# Patient Record
Sex: Female | Born: 1945 | Race: Black or African American | Hispanic: No | Marital: Single | Smoking: Former smoker
Health system: Southern US, Community
[De-identification: ages and names within clinical notes are randomized; demographics above are authoritative.]

## PROBLEM LIST (undated history)

## (undated) DIAGNOSIS — N189 Chronic kidney disease, unspecified: Secondary | ICD-10-CM

## (undated) DIAGNOSIS — E785 Hyperlipidemia, unspecified: Secondary | ICD-10-CM

## (undated) DIAGNOSIS — IMO0001 Reserved for inherently not codable concepts without codable children: Secondary | ICD-10-CM

## (undated) DIAGNOSIS — Z9889 Other specified postprocedural states: Secondary | ICD-10-CM

## (undated) DIAGNOSIS — E0590 Thyrotoxicosis, unspecified without thyrotoxic crisis or storm: Secondary | ICD-10-CM

## (undated) DIAGNOSIS — R251 Tremor, unspecified: Secondary | ICD-10-CM

## (undated) DIAGNOSIS — Z8042 Family history of malignant neoplasm of prostate: Secondary | ICD-10-CM

## (undated) DIAGNOSIS — Z8041 Family history of malignant neoplasm of ovary: Secondary | ICD-10-CM

## (undated) DIAGNOSIS — J449 Chronic obstructive pulmonary disease, unspecified: Secondary | ICD-10-CM

## (undated) DIAGNOSIS — R112 Nausea with vomiting, unspecified: Secondary | ICD-10-CM

## (undated) DIAGNOSIS — M1990 Unspecified osteoarthritis, unspecified site: Secondary | ICD-10-CM

## (undated) DIAGNOSIS — C801 Malignant (primary) neoplasm, unspecified: Secondary | ICD-10-CM

## (undated) DIAGNOSIS — Z803 Family history of malignant neoplasm of breast: Secondary | ICD-10-CM

## (undated) DIAGNOSIS — I10 Essential (primary) hypertension: Secondary | ICD-10-CM

## (undated) DIAGNOSIS — J189 Pneumonia, unspecified organism: Secondary | ICD-10-CM

## (undated) HISTORY — PX: ROTATOR CUFF REPAIR: SHX139

## (undated) HISTORY — DX: Family history of malignant neoplasm of ovary: Z80.41

## (undated) HISTORY — PX: JOINT REPLACEMENT: SHX530

## (undated) HISTORY — PX: COLONOSCOPY: SHX174

## (undated) HISTORY — DX: Family history of malignant neoplasm of breast: Z80.3

## (undated) HISTORY — PX: KNEE SURGERY: SHX244

## (undated) HISTORY — DX: Chronic obstructive pulmonary disease, unspecified: J44.9

## (undated) HISTORY — DX: Thyrotoxicosis, unspecified without thyrotoxic crisis or storm: E05.90

## (undated) HISTORY — DX: Family history of malignant neoplasm of prostate: Z80.42

## (undated) HISTORY — PX: TOTAL HIP ARTHROPLASTY: SHX124

## (undated) HISTORY — PX: OTHER SURGICAL HISTORY: SHX169

---

## 2000-08-05 ENCOUNTER — Emergency Department (HOSPITAL_COMMUNITY): Admission: EM | Admit: 2000-08-05 | Discharge: 2000-08-05 | Payer: Self-pay | Admitting: *Deleted

## 2000-08-05 ENCOUNTER — Encounter: Payer: Self-pay | Admitting: Emergency Medicine

## 2000-08-23 ENCOUNTER — Ambulatory Visit (HOSPITAL_BASED_OUTPATIENT_CLINIC_OR_DEPARTMENT_OTHER): Admission: RE | Admit: 2000-08-23 | Discharge: 2000-08-23 | Payer: Self-pay | Admitting: Orthopedic Surgery

## 2000-10-25 ENCOUNTER — Ambulatory Visit (HOSPITAL_BASED_OUTPATIENT_CLINIC_OR_DEPARTMENT_OTHER): Admission: RE | Admit: 2000-10-25 | Discharge: 2000-10-25 | Payer: Self-pay | Admitting: Orthopedic Surgery

## 2000-12-18 ENCOUNTER — Other Ambulatory Visit: Admission: RE | Admit: 2000-12-18 | Discharge: 2000-12-18 | Payer: Self-pay | Admitting: Family Medicine

## 2000-12-24 ENCOUNTER — Encounter: Admission: RE | Admit: 2000-12-24 | Discharge: 2001-03-24 | Payer: Self-pay | Admitting: Orthopedic Surgery

## 2001-03-19 ENCOUNTER — Encounter: Payer: Self-pay | Admitting: Orthopedic Surgery

## 2001-03-21 ENCOUNTER — Inpatient Hospital Stay (HOSPITAL_COMMUNITY): Admission: RE | Admit: 2001-03-21 | Discharge: 2001-03-26 | Payer: Self-pay | Admitting: Orthopedic Surgery

## 2001-03-21 ENCOUNTER — Encounter: Payer: Self-pay | Admitting: Orthopedic Surgery

## 2001-05-03 ENCOUNTER — Encounter: Admission: RE | Admit: 2001-05-03 | Discharge: 2001-08-01 | Payer: Self-pay | Admitting: Orthopedic Surgery

## 2001-12-29 ENCOUNTER — Emergency Department (HOSPITAL_COMMUNITY): Admission: EM | Admit: 2001-12-29 | Discharge: 2001-12-29 | Payer: Self-pay | Admitting: Emergency Medicine

## 2002-05-29 ENCOUNTER — Ambulatory Visit (HOSPITAL_BASED_OUTPATIENT_CLINIC_OR_DEPARTMENT_OTHER): Admission: RE | Admit: 2002-05-29 | Discharge: 2002-05-30 | Payer: Self-pay | Admitting: Orthopedic Surgery

## 2002-07-18 ENCOUNTER — Encounter: Admission: RE | Admit: 2002-07-18 | Discharge: 2002-09-19 | Payer: Self-pay | Admitting: Orthopedic Surgery

## 2003-05-14 ENCOUNTER — Ambulatory Visit (HOSPITAL_BASED_OUTPATIENT_CLINIC_OR_DEPARTMENT_OTHER): Admission: RE | Admit: 2003-05-14 | Discharge: 2003-05-14 | Payer: Self-pay | Admitting: Orthopedic Surgery

## 2003-05-14 ENCOUNTER — Ambulatory Visit (HOSPITAL_COMMUNITY): Admission: RE | Admit: 2003-05-14 | Discharge: 2003-05-14 | Payer: Self-pay | Admitting: Orthopedic Surgery

## 2004-12-26 ENCOUNTER — Ambulatory Visit: Payer: Self-pay | Admitting: Gastroenterology

## 2005-01-06 ENCOUNTER — Ambulatory Visit: Payer: Self-pay | Admitting: Gastroenterology

## 2005-06-14 ENCOUNTER — Inpatient Hospital Stay (HOSPITAL_COMMUNITY): Admission: RE | Admit: 2005-06-14 | Discharge: 2005-06-19 | Payer: Self-pay | Admitting: Orthopedic Surgery

## 2005-07-18 ENCOUNTER — Encounter: Admission: RE | Admit: 2005-07-18 | Discharge: 2005-09-29 | Payer: Self-pay | Admitting: Orthopedic Surgery

## 2005-08-22 ENCOUNTER — Emergency Department (HOSPITAL_COMMUNITY): Admission: EM | Admit: 2005-08-22 | Discharge: 2005-08-23 | Payer: Self-pay | Admitting: Emergency Medicine

## 2007-08-12 ENCOUNTER — Emergency Department (HOSPITAL_COMMUNITY): Admission: EM | Admit: 2007-08-12 | Discharge: 2007-08-13 | Payer: Self-pay | Admitting: Emergency Medicine

## 2009-12-21 ENCOUNTER — Emergency Department (HOSPITAL_COMMUNITY): Admission: EM | Admit: 2009-12-21 | Discharge: 2009-12-21 | Payer: Self-pay | Admitting: Emergency Medicine

## 2010-05-22 ENCOUNTER — Encounter: Payer: Self-pay | Admitting: Family Medicine

## 2010-09-16 NOTE — Op Note (Signed)
NAME:  Catherine Lowery, REHFELDT NO.:  [id number]   MEDICAL RECORD NO.:  [id number]                   PATIENT TYPE:  AMB   LOCATION:  DSC                                  FACILITY:  [facility]   PHYSICIAN:  Loreta Ave, M.D.              DATE OF BIRTH:  [birth year]   DATE OF PROCEDURE:  05/29/2002  DATE OF DISCHARGE:                                 OPERATIVE REPORT   PREOPERATIVE DIAGNOSES:  Chronic impingement, degenerative joint disease  acromioclavicular joint and complete rotator cuff tear, right shoulder.   POSTOPERATIVE DIAGNOSES:  Chronic impingement, degenerative joint disease  acromioclavicular joint and complete rotator cuff tear, right shoulder.   OPERATION PERFORMED:  Right shoulder examination under anesthesia,  arthroscopy.  Acromioplasty with coracoacromial ligament release.  Excision  of distal clavicle.  Mini open repair rotator cuff tear with FibreWire  suturing and Concept repair system.   SURGEON:  Loreta Ave, M.D.   ASSISTANT:  Arlys John D. Petrarca, P.A.-C.   ANESTHESIA:  General.   ESTIMATED BLOOD LOSS:  Minimal.   SPECIMENS:  None.   CULTURES:  None.   COMPLICATIONS:  None.   DRESSING:  Soft compressive with shoulder immobilizer.   DESCRIPTION OF PROCEDURE:  The patient was brought to the operating room and  placed on the operating table in supine position.  After adequate anesthesia  had been obtained, the right shoulder examined.  Full motion, good  stability.  Placed in beach chair position on the shoulder positioner.  Prepped and draped in the usual sterile fashion.  Three standard  arthroscopic portals, anterior, posterior, lateral.  Shoulder entered with  blunt obturator, distended and inspected.  Some grade 2 changes on the  glenoid, none extreme.  The remaining articular cartilage, labrum, capsular  ligamentous structures intact.  Biceps tendon, biceps anchor intact.  Complete full thickness chronic tear,  supraspinatus, front to back.  About 1  cm retraction but very mobile and reparable.  Cannula redirected  subacromially.  Type 3 acromion.  Confirmation full thickness reparable  tear.  Acromioplasty to a type 1 acromion with shaver and a high speed bur  releasing CA ligament.  Distal clavicle marked spurring and grade 4 changes.  Lateral 1 cm resected.  Adequacy of decompression and clavicle incision  confirmed viewing from all portals.  Instruments and fluid removed.  Deltoid  splitting incision through the lateral portal exposing subacromial space.  Adequacy of decompression confirmed digitally.  Cuff was mobilized and well  captured with #2 FibreWire sutures times two.  Bony troughs created in the  humerus at the attachment site.  Sutures weaved through drill holes with the  Concept repair system and then firmly over a bony bridge.  A nice firm  watertight closure without undue tension even through full motion.  Wound  irrigated.  Deltoid closed with Vicryl.  Skin and subcutaneous tissue with  Vicryl.  Portals closed with  nylon.  Margins of the wound injected with  Marcaine as was the shoulder and subacromial space.  Sterile compressive  dressing with shoulder immobilizer applied.  Anesthesia reversed.  Brought  to recovery room.  Tolerated surgery well without complication.                                                Loreta Ave, M.D.    DFM/MEDQ  D:  05/29/2002  T:  05/29/2002  Job:  [id number]

## 2010-09-16 NOTE — Discharge Summary (Signed)
[place]. [facility]  Patient:    Catherine Lowery, HUEBSCH Visit Number: [id number] MRN: [id number]          Service Type: SUR Location: [id number] Attending Physician:  Colbert Ewing Dictated by:   Oris Drone Petrarca, P.A.-C. 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  DISCHARGE DIAGNOSES: 1. 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 2. Hypokalemia. 3. Postoperative hemorrhagic anemia. 4. Cigarette smoker.  PROCEDURE:  Left total knee replacement.  HISTORY OF PRESENT ILLNESS:  A 65 year old female with persistent pain in her left knee.  She has been unable to work and perform activities of daily living secondary to her pain.  She now has pain with every step and nighttime pain. She has failed conservative treatment.  She is now indicated for a total knee replacement.  HOSPITAL COURSE:  A 65 year old female admitted March 21, 2001, and after appropriate laboratory studies were obtained as well as 1 g Ancef IV on call to the operating room, was taken to the operating room where she underwent a left total knee replacement.  She tolerated the procedure well.  She was continued with Ancef 1 g IV q.8h. x 3 doses.  Heparin 5000 units subcu q.12h. was begun until her Coumadin became therapeutic.  Consultations with PT and OT were obtained.  Total knee replacement protocol procedures were instituted. She was allowed to ambulate weightbearing as tolerated on the left. Apparently she had difficulty with the epidural and had pump tubing connection had disconnected.  This was then discontinued.  She was placed on Dilaudid PCA pump.  She was allowed out of bed to a chair the following day.  Hemovacs were pulled postop day #2.  She had hypokalemia which was treated wit h K-Dur 20 mEq p.o. b.i.d.  She became ambulatory,  weightbearing as tolerated as per physical therapy.  Once she was ambulatory and stable, she was discharged on March 26, 2001, to return back to the office in seven to 10 days for follow-up.  LABORATORY STUDIES:  March 19, 2001, hemoglobin 13.1, hematocrit 40.1%, white count 5300, platelets 253,000.  Labs of March 26, 2001, hemoglobin 8.4, hematocrit 25.6%, white count 6800, platelets 264,000.  Chemistries of March 19, 2001, revealed sodium 137, potassium 4.1, chloride 104, CO2 28, glucose 89, BUN 8, creatinine 0.7, calcium 9.2, total protein 7.3, albumin 4.1, AST 19, ALT 14, ALP 88, total bilirubin 0.8.  Chemistries of March 26, 2001, revealed sodium 137, potassium 4.3, chloride 103, CO2 27, glucose 105, BUN 7, creatinine 0.7, and calcium 8.5.  Urinalysis was benign for voided urine. She is blood type O positive, antibody screen negative.  DISCHARGE INSTRUCTIONS:  She was discharged in good condition.  She was given a prescription for Coumadin 5 mg as directed by pharmacy at [place].  Percocet one to two q.4h. p.r.n. pain.  Iron sulfate 325 mg one p.o. b.i.d. with meals. Activities as tolerated, ambulating weightbearing as tolerated.  No restrictions on diet.  Keep wound clean and dry.  Call if she has any problems with signs of infection, temperature, or any other difficulties.  She will otherwise follow-up in the office in seven to 10 days for follow-up.  She is discharged in improved condition. Dictated by:   Oris Drone Petrarca,  P.A.-C. Attending Physician:  Colbert Ewing DD:  04/04/01 TD:  04/04/01 Job: [id number]

## 2010-09-16 NOTE — Op Note (Signed)
[name], Lowery NO.:  [id number]   MEDICAL RECORD NO.:  [id number]          PATIENT TYPE:  INP   LOCATION:  2550                         FACILITY:  [facility]   PHYSICIAN:  Loreta Ave, M.D. DATE OF BIRTH:  Sep 23, 1945   DATE OF PROCEDURE:  06/14/2005  DATE OF DISCHARGE:                                 OPERATIVE REPORT   PREOPERATIVE DIAGNOSIS:  End stage degenerative arthritis, right knee with  valgus alignment.   POSTOPERATIVE DIAGNOSIS:  End stage degenerative arthritis, right knee with  valgus alignment.   OPERATION PERFORMED:  Right total knee replacement, [employer] prosthesis.  Minimally invasive system.  Cemented pegged #5  posterior stabilized femoral component.  Cemented #5 tibial component with  11 mm posterior stabilized polyethylene insert.  Cemented resurfacing medial  offset 35 mm x 10 mm patellar component.   SURGEON:  Loreta Ave, M.D.   ASSISTANT:  Genene Churn. Denton Meek.   ANESTHESIA:  General.   ESTIMATED BLOOD LOSS:  Minimal.   TOURNIQUET TIME:  One hour and 40 minutes.   SPECIMENS:  None.   CULTURES:  None.   COMPLICATIONS:  None.   DRESSING:  Soft compressive with knee immobilizer.   DRAINS:  Hemovac x 1.   DESCRIPTION OF PROCEDURE:  The patient was brought to the operating room and  after adequate anesthesia had been obtained, the left knee examined.  Minimal flexion contracture.  Increased valgus correctable to reasonable  mechanical axis.  Stable ligaments.  Flexion to greater than 100 degrees.  Tourniquet applied.  Leg prepped and draped in the usual sterile fashion.  Exsanguinated with elevation and Esmarch.  Tourniquet inflated to 300 mmHg.  Straight incision above the patella down to the tibial tubercle.  Medial  arthrotomy to the top of the patella and then a vastus splitting approach  for the MIS instruments.  Knee exposed.  Marked grade 4 changes throughout.  Periarticular spurs, loose  bodies, remnants of menisci, cruciate ligaments  excised.  Distal femur exposed.  Intramedullary guide placed.  Distal cut 10  mm.  Set at 5 degrees of valgus.  Obviously more resection  medially as she  is deficient laterally.  Sized for #5 component.  Jigs put in place,  definitive cuts made.  Trial put in place, found to fit well with a nice  contour anteriorly and good coverage medial to lateral.  Attention turned to  tibia.  Extramedullary guide.  Proximal cut 3 degree posterior slope cut,  removing about 2 mm off the deficient lateral side.  Sized for #5 component.  Trials put in place,  #5 on the tibia, #5 on the femur, both fit well.  Trials removed.  In the posterolateral aspect of the knee there was a large  __________  with extensive spurring creating posterior impingement.  There  was carefully shelled out and excised.  All posterior spurs removed.  Patella was then sized for a 35 mm resurfacing component.  The posterior 10  mm resected.  Drill holes made for the definitive component.  Trials put in  place throughout.  #  5 on the femur, #5 on the tibia and a 35 mm on the  patella.  With the 11 mm insert after soft tissue balancing, I had a nicely  balanced knee with full extension, full flexion, no lift off in flexion and  good stability throughout.  Tibia was marked for appropriate rotation and  then hand reamed.  All instruments removed.  Copious irrigation with the  pulse irrigating device.  Cement placed on all components which were then  all firmly seated.  Polyethylene was attached to the tibia.  Knee reduced.  Once the cement hardened, the knee was re-examined.  Full extension, full  flexion, good patellofemoral tracking, good stability.  Wound irrigated.  Hemovac placed, brought out through separate stab  wound.  Arthrotomy closed  with #1 Vicryl.  Skin and subcutaneous tissue with Vicryl and staples.  Margins of the wound and knee injected with Marcaine.  Hemovac which  had  been placed was clamped.  Sterile compressive dressing applied.  Tourniquet  deflated and removed.  Knee immobilizer applied.  Anesthesia reversed.  Brought to recovery room.  Tolerated surgery well.  No complications.      Loreta Ave, M.D.  Electronically Signed     DFM/MEDQ  D:  06/14/2005  T:  06/14/2005  Job:  [id number]

## 2010-09-16 NOTE — Op Note (Signed)
[place]. [facility]  Patient:    Catherine Lowery, Catherine Lowery                       MRN: [id number] Proc. Date: 10/25/00 Adm. Date:  [id number] Attending:  Colbert Ewing                           Operative Report  PREOPERATIVE DIAGNOSIS:  Painful posterior superior os calcis spurs left heel with pre-Achilles bursitis and attachment spurs, Achilles tendon.  POSTOPERATIVE DIAGNOSIS:  Painful posterior superior os calcis spurs left heel with pre-Achilles bursitis and attachment spurs, Achilles tendon.  OPERATION PERFORMED:  Excision posterior superior os calcis spurs and pre-Achilles bursa.  Debridement of attachment spurs to Achilles tendon.  SURGEON:  Loreta Ave, M.D.  ASSISTANT:  Arlys John D. Petrarca, P.A.-C.  ANESTHESIA:  General.  ESTIMATED BLOOD LOSS:  Minimal.  TOURNIQUET TIME:  45 minutes.  SPECIMENS:  None.  CULTURES:  None.  COMPLICATIONS:  None.  DRESSING:  Soft compressive with bulky dressing and short leg splint.  DESCRIPTION OF PROCEDURE:  The patient was brought to the operating room and after adequate anesthesia had been obtained, turned to a prone position with a calf tourniquet applied.  Prepped and draped in the usual sterile fashion. Longitudinal incision lateral aspect Achilles tendon down to the lateral border of the os calcis.  Skin and subcutaneous tissues divided.  Pre-Achilles region accessed with an obvious swollen bursa with bursal fluid.  Bursa resected.  Prominent posterior superior os calcis spurs all debrided off and tapered down to a nice even margin confirmed fluoroscopically.  I then went into the subcutaneous plane to the back of the tendon which was opened longitudinally in the middle third.  The spurring within the attachment of the tendon was then excised.  Able to maintain more than two thirds of the tendon attachment and this was closed longitudinally over the site of spur excision with Vicryl.  Wound  irrigated.  Deep tissue closed with Vicryl, skin with nylon.  Margins of the wound injected with Marcaine.  Sterile compressive dressing and short leg splint applied.  Anesthesia reversed.  Brought to recovery room.  Tolerated surgery well.  No complications. DD:  10/25/00 TD:  10/25/00 Job: [id number]

## 2010-09-16 NOTE — Op Note (Signed)
NAMESUPRIYA, BEASTON NO.:  [id number]   MEDICAL RECORD NO.:  [id number]                   PATIENT TYPE:  AMB   LOCATION:  DSC                                  FACILITY:  [facility]   PHYSICIAN:  Loreta Ave, M.D.              DATE OF BIRTH:  02-12-1946   DATE OF PROCEDURE:  05/14/2003  DATE OF DISCHARGE:                                 OPERATIVE REPORT   PREOPERATIVE DIAGNOSIS:  Right knee diffuse tricompartmental degenerative  arthritis with degenerative medial and lateral meniscal tears, loose bodies,  and synovitis.   POSTOPERATIVE DIAGNOSIS:  Right knee diffuse tricompartmental degenerative  arthritis with degenerative medial and lateral meniscal tears, loose bodies,  and synovitis, with diffuse grade 3 chondromalacia all compartments and some  grade 4 changes lateral trochlea.  Two osteochondral loose bodies and  numerous chondral loose bodies.   OPERATIVE PROCEDURE:  Right knee exam under anesthesia, arthroscopy,  chondroplasty all three compartments including patellofemoral joint, removal  loose bodies, extensive partial medial and lateral meniscectomy.   SURGEON:  Loreta Ave, M.D.   ASSISTANT:  Arlys John D. Petrarca, P.A.-C.   ANESTHESIA:  General.   ESTIMATED BLOOD LOSS:  Minimal.   TOURNIQUET TIME:  Not employed.   SPECIMENS:  None.   CULTURES:  None.   COMPLICATIONS:  None.   DRESSINGS:  Soft compressive.   PROCEDURE:  The patient was brought to the operating room and placed on the  operating table in supine position.  After adequate anesthesia had been  obtained, the right knee was examined.  There was some patellofemoral  crepitus but reasonable motion, just about full extension and flexion and  stable ligaments.  Tourniquet and leg holder applied.  The leg was prepped  and draped.  Three portals were created, one superolateral and one medial  and lateral parapatellar.  Inflow catheter introduced, arthroscope  introduced, knee inspected.  Reactive synovitis throughout debrided.  The  patellofemoral joint had reasonable tracking.  Grade 3 changes throughout  treated with chondroplasty.  Some grade 4 on the trochlea.  Lateral release  not indicated.  One 1 cm osteochondral loose body found on the notch  removed.  Another slightly smaller one found in the lateral compartment  removed.  Cruciate ligament intact.  Some attenuation of the ACL, but still  functional.  PCL intact.  Medial compartment with diffuse grade 3 changes  treated with chondroplasty.  Marked complex tearing of the medial meniscus  over the posterior half taken down to a stable rim salvaging some.  The  lateral meniscus has circumferential complex displaced tears that were  saucerized out leaving a little bit of the rim of the meniscus all the way  around at the completion.  At completion, all areas examined, all loose  bodies and all uneven surfaces debrided.  The instruments and fluid were  removed.  The portals of the knee were injected with  Marcaine.  The portals  were closed with 4-0 nylon.  Sterile compressive dressing applied.  Anesthesia reversed.  Brought to the recovery room.  Tolerated the surgery  well without complications.                                               Loreta Ave, M.D.    DFM/MEDQ  D:  05/14/2003  T:  05/14/2003  Job:  [id number]

## 2010-09-16 NOTE — Op Note (Signed)
[place]. [facility]  Patient:    Catherine Lowery, Catherine Lowery                       MRN: [id number] Proc. Date: 08/23/00 Adm. Date:  [id number] Attending:  Colbert Ewing                           Operative Report  PREOPERATIVE DIAGNOSES:  Progressive degenerative arthritis and degenerative meniscal tears left knee.  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  Extensive degenerative changes medial and lateral meniscus with reactive synovitis.  PROCEDURE:  Left knee examination under anesthesia arthroscopy, with partial medial and lateral meniscectomies.  Generalized chondroplasty, partial synovectomy.  SURGEON:  Loreta Ave, M.D.  ANESTHESIA:  General.  ESTIMATED BLOOD LOSS:  Minimal.  SPECIMENS:  None.  CULTURES:  None.  COMPLICATIONS:  None.  DRESSINGS:  Sterile compressive.  PROCEDURE:  The patient brought to the operating room and placed on the operating room table in supine position.  After adequate anesthesia had been obtained the left knee examined.  Good motion, good stability, good patellofemoral tracking after lateral release.  Tourniquet and leg holder applied.  Leg prepped and draped in usual sterile fashion.  Three portals created, one superolateral, one each medial and lateral parapatellar.  Inflow catheter introduced, knee distended, arthroscope introduced, knee inspected.  Extensive grade 3 and 4 chondral changes all 3 compartments.  Treated with chondroplasty throughout smoothing off and evening off on even articular cartilage surfaces.  A third of the patella grade 4.  A good third of the medial and lateral compartments also down to grade 4 with extensive grade 3 changes.  Extensive degenerative tearing posterior half medial meniscus which was saucerized out to a stable rim, taped to remaining  meniscus obviously in the anterior half.  Lateral meniscus had already had the posterior half removed and the anterior half had extensive degenerative tearing and this was removed.  Hypertrophic synovitis throughout the knee, all debrided.  Chondral lose bodies removed.  Cruciate ligaments had some attrition but were still intact.  Entire knee examined, no other significant findings appreciated. Patellofemoral tracking felt to be acceptable after previous release. Instruments and fluid removed.  Portals of the knee injected with Marcaine. Portals closed with 4-0 nylon.  Sterile compressive dressing applied. Anesthesia reversed and brought to recovery room.  Tolerated surgery well, no complications. DD:  08/23/00 TD:  08/24/00 Job: [id number]

## 2010-09-16 NOTE — Op Note (Signed)
[place]. [facility]  Patient:    Catherine Lowery Visit Number: [id number] MRN: [id number]          Service Type: SUR Location: 5000 [id number] Attending Physician:  Catherine Lowery Dictated by:   Catherine Lowery, M.D. Proc. Date: 03/21/01 Admit Date:  03/21/2001                             Operative Report  PREOPERATIVE DIAGNOSIS:  End stage degenerative arthritis, left knee.  POSTOPERATIVE DIAGNOSIS:  End stage degenerative arthritis, left knee.  OPERATION PERFORMED:  Left total knee replacement, [employer] prosthesis. Press-fit #9 cruciate retaining femoral component.  Cemented #9 tibial component with 10 mm polyethylene insert.  Cemented recessed nonmetalbacked 28 mm patellar component.  Appropriate soft tissue balancing with lateral retinacular release.  SURGEON:  Catherine Lowery, M.D.  ASSISTANT:  Catherine Lowery, P.A.-C.  ANESTHESIA:  General.  ESTIMATED BLOOD LOSS:  Minimal.  TOURNIQUET TIME:  One hour 10 minutes.  SPECIMENS:  Excised bone and soft tissue.  CULTURES:  None.  COMPLICATIONS:  None.  DRESSING:  Soft compressive with knee immobilizer.  DESCRIPTION OF PROCEDURE:  The patient was brought to the operating room and placed on the operating table in supine position.  After adequate anesthesia had been obtained, tourniquet applied to upper aspect of the left leg. prepped and draped in the usual sterile fashion.  Exsanguinated with elevation and Esmarch.  Tourniquet inflated to 350 mmHg.  Alignment in slight increased valgus.  Straight ligaments.  Full extension about 95 degrees of flexion. Straight incision above the patella down to the tibial tubercle.  The skin and subcutaneous tissues were divided.  Medial parapatellar arthrotomy with hemostasis obtained with cautery.  Knee exposed, patella everted.  Remnants of menisci excised.  Grade 4 changes throughout, especially laterally.  PCL retained as it was not  contracted.  ACL resected.  Articular spurs removed. Intramedullary guide distal femur.  Distal cuts set at 5 degrees of valgus resecting 12 mm.  Sized to #9 component.  Jigs put in place.  Definitive cuts made.  Trial put in place and found to fit well.  Trial removed.  Tibia exposed.  Tibial spines removed with a saw protecting PCL.  Intramedullary guide placed.  Proximal cut removing 6 mm with a posterior slope cut. Periarticular spurs and loose bodies removed.  Trials put in place with the #9 component above and below.  With a 10 mm insert, full extension, full flexion, nicely balanced knee and no lift-off with flexion.  Marked for rotation and then the tibia hand reamed.  Patella sized, reamed and drilled for a 28 mm component.  All trials put in place.  Good motion and stability.  Lateral release necessary to balance patellofemoral joint.  Performed with cautery from inside out.  All trials removed.  Copious irrigation with the pulse irrigating device.  Cement prepared and placed on tibial and patellar components which were both well-seated with excessive cement removed. Polyethylene attached to the tibia.  Femoral component seated.  Knee reduced. After the cement had hardened, the knee was re-examined.  Good motion, good stability, full extension, full flexion set at 5 degrees of valgus and good patellofemoral tracking.  Hemovacs placed and brought out through separate stab wounds.  Arthrotomy closed with #1 Vicryl.  Skin and subcutaneous tissues with Vicryl and staples.  Margins of the wound and knee injected with Marcaine and Hemovacs clamped.  Sterile compressive dressing applied.  Tourniquet inflated and removed.  Anesthesia reversed.  Brought to recovery room. Tolerated surgery well.  No complications. Skin and subcutaneous tissues with Vicryl.  Portals closed with nylon. Margins of wound injected Marcaine.  Sterile compressive dressing and shoulder immobilizer applied.   Anesthesia reversed.  Brought to recovery room. Tolerated surgery well.  No complications. Dictated by:   Catherine Lowery, M.D. Attending Physician:  Catherine Lowery DD:  03/21/01 TD:  03/22/01 Job: [id number]

## 2011-03-24 ENCOUNTER — Encounter: Payer: Self-pay | Admitting: Emergency Medicine

## 2011-03-24 ENCOUNTER — Emergency Department (HOSPITAL_COMMUNITY): Payer: Medicare Other

## 2011-03-24 ENCOUNTER — Emergency Department (HOSPITAL_COMMUNITY)
Admission: EM | Admit: 2011-03-24 | Discharge: 2011-03-24 | Disposition: A | Discharge status: Home / Self Care | Payer: Medicare Other | Attending: Emergency Medicine | Admitting: Emergency Medicine

## 2011-03-24 DIAGNOSIS — R0602 Shortness of breath: Secondary | ICD-10-CM | POA: Insufficient documentation

## 2011-03-24 DIAGNOSIS — R6883 Chills (without fever): Secondary | ICD-10-CM | POA: Insufficient documentation

## 2011-03-24 DIAGNOSIS — N390 Urinary tract infection, site not specified: Secondary | ICD-10-CM | POA: Insufficient documentation

## 2011-03-24 DIAGNOSIS — Z79899 Other long term (current) drug therapy: Secondary | ICD-10-CM | POA: Insufficient documentation

## 2011-03-24 DIAGNOSIS — J40 Bronchitis, not specified as acute or chronic: Secondary | ICD-10-CM | POA: Insufficient documentation

## 2011-03-24 DIAGNOSIS — R51 Headache: Secondary | ICD-10-CM | POA: Insufficient documentation

## 2011-03-24 DIAGNOSIS — R05 Cough: Secondary | ICD-10-CM | POA: Insufficient documentation

## 2011-03-24 DIAGNOSIS — I10 Essential (primary) hypertension: Secondary | ICD-10-CM | POA: Insufficient documentation

## 2011-03-24 DIAGNOSIS — R059 Cough, unspecified: Secondary | ICD-10-CM | POA: Insufficient documentation

## 2011-03-24 DIAGNOSIS — R070 Pain in throat: Secondary | ICD-10-CM | POA: Insufficient documentation

## 2011-03-24 DIAGNOSIS — J3489 Other specified disorders of nose and nasal sinuses: Secondary | ICD-10-CM | POA: Insufficient documentation

## 2011-03-24 DIAGNOSIS — IMO0001 Reserved for inherently not codable concepts without codable children: Secondary | ICD-10-CM | POA: Insufficient documentation

## 2011-03-24 DIAGNOSIS — M2550 Pain in unspecified joint: Secondary | ICD-10-CM | POA: Insufficient documentation

## 2011-03-24 DIAGNOSIS — R079 Chest pain, unspecified: Secondary | ICD-10-CM | POA: Insufficient documentation

## 2011-03-24 DIAGNOSIS — Z7982 Long term (current) use of aspirin: Secondary | ICD-10-CM | POA: Insufficient documentation

## 2011-03-24 HISTORY — DX: Essential (primary) hypertension: I10

## 2011-03-24 HISTORY — DX: Tremor, unspecified: R25.1

## 2011-03-24 LAB — URINALYSIS, ROUTINE W REFLEX MICROSCOPIC
Ketones, ur: NEGATIVE mg/dL
Protein, ur: NEGATIVE mg/dL
Specific Gravity, Urine: 1.019 (ref 1.005–1.030)
Urobilinogen, UA: 0.2 mg/dL (ref 0.0–1.0)

## 2011-03-24 LAB — URINE MICROSCOPIC-ADD ON

## 2011-03-24 MED ORDER — ALBUTEROL SULFATE HFA 108 (90 BASE) MCG/ACT IN AERS
2.0000 | INHALATION_SPRAY | Freq: Once | RESPIRATORY_TRACT | Status: AC
  Administered 2011-03-24: 2 via RESPIRATORY_TRACT
  Filled 2011-03-24: qty 6.7

## 2011-03-24 MED ORDER — CIPROFLOXACIN HCL 500 MG PO TABS: 500.0000 mg | ORAL_TABLET | Freq: Two times a day (BID) | ORAL | Status: AC

## 2011-03-24 MED ORDER — HYDROCOD POLST-CHLORPHEN POLST 10-8 MG/5ML PO LQCR: 5.0000 mL | Freq: Two times a day (BID) | ORAL | Status: DC | PRN

## 2011-03-24 NOTE — ED Provider Notes (Signed)
Medical screening examination/treatment/procedure(s) were conducted as a shared visit with non-physician practitioner(s) and myself.  I personally evaluated the patient during the encounter   Patient seen and examined. Patient to be treated for her urinary tract infection and followup with her Dr. as needed  Catherine Baker, MD 03/24/11 1140

## 2011-03-24 NOTE — ED Notes (Signed)
Pt here c/o generalized body aches, HA, hip pain, sinus congestion, and SOB

## 2011-03-24 NOTE — ED Provider Notes (Signed)
History     CSN: [id number] Arrival date & time: 03/24/2011  8:14 AM   First MD Initiated Contact with Patient 03/24/11 [phone]      Chief Complaint  Patient presents with  . Generalized Body Aches  . Headache    (Consider location/radiation/quality/duration/timing/severity/associated sxs/prior treatment) Patient is a 65 y.o. female presenting with URI. The history is provided by the patient.  URI The primary symptoms include headaches, sore throat, cough, myalgias and arthralgias. Primary symptoms do not include fever, ear pain, abdominal pain, nausea, vomiting or rash. The current episode started 3 to 5 days ago. This is a new problem. The problem has not changed since onset. Symptoms associated with the illness include chills, plugged ear sensation, sinus pressure, congestion and rhinorrhea. The illness is not associated with facial pain.  Pt states she has had nasal congestion, sore throat, cough for the last 4 days. States also having headaches. States tried over the counter cold and flu medications with no relief. Denies fever, chest pain. States now feeling short of breath. States is having generalized body aches, especially in her hips. No other complaints.   Past Medical History  Diagnosis Date  . Hypertension   . Tremors of nervous system     Past Surgical History  Procedure Date  . Joint replacement     No family history on file.  History  Substance Use Topics  . Smoking status: Not on file  . Smokeless tobacco: Not on file  . Alcohol Use:     OB History    Grav Para Term Preterm Abortions TAB SAB Ect Mult Living                  Review of Systems  Constitutional: Positive for chills. Negative for fever.  HENT: Positive for congestion, sore throat, rhinorrhea and sinus pressure. Negative for ear pain.   Eyes: Negative.   Respiratory: Positive for cough and shortness of breath. Negative for chest tightness.   Cardiovascular: Negative for chest pain,  palpitations and leg swelling.  Gastrointestinal: Negative for nausea, vomiting and abdominal pain.  Genitourinary: Negative.   Musculoskeletal: Positive for myalgias and arthralgias.  Skin: Negative for rash.  Neurological: Positive for headaches.  Psychiatric/Behavioral: Negative.     Allergies  Morphine and related  Home Medications   Current Outpatient Rx  Name Route Sig Dispense Refill  . ASPIRIN 81 MG PO TABS Oral Take 81 mg by mouth daily.      Marland Kitchen PROPRANOLOL HCL 80 MG PO TABS Oral Take 80 mg by mouth 2 (two) times daily.        There were no vitals taken for this visit.  Physical Exam  Constitutional: She is oriented to person, place, and time. She appears well-developed and well-nourished. No distress.  HENT:  Head: Normocephalic and atraumatic.  Right Ear: Tympanic membrane, external ear and ear canal normal.  Left Ear: Tympanic membrane, external ear and ear canal normal.  Nose: Rhinorrhea present. No mucosal edema.  Mouth/Throat: Uvula is midline, oropharynx is clear and moist and mucous membranes are normal. No oropharyngeal exudate, posterior oropharyngeal edema or posterior oropharyngeal erythema.  Eyes: Pupils are equal, round, and reactive to light.  Neck: Neck supple.  Cardiovascular: Normal rate, regular rhythm and normal heart sounds.   Pulmonary/Chest: Effort normal and breath sounds normal. No respiratory distress. She has no wheezes.  Abdominal: Soft. Bowel sounds are normal.  Musculoskeletal: She exhibits no edema.  Neurological: She is alert and oriented to person,  place, and time.  Skin: Skin is warm and dry. No erythema.  Psychiatric: She has a normal mood and affect.    ED Course  Procedures (including critical care time)  Results for orders placed during the hospital encounter of 03/24/11  URINE CULTURE      Component Value Range   Specimen Description URINE, CLEAN CATCH     Special Requests NONE     Setup Time [id number]     Colony  Count 2,000 COLONIES/ML     Culture INSIGNIFICANT GROWTH     Report Status 03/25/2011 FINAL    URINALYSIS, ROUTINE W REFLEX MICROSCOPIC      Component Value Range   Color, Urine YELLOW  YELLOW    Appearance HAZY (*) CLEAR    Specific Gravity, Urine 1.019  1.005 - 1.030    pH 5.5  5.0 - 8.0    Glucose, UA NEGATIVE  NEGATIVE (mg/dL)   Hgb urine dipstick MODERATE (*) NEGATIVE    Bilirubin Urine SMALL (*) NEGATIVE    Ketones, ur NEGATIVE  NEGATIVE (mg/dL)   Protein, ur NEGATIVE  NEGATIVE (mg/dL)   Urobilinogen, UA 0.2  0.0 - 1.0 (mg/dL)   Nitrite NEGATIVE  NEGATIVE    Leukocytes, UA SMALL (*) NEGATIVE   URINE MICROSCOPIC-ADD ON      Component Value Range   Squamous Epithelial / LPF FEW (*) RARE    WBC, UA 3-6  <3 (WBC/hpf)   RBC / HPF 7-10  <3 (RBC/hpf)   Bacteria, UA FEW (*) RARE    Dg Chest 2 View  03/24/2011  *RADIOLOGY REPORT*  Clinical Data: Cough.  Chest pain.  CHEST - 2 VIEW  Comparison: PA and lateral chest 08/22/2005.  Findings: The lungs are clear.  There is no pneumothorax or pleural effusion.  Heart size is normal.  No focal bony abnormality with spondylosis noted.  IMPRESSION: No acute disease.  Original Report Authenticated By: Bernadene Bell. Maricela Curet, M.D.    Pt in NAD. CXR negative. URI symptoms. VS stable. UA possibly infected. Will treat. Pt wanting to go home. Will d/c home with close follow up     MDM          Lottie Mussel, PA 03/25/11 1623

## 2011-03-25 LAB — URINE CULTURE: Colony Count: 2000

## 2011-03-28 NOTE — ED Provider Notes (Signed)
Medical screening examination/treatment/procedure(s) were performed by non-physician practitioner and as supervising physician I was immediately available for consultation/collaboration.  Toy Baker, MD 03/28/11 2138

## 2012-01-24 ENCOUNTER — Encounter (HOSPITAL_COMMUNITY): Payer: Self-pay | Admitting: Emergency Medicine

## 2012-01-24 ENCOUNTER — Emergency Department (HOSPITAL_COMMUNITY)
Admission: EM | Admit: 2012-01-24 | Discharge: 2012-01-24 | Disposition: A | Discharge status: Home / Self Care | Payer: Medicare Other | Attending: Emergency Medicine | Admitting: Emergency Medicine

## 2012-01-24 DIAGNOSIS — L0291 Cutaneous abscess, unspecified: Secondary | ICD-10-CM

## 2012-01-24 DIAGNOSIS — IMO0002 Reserved for concepts with insufficient information to code with codable children: Secondary | ICD-10-CM | POA: Insufficient documentation

## 2012-01-24 DIAGNOSIS — Z7982 Long term (current) use of aspirin: Secondary | ICD-10-CM | POA: Insufficient documentation

## 2012-01-24 DIAGNOSIS — F17200 Nicotine dependence, unspecified, uncomplicated: Secondary | ICD-10-CM | POA: Insufficient documentation

## 2012-01-24 DIAGNOSIS — I10 Essential (primary) hypertension: Secondary | ICD-10-CM | POA: Insufficient documentation

## 2012-01-24 DIAGNOSIS — Z885 Allergy status to narcotic agent status: Secondary | ICD-10-CM | POA: Insufficient documentation

## 2012-01-24 MED ORDER — SULFAMETHOXAZOLE-TRIMETHOPRIM 800-160 MG PO TABS: 1.0000 | ORAL_TABLET | Freq: Two times a day (BID) | ORAL | Status: DC

## 2012-01-24 NOTE — ED Provider Notes (Signed)
History     CSN: [id number]  Arrival date & time 01/24/12  1736   First MD Initiated Contact with Patient 01/24/12 1842      Chief Complaint  Patient presents with  . Abscess    (Consider location/radiation/quality/duration/timing/severity/associated sxs/prior treatment) HPI Comments: Patient with history of recurrent abscesses presents with complaint of multiple abscesses located in her left axilla and shoulder. They first appeared on Sunday (3 days ago) and have been increasing in size since then. The abscess on her left shoulder has been irritated by her bra strap prompting her to put neosporin and a band-aid on it. The neosporin has not seemed to help.The abscess has been draining. No drainage of the abscesses in axilla. Patient denies fever, nausea, vomiting, diarrhea. Onset gradual. Course constant. Nothing makes symptoms better.   Patient is a 66 y.o. female presenting with abscess. The history is provided by the patient.  Abscess  Pertinent negatives include no fever, no diarrhea and no vomiting.    Past Medical History  Diagnosis Date  . Hypertension   . Tremors of nervous system     Past Surgical History  Procedure Date  . Joint replacement     History reviewed. No pertinent family history.  History  Substance Use Topics  . Smoking status: Current Every Day Smoker -- 0.5 packs/day  . Smokeless tobacco: Not on file  . Alcohol Use: No    OB History    Grav Para Term Preterm Abortions TAB SAB Ect Mult Living                  Review of Systems  Constitutional: Negative for fever.  Gastrointestinal: Negative for nausea, vomiting and diarrhea.  Skin: Positive for color change. Negative for wound.       Positive for abscess.  Hematological: Negative for adenopathy.    Allergies  Morphine and related  Home Medications   Current Outpatient Rx  Name Route Sig Dispense Refill  . ASPIRIN 325 MG PO TABS Oral Take 325 mg by mouth daily.    .  BUDESONIDE-FORMOTEROL FUMARATE 160-4.5 MCG/ACT IN AERO Inhalation Inhale 2 puffs into the lungs at bedtime.    Marland Kitchen PROPRANOLOL HCL 80 MG PO TABS Oral Take 80 mg by mouth 2 (two) times daily.      Marland Kitchen VITAMIN D (ERGOCALCIFEROL) 50000 UNITS PO CAPS Oral Take 50,000 Units by mouth every 7 (seven) days. sundays      BP 153/65  Pulse 60  Temp 98.2 F (36.8 C)  SpO2 97%  LMP 01/24/2000  Physical Exam  Nursing note and vitals reviewed. Constitutional: She appears well-developed and well-nourished.  HENT:  Head: Normocephalic and atraumatic.  Eyes: Conjunctivae normal are normal.  Neck: Normal range of motion. Neck supple.  Pulmonary/Chest: No respiratory distress.  Neurological: She is alert.  Skin: Skin is warm and dry.       2 cm diameter abscess of the left anterior shoulder, gaping, small amount of discharge noted. Mild tenderness. There is redness in the area where her bra strap is overlying. Multiple, small, less than 1 cm, nonerythematous boils in left axilla. None are draining. Mild tenderness.  Psychiatric: She has a normal mood and affect.    ED Course  Procedures (including critical care time)  Labs Reviewed - No data to display No results found.   1. Abscess     7:29 PM Patient seen and examined. Will treat areas with antibiotics. Patient counseled on use of warm compresses. Urged primary  care physician followup or return if no improvement in 2-3 days. Patient verbalizes understanding and agrees with plan.    Vital signs reviewed and are as follows: Filed Vitals:   01/24/12 1828  BP: 153/65  Pulse: 60  Temp: 98.2 F (36.8 C)      MDM  Patient with multiple abscesses. No systemic symptoms of illness. Abscess on anterior shoulder is open and draining. Do not think that further incision and drainage would improve healing in this area. Areas in the left axilla are too small to be drained. Will give course of Bactrim and patient to continue conservative  management.       Renne Crigler, [place] 01/24/12 1930

## 2012-01-24 NOTE — ED Provider Notes (Signed)
Medical screening examination/treatment/procedure(s) were performed by non-physician practitioner and as supervising physician I was immediately available for consultation/collaboration.  Donnetta Hutching, MD 01/24/12 2234

## 2012-01-24 NOTE — ED Notes (Signed)
Pt presents w/ boils to shoulder and under the arm.

## 2013-01-23 ENCOUNTER — Encounter: Payer: Self-pay | Admitting: Physician Assistant

## 2013-09-08 ENCOUNTER — Encounter (HOSPITAL_COMMUNITY): Payer: Self-pay | Admitting: Emergency Medicine

## 2013-09-08 ENCOUNTER — Emergency Department (HOSPITAL_COMMUNITY)
Admission: EM | Admit: 2013-09-08 | Discharge: 2013-09-08 | Disposition: A | Discharge status: Home / Self Care | Payer: Medicare FFS | Attending: Emergency Medicine | Admitting: Emergency Medicine

## 2013-09-08 ENCOUNTER — Emergency Department (HOSPITAL_COMMUNITY)
Admission: EM | Admit: 2013-09-08 | Discharge: 2013-09-08 | Disposition: A | Discharge status: Nursing Home (Includes ICF) | Payer: Medicare FFS | Source: Home / Self Care | Attending: Family Medicine | Admitting: Family Medicine

## 2013-09-08 ENCOUNTER — Emergency Department (HOSPITAL_COMMUNITY): Payer: Medicare FFS

## 2013-09-08 DIAGNOSIS — Z792 Long term (current) use of antibiotics: Secondary | ICD-10-CM | POA: Insufficient documentation

## 2013-09-08 DIAGNOSIS — Z7982 Long term (current) use of aspirin: Secondary | ICD-10-CM | POA: Insufficient documentation

## 2013-09-08 DIAGNOSIS — R079 Chest pain, unspecified: Secondary | ICD-10-CM

## 2013-09-08 DIAGNOSIS — I10 Essential (primary) hypertension: Secondary | ICD-10-CM | POA: Insufficient documentation

## 2013-09-08 DIAGNOSIS — Z79899 Other long term (current) drug therapy: Secondary | ICD-10-CM | POA: Insufficient documentation

## 2013-09-08 DIAGNOSIS — E785 Hyperlipidemia, unspecified: Secondary | ICD-10-CM | POA: Insufficient documentation

## 2013-09-08 DIAGNOSIS — F17200 Nicotine dependence, unspecified, uncomplicated: Secondary | ICD-10-CM | POA: Insufficient documentation

## 2013-09-08 HISTORY — DX: Hyperlipidemia, unspecified: E78.5

## 2013-09-08 LAB — CBC
HEMATOCRIT: 34.1 % — AB (ref 36.0–46.0)
Hemoglobin: 11.3 g/dL — ABNORMAL LOW (ref 12.0–15.0)
MCH: 22.2 pg — ABNORMAL LOW (ref 26.0–34.0)
MCHC: 33.1 g/dL (ref 30.0–36.0)
MCV: 66.9 fL — ABNORMAL LOW (ref 78.0–100.0)
Platelets: 206 10*3/uL (ref 150–400)
RBC: 5.1 MIL/uL (ref 3.87–5.11)
RDW: 14.2 % (ref 11.5–15.5)
WBC: 7.1 10*3/uL (ref 4.0–10.5)

## 2013-09-08 LAB — BASIC METABOLIC PANEL
BUN: 23 mg/dL (ref 6–23)
CALCIUM: 9.2 mg/dL (ref 8.4–10.5)
CHLORIDE: 99 meq/L (ref 96–112)
CO2: 24 mEq/L (ref 19–32)
Creatinine, Ser: 1.22 mg/dL — ABNORMAL HIGH (ref 0.50–1.10)
GFR calc Af Amer: 51 mL/min — ABNORMAL LOW (ref >90)
GFR, EST NON AFRICAN AMERICAN: 44 mL/min — AB (ref >90)
GLUCOSE: 85 mg/dL (ref 70–99)
POTASSIUM: 4.5 meq/L (ref 3.7–5.3)
SODIUM: 137 meq/L (ref 137–147)

## 2013-09-08 LAB — I-STAT TROPONIN, ED: TROPONIN I, POC: 0 ng/mL (ref 0.00–0.08)

## 2013-09-08 LAB — TROPONIN I: Troponin I: <0.3 ng/mL (ref <0.30)

## 2013-09-08 MED ORDER — SUCRALFATE 1 G PO TABS: 1.0000 g | ORAL_TABLET | Freq: Three times a day (TID) | ORAL | Status: DC

## 2013-09-08 MED ORDER — OMEPRAZOLE 20 MG PO CPDR: 20.0000 mg | DELAYED_RELEASE_CAPSULE | Freq: Every day | ORAL | Status: DC

## 2013-09-08 MED ORDER — SODIUM CHLORIDE 0.9 % IV SOLN
Freq: Once | INTRAVENOUS | Status: AC
  Administered 2013-09-08: 15:00:00 via INTRAVENOUS

## 2013-09-08 MED ORDER — KETOROLAC TROMETHAMINE 30 MG/ML IJ SOLN
30.0000 mg | Freq: Once | INTRAMUSCULAR | Status: AC
  Administered 2013-09-08: 30 mg via INTRAVENOUS
  Filled 2013-09-08: qty 1

## 2013-09-08 NOTE — ED Notes (Signed)
Patient states headache throbbing 3/10 after nitro administration. Alert answering and following commands appropriate.

## 2013-09-08 NOTE — ED Notes (Signed)
Pt states she is pain free 

## 2013-09-08 NOTE — Discharge Instructions (Signed)
As discussed, your evaluation today has been largely reassuring.  But, it is important that you monitor your condition carefully, and do not hesitate to return to the ED if you develop new, or concerning changes in your condition. ° °Otherwise, please follow-up with your physician for appropriate ongoing care. ° ° °Chest Pain (Nonspecific) °It is often hard to give a specific diagnosis for the cause of chest pain. There is always a chance that your pain could be related to something serious, such as a heart attack or a blood clot in the lungs. You need to follow up with your caregiver for further evaluation. °CAUSES  °· Heartburn. °· Pneumonia or bronchitis. °· Anxiety or stress. °· Inflammation around your heart (pericarditis) or lung (pleuritis or pleurisy). °· A blood clot in the lung. °· A collapsed lung (pneumothorax). It can develop suddenly on its own (spontaneous pneumothorax) or from injury (trauma) to the chest. °· Shingles infection (herpes zoster virus). °The chest wall is composed of bones, muscles, and cartilage. Any of these can be the source of the pain. °· The bones can be bruised by injury. °· The muscles or cartilage can be strained by coughing or overwork. °· The cartilage can be affected by inflammation and become sore (costochondritis). °DIAGNOSIS  °Lab tests or other studies, such as X-rays, electrocardiography, stress testing, or cardiac imaging, may be needed to find the cause of your pain.  °TREATMENT  °· Treatment depends on what may be causing your chest pain. Treatment may include: °· Acid blockers for heartburn. °· Anti-inflammatory medicine. °· Pain medicine for inflammatory conditions. °· Antibiotics if an infection is present. °· You may be advised to change lifestyle habits. This includes stopping smoking and avoiding alcohol, caffeine, and chocolate. °· You may be advised to keep your head raised (elevated) when sleeping. This reduces the chance of acid going backward from your  stomach into your esophagus. °· Most of the time, nonspecific chest pain will improve within 2 to 3 days with rest and mild pain medicine. °HOME CARE INSTRUCTIONS  °· If antibiotics were prescribed, take your antibiotics as directed. Finish them even if you start to feel better. °· For the next few days, avoid physical activities that bring on chest pain. Continue physical activities as directed. °· Do not smoke. °· Avoid drinking alcohol. °· Only take over-the-counter or prescription medicine for pain, discomfort, or fever as directed by your caregiver. °· Follow your caregiver's suggestions for further testing if your chest pain does not go away. °· Keep any follow-up appointments you made. If you do not go to an appointment, you could develop lasting (chronic) problems with pain. If there is any problem keeping an appointment, you must call to reschedule. °SEEK MEDICAL CARE IF:  °· You think you are having problems from the medicine you are taking. Read your medicine instructions carefully. °· Your chest pain does not go away, even after treatment. °· You develop a rash with blisters on your chest. °SEEK IMMEDIATE MEDICAL CARE IF:  °· You have increased chest pain or pain that spreads to your arm, neck, jaw, back, or abdomen. °· You develop shortness of breath, an increasing cough, or you are coughing up blood. °· You have severe back or abdominal pain, feel nauseous, or vomit. °· You develop severe weakness, fainting, or chills. °· You have a fever. °THIS IS AN EMERGENCY. Do not wait to see if the pain will go away. Get medical help at once. Call your local emergency services (  911 in [place] Do not drive yourself to the hospital. °MAKE SURE YOU:  °· Understand these instructions. °· Will watch your condition. °· Will get help right away if you are not doing well or get worse. °Document Released: 01/25/2005 Document Revised: 07/10/2011 Document Reviewed: 11/21/2007 °Exit[employer]® Patient Information ©2014 [employer]. ° ° °

## 2013-09-08 NOTE — ED Provider Notes (Signed)
CSN: [id number]     Arrival date & time 09/08/13  1545 History   First MD Initiated Contact with Patient 09/08/13 1546     Chief Complaint  Patient presents with  . Chest Pain      HPI  Patient presents with chest pain.  Pain began approximately 6 hours prior to my evaluation.  Since onset the pain is gone from the right mid chest to the right upper chest, with some radiation towards the right back. There is no associated dyspnea, cough, fever, chills. Pain radiates to the right arm.  There is no associated nausea, vomiting, lightheadedness, syncope. Patient had some change with nitroglycerin, aspirin. Patient smokes, is otherwise well, does not drink, has no job, travel, diet activity. Patient was seen at urgent care, sent here for further evaluation.  Past Medical History  Diagnosis Date  . Hypertension   . Tremors of nervous system   . Hyperlipidemia    Past Surgical History  Procedure Laterality Date  . Joint replacement    . Knee surgery    . Bone spur    . Rotator cuff repair      Bilateral   History reviewed. No pertinent family history. History  Substance Use Topics  . Smoking status: Current Every Day Smoker -- 0.50 packs/day  . Smokeless tobacco: Not on file  . Alcohol Use: No   OB History   Grav Para Term Preterm Abortions TAB SAB Ect Mult Living                 Review of Systems  Constitutional:       Per HPI, otherwise negative  HENT:       Per HPI, otherwise negative  Respiratory:       Per HPI, otherwise negative  Cardiovascular:       Per HPI, otherwise negative  Gastrointestinal: Negative for vomiting.  Endocrine:       Negative aside from HPI  Genitourinary:       Neg aside from HPI   Musculoskeletal:       Per HPI, otherwise negative  Skin: Negative.   Neurological: Negative for syncope.      Allergies  Morphine and related  Home Medications   Prior to Admission medications   Medication Sig Start Date End Date Taking?  Authorizing Provider  aspirin 325 MG tablet Take 325 mg by mouth daily.    Historical Provider, MD  budesonide-formoterol (SYMBICORT) 160-4.5 MCG/ACT inhaler Inhale 2 puffs into the lungs at bedtime.    Historical Provider, MD  propranolol (INDERAL) 80 MG tablet Take 80 mg by mouth 2 (two) times daily.      Historical Provider, MD  sulfamethoxazole-trimethoprim (BACTRIM DS,SEPTRA DS) 800-160 MG per tablet Take 1 tablet by mouth 2 (two) times daily. 01/24/12   Carlisle Cater, PA-C  Vitamin D, Ergocalciferol, (DRISDOL) 50000 UNITS CAPS Take 50,000 Units by mouth every 7 (seven) days. sundays    Historical Provider, MD   BP 108/53  Pulse 60  Resp 16  Ht 5\' 9"  (1.753 m)  Wt 182 lb (82.555 kg)  BMI 26.86 kg/m2  SpO2 97% Physical Exam  Nursing note and vitals reviewed. Constitutional: She is oriented to person, place, and time. She appears well-developed and well-nourished. No distress.  HENT:  Head: Normocephalic and atraumatic.  Eyes: Conjunctivae and EOM are normal.  Cardiovascular: Normal rate and regular rhythm.   Pulmonary/Chest: Effort normal and breath sounds normal. No stridor. No respiratory distress.  Abdominal: She exhibits  no distension.  Musculoskeletal: She exhibits no edema.  Neurological: She is alert and oriented to person, place, and time. No cranial nerve deficit.  Skin: Skin is warm and dry.  Psychiatric: She has a normal mood and affect.    ED Course  Procedures (including critical care time) Labs Review Labs Reviewed  [place], ED    Imaging Review No results found.   EKG Interpretation   Date/Time:  Monday Sep 08 2013 15:48:31 EDT Ventricular Rate:  62 PR Interval:  142 QRS Duration: 72 QT Interval:  427 QTC Calculation: 434 R Axis:   50 Text Interpretation:  Sinus rhythm Abnormal R-wave progression, early  transition Sinus rhythm Artifact T wave abnormality Abnormal ekg Confirmed  by Carmin Muskrat  MD ([id number])  on 09/08/2013 4:45:07 PM       O2- 99%ra, nml  9:38 PM On exam the patient is in no distress.  All results discussed with her and her sister. With 2 negative troponins, no ongoing pain, and minimal risk profile, she was discharged in stable condition to follow up with primary care.  MDM  Patient presents after an episode right-sided chest pain.  Patient is not hypoxic, tachypneic, tachycardic, has minimal risk profile for other ACS or thromboembolic processes. Patient's evaluation here is reassuring, she was discharged in stable condition to follow up with primary care.    Carmin Muskrat, MD 09/08/13 2139

## 2013-09-08 NOTE — ED Notes (Signed)
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 tx by EMS

## 2013-09-08 NOTE — ED Notes (Signed)
Dr.Lockwood at bedside  

## 2013-09-08 NOTE — ED Provider Notes (Signed)
CSN: [id number]     Arrival date & time 09/08/13  1340 History   First MD Initiated Contact with Patient 09/08/13 1402     Chief Complaint  Patient presents with  . Chest Pain   (Consider location/radiation/quality/duration/timing/severity/associated sxs/prior Treatment) Patient is a 68 y.o. female presenting with chest pain. The history is provided by the patient.  Chest Pain Pain location:  R chest Pain quality: sharp   Pain radiates to:  L shoulder Pain radiates to the back: no   Pain severity:  Moderate Onset quality:  Sudden Duration:  45 minutes (per episode) Chronicity:  New Context comment:  Works 3rd shift and began upon returning home from  work this morning Ineffective treatments:  Aspirin Associated symptoms: numbness   Associated symptoms: no abdominal pain, no back pain, no cough, no diaphoresis, no dizziness, no fatigue, no fever, no heartburn, no nausea, no near-syncope, no palpitations, no shortness of breath, no syncope, not vomiting and no weakness   Associated symptoms comment:  Stated pain is creating pins and needles sensation at right forearm and hand Risk factors: high cholesterol, hypertension and smoking     Past Medical History  Diagnosis Date  . Hypertension   . Tremors of nervous system    Past Surgical History  Procedure Laterality Date  . Joint replacement     No family history on file. History  Substance Use Topics  . Smoking status: Current Every Day Smoker -- 0.50 packs/day  . Smokeless tobacco: Not on file  . Alcohol Use: No   OB History   Grav Para Term Preterm Abortions TAB SAB Ect Mult Living                 Review of Systems  Constitutional: Negative for fever, diaphoresis and fatigue.  Respiratory: Negative for cough and shortness of breath.   Cardiovascular: Positive for chest pain. Negative for palpitations, syncope and near-syncope.  Gastrointestinal: Negative for heartburn, nausea, vomiting and abdominal pain.   Musculoskeletal: Negative for back pain.  Neurological: Positive for numbness. Negative for dizziness and weakness.  All other systems reviewed and are negative.   Allergies  Morphine and related  Home Medications   Prior to Admission medications   Medication Sig Start Date End Date Taking? Authorizing Provider  propranolol (INDERAL) 80 MG tablet Take 80 mg by mouth 2 (two) times daily.     Yes Historical Provider, MD  aspirin 325 MG tablet Take 325 mg by mouth daily.    Historical Provider, MD  budesonide-formoterol (SYMBICORT) 160-4.5 MCG/ACT inhaler Inhale 2 puffs into the lungs at bedtime.    Historical Provider, MD  sulfamethoxazole-trimethoprim (BACTRIM DS,SEPTRA DS) 800-160 MG per tablet Take 1 tablet by mouth 2 (two) times daily. 01/24/12   Carlisle Cater, PA-C  Vitamin D, Ergocalciferol, (DRISDOL) 50000 UNITS CAPS Take 50,000 Units by mouth every 7 (seven) days. sundays    Historical Provider, MD   BP 169/88  Pulse 70  Temp(Src) 98.3 F (36.8 C) (Oral)  Resp 18  SpO2 96% Physical Exam  Nursing note and vitals reviewed. Constitutional: She is oriented to person, place, and time. She appears well-developed and well-nourished.  HENT:  Head: Normocephalic and atraumatic.  Eyes: Conjunctivae are normal. No scleral icterus.  Neck: Normal range of motion. Neck supple. No JVD present.  Cardiovascular: Normal rate, regular rhythm and normal heart sounds.   Pulmonary/Chest: Effort normal and breath sounds normal. No respiratory distress. She has no wheezes. She has no rales. She exhibits no  tenderness.  Abdominal: Soft. Bowel sounds are normal. She exhibits no distension. There is no tenderness.  Musculoskeletal: Normal range of motion. She exhibits no edema and no tenderness.  Neurological: She is alert and oriented to person, place, and time.  Skin: Skin is warm and dry. No rash noted. No erythema.  Psychiatric: She has a normal mood and affect. Her behavior is normal.    ED  Course  Procedures (including critical care time) Labs Review Labs Reviewed - No data to display  Imaging Review No results found.   MDM   1. Chest pain   ECG: NSR at 67 bpm without acute ST/T wave changes or ectopy. Location of chest pain a bit atypical as it begins in right lateral chest. However, patient with cardiac risk factors. No pleuritic chest pain. No known CAD or previous cardiology evaluation. Will transfer to [facility] for troponin and additional risk stratification.     [place], [place] 09/08/13 1537

## 2013-09-08 NOTE — ED Notes (Signed)
Per EMS, Patient was at [facility] with complaints of new onset of 5/10 Sharp right-sided chest pain radiating to the right arm. Denies any injury or activity with the right arm. Patient denies any N/V/D, SOB, or weakness with the chest pain. EKG was unremarkable. Vitals per EMS: 109/52, 62 HR, 97%, 20 RR. Patient took 650 mg of Aspirin prior to coming in. EMS gave one Nitro with no relief.

## 2013-09-08 NOTE — ED Notes (Signed)
Pt c/o mild chest pain w/intermittent sharp pains on right side Denies SOB, diaphoresis, weakness, HA, n/v Smokes 0.5 PPD Alert and talking in complete sentences w/no signs of acute distress.

## 2013-09-11 NOTE — ED Provider Notes (Signed)
Medical screening examination/treatment/procedure(s) were performed by a resident physician or non-physician practitioner and as the supervising physician I was immediately available for consultation/collaboration.  Lynne Leader, MD    Gregor Hams, MD 09/11/13 [phone]

## 2014-11-26 ENCOUNTER — Encounter: Payer: Self-pay | Admitting: Gastroenterology

## 2014-12-31 ENCOUNTER — Other Ambulatory Visit: Payer: Self-pay | Admitting: Family Medicine

## 2014-12-31 DIAGNOSIS — N183 Chronic kidney disease, stage 3 unspecified: Secondary | ICD-10-CM

## 2015-01-07 ENCOUNTER — Ambulatory Visit
Admission: RE | Admit: 2015-01-07 | Discharge: 2015-01-07 | Disposition: A | Discharge status: Home / Self Care | Payer: Medicare HMO | Source: Ambulatory Visit | Attending: Family Medicine | Admitting: Family Medicine

## 2015-01-07 DIAGNOSIS — N183 Chronic kidney disease, stage 3 unspecified: Secondary | ICD-10-CM

## 2015-01-11 ENCOUNTER — Encounter (HOSPITAL_COMMUNITY): Payer: Self-pay | Admitting: *Deleted

## 2015-01-11 ENCOUNTER — Observation Stay (HOSPITAL_COMMUNITY)
Admission: EM | Admit: 2015-01-11 | Discharge: 2015-01-12 | Disposition: A | Discharge status: Home / Self Care | Payer: Medicare HMO | Attending: Family Medicine | Admitting: Family Medicine

## 2015-01-11 ENCOUNTER — Emergency Department (HOSPITAL_COMMUNITY): Payer: Medicare HMO

## 2015-01-11 DIAGNOSIS — R079 Chest pain, unspecified: Secondary | ICD-10-CM | POA: Diagnosis present

## 2015-01-11 DIAGNOSIS — Z720 Tobacco use: Secondary | ICD-10-CM | POA: Diagnosis not present

## 2015-01-11 DIAGNOSIS — I10 Essential (primary) hypertension: Secondary | ICD-10-CM | POA: Diagnosis present

## 2015-01-11 DIAGNOSIS — Z7982 Long term (current) use of aspirin: Secondary | ICD-10-CM | POA: Insufficient documentation

## 2015-01-11 DIAGNOSIS — E785 Hyperlipidemia, unspecified: Secondary | ICD-10-CM | POA: Diagnosis not present

## 2015-01-11 DIAGNOSIS — R0789 Other chest pain: Secondary | ICD-10-CM | POA: Diagnosis present

## 2015-01-11 DIAGNOSIS — Z79899 Other long term (current) drug therapy: Secondary | ICD-10-CM | POA: Insufficient documentation

## 2015-01-11 LAB — BASIC METABOLIC PANEL
Anion gap: 10 (ref 5–15)
BUN: 21 mg/dL — ABNORMAL HIGH (ref 6–20)
CO2: 22 mmol/L (ref 22–32)
CREATININE: 1.09 mg/dL — AB (ref 0.44–1.00)
Calcium: 9.5 mg/dL (ref 8.9–10.3)
Chloride: 109 mmol/L (ref 101–111)
GFR calc Af Amer: 58 mL/min — ABNORMAL LOW (ref >60)
GFR calc non Af Amer: 50 mL/min — ABNORMAL LOW (ref >60)
Glucose, Bld: 95 mg/dL (ref 65–99)
Potassium: 4.2 mmol/L (ref 3.5–5.1)
SODIUM: 141 mmol/L (ref 135–145)

## 2015-01-11 LAB — CBC
HCT: 31.9 % — ABNORMAL LOW (ref 36.0–46.0)
HCT: 32.5 % — ABNORMAL LOW (ref 36.0–46.0)
Hemoglobin: 10.3 g/dL — ABNORMAL LOW (ref 12.0–15.0)
Hemoglobin: 10.7 g/dL — ABNORMAL LOW (ref 12.0–15.0)
MCH: 22.4 pg — ABNORMAL LOW (ref 26.0–34.0)
MCH: 22.9 pg — AB (ref 26.0–34.0)
MCHC: 32.3 g/dL (ref 30.0–36.0)
MCHC: 32.9 g/dL (ref 30.0–36.0)
MCV: 69.3 fL — ABNORMAL LOW (ref 78.0–100.0)
MCV: 69.4 fL — ABNORMAL LOW (ref 78.0–100.0)
PLATELETS: 191 10*3/uL (ref 150–400)
PLATELETS: 183 10*3/uL (ref 150–400)
RBC: 4.6 MIL/uL (ref 3.87–5.11)
RBC: 4.68 MIL/uL (ref 3.87–5.11)
RDW: 14.8 % (ref 11.5–15.5)
RDW: 14.8 % (ref 11.5–15.5)
WBC: 5.2 10*3/uL (ref 4.0–10.5)
WBC: 6 10*3/uL (ref 4.0–10.5)

## 2015-01-11 LAB — CREATININE, SERUM
CREATININE: 1.19 mg/dL — AB (ref 0.44–1.00)
GFR calc Af Amer: 52 mL/min — ABNORMAL LOW (ref >60)
GFR, EST NON AFRICAN AMERICAN: 45 mL/min — AB (ref >60)

## 2015-01-11 LAB — I-STAT TROPONIN, ED: Troponin i, poc: 0 ng/mL (ref 0.00–0.08)

## 2015-01-11 LAB — TROPONIN I

## 2015-01-11 MED ORDER — ACETAMINOPHEN 325 MG PO TABS: 650.0000 mg | ORAL_TABLET | ORAL | Status: DC | PRN

## 2015-01-11 MED ORDER — SIMVASTATIN 10 MG PO TABS
10.0000 mg | ORAL_TABLET | Freq: Every morning | ORAL | Status: DC
  Administered 2015-01-12: 10 mg via ORAL
  Filled 2015-01-11: qty 1

## 2015-01-11 MED ORDER — NITROGLYCERIN 0.4 MG SL SUBL: 0.4000 mg | SUBLINGUAL_TABLET | SUBLINGUAL | Status: DC | PRN

## 2015-01-11 MED ORDER — ASPIRIN 325 MG PO TABS
325.0000 mg | ORAL_TABLET | Freq: Every day | ORAL | Status: DC
  Administered 2015-01-12: 325 mg via ORAL
  Filled 2015-01-11: qty 1

## 2015-01-11 MED ORDER — PROPRANOLOL HCL 80 MG PO TABS
80.0000 mg | ORAL_TABLET | Freq: Two times a day (BID) | ORAL | Status: DC
  Administered 2015-01-11 – 2015-01-12 (×2): 80 mg via ORAL
  Filled 2015-01-11 (×3): qty 1

## 2015-01-11 MED ORDER — GI COCKTAIL ~~LOC~~: 30.0000 mL | Freq: Four times a day (QID) | ORAL | Status: DC | PRN

## 2015-01-11 MED ORDER — HYDROMORPHONE HCL 1 MG/ML IJ SOLN: 0.5000 mg | INTRAMUSCULAR | Status: DC | PRN

## 2015-01-11 MED ORDER — HEPARIN SODIUM (PORCINE) 5000 UNIT/ML IJ SOLN
5000.0000 [IU] | Freq: Three times a day (TID) | INTRAMUSCULAR | Status: DC
  Administered 2015-01-11 – 2015-01-12 (×3): 5000 [IU] via SUBCUTANEOUS
  Filled 2015-01-11 (×3): qty 1

## 2015-01-11 MED ORDER — ONDANSETRON HCL 4 MG/2ML IJ SOLN: 4.0000 mg | Freq: Four times a day (QID) | INTRAMUSCULAR | Status: DC | PRN

## 2015-01-11 NOTE — ED Provider Notes (Signed)
CSN: [id number]     Arrival date & time 01/11/15  1443 History   First MD Initiated Contact with Patient 01/11/15 1447     Chief Complaint  Patient presents with  . Chest Pain     (Consider location/radiation/quality/duration/timing/severity/associated sxs/prior Treatment) HPI Comments: Pt is a 69 yo female with history of HTN and smoker who presents to the ED via EMS with complaint of CP, onset last night. Pt reports she was sitting at work when she started to have "indigestion" at 3am (works night shift). She describes that pain as a constant pressure to her central chest that radiates to her neck. She reports once she got home from work she took a 325mg  ASA and then fell asleep. She notes when she woke up she still had chest pressure with numbness noted to her left arm. Denies fever, headache, sore throat, SOB, dyspnea, abdominal pain, N/V/D, weakness. EMS administered 3 SL NTG PTA, pt reports improvement of pain after 1st nitro, then pain returned and pain improved again after nitro x2 was given. Denies history of similar episodes of CP/indigestion.   Patient is a 69 y.o. female presenting with chest pain.  Chest Pain Associated symptoms: numbness     Past Medical History  Diagnosis Date  . Hypertension   . Tremors of nervous system   . Hyperlipidemia    Past Surgical History  Procedure Laterality Date  . Joint replacement    . Knee surgery    . Bone spur    . Rotator cuff repair      Bilateral   No family history on file. Social History  Substance Use Topics  . Smoking status: Current Every Day Smoker -- 0.50 packs/day  . Smokeless tobacco: None  . Alcohol Use: No   OB History    No data available     Review of Systems  Cardiovascular: Positive for chest pain.  Neurological: Positive for numbness.  All other systems reviewed and are negative.     Allergies  Morphine and related  Home Medications   Prior to Admission medications   Medication Sig Start Date  End Date Taking? Authorizing Provider  acetaminophen (TYLENOL) 650 MG CR tablet Take 1,300 mg by mouth every 8 (eight) hours as needed for pain.    Historical Provider, MD  aspirin 325 MG tablet Take 325 mg by mouth daily.    Historical Provider, MD  hydrochlorothiazide (MICROZIDE) 12.5 MG capsule Take 12.5 mg by mouth every morning. 08/27/13   Historical Provider, MD  omeprazole (PRILOSEC) 20 MG capsule Take 1 capsule (20 mg total) by mouth daily. 09/08/13   Carmin Muskrat, MD  primidone (MYSOLINE) 50 MG tablet Take 50 mg by mouth every morning. 08/27/13   Historical Provider, MD  propranolol (INDERAL) 80 MG tablet Take 80 mg by mouth 2 (two) times daily.      Historical Provider, MD  simvastatin (ZOCOR) 10 MG tablet Take 10 mg by mouth every morning. 08/27/13   Historical Provider, MD  sucralfate (CARAFATE) 1 G tablet Take 1 tablet (1 g total) by mouth 4 (four) times daily -  with meals and at bedtime. 09/08/13   Carmin Muskrat, MD  Vitamin D, Ergocalciferol, (DRISDOL) 50000 UNITS CAPS Take 50,000 Units by mouth every 7 (seven) days. sundays    Historical Provider, MD   BP 109/53 mmHg  Pulse 64  Temp(Src) 98.2 F (36.8 C) (Oral)  Resp 18  Ht 5\' 9"  (1.753 m)  Wt 185 lb (83.915 kg)  BMI  27.31 kg/m2  SpO2 99% Physical Exam  Constitutional: She is oriented to person, place, and time. She appears well-developed and well-nourished. No distress.  HENT:  Head: Normocephalic and atraumatic.  Mouth/Throat: Oropharynx is clear and moist. No oropharyngeal exudate.  Eyes: Conjunctivae and EOM are normal. Pupils are equal, round, and reactive to light. Right eye exhibits no discharge. Left eye exhibits no discharge. No scleral icterus.  Neck: Normal range of motion. Neck supple.  Cardiovascular: Normal rate, regular rhythm, normal heart sounds and intact distal pulses.   Pulmonary/Chest: Effort normal and breath sounds normal. She has no wheezes. She has no rales. She exhibits tenderness (mild  tenderness at midsternal chest wall).  Abdominal: Soft. Bowel sounds are normal. She exhibits no mass. There is no tenderness. There is no rebound and no guarding.  Musculoskeletal: Normal range of motion. She exhibits no edema or tenderness.  Lymphadenopathy:    She has no cervical adenopathy.  Neurological: She is alert and oriented to person, place, and time. She has normal strength. No sensory deficit.  Skin: Skin is warm and dry. She is not diaphoretic.  Nursing note and vitals reviewed.   ED Course  Procedures (including critical care time) Labs Review Labs Reviewed  BASIC METABOLIC PANEL  CBC    Imaging Review No results found. I have personally reviewed and evaluated these images and lab results as part of my medical decision-making.   EKG Interpretation   Date/Time:  Monday January 11 2015 14:51:24 EDT Ventricular Rate:  66 PR Interval:  164 QRS Duration: 76 QT Interval:  400 QTC Calculation: 419 R Axis:   39 Text Interpretation:  Normal sinus rhythm Nonspecific T wave abnormality  Abnormal ECG Confirmed by POLLINA  MD, CHRISTOPHER ([id number]) on 01/11/2015  3:01:55 PM     Filed Vitals:   01/11/15 1645  BP: 127/62  Pulse: 60  Temp:   Resp: 21    MDM   Final diagnoses:  Chest pain, unspecified chest pain type    Pt presents with CP that improved with nitro. No prior history of similar CP or relief of CP with nitro. History of tobacco use, HTN and HLD. Positive family history (mother and younger sister with CAD). VSS. Midsternal chest wall mildly TTP on exam. \  EKG unchanged. Troponin negative. BMP, CBC unremarkable. Pt has a HEART score of 4 (moderate risk). Will plan to admit pt for further evaluation. Consulted hospitalist, agree with plan for admission. Discussed plan for admission with pt, pt agrees.    Chesley Noon [place], [place] 01/11/15 1720  Orpah Greek, MD 01/12/15 [phone]

## 2015-01-11 NOTE — ED Provider Notes (Signed)
Patient presented to the ER with chest pain. Patient had central chest heaviness and pressure had onset while sitting at rest overnight. She was at work when the pain began. She describes it as feeling like indigestion, but states that she never gets indigestion. Patient went home and continued to have symptoms. EMS was called and she had a single sublingual nitroglycerin which relieved her discomfort. The pain came back and she was given 2 additional sublingual nitroglycerin's with improvement, but still has 1 out of 10 heaviness. No shortness of breath, nausea, diaphoresis.  Face to face Exam: HEENT - PERRLA Lungs - CTAB Heart - RRR, no M/R/G Abd - S/NT/ND Neuro - alert, oriented x3  Plan: With central chest discomfort and heaviness that has been mostly relieved with nitroglycerin. She does not have any known coronary artery disease, but is a smoker and has history of hypertension and high cholesterol. Her mother had heart disease and she has a younger sibling that has coronary artery disease. Will require hospitalization for further evaluation.  Orpah Greek, MD 01/11/15 1555

## 2015-01-11 NOTE — H&P (Signed)
[facility] Admission History and Physical Service Pager: [phone]  Patient name: Catherine Lowery Medical record number: [id number] Date of birth: 18-Dec-1945 Age: 69 y.o. Gender: female  Primary Care Provider: Lorenza Evangelist, Osborne Oman at [facility]; Dr. Amalia Hailey Neurology in [place] for ET Consultants: Cardiology Code Status: FULL  Chief Complaint: Chest Discomfort  Assessment and Plan: Catherine Lowery is a 69 y.o. female presenting with <24 hours of new-onset chest discomfort. PMH is significant for HTN, HLD, essential tremor, and sickle cell trait.  Atypical Angina: Patient without history of cardiac disease; however, ACS must be ruled out considering presentation of substernal chest discomfort that is relieved with nitroglycerin in this patient with significant risk factors including current tobacco use, HTN, and HLD. Differential diagnosis also includes GERD, though patient without history of GERD and pain did not arise post-prandially. Costochondritis is another possibility considering tenderness to palpation over the sternum and right clavicle. - Telemetry - Trending troponin - Aspirin 325 mg PO daily - Tylenol 650 mg PO q4h PRN for mild pain - Hydomorphone 0.5 mg IV q2h PRN for chest pain - Nitroglycerin 0.4 mg sublingual PRN for chest pain - Zofran 4 mg q6h PRN for nausea - Repeat EKG 9/13 AM - O2 by [place] PRN for O2 sats <92%  Hypertension: Normotensive on admission. Managed with hydrochlorothiazide; also on propanolol for essential tremor. - Continue home hydrochlorothiazide 12.5 mg PO qAM - Continue home propanolol 80 mg PO BID as below  Essential Tremor: Managed with propanolol and primidone. - Continue home propanolol 80 mg PO BID - Continue home primidone 50 mg PO qAM  Hyperlipidemia: Most recent lipid panel (08/11/14) notable for triglycerides 176, HDL 31, LDL 92. Managed on simvastatin. - Continue home simvastatin 10 mg PO qAM  Baseline  Diaphoresis, Heat Avoidance: Patient without previous evaluation of thyroid. - TSH  FEN/GI: NPO Prophylaxis: SQH  Disposition: Admit to Inpatient Family Medicine Teaching Service for further evaluation and management.  History of Present Illness:  Catherine Lowery is a 69 y.o. female presenting with new-onset chest discomfort. 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 She attempted walking and drinking water without relief. Pain was constant (5/10) for several hours. At the end of her shift at 08:00, took two 325 mg ASA which relieved the pain. Slept until 13:00, when she woke with a sensation that someone was sitting on her chest. Also noted L arm numbness that lasted roughly 10 minutes. Took another 325 mg ASA at 13:15 and then called EMS, who advised that she take four additional 325 mg ASA. She denies associated nausea, vomiting, SOB, and diaphoresis. Also denies associated anxiety.  Upon arrival by EMs, initial EKG was without change; however, she was transported to the ED for further management. Received 3 nitroglycerin tablets en route, which significantly improved her chest pain (8/10>1/10).  She does report one episode of similar chest pain in the past, and reports that she was seen in the clinic at that time. She was told that this was not ACS; however, she is unable to recall the final diagnosis.  Review Of Systems: Per HPI with the following additions: Endorses baseline sweating and heat avoidance. Denies palpitations. Some leg swelling yesterday, improved with raising legs.  Denies PND, orthopnea. Sleeps on three pillows due to neck pain. Denies abdominal pain, nausea, vomiting, diarrhea, and constipation. Denies urinary frequency, dysuria,  hematuria. Otherwise, 12 point review of systems was performed and was unremarkable.  Patient Active Problem List   Diagnosis Date Noted  . Atypical chest pain 01/11/2015   Past Medical History: Past Medical History  Diagnosis Date  . Hypertension   . Tremors of nervous system   . Hyperlipidemia    Past Surgical History: Past Surgical History  Procedure Laterality Date  . Joint replacement    . Knee surgery    . Bone spur    . Rotator cuff repair      Bilateral   Social History: Social History  Substance Use Topics  . Smoking status: Current Every Day Smoker -- 0.50 packs/day  . Smokeless tobacco: None  . Alcohol Use: No   Additional social history: Starting smoking tobacco at age 48 as a relaxant when her "nerves were bad." Denies recreational drug use. Please also refer to relevant sections of EMR.  Family History: CHF: Mother, advanced age at diagnosis Stroke: Mother, during hip replacement in her 70s Multiple Myeloma: Father CAD: Sister (younger) and Mother  Allergies and Medications: Allergies  Allergen Reactions  . Morphine And Related Itching   No current facility-administered medications on file prior to encounter.   Current Outpatient Prescriptions on File Prior to Encounter  Medication Sig Dispense Refill  . aspirin 325 MG tablet Take 325 mg by mouth daily.    . hydrochlorothiazide (MICROZIDE) 12.5 MG capsule Take 12.5 mg by mouth every morning.    . primidone (MYSOLINE) 50 MG tablet Take 50 mg by mouth every morning.    . propranolol (INDERAL) 80 MG tablet Take 80 mg by mouth 2 (two) times daily.      . simvastatin (ZOCOR) 10 MG tablet Take 10 mg by mouth every morning.    . Vitamin D, Ergocalciferol, (DRISDOL) 50000 UNITS CAPS Take 50,000 Units by mouth every 7 (seven) days. sundays    . omeprazole (PRILOSEC) 20 MG capsule Take 1 capsule (20 mg total) by mouth daily. (Patient not taking: Reported on 01/11/2015) 20 capsule 0  . sucralfate (CARAFATE) 1 G  tablet Take 1 tablet (1 g total) by mouth 4 (four) times daily -  with meals and at bedtime. (Patient not taking: Reported on 01/11/2015) 28 tablet 0    Objective: BP 127/62 mmHg  Pulse 60  Temp(Src) 98.2 F (36.8 C) (Oral)  Resp 21  Ht 5' 9" (1.753 m)  Wt 83.915 kg (185 lb)  BMI 27.31 kg/m2  SpO2 100%  Exam: General: Well-nourished woman resting comfortably in bed, in no acute distress. HEENT: PERRLA. Sclera anicteric and conjunctiva clear. Oropharynx clear without lesions. Moist mucous membranes. Neck with full range of motion. Cardiovascular: RRR without murmur/rub/gallop. DP and radial pulses 2+ bilaterally.  Respiratory: Normal work of breathing on room air. Bibasilar crackles appreciated, otherwise LCTAB. Abdomen: Soft, non-tender and non-distended. No palpable abdominal masses or hepatosplenomegaly appreciated. MSK: Tenderness to palpation over the sternum and R chest wall about the clavicle. No obvious skeletal deformities. Extremities: Warm and well-perfused without edema or erythema. Skin: No rashes or lesions appreciated. Neuro: Alert and oriented x3. No focal neurological deficits appreciated. Psych: Pleasant and appropriate.  Labs and Imaging: CBC BMET   Recent Labs Lab 01/11/15 1530  WBC 5.2  HGB 10.3*  HCT 31.9*  PLT 191    Recent Labs Lab 01/11/15 1530  NA 141    K 4.2  CL 109  CO2 22  BUN 21*  CREATININE 1.09*  GLUCOSE 95  CALCIUM 9.5     Troponin: <0.03  EKG: Normal EKG, normal sinus rhythm. Nonspecific T wave abnormality.  Dg Chest 2 View  01/11/2015   CLINICAL DATA:  Chest pain  EXAM: CHEST  2 VIEW  COMPARISON:  09/08/2013  FINDINGS: Cardiomediastinal silhouette is stable. No acute infiltrate or pleural effusion. No pulmonary edema. Mild degenerative changes thoracic spine.  IMPRESSION: No active cardiopulmonary disease.   Electronically Signed   By: Liviu  Pop M.D.   On: 01/11/2015 16:08    Danielle Martin, Med Student 01/11/2015, 5:23  PM MS4, [employer] FPTS Intern pager: [phone], text pages welcome  RESIDENT ADDENDUM  I have separately seen and examined the patient. I have discussed the findings and exam with the medical student, helped develop the management plan that is described in the student's note, and I agree with the content.  Additionally I have outlined my exam and assessment/plan below:   S:  68 y.o. female with a history of HTN, HLD, ~10 pack-years tobacco use, essential tremor, vitamin D deficiency, and CKD presenting with chest pain. 2 episodes: the first was at work last night occurring spontaneously at rest described as "burning" radiating into neck not changed by rest/exertion, but seemed to eventually go away with ASA taken hours later. Second was on afternoon of admission with central chest heaviness/pressure as she awoke from sleep associated with difficulty breathing causing her to take more ASA and call EMS. She notes no change with rest/exertion, but NTG given by EMS helped significantly. +CAD in multiple relatives.  S/p bilateral TKAs and rotator cuff repairs.   O:  BP 131/57 mmHg  Pulse 74  Temp(Src) 98.1 F (36.7 C) (Oral)  Resp 18  Ht 5' 9.5" (1.765 m)  Wt 182 lb 1.6 oz (82.6 kg)  BMI 26.51 kg/m2  SpO2 96% Gen: Pleasant female appearing younger than stated age CV: RRR, no murmur, rub, gallop, distal pulses 2+. No JVD or LE edema Pulm: Nonlabored, saturating high 90%'s on room air, mild bibasilar crackles  A/P:  Atypical angina: Substernal, relieved by NTG. HEART score: 5. Wells score for PE: 0. Euvolemic. Costochondritis possible given reproducibility and relief with ASA. dyspepsia/GERD also possible.  - Admit to FMTS, Dr. McIntyre attending, on telemetry - Repeat ECG in AM (diffuse T wave flattening in ED, unchanged from prior) - Cycle troponins (1st is neg) - Cardiology consult, likely will need outpatient stress - Lipid panel, Hb A1c, TSH - Morphine 2 mg IV q2h prn  chest pain  - Nitroglycerin 0.4mg SL q5min prn chest pain  - O2 by [place] prn hypoxemia - Continue ASA, propranolol, simvastatin  CKD: Creatinine at approximate baseline, 1.09 on admission. Recent renal U/S with evidence of medical renal disease (diffusely increased echogenicity, atrophic cortex) and bilateral simle renal cysts.  - Monitor and continue with ongoing outpatient management.   Ryan B. Grunz, MD, PGY-3 01/12/2015 12:38 AM  

## 2015-01-11 NOTE — ED Notes (Signed)
Pt presents via [facility] for c/o centralized chest pain.  Pt works night shift and was at work when she begin having chest pain what she thought was heartburn, went home and went to bed and woke up with the same sensation radiating into her jaw and left arm.  Pt took 4x 324 ASA at home, EMS administered 3 SL NTG, after one the pain subsided, pain came back and then 2 x were administered relieveing the pain again.  EKG unremarkable , Hx: Htn, HLD, CHF.  Pt a x 4, NAD, CP free on arrival.  121/76 HR-70 R-18 98% RA.

## 2015-01-12 ENCOUNTER — Encounter (HOSPITAL_COMMUNITY): Payer: Medicare HMO

## 2015-01-12 ENCOUNTER — Other Ambulatory Visit (HOSPITAL_COMMUNITY): Payer: Medicare HMO

## 2015-01-12 DIAGNOSIS — I10 Essential (primary) hypertension: Secondary | ICD-10-CM | POA: Diagnosis present

## 2015-01-12 DIAGNOSIS — Z720 Tobacco use: Secondary | ICD-10-CM | POA: Diagnosis not present

## 2015-01-12 DIAGNOSIS — R0789 Other chest pain: Secondary | ICD-10-CM

## 2015-01-12 DIAGNOSIS — R079 Chest pain, unspecified: Secondary | ICD-10-CM | POA: Diagnosis not present

## 2015-01-12 DIAGNOSIS — E785 Hyperlipidemia, unspecified: Secondary | ICD-10-CM | POA: Diagnosis present

## 2015-01-12 LAB — NM MYOCAR MULTI W/SPECT W/WALL MOTION / EF
LV sys vol: 33 mL
LVDIAVOL: 79 mL
NUC STRESS TID: 1.21
RATE: 0
SDS: 0
SRS: 7
SSS: 5

## 2015-01-12 LAB — TROPONIN I

## 2015-01-12 LAB — TSH: TSH: 0.045 u[IU]/mL — ABNORMAL LOW (ref 0.350–4.500)

## 2015-01-12 MED ORDER — TECHNETIUM TC 99M SESTAMIBI GENERIC - CARDIOLITE
30.0000 | Freq: Once | INTRAVENOUS | Status: AC | PRN
  Administered 2015-01-12: 30 via INTRAVENOUS

## 2015-01-12 MED ORDER — TECHNETIUM TC 99M SESTAMIBI GENERIC - CARDIOLITE
10.0000 | Freq: Once | INTRAVENOUS | Status: AC | PRN
  Administered 2015-01-12: 10 via INTRAVENOUS

## 2015-01-12 MED ORDER — REGADENOSON 0.4 MG/5ML IV SOLN
INTRAVENOUS | Status: AC
Start: 2015-01-12 — End: 2015-01-12
  Administered 2015-01-12: 0.4 mg via INTRAVENOUS
  Filled 2015-01-12: qty 5

## 2015-01-12 MED ORDER — REGADENOSON 0.4 MG/5ML IV SOLN
0.4000 mg | Freq: Once | INTRAVENOUS | Status: AC
  Administered 2015-01-12: 0.4 mg via INTRAVENOUS
  Filled 2015-01-12: qty 5

## 2015-01-12 NOTE — Discharge Instructions (Signed)
You were admitted for chest pain. Your work up in the hospital was normal. Please follow up with cardiology and your primary care provider  Follow-up Information    Schedule an appointment as soon as possible for a visit with Pixie Casino, MD.   Specialty:  Cardiology   Contact information:   [place] [address] [phone]       Schedule an appointment as soon as possible for a visit with LONG,ASHLEY B, PA-C.   Specialty:  Physician Assistant   Contact information:   [address] [place] [address] [phone]       Chest Pain (Nonspecific) It is often hard to give a specific diagnosis for the cause of chest pain. There is always a chance that your pain could be related to something serious, such as a heart attack or a blood clot in the lungs. You need to follow up with your health care provider for further evaluation. CAUSES   Heartburn.  Pneumonia or bronchitis.  Anxiety or stress.  Inflammation around your heart (pericarditis) or lung (pleuritis or pleurisy).  A blood clot in the lung.  A collapsed lung (pneumothorax). It can develop suddenly on its own (spontaneous pneumothorax) or from trauma to the chest.  Shingles infection (herpes zoster virus). The chest wall is composed of bones, muscles, and cartilage. Any of these can be the source of the pain.  The bones can be bruised by injury.  The muscles or cartilage can be strained by coughing or overwork.  The cartilage can be affected by inflammation and become sore (costochondritis). DIAGNOSIS  Lab tests or other studies may be needed to find the cause of your pain. Your health care provider may have you take a test called an ambulatory electrocardiogram (ECG). An ECG records your heartbeat patterns over a 24-hour period. You may also have other tests, such as:  Transthoracic echocardiogram (TTE). During echocardiography, sound waves are used to evaluate how blood  flows through your heart.  Transesophageal echocardiogram (TEE).  Cardiac monitoring. This allows your health care provider to monitor your heart rate and rhythm in real time.  Holter monitor. This is a portable device that records your heartbeat and can help diagnose heart arrhythmias. It allows your health care provider to track your heart activity for several days, if needed.  Stress tests by exercise or by giving medicine that makes the heart beat faster. TREATMENT   Treatment depends on what may be causing your chest pain. Treatment may include:  Acid blockers for heartburn.  Anti-inflammatory medicine.  Pain medicine for inflammatory conditions.  Antibiotics if an infection is present.  You may be advised to change lifestyle habits. This includes stopping smoking and avoiding alcohol, caffeine, and chocolate.  You may be advised to keep your head raised (elevated) when sleeping. This reduces the chance of acid going backward from your stomach into your esophagus. Most of the time, nonspecific chest pain will improve within 2-3 days with rest and mild pain medicine.  HOME CARE INSTRUCTIONS   If antibiotics were prescribed, take them as directed. Finish them even if you start to feel better.  For the next few days, avoid physical activities that bring on chest pain. Continue physical activities as directed.  Do not use any tobacco products, including cigarettes, chewing tobacco, or electronic cigarettes.  Avoid drinking alcohol.  Only take medicine as directed by your health care provider.  Follow your health care provider's suggestions for further testing  if your chest pain does not go away.  Keep any follow-up appointments you made. If you do not go to an appointment, you could develop lasting (chronic) problems with pain. If there is any problem keeping an appointment, call to reschedule. SEEK MEDICAL CARE IF:   Your chest pain does not go away, even after  treatment.  You have a rash with blisters on your chest.  You have a fever. SEEK IMMEDIATE MEDICAL CARE IF:   You have increased chest pain or pain that spreads to your arm, neck, jaw, back, or abdomen.  You have shortness of breath.  You have an increasing cough, or you cough up blood.  You have severe back or abdominal pain.  You feel nauseous or vomit.  You have severe weakness.  You faint.  You have chills. This is an emergency. Do not wait to see if the pain will go away. Get medical help at once. Call your local emergency services (911 in [place] Do not drive yourself to the hospital. MAKE SURE YOU:   Understand these instructions.  Will watch your condition.  Will get help right away if you are not doing well or get worse. Document Released: 01/25/2005 Document Revised: 04/22/2013 Document Reviewed: 11/21/2007 [facility] Patient Information 2015 [place], [place]. This information is not intended to replace advice given to you by your health care provider. Make sure you discuss any questions you have with your health care provider.

## 2015-01-12 NOTE — Discharge Summary (Signed)
[facility] Discharge Summary  Patient name: Catherine Lowery Medical record number: [id number] Date of birth: 03-May-1945 Age: 69 y.o. Gender: female Date of Admission: 01/11/2015  Date of Discharge: 01/12/2015  Admitting Physician: Leeanne Rio, MD  Primary Care Provider: No primary care provider on file. Consultants: Cardiology  Indication for Hospitalization: Chest Pain  Discharge Diagnoses/Problem List:  Patient Active Problem List   Diagnosis Date Noted  . Hyperlipidemia 01/12/2015  . Benign essential HTN 01/12/2015  . Tobacco abuse 01/12/2015  . Atypical chest pain 01/11/2015     Disposition: Home  Discharge Condition: Stable  Discharge Exam:   Objective: Temp: [98.1 F (36.7 C)-98.3 F (36.8 C)] 98.3 F (36.8 C) (09/13 0530) Pulse Rate: [55-74] 62 (09/13 0530) Resp: [13-21] 18 (09/13 0530) BP: (109-144)/(50-83) 109/59 mmHg (09/13 0530) SpO2: [96 %-100 %] 96 % (09/13 0530) Weight: [182 lb (82.555 kg)-185 lb (83.915 kg)] 182 lb (82.555 kg) (09/13 0530) Physical Exam: Lowery: NAD Cardiovascular: RRR  Respiratory: CTAB Abdomen: soft, non tender + BS Extremities: no LE edema, non tender  Brief Hospital Course:  Catherine Lowery is a 69 y.o. female presenting with <24 hours of new-onset chest discomfort. PMH is significant for HTN, HLD, essential tremor, and sickle cell trait.  By admission her chest pain had resolved after being given Nitroglycerin. Initial EKG and were negative but given her age and risk factors she was observed overnight for ACS rule out. Her troponins remained negative, but EKG in the AM was significant for t wave flattening. Cardiology was following and recommended stress testing. She had a low risk stress test and was discharged home to follow with cardiology and her PCP Significantly her TSH was elevated to .045.     Issues for Follow Up:  1. Resolution of chest pain 2. Consider checking lipid  panel as an oupatient 3.         Consider rechecking TSH and further work up of hyperthyroidism  Significant Procedures:  Pharmacologic Stress test  Significant Labs and Imaging:   Recent Labs Lab 01/11/15 1530 01/11/15 2250  WBC 5.2 6.0  HGB 10.3* 10.7*  HCT 31.9* 32.5*  PLT 191 183    Recent Labs Lab 01/11/15 1530 01/11/15 2250  NA 141  --   K 4.2  --   CL 109  --   CO2 22  --   GLUCOSE 95  --   BUN 21*  --   CREATININE 1.09* 1.19*  CALCIUM 9.5  --       Results/Tests Pending at Time of Discharge: None  Discharge Medications:    Medication List    TAKE these medications        aspirin 325 MG tablet  Take 325 mg by mouth daily.     hydrochlorothiazide 12.5 MG capsule  Commonly known as:  MICROZIDE  Take 12.5 mg by mouth every morning.     omeprazole 20 MG capsule  Commonly known as:  PRILOSEC  Take 1 capsule (20 mg total) by mouth daily.     primidone 50 MG tablet  Commonly known as:  MYSOLINE  Take 50 mg by mouth every morning.     propranolol 80 MG tablet  Commonly known as:  INDERAL  Take 80 mg by mouth 2 (two) times daily.     simvastatin 10 MG tablet  Commonly known as:  ZOCOR  Take 10 mg by mouth every morning.     sucralfate 1 G tablet  Commonly known  as:  CARAFATE  Take 1 tablet (1 g total) by mouth 4 (four) times daily -  with meals and at bedtime.     Vitamin D (Ergocalciferol) 50000 UNITS Caps capsule  Commonly known as:  DRISDOL  Take 50,000 Units by mouth every 7 (seven) days. sundays  Notes to Patient:  Take this medication on next scheduled day.        Discharge Instructions: Please refer to Patient Instructions section of EMR for full details.  Patient was counseled important signs and symptoms that should prompt return to medical care, changes in medications, dietary instructions, activity restrictions, and follow up appointments.   Follow-Up Appointments:     Follow-up Information    Schedule an appointment as soon  as possible for a visit with Pixie Casino, MD.   Specialty:  Cardiology   Contact information:   [place] [address] [phone]       Schedule an appointment as soon as possible for a visit with LONG,ASHLEY B, PA-C.   Specialty:  Physician Assistant   Contact information:   [address] [place] [address] [phone]       Veatrice Bourbon, MD 01/12/2015, 6:16 PM PGY-2, [place]

## 2015-01-12 NOTE — Progress Notes (Signed)
Family Medicine Teaching Service Daily Progress Note Intern Pager: [phone]  Patient name: Catherine Lowery Medical record number: [id number] Date of birth: [birth year] Age: 69 y.o. Gender: female  Primary Care Provider: No primary care provider on file. Consultants: Cardiology Code Status: Full  Pt Overview and Major Events to Date:  9/12: Admitted for ACS rule out  Assessment and Plan:  Atypical Angina: Chest pain improved this AM with negative troponins but lateral T wave flattening on EKG - Cardiology following and rec stress test today, will follow - Telemetry - Trending troponin - Aspirin 325 mg PO daily - Tylenol 650 mg PO q4h PRN for mild pain - Hydomorphone 0.5 mg IV q2h PRN for chest pain - Nitroglycerin 0.4 mg sublingual PRN for chest pain - Zofran 4 mg q6h PRN for nausea - O2 by [place] PRN for O2 sats <92%  Hypertension: Stable - Continue home hydrochlorothiazide 12.5 mg PO qAM - Continue home propanolol 80 mg PO BID as below  Essential Tremor: Managed with propanolol and primidone. - Continue home propanolol 80 mg PO BID - Continue home primidone 50 mg PO qAM  Hyperlipidemia: Most recent lipid panel (08/11/14) notable for triglycerides 176, HDL 31, LDL 92. Managed on simvastatin. - Continue home simvastatin 10 mg PO qAM  Baseline Diaphoresis, Heat Avoidance: Patient without previous evaluation of thyroid. - TSH  FEN/GI: NPO Prophylaxis: SQH   Disposition: Pending clinical improvement  Subjective:  Denies chest pain, SOB  Objective: Temp:  [98.1 F (36.7 C)-98.3 F (36.8 C)] 98.3 F (36.8 C) (09/13 0530) Pulse Rate:  [55-74] 62 (09/13 0530) Resp:  [13-21] 18 (09/13 0530) BP: (109-144)/(50-83) 109/59 mmHg (09/13 0530) SpO2:  [96 %-100 %] 96 % (09/13 0530) Weight:  [182 lb (82.555 kg)-185 lb (83.915 kg)] 182 lb (82.555 kg) (09/13 0530) Physical Exam: General: NAD Cardiovascular: RRR  Respiratory: CTAB Abdomen: soft, non tender + BS Extremities: no  LE edema, non tender  Laboratory:  Recent Labs Lab 01/11/15 1530 01/11/15 2250  WBC 5.2 6.0  HGB 10.3* 10.7*  HCT 31.9* 32.5*  PLT 191 183    Recent Labs Lab 01/11/15 1530 01/11/15 2250  NA 141  --   K 4.2  --   CL 109  --   CO2 22  --   BUN 21*  --   CREATININE 1.09* 1.19*  CALCIUM 9.5  --   GLUCOSE 95  --       Imaging/Diagnostic Tests: IMPRESSION: No active cardiopulmonary disease.  Veatrice Bourbon, MD 01/12/2015, 9:52 AM PGY-2, [place] Intern pager: [phone], text pages welcome

## 2015-01-12 NOTE — Progress Notes (Signed)
Lexiscan MV performed. 1 day study. [place] to read.  Rosaria Ferries, Hershal Coria 01/12/2015 11:38 AM [place] [phone]

## 2015-01-12 NOTE — Consult Note (Addendum)
CONSULTATION NOTE  Reason for Consult: Chest pain  Requesting Physician: Dr. Pollie Meyer  Cardiologist: None (new)  HPI: This is a 69 y.o. female with a past medical history significant for hypertension, dyslipidemia and ongoing tobacco abuse (she started smoking in her 64s when she was in the [employer]). She presents with less than 24 hours of substernal chest pain that started at rest yesterday while at work. He was initially midepigastric and substernal and radiated upper chest to both sides of her neck. It did not cause a squeezing or pressure sensation in her neck or aching in her jaw. She also reported some radiation to her left arm after she woke up from a nap and she had taken 3 full dose aspirin. She continued to have persistent discomfort in the chest and called EMS who advised her to take additional aspirin. In route to the emergency department she was given nitroglycerin which took her pain from a 5/10 to a 2/10, and took an additional nitroglycerin which brought it down to a 1/10. This morning she is chest pain-free. Troponin is negative 3. EKG is mildly abnormal with sinus rhythm and nonspecific T wave changes anterolaterally which are flattened - those EKG changes persist today with PACs that were noted. There is no significant family history of coronary disease. She's never had reflux symptoms but she felt like this is what people typically described as reflux. She does have screening colonoscopy coming up at the beginning of next month with Dr. Christella Hartigan.  PMHx:  Past Medical History  Diagnosis Date  . Hypertension   . Tremors of nervous system   . Hyperlipidemia    Past Surgical History  Procedure Laterality Date  . Joint replacement    . Knee surgery    . Bone spur    . Rotator cuff repair      Bilateral    FAMHx: History reviewed. No pertinent family history. no family history of CAD.  SOCHx:  reports that she has been smoking.  She does not have any smokeless  tobacco history on file. She reports that she does not drink alcohol or use illicit drugs.  ALLERGIES: Allergies  Allergen Reactions  . Morphine And Related Itching    ROS: A comprehensive review of systems was negative except for: Cardiovascular: positive for chest pain Gastrointestinal: positive for reflux symptoms  HOME MEDICATIONS: Prescriptions prior to admission  Medication Sig Dispense Refill Last Dose  . aspirin 325 MG tablet Take 325 mg by mouth daily.   01/11/2015 at Unknown time  . hydrochlorothiazide (MICROZIDE) 12.5 MG capsule Take 12.5 mg by mouth every morning.   01/11/2015 at Unknown time  . primidone (MYSOLINE) 50 MG tablet Take 50 mg by mouth every morning.   Past Week at Unknown time  . propranolol (INDERAL) 80 MG tablet Take 80 mg by mouth 2 (two) times daily.     Past Week at 0800  . simvastatin (ZOCOR) 10 MG tablet Take 10 mg by mouth every morning.   01/11/2015 at Unknown time  . Vitamin D, Ergocalciferol, (DRISDOL) 50000 UNITS CAPS Take 50,000 Units by mouth every 7 (seven) days. sundays   01/10/2015 at Unknown time  . omeprazole (PRILOSEC) 20 MG capsule Take 1 capsule (20 mg total) by mouth daily. (Patient not taking: Reported on 01/11/2015) 20 capsule 0 Not Taking at Unknown time  . sucralfate (CARAFATE) 1 G tablet Take 1 tablet (1 g total) by mouth 4 (four) times daily -  with meals and at  bedtime. (Patient not taking: Reported on 01/11/2015) 28 tablet 0 Not Taking at Unknown time    HOSPITAL MEDICATIONS: I have reviewed the patient's current medications.  VITALS: Blood pressure 109/59, pulse 62, temperature 98.3 F (36.8 C), temperature source Oral, resp. rate 18, height 5' 9.5" (1.765 m), weight 182 lb (82.555 kg), SpO2 96 %.  PHYSICAL EXAM: General appearance: alert and no distress Lungs: clear to auscultation bilaterally Heart: regular rate and rhythm, S1, S2 normal, no murmur, click, rub or gallop Abdomen: soft, non-tender; bowel sounds normal; no masses,   no organomegaly Extremities: extremities normal, atraumatic, no cyanosis or edema Neurologic: Grossly normal  LABS: Results for orders placed or performed during the hospital encounter of 01/11/15 (from the past 48 hour(s))  Basic metabolic panel     Status: Abnormal   Collection Time: 01/11/15  3:30 PM  Result Value Ref Range   Sodium 141 135 - 145 mmol/L   Potassium 4.2 3.5 - 5.1 mmol/L   Chloride 109 101 - 111 mmol/L   CO2 22 22 - 32 mmol/L   Glucose, Bld 95 65 - 99 mg/dL   BUN 21 (H) 6 - 20 mg/dL   Creatinine, Ser 1.09 (H) 0.44 - 1.00 mg/dL   Calcium 9.5 8.9 - 10.3 mg/dL   GFR calc non Af Amer 50 (L) >60 mL/min   GFR calc Af Amer 58 (L) >60 mL/min    Comment: (NOTE) The eGFR has been calculated using the CKD EPI equation. This calculation has not been validated in all clinical situations. eGFR's persistently <60 mL/min signify possible Chronic Kidney Disease.    Anion gap 10 5 - 15  CBC     Status: Abnormal   Collection Time: 01/11/15  3:30 PM  Result Value Ref Range   WBC 5.2 4.0 - 10.5 K/uL   RBC 4.60 3.87 - 5.11 MIL/uL   Hemoglobin 10.3 (L) 12.0 - 15.0 g/dL   HCT 31.9 (L) 36.0 - 46.0 %   MCV 69.3 (L) 78.0 - 100.0 fL   MCH 22.4 (L) 26.0 - 34.0 pg   MCHC 32.3 30.0 - 36.0 g/dL   RDW 14.8 11.5 - 15.5 %   Platelets 191 150 - 400 K/uL  Troponin I     Status: None   Collection Time: 01/11/15  3:33 PM  Result Value Ref Range   Troponin I <0.03 <0.031 ng/mL    Comment:        NO INDICATION OF MYOCARDIAL INJURY.   I-stat troponin, ED     Status: None   Collection Time: 01/11/15  3:36 PM  Result Value Ref Range   Troponin i, poc 0.00 0.00 - 0.08 ng/mL   Comment 3            Comment: Due to the release kinetics of cTnI, a negative result within the first hours of the onset of symptoms does not rule out myocardial infarction with certainty. If myocardial infarction is still suspected, repeat the test at appropriate intervals.   Troponin I-serum (0, 3, 6 hours)      Status: None   Collection Time: 01/11/15 10:50 PM  Result Value Ref Range   Troponin I <0.03 <0.031 ng/mL    Comment:        NO INDICATION OF MYOCARDIAL INJURY.   CBC     Status: Abnormal   Collection Time: 01/11/15 10:50 PM  Result Value Ref Range   WBC 6.0 4.0 - 10.5 K/uL   RBC 4.68 3.87 - 5.11  MIL/uL   Hemoglobin 10.7 (L) 12.0 - 15.0 g/dL   HCT 32.5 (L) 36.0 - 46.0 %   MCV 69.4 (L) 78.0 - 100.0 fL   MCH 22.9 (L) 26.0 - 34.0 pg   MCHC 32.9 30.0 - 36.0 g/dL   RDW 14.8 11.5 - 15.5 %   Platelets 183 150 - 400 K/uL  Creatinine, serum     Status: Abnormal   Collection Time: 01/11/15 10:50 PM  Result Value Ref Range   Creatinine, Ser 1.19 (H) 0.44 - 1.00 mg/dL   GFR calc non Af Amer 45 (L) >60 mL/min   GFR calc Af Amer 52 (L) >60 mL/min    Comment: (NOTE) The eGFR has been calculated using the CKD EPI equation. This calculation has not been validated in all clinical situations. eGFR's persistently <60 mL/min signify possible Chronic Kidney Disease.   Troponin I-serum (0, 3, 6 hours)     Status: None   Collection Time: 01/12/15  3:59 AM  Result Value Ref Range   Troponin I <0.03 <0.031 ng/mL    Comment:        NO INDICATION OF MYOCARDIAL INJURY.     IMAGING: Dg Chest 2 View  01/11/2015   CLINICAL DATA:  Chest pain  EXAM: CHEST  2 VIEW  COMPARISON:  09/08/2013  FINDINGS: Cardiomediastinal silhouette is stable. No acute infiltrate or pleural effusion. No pulmonary edema. Mild degenerative changes thoracic spine.  IMPRESSION: No active cardiopulmonary disease.   Electronically Signed   By: Lahoma Crocker M.D.   On: 01/11/2015 16:08    HOSPITAL DIAGNOSES: Principal Problem:   Atypical chest pain Active Problems:   Hyperlipidemia   Benign essential HTN   Tobacco abuse   IMPRESSION: 1. Chest pain, with typical and atypical features, possibly GERD or esophageal spasm versus stable angina and/or coronary spasm  RECOMMENDATION: 1. Mrs. Catherine Lowery has a number of cardiac risk  factors and is a smoker in her 44s. She does have mild EKG changes including T-wave flattening laterally. Troponins have been negative. She had a positive response to nitroglycerin. I'm recommending a nuclear stress test today. If this is low risk she could be safely discharged and I'm happy to see her back in the office in follow-up. It would be worthwhile then further evaluating these symptoms with a gastroenterologist at her upcoming visit.  Thanks for the consultation.  Time Spent Directly with Patient: 30 minutes  Pixie Casino, MD, [facility] Attending Cardiologist [place] 01/12/2015, 8:23 AM

## 2015-01-28 ENCOUNTER — Telehealth: Payer: Self-pay | Admitting: *Deleted

## 2015-01-28 NOTE — Telephone Encounter (Signed)
Yes, should have GI office apt.  Prefer cardiology appt is first

## 2015-01-28 NOTE — Telephone Encounter (Signed)
Pt is scheduled for pre-visit on 02/01/15 for a recall colonoscopy on 02/15/15, pt was seen in ED for chest pain on 9/12 that was relieved with nitro and was observed over night, ekg showed mild changes but tropin levels were negative, stress test was performed and was negative EF was 47%,  pt is to follow-up with cardiologist and possibly GI, do you want to see pt in office prior to colonoscopy? Please advise  Thanks-adm

## 2015-01-28 NOTE — Telephone Encounter (Signed)
Called pt and advised Dr Ardis Hughs would like for her to be seen in the office prior to colonoscopy because of recent chest pain, she is to follow-up with cardiology on 02/04/15, we cancelled pre-visit and colonoscopy at this time but scheduled an OV with Jessica on 02/18/15 which will be after cardiologist follow-up.pt was understanding of plan and agreed -adm

## 2015-02-04 ENCOUNTER — Encounter: Payer: Self-pay | Admitting: Internal Medicine

## 2015-02-04 ENCOUNTER — Telehealth: Payer: Self-pay | Admitting: Internal Medicine

## 2015-02-04 ENCOUNTER — Ambulatory Visit (INDEPENDENT_AMBULATORY_CARE_PROVIDER_SITE_OTHER): Payer: Medicare HMO | Admitting: Internal Medicine

## 2015-02-04 VITALS — BP 110/82 | HR 72 | Ht 69.5 in | Wt 183.8 lb

## 2015-02-04 DIAGNOSIS — E0520 Thyrotoxicosis with toxic multinodular goiter without thyrotoxic crisis or storm: Secondary | ICD-10-CM | POA: Insufficient documentation

## 2015-02-04 DIAGNOSIS — R0789 Other chest pain: Secondary | ICD-10-CM

## 2015-02-04 DIAGNOSIS — E0590 Thyrotoxicosis, unspecified without thyrotoxic crisis or storm: Secondary | ICD-10-CM | POA: Diagnosis not present

## 2015-02-04 DIAGNOSIS — Z720 Tobacco use: Secondary | ICD-10-CM

## 2015-02-04 DIAGNOSIS — I519 Heart disease, unspecified: Secondary | ICD-10-CM

## 2015-02-04 DIAGNOSIS — E785 Hyperlipidemia, unspecified: Secondary | ICD-10-CM

## 2015-02-04 DIAGNOSIS — I10 Essential (primary) hypertension: Secondary | ICD-10-CM

## 2015-02-04 LAB — T4, FREE: FREE T4: 0.91 ng/dL (ref 0.80–1.80)

## 2015-02-04 LAB — LIPID PANEL
CHOL/HDL RATIO: 5.8 ratio — AB (ref <=5.0)
Cholesterol: 185 mg/dL (ref 125–200)
HDL: 32 mg/dL — ABNORMAL LOW (ref >=46)
LDL CALC: 121 mg/dL (ref <130)
Triglycerides: 161 mg/dL — ABNORMAL HIGH (ref <150)
VLDL: 32 mg/dL — AB (ref <30)

## 2015-02-04 LAB — TSH: TSH: 0.198 u[IU]/mL — ABNORMAL LOW (ref 0.350–4.500)

## 2015-02-04 LAB — T3: T3, Total: 146.5 ng/dL (ref 80.0–204.0)

## 2015-02-04 NOTE — Telephone Encounter (Signed)
Labs in MD basket for review

## 2015-02-04 NOTE — Progress Notes (Signed)
OFFICE NOTE  Chief Complaint:  Hospital follow-up  Primary Care Physician: Catherine Coop, PA-C  HPI:  Catherine Lowery returns today for hospital follow-up. She presented with atypical chest pain symptoms. She ruled-out for ACS and underwent a myoview, which was negative for ischemia, however, the EF was reduced to 47%. It should also be noted, that she was found to have a suppressed TSH at 0.045.  She has had no treatment for this. She reports no further chest pain today. She denies worsening shortness of breath. She has had worsening tremor. She gets hot flashes a lot and has been sweaty.   PMHx:  Past Medical History  Diagnosis Date  . Hypertension   . Tremors of nervous system   . Hyperlipidemia     Past Surgical History  Procedure Laterality Date  . Joint replacement    . Knee surgery    . Bone spur    . Rotator cuff repair      Bilateral    FAMHx:  No family history on file.  SOCHx:   reports that she has been smoking.  She does not have any smokeless tobacco history on file. She reports that she does not drink alcohol or use illicit drugs.  ALLERGIES:  Allergies  Allergen Reactions  . Morphine And Related Itching  . Other Nausea And Vomiting    Pain medications    ROS: A comprehensive review of systems was negative except for: Constitutional: positive for fatigue, sweats and hot flashes  HOME MEDS: Current Outpatient Prescriptions  Medication Sig Dispense Refill  . aspirin 325 MG tablet Take 325 mg by mouth daily.    . hydrochlorothiazide (MICROZIDE) 12.5 MG capsule Take 12.5 mg by mouth every morning.    . primidone (MYSOLINE) 50 MG tablet Take 50 mg by mouth every morning.    . propranolol (INDERAL) 80 MG tablet Take 80 mg by mouth 2 (two) times daily.      . simvastatin (ZOCOR) 10 MG tablet Take 10 mg by mouth every morning.    . sucralfate (CARAFATE) 1 G tablet Take 1 tablet (1 g total) by mouth 4 (four) times daily -  with meals and at bedtime.  (Patient not taking: Reported on 01/11/2015) 28 tablet 0  . Vitamin D, Ergocalciferol, (DRISDOL) 50000 UNITS CAPS Take 50,000 Units by mouth every 7 (seven) days. sundays     No current facility-administered medications for this visit.    LABS/IMAGING: Results for orders placed or performed in visit on 02/04/15 (from the past 48 hour(s))  TSH     Status: Abnormal   Collection Time: 02/04/15  9:55 AM  Result Value Ref Range   TSH 0.198 (L) 0.350 - 4.500 uIU/mL  T3     Status: None   Collection Time: 02/04/15  9:55 AM  Result Value Ref Range   T3, Total 146.5 80.0 - 204.0 ng/dL  T4, free     Status: None   Collection Time: 02/04/15  9:55 AM  Result Value Ref Range   Free T4 0.91 0.80 - 1.80 ng/dL  Lipid panel     Status: Abnormal   Collection Time: 02/04/15  9:55 AM  Result Value Ref Range   Cholesterol 185 125 - 200 mg/dL   Triglycerides 161 (H) <150 mg/dL   HDL 32 (L) >=46 mg/dL   Total CHOL/HDL Ratio 5.8 (H) <=5.0 Ratio   VLDL 32 (H) <30 mg/dL   LDL Cholesterol 121 <130 mg/dL    Comment:  Total Cholesterol/HDL Ratio:CHD Risk                        Coronary Heart Disease Risk Table                                        Men       Women          1/2 Average Risk              3.4        3.3              Average Risk              5.0        4.4           2X Average Risk              9.6        7.1           3X Average Risk             23.4       11.0 Use the calculated Patient Ratio above and the CHD Risk table  to determine the patient's CHD Risk.    No results found.  WEIGHTS: Wt Readings from Last 3 Encounters:  02/04/15 183 lb 12.8 oz (83.371 kg)  01/12/15 182 lb (82.555 kg)  09/08/13 182 lb (82.555 kg)    VITALS: BP 110/82 mmHg  Pulse 72  Ht 5' 9.5" (1.765 m)  Wt 183 lb 12.8 oz (83.371 kg)  BMI 26.76 kg/m2  EXAM: General appearance: alert and no distress Neck: no carotid bruit, no JVD and thyroid: enlarged Lungs: clear to auscultation bilaterally Heart:  regular rate and rhythm, S1, S2 normal, no murmur, click, rub or gallop Abdomen: soft, non-tender; bowel sounds normal; no masses,  no organomegaly Extremities: extremities normal, atraumatic, no cyanosis or edema Pulses: 2+ and symmetric Skin: Skin color, texture, turgor normal. No rashes or lesions Neurologic: Mental status: Alert, oriented, thought content appropriate, fine motor tremor is noted Psych: Pleasant  EKG: deferred  ASSESSMENT: 1. Atypical chest pain - negative myoview for ischemia 2. Reduced EF to 47% 3. Probable hyperthyroidism  PLAN: 1.   Catherine Lowery has a mildly suppressed LVEF of 47% on her myoview - it was negative for ischemia. I would like to check and echo to see if this is actually cardiomyopathy or a gating artifact. If her EF is reduced, it may be related to hyperthyroidism. I will plan to obtain a T3 and free T4.  Based on these results, we may elect to start her on tapazole and refer her to an endocrinologist. She is on aspirin, propranolol, HCTZ and simvastatin already.  Plan to see her back to discuss the results of the echo and bloodwork in a few weeks.  Pixie Casino, MD, [facility] Attending Cardiologist [place] [facility] [name] 02/04/2015, 6:16 PM

## 2015-02-04 NOTE — Telephone Encounter (Signed)
Thanks - Dr H 

## 2015-02-04 NOTE — Telephone Encounter (Signed)
Ebony Hail is faxing over stat labs for this patient. [place] fax number provided.

## 2015-02-04 NOTE — Patient Instructions (Signed)
Your physician has requested that you have an echocardiogram @ [address]. Echocardiography is a painless test that uses sound waves to create images of your heart. It provides your doctor with information about the size and shape of your heart and how well your heart's chambers and valves are working. This procedure takes approximately one hour. There are no restrictions for this procedure.  Your physician recommends that you return for lab work FASTING  Your physician recommends that you schedule a follow-up appointment in: [place] with Dr. Debara Pickett

## 2015-02-05 ENCOUNTER — Telehealth: Payer: Self-pay | Admitting: *Deleted

## 2015-02-05 DIAGNOSIS — E785 Hyperlipidemia, unspecified: Secondary | ICD-10-CM

## 2015-02-05 DIAGNOSIS — E0590 Thyrotoxicosis, unspecified without thyrotoxic crisis or storm: Secondary | ICD-10-CM

## 2015-02-05 MED ORDER — SIMVASTATIN 20 MG PO TABS: 20.0000 mg | ORAL_TABLET | Freq: Every morning | ORAL | Status: DC

## 2015-02-05 NOTE — Telephone Encounter (Signed)
-----   Message from Pixie Casino, MD sent at 02/04/2015  6:26 PM EDT ----- TSH is suppressed, but improved. Free T4 and T3 are normal. Cholesterol could be better. Please refer her to Dr. Pecola Lawless with endocrinology. Increase simvastatin to 20 mg daily.  Dr. Lemmie Evens

## 2015-02-05 NOTE — Telephone Encounter (Signed)
Spoke with pt, aware of labs. Patient voiced understanding of medication change. Referral placed.

## 2015-02-12 ENCOUNTER — Encounter: Payer: Self-pay | Admitting: Gastroenterology

## 2015-02-15 ENCOUNTER — Encounter: Payer: Medicare FFS | Admitting: Gastroenterology

## 2015-02-15 ENCOUNTER — Other Ambulatory Visit: Payer: Self-pay

## 2015-02-15 ENCOUNTER — Ambulatory Visit (HOSPITAL_COMMUNITY): Payer: Medicare HMO | Attending: Cardiovascular Disease

## 2015-02-15 DIAGNOSIS — I517 Cardiomegaly: Secondary | ICD-10-CM | POA: Diagnosis not present

## 2015-02-15 DIAGNOSIS — E785 Hyperlipidemia, unspecified: Secondary | ICD-10-CM | POA: Insufficient documentation

## 2015-02-15 DIAGNOSIS — F17200 Nicotine dependence, unspecified, uncomplicated: Secondary | ICD-10-CM | POA: Insufficient documentation

## 2015-02-15 DIAGNOSIS — I10 Essential (primary) hypertension: Secondary | ICD-10-CM | POA: Diagnosis not present

## 2015-02-15 DIAGNOSIS — I519 Heart disease, unspecified: Secondary | ICD-10-CM | POA: Diagnosis not present

## 2015-02-18 ENCOUNTER — Ambulatory Visit (INDEPENDENT_AMBULATORY_CARE_PROVIDER_SITE_OTHER): Payer: Medicare HMO | Admitting: Gastroenterology

## 2015-02-18 ENCOUNTER — Encounter: Payer: Self-pay | Admitting: Gastroenterology

## 2015-02-18 VITALS — BP 140/72 | Wt 182.6 lb

## 2015-02-18 DIAGNOSIS — Z1211 Encounter for screening for malignant neoplasm of colon: Secondary | ICD-10-CM | POA: Diagnosis not present

## 2015-02-18 MED ORDER — NA SULFATE-K SULFATE-MG SULF 17.5-3.13-1.6 GM/177ML PO SOLN: 1.0000 | Freq: Once | ORAL | Status: DC

## 2015-02-18 NOTE — Progress Notes (Signed)
02/18/2015 Catherine Lowery [id number] 1945-10-29   HISTORY OF PRESENT ILLNESS:  This is a 69 year old female who was scheduled for a screening colonoscopy with Dr. Ardis Hughs.  Her last colonoscopy was in 12/2004 at which time she was only found to have internal and external hemorrhoids.  She had been seen in the ED for chest pain on 9/12 at which time EKG showed mild changes but troponin levels were negative.  She followed up with cardiology and they think that her thyroid issues may have contributed to her chest pain, etc.  She is going to see an endocrinologist in a couple of weeks.  She also had an ECHO that showed EF of 55-60% and no other significant abnormalities that I can tell.  She denies any further chest pain.  Denies any GI complaints including dark or bloody stools.  Past Medical History  Diagnosis Date  . Hypertension   . Tremors of nervous system   . Hyperlipidemia   . Hyperthyroidism    Past Surgical History  Procedure Laterality Date  . Joint replacement    . Knee surgery    . Bone spur    . Rotator cuff repair Bilateral     reports that she has been smoking.  She does not have any smokeless tobacco history on file. She reports that she does not drink alcohol or use illicit drugs. family history is not on file. Allergies  Allergen Reactions  . Morphine And Related Itching  . Other Nausea And Vomiting    Pain medications      Outpatient Encounter Prescriptions as of 02/18/2015  Medication Sig  . aspirin 325 MG tablet Take 325 mg by mouth daily.  . hydrochlorothiazide (MICROZIDE) 12.5 MG capsule Take 12.5 mg by mouth every morning.  . primidone (MYSOLINE) 50 MG tablet Take 50 mg by mouth every morning.  . propranolol (INDERAL) 80 MG tablet Take 80 mg by mouth 2 (two) times daily.    . simvastatin (ZOCOR) 20 MG tablet Take 1 tablet (20 mg total) by mouth every morning.  . Vitamin D, Ergocalciferol, (DRISDOL) 50000 UNITS CAPS Take 50,000 Units by mouth every 7  (seven) days. sundays  . [DISCONTINUED] sucralfate (CARAFATE) 1 G tablet Take 1 tablet (1 g total) by mouth 4 (four) times daily -  with meals and at bedtime. (Patient not taking: Reported on 01/11/2015)   No facility-administered encounter medications on file as of 02/18/2015.     REVIEW OF SYSTEMS  : All other systems reviewed and negative except where noted in the History of Present Illness.   PHYSICAL EXAM: BP 140/72 mmHg  Wt 182 lb 9.6 oz (82.827 kg) General: Well developed black female in no acute distress Head: Normocephalic and atraumatic Eyes:  Sclerae anicteric, conjunctiva pink. Ears: Normal auditory acuity Lungs: Clear throughout to auscultation Heart: Regular rate and rhythm Abdomen: Soft, non-distended.  Normal bowel sounds.  Non-tender. Rectal:  Will be done at the time of colonoscopy. Musculoskeletal: Symmetrical with no gross deformities  Skin: No lesions on visible extremities Extremities: No edema  Neurological: Alert oriented x 4, grossly non-focal Psychological:  Alert and cooperative. Normal mood and affect  ASSESSMENT AND PLAN: -Screening colonoscopy:  Last was 10 years ago.  ECHO looks good and no major cardiac issues per cardiology.  No further chest pain.  Will schedule with Dr. Ardis Hughs.  The risks, benefits, and alternatives to colonoscopy were discussed with the patient and she consents to proceed.   CC:  No ref. provider found

## 2015-02-18 NOTE — Patient Instructions (Signed)

## 2015-02-18 NOTE — Progress Notes (Signed)
i agree with the above note, plan 

## 2015-02-26 ENCOUNTER — Encounter: Payer: Self-pay | Admitting: Gastroenterology

## 2015-02-26 ENCOUNTER — Ambulatory Visit (AMBULATORY_SURGERY_CENTER): Payer: Medicare HMO | Admitting: Gastroenterology

## 2015-02-26 VITALS — BP 113/69 | HR 58 | Temp 96.6°F | Resp 23 | Ht 69.5 in | Wt 182.0 lb

## 2015-02-26 DIAGNOSIS — Z1211 Encounter for screening for malignant neoplasm of colon: Secondary | ICD-10-CM

## 2015-02-26 MED ORDER — SODIUM CHLORIDE 0.9 % IV SOLN: 500.0000 mL | INTRAVENOUS | Status: DC

## 2015-02-26 NOTE — Op Note (Signed)
[facility]  [address] [place], [id number]   COLONOSCOPY PROCEDURE REPORT  PATIENT: Catherine, Lowery  MR#: [id number] BIRTHDATE: [birth year] , 34  yrs. old GENDER: female ENDOSCOPIST: Milus Banister, MD PROCEDURE DATE:  02/26/2015 PROCEDURE:   Colonoscopy, screening First Screening Colonoscopy - Avg.  risk and is 50 yrs.  old or older - No.  Prior Negative Screening - Now for repeat screening. 10 or more years since last screening  History of Adenoma - Now for follow-up colonoscopy & has been > or = to 3 yrs.  N/A  Recommend repeat exam, <10 yrs? No ASA CLASS:   Class II INDICATIONS:Screening for colonic neoplasia and Colorectal Neoplasm Risk Assessment for this procedure is average risk. MEDICATIONS: Monitored anesthesia care and Propofol 160 mg IV  DESCRIPTION OF PROCEDURE:   After the risks benefits and alternatives of the procedure were thoroughly explained, informed consent was obtained.  The digital rectal exam revealed no abnormalities of the rectum.   The LB [id number]  endoscope was introduced through the anus and advanced to the cecum, which was identified by both the appendix and ileocecal valve. No adverse events experienced.   The quality of the prep was excellent.  The instrument was then slowly withdrawn as the colon was fully examined. Estimated blood loss is zero unless otherwise noted in this procedure report.   COLON FINDINGS: A normal appearing cecum, ileocecal valve, and appendiceal orifice were identified.  The ascending, transverse, descending, sigmoid colon, and rectum appeared unremarkable. Retroflexed views revealed no abnormalities. The time to cecum = 4.4 Withdrawal time = 8.6   The scope was withdrawn and the procedure completed. COMPLICATIONS: There were no immediate complications.  ENDOSCOPIC IMPRESSION: Normal colonoscopy  RECOMMENDATIONS: You should continue to follow colorectal cancer screening guidelines for  "routine risk" patients with a repeat colonoscopy in 10 years.   eSigned:  Milus Banister, MD 02/26/2015 10:17 AM

## 2015-02-26 NOTE — Progress Notes (Signed)
Report to PACU, RN, vss, BBS= Clear.  

## 2015-02-26 NOTE — Patient Instructions (Addendum)
One of your biggest health concerns is your smoking.  This increases your risk for most cancers and serious cardiovascular diseases such as strokes, heart attacks.  You should try your best to stop.  If you need assistance, please contact your PCP or Smoking Cessation Class at [facility] [phone]) or [place] (1-800-QUIT-NOW).  Discharge instructions given. Normal exam. Resume previous medications. YOU HAD AN ENDOSCOPIC PROCEDURE TODAY AT [facility]:   Refer to the procedure report that was given to you for any specific questions about what was found during the examination.  If the procedure report does not answer your questions, please call your gastroenterologist to clarify.  If you requested that your care partner not be given the details of your procedure findings, then the procedure report has been included in a sealed envelope for you to review at your convenience later.  YOU SHOULD EXPECT: Some feelings of bloating in the abdomen. Passage of more gas than usual.  Walking can help get rid of the air that was put into your GI tract during the procedure and reduce the bloating. If you had a lower endoscopy (such as a colonoscopy or flexible sigmoidoscopy) you may notice spotting of blood in your stool or on the toilet paper. If you underwent a bowel prep for your procedure, you may not have a normal bowel movement for a few days.  Please Note:  You might notice some irritation and congestion in your nose or some drainage.  This is from the oxygen used during your procedure.  There is no need for concern and it should clear up in a day or so.  SYMPTOMS TO REPORT IMMEDIATELY:   Following lower endoscopy (colonoscopy or flexible sigmoidoscopy):  Excessive amounts of blood in the stool  Significant tenderness or worsening of abdominal pains  Swelling of the abdomen that is new, acute  Fever of 100F or higher   For urgent or emergent issues, a  gastroenterologist can be reached at any hour by calling [phone].   DIET: Your first meal following the procedure should be a small meal and then it is ok to progress to your normal diet. Heavy or fried foods are harder to digest and may make you feel nauseous or bloated.  Likewise, meals heavy in dairy and vegetables can increase bloating.  Drink plenty of fluids but you should avoid alcoholic beverages for 24 hours.  ACTIVITY:  You should plan to take it easy for the rest of today and you should NOT DRIVE or use heavy machinery until tomorrow (because of the sedation medicines used during the test).    FOLLOW UP: Our staff will call the number listed on your records the next business day following your procedure to check on you and address any questions or concerns that you may have regarding the information given to you following your procedure. If we do not reach you, we will leave a message.  However, if you are feeling well and you are not experiencing any problems, there is no need to return our call.  We will assume that you have returned to your regular daily activities without incident.  If any biopsies were taken you will be contacted by phone or by letter within the next 1-3 weeks.  Please call us at [phone] if you have not heard about the biopsies in 3 weeks.    SIGNATURES/CONFIDENTIALITY: You and/or your care partner have signed paperwork which will be entered into your electronic medical record.  These signatures attest to the fact that that the information above on your After Visit Summary has been reviewed and is understood.  Full responsibility of the confidentiality of this discharge information lies with you and/or your care-partner.

## 2015-03-01 ENCOUNTER — Telehealth: Payer: Self-pay | Admitting: *Deleted

## 2015-03-01 NOTE — Telephone Encounter (Signed)
  Follow up Call-  Call back number 02/26/2015  Post procedure Call Back phone  # [phone]  Permission to leave phone message Yes     Patient questions:  Do you have a fever, pain , or abdominal swelling? No. Pain Score  0 *  Have you tolerated food without any problems? Yes.    Have you been able to return to your normal activities? Yes.    Do you have any questions about your discharge instructions: Diet   No. Medications  No. Follow up visit  No.  Do you have questions or concerns about your Care? No.  Actions: * If pain score is 4 or above: No action needed, pain <4.

## 2015-03-02 ENCOUNTER — Encounter: Payer: Self-pay | Admitting: Internal Medicine

## 2015-03-02 ENCOUNTER — Ambulatory Visit (INDEPENDENT_AMBULATORY_CARE_PROVIDER_SITE_OTHER): Payer: Medicare HMO | Admitting: Internal Medicine

## 2015-03-02 VITALS — BP 110/64 | HR 65 | Temp 98.1°F | Resp 12 | Ht 69.0 in | Wt 182.4 lb

## 2015-03-02 DIAGNOSIS — E0590 Thyrotoxicosis, unspecified without thyrotoxic crisis or storm: Secondary | ICD-10-CM | POA: Diagnosis not present

## 2015-03-02 LAB — T3, FREE: T3 FREE: 4 pg/mL (ref 2.3–4.2)

## 2015-03-02 LAB — TSH: TSH: 0.2 u[IU]/mL — AB (ref 0.35–4.50)

## 2015-03-02 LAB — T4, FREE: FREE T4: 0.78 ng/dL (ref 0.60–1.60)

## 2015-03-02 NOTE — Patient Instructions (Signed)
Please stop at the lab.  Please return in 4 months.

## 2015-03-02 NOTE — Progress Notes (Signed)
Patient ID: Catherine Lowery, female   DOB: Mar 10, 1946, 69 y.o.   MRN: [id number]   HPI  Catherine Lowery is a 69 y.o.-year-old female, referred by her cardiologist, Dr. 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  Pt was found to have a low TSH at the time of a recent hospitalization for CP. She r/o for an AMI. She had a follow up visit with Dr Debara Pickett >> a TSH was repeated >> better, but still low.  I reviewed pt's thyroid tests (TSH 12/2014 was obtained inhouse): Lab Results  Component Value Date   TSH 0.198* 02/04/2015   TSH 0.045* 01/12/2015   FREET4 0.91 02/04/2015    Pt denies feeling nodules in neck, hoarseness, dysphagia/odynophagia, SOB with lying down; she c/o: - no fatigue - + hot flushes - since menopause  - + tremors (increased) - father and uncles have it also; sees neurology (Dr Amalia Hailey [facility]) - no anxiety - no palpitations - no hyperdefecation - no weight loss - no hair loss  Pt does not have a FH of thyroid ds other the thyroid cancer In sister. No h/o radiation tx to head or neck.  No seaweed or kelp, no recent contrast studies. No steroid use. No herbal supplements. No Biotin use.  ROS: Constitutional: + see HPI Eyes: no blurry vision, no xerophthalmia ENT: no sore throat, no nodules palpated in throat, no dysphagia/odynophagia, no hoarseness Cardiovascular: no CP/SOB/palpitations/leg swelling Respiratory: no cough/SOB Gastrointestinal: no N/V/D/C Musculoskeletal: no muscle/joint aches Skin: no rashes Neurological: no tremors/numbness/tingling/dizziness Psychiatric: no depression/anxiety  Past Medical History  Diagnosis Date  . Hypertension   . Tremors of nervous system   . Hyperlipidemia   . Hyperthyroidism    Past Surgical History  Procedure Laterality Date  . Joint replacement    . Knee surgery    . Bone spur    . Rotator cuff repair Bilateral    Social History   Social History  . Marital Status: Married    Spouse Name:  N/A  . Number of Children: 1   Occupational History   retired   Social History Main Topics  . Smoking status: Current Every Day Smoker -- 0.50 packs/day for 20 years  . Smokeless tobacco: Not on file  . Alcohol Use: No  . Drug Use: No   Current Outpatient Prescriptions on File Prior to Visit  Medication Sig Dispense Refill  . aspirin 325 MG tablet Take 325 mg by mouth daily.    . hydrochlorothiazide (MICROZIDE) 12.5 MG capsule Take 12.5 mg by mouth every morning.    . primidone (MYSOLINE) 50 MG tablet Take 50 mg by mouth every morning.    . propranolol (INDERAL) 80 MG tablet Take 80 mg by mouth 2 (two) times daily.      . simvastatin (ZOCOR) 20 MG tablet Take 1 tablet (20 mg total) by mouth every morning. 30 tablet 6  . Vitamin D, Ergocalciferol, (DRISDOL) 50000 UNITS CAPS Take 50,000 Units by mouth every 7 (seven) days. sundays     No current facility-administered medications on file prior to visit.   Allergies  Allergen Reactions  . Morphine And Related Itching  . Other Nausea And Vomiting    Pain medications   Family History  Problem Relation Age of Onset  . Hypertension Mother   . Heart disease Mother   . Cancer Father   + see HPI  PE: BP 110/64 mmHg  Pulse 65  Temp(Src) 98.1 F (36.7 C) (Oral)  Resp 12  Ht 5\' 9"  (1.753 m)  Wt 182 lb 6.4 oz (82.736 kg)  BMI 26.92 kg/m2  SpO2 99% Wt Readings from Last 3 Encounters:  03/02/15 182 lb 6.4 oz (82.736 kg)  02/26/15 182 lb (82.555 kg)  02/18/15 182 lb 9.6 oz (82.827 kg)   Constitutional: overweight, in NAD Eyes: PERRLA, EOMI, no exophthalmos, no lid lag, no stare ENT: moist mucous membranes, + slight symmetric thyromegaly, no thyroid bruits, no cervical lymphadenopathy Cardiovascular: RRR, No MRG Respiratory: CTA B Gastrointestinal: abdomen soft, NT, ND, BS+ Musculoskeletal: no deformities, strength intact in all 4 Skin: moist, warm, no rashes Neurological: ++ tremor with outstretched hands, DTR normal in all  4  ASSESSMENT: 1. Thyrotoxicosis  PLAN:  1. Patient with a recently found low TSH, without thyrotoxic sxs: no weight loss, heat intolerance (has long standing hot flushes), hyperdefecation, palpitations, anxiety.  - she does not appear to have exogenous causes for the low TSH.  - We discussed that possible causes of thyrotoxicosis are:  Graves ds   Thyroiditis toxic multinodular goiter/ toxic adenoma (I cannot feel nodules at palpation of her thyroid). - I suggested that we check the TSH, fT3 and fT4 and also add thyroid stimulating antibodies to screen for Graves' disease.  - If the tests remain abnormal, we may need an uptake and scan to differentiate between the 3 above possible etiologies  - we discussed about possible modalities of treatment for the above conditions, to include methimazole use, radioactive iodine ablation or (last resort) surgery. - we might need to do thyroid ultrasound depending on the results of the uptake and scan (if a cold nodule is present) - she is on a beta blocker - per cardiology  - I advised her to join my chart to communicate easier - refuses - RTC in 4 months, but likely sooner for repeat labs  Office Visit on 03/02/2015  Component Date Value Ref Range Status  . Free T4 03/02/2015 0.78  0.60 - 1.60 ng/dL Final  . T3, Free 03/02/2015 4.0  2.3 - 4.2 pg/mL Final  . TSH 03/02/2015 0.20* 0.35 - 4.50 uIU/mL Final  TSI's still pending.  TSH is still minimally low. Free T4 and free T3 normal. I would suggest to repeat the thyroid tests in a month and check a thyroid uptake and scan at that time, if not improving.

## 2015-03-05 LAB — THYROID STIMULATING IMMUNOGLOBULIN: TSI: 51 %{baseline} (ref <140)

## 2015-03-15 ENCOUNTER — Encounter: Payer: Self-pay | Admitting: Internal Medicine

## 2015-03-15 ENCOUNTER — Ambulatory Visit (INDEPENDENT_AMBULATORY_CARE_PROVIDER_SITE_OTHER): Payer: Medicare HMO | Admitting: Internal Medicine

## 2015-03-15 VITALS — BP 112/72 | HR 72 | Ht 69.5 in | Wt 181.6 lb

## 2015-03-15 DIAGNOSIS — I10 Essential (primary) hypertension: Secondary | ICD-10-CM | POA: Diagnosis not present

## 2015-03-15 DIAGNOSIS — R0789 Other chest pain: Secondary | ICD-10-CM

## 2015-03-15 DIAGNOSIS — E785 Hyperlipidemia, unspecified: Secondary | ICD-10-CM | POA: Diagnosis not present

## 2015-03-15 DIAGNOSIS — E0580 Other thyrotoxicosis without thyrotoxic crisis or storm: Secondary | ICD-10-CM | POA: Diagnosis not present

## 2015-03-15 NOTE — Progress Notes (Signed)
OFFICE NOTE  Chief Complaint:  No complaints  Primary Care Physician: Tereasa Coop, PA-C  HPI:  Catherine Lowery returns today for hospital follow-up. She presented with atypical chest pain symptoms. She ruled-out for ACS and underwent a myoview, which was negative for ischemia, however, the EF was reduced to 47%. It should also be noted, that she was found to have a suppressed TSH at 0.045.  She has had no treatment for this. She reports no further chest pain today. She denies worsening shortness of breath. She has had worsening tremor. She gets hot flashes a lot and has been sweaty.   Catherine Lowery returns today for follow-up. Overall she feels well. She denies any significant palpitations on propranolol. She continues to take aspirin 325 mg daily. Blood pressure is well-controlled. She is seeing Dr. Cruzita Lederer for workup of a suppressed TSH however T3 and T4 have been fairly normal. Laboratory work for graves disease and other possible etiologies are still in the works. She's not currently on any medication.  PMHx:  Past Medical History  Diagnosis Date  . Hypertension   . Tremors of nervous system   . Hyperlipidemia   . Hyperthyroidism     Past Surgical History  Procedure Laterality Date  . Joint replacement    . Knee surgery    . Bone spur    . Rotator cuff repair Bilateral     FAMHx:  Family History  Problem Relation Age of Onset  . Hypertension Mother   . Heart disease Mother   . Cancer Father     SOCHx:   reports that she has been smoking.  She does not have any smokeless tobacco history on file. She reports that she does not drink alcohol or use illicit drugs.  ALLERGIES:  Allergies  Allergen Reactions  . Morphine And Related Itching  . Other Nausea And Vomiting    Pain medications    ROS: A comprehensive review of systems was negative except for: Constitutional: positive for fatigue, sweats and hot flashes  HOME MEDS: Current Outpatient Prescriptions    Medication Sig Dispense Refill  . aspirin 325 MG tablet Take 325 mg by mouth daily.    . hydrochlorothiazide (MICROZIDE) 12.5 MG capsule Take 12.5 mg by mouth every morning.    . primidone (MYSOLINE) 50 MG tablet Take 50 mg by mouth every morning.    . propranolol (INDERAL) 80 MG tablet Take 80 mg by mouth 2 (two) times daily.      . simvastatin (ZOCOR) 20 MG tablet Take 1 tablet (20 mg total) by mouth every morning. 30 tablet 6  . Vitamin D, Ergocalciferol, (DRISDOL) 50000 UNITS CAPS Take 50,000 Units by mouth every 7 (seven) days. sundays     No current facility-administered medications for this visit.    LABS/IMAGING: No results found for this or any previous visit (from the past 48 hour(s)). No results found.  WEIGHTS: Wt Readings from Last 3 Encounters:  03/15/15 181 lb 9 oz (82.356 kg)  03/02/15 182 lb 6.4 oz (82.736 kg)  02/26/15 182 lb (82.555 kg)    VITALS: BP 112/72 mmHg  Pulse 72  Ht 5' 9.5" (1.765 m)  Wt 181 lb 9 oz (82.356 kg)  BMI 26.44 kg/m2  EXAM: General appearance: alert and no distress Neck: no carotid bruit, no JVD and thyroid: enlarged Lungs: clear to auscultation bilaterally Heart: regular rate and rhythm, S1, S2 normal, no murmur, click, rub or gallop Abdomen: soft, non-tender; bowel sounds normal; no  masses,  no organomegaly Extremities: extremities normal, atraumatic, no cyanosis or edema Pulses: 2+ and symmetric Skin: Skin color, texture, turgor normal. No rashes or lesions Neurologic: Mental status: Alert, oriented, thought content appropriate, fine motor tremor is noted Psych: Pleasant  EKG: deferred  ASSESSMENT: 1. Atypical chest pain - negative myoview for ischemia 2. Normal LV function by echo, however EF 47% by Myoview (suspect gating abnormality) 3. Subclinical hyperthyroidism  PLAN: 1.   Catherine Lowery had a negative Myoview for ischemia and atypical chest pain symptoms which have resolved. Her echo shows normal systolic function  and mild diastolic dysfunction. I suspect there is a gating abnormality on her nuclear stress test. She does remain on aspirin, HCTZ, simvastatin and propranolol. This is good medical therapy if she were to have some coronary disease. I'm very reassured by those findings a do not feel any further workup is necessary. I would like to see her back in one year for follow-up and if she remains stable we may not need to do any further workup.  Pixie Casino, MD, [facility] Attending Cardiologist [place] C [name] 03/15/2015, 1:35 PM

## 2015-03-15 NOTE — Patient Instructions (Signed)
Your physician wants you to follow-up in: 1 year with Dr. [name]. You will receive a reminder letter in the mail two months in advance. If you don't receive a letter, please call our office to schedule the follow-up appointment.  

## 2015-06-30 ENCOUNTER — Ambulatory Visit (INDEPENDENT_AMBULATORY_CARE_PROVIDER_SITE_OTHER): Payer: Medicare HMO | Admitting: Internal Medicine

## 2015-06-30 ENCOUNTER — Encounter: Payer: Self-pay | Admitting: Internal Medicine

## 2015-06-30 VITALS — BP 112/64 | HR 65 | Temp 98.4°F | Resp 12 | Wt 180.0 lb

## 2015-06-30 DIAGNOSIS — E0590 Thyrotoxicosis, unspecified without thyrotoxic crisis or storm: Secondary | ICD-10-CM | POA: Diagnosis not present

## 2015-06-30 LAB — T4, FREE: Free T4: 0.85 ng/dL (ref 0.60–1.60)

## 2015-06-30 LAB — T3, FREE: T3, Free: 3.3 pg/mL (ref 2.3–4.2)

## 2015-06-30 LAB — TSH: TSH: 0.33 u[IU]/mL — AB (ref 0.35–4.50)

## 2015-06-30 NOTE — Progress Notes (Signed)
Patient ID: Catherine Lowery, female   DOB: [birth year], 70 y.o.   MRN: [id number]   HPI  Catherine Lowery is a 70 y.o.-year-old female, initially referred by her cardiologist, Dr. 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 Last visit 4 mo ago.  Reviewed hx: Pt was found to have a low TSH at the time of a recent hospitalization for CP in 12/2014. She r/o for an AMI. She had a follow up visit with Dr Debara Pickett >> a TSH was repeated >> better, but still low.  I reviewed pt's latest thyroid tests - from last visit:  Office Visit on 03/02/2015  Component Date Value Ref Range Status  . Free T4 03/02/2015 0.78  0.60 - 1.60 ng/dL Final  . T3, Free 03/02/2015 4.0  2.3 - 4.2 pg/mL Final  . TSH 03/02/2015 0.20* 0.35 - 4.50 uIU/mL Final  . TSI 03/02/2015 51  <140 % baseline Final   TSH was minimally low. Free T4 and free T3 were normal and TSI Ab's were not high. I suggested to repeat the thyroid tests in a month and check a thyroid uptake and scan at that time, if not improving. She did not come back for the labs...   Reviewed all TFTs available in Epic: Lab Results  Component Value Date   TSH 0.20* 03/02/2015   TSH 0.198* 02/04/2015   TSH 0.045* 01/12/2015   FREET4 0.78 03/02/2015   FREET4 0.91 02/04/2015    Pt denies feeling nodules in neck, hoarseness, dysphagia/odynophagia, SOB with lying down; she c/o: - no fatigue - + hot flushes - since menopause, but now worse - + tremors  - father and uncles have it also; sees neurology (Dr Am[facility]) - no anxiety - no palpitations - no hyperdefecation - no weight loss - no hair loss  Pt does not have a FH of thyroid ds other the thyroid cancer In sister. No h/o radiation tx to head or neck.  No seaweed or kelp, no recent contrast studies. No steroid use. No herbal supplements. No Biotin use.  ROS: Constitutional: + see HPI Eyes: no blurry vision, no xerophthalmia ENT: no sore throat, no nodules palpated in throat, no  dysphagia/odynophagia, no hoarseness Cardiovascular: no CP/SOB/palpitations/leg swelling Respiratory: no cough/SOB Gastrointestinal: no N/V/D/C Musculoskeletal: no muscle/joint aches Skin: no rashes Neurological: no tremors/numbness/tingling/dizziness  I reviewed pt's medications, allergies, PMH, social hx, family hx, and changes were documented in the history of present illness. Otherwise, unchanged from my initial visit note.  Past Medical History  Diagnosis Date  . Hypertension   . Tremors of nervous system   . Hyperlipidemia   . Hyperthyroidism    Past Surgical History  Procedure Laterality Date  . Joint replacement    . Knee surgery    . Bone spur    . Rotator cuff repair Bilateral    Social History   Social History  . Marital Status: Married    Spouse Name: N/A  . Number of Children: 1   Occupational History   retired   Social History Main Topics  . Smoking status: Current Every Day Smoker -- 0.50 packs/day for 20 years  . Smokeless tobacco: Not on file  . Alcohol Use: No  . Drug Use: No   Current Outpatient Prescriptions on File Prior to Visit  Medication Sig Dispense Refill  . aspirin 325 MG tablet Take 325 mg by mouth daily.    . hydrochlorothiazide (MICROZIDE) 12.5 MG capsule Take 12.5 mg by mouth every morning.    Marland Kitchen  primidone (MYSOLINE) 50 MG tablet Take 50 mg by mouth every morning.    . propranolol (INDERAL) 80 MG tablet Take 80 mg by mouth 2 (two) times daily.      . simvastatin (ZOCOR) 20 MG tablet Take 1 tablet (20 mg total) by mouth every morning. 30 tablet 6  . Vitamin D, Ergocalciferol, (DRISDOL) 50000 UNITS CAPS Take 50,000 Units by mouth every 7 (seven) days. sundays     No current facility-administered medications on file prior to visit.   Allergies  Allergen Reactions  . Morphine And Related Itching  . Other Nausea And Vomiting    Pain medications   Family History  Problem Relation Age of Onset  . Hypertension Mother   . Heart disease  Mother   . Cancer Father   + see HPI  PE: BP 112/64 mmHg  Pulse 65  Temp(Src) 98.4 F (36.9 C) (Oral)  Resp 12  Wt 180 lb (81.647 kg)  SpO2 96% Body mass index is 26.21 kg/(m^2). Wt Readings from Last 3 Encounters:  06/30/15 180 lb (81.647 kg)  03/15/15 181 lb 9 oz (82.356 kg)  03/02/15 182 lb 6.4 oz (82.736 kg)   Constitutional: normal weight, in NAD Eyes: PERRLA, EOMI, no exophthalmos, no lid lag, no stare ENT: moist mucous membranes, + slight symmetric thyromegaly, no thyroid bruits, no cervical lymphadenopathy Cardiovascular: RRR, No MRG Respiratory: CTA B Gastrointestinal: abdomen soft, NT, ND, BS+ Musculoskeletal: no deformities, strength intact in all 4 Skin: moist, warm, no rashes Neurological: + tremor with outstretched hands, DTR normal in all 4  ASSESSMENT: 1. Subclinical Thyrotoxicosis   2. Goiter  PLAN:  1. Patient with a slightly low TSH, with normal TSIs and free T3/free T4, without thyrotoxic sxs: no weight loss, heat intolerance (has long standing hot flushes), hyperdefecation, palpitations, anxiety.  At last visit, since the TSH was slightly lower than the lower limit of normal, she was asymptomatic and her free hormones were normal, we decided to just repeat the thyroid tests in a month  And intervene at that time, if still abnormal. She did not return to have these labs done. Will check them today. - she does not appear to have exogenous causes for the low TSH.  - We again discussed that possible causes of thyrotoxicosis are:  Graves ds   Thyroiditis toxic multinodular goiter/ toxic adenoma  - If the tests remain abnormal, we may need an uptake and scan to differentiate between the 3 above possible etiologies  - she is on a beta blocker - per cardiology  - I advised her to join my chart to communicate easier - refuses - RTC in 4 months, but likely sooner for repeat labs  2. Goiter - may need a thyroid U/S if the results are normal  Component      Latest Ref Rng 06/30/2015  TSH     0.35 - 4.50 uIU/mL 0.33 (L)  T4,Free(Direct)     0.60 - 1.60 ng/dL 0.85  Triiodothyronine,Free,Serum     2.3 - 4.2 pg/mL 3.3    TFTs much improved, with a TSH only slightly lower than the lower limit of normal and normal free T4 and free T3. I will recheck them at next visit in 4 months and will decide about the thyroid ultrasound at that time.

## 2015-06-30 NOTE — Patient Instructions (Signed)
Please stop at the lab.  Please come back for a follow-up appointment in 4 months.   

## 2015-10-13 ENCOUNTER — Other Ambulatory Visit: Payer: Self-pay | Admitting: Internal Medicine

## 2015-10-30 ENCOUNTER — Encounter (HOSPITAL_COMMUNITY): Payer: Self-pay | Admitting: *Deleted

## 2015-10-30 ENCOUNTER — Emergency Department (HOSPITAL_COMMUNITY): Payer: Medicare HMO

## 2015-10-30 ENCOUNTER — Emergency Department (HOSPITAL_COMMUNITY)
Admission: EM | Admit: 2015-10-30 | Discharge: 2015-10-30 | Disposition: A | Discharge status: Home / Self Care | Payer: Medicare HMO | Attending: Emergency Medicine | Admitting: Emergency Medicine

## 2015-10-30 DIAGNOSIS — N281 Cyst of kidney, acquired: Secondary | ICD-10-CM | POA: Diagnosis not present

## 2015-10-30 DIAGNOSIS — J189 Pneumonia, unspecified organism: Secondary | ICD-10-CM

## 2015-10-30 DIAGNOSIS — R109 Unspecified abdominal pain: Secondary | ICD-10-CM | POA: Diagnosis present

## 2015-10-30 DIAGNOSIS — R0781 Pleurodynia: Secondary | ICD-10-CM | POA: Insufficient documentation

## 2015-10-30 DIAGNOSIS — Z7982 Long term (current) use of aspirin: Secondary | ICD-10-CM | POA: Insufficient documentation

## 2015-10-30 DIAGNOSIS — F17200 Nicotine dependence, unspecified, uncomplicated: Secondary | ICD-10-CM | POA: Diagnosis not present

## 2015-10-30 DIAGNOSIS — K7689 Other specified diseases of liver: Secondary | ICD-10-CM | POA: Diagnosis not present

## 2015-10-30 DIAGNOSIS — I10 Essential (primary) hypertension: Secondary | ICD-10-CM | POA: Diagnosis not present

## 2015-10-30 HISTORY — DX: Pneumonia, unspecified organism: J18.9

## 2015-10-30 LAB — CBC WITH DIFFERENTIAL/PLATELET
BASOS ABS: 0 10*3/uL (ref 0.0–0.1)
BASOS PCT: 0 %
EOS ABS: 0.1 10*3/uL (ref 0.0–0.7)
Eosinophils Relative: 2 %
HEMATOCRIT: 36.4 % (ref 36.0–46.0)
HEMOGLOBIN: 12 g/dL (ref 12.0–15.0)
LYMPHS PCT: 19 %
Lymphs Abs: 1.3 10*3/uL (ref 0.7–4.0)
MCH: 22.9 pg — ABNORMAL LOW (ref 26.0–34.0)
MCHC: 33 g/dL (ref 30.0–36.0)
MCV: 69.3 fL — ABNORMAL LOW (ref 78.0–100.0)
MONOS PCT: 9 %
Monocytes Absolute: 0.6 10*3/uL (ref 0.1–1.0)
NEUTROS ABS: 4.9 10*3/uL (ref 1.7–7.7)
NEUTROS PCT: 70 %
Platelets: 136 10*3/uL — ABNORMAL LOW (ref 150–400)
RBC: 5.25 MIL/uL — ABNORMAL HIGH (ref 3.87–5.11)
RDW: 14.4 % (ref 11.5–15.5)
WBC: 6.9 10*3/uL (ref 4.0–10.5)

## 2015-10-30 LAB — BASIC METABOLIC PANEL
ANION GAP: 8 (ref 5–15)
BUN: 19 mg/dL (ref 6–20)
CHLORIDE: 105 mmol/L (ref 101–111)
CO2: 25 mmol/L (ref 22–32)
CREATININE: 1.17 mg/dL — AB (ref 0.44–1.00)
Calcium: 9.4 mg/dL (ref 8.9–10.3)
GFR calc non Af Amer: 46 mL/min — ABNORMAL LOW (ref >60)
GFR, EST AFRICAN AMERICAN: 53 mL/min — AB (ref >60)
Glucose, Bld: 95 mg/dL (ref 65–99)
POTASSIUM: 3.8 mmol/L (ref 3.5–5.1)
SODIUM: 138 mmol/L (ref 135–145)

## 2015-10-30 LAB — I-STAT TROPONIN, ED: TROPONIN I, POC: 0 ng/mL (ref 0.00–0.08)

## 2015-10-30 MED ORDER — CEPHALEXIN 250 MG PO CAPS
250.0000 mg | ORAL_CAPSULE | Freq: Once | ORAL | Status: AC
  Administered 2015-10-30: 250 mg via ORAL
  Filled 2015-10-30: qty 1

## 2015-10-30 MED ORDER — CEPHALEXIN 500 MG PO CAPS: 500.0000 mg | ORAL_CAPSULE | Freq: Four times a day (QID) | ORAL | Status: DC

## 2015-10-30 MED ORDER — AZITHROMYCIN 250 MG PO TABS: 250.0000 mg | ORAL_TABLET | Freq: Every day | ORAL | Status: DC

## 2015-10-30 MED ORDER — ONDANSETRON HCL 4 MG/2ML IJ SOLN
4.0000 mg | Freq: Once | INTRAMUSCULAR | Status: AC
  Administered 2015-10-30: 4 mg via INTRAVENOUS
  Filled 2015-10-30: qty 2

## 2015-10-30 MED ORDER — HYDROCODONE-HOMATROPINE 5-1.5 MG/5ML PO SYRP: 5.0000 mL | ORAL_SOLUTION | Freq: Four times a day (QID) | ORAL | Status: DC | PRN

## 2015-10-30 MED ORDER — HYDROMORPHONE HCL 1 MG/ML IJ SOLN
1.0000 mg | Freq: Once | INTRAMUSCULAR | Status: AC
  Administered 2015-10-30: 1 mg via INTRAVENOUS
  Filled 2015-10-30: qty 1

## 2015-10-30 MED ORDER — AZITHROMYCIN 250 MG PO TABS
500.0000 mg | ORAL_TABLET | Freq: Once | ORAL | Status: AC
  Administered 2015-10-30: 500 mg via ORAL
  Filled 2015-10-30: qty 2

## 2015-10-30 NOTE — ED Notes (Signed)
The pt is c/o lt lateral rib pain  Since yesterday the pain is mid way of her rib cage.  She has some sob with the pain she has a cough  Non-productive.  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

## 2015-10-30 NOTE — ED Notes (Signed)
Patient transported to CT 

## 2015-10-30 NOTE — ED Provider Notes (Signed)
CSN: [id number]     Arrival date & time 10/30/15  1648 History   First MD Initiated Contact with Patient 10/30/15 1752     Chief Complaint  Patient presents with  . lt rib pain      (Consider location/radiation/quality/duration/timing/severity/associated sxs/prior Treatment) HPI Comments: Patient presents to the emergency department with chief complaint of left flank pain. He states that she had sudden onset severe pain earlier this morning. She states that it has been constant. She reports associated dysuria, but denies any hematuria. Additionally, she states she has had some associated shortness of breath, but things may be secondary to pain. She reports having a nonproductive cough. She has not taken anything for her symptoms. There are no modifying factors.  The history is provided by the patient. No language interpreter was used.    Past Medical History  Diagnosis Date  . Hypertension   . Tremors of nervous system   . Hyperlipidemia   . Hyperthyroidism    Past Surgical History  Procedure Laterality Date  . Joint replacement    . Knee surgery    . Bone spur    . Rotator cuff repair Bilateral    Family History  Problem Relation Age of Onset  . Hypertension Mother   . Heart disease Mother   . Cancer Father    Social History  Substance Use Topics  . Smoking status: Current Every Day Smoker -- 0.50 packs/day for 20 years  . Smokeless tobacco: None  . Alcohol Use: No   OB History    No data available     Review of Systems  Constitutional: Negative for fever and chills.  Respiratory: Positive for shortness of breath.   Cardiovascular: Negative for chest pain.  Gastrointestinal: Negative for nausea, vomiting, diarrhea and constipation.  Genitourinary: Positive for dysuria and flank pain.  All other systems reviewed and are negative.     Allergies  Morphine and related and Other  Home Medications   Prior to Admission medications   Medication Sig Start Date End  Date Taking? Authorizing Provider  aspirin 325 MG tablet Take 325 mg by mouth daily.    Historical Provider, MD  hydrochlorothiazide (MICROZIDE) 12.5 MG capsule Take 12.5 mg by mouth every morning. 08/27/13   Historical Provider, MD  primidone (MYSOLINE) 50 MG tablet Take 100 mg by mouth every morning.  08/27/13   Historical Provider, MD  propranolol (INDERAL) 80 MG tablet Take 80 mg by mouth 2 (two) times daily.      Historical Provider, MD  simvastatin (ZOCOR) 20 MG tablet TAKE 1 TABLET EVERY DAY IN THE MORNING 10/13/15   Pixie Casino, MD  Vitamin D, Ergocalciferol, (DRISDOL) 50000 UNITS CAPS Take 50,000 Units by mouth every 7 (seven) days. sundays    Historical Provider, MD   BP 157/77 mmHg  Pulse 90  Temp(Src) 97.9 F (36.6 C) (Oral)  Resp 26  SpO2 98% Physical Exam  Constitutional: She is oriented to person, place, and time. She appears well-developed and well-nourished.  HENT:  Head: Normocephalic and atraumatic.  Eyes: Conjunctivae and EOM are normal. Pupils are equal, round, and reactive to light.  Neck: Normal range of motion. Neck supple.  Cardiovascular: Normal rate and regular rhythm.  Exam reveals no gallop and no friction rub.   No murmur heard. Pulmonary/Chest: Effort normal and breath sounds normal. No respiratory distress. She has no wheezes. She has no rales. She exhibits no tenderness.  Abdominal: Soft. Bowel sounds are normal. She exhibits no  distension and no mass. There is no tenderness. There is no rebound and no guarding.  Left sided CVA tenderness No focal abdominal tenderness, no RLQ tenderness or pain at McBurney's point, no RUQ tenderness or Murphy's sign, no left-sided abdominal tenderness, no fluid wave, or signs of peritonitis   Musculoskeletal: Normal range of motion. She exhibits no edema or tenderness.  Neurological: She is alert and oriented to person, place, and time.  Skin: Skin is warm and dry.  Psychiatric: She has a normal mood and affect. Her  behavior is normal. Judgment and thought content normal.  Nursing note and vitals reviewed.   ED Course  Procedures (including critical care time) Labs Review Labs Reviewed  CBC WITH DIFFERENTIAL/PLATELET - Abnormal; Notable for the following:    RBC 5.25 (*)    MCV 69.3 (*)    MCH 22.9 (*)    Platelets 136 (*)    All other components within normal limits  BASIC METABOLIC PANEL - Abnormal; Notable for the following:    Creatinine, Ser 1.17 (*)    GFR calc non Af Amer 46 (*)    GFR calc Af Amer 53 (*)    All other components within normal limits  URINALYSIS, ROUTINE W REFLEX MICROSCOPIC (NOT AT [facility])  I-STAT TROPOININ, ED    Imaging Review Dg Ribs Unilateral W/chest Left  10/30/2015  CLINICAL DATA:  Anterior rib pain with shortness of breath. EXAM: LEFT RIBS AND CHEST - 3+ VIEW COMPARISON:  01/11/2015 FINDINGS: Frontal chest shows right base chronic atelectasis or scarring, as before. No evidence for focal airspace consolidation, pulmonary edema, or pleural effusion. Oblique views of the left ribs were obtained. Radio-opaque marker has been placed on the skin at the site of patient concern. No underlying acute displaced left-sided rib fracture is evident. IMPRESSION: No evidence for left-sided rib fracture. Electronically Signed   By: Misty Stanley M.D.   On: 10/30/2015 17:57   Ct Renal Stone Study  10/30/2015  CLINICAL DATA:  Left flank pain and nausea. EXAM: CT ABDOMEN AND PELVIS WITHOUT CONTRAST TECHNIQUE: Multidetector CT imaging of the abdomen and pelvis was performed following the standard protocol without IV contrast. COMPARISON:  None. FINDINGS: Lower chest: Lung bases demonstrate patchy opacification which may be due to atelectasis versus pneumonia. Hepatobiliary: 1 cm hypodensity over the right lobe of the liver likely a cyst or hemangioma. Gallbladder and biliary tree are within normal. Pancreas: No mass or inflammatory process identified on this un-enhanced exam. Spleen: Within  normal limits in size. Adrenals/Urinary Tract: Adrenal glands are within normal. Kidneys are normal in size without hydronephrosis or nephrolithiasis. 1.1 cm exophytic hyperdensity over the superior pole of the right kidney compatible with a hemorrhagic cysts. 2.8 cm cyst over the upper pole left kidney with a few smaller left renal cysts. Ureters are within normal. Bladder is unremarkable. Stomach/Bowel: No evidence of obstruction, inflammatory process, or abnormal fluid collections. Appendix is normal. Mild diverticulosis of the colon. Minimal diverticulosis of the terminal ileum. Vascular/Lymphatic: No pathologically enlarged lymph nodes. No evidence of abdominal aortic aneurysm. Minimal calcified plaque over the abdominal aorta. Reproductive: No mass or other significant abnormality. Other: No free fluid, focal inflammatory change or free peritoneal air. Musculoskeletal:  Degenerative changes of the spine and hips. IMPRESSION: No acute findings in the abdomen/pelvis. Patchy opacification over the lung bases which may be due to atelectasis or infection. Bilateral renal cysts with 1.1 cm hemorrhagic cyst over the upper pole right kidney. Diverticulosis of the colon and mild  diverticulosis of the terminal ileum. 1 cm right liver hypodensity likely a cyst or hemangioma. Aortic atherosclerosis. Electronically Signed   By: Marin Olp M.D.   On: 10/30/2015 19:56   I have personally reviewed and evaluated these images and lab results as part of my medical decision-making.   EKG Interpretation   Date/Time:  Saturday October 30 2015 17:01:25 EDT Ventricular Rate:  88 PR Interval:  144 QRS Duration: 66 QT Interval:  342 QTC Calculation: 413 R Axis:   53 Text Interpretation:  Normal sinus rhythm Nonspecific T wave abnormality  Abnormal ECG Confirmed by HAVILAND MD, JULIE ([id number]) on 10/30/2015 6:54:21  PM      MDM   Final diagnoses:  Rib pain on left side  CAP (community acquired pneumonia)  Renal cyst   Liver cyst    Patient with sudden onset left flank pain. Has had some associated shortness of breath, but denies chest pain. Plain films of the ribs and chest were ordered in triage, and are negative. Am more concerned for kidney stone. Will check CT renal stone study. We'll treat pain. However, given that the patient has sats shortness of breath, will check troponin and EKG.  No acute ischemic changes on EKG.  Troponin is negative.  CT shows possible infection in lung bases.  Given cough and rib pain without other source, will treat for CAP.  Patient seen by and discussed with Dr. Gilford Raid, who agrees with the plan.  DC to home.    Montine Circle, PA-C 10/30/15 2024  Isla Pence, MD 10/30/15 [phone]

## 2015-10-30 NOTE — Discharge Instructions (Signed)

## 2015-10-31 ENCOUNTER — Encounter (HOSPITAL_BASED_OUTPATIENT_CLINIC_OR_DEPARTMENT_OTHER): Payer: Self-pay

## 2015-11-01 ENCOUNTER — Ambulatory Visit: Payer: Medicare HMO | Admitting: Internal Medicine

## 2015-11-15 ENCOUNTER — Encounter: Payer: Self-pay | Admitting: Internal Medicine

## 2015-11-15 ENCOUNTER — Other Ambulatory Visit: Payer: Self-pay | Admitting: Internal Medicine

## 2015-11-15 ENCOUNTER — Ambulatory Visit (INDEPENDENT_AMBULATORY_CARE_PROVIDER_SITE_OTHER): Payer: Medicare HMO | Admitting: Internal Medicine

## 2015-11-15 VITALS — BP 110/60 | HR 67 | Wt 171.0 lb

## 2015-11-15 DIAGNOSIS — E0590 Thyrotoxicosis, unspecified without thyrotoxic crisis or storm: Secondary | ICD-10-CM | POA: Diagnosis not present

## 2015-11-15 DIAGNOSIS — E0520 Thyrotoxicosis with toxic multinodular goiter without thyrotoxic crisis or storm: Secondary | ICD-10-CM

## 2015-11-15 LAB — T3, FREE: T3, Free: 3.2 pg/mL (ref 2.3–4.2)

## 2015-11-15 LAB — T4, FREE: Free T4: 0.93 ng/dL (ref 0.60–1.60)

## 2015-11-15 LAB — TSH: TSH: 0.04 u[IU]/mL — ABNORMAL LOW (ref 0.35–4.50)

## 2015-11-15 NOTE — Patient Instructions (Signed)
Please stop at the lab.  Please come back for a follow-up appointment in 6 months.  

## 2015-11-15 NOTE — Progress Notes (Addendum)
Patient ID: Catherine Lowery, female   DOB: [birth year], 70 y.o.   MRN: [id number]   HPI  Catherine Lowery is a 70 y.o.-year-old female, initially referred by her cardiologist, Dr. 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 Last visit 4 mo ago.  She had CAP at the beginning of the mo. She lost 9 lbs since last visit as she did not have an appetite on the ABx. She is now off ABx.Marland Kitchen  She will have hip surgery 12/29/2015.  Reviewed hx: Pt was found to have a low TSH at the time of a recent hospitalization for CP in 12/2014. She r/o for an AMI. She had a follow up visit with Dr Debara Pickett >> a TSH was repeated >> better, but still low.  TSH was minimally low and improving. Free T4 and free T3 were normal and TSI Ab's were not high.    Reviewed all TFTs available in Epic: Lab Results  Component Value Date   TSH 0.33* 06/30/2015   TSH 0.20* 03/02/2015   TSH 0.198* 02/04/2015   TSH 0.045* 01/12/2015   FREET4 0.85 06/30/2015   FREET4 0.78 03/02/2015   FREET4 0.91 02/04/2015    Graves Ab's were not high: Component     Latest Ref Rng 03/02/2015  TSI     <140 % baseline 51   Pt denies feeling nodules in neck, hoarseness, dysphagia/odynophagia, SOB with lying down; she c/o: - no fatigue - + weight loss >> 9 lbs! With her PNA - resolved hot flushes - since menopause - + tremors  - father and uncles have it also; sees neurology (Dr Amalia Hailey [facility]) - no anxiety - no palpitations - no hyperdefecation - no weight loss - no hair loss  Pt has FH of thyroid cancer In sister. No h/o radiation tx to head or neck.  No seaweed or kelp, no recent contrast studies. No steroid use. No herbal supplements. No Biotin use.  ROS: Constitutional: + see HPI Eyes: no blurry vision, no xerophthalmia ENT: no sore throat, no nodules palpated in throat, no dysphagia/odynophagia, no hoarseness Cardiovascular: no CP/palpitations/leg swelling Respiratory: no cough/+ improving  SOB Gastrointestinal: no N/V/D/C Musculoskeletal: no muscle/+ joint aches Skin: no rashes Neurological: no tremors/numbness/tingling/dizziness  I reviewed pt's medications, allergies, PMH, social hx, family hx, and changes were documented in the history of present illness. Otherwise, unchanged from my initial visit note.  Past Medical History  Diagnosis Date  . Hypertension   . Tremors of nervous system   . Hyperlipidemia   . Hyperthyroidism    Past Surgical History  Procedure Laterality Date  . Joint replacement    . Knee surgery    . Bone spur    . Rotator cuff repair Bilateral    Social History   Social History  . Marital Status: Married    Spouse Name: N/A  . Number of Children: 1   Occupational History   retired   Social History Main Topics  . Smoking status: Current Every Day Smoker -- 0.50 packs/day for 20 years  . Smokeless tobacco: Not on file  . Alcohol Use: No  . Drug Use: No   Current Outpatient Prescriptions on File Prior to Visit  Medication Sig Dispense Refill  . aspirin 325 MG tablet Take 325 mg by mouth daily.    . cephALEXin (KEFLEX) 500 MG capsule Take 1 capsule (500 mg total) by mouth 4 (four) times daily. 28 capsule 0  . diclofenac (VOLTAREN) 75 MG EC tablet Take 75 mg  by mouth 2 (two) times daily. Reported on 10/30/2015  2  . hydrochlorothiazide (MICROZIDE) 12.5 MG capsule Take 12.5 mg by mouth every morning.    . primidone (MYSOLINE) 50 MG tablet Take 100 mg by mouth every morning.     . propranolol (INDERAL) 80 MG tablet Take 80 mg by mouth 2 (two) times daily.      . simvastatin (ZOCOR) 20 MG tablet TAKE 1 TABLET EVERY DAY IN THE MORNING 30 tablet 6  . Vitamin D, Ergocalciferol, (DRISDOL) 50000 UNITS CAPS Take 50,000 Units by mouth every 7 (seven) days. sundays    . azithromycin (ZITHROMAX) 250 MG tablet Take 1 tablet (250 mg total) by mouth daily. Take first 2 tablets together, then 1 every day until finished. (Patient not taking: Reported on  11/15/2015) 4 tablet 0  . HYDROcodone-homatropine (HYCODAN) 5-1.5 MG/5ML syrup Take 5 mLs by mouth every 6 (six) hours as needed for cough. (Patient not taking: Reported on 11/15/2015) 120 mL 0   No current facility-administered medications on file prior to visit.   Allergies  Allergen Reactions  . Morphine And Related Itching  . Other Itching and Nausea And Vomiting    Pain medications  . Percocet [Oxycodone-Acetaminophen] Nausea And Vomiting   Family History  Problem Relation Age of Onset  . Hypertension Mother   . Heart disease Mother   . Cancer Father   + see HPI  PE: BP 110/60 mmHg  Pulse 67  Wt 171 lb (77.565 kg)  SpO2 99% Body mass index is 24.9 kg/(m^2). Wt Readings from Last 3 Encounters:  11/15/15 171 lb (77.565 kg)  06/30/15 180 lb (81.647 kg)  03/15/15 181 lb 9 oz (82.356 kg)   Constitutional: normal weight, in NAD Eyes: PERRLA, EOMI, no exophthalmos, no lid lag, no stare ENT: moist mucous membranes, + slight symmetric thyromegaly, no cervical lymphadenopathy Cardiovascular: RRR, No MRG Respiratory: CTA B Gastrointestinal: abdomen soft, NT, ND, BS+ Musculoskeletal: no deformities, strength intact in all 4 Skin: moist, warm, no rashes Neurological: + tremor with outstretched hands, DTR normal in all 4  ASSESSMENT: 1. Subclinical Thyrotoxicosis   2. Goiter  PLAN:  1. Patient with a slightly low TSH, with normal TSIs and free T3/free T4, with improving TSH at last visit. She had hot flushes, now resolved. She had 9 lb wt loss since last visit, but this was during her PNA episode. No hyperdefecation, palpitations, anxiety.   - she does not appear to have exogenous causes for the low TSH.  - We again discussed that possible causes of thyrotoxicosis are:  Graves ds   Thyroiditis toxic multinodular goiter/ toxic adenoma  - If the tests remain abnormal, we may need an uptake and scan to differentiate between the 3 above possible etiologies  - she is on a beta  blocker - per cardiology  - I advised her to join my chart to communicate easier - refuses - RTC in 4 months, but likely sooner for repeat labs  2. Goiter - may need a thyroid U/S after the results normalize  Office Visit on 11/15/2015  Component Date Value Ref Range Status  . T3, Free 11/15/2015 3.2  2.3 - 4.2 pg/mL Final  . Free T4 11/15/2015 0.93  0.60 - 1.60 ng/dL Final  . TSH 11/15/2015 0.04* 0.35 - 4.50 uIU/mL Final   TSH is lower >> I suggested a thyroid uptake and scan >> patient has this scheduled on July 31 and August 1.  CLINICAL DATA:  Thyrotoxicosis without thyroid  storm.  EXAM: THYROID SCAN AND UPTAKE - 24 HOURS  TECHNIQUE: Following the per oral administration of I-131 sodium iodide, the patient returned at 24 hours and uptake measurements were acquired with the uptake probe centered on the neck. Thyroid imaging was performed following the intravenous administration of the Tc-85m Pertechnetate.  RADIOPHARMACEUTICALS:  10.0 MicroCuries I-131 sodium iodide orally and 9.7 mCi Technetium-23m pertechnetate IV  COMPARISON:  None  FINDINGS: The 24 hour radioactive iodine uptake is equal to 21.7%.  Heterogeneous tracer activity is identified within both lobes of the thyroid gland. Relative areas of decreased uptake localizing to the upper pole of the left lobe, mid right lobe and upper pole of right lobe is noted.  IMPRESSION: 1. 24 hour radioactive iodine uptake is within normal limits at 21.7%. 2. Multi nodular thyroid gland.   Electronically Signed   By: Kerby Moors M.D.    On: 11/30/2015 17:02      Will go ahead and obtain a thyroid U/S to investigate the cold nodules. Will likely need RAI tx afterwards.  Addendum: US Soft Tissue Head/Neck   Details   Reading Physician Reading Date Result Priority  Marybelle Killings, MD 12/09/2015   Narrative    CLINICAL DATA: Family history of thyroid cancer. Thyrotoxicosis without storm. Heterogeneous  uptake throughout both lobes noted by nuclear medicine thyroid imaging.  EXAM: THYROID ULTRASOUND  TECHNIQUE: Ultrasound examination of the thyroid gland and adjacent soft tissues was performed.  COMPARISON: None.  FINDINGS: Right thyroid lobe  Measurements: 5.9 x 2.0 x 2.4 cm. Multiple nodules are scattered throughout the right lobe. 1.3 x 0.7 x 0.9 cm mid lobe solid nodule. 1.7 x 1.3 x 0.8 cm upper pole solid nodule (isoechoic). 1.9 x 0.9 x 1.2 cm lower pole nodule (spongiform appearance). Other scattered smaller nodules are noted.  Left thyroid lobe  Measurements: 5.6 x 1.5 x 2.0 cm. Multiple solid nodules are scattered throughout the left lobe. 1.0 x 1.1 x 1.4 cm mid lobe nodule. 2.1 x 0.9 x 1.2 cm lower pole nodule (hyperechoic). 1.4 x 1.0 x 1.1 cm upper pole nodule (isoechoic). Other smaller nodules are noted.  Isthmus  Thickness: 0.4 cm. Small subcentimeter nodules are present.  Lymphadenopathy  None visualized.  IMPRESSION: Multiple bilateral nodules are seen throughout the thyroid gland. The largest on left measures 2.1 cm and the largest on the right measures 1.9 cm. Findings meet consensus criteria for biopsy. Ultrasound-guided fine needle aspiration should be considered, as per the consensus statement: Management of Thyroid Nodules Detected at [place]: Society of Radiologists in [place]. Radiology 2005; [id number].   Electronically Signed By: Marybelle Killings M.D. On: 12/09/2015 16:31      Please note that my comments appear in italics above.  Multiple thyroid nodules; I highlighted the nodules that appeared cold on the thyroid scan. The dominant nodules appeared as overactive on the scan. These are smaller and could be inflammatory. I would suggest to proceed with the radioactive iodine treatment for now. We may need another ultrasound in one year after she becomes euthyroid.  I would like to see the patient or at least  check labs approximately 1 month after her RAI treatment.

## 2015-11-16 ENCOUNTER — Telehealth: Payer: Self-pay

## 2015-11-16 NOTE — Telephone Encounter (Signed)
Called and notified patient of lab results and future plans. Patient stated she had an appointment for the scan and uptake. July 31st, and August 1st both at 1:00 pm. Patient had no other questions or concerns.

## 2015-11-29 ENCOUNTER — Encounter (HOSPITAL_COMMUNITY)
Admission: RE | Admit: 2015-11-29 | Discharge: 2015-11-29 | Disposition: A | Discharge status: Home / Self Care | Payer: Medicare HMO | Source: Ambulatory Visit | Attending: Internal Medicine | Admitting: Internal Medicine

## 2015-11-29 DIAGNOSIS — E0590 Thyrotoxicosis, unspecified without thyrotoxic crisis or storm: Secondary | ICD-10-CM | POA: Insufficient documentation

## 2015-11-30 ENCOUNTER — Encounter (HOSPITAL_COMMUNITY)
Admission: RE | Admit: 2015-11-30 | Discharge: 2015-11-30 | Disposition: A | Discharge status: Home / Self Care | Payer: Medicare HMO | Source: Ambulatory Visit | Attending: Internal Medicine | Admitting: Internal Medicine

## 2015-11-30 DIAGNOSIS — E0590 Thyrotoxicosis, unspecified without thyrotoxic crisis or storm: Secondary | ICD-10-CM | POA: Diagnosis present

## 2015-11-30 MED ORDER — SODIUM PERTECHNETATE TC 99M INJECTION
9.7000 | Freq: Once | INTRAVENOUS | Status: AC | PRN
  Administered 2015-11-30: 9.7 via INTRAVENOUS

## 2015-11-30 NOTE — Addendum Note (Signed)
Addended by: [name] on: 11/30/2015 05:58 PM   Modules accepted: Orders

## 2015-12-01 ENCOUNTER — Telehealth: Payer: Self-pay

## 2015-12-01 NOTE — Telephone Encounter (Signed)
Called and spoke with patient about uptake results, and advised on ultrasound that was ordered, advised her they would call to make appointment.

## 2015-12-09 ENCOUNTER — Ambulatory Visit
Admission: RE | Admit: 2015-12-09 | Discharge: 2015-12-09 | Disposition: A | Discharge status: Home / Self Care | Payer: Medicare HMO | Source: Ambulatory Visit | Attending: Internal Medicine | Admitting: Internal Medicine

## 2015-12-10 ENCOUNTER — Other Ambulatory Visit: Payer: Self-pay | Admitting: Physician Assistant

## 2015-12-10 NOTE — H&P (Signed)
TOTAL HIP ADMISSION H&P  Patient is admitted for left total hip arthroplasty.  Subjective:  Chief Complaint: left hip pain  HPI: Catherine Lowery, 70 y.o. female, has a history of pain and functional disability in the left hip(s) due to arthritis and patient has failed non-surgical conservative treatments for greater than 12 weeks to include NSAID's and/or analgesics, corticosteriod injections and activity modification.  Onset of symptoms was abrupt starting 1 years ago with rapidlly worsening course since that time.The patient noted no past surgery on the left hip(s).  Patient currently rates pain in the left hip at 1 out of 10 with activity. Patient has night pain, worsening of pain with activity and weight bearing, trendelenberg gait, pain that interfers with activities of daily living and pain with passive range of motion. Patient has evidence of subchondral sclerosis and joint space narrowing by imaging studies. This condition presents safety issues increasing the risk of falls. This patient has had .  There is no current active infection.  Patient Active Problem List   Diagnosis Date Noted  . Encounter for screening colonoscopy 02/18/2015  . 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 Hyperlipidemia 01/12/2015  . Benign essential HTN 01/12/2015  . Tobacco abuse 01/12/2015  . Atypical chest pain 01/11/2015   Past Medical History:  Diagnosis Date  . Hyperlipidemia   . Hypertension   . Hyperthyroidism   . Tremors of nervous system     Past Surgical History:  Procedure Laterality Date  . BOne Spur    . JOINT REPLACEMENT    . KNEE SURGERY    . ROTATOR CUFF REPAIR Bilateral      (Not in a hospital admission) Allergies  Allergen Reactions  . Morphine And Related Itching  . Other Itching and Nausea And Vomiting    Pain medications  . Percocet [Oxycodone-Acetaminophen] Nausea And Vomiting    Social History  Substance Use Topics  . Smoking status: Current Every Day Smoker   Packs/day: 0.50    Years: 20.00  . Smokeless tobacco: Not on file  . Alcohol use No    Family History  Problem Relation Age of Onset  . Hypertension Mother   . Heart disease Mother   . Cancer Father      Review of Systems  Constitutional: Negative.   HENT: Negative.   Eyes: Negative.   Respiratory: Negative.   Cardiovascular: Negative.   Gastrointestinal: Negative.   Genitourinary: Negative.   Musculoskeletal: Positive for back pain and joint pain.  Skin: Negative.   Neurological: Positive for tremors.  Endo/Heme/Allergies: Negative.   Psychiatric/Behavioral: Negative.     Objective:  Physical Exam  Constitutional: She is oriented to person, place, and time. She appears well-developed and well-nourished.  HENT:  Head: Normocephalic and atraumatic.  Eyes: EOM are normal. Pupils are equal, round, and reactive to light.  Neck: Normal range of motion. Neck supple.  Cardiovascular: Normal rate and regular rhythm.   Respiratory: Effort normal and breath sounds normal.  GI: Soft. Bowel sounds are normal.  Musculoskeletal:  Positive log roll left greater than right but fairly good motion.  There is a little bit of pain with straight leg raise both sides. There is soreness in her lower back but there is not a predominant radiculopathy from what I am seeing.   Neurological: She is alert and oriented to person, place, and time.  Skin: Skin is warm and dry.  Psychiatric: She has a normal mood and affect. Her behavior is normal. Judgment and thought content normal.  Vital signs in last 24 hours: @VSRANGES @  Labs:   Estimated body mass index is 24.89 kg/m as calculated from the following:   Height as of 03/15/15: 5' 9.5" (1.765 m).   Weight as of 11/15/15: 77.6 kg (171 lb).   Imaging Review Plain radiographs demonstrate severe degenerative joint disease of the left hip(s). The bone quality appears to be fair for age and reported activity level.  Assessment/Plan:  End  stage arthritis, left hip(s)  The patient history, physical examination, clinical judgement of the provider and imaging studies are consistent with end stage degenerative joint disease of the left hip(s) and total hip arthroplasty is deemed medically necessary. The treatment options including medical management, injection therapy, arthroscopy and arthroplasty were discussed at length. The risks and benefits of total hip arthroplasty were presented and reviewed. The risks due to aseptic loosening, infection, stiffness, dislocation/subluxation,  thromboembolic complications and other imponderables were discussed.  The patient acknowledged the explanation, agreed to proceed with the plan and consent was signed. Patient is being admitted for inpatient treatment for surgery, pain control, PT, OT, prophylactic antibiotics, VTE prophylaxis, progressive ambulation and ADL's and discharge planning.The patient is planning to be discharged to skilled nursing facility

## 2015-12-10 NOTE — Addendum Note (Signed)
Addended by: [name] on: 12/10/2015 02:48 PM   Modules accepted: Orders

## 2015-12-14 NOTE — Addendum Note (Signed)
Addended by: [name] on: 12/14/2015 06:24 PM   Modules accepted: Orders

## 2015-12-15 ENCOUNTER — Telehealth: Payer: Self-pay

## 2015-12-15 NOTE — Telephone Encounter (Signed)
OK, noted

## 2015-12-15 NOTE — Telephone Encounter (Signed)
Called and spoke with patient about about ultrasound results, advised patient to give [place] a call back once they get that treatment scheduled in 5 weeks, patient notified me she is having hip replacement surgery on the 12/29/15, and wanted to notify you.

## 2015-12-16 NOTE — Pre-Procedure Instructions (Signed)
    Catherine Lowery  12/16/2015     Your procedure is scheduled on : Wednesday December 29, 2015 at 8:30 AM.  Report to [facility] Admitting at 6:30 AM.  Call this number if you have problems the morning of surgery: [phone]    Remember:  Do not eat food or drink liquids after midnight.  Take these medicines the morning of surgery with A SIP OF WATER : Propranolol (Inderal), Primidone (Mysoline)   Stop taking any vitamins, herbal medications/supplements, NSAIDs, Ibuprofen, Advil, Motrin, Aleve, Dicofenac/Voltaren, etc on Wednesday August 23rd   Do not wear jewelry, make-up or nail polish.  Do not wear lotions, powders, or perfumes.    Do not shave 48 hours prior to surgery.   Do not bring valuables to the hospital.  [facility] is not responsible for any belongings or valuables.  Contacts, dentures or bridgework may not be worn into surgery.  Leave your suitcase in the car.  After surgery it may be brought to your room.  For patients admitted to the hospital, discharge time will be determined by your treatment team.  Patients discharged the day of surgery will not be allowed to drive home.   Name and phone number of your driver:    Special instructions:  Shower using CHG soap the night before and the morning of your surgery  Please read over the following fact sheets that you were given. Pain Booklet, MRSA Information and Surgical Site Infection Prevention

## 2015-12-17 ENCOUNTER — Encounter (HOSPITAL_COMMUNITY)
Admission: RE | Admit: 2015-12-17 | Discharge: 2015-12-17 | Disposition: A | Discharge status: Home / Self Care | Payer: Medicare HMO | Source: Ambulatory Visit | Attending: Orthopedic Surgery | Admitting: Orthopedic Surgery

## 2015-12-17 ENCOUNTER — Encounter (HOSPITAL_COMMUNITY): Payer: Self-pay

## 2015-12-17 DIAGNOSIS — Z96653 Presence of artificial knee joint, bilateral: Secondary | ICD-10-CM | POA: Insufficient documentation

## 2015-12-17 DIAGNOSIS — I129 Hypertensive chronic kidney disease with stage 1 through stage 4 chronic kidney disease, or unspecified chronic kidney disease: Secondary | ICD-10-CM | POA: Diagnosis not present

## 2015-12-17 DIAGNOSIS — E0590 Thyrotoxicosis, unspecified without thyrotoxic crisis or storm: Secondary | ICD-10-CM | POA: Insufficient documentation

## 2015-12-17 DIAGNOSIS — N183 Chronic kidney disease, stage 3 (moderate): Secondary | ICD-10-CM | POA: Diagnosis not present

## 2015-12-17 DIAGNOSIS — Z7982 Long term (current) use of aspirin: Secondary | ICD-10-CM | POA: Insufficient documentation

## 2015-12-17 DIAGNOSIS — Z0183 Encounter for blood typing: Secondary | ICD-10-CM | POA: Diagnosis not present

## 2015-12-17 DIAGNOSIS — G250 Essential tremor: Secondary | ICD-10-CM | POA: Diagnosis not present

## 2015-12-17 DIAGNOSIS — Z01818 Encounter for other preprocedural examination: Secondary | ICD-10-CM | POA: Diagnosis not present

## 2015-12-17 DIAGNOSIS — M1612 Unilateral primary osteoarthritis, left hip: Secondary | ICD-10-CM | POA: Diagnosis not present

## 2015-12-17 DIAGNOSIS — E785 Hyperlipidemia, unspecified: Secondary | ICD-10-CM | POA: Insufficient documentation

## 2015-12-17 DIAGNOSIS — Z01812 Encounter for preprocedural laboratory examination: Secondary | ICD-10-CM | POA: Diagnosis not present

## 2015-12-17 DIAGNOSIS — Z79899 Other long term (current) drug therapy: Secondary | ICD-10-CM | POA: Insufficient documentation

## 2015-12-17 HISTORY — DX: Other specified postprocedural states: Z98.890

## 2015-12-17 HISTORY — DX: Pneumonia, unspecified organism: J18.9

## 2015-12-17 HISTORY — DX: Nausea with vomiting, unspecified: R11.2

## 2015-12-17 HISTORY — DX: Reserved for inherently not codable concepts without codable children: IMO0001

## 2015-12-17 LAB — BASIC METABOLIC PANEL
ANION GAP: 7 (ref 5–15)
BUN: 30 mg/dL — AB (ref 6–20)
CHLORIDE: 107 mmol/L (ref 101–111)
CO2: 25 mmol/L (ref 22–32)
Calcium: 9.5 mg/dL (ref 8.9–10.3)
Creatinine, Ser: 1.5 mg/dL — ABNORMAL HIGH (ref 0.44–1.00)
GFR calc non Af Amer: 34 mL/min — ABNORMAL LOW (ref >60)
GFR, EST AFRICAN AMERICAN: 39 mL/min — AB (ref >60)
Glucose, Bld: 89 mg/dL (ref 65–99)
POTASSIUM: 4.2 mmol/L (ref 3.5–5.1)
SODIUM: 139 mmol/L (ref 135–145)

## 2015-12-17 LAB — SURGICAL PCR SCREEN
MRSA, PCR: NEGATIVE
STAPHYLOCOCCUS AUREUS: NEGATIVE

## 2015-12-17 LAB — CBC
HCT: 34.6 % — ABNORMAL LOW (ref 36.0–46.0)
HEMOGLOBIN: 11.3 g/dL — AB (ref 12.0–15.0)
MCH: 22.7 pg — AB (ref 26.0–34.0)
MCHC: 32.7 g/dL (ref 30.0–36.0)
MCV: 69.6 fL — ABNORMAL LOW (ref 78.0–100.0)
Platelets: 220 10*3/uL (ref 150–400)
RBC: 4.97 MIL/uL (ref 3.87–5.11)
RDW: 15.8 % — ABNORMAL HIGH (ref 11.5–15.5)
WBC: 5.5 10*3/uL (ref 4.0–10.5)

## 2015-12-17 NOTE — Pre-Procedure Instructions (Signed)
BETTA MCNIECE  12/17/2015      [id number] [address], [place] [phone] [place] [address] Phone: [phone] Fax: [phone]    Your procedure is scheduled on Mon, Aug 28 @ 12:30 PM  Report to [facility] Admitting at 10:30 AM  Call this number if you have problems the morning of surgery:  [phone]   Remember:  Do not eat food or drink liquids after midnight.  Take these medicines the morning of surgery with A SIP OF WATER Propranolol(Inderal)              Stop taking your Aspirin along with any Vitamins or Herbal Medications a week prior to surgery. No Goody's,BC's,Aleve,Advil,Motrin,Ibuprofen,or Fish Oil.    Do not wear jewelry, make-up or nail polish.  Do not wear lotions, powders, or perfumes.    Do not shave 48 hours prior to surgery.    Do not bring valuables to the hospital.  [facility] is not responsible for any belongings or valuables.  Contacts, dentures or bridgework may not be worn into surgery.  Leave your suitcase in the car.  After surgery it may be brought to your room.  For patients admitted to the hospital, discharge time will be determined by your treatment team.  Patients discharged the day of surgery will not be allowed to drive home.    [facility]  Before surgery, you can play an important role.  Because skin is not sterile, your skin needs to be as free of germs as possible.  You can reduce the number of germs on you skin by washing with CHG (chlorahexidine gluconate) soap before surgery.  CHG is an antiseptic cleaner which kills germs and bonds with the skin to continue killing germs even after washing.  Please DO NOT use if you have an allergy to CHG or antibacterial soaps.  If your skin becomes reddened/irritated stop using the CHG and inform your nurse when you arrive at Short Stay.  Do not shave (including legs and  underarms) for at least 48 hours prior to the first CHG shower.  You may shave your face.  Please follow these instructions carefully:   1.  Shower with CHG Soap the night before surgery and the                                morning of Surgery.  2.  If you choose to wash your hair, wash your hair first as usual with your       normal shampoo.  3.  After you shampoo, rinse your hair and body thoroughly to remove the                      Shampoo.  4.  Use CHG as you would any other liquid soap.  You can apply chg directly       to the skin and wash gently with scrungie or a clean washcloth.  5.  Apply the CHG Soap to your body ONLY FROM THE NECK DOWN.        Do not use on open wounds or open sores.  Avoid contact with your eyes,       ears, mouth and genitals (private parts).  Wash genitals (private parts)       with your normal  soap.  6.  Wash thoroughly, paying special attention to the area where your surgery        will be performed.  7.  Thoroughly rinse your body with warm water from the neck down.  8.  DO NOT shower/wash with your normal soap after using and rinsing off       the CHG Soap.  9.  Pat yourself dry with a clean towel.            10.  Wear clean pajamas.            11.  Place clean sheets on your bed the night of your first shower and do not        sleep with pets.  Day of Surgery  Do not apply any lotions/deoderants the morning of surgery.  Please wear clean clothes to the hospital/surgery center.    Please read over the following fact sheets that you were given. Pain Booklet, Coughing and Deep Breathing and MRSA Information

## 2015-12-17 NOTE — Progress Notes (Signed)
PCP is Blair Heys PA-C  Cardiologist is Dr. Debara Pickett  Neurologist is Dr. Amalia Hailey in [facility]  Patient sees Dr. Benjiman Core for hyperthyroidism  Patient is scheduled to see Dr. Florene Glen on 01/26/16 because of creatinine level per patients PCP.  Patient denied having any acute cardiac or pulmonary issues, but informed Nurse that she had pneumonia in July 2017.  A-11 sent to [employer] requesting some small long knee high TED hose.

## 2015-12-21 NOTE — Progress Notes (Addendum)
Anesthesia Chart Review: Patient is a 70 year old female scheduled for left direct anterior THR on 12/29/15 by Dr. Kathryne Lowery.  History includes smoking, essential tremor,HLD, HTN, exertional dyspnea, hyperthyroidism (subclinical thyrotoxicosis), bilateral TKA. She was treated with Zpack and Keflex for PNA in the ED based on renal CTA results 10/30/15  - PCP is Catherine Evangelist, PA-C with [facility] who saw patient for pre-operative evaluation on 11/23/15 (see Care Everywhere). For her plan she wrote: 1. Preoperative workup as follows chest x-ray, ECG, hemoglobin, hematocrit, electrolytes, creatinine, glucose, liver function studies. 2. Change in medication regimen before surgery: none, continue medication regimen including morning of surgery, with sip of water. 3. Given clearance for surgical procedure.  4. Consulted nephrology for any recommendations prior to surgery.   - Endocrinologist is Dr. Cruzita Lowery, last visit 11/15/15. First diagnosed with hyperthyroidism in 12/2014. She had 9 lb weight loss 10/2015 in the setting of PNA. Hot flushes had resolved. No hyperdefecation, palpitations, anxiety. TSH was a little lower, so thyroid uptake and scan ordered. Multi nodular thyroid gland noted. Thyroid U/S ordered to investigate cold nodules. She recommended radioactive iodine treatment with follow-up labs approximately one month after RAI treatment. According to 12/15/15 telephone encounter by Catherine Beaver, LPN patient to call once treatments scheduled in 5 weeks. At that time, patient did notify Dr. Cruzita Lowery of plans for [place] on 12/29/15.   - Nephrologist is Dr. Florene Lowery, last visit 03/24/15 for follow-up CKD stage III. Next visit cheduled for 01/26/16. [facility] faxed Dr. Florene Lowery a copy of her 09/30/15 lab results.   - Cardiologist is Dr. Debara Lowery, last visit 03/15/15. She was first seen during 12/2014 admission for chest pain with negative Myoview but reduced EF of 47%. EF was normal by echo. She was also  diagnosed with hyperthyroidism at that time. She was referred to endocrinology. No further cardiac work-up recommended at that time.  - Neurologist is Dr. Ernest Lowery with [facility].   Meds include ASA 325mg , HCTZ, primidone, propranolol ER, Zocor.  BP (!) 141/62   Pulse 61   Temp 36.7 C   Resp 20   Ht 5\' 9"  (1.753 m)   Wt 176 lb 1.6 oz (79.9 kg)   LMP 01/24/2000   SpO2 100%   BMI 26.01 kg/m   10/30/15 EKG: NSR, non-specific T wave abnormality. 11/23/15 Results Narrative EKG from [facility] showed "sinus bradycardia."  02/15/15 Echo: Study Conclusions - Left ventricle: The cavity size was normal. There was mild concentric hypertrophy. Systolic function was normal. The estimated ejection fraction was in the range of 55% to 60%. Wall motion was normal; there were no regional wall motion abnormalities. Doppler parameters are consistent with abnormal left ventricular relaxation (grade 1 diastolic dysfunction). - Left atrium: The atrium was mildly dilated. - Right ventricle: The cavity size was normal. Wall thickness was normal. Systolic function was normal. - Tricuspid valve: There was no regurgitation. - Pulmonic valve: Transvalvular velocity was within the normal range. There was no evidence for stenosis. - Inferior vena cava: The vessel was normal in size. The respirophasic diameter changes were in the normal range (>= 50%), consistent with normal central venous pressure.  01/12/15 Nuclear stress test: IMPRESSION: 1. No reversible ischemia or infarction. 2. Normal left ventricular wall motion. 3. Left ventricular ejection fraction 47% 4. Low-risk stress test findings*.  12/09/15 Thyroid U/S: IMPRESSION: Multiple bilateral nodules are seen throughout the thyroid gland. The largest on left measures 2.1 cm and the largest on the right measures  1.9 cm. Findings meet consensus criteria for biopsy. Ultrasound-guided fine needle aspiration should be considered, as per the consensus  statement: Management of Thyroid Nodules Detected at [place]: Society of Radiologists in [place]. Radiology 2005; [id number].  01/11/15 CXR: FINDINGS: Cardiomediastinal silhouette is stable. No acute infiltrate or pleural effusion. No pulmonary edema. Mild degenerative changes thoracic spine. IMPRESSION: No active cardiopulmonary disease.  10/30/15 CT Renal Stone Study: IMPRESSION: - No acute findings in the abdomen/pelvis. - Patchy opacification over the lung bases which may be due to atelectasis or infection. - Bilateral renal cysts with 1.1 cm hemorrhagic cyst over the upper pole right kidney. - Diverticulosis of the colon and mild diverticulosis of the terminal ileum. - 1 cm right liver hypodensity likely a cyst or hemangioma. - Aortic atherosclerosis.  Preoperative labs noted. H/H 11.3/34.6. BUN 30, Cr 1.50 (up from BUN/Cr 19/1.17 on 10/30/15, 33/1.46 on 09/30/15 in [place] and 21/1.09 01/11/15), Currently last TSH 0.04 (0.35-4.50) with normal free T4 0.93 and free triiodothyronine of 3.2.   Patient with medical clearance. She is being actively followed by nephrology. Patient will undergo radioactive iodine treatments in the future, but currently subclinical hyperthyroidism. Her endocrinologist is also aware of surgery plans. Discussed above and labs with anesthesiologist Dr. Marcie Lowery. If no acute changes or concerning hyperthyroid symptoms then it is anticipated that she can proceed as planned. Surgeon can follow renal function post-operatively and consult nephrology if felt indicated--otherwise she has scheduled follow-up next month.    [name] Short Stay Center/Anesthesiology Phone [phone] 12/21/2015 4:25 PM

## 2015-12-28 MED ORDER — TRANEXAMIC ACID 1000 MG/10ML IV SOLN
1000.0000 mg | INTRAVENOUS | Status: AC
  Administered 2015-12-29: 1000 mg via INTRAVENOUS
  Filled 2015-12-28: qty 10

## 2015-12-29 ENCOUNTER — Inpatient Hospital Stay (HOSPITAL_COMMUNITY): Payer: Medicare HMO | Admitting: Vascular Surgery

## 2015-12-29 ENCOUNTER — Inpatient Hospital Stay (HOSPITAL_COMMUNITY)
Admission: RE | Admit: 2015-12-29 | Discharge: 2015-12-31 | DRG: 470 | Disposition: A | Discharge status: Skilled Nursing Facility | Payer: Medicare HMO | Source: Ambulatory Visit | Attending: Orthopedic Surgery | Admitting: Orthopedic Surgery

## 2015-12-29 ENCOUNTER — Inpatient Hospital Stay (HOSPITAL_COMMUNITY): Payer: Medicare HMO

## 2015-12-29 ENCOUNTER — Inpatient Hospital Stay (HOSPITAL_COMMUNITY): Payer: Medicare HMO | Admitting: Anesthesiology

## 2015-12-29 ENCOUNTER — Encounter (HOSPITAL_COMMUNITY): Payer: Self-pay | Admitting: Anesthesiology

## 2015-12-29 ENCOUNTER — Encounter (HOSPITAL_COMMUNITY)
Admission: RE | Disposition: A | Discharge status: Skilled Nursing Facility | Payer: Self-pay | Source: Ambulatory Visit | Attending: Orthopedic Surgery

## 2015-12-29 DIAGNOSIS — Z419 Encounter for procedure for purposes other than remedying health state, unspecified: Secondary | ICD-10-CM

## 2015-12-29 DIAGNOSIS — Z885 Allergy status to narcotic agent status: Secondary | ICD-10-CM | POA: Diagnosis not present

## 2015-12-29 DIAGNOSIS — M1612 Unilateral primary osteoarthritis, left hip: Secondary | ICD-10-CM | POA: Diagnosis present

## 2015-12-29 DIAGNOSIS — R251 Tremor, unspecified: Secondary | ICD-10-CM | POA: Diagnosis present

## 2015-12-29 DIAGNOSIS — D62 Acute posthemorrhagic anemia: Secondary | ICD-10-CM | POA: Diagnosis not present

## 2015-12-29 DIAGNOSIS — E785 Hyperlipidemia, unspecified: Secondary | ICD-10-CM | POA: Diagnosis present

## 2015-12-29 DIAGNOSIS — E0520 Thyrotoxicosis with toxic multinodular goiter without thyrotoxic crisis or storm: Secondary | ICD-10-CM | POA: Diagnosis present

## 2015-12-29 DIAGNOSIS — F17210 Nicotine dependence, cigarettes, uncomplicated: Secondary | ICD-10-CM | POA: Diagnosis present

## 2015-12-29 DIAGNOSIS — I10 Essential (primary) hypertension: Secondary | ICD-10-CM | POA: Diagnosis present

## 2015-12-29 HISTORY — DX: Unspecified osteoarthritis, unspecified site: M19.90

## 2015-12-29 HISTORY — PX: TOTAL HIP ARTHROPLASTY: SHX124

## 2015-12-29 SURGERY — ARTHROPLASTY, HIP, TOTAL, ANTERIOR APPROACH
Anesthesia: General | Laterality: Left

## 2015-12-29 MED ORDER — ASPIRIN EC 325 MG PO TBEC
325.0000 mg | DELAYED_RELEASE_TABLET | Freq: Every day | ORAL | Status: DC
  Administered 2015-12-30 – 2015-12-31 (×2): 325 mg via ORAL
  Filled 2015-12-29 (×2): qty 1

## 2015-12-29 MED ORDER — LACTATED RINGERS IV SOLN: INTRAVENOUS | Status: DC

## 2015-12-29 MED ORDER — PROMETHAZINE HCL 25 MG/ML IJ SOLN: 6.2500 mg | INTRAMUSCULAR | Status: DC | PRN

## 2015-12-29 MED ORDER — ROCURONIUM BROMIDE 100 MG/10ML IV SOLN
INTRAVENOUS | Status: DC | PRN
  Administered 2015-12-29: 10 mg via INTRAVENOUS
  Administered 2015-12-29: 50 mg via INTRAVENOUS
  Administered 2015-12-29: 10 mg via INTRAVENOUS

## 2015-12-29 MED ORDER — CEFAZOLIN SODIUM-DEXTROSE 2-4 GM/100ML-% IV SOLN
2.0000 g | INTRAVENOUS | Status: AC
  Administered 2015-12-29: 2 g via INTRAVENOUS
  Filled 2015-12-29: qty 100

## 2015-12-29 MED ORDER — ZOLPIDEM TARTRATE 5 MG PO TABS: 5.0000 mg | ORAL_TABLET | Freq: Every evening | ORAL | Status: DC | PRN

## 2015-12-29 MED ORDER — LIDOCAINE HCL (CARDIAC) 20 MG/ML IV SOLN
INTRAVENOUS | Status: DC | PRN
  Administered 2015-12-29: 80 mg via INTRAVENOUS

## 2015-12-29 MED ORDER — DOCUSATE SODIUM 100 MG PO CAPS
100.0000 mg | ORAL_CAPSULE | Freq: Two times a day (BID) | ORAL | Status: DC
  Administered 2015-12-29 – 2015-12-31 (×5): 100 mg via ORAL
  Filled 2015-12-29 (×5): qty 1

## 2015-12-29 MED ORDER — ASPIRIN EC 325 MG PO TBEC: 325.0000 mg | DELAYED_RELEASE_TABLET | Freq: Every day | ORAL | 0 refills | Status: DC

## 2015-12-29 MED ORDER — HYDROMORPHONE HCL 1 MG/ML IJ SOLN: 0.5000 mg | INTRAMUSCULAR | Status: DC | PRN

## 2015-12-29 MED ORDER — HYDROMORPHONE HCL 1 MG/ML IJ SOLN
INTRAMUSCULAR | Status: AC
  Filled 2015-12-29: qty 1

## 2015-12-29 MED ORDER — BUPIVACAINE LIPOSOME 1.3 % IJ SUSP
20.0000 mL | INTRAMUSCULAR | Status: DC
  Filled 2015-12-29: qty 20

## 2015-12-29 MED ORDER — CELECOXIB 200 MG PO CAPS
200.0000 mg | ORAL_CAPSULE | Freq: Two times a day (BID) | ORAL | Status: DC
  Administered 2015-12-29 – 2015-12-31 (×5): 200 mg via ORAL
  Filled 2015-12-29 (×5): qty 1

## 2015-12-29 MED ORDER — BUPIVACAINE HCL (PF) 0.5 % IJ SOLN
INTRAMUSCULAR | Status: DC | PRN
  Administered 2015-12-29: 10 mL

## 2015-12-29 MED ORDER — BUPIVACAINE LIPOSOME 1.3 % IJ SUSP
INTRAMUSCULAR | Status: DC | PRN
  Administered 2015-12-29: 60 mL

## 2015-12-29 MED ORDER — FENTANYL CITRATE (PF) 100 MCG/2ML IJ SOLN
INTRAMUSCULAR | Status: DC | PRN
  Administered 2015-12-29 (×4): 50 ug via INTRAVENOUS

## 2015-12-29 MED ORDER — CHLORHEXIDINE GLUCONATE 4 % EX LIQD: 60.0000 mL | Freq: Once | CUTANEOUS | Status: DC

## 2015-12-29 MED ORDER — ONDANSETRON HCL 4 MG PO TABS: 4.0000 mg | ORAL_TABLET | Freq: Three times a day (TID) | ORAL | 0 refills | Status: DC | PRN

## 2015-12-29 MED ORDER — FENTANYL CITRATE (PF) 100 MCG/2ML IJ SOLN
INTRAMUSCULAR | Status: AC
  Filled 2015-12-29: qty 2

## 2015-12-29 MED ORDER — ACETAMINOPHEN 650 MG RE SUPP: 650.0000 mg | Freq: Four times a day (QID) | RECTAL | Status: DC | PRN

## 2015-12-29 MED ORDER — 0.9 % SODIUM CHLORIDE (POUR BTL) OPTIME
TOPICAL | Status: DC | PRN
  Administered 2015-12-29: 1000 mL

## 2015-12-29 MED ORDER — OXYCODONE-ACETAMINOPHEN 5-325 MG PO TABS: 1.0000 | ORAL_TABLET | ORAL | 0 refills | Status: DC | PRN

## 2015-12-29 MED ORDER — MIDAZOLAM HCL 5 MG/5ML IJ SOLN
INTRAMUSCULAR | Status: DC | PRN
  Administered 2015-12-29: 2 mg via INTRAVENOUS

## 2015-12-29 MED ORDER — SUGAMMADEX SODIUM 200 MG/2ML IV SOLN
INTRAVENOUS | Status: DC | PRN
  Administered 2015-12-29: 160 mg via INTRAVENOUS

## 2015-12-29 MED ORDER — PHENOL 1.4 % MT LIQD: 1.0000 | OROMUCOSAL | Status: DC | PRN

## 2015-12-29 MED ORDER — HYDROMORPHONE HCL 1 MG/ML IJ SOLN
0.2500 mg | INTRAMUSCULAR | Status: DC | PRN
  Administered 2015-12-29 (×3): 0.5 mg via INTRAVENOUS

## 2015-12-29 MED ORDER — HYDROMORPHONE HCL 1 MG/ML IJ SOLN
INTRAMUSCULAR | Status: AC
  Administered 2015-12-29: 0.5 mg via INTRAVENOUS
  Filled 2015-12-29: qty 1

## 2015-12-29 MED ORDER — POTASSIUM CHLORIDE IN NACL 20-0.9 MEQ/L-% IV SOLN
INTRAVENOUS | Status: DC
  Administered 2015-12-29: 16:00:00 via INTRAVENOUS
  Filled 2015-12-29: qty 1000

## 2015-12-29 MED ORDER — PROPOFOL 10 MG/ML IV BOLUS
INTRAVENOUS | Status: AC
  Filled 2015-12-29: qty 40

## 2015-12-29 MED ORDER — METOCLOPRAMIDE HCL 5 MG PO TABS: 5.0000 mg | ORAL_TABLET | Freq: Three times a day (TID) | ORAL | Status: DC | PRN

## 2015-12-29 MED ORDER — MAGNESIUM CITRATE PO SOLN: 1.0000 | Freq: Once | ORAL | Status: DC | PRN

## 2015-12-29 MED ORDER — ONDANSETRON HCL 4 MG/2ML IJ SOLN: 4.0000 mg | Freq: Four times a day (QID) | INTRAMUSCULAR | Status: DC | PRN

## 2015-12-29 MED ORDER — ONDANSETRON HCL 4 MG PO TABS: 4.0000 mg | ORAL_TABLET | Freq: Four times a day (QID) | ORAL | Status: DC | PRN

## 2015-12-29 MED ORDER — BISACODYL 5 MG PO TBEC: 5.0000 mg | DELAYED_RELEASE_TABLET | Freq: Every day | ORAL | Status: DC | PRN

## 2015-12-29 MED ORDER — SENNOSIDES-DOCUSATE SODIUM 8.6-50 MG PO TABS: 1.0000 | ORAL_TABLET | Freq: Every evening | ORAL | Status: DC | PRN

## 2015-12-29 MED ORDER — METOCLOPRAMIDE HCL 5 MG/ML IJ SOLN
5.0000 mg | Freq: Three times a day (TID) | INTRAMUSCULAR | Status: DC | PRN
Start: 2015-12-29 — End: 2015-12-31

## 2015-12-29 MED ORDER — DIPHENHYDRAMINE HCL 12.5 MG/5ML PO ELIX: 12.5000 mg | ORAL_SOLUTION | ORAL | Status: DC | PRN

## 2015-12-29 MED ORDER — HYDROCHLOROTHIAZIDE 12.5 MG PO CAPS
12.5000 mg | ORAL_CAPSULE | Freq: Every morning | ORAL | Status: DC
  Administered 2015-12-30: 12.5 mg via ORAL
  Filled 2015-12-29 (×2): qty 1

## 2015-12-29 MED ORDER — MIDAZOLAM HCL 2 MG/2ML IJ SOLN
INTRAMUSCULAR | Status: AC
  Filled 2015-12-29: qty 2

## 2015-12-29 MED ORDER — PROPOFOL 10 MG/ML IV BOLUS
INTRAVENOUS | Status: DC | PRN
  Administered 2015-12-29: 100 mg via INTRAVENOUS
  Administered 2015-12-29 (×2): 20 mg via INTRAVENOUS
  Administered 2015-12-29: 40 mg via INTRAVENOUS

## 2015-12-29 MED ORDER — ALUM & MAG HYDROXIDE-SIMETH 200-200-20 MG/5ML PO SUSP: 30.0000 mL | ORAL | Status: DC | PRN

## 2015-12-29 MED ORDER — ONDANSETRON HCL 4 MG/2ML IJ SOLN
INTRAMUSCULAR | Status: DC | PRN
  Administered 2015-12-29: 4 mg via INTRAVENOUS

## 2015-12-29 MED ORDER — SIMVASTATIN 20 MG PO TABS
20.0000 mg | ORAL_TABLET | Freq: Every day | ORAL | Status: DC
  Administered 2015-12-30: 20 mg via ORAL
  Filled 2015-12-29 (×2): qty 1

## 2015-12-29 MED ORDER — OXYCODONE HCL 5 MG PO TABS
5.0000 mg | ORAL_TABLET | ORAL | Status: DC | PRN
  Administered 2015-12-29 – 2015-12-31 (×9): 10 mg via ORAL
  Administered 2015-12-31: 5 mg via ORAL
  Filled 2015-12-29 (×9): qty 2

## 2015-12-29 MED ORDER — LACTATED RINGERS IV SOLN
INTRAVENOUS | Status: DC
  Administered 2015-12-29 (×3): via INTRAVENOUS

## 2015-12-29 MED ORDER — PRIMIDONE 50 MG PO TABS
100.0000 mg | ORAL_TABLET | Freq: Every morning | ORAL | Status: DC
  Administered 2015-12-30 – 2015-12-31 (×2): 100 mg via ORAL
  Filled 2015-12-29 (×2): qty 2

## 2015-12-29 MED ORDER — DEXAMETHASONE SODIUM PHOSPHATE 10 MG/ML IJ SOLN
10.0000 mg | Freq: Once | INTRAMUSCULAR | Status: AC
  Administered 2015-12-30: 10 mg via INTRAVENOUS
  Filled 2015-12-29: qty 1

## 2015-12-29 MED ORDER — ACETAMINOPHEN 325 MG PO TABS: 650.0000 mg | ORAL_TABLET | Freq: Four times a day (QID) | ORAL | Status: DC | PRN

## 2015-12-29 MED ORDER — CEFAZOLIN SODIUM-DEXTROSE 2-4 GM/100ML-% IV SOLN
2.0000 g | Freq: Four times a day (QID) | INTRAVENOUS | Status: AC
  Administered 2015-12-29 (×2): 2 g via INTRAVENOUS
  Filled 2015-12-29 (×2): qty 100

## 2015-12-29 MED ORDER — BUPIVACAINE HCL (PF) 0.5 % IJ SOLN
INTRAMUSCULAR | Status: AC
  Filled 2015-12-29: qty 30

## 2015-12-29 MED ORDER — PHENYLEPHRINE HCL 10 MG/ML IJ SOLN
INTRAMUSCULAR | Status: DC | PRN
  Administered 2015-12-29: 80 ug via INTRAVENOUS

## 2015-12-29 MED ORDER — PROPRANOLOL HCL ER 80 MG PO CP24
80.0000 mg | ORAL_CAPSULE | Freq: Every day | ORAL | Status: DC
  Administered 2015-12-30: 80 mg via ORAL
  Filled 2015-12-29 (×2): qty 1

## 2015-12-29 MED ORDER — DIAZEPAM 2 MG PO TABS
2.0000 mg | ORAL_TABLET | Freq: Three times a day (TID) | ORAL | Status: DC | PRN
  Administered 2015-12-30: 2 mg via ORAL
  Filled 2015-12-29: qty 1

## 2015-12-29 MED ORDER — MENTHOL 3 MG MT LOZG
1.0000 | LOZENGE | OROMUCOSAL | Status: DC | PRN
Start: 2015-12-29 — End: 2015-12-31

## 2015-12-29 MED ORDER — OXYCODONE HCL 5 MG PO TABS
ORAL_TABLET | ORAL | Status: AC
  Administered 2015-12-29: 10 mg via ORAL
  Filled 2015-12-29: qty 2

## 2015-12-29 SURGICAL SUPPLY — 54 items
BLADE SAW SGTL 18X1.27X75 (BLADE) ×2 IMPLANT
BLADE SAW SGTL 18X1.27X75MM (BLADE) ×1
BLADE SURG ROTATE 9660 (MISCELLANEOUS) IMPLANT
CAPT HIP TOTAL 2 ×3 IMPLANT
CELLS DAT CNTRL 66122 CELL SVR (MISCELLANEOUS) ×1 IMPLANT
COVER SURGICAL LIGHT HANDLE (MISCELLANEOUS) ×3 IMPLANT
DRAPE C-ARM 42X72 X-RAY (DRAPES) ×3 IMPLANT
DRAPE IMP U-DRAPE 54X76 (DRAPES) ×9 IMPLANT
DRAPE INCISE IOBAN 66X45 STRL (DRAPES) ×3 IMPLANT
DRAPE ORTHO SPLIT 77X108 STRL (DRAPES) ×4
DRAPE PROXIMA HALF (DRAPES) ×3 IMPLANT
DRAPE STERI IOBAN 125X83 (DRAPES) ×3 IMPLANT
DRAPE SURG 17X23 STRL (DRAPES) ×3 IMPLANT
DRAPE SURG ORHT 6 SPLT 77X108 (DRAPES) ×2 IMPLANT
DRAPE U-SHAPE 47X51 STRL (DRAPES) ×3 IMPLANT
DRSG AQUACEL AG ADV 3.5X10 (GAUZE/BANDAGES/DRESSINGS) ×3 IMPLANT
DURAPREP 26ML APPLICATOR (WOUND CARE) ×3 IMPLANT
ELECT BLADE 4.0 EZ CLEAN MEGAD (MISCELLANEOUS)
ELECT CAUTERY BLADE 6.4 (BLADE) ×3 IMPLANT
ELECT REM PT RETURN 9FT ADLT (ELECTROSURGICAL) ×3
ELECTRODE BLDE 4.0 EZ CLN MEGD (MISCELLANEOUS) IMPLANT
ELECTRODE REM PT RTRN 9FT ADLT (ELECTROSURGICAL) ×1 IMPLANT
FACESHIELD WRAPAROUND (MASK) ×6 IMPLANT
GLOVE BIOGEL PI IND STRL 7.0 (GLOVE) IMPLANT
GLOVE BIOGEL PI INDICATOR 7.0 (GLOVE)
GLOVE ECLIPSE 7.0 STRL STRAW (GLOVE) IMPLANT
GLOVE ORTHO TXT STRL SZ7.5 (GLOVE) ×6 IMPLANT
GOWN STRL REUS W/ TWL LRG LVL3 (GOWN DISPOSABLE) ×3 IMPLANT
GOWN STRL REUS W/ TWL XL LVL3 (GOWN DISPOSABLE) ×1 IMPLANT
GOWN STRL REUS W/TWL LRG LVL3 (GOWN DISPOSABLE) ×6
GOWN STRL REUS W/TWL XL LVL3 (GOWN DISPOSABLE) ×2
KIT BASIN OR (CUSTOM PROCEDURE TRAY) ×3 IMPLANT
KIT ROOM TURNOVER OR (KITS) ×3 IMPLANT
MANIFOLD NEPTUNE II (INSTRUMENTS) ×3 IMPLANT
NDL SAFETY ECLIPSE 18X1.5 (NEEDLE) ×1 IMPLANT
NEEDLE HYPO 18GX1.5 SHARP (NEEDLE) ×2
NS IRRIG 1000ML POUR BTL (IV SOLUTION) ×3 IMPLANT
PACK TOTAL JOINT (CUSTOM PROCEDURE TRAY) ×3 IMPLANT
PAD ARMBOARD 7.5X6 YLW CONV (MISCELLANEOUS) ×6 IMPLANT
RTRCTR WOUND ALEXIS 18CM MED (MISCELLANEOUS) ×3
SUCTION FRAZIER HANDLE 10FR (MISCELLANEOUS) ×2
SUCTION TUBE FRAZIER 10FR DISP (MISCELLANEOUS) ×1 IMPLANT
SUT MNCRL AB 4-0 PS2 18 (SUTURE) ×3 IMPLANT
SUT VIC AB 0 CT1 27 (SUTURE) ×2
SUT VIC AB 0 CT1 27XBRD ANBCTR (SUTURE) ×1 IMPLANT
SUT VIC AB 1 CT1 27 (SUTURE)
SUT VIC AB 1 CT1 27XBRD ANBCTR (SUTURE) IMPLANT
SUT VIC AB 2-0 CT1 27 (SUTURE) ×4
SUT VIC AB 2-0 CT1 TAPERPNT 27 (SUTURE) ×2 IMPLANT
SYR 50ML LL SCALE MARK (SYRINGE) ×3 IMPLANT
TOWEL OR 17X24 6PK STRL BLUE (TOWEL DISPOSABLE) ×3 IMPLANT
TOWEL OR 17X26 10 PK STRL BLUE (TOWEL DISPOSABLE) ×3 IMPLANT
TRAY FOLEY CATH 14FR (SET/KITS/TRAYS/PACK) IMPLANT
WATER STERILE IRR 1000ML POUR (IV SOLUTION) ×3 IMPLANT

## 2015-12-29 NOTE — Anesthesia Procedure Notes (Signed)
Procedure Name: Intubation Date/Time: 12/29/2015 8:45 AM Performed by: Scheryl Darter Pre-anesthesia Checklist: Patient identified, Emergency Drugs available, Suction available and Patient being monitored Patient Re-evaluated:Patient Re-evaluated prior to inductionOxygen Delivery Method: Circle System Utilized Preoxygenation: Pre-oxygenation with 100% oxygen Intubation Type: IV induction Ventilation: Mask ventilation without difficulty Laryngoscope Size: Miller and 2 Grade View: Grade I Tube type: Oral Tube size: 7.5 mm Number of attempts: 1 Airway Equipment and Method: Stylet Placement Confirmation: ETT inserted through vocal cords under direct vision,  positive ETCO2 and breath sounds checked- equal and bilateral Secured at: 23 cm Tube secured with: Tape Dental Injury: Teeth and Oropharynx as per pre-operative assessment

## 2015-12-29 NOTE — Interval H&P Note (Signed)
History and Physical Interval Note:  12/29/2015 8:28 AM  Catherine Lowery  has presented today for surgery, with the diagnosis of djd left hip  The various methods of treatment have been discussed with the patient and family. After consideration of risks, benefits and other options for treatment, the patient has consented to  Procedure(s): TOTAL HIP ARTHROPLASTY ANTERIOR APPROACH (Left) as a surgical intervention .  The patient's history has been reviewed, patient examined, no change in status, stable for surgery.  I have reviewed the patient's chart and labs.  Questions were answered to the patient's satisfaction.     Catherine Lowery

## 2015-12-29 NOTE — Discharge Instructions (Signed)
INSTRUCTIONS AFTER JOINT REPLACEMENT   o Remove items at home which could result in a fall. This includes throw rugs or furniture in walking pathways o ICE to the affected joint every three hours while awake for 30 minutes at a time, for at least the first 3-5 days, and then as needed for pain and swelling.  Continue to use ice for pain and swelling. You may notice swelling that will progress down to the foot and ankle.  This is normal after surgery.  Elevate your leg when you are not up walking on it.   o Continue to use the breathing machine you got in the hospital (incentive spirometer) which will help keep your temperature down.  It is common for your temperature to cycle up and down following surgery, especially at night when you are not up moving around and exerting yourself.  The breathing machine keeps your lungs expanded and your temperature down.   DIET:  As you were doing prior to hospitalization, we recommend a well-balanced diet.  DRESSING / WOUND CARE / SHOWERING  Keep the surgical dressing until follow up.  The dressing is water proof, so you can shower without any extra covering.  IF THE DRESSING FALLS OFF or the wound gets wet inside, change the dressing with sterile gauze.  Please use good hand washing techniques before changing the dressing.  Do not use any lotions or creams on the incision until instructed by your surgeon.    ACTIVITY  o Increase activity slowly as tolerated, but follow the weight bearing instructions below.   o No driving for 6 weeks or until further direction given by your physician.  You cannot drive while taking narcotics.  o No lifting or carrying greater than 10 lbs. until further directed by your surgeon. o Avoid periods of inactivity such as sitting longer than an hour when not asleep. This helps prevent blood clots.  o You may return to work once you are authorized by your doctor.     WEIGHT BEARING   Weight bearing as tolerated with assist  device (walker, cane, etc) as directed, use it as long as suggested by your surgeon or therapist, typically at least 4-6 weeks.   CONSTIPATION  Constipation is defined medically as fewer than three stools per week and severe constipation as less than one stool per week.  Even if you have a regular bowel pattern at home, your normal regimen is likely to be disrupted due to multiple reasons following surgery.  Combination of anesthesia, postoperative narcotics, change in appetite and fluid intake all can affect your bowels.   YOU MUST use at least one of the following options; they are listed in order of increasing strength to get the job done.  They are all available over the counter, and you may need to use some, POSSIBLY even all of these options:    Drink plenty of fluids (prune juice may be helpful) and high fiber foods Colace 100 mg by mouth twice a day  Senokot for constipation as directed and as needed Dulcolax (bisacodyl), take with full glass of water  Miralax (polyethylene glycol) once or twice a day as needed.  If you have tried all these things and are unable to have a bowel movement in the first 3-4 days after surgery call either your surgeon or your primary doctor.    If you experience loose stools or diarrhea, hold the medications until you stool forms back up.  If your symptoms do not get   better within 1 week or if they get worse, check with your doctor.  If you experience "the worst abdominal pain ever" or develop nausea or vomiting, please contact the office immediately for further recommendations for treatment.   ITCHING:  If you experience itching with your medications, try taking only a single pain pill, or even half a pain pill at a time.  You can also use Benadryl over the counter for itching or also to help with sleep.   TED HOSE STOCKINGS:  Use stockings on both legs until for at least 2 weeks or as directed by physician office. They may be removed at night for  sleeping.  MEDICATIONS:  See your medication summary on the "After Visit Summary" that nursing will review with you.  You may have some home medications which will be placed on hold until you complete the course of blood thinner medication.  It is important for you to complete the blood thinner medication as prescribed.  PRECAUTIONS:  If you experience chest pain or shortness of breath - call 911 immediately for transfer to the hospital emergency department.   If you develop a fever greater that 101 F, purulent drainage from wound, increased redness or drainage from wound, foul odor from the wound/dressing, or calf pain - CONTACT YOUR SURGEON.                                                   FOLLOW-UP APPOINTMENTS:  If you do not already have a post-op appointment, please call the office for an appointment to be seen by your surgeon.  Guidelines for how soon to be seen are listed in your "After Visit Summary", but are typically between 1-4 weeks after surgery.  OTHER INSTRUCTIONS:   Knee Replacement:  Do not place pillow under knee, focus on keeping the knee straight while resting. CPM instructions: 0-90 degrees, 2 hours in the morning, 2 hours in the afternoon, and 2 hours in the evening. Place foam block, curve side up under heel at all times except when in CPM or when walking.  DO NOT modify, tear, cut, or change the foam block in any way.  MAKE SURE YOU:  . Understand these instructions.  . Get help right away if you are not doing well or get worse.    Thank you for letting us be a part of your medical care team.  It is a privilege we respect greatly.  We hope these instructions will help you stay on track for a fast and full recovery!    

## 2015-12-29 NOTE — Transfer of Care (Signed)
Immediate Anesthesia Transfer of Care Note  Patient: Catherine Lowery  Procedure(s) Performed: Procedure(s): TOTAL HIP ARTHROPLASTY ANTERIOR APPROACH (Left)  Patient Location: PACU  Anesthesia Type:General  Level of Consciousness: awake, alert , oriented and sedated  Airway & Oxygen Therapy: Patient Spontanous Breathing and Patient connected to nasal cannula oxygen  Post-op Assessment: Report given to RN, Post -op Vital signs reviewed and stable and Patient moving all extremities  Post vital signs: Reviewed and stable  Last Vitals:  Vitals:   12/29/15 0706 12/29/15 1123  BP: (!) 129/51 120/63  Pulse: (!) 59 66  Resp: 20 15  Temp: 36.8 C 36.5 C    Last Pain:  Vitals:   12/29/15 1123  TempSrc:   PainSc: Asleep      Patients Stated Pain Goal: 4 ([id number])  Complications: No apparent anesthesia complications

## 2015-12-29 NOTE — Discharge Summary (Addendum)
Patient ID: [name] MRN: [id number] DOB/AGE: Jan 19, 1946 70 y.o.  Admit date: 12/29/2015 Discharge date: 12/31/2015  Admission Diagnoses:  Active Problems:   Primary localized osteoarthritis of left hip   Discharge Diagnoses:  Same  Past Medical History:  Diagnosis Date  . DJD (degenerative joint disease)    LEFT HIP  . Hyperlipidemia    takes Simvasatin daily  . Hypertension    takes Propranolol and HCTZ daioly  . Hyperthyroidism   . Pneumonia 10/2015  . PONV (postoperative nausea and vomiting)   . Shortness of breath dyspnea    with ambulation  . Tremors of nervous system    takes Primidone daily    Surgeries: Procedure(s): TOTAL HIP ARTHROPLASTY ANTERIOR APPROACH on 12/29/2015   Consultants:   Discharged Condition: Improved  Hospital Course: Catherine Lowery is an 70 y.o. female who was admitted 12/29/2015 for operative treatment of primary localized osteoarthritis left hip. Patient has severe unremitting pain that affects sleep, daily activities, and work/hobbies. After pre-op clearance the patient was taken to the operating room on 12/29/2015 and underwent  Procedure(s): TOTAL HIP ARTHROPLASTY ANTERIOR APPROACH.  Patient with a pre-op Hb of 11.3 developed ABLA on pod#1 with a Hb of 8.4.  She is currently stable but we will continue to follow.  Patient was given perioperative antibiotics:  Anti-infectives    Start     Dose/Rate Route Frequency Ordered Stop   12/29/15 1500  ceFAZolin (ANCEF) IVPB 2g/100 mL premix     2 g 200 mL/hr over 30 Minutes Intravenous Every 6 hours 12/29/15 1133 12/29/15 2202   12/29/15 0554  ceFAZolin (ANCEF) IVPB 2g/100 mL premix     2 g 200 mL/hr over 30 Minutes Intravenous On call to O.R. 12/29/15 [id number] 12/29/15 [id number]       Patient was given sequential compression devices, early ambulation, and chemoprophylaxis to prevent DVT.  Patient benefited maximally from hospital stay and there were no complications.    Recent vital signs:   Patient Vitals for the past 24 hrs:  BP Temp Temp src Pulse Resp SpO2  12/31/15 0541 (!) 101/52 98.6 F (37 C) Oral 68 16 97 %  12/30/15 2017 (!) 107/52 98.4 F (36.9 C) Oral 72 16 98 %  12/30/15 1313 (!) 122/55 98.1 F (36.7 C) Oral 68 18 99 %     Recent laboratory studies:   Recent Labs  12/30/15 0312 12/31/15 0426  WBC 6.7 8.8  HGB 8.4* 7.3*  HCT 26.3* 22.9*  PLT 166 140*  NA 140 139  K 4.7 4.0  CL 109 105  CO2 25 26  BUN 21* 20  CREATININE 1.15* 1.16*  GLUCOSE 110* 110*  CALCIUM 8.4* 8.7*     Discharge Medications:     Medication List    TAKE these medications   aspirin 325 MG tablet Take 325 mg by mouth daily. What changed:  Another medication with the same name was added. Make sure you understand how and when to take each.   aspirin EC 325 MG tablet Take 1 tablet (325 mg total) by mouth daily. 1 tab a day for the next 30 days to prevent blood clots What changed:  You were already taking a medication with the same name, and this prescription was added. Make sure you understand how and when to take each.   hydrochlorothiazide 12.5 MG capsule Commonly known as:  MICROZIDE Take 12.5 mg by mouth every morning.   ondansetron 4 MG tablet Commonly known as:  ZOFRAN  Take 1 tablet (4 mg total) by mouth every 8 (eight) hours as needed for nausea or vomiting.   oxyCODONE-acetaminophen 5-325 MG tablet Commonly known as:  PERCOCET Take 1-2 tablets by mouth every 4 (four) hours as needed for severe pain.   primidone 50 MG tablet Commonly known as:  MYSOLINE Take 100 mg by mouth every morning.   propranolol ER 80 MG 24 hr capsule Commonly known as:  INDERAL LA Take 80 mg by mouth daily.   simvastatin 20 MG tablet Commonly known as:  ZOCOR TAKE 1 TABLET EVERY DAY IN THE MORNING What changed:  See the new instructions.   Vitamin D (Ergocalciferol) 50000 units Caps capsule Commonly known as:  DRISDOL Take 50,000 Units by mouth every 7 (seven) days.  sundays       Diagnostic Studies: US Soft Tissue Head/neck  Result Date: 12/09/2015 CLINICAL DATA:  Family history of thyroid cancer. Thyrotoxicosis without storm. Heterogeneous uptake throughout both lobes noted by nuclear medicine thyroid imaging. EXAM: THYROID ULTRASOUND TECHNIQUE: Ultrasound examination of the thyroid gland and adjacent soft tissues was performed. COMPARISON:  None. FINDINGS: Right thyroid lobe Measurements: 5.9 x 2.0 x 2.4 cm. Multiple nodules are scattered throughout the right lobe. 1.3 x 0.7 x 0.9 cm mid lobe solid nodule. 1.7 x 1.3 x 0.8 cm upper pole solid nodule. 1.9 x 0.9 x 1.2 cm lower pole nodule. Other scattered smaller nodules are noted. Left thyroid lobe Measurements: 5.6 x 1.5 x 2.0 cm. Multiple solid nodules are scattered throughout the left lobe. 1.0 x 1.1 x 1.4 cm mid lobe nodule. 2.1 x 0.9 x 1.2 cm lower pole nodule. 1.4 x 1.0 x 1.1 cm upper pole nodule. Other smaller nodules are noted. Isthmus Thickness: 0.4 cm.  Small subcentimeter nodules are present. Lymphadenopathy None visualized. IMPRESSION: Multiple bilateral nodules are seen throughout the thyroid gland. The largest on left measures 2.1 cm and the largest on the right measures 1.9 cm. Findings meet consensus criteria for biopsy. Ultrasound-guided fine needle aspiration should be considered, as per the consensus statement: Management of Thyroid Nodules Detected at [place]: Society of Radiologists in [place]. Radiology 2005; [id number]. Electronically Signed   By: Marybelle Killings M.D.   On: 12/09/2015 16:31   Dg C-arm 1-60 Min  Result Date: 12/29/2015 CLINICAL DATA:  Status post total hip replaced EXAM: DG C-ARM 61-120 MIN; OPERATIVE LEFT HIP WITH PELVIS COMPARISON:  None. FLUOROSCOPY TIME:  0 minutes 26 seconds; 2 acquired image FINDINGS: Frontal view obtained. There is a total hip prosthesis on the left with prosthetic components appearing well-seated on single view. No acute  fracture or dislocation. IMPRESSION: Prosthetic components appear well seated on single view. No acute fracture or dislocation evident. Electronically Signed   By: Lowella Grip III M.D.   On: 12/29/2015 10:58   Dg Hip Operative Unilat W Or W/o Pelvis Left  Result Date: 12/29/2015 CLINICAL DATA:  Status post total hip replaced EXAM: DG C-ARM 61-120 MIN; OPERATIVE LEFT HIP WITH PELVIS COMPARISON:  None. FLUOROSCOPY TIME:  0 minutes 26 seconds; 2 acquired image FINDINGS: Frontal view obtained. There is a total hip prosthesis on the left with prosthetic components appearing well-seated on single view. No acute fracture or dislocation. IMPRESSION: Prosthetic components appear well seated on single view. No acute fracture or dislocation evident. Electronically Signed   By: Lowella Grip III M.D.   On: 12/29/2015 10:58    Disposition: 01-Home or Self Care    Follow-up Information  Ninetta Lights, MD. Schedule an appointment as soon as possible for a visit in 2 week(s).   Specialty:  Orthopedic Surgery Contact information: [address] [place] [address] [phone]            Signed: Fannie Knee 12/31/2015, 8:43 AM

## 2015-12-29 NOTE — H&P (View-Only) (Signed)
TOTAL HIP ADMISSION H&P  Patient is admitted for left total hip arthroplasty.  Subjective:  Chief Complaint: left hip pain  HPI: Catherine Lowery, 70 y.o. female, has a history of pain and functional disability in the left hip(s) due to arthritis and patient has failed non-surgical conservative treatments for greater than 12 weeks to include NSAID's and/or analgesics, corticosteriod injections and activity modification.  Onset of symptoms was abrupt starting 1 years ago with rapidlly worsening course since that time.The patient noted no past surgery on the left hip(s).  Patient currently rates pain in the left hip at 1 out of 10 with activity. Patient has night pain, worsening of pain with activity and weight bearing, trendelenberg gait, pain that interfers with activities of daily living and pain with passive range of motion. Patient has evidence of subchondral sclerosis and joint space narrowing by imaging studies. This condition presents safety issues increasing the risk of falls. This patient has had .  There is no current active infection.  Patient Active Problem List   Diagnosis Date Noted  . Encounter for screening colonoscopy 02/18/2015  . 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 Hyperlipidemia 01/12/2015  . Benign essential HTN 01/12/2015  . Tobacco abuse 01/12/2015  . Atypical chest pain 01/11/2015   Past Medical History:  Diagnosis Date  . Hyperlipidemia   . Hypertension   . Hyperthyroidism   . Tremors of nervous system     Past Surgical History:  Procedure Laterality Date  . BOne Spur    . JOINT REPLACEMENT    . KNEE SURGERY    . ROTATOR CUFF REPAIR Bilateral      (Not in a hospital admission) Allergies  Allergen Reactions  . Morphine And Related Itching  . Other Itching and Nausea And Vomiting    Pain medications  . Percocet [Oxycodone-Acetaminophen] Nausea And Vomiting    Social History  Substance Use Topics  . Smoking status: Current Every Day Smoker   Packs/day: 0.50    Years: 20.00  . Smokeless tobacco: Not on file  . Alcohol use No    Family History  Problem Relation Age of Onset  . Hypertension Mother   . Heart disease Mother   . Cancer Father      Review of Systems  Constitutional: Negative.   HENT: Negative.   Eyes: Negative.   Respiratory: Negative.   Cardiovascular: Negative.   Gastrointestinal: Negative.   Genitourinary: Negative.   Musculoskeletal: Positive for back pain and joint pain.  Skin: Negative.   Neurological: Positive for tremors.  Endo/Heme/Allergies: Negative.   Psychiatric/Behavioral: Negative.     Objective:  Physical Exam  Constitutional: She is oriented to person, place, and time. She appears well-developed and well-nourished.  HENT:  Head: Normocephalic and atraumatic.  Eyes: EOM are normal. Pupils are equal, round, and reactive to light.  Neck: Normal range of motion. Neck supple.  Cardiovascular: Normal rate and regular rhythm.   Respiratory: Effort normal and breath sounds normal.  GI: Soft. Bowel sounds are normal.  Musculoskeletal:  Positive log roll left greater than right but fairly good motion.  There is a little bit of pain with straight leg raise both sides. There is soreness in her lower back but there is not a predominant radiculopathy from what I am seeing.   Neurological: She is alert and oriented to person, place, and time.  Skin: Skin is warm and dry.  Psychiatric: She has a normal mood and affect. Her behavior is normal. Judgment and thought content normal.  Vital signs in last 24 hours: @VSRANGES @  Labs:   Estimated body mass index is 24.89 kg/m as calculated from the following:   Height as of 03/15/15: 5' 9.5" (1.765 m).   Weight as of 11/15/15: 77.6 kg (171 lb).   Imaging Review Plain radiographs demonstrate severe degenerative joint disease of the left hip(s). The bone quality appears to be fair for age and reported activity level.  Assessment/Plan:  End  stage arthritis, left hip(s)  The patient history, physical examination, clinical judgement of the provider and imaging studies are consistent with end stage degenerative joint disease of the left hip(s) and total hip arthroplasty is deemed medically necessary. The treatment options including medical management, injection therapy, arthroscopy and arthroplasty were discussed at length. The risks and benefits of total hip arthroplasty were presented and reviewed. The risks due to aseptic loosening, infection, stiffness, dislocation/subluxation,  thromboembolic complications and other imponderables were discussed.  The patient acknowledged the explanation, agreed to proceed with the plan and consent was signed. Patient is being admitted for inpatient treatment for surgery, pain control, PT, OT, prophylactic antibiotics, VTE prophylaxis, progressive ambulation and ADL's and discharge planning.The patient is planning to be discharged to skilled nursing facility

## 2015-12-29 NOTE — Evaluation (Signed)
Physical Therapy Evaluation Patient Details Name: Catherine Lowery MRN: [id number] DOB: 07/27/1945 Today's Date: 12/29/2015   History of Present Illness  Patient is a 70 y/o female admitted s/p L anterior THA.  Clinical Impression  Patient presents with decreased independence with mobility due to deficits listed in PT problem list.   She will benefit from skilled PT in the acute setting to allow return home following SNF rehab stay.     Follow Up Recommendations SNF    Equipment Recommendations  Rolling walker with 5" wheels (has walker at home, reports not hers)    Recommendations for Other Services       Precautions / Restrictions Precautions Precautions: Fall      Mobility  Bed Mobility Overal bed mobility: Needs Assistance Bed Mobility: Supine to Sit;Sit to Supine     Supine to sit: Max assist Sit to supine: Mod assist   General bed mobility comments: assist to scoot, lift trunk and get L LE off bed, assist for leg into bed to supine with cues  Transfers Overall transfer level: Needs assistance Equipment used: Rolling walker (2 wheeled) Transfers: Sit to/from [facility] Sit to Stand: Max assist Stand pivot transfers: Mod assist       General transfer comment: up from EOB with RW and lifting help; assist to sit with cues and lowering help, stand pivot with RW with assist for balance and cues  Ambulation/Gait                Stairs            Wheelchair Mobility    Modified Rankin (Stroke Patients Only)       Balance Overall balance assessment: Needs assistance   Sitting balance-Leahy Scale: Fair     Standing balance support: Bilateral upper extremity supported Standing balance-Leahy Scale: Poor Standing balance comment: UE support for balance                             Pertinent Vitals/Pain Pain Assessment: Faces Faces Pain Scale: Hurts whole lot Pain Location: L hip with movement Pain Descriptors /  Indicators: Discomfort;Sore;Operative site guarding Pain Intervention(s): Monitored during session;Limited activity within patient's tolerance;Repositioned    Home Living Family/patient expects to be discharged to:: Skilled nursing facility ([facility]) Living Arrangements: Children               Additional Comments: lives with son who is disabled in one level home with 4 steps or ramp to enter    Prior Function Level of Independence: Independent               Hand Dominance        Extremity/Trunk Assessment               Lower Extremity Assessment: LLE deficits/detail   LLE Deficits / Details: limited hip flexion with pain, strength at least 3/5 knee extension      Communication   Communication: No difficulties  Cognition Arousal/Alertness: Lethargic Behavior During Therapy: WFL for tasks assessed/performed Overall Cognitive Status: Within Functional Limits for tasks assessed                      General Comments      Exercises        Assessment/Plan    PT Assessment Patient needs continued PT services  PT Diagnosis Difficulty walking;Acute pain   PT Problem List Decreased activity tolerance;Decreased strength;Decreased balance;Decreased  range of motion;Decreased mobility;Pain;Decreased safety awareness  PT Treatment Interventions DME instruction;Gait training;Functional mobility training;Balance training;Therapeutic activities;Therapeutic exercise;Patient/family education   PT Goals (Current goals can be found in the Care Plan section) Acute Rehab PT Goals Patient Stated Goal: To go the rehab PT Goal Formulation: With patient/family Time For Goal Achievement: 01/05/16 Potential to Achieve Goals: Good    Frequency 7X/week   Barriers to discharge        Co-evaluation               End of Session   Activity Tolerance:  (nausea) Patient left: in bed;with call bell/phone within reach           Time: 1510-1528 PT  Time Calculation (min) (ACUTE ONLY): 18 min   Charges:   PT Evaluation $PT Eval Moderate Complexity: 1 Procedure     PT G CodesReginia Naas 01/18/16, 3:39 PM  Magda Kiel, [place] 01/18/2016

## 2015-12-29 NOTE — Anesthesia Postprocedure Evaluation (Signed)
Anesthesia Post Note  Patient: Catherine Lowery  Procedure(s) Performed: Procedure(s) (LRB): TOTAL HIP ARTHROPLASTY ANTERIOR APPROACH (Left)  Patient location during evaluation: PACU Anesthesia Type: General Level of consciousness: awake and alert Pain management: pain level controlled Vital Signs Assessment: post-procedure vital signs reviewed and stable Respiratory status: spontaneous breathing, nonlabored ventilation, respiratory function stable and patient connected to nasal cannula oxygen Cardiovascular status: blood pressure returned to baseline and stable Postop Assessment: no signs of nausea or vomiting Anesthetic complications: no    Last Vitals:  Vitals:   12/29/15 1123 12/29/15 1145  BP: 120/63 96/75  Pulse: 66 (!) 56  Resp: 15 15  Temp: 36.5 C     Last Pain:  Vitals:   12/29/15 1123  TempSrc:   PainSc: Asleep                 Zenaida Deed

## 2015-12-29 NOTE — Anesthesia Preprocedure Evaluation (Signed)
Anesthesia Evaluation  Patient identified by MRN, date of birth, ID band Patient awake    Reviewed: Allergy & Precautions, H&P , NPO status , Patient's Chart, lab work & pertinent test results  History of Anesthesia Complications (+) PONV and history of anesthetic complications  Airway Mallampati: II  TM Distance: >3 FB Neck ROM: full    Dental no notable dental hx.    Pulmonary Current Smoker,    Pulmonary exam normal breath sounds clear to auscultation       Cardiovascular hypertension, Normal cardiovascular exam Rhythm:regular Rate:Normal     Neuro/Psych negative neurological ROS     GI/Hepatic negative GI ROS, Neg liver ROS,   Endo/Other  Hyperthyroidism   Renal/GU negative Renal ROS     Musculoskeletal   Abdominal   Peds  Hematology negative hematology ROS (+)   Anesthesia Other Findings   Reproductive/Obstetrics negative OB ROS                             Anesthesia Physical Anesthesia Plan  ASA: II  Anesthesia Plan: General   Post-op Pain Management:    Induction: Intravenous  Airway Management Planned: Oral ETT  Additional Equipment:   Intra-op Plan:   Post-operative Plan: Extubation in OR  Informed Consent: I have reviewed the patients History and Physical, chart, labs and discussed the procedure including the risks, benefits and alternatives for the proposed anesthesia with the patient or authorized representative who has indicated his/her understanding and acceptance.   Dental Advisory Given  Plan Discussed with: Anesthesiologist, CRNA and Surgeon  Anesthesia Plan Comments:         Anesthesia Quick Evaluation

## 2015-12-30 ENCOUNTER — Encounter (HOSPITAL_COMMUNITY): Payer: Self-pay | Admitting: Orthopedic Surgery

## 2015-12-30 LAB — CBC
HCT: 26.3 % — ABNORMAL LOW (ref 36.0–46.0)
HEMOGLOBIN: 8.4 g/dL — AB (ref 12.0–15.0)
MCH: 22.3 pg — ABNORMAL LOW (ref 26.0–34.0)
MCHC: 31.9 g/dL (ref 30.0–36.0)
MCV: 69.9 fL — ABNORMAL LOW (ref 78.0–100.0)
Platelets: 166 10*3/uL (ref 150–400)
RBC: 3.76 MIL/uL — AB (ref 3.87–5.11)
RDW: 16.1 % — ABNORMAL HIGH (ref 11.5–15.5)
WBC: 6.7 10*3/uL (ref 4.0–10.5)

## 2015-12-30 LAB — BASIC METABOLIC PANEL
ANION GAP: 6 (ref 5–15)
BUN: 21 mg/dL — ABNORMAL HIGH (ref 6–20)
CHLORIDE: 109 mmol/L (ref 101–111)
CO2: 25 mmol/L (ref 22–32)
Calcium: 8.4 mg/dL — ABNORMAL LOW (ref 8.9–10.3)
Creatinine, Ser: 1.15 mg/dL — ABNORMAL HIGH (ref 0.44–1.00)
GFR calc non Af Amer: 47 mL/min — ABNORMAL LOW (ref >60)
GFR, EST AFRICAN AMERICAN: 54 mL/min — AB (ref >60)
Glucose, Bld: 110 mg/dL — ABNORMAL HIGH (ref 65–99)
POTASSIUM: 4.7 mmol/L (ref 3.5–5.1)
SODIUM: 140 mmol/L (ref 135–145)

## 2015-12-30 NOTE — Progress Notes (Addendum)
Rept received from [facility]. Pt lying right side with family member at bedside. Pt repts pain is "tolerable". No visible s/sx distress and no c/o such. Will continue to monitor.

## 2015-12-30 NOTE — Clinical Social Work Note (Signed)
Clinical Social Work Assessment  Patient Details  Name: Catherine Lowery MRN: [id number] Date of Birth: 11/22/1945  Date of referral:  12/30/15               Reason for consult:  Facility Placement                Permission sought to share information with:   (Facilities) Permission granted to share information::   Psychologist, occupational)  Name::        Agency::     Relationship::     Contact Information:     Housing/Transportation Living arrangements for the past 2 months:  Single Family Home (Patient states that she lives home with her handicap son.) Source of Information:  Patient Patient Interpreter Needed:  None Criminal Activity/Legal Involvement Pertinent to Current Situation/Hospitalization:  No - Comment as needed Significant Relationships:  Other(Comment) (Sister/Louise [place] [phone]) Lives with:   (Handicap son, Per pt.) Do you feel safe going back to the place where you live?   (Patient is intetested in SNF.) Need for family participation in patient care:  Yes (Comment) (Patient states her sister is her primary support and [facility] POA.)  Care giving concerns:  Patient states that prior to her surgery she was completing ALDs. However, she states that she now needs assistance.   Social Worker assessment / plan:  SW met with pt at bedside. Patient states that she lives in [place] with her handicap son. Patient states that she would be agreeable to be referred to facilities. SW will refer pt to facilities.   Employment status:  Retired Forensic scientist:   [employer].) PT Recommendations:  [place] / Referral to community resources:   (Facililties...)  Patient/Family's Response to care:  Patient is appropriate.  Patient/Family's Understanding of and Emotional Response to Diagnosis, Current Treatment, and Prognosis:  Patient expressed no concerns and has no questions for SW.  Emotional Assessment Appearance:  Appears  stated age Attitude/Demeanor/Rapport:   (Appropriate.) Affect (typically observed):    Orientation:  Oriented to Self, Oriented to Place, Oriented to  Time, Oriented to Situation Alcohol / Substance use:  Not Applicable Psych involvement (Current and /or in the community):  No (Comment)  Discharge Needs  Concerns to be addressed:  No discharge needs identified Readmission within the last 30 days:  No Current discharge risk:  None Barriers to Discharge:  No Barriers Identified   Bernita Buffy 12/30/2015, 12:14 PM

## 2015-12-30 NOTE — Evaluation (Signed)
Occupational Therapy Evaluation Patient Details Name: Catherine Lowery MRN: [id number] DOB: [birth year] Today's Date: 12/30/2015    History of Present Illness Patient is a 70 y/o female admitted s/p L anterior THA.   Clinical Impression   Pt with decline in function and safety with ADLs and ADL mobility with decreased balance and acitivity tolerance. Pt would benefit from acute OT services to address impairments to increase level of function and safety    Follow Up Recommendations  SNF;Supervision/Assistance - 24 hour    Equipment Recommendations  Other (comment) (TBD at next venue of care)    Recommendations for Other Services       Precautions / Restrictions Precautions Precautions: Fall Restrictions Weight Bearing Restrictions: Yes      Mobility Bed Mobility               General bed mobility comments: pt sitting in recliner on arrival.    Transfers Overall transfer level: Needs assistance Equipment used: Rolling walker (2 wheeled) Transfers: Sit to/from Stand Sit to Stand: Min assist         General transfer comment: Cues for correct hand placement     Balance Overall balance assessment: Needs assistance Sitting-balance support: No upper extremity supported;Feet supported Sitting balance-Leahy Scale: Good     Standing balance support: During functional activity Standing balance-Leahy Scale: Fair                              ADL Overall ADL's : Needs assistance/impaired     Grooming: Wash/dry hands;Wash/dry face;Standing;Min guard;Cueing for safety   Upper Body Bathing: Supervision/ safety;Set up;Sitting   Lower Body Bathing: Moderate assistance   Upper Body Dressing : Supervision/safety;Set up;Sitting   Lower Body Dressing: Maximal assistance   Toilet Transfer: Minimal assistance;RW;Ambulation;Comfort height toilet;Grab bars;Cueing for safety   Toileting- Clothing Manipulation and Hygiene: Minimal assistance;Sit to/from  stand   Tub/ Shower Transfer: 3 in 1;Grab bars;Rolling walker;Ambulation;Cueing for safety;Minimal assistance   Functional mobility during ADLs: Minimal assistance;Cueing for safety;Rolling walker General ADL Comments: discussed possible DME and A/E needs after SNF d/c     Vision  no change from baseline              Pertinent Vitals/Pain Pain Assessment: 0-10 Pain Score: 4  Pain Location: L LE Pain Descriptors / Indicators: Sore Pain Intervention(s): Monitored during session;Premedicated before session;Repositioned;Ice applied     Hand Dominance Right   Extremity/Trunk Assessment Upper Extremity Assessment Upper Extremity Assessment: Generalized weakness   Lower Extremity Assessment Lower Extremity Assessment: Defer to PT evaluation       Communication Communication Communication: No difficulties   Cognition Arousal/Alertness: Awake/alert Behavior During Therapy: WFL for tasks assessed/performed Overall Cognitive Status: Within Functional Limits for tasks assessed                     General Comments   pt pleasant and cooperative                 Home Living Family/patient expects to be discharged to:: Assisted living Living Arrangements: Children                               Additional Comments: lives with son who is disabled in one level home with 4 steps or ramp to enter      Prior Functioning/Environment Level of Independence: Independent  OT Diagnosis: Generalized weakness;Acute pain   OT Problem List: Decreased activity tolerance;Impaired balance (sitting and/or standing);Decreased knowledge of use of DME or AE;Pain   OT Treatment/Interventions: Self-care/ADL training;DME and/or AE instruction;Therapeutic activities;Patient/family education    OT Goals(Current goals can be found in the care plan section) Acute Rehab OT Goals Patient Stated Goal: To go the rehab OT Goal Formulation: With patient Time For  Goal Achievement: 01/06/16 Potential to Achieve Goals: Good ADL Goals Pt Will Perform Grooming: with supervision;with set-up;standing Pt Will Perform Upper Body Bathing: with set-up;sitting Pt Will Perform Lower Body Bathing: with min assist;sitting/lateral leans;sit to/from stand Pt Will Perform Upper Body Dressing: with set-up;sitting Pt Will Transfer to Toilet: with min guard assist;with supervision;ambulating;grab bars (3 in 2 over toilet) Pt Will Perform Toileting - Clothing Manipulation and hygiene: with min guard assist;sit to/from stand  OT Frequency: Min 2X/week   Barriers to D/C: Decreased caregiver support                        End of Session Equipment Utilized During Treatment: Gait belt;Other (comment) (3 in 1)  Activity Tolerance: Patient tolerated treatment well Patient left: in chair;with call bell/phone within reach   Time: [id number] OT Time Calculation (min): 25 min Charges:  OT General Charges $OT Visit: 1 Procedure OT Treatments $Therapeutic Activity: 8-22 mins G-Codes:    Britt Bottom 12/30/2015, 11:36 AM

## 2015-12-30 NOTE — NC FL2 (Signed)
[place] MEDICAID FL2 LEVEL OF CARE SCREENING TOOL     IDENTIFICATION  Patient Name: Catherine Lowery Birthdate: [birth year] Sex: female Admission Date (Current Location): 12/29/2015  [facility] and [place] Number:  Herbalist and Address:  [facility] [place], [place] [address] [place], [place] [address]      Provider Number: [id number]  Attending Physician Name and Address:  Ninetta Lights, MD  Relative Name and Phone Number:       Current Level of Care: Hospital Recommended Level of Care: [place] Prior Approval Number:    Date Approved/Denied:   PASRR Number:  ([id number])  Discharge Plan: SNF    Current Diagnoses: Patient Active Problem List   Diagnosis Date Noted  . Primary localized osteoarthritis of left hip 12/29/2015  . Encounter for screening colonoscopy 02/18/2015  . 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 Hyperlipidemia 01/12/2015  . Benign essential HTN 01/12/2015  . Tobacco abuse 01/12/2015  . Atypical chest pain 01/11/2015    Orientation RESPIRATION BLADDER Height & Weight     Self, Time, Situation, Place    Continent Weight: 176 lb (79.8 kg) Height:  5\' 9"  (175.3 cm)  BEHAVIORAL SYMPTOMS/MOOD NEUROLOGICAL BOWEL NUTRITION STATUS      Continent    AMBULATORY STATUS COMMUNICATION OF NEEDS Skin   Supervision Verbally Normal                       Personal Care Assistance Level of Assistance  Bathing Bathing Assistance: Limited assistance         Functional Limitations Info             SPECIAL CARE FACTORS FREQUENCY                       Contractures      Additional Factors Info                  Current Medications (12/30/2015):  This is the current hospital active medication list Current Facility-Administered Medications  Medication Dose Route Frequency Provider Last Rate Last Dose  . 0.9 % NaCl with KCl 20 mEq/ L  infusion   Intravenous Continuous Aundra Dubin, PA-C  100 mL/hr at 12/29/15 [id number]    . acetaminophen (TYLENOL) tablet 650 mg  650 mg Oral Q6H PRN Aundra Dubin, PA-C       Or  . acetaminophen (TYLENOL) suppository 650 mg  650 mg Rectal Q6H PRN Aundra Dubin, PA-C      . alum & mag hydroxide-simeth (MAALOX/MYLANTA) 200-200-20 MG/5ML suspension 30 mL  30 mL Oral Q4H PRN Aundra Dubin, PA-C      . aspirin EC tablet 325 mg  325 mg Oral Q breakfast Aundra Dubin, PA-C   325 mg at 12/30/15 0743  . bisacodyl (DULCOLAX) EC tablet 5 mg  5 mg Oral Daily PRN Aundra Dubin, PA-C      . celecoxib (CELEBREX) capsule 200 mg  200 mg Oral Q12H Aundra Dubin, PA-C   200 mg at 12/30/15 0945  . diazepam (VALIUM) tablet 2 mg  2 mg Oral Q8H PRN Aundra Dubin, PA-C   2 mg at 12/30/15 [id number]  . diphenhydrAMINE (BENADRYL) 12.5 MG/5ML elixir 12.5-25 mg  12.5-25 mg Oral Q4H PRN Aundra Dubin, PA-C      . docusate sodium (COLACE) capsule 100 mg  100 mg Oral BID Aundra Dubin, PA-C   100  mg at 12/30/15 0942  . hydrochlorothiazide (MICROZIDE) capsule 12.5 mg  12.5 mg Oral q morning - 10a Aundra Dubin, PA-C   12.5 mg at 12/30/15 [id number]  . HYDROmorphone (DILAUDID) injection 0.5-1 mg  0.5-1 mg Intravenous Q2H PRN Aundra Dubin, PA-C      . magnesium citrate solution 1 Bottle  1 Bottle Oral Once PRN Aundra Dubin, PA-C      . menthol-cetylpyridinium (CEPACOL) lozenge 3 mg  1 lozenge Oral PRN Aundra Dubin, PA-C       Or  . phenol (CHLORASEPTIC) mouth spray 1 spray  1 spray Mouth/Throat PRN Aundra Dubin, PA-C      . metoCLOPramide (REGLAN) tablet 5-10 mg  5-10 mg Oral Q8H PRN Aundra Dubin, PA-C       Or  . metoCLOPramide (REGLAN) injection 5-10 mg  5-10 mg Intravenous Q8H PRN Aundra Dubin, PA-C      . ondansetron [facility]) tablet 4 mg  4 mg Oral Q6H PRN Aundra Dubin, PA-C       Or  . ondansetron [facility]) injection 4 mg  4 mg Intravenous Q6H PRN Aundra Dubin, PA-C      . oxyCODONE (Oxy IR/ROXICODONE) immediate release tablet 5-10 mg  5-10 mg Oral  Q3H PRN Aundra Dubin, PA-C   10 mg at 12/30/15 0943  . primidone (MYSOLINE) tablet 100 mg  100 mg Oral q morning - 10a Aundra Dubin, PA-C   100 mg at 12/30/15 [id number]  . propranolol ER (INDERAL LA) 24 hr capsule 80 mg  80 mg Oral Daily Aundra Dubin, PA-C   80 mg at 12/30/15 0943  . senna-docusate (Senokot-S) tablet 1 tablet  1 tablet Oral QHS PRN Aundra Dubin, PA-C      . simvastatin (ZOCOR) tablet 20 mg  20 mg Oral q1800 Aundra Dubin, PA-C      . zolpidem (AMBIEN) tablet 5 mg  5 mg Oral QHS PRN Aundra Dubin, PA-C         Discharge Medications: Please see discharge summary for a list of discharge medications.  Relevant Imaging Results:  Relevant Lab Results:   Additional Information  (ss: [id number])  Venetia Maxon, Lynnda Child

## 2015-12-30 NOTE — Progress Notes (Signed)
Subjective: 1 Day Post-Op Procedure(s) (LRB): TOTAL HIP ARTHROPLASTY ANTERIOR APPROACH (Left) Patient reports pain as mild.  Main complaint is itching from pre-op prep.  Otherwise, no nausea/vomiting, lightheadedness/dizziness, chest pain/sob.  Objective: Vital signs in last 24 hours: Temp:  [97.6 F (36.4 C)-98.6 F (37 C)] 98.3 F (36.8 C) (08/31 0436) Pulse Rate:  [47-68] 64 (08/31 0436) Resp:  [10-19] 17 (08/31 0436) BP: (86-145)/(46-77) 96/46 (08/31 0436) SpO2:  [98 %-100 %] 98 % (08/31 0436)  Intake/Output from previous day: 08/30 0701 - 08/31 0700 In: 2796.7 [I.V.:2796.7] Out: 400 [Blood:400] Intake/Output this shift: No intake/output data recorded.   Recent Labs  12/30/15 0312  HGB 8.4*    Recent Labs  12/30/15 0312  WBC 6.7  RBC 3.76*  HCT 26.3*  PLT 166    Recent Labs  12/30/15 0312  NA 140  K 4.7  CL 109  CO2 25  BUN 21*  CREATININE 1.15*  GLUCOSE 110*  CALCIUM 8.4*   No results for input(s): LABPT, INR in the last 72 hours.  Neurologically intact Neurovascular intact Sensation intact distally Intact pulses distally Dorsiflexion/Plantar flexion intact Incision: dressing C/D/I No cellulitis present Compartment soft  Assessment/Plan: 1 Day Post-Op Procedure(s) (LRB): TOTAL HIP ARTHROPLASTY ANTERIOR APPROACH (Left) Advance diet Up with therapy Discharge to SNF once approved by insurance WBAT LLE- anterior hip precautions ABLA-mild and stable Ok for patient to shower as bandage is waterproof   Fannie Knee 12/30/2015, 10:50 AM

## 2015-12-30 NOTE — Progress Notes (Signed)
Physical Therapy Treatment Patient Details Name: MARSHEILA TEALE MRN: [id number] DOB: [birth year] Today's Date: 12/30/2015    History of Present Illness Patient is a 70 y/o female admitted s/p L anterior THA.    PT Comments    Pt performed increased mobility progressing to gait training.  Pt is guarded due to pain but showing good progress.  Pt performed supine exercises.    Follow Up Recommendations  SNF     Equipment Recommendations       Recommendations for Other Services       Precautions / Restrictions Precautions Precautions: Fall Restrictions Weight Bearing Restrictions: Yes (LLE WBAT)    Mobility  Bed Mobility               General bed mobility comments: pt sitting on BSC on arrival.    Transfers Overall transfer level: Needs assistance Equipment used: Rolling walker (2 wheeled) Transfers: Sit to/from Stand Sit to Stand: Min assist         General transfer comment: Cues for hand placement and forward advancement of LLE.  Pt performed x2 trials.    Ambulation/Gait Ambulation/Gait assistance: Min guard Ambulation Distance (Feet): 46 Feet Assistive device: Rolling walker (2 wheeled) Gait Pattern/deviations: Step-to pattern;Trunk flexed;Antalgic;Decreased stride length   Gait velocity interpretation: Below normal speed for age/gender General Gait Details: Cues for forwaRD gaze and gait symmetry.  pt flexed in posture and required adjustment of RW.     Stairs            Wheelchair Mobility    Modified Rankin (Stroke Patients Only)       Balance Overall balance assessment: Needs assistance   Sitting balance-Leahy Scale: Good       Standing balance-Leahy Scale: Fair                      Cognition Arousal/Alertness: Lethargic Behavior During Therapy: WFL for tasks assessed/performed Overall Cognitive Status: Within Functional Limits for tasks assessed                      Exercises Total Joint Exercises Ankle  Circles/Pumps: AROM;Both;10 reps;Supine Quad Sets: AROM;Left;10 reps;Supine Short Arc Quad: AROM;Left;10 reps;Supine Heel Slides: AAROM;Left;10 reps;Supine Hip ABduction/ADduction: AAROM;Left;10 reps;Supine    General Comments        Pertinent Vitals/Pain Pain Assessment: 0-10 Pain Score: 6  Pain Location: L hip with movement.   Pain Descriptors / Indicators: Discomfort;Sore Pain Intervention(s): Monitored during session;Repositioned;Ice applied;Premedicated before session    Home Living                      Prior Function            PT Goals (current goals can now be found in the care plan section) Acute Rehab PT Goals Patient Stated Goal: To go the rehab Potential to Achieve Goals: Good Progress towards PT goals: Progressing toward goals    Frequency  7X/week    PT Plan Current plan remains appropriate    Co-evaluation             End of Session Equipment Utilized During Treatment: Gait belt Activity Tolerance: Patient tolerated treatment well Patient left: in chair;with call bell/phone within reach     Time: 0955-1010 PT Time Calculation (min) (ACUTE ONLY): 15 min  Charges:  $Gait Training: 8-22 mins                    G Codes:  Isiaha Greenup Eli Hose 12/30/2015, 10:17 AM  Governor Rooks, PTA pager [phone]

## 2015-12-30 NOTE — Progress Notes (Signed)
Physical Therapy Treatment Patient Details Name: Catherine Lowery MRN: [id number] DOB: 1946-02-13 Today's Date: 12/30/2015    History of Present Illness Patient is a 69 y/o female admitted s/p L anterior THA.    PT Comments    Pt progressed gait pattern during second session (improved distance and progressed to step through sequencing).  Pt in less pain and performed therapeutic exercise to continue to strengthen muscles involved.  Pt should d/c to [facility] today and would continue to benefit from short term stay to improve strength and functional mobility before returning home.    Follow Up Recommendations  SNF     Equipment Recommendations       Recommendations for Other Services       Precautions / Restrictions Precautions Precautions: Fall Restrictions Weight Bearing Restrictions: Yes Other Position/Activity Restrictions: L WBAT    Mobility  Bed Mobility Overal bed mobility: Needs Assistance Bed Mobility: Supine to Sit;Sit to Supine     Supine to sit: Supervision Sit to supine: Min assist   General bed mobility comments: Pt required assist to lift LLE into bed against gravity due to pain.    Transfers Overall transfer level: Needs assistance Equipment used: Rolling walker (2 wheeled) Transfers: Sit to/from Stand Sit to Stand: Min guard         General transfer comment: Cues for hand placement to and from seated surface.  Cues for forward advancement of LLE.  Pt stood from bed and commode.    Ambulation/Gait Ambulation/Gait assistance: Min guard Ambulation Distance (Feet): 120 Feet Assistive device: Rolling walker (2 wheeled) Gait Pattern/deviations: Step-through pattern;Trunk flexed;Antalgic;Decreased stride length   Gait velocity interpretation: Below normal speed for age/gender General Gait Details: Pt required cues for progression of step to to step through sequencing to normalize gait pattern.  Pt required cues for scpaular retraction and increasing B  stride length.     Stairs            Wheelchair Mobility    Modified Rankin (Stroke Patients Only)       Balance Overall balance assessment: Needs assistance Sitting-balance support: No upper extremity supported;Feet supported Sitting balance-Leahy Scale: Good     Standing balance support: During functional activity Standing balance-Leahy Scale: Fair                      Cognition Arousal/Alertness: Awake/alert Behavior During Therapy: WFL for tasks assessed/performed Overall Cognitive Status: Within Functional Limits for tasks assessed                      Exercises Total Joint Exercises Ankle Circles/Pumps: AROM;Both;10 reps;Supine Quad Sets: AROM;Left;10 reps;Supine Short Arc Quad: AROM;Left;10 reps;Supine Heel Slides: AAROM;Left;10 reps;Supine Hip ABduction/ADduction: AAROM;Left;10 reps;Supine    General Comments        Pertinent Vitals/Pain Pain Assessment: 0-10 Pain Score: 4  Pain Location: LLE Pain Descriptors / Indicators: Sore Pain Intervention(s): Monitored during session;Repositioned    Home Living Family/patient expects to be discharged to:: Assisted living Living Arrangements: Children             Additional Comments: lives with son who is disabled in one level home with 4 steps or ramp to enter    Prior Function Level of Independence: Independent          PT Goals (current goals can now be found in the care plan section) Acute Rehab PT Goals Patient Stated Goal: To go the rehab Potential to Achieve Goals: Good Progress towards PT  goals: Progressing toward goals    Frequency  7X/week    PT Plan Current plan remains appropriate    Co-evaluation             End of Session Equipment Utilized During Treatment: Gait belt Activity Tolerance: Patient tolerated treatment well Patient left: in bed;with call bell/phone within reach     Time: 1358-1415 PT Time Calculation (min) (ACUTE ONLY): 17  min  Charges:  $Gait Training: 8-22 mins                    G Codes:      Catherine Lowery 01/28/16, 2:21 PM  Catherine Lowery, PTA pager [phone]

## 2015-12-30 NOTE — Op Note (Signed)
NAMEMICHELEE, RUFFING NO.:  [id number]  MEDICAL RECORD NO.:  [id number]  LOCATION:  5N14C                        FACILITY:  [facility]  PHYSICIAN:  Ninetta Lights, M.D. DATE OF BIRTH:  [birth year]  DATE OF PROCEDURE:  12/29/2015 DATE OF DISCHARGE:                              OPERATIVE REPORT   PREOPERATIVE DIAGNOSIS:  Left hip end-stage arthritis, primary generalized.  POSTOPERATIVE DIAGNOSIS:  Left hip end-stage arthritis, primary generalized.  PROCEDURE:  Direct anterior left total hip replacement utilizing Stryker prosthesis.  A press-fit 52 mm acetabular component screw fixation x2. A 36 mm internal diameter.  Press-fit #2 Accolade stem with a -5 x 36 Biolox head.  SURGEON-Zalman Hull:  Ninetta Lights, M.D.  ASSISTANT:  Elmyra Ricks, PA, present throughout the entire case and necessary for timely completion of procedure.  ANESTHESIA:  General.  BLOOD LOSS:  300 mL.  BLOOD GIVEN:  None.  SPECIMENS:  None.  CULTURES:  None.  COMPLICATIONS:  None.  DRESSINGS:  Soft compressive.  DESCRIPTION OF PROCEDURE:  The patient was brought to the operating room and after adequate anesthesia had been obtained, placed on the Hana bed, prepped and draped in usual sterile fashion.  Fluoroscopic guidance utilized throughout.  Direct anterior approach over the tensor.  Skin and subcutaneous tissue divided.  Hemostasis with cautery.  Tensor elevated.  Deep fascia incised.  Anterior joint capsule exposed and excised.  Retractors put in place.  Femoral neck cut 1 fingerbreadth above the lesser trochanter.  A napkin ring and then the head was cut and both removed.  Acetabulum exposed.  Marked spurring around the acetabulum removed.  Brought up to good bleeding bone.  Fitted with a 52 mm cup after appropriate reaming.  Confirming good position fluoroscopically.  Hammered in place, fixed with 2 screws through the cup, a 36 mm internal diameter liner.  Traction on  the femur.  I then prepared the femur and fitted with a #2 stem.  After appropriate trials, #2 stem with a 36- 5 Biolox head.  This gave great stability, equal leg lengths.  Wound irrigated and injected with Exparel.  Fascia closed with Vicryl. Subcutaneous and subcuticular closure.  Margins were injected with Marcaine.  Sterile compressive dressing applied.  Returned to hospital bed.  Anesthesia reversed.  Brought to the recovery room.  Tolerated the surgery well.  No complications.     Ninetta Lights, M.D.     DFM/MEDQ  D:  12/29/2015  T:  12/30/2015  Job:  [id number]

## 2015-12-31 LAB — BASIC METABOLIC PANEL
ANION GAP: 8 (ref 5–15)
BUN: 20 mg/dL (ref 6–20)
CO2: 26 mmol/L (ref 22–32)
Calcium: 8.7 mg/dL — ABNORMAL LOW (ref 8.9–10.3)
Chloride: 105 mmol/L (ref 101–111)
Creatinine, Ser: 1.16 mg/dL — ABNORMAL HIGH (ref 0.44–1.00)
GFR calc Af Amer: 54 mL/min — ABNORMAL LOW (ref >60)
GFR, EST NON AFRICAN AMERICAN: 46 mL/min — AB (ref >60)
GLUCOSE: 110 mg/dL — AB (ref 65–99)
POTASSIUM: 4 mmol/L (ref 3.5–5.1)
Sodium: 139 mmol/L (ref 135–145)

## 2015-12-31 LAB — CBC
HEMATOCRIT: 22.9 % — AB (ref 36.0–46.0)
HEMATOCRIT: 31 % — AB (ref 36.0–46.0)
HEMOGLOBIN: 7.3 g/dL — AB (ref 12.0–15.0)
HEMOGLOBIN: 10 g/dL — AB (ref 12.0–15.0)
MCH: 22.1 pg — ABNORMAL LOW (ref 26.0–34.0)
MCH: 23.9 pg — ABNORMAL LOW (ref 26.0–34.0)
MCHC: 31.9 g/dL (ref 30.0–36.0)
MCHC: 32.3 g/dL (ref 30.0–36.0)
MCV: 69.2 fL — AB (ref 78.0–100.0)
MCV: 74 fL — AB (ref 78.0–100.0)
PLATELETS: 140 10*3/uL — AB (ref 150–400)
Platelets: 140 10*3/uL — ABNORMAL LOW (ref 150–400)
RBC: 3.31 MIL/uL — AB (ref 3.87–5.11)
RBC: 4.19 MIL/uL (ref 3.87–5.11)
RDW: 15.9 % — ABNORMAL HIGH (ref 11.5–15.5)
RDW: 17.8 % — AB (ref 11.5–15.5)
WBC: 8.8 10*3/uL (ref 4.0–10.5)
WBC: 9.1 10*3/uL (ref 4.0–10.5)

## 2015-12-31 LAB — PREPARE RBC (CROSSMATCH)

## 2015-12-31 MED ORDER — FUROSEMIDE 10 MG/ML IJ SOLN
20.0000 mg | Freq: Once | INTRAMUSCULAR | Status: AC
  Administered 2015-12-31: 20 mg via INTRAVENOUS
  Filled 2015-12-31: qty 2

## 2015-12-31 MED ORDER — SODIUM CHLORIDE 0.9 % IV SOLN
Freq: Once | INTRAVENOUS | Status: AC
  Administered 2015-12-31: 10:00:00 via INTRAVENOUS

## 2015-12-31 NOTE — Progress Notes (Signed)
Spoke with Lanny Hurst and Mendel Ryder regarding Ms [facility] Hemoglobin of 10 and both stated ok for discharge.  [place] report given to ola and [facility] called for pick-up, IV d/c

## 2015-12-31 NOTE — Progress Notes (Signed)
Physical Therapy Treatment Patient Details Name: Catherine Lowery MRN: [id number] DOB: 1945-12-18 Today's Date: 12/31/2015    History of Present Illness Patient is a 70 y/o female admitted s/p L anterior THA.    PT Comments    Pt performed increased gait and remains to require cues for safety.  Pt continues to benefit from skilled rehab to improve strength, balance and functional mobility before returning home.    Follow Up Recommendations  SNF     Equipment Recommendations  Rolling walker with 5" wheels (TBD at next venue.  )    Recommendations for Other Services       Precautions / Restrictions Precautions Precautions: Fall Restrictions Weight Bearing Restrictions: Yes Other Position/Activity Restrictions: L WBAT    Mobility  Bed Mobility               General bed mobility comments: Pt sitting up in recliner on arrival.    Transfers Overall transfer level: Needs assistance Equipment used: Rolling walker (2 wheeled) Transfers: Sit to/from Stand Sit to Stand: Min guard Stand pivot transfers: Min guard       General transfer comment: Cues for safety with lines and leads and hand/foot placement.  Ambulation/Gait Ambulation/Gait assistance: Min guard Ambulation Distance (Feet): 180 Feet Assistive device: Rolling walker (2 wheeled) Gait Pattern/deviations: Step-through pattern;Trunk flexed;Decreased stride length;Step-to pattern   Gait velocity interpretation: Below normal speed for age/gender General Gait Details: Pt requires constant cueing to maintain step through sequencing and improve gait pattern and gait symmetry.  Pt requires cues for safety to keep hands on RW.  Cues for upper trunk control and scapular retraction.     Stairs            Wheelchair Mobility    Modified Rankin (Stroke Patients Only)       Balance     Sitting balance-Leahy Scale: Good       Standing balance-Leahy Scale: Fair                      Cognition  Arousal/Alertness: Awake/alert Behavior During Therapy: WFL for tasks assessed/performed Overall Cognitive Status: Within Functional Limits for tasks assessed                      Exercises Total Joint Exercises Ankle Circles/Pumps: AROM;Both;10 reps;Supine Quad Sets: AROM;Left;10 reps;Supine Short Arc Quad: AROM;Left;10 reps;Supine Heel Slides: AAROM;Left;10 reps;Supine Hip ABduction/ADduction: AAROM;Left;10 reps;Supine Long Arc Quad: AROM;Seated;10 reps;Left    General Comments        Pertinent Vitals/Pain Pain Assessment: 0-10 Pain Score: 4  Pain Location: LLE Pain Intervention(s): Monitored during session    Home Living                      Prior Function            PT Goals (current goals can now be found in the care plan section) Acute Rehab PT Goals Patient Stated Goal: To go the rehab Potential to Achieve Goals: Good Progress towards PT goals: Progressing toward goals    Frequency  7X/week    PT Plan Current plan remains appropriate    Co-evaluation             End of Session Equipment Utilized During Treatment: Gait belt Activity Tolerance: Patient tolerated treatment well Patient left: in chair;with call bell/phone within reach;with family/visitor present     Time: [id number] PT Time Calculation (min) (ACUTE ONLY): 18 min  Charges:  $  Gait Training: 8-22 mins                    G Codes:      Cristela Blue 01/17/2016, 1:01 PM  Governor Rooks, PTA pager [phone]

## 2015-12-31 NOTE — Clinical Social Work Placement (Signed)
   CLINICAL SOCIAL WORK PLACEMENT  NOTE  Date:  12/31/2015  Patient Details  Name: CARMELINA VITERI MRN: [id number] Date of Birth: Jul 23, 1945  Clinical Social Work is seeking post-discharge placement for this patient at the [place] level of care (*CSW will initial, date and re-position this form in  chart as items are completed):  Yes   Patient/family provided with [place] Work Department's list of facilities offering this level of care within the geographic area requested by the patient (or if unable, by the patient's family).  Yes   Patient/family informed of their freedom to choose among providers that offer the needed level of care, that participate in Medicare, Medicaid or managed care program needed by the patient, have an available bed and are willing to accept the patient.  Yes   Patient/family informed of [facility]'s ownership interest in [facility] and [facility], as well as of the fact that they are under no obligation to receive care at these facilities.  PASRR submitted to EDS on       PASRR number received on       Existing PASRR number confirmed on       FL2 transmitted to all facilities in geographic area requested by pt/family on       FL2 transmitted to all facilities within larger geographic area on       Patient informed that his/her managed care company has contracts with or will negotiate with certain facilities, including the following:        Yes   Patient/family informed of bed offers received.  Patient chooses bed at  [facility])     Physician recommends and patient chooses bed at      Patient to be transferred to   on 12/31/15.  Patient to be transferred to facility by  [facility])     Patient family notified on 12/31/15 of transfer.  Name of family member notified:        PHYSICIAN       Additional Comment:    _______________________________________________ Tilda Burrow R 12/31/2015,  12:04 PM

## 2015-12-31 NOTE — Care Management Important Message (Signed)
Important Message  Patient Details  Name: Catherine Lowery MRN: [id number] Date of Birth: 1946/03/05   Medicare Important Message Given:  Yes    Orbie Pyo 12/31/2015, 10:43 AM

## 2015-12-31 NOTE — Progress Notes (Signed)
Discharge instructions given to EMS. All questions answered. Vital signs stable. Family at bedside at time of discharge.

## 2015-12-31 NOTE — Progress Notes (Signed)
Subjective: 2 Days Post-Op Procedure(s) (LRB): TOTAL HIP ARTHROPLASTY ANTERIOR APPROACH (Left) Patient reports pain as mild.  No nausea/vomiting, lightheadedness/dizziness, chest pain/sob.    Objective: Vital signs in last 24 hours: Temp:  [98.1 F (36.7 C)-98.6 F (37 C)] 98.6 F (37 C) (09/01 0541) Pulse Rate:  [68-72] 68 (09/01 0541) Resp:  [16-18] 16 (09/01 0541) BP: (101-122)/(52-55) 101/52 (09/01 0541) SpO2:  [97 %-99 %] 97 % (09/01 0541)  Intake/Output from previous day: 08/31 0701 - 09/01 0700 In: 470 [P.O.:470] Out: -  Intake/Output this shift: No intake/output data recorded.   Recent Labs  12/30/15 0312 12/31/15 0426  HGB 8.4* 7.3*    Recent Labs  12/30/15 0312 12/31/15 0426  WBC 6.7 8.8  RBC 3.76* 3.31*  HCT 26.3* 22.9*  PLT 166 140*    Recent Labs  12/30/15 0312 12/31/15 0426  NA 140 139  K 4.7 4.0  CL 109 105  CO2 25 26  BUN 21* 20  CREATININE 1.15* 1.16*  GLUCOSE 110* 110*  CALCIUM 8.4* 8.7*   No results for input(s): LABPT, INR in the last 72 hours.  Neurologically intact Neurovascular intact Sensation intact distally Intact pulses distally Dorsiflexion/Plantar flexion intact Incision: dressing C/D/I No cellulitis present Compartment soft  Assessment/Plan: 2 Days Post-Op Procedure(s) (LRB): TOTAL HIP ARTHROPLASTY ANTERIOR APPROACH (Left) Advance diet Up with therapy Discharge to SNF once approved by insurance, but must get blood transfusion first WBAT LLE-anterior hip precautions ABLA-stable but will transfuse with 2units prbc today  Fannie Knee 12/31/2015, 8:39 AM

## 2015-12-31 NOTE — Progress Notes (Signed)
1st unit of packed cells currently infusing. No untoward reaction noted. Endorsed to [employer] for follow up

## 2015-12-31 NOTE — Progress Notes (Signed)
Patient for blood transfusion. Consent signed by the patient. Education on possible transfusion reaction/s explained to the patient. She verbalized understanding.

## 2015-12-31 NOTE — Progress Notes (Signed)
SW has sent the requested information to pt's insurance in order to obtain an authorization number. SW is awaiting a call back with number. Patient was offered a bed at [facility]. Patient has accepted the offer. SW made facility aware the pt has accepted the offer.  Tilda Burrow, MSW [phone]

## 2016-01-01 LAB — TYPE AND SCREEN
ABO/RH(D): O POS
ANTIBODY SCREEN: NEGATIVE
UNIT DIVISION: 0
Unit division: 0

## 2016-01-04 ENCOUNTER — Non-Acute Institutional Stay (SKILLED_NURSING_FACILITY): Payer: Medicare HMO | Admitting: Internal Medicine

## 2016-01-04 ENCOUNTER — Encounter: Payer: Self-pay | Admitting: Internal Medicine

## 2016-01-04 DIAGNOSIS — G250 Essential tremor: Secondary | ICD-10-CM | POA: Diagnosis not present

## 2016-01-04 DIAGNOSIS — I10 Essential (primary) hypertension: Secondary | ICD-10-CM

## 2016-01-04 DIAGNOSIS — D62 Acute posthemorrhagic anemia: Secondary | ICD-10-CM

## 2016-01-04 DIAGNOSIS — M1612 Unilateral primary osteoarthritis, left hip: Secondary | ICD-10-CM

## 2016-01-04 DIAGNOSIS — R2681 Unsteadiness on feet: Secondary | ICD-10-CM

## 2016-01-04 DIAGNOSIS — D696 Thrombocytopenia, unspecified: Secondary | ICD-10-CM

## 2016-01-04 NOTE — Progress Notes (Signed)
LOCATION: [facility]  PCP: Tereasa Coop, PA-C   Code Status: DNR  Goals of care: Advanced Directive information Advanced Directives 01/04/2016  Does patient have an advance directive? Yes  Type of Advance Directive Out of facility DNR (pink MOST or yellow form)  Does patient want to make changes to advanced directive? No - Patient declined  Copy of advanced directive(s) in chart? Yes  Would patient like information on creating an advanced directive? -       Extended Emergency Contact Information Primary Emergency Contact: Milton,Louise Address: [id number]          [place], [place] [address] [place] Phone: [phone] Mobile Phone: [phone] Relation: Sister   Allergies  Allergen Reactions  . Morphine And Related Itching  . Other Itching and Nausea And Vomiting    "PAIN MEDICINES"  . Percocet [Oxycodone-Acetaminophen] Nausea And Vomiting    Chief Complaint  Patient presents with  . New Admit To SNF    New Admission     HPI:  Patient is a 70 y.o. female seen today for short term rehabilitation post hospital admission from 12/29/15-12/31/15 with left hip OA. She underwent left total hip arthroplasty. She is seen in her room today. Her pain medication has been helping her.   Review of Systems:  Constitutional: Negative for fever, chills, diaphoresis. Energy level is slowly coming back. HENT: Negative for headache, congestion, nasal discharge Eyes: Negative for blurred vision, double vision and discharge.  Respiratory: Negative for cough, shortness of breath and wheezing.   Cardiovascular: Negative for chest pain, palpitations, leg swelling.  Gastrointestinal: Negative for heartburn, nausea, vomiting, abdominal pain,. Last bowel movement was yesterday. Genitourinary: Negative for dysuria and flank pain.  Musculoskeletal: Negative for back pain, fall in the facility.  Skin: Negative for itching, rash.  Neurological: Negative for  dizziness. Psychiatric/Behavioral: Negative for depression   Past Medical History:  Diagnosis Date  . DJD (degenerative joint disease)    LEFT HIP  . Hyperlipidemia    takes Simvasatin daily  . Hypertension    takes Propranolol and HCTZ daioly  . Hyperthyroidism   . Pneumonia 10/2015  . PONV (postoperative nausea and vomiting)   . Shortness of breath dyspnea    with ambulation  . Tremors of nervous system    takes Primidone daily   Past Surgical History:  Procedure Laterality Date  . BOne Spur    . COLONOSCOPY    . JOINT REPLACEMENT    . KNEE SURGERY    . ROTATOR CUFF REPAIR Bilateral   . 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 TOTAL HIP ARTHROPLASTY Left 12/29/2015   Procedure: TOTAL HIP ARTHROPLASTY ANTERIOR APPROACH;  Surgeon: Ninetta Lights, MD;  Location: [facility];  Service: Orthopedics;  Laterality: Left;   Social History:   reports that she has been smoking.  She has a 10.00 pack-year smoking history. She has never used smokeless tobacco. She reports that she does not drink alcohol or use drugs.  Family History  Problem Relation Age of Onset  . Hypertension Mother   . Heart disease Mother   . Cancer Father     Medications:   Medication List       Accurate as of 01/04/16 12:50 PM. Always use your most recent med list.          aspirin 325 MG tablet Take 325 mg by mouth daily. Stop date 01/30/16   hydrochlorothiazide 12.5 MG capsule Commonly known as:  MICROZIDE Take  12.5 mg by mouth every morning.   ondansetron 4 MG tablet Commonly known as:  ZOFRAN Take 1 tablet (4 mg total) by mouth every 8 (eight) hours as needed for nausea or vomiting.   oxyCODONE-acetaminophen 5-325 MG tablet Commonly known as:  PERCOCET Take 1-2 tablets by mouth every 4 (four) hours as needed for severe pain.   primidone 50 MG tablet Commonly known as:  MYSOLINE Take 100 mg by mouth every morning.   propranolol ER 80 MG 24 hr capsule Commonly known as:  INDERAL LA Take  80 mg by mouth daily.   simvastatin 20 MG tablet Commonly known as:  ZOCOR TAKE 1 TABLET EVERY DAY IN THE MORNING   Vitamin D (Ergocalciferol) 50000 units Caps capsule Commonly known as:  DRISDOL Take 50,000 Units by mouth every 7 (seven) days. sundays       Immunizations: Immunization History  Administered Date(s) Administered  . PPD Test 12/31/2015     Physical Exam:  Vitals:   01/04/16 1234  BP: 110/70  Pulse: 68  Resp: 20  Temp: 98.5 F (36.9 C)  TempSrc: Oral  Weight: 176 lb (79.8 kg)  Height: 5\' 9"  (1.753 m)   Body mass index is 25.99 kg/m.  General- elderly female, well built, in no acute distress Head- normocephalic, atraumatic Nose- no maxillary or frontal sinus tenderness, no nasal discharge Throat- moist mucus membrane Eyes- PERRLA, EOMI, no pallor, no icterus, no discharge, normal conjunctiva, normal sclera Neck- no cervical lymphadenopathy Cardiovascular- normal s1,s2, no murmur, trace left leg edema Respiratory- bilateral clear to auscultation, no wheeze, no rhonchi, no crackles, no use of accessory muscles Abdomen- bowel sounds present, soft, non tender Musculoskeletal- able to move all 4 extremities, limited left hip ROM, arthritis changes to her fingers Neurological-  alert and oriented to person, place and time, essential tremors present Skin- warm and dry, left hip surgical incision with dressing in place, skin tear on sternal wall area without signs of infection, no drainage noted Psychiatry- normal mood and affect    Labs reviewed: Basic Metabolic Panel:  Recent Labs  12/17/15 1318 12/30/15 0312 12/31/15 0426  NA 139 140 139  K 4.2 4.7 4.0  CL 107 109 105  CO2 25 25 26   GLUCOSE 89 110* 110*  BUN 30* 21* 20  CREATININE 1.50* 1.15* 1.16*  CALCIUM 9.5 8.4* 8.7*   Liver Function Tests: No results for input(s): AST, ALT, ALKPHOS, BILITOT, PROT, ALBUMIN in the last 8760 hours. No results for input(s): LIPASE, AMYLASE in the last  8760 hours. No results for input(s): AMMONIA in the last 8760 hours. CBC:  Recent Labs  10/30/15 1818  12/30/15 0312 12/31/15 0426 12/31/15 1638  WBC 6.9  < > 6.7 8.8 9.1  NEUTROABS 4.9  --   --   --   --   HGB 12.0  < > 8.4* 7.3* 10.0*  HCT 36.4  < > 26.3* 22.9* 31.0*  MCV 69.3*  < > 69.9* 69.2* 74.0*  PLT 136*  < > 166 140* 140*  < > = values in this interval not displayed. Cardiac Enzymes:  Recent Labs  01/11/15 2250 01/12/15 0359 01/12/15 1420  TROPONINI <0.03 <0.03 <0.03   BNP: Invalid input(s): POCBNP CBG: No results for input(s): GLUCAP in the last 8760 hours.  Radiological Exams: US Soft Tissue Head/neck  Result Date: 12/09/2015 CLINICAL DATA:  Family history of thyroid cancer. Thyrotoxicosis without storm. Heterogeneous uptake throughout both lobes noted by nuclear medicine thyroid imaging. EXAM: THYROID ULTRASOUND  TECHNIQUE: Ultrasound examination of the thyroid gland and adjacent soft tissues was performed. COMPARISON:  None. FINDINGS: Right thyroid lobe Measurements: 5.9 x 2.0 x 2.4 cm. Multiple nodules are scattered throughout the right lobe. 1.3 x 0.7 x 0.9 cm mid lobe solid nodule. 1.7 x 1.3 x 0.8 cm upper pole solid nodule. 1.9 x 0.9 x 1.2 cm lower pole nodule. Other scattered smaller nodules are noted. Left thyroid lobe Measurements: 5.6 x 1.5 x 2.0 cm. Multiple solid nodules are scattered throughout the left lobe. 1.0 x 1.1 x 1.4 cm mid lobe nodule. 2.1 x 0.9 x 1.2 cm lower pole nodule. 1.4 x 1.0 x 1.1 cm upper pole nodule. Other smaller nodules are noted. Isthmus Thickness: 0.4 cm.  Small subcentimeter nodules are present. Lymphadenopathy None visualized. IMPRESSION: Multiple bilateral nodules are seen throughout the thyroid gland. The largest on left measures 2.1 cm and the largest on the right measures 1.9 cm. Findings meet consensus criteria for biopsy. Ultrasound-guided fine needle aspiration should be considered, as per the consensus statement: Management of  Thyroid Nodules Detected at [place]: Society of Radiologists in [place]. Radiology 2005; [id number]. Electronically Signed   By: Marybelle Killings M.D.   On: 12/09/2015 16:31   Dg C-arm 1-60 Min  Result Date: 12/29/2015 CLINICAL DATA:  Status post total hip replaced EXAM: DG C-ARM 61-120 MIN; OPERATIVE LEFT HIP WITH PELVIS COMPARISON:  None. FLUOROSCOPY TIME:  0 minutes 26 seconds; 2 acquired image FINDINGS: Frontal view obtained. There is a total hip prosthesis on the left with prosthetic components appearing well-seated on single view. No acute fracture or dislocation. IMPRESSION: Prosthetic components appear well seated on single view. No acute fracture or dislocation evident. Electronically Signed   By: Lowella Grip III M.D.   On: 12/29/2015 10:58   Dg Hip Operative Unilat W Or W/o Pelvis Left  Result Date: 12/29/2015 CLINICAL DATA:  Status post total hip replaced EXAM: DG C-ARM 61-120 MIN; OPERATIVE LEFT HIP WITH PELVIS COMPARISON:  None. FLUOROSCOPY TIME:  0 minutes 26 seconds; 2 acquired image FINDINGS: Frontal view obtained. There is a total hip prosthesis on the left with prosthetic components appearing well-seated on single view. No acute fracture or dislocation. IMPRESSION: Prosthetic components appear well seated on single view. No acute fracture or dislocation evident. Electronically Signed   By: Lowella Grip III M.D.   On: 12/29/2015 10:58    Assessment/Plan  Unsteady gait Will have patient work with PT/OT as tolerated to regain strength and restore function.  Fall precautions are in place.  Left hip OA S/p total left hip arthroplasty. Has orthopedic follow up. Will have her work with physical therapy and occupational therapy team to help with gait training and muscle strengthening exercises.fall precautions. Skin care. Encourage to be out of bed. Continue percocet 5-325 mg 1-2 tab q4h prn pain. Continue aspirin 325 mg daily for dvt prophylaxis. Add  ted hose to help with leg edema  Blood loss anemia Post op, monitor cbc  Thrombocytopenia Denies bleed, monitor platelet count  Essential tremors Continue propranolol and mysoline for now and monitor  HTN Check bp, continue hctz 12.5 mg daily, check bmp    Goals of care: short term rehabilitation   Labs/tests ordered: cbc, cmp 01/06/16  Family/ staff Communication: reviewed care plan with patient and nursing supervisor    Blanchie Serve, MD Internal Medicine [facility] [place], [place] [address] Cell Phone (Monday-Friday 8 am - 5 pm):  [phone] On Call: [phone] and follow prompts after 5 pm and on weekends Office Phone: [phone] Office Fax: [phone]

## 2016-01-06 ENCOUNTER — Other Ambulatory Visit: Payer: Self-pay

## 2016-01-06 MED ORDER — OXYCODONE-ACETAMINOPHEN 5-325 MG PO TABS: 1.0000 | ORAL_TABLET | ORAL | 0 refills | Status: DC | PRN

## 2016-01-06 NOTE — Telephone Encounter (Signed)
Rx faxed to [facility] @ [phone], phone number [phone]  

## 2016-01-13 ENCOUNTER — Non-Acute Institutional Stay (SKILLED_NURSING_FACILITY): Payer: Medicare HMO | Admitting: Family

## 2016-01-13 ENCOUNTER — Encounter: Payer: Self-pay | Admitting: Family

## 2016-01-13 DIAGNOSIS — I10 Essential (primary) hypertension: Secondary | ICD-10-CM | POA: Diagnosis not present

## 2016-01-13 DIAGNOSIS — E782 Mixed hyperlipidemia: Secondary | ICD-10-CM

## 2016-01-13 DIAGNOSIS — R2681 Unsteadiness on feet: Secondary | ICD-10-CM | POA: Diagnosis not present

## 2016-01-13 DIAGNOSIS — E559 Vitamin D deficiency, unspecified: Secondary | ICD-10-CM | POA: Diagnosis not present

## 2016-01-13 DIAGNOSIS — R251 Tremor, unspecified: Secondary | ICD-10-CM | POA: Diagnosis not present

## 2016-01-13 NOTE — Progress Notes (Signed)
Patient ID: [facility], female   DOB: 1946-04-07, 70 y.o.   MRN: [id number]  Location:   [facility] Room Number: 907 P Place of Service:  SNF (31)  Provider: Sandrea Hughs, FNP-C   PCP: [facility] Patient Care Team: Blair Heys, PA-C as PCP - General (Physician Assistant)  Extended Emergency Contact Information Primary Emergency Contact: Milton,Louise Address: [id number]          [place], [place] [address] [place] Phone: [phone] Mobile Phone: [phone] Relation: Sister  Code Status: DNR  Goals of care:  Advanced Directive information Advanced Directives 01/13/2016  Does patient have an advance directive? Yes  Type of Advance Directive Out of facility DNR (pink MOST or yellow form)  Does patient want to make changes to advanced directive? No - Patient declined  Copy of advanced directive(s) in chart? Yes  Would patient like information on creating an advanced directive? -     Allergies  Allergen Reactions  . Morphine And Related Itching  . Other Itching and Nausea And Vomiting    "PAIN MEDICINES"  . Percocet [Oxycodone-Acetaminophen] Nausea And Vomiting    Chief Complaint  Patient presents with  . Discharge Note    HPI:  70 y.o. female seen at [facility] for discharge home. She was here for short term rehabilitation post hospital admission from 12/29/15-12/31/15 with left hip OA. She underwent left total hip arthroplasty. She has a medical history of HTN, hyperlipidemia, Hyperthyroidism, DJD, Tremors among other conditions. She is seen in her room today. She states left hip pain under control with current pain regimen.Her stay here in rehab was unremarkable. She has worked well with PT/OT now stable for discharge home.She will be discharged home with Home health PT  to continue with ROM, Exercise, Gait stability and muscle strengthening. She does not require any DME. Home  health services will be arranged by facility social worker prior to discharge.She will be discharged with her medication from the facility.Prescription medication will be written x 1 month then patient to follow up with PCP in 1-2 weeks. Facility staff report no new concerns.      Past Medical History:  Diagnosis Date  . DJD (degenerative joint disease)    LEFT HIP  . Hyperlipidemia    takes Simvasatin daily  . Hypertension    takes Propranolol and HCTZ daioly  . Hyperthyroidism   . Pneumonia 10/2015  . PONV (postoperative nausea and vomiting)   . Shortness of breath dyspnea    with ambulation  . Tremors of nervous system    takes Primidone daily    Past Surgical History:  Procedure Laterality Date  . BOne Spur    . COLONOSCOPY    . JOINT REPLACEMENT    . KNEE SURGERY    . ROTATOR CUFF REPAIR Bilateral   . 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 TOTAL HIP ARTHROPLASTY Left 12/29/2015   Procedure: TOTAL HIP ARTHROPLASTY ANTERIOR APPROACH;  Surgeon: Ninetta Lights, MD;  Location: [facility];  Service: Orthopedics;  Laterality: Left;      reports that she has been smoking.  She has a 10.00 pack-year smoking history. She has never used smokeless tobacco. She reports that she does not drink alcohol or use drugs. Social History   Social History  . Marital status: Married    Spouse name: N/A  . Number of children: N/A  . Years of education: N/A  Occupational History  . Not on file.   Social History Main Topics  . Smoking status: Current Every Day Smoker    Packs/day: 0.50    Years: 20.00  . Smokeless tobacco: Never Used     Comment: Pt stated "I'm trying to quit"  . Alcohol use No  . Drug use: No  . Sexual activity: Not on file   Other Topics Concern  . Not on file   Social History Narrative  . No narrative on file    Allergies  Allergen Reactions  . Morphine And Related Itching  . Other Itching and Nausea And Vomiting    "PAIN MEDICINES"  . Percocet  [Oxycodone-Acetaminophen] Nausea And Vomiting    Pertinent  Health Maintenance Due  Topic Date Due  . MAMMOGRAM  03/12/1994  . DEXA SCAN  03/12/2009  . PNA vac Low Risk Adult (1 of 2 - PCV13) 03/12/2009  . INFLUENZA VACCINE  11/30/2015  . COLONOSCOPY  02/25/2025    Medications:   Medication List       Accurate as of 01/13/16 10:46 AM. Always use your most recent med list.          aspirin 325 MG tablet Take 325 mg by mouth daily. Stop date 01/30/16   hydrochlorothiazide 12.5 MG capsule Commonly known as:  MICROZIDE Take 12.5 mg by mouth every morning.   ondansetron 4 MG tablet Commonly known as:  ZOFRAN Take 1 tablet (4 mg total) by mouth every 8 (eight) hours as needed for nausea or vomiting.   oxyCODONE-acetaminophen 5-325 MG tablet Commonly known as:  PERCOCET Take 1 tablet by mouth every 4 (four) hours as needed for moderate pain. DO NOT EXCEED 4GM OF APAP IN 24 HOURS FROM ALL SOURCES   primidone 50 MG tablet Commonly known as:  MYSOLINE Take 100 mg by mouth every morning.   propranolol ER 80 MG 24 hr capsule Commonly known as:  INDERAL LA Take 80 mg by mouth daily.   simvastatin 20 MG tablet Commonly known as:  ZOCOR TAKE 1 TABLET EVERY DAY IN THE MORNING   Vitamin D (Ergocalciferol) 50000 units Caps capsule Commonly known as:  DRISDOL Take 50,000 Units by mouth every 7 (seven) days. sundays       Review of Systems  Constitutional: Negative for activity change, appetite change, chills, fatigue and fever.  HENT: Negative for congestion, rhinorrhea, sinus pain, sinus pressure, sneezing and sore throat.   Eyes: Negative.   Respiratory: Negative for cough, chest tightness, shortness of breath and wheezing.   Cardiovascular: Negative for chest pain, palpitations and leg swelling.  Gastrointestinal: Negative for abdominal distention, abdominal pain, constipation, diarrhea, nausea and vomiting.  Endocrine: Negative for cold intolerance, heat intolerance,  polydipsia, polyphagia and polyuria.  Genitourinary: Negative for dysuria, flank pain, frequency and urgency.  Musculoskeletal: Positive for gait problem.       Left hip limited ROM due to pain   Skin: Negative for color change, pallor and rash.  Neurological: Positive for tremors. Negative for dizziness, seizures, syncope, light-headedness and headaches.  Hematological: Does not bruise/bleed easily.  Psychiatric/Behavioral: Negative for agitation, confusion, hallucinations and sleep disturbance. The patient is not nervous/anxious.     Vitals:   01/13/16 1044  BP: (!) 142/62  Pulse: 73  Resp: 19  Temp: 98.1 F (36.7 C)  TempSrc: Oral  SpO2: 99%  Weight: 179 lb 6.4 oz (81.4 kg)   Body mass index is 26.49 kg/m. Physical Exam  Constitutional: She is oriented to person,  place, and time. She appears well-developed and well-nourished. No distress.  HENT:  Head: Normocephalic.  Mouth/Throat: Oropharynx is clear and moist. No oropharyngeal exudate.  Eyes: Conjunctivae and EOM are normal. Right eye exhibits no discharge. Left eye exhibits no discharge. No scleral icterus.  Neck: Normal range of motion. No JVD present. No thyromegaly present.  Cardiovascular: Normal rate, regular rhythm, normal heart sounds and intact distal pulses.  Exam reveals no gallop and no friction rub.   No murmur heard. Pulmonary/Chest: Effort normal and breath sounds normal. No respiratory distress. She has no wheezes. She has no rales.  Abdominal: Soft. Bowel sounds are normal. She exhibits no distension. There is no tenderness. There is no rebound and no guarding.  Musculoskeletal: She exhibits no edema, tenderness or deformity.  Left hip ROM limited due to pain.   Lymphadenopathy:    She has no cervical adenopathy.  Neurological: She is oriented to person, place, and time.  Skin: Skin is warm and dry. No rash noted. No erythema. No pallor.  Left hip incision dry, clean and intact surrounding skin tissue  without any signs or symptoms of infections.   Psychiatric: She has a normal mood and affect.    Labs reviewed: Basic Metabolic Panel:  Recent Labs  12/17/15 1318 12/30/15 0312 12/31/15 0426  NA 139 140 139  K 4.2 4.7 4.0  CL 107 109 105  CO2 25 25 26   GLUCOSE 89 110* 110*  BUN 30* 21* 20  CREATININE 1.50* 1.15* 1.16*  CALCIUM 9.5 8.4* 8.7*   CBC:  Recent Labs  10/30/15 1818  12/30/15 0312 12/31/15 0426 12/31/15 1638  WBC 6.9  < > 6.7 8.8 9.1  NEUTROABS 4.9  --   --   --   --   HGB 12.0  < > 8.4* 7.3* 10.0*  HCT 36.4  < > 26.3* 22.9* 31.0*  MCV 69.3*  < > 69.9* 69.2* 74.0*  PLT 136*  < > 166 140* 140*  < > = values in this interval not displayed.  Assessment/Plan:   HTN B/p stable. Continue on HCTZ and inderal. BMP in 1-2 weeks with PCP  Hyperlipidemia Continue on Lipitor. Monitor lipid panel.   Tremors  Continue on on Primidone 100 mg Tablet daily.   Vit D Continue on Vit D supplements. Monitor vit D level.   Unsteady Gait   Has worked well with PT/OT now stable for discharge home. Will discharged home with Home health PT  to continue with ROM, Exercise, Gait stability and muscle strengthening. She does not require any DME.  Patient is being discharged with the following home health services:    -PT  to continue with ROM, Exercise, Gait stability and muscle strengthening.  Patient is being discharged with the following durable medical equipment:   - She does not require any DME.  Patient has been advised to f/u with their PCP in 1-2 weeks to bring them up to date on their rehab stay.  Social services at facility was responsible for arranging this appointment.  Pt was provided with a 30 day supply of prescriptions for medications and refills must be obtained from their PCP.  For controlled substances, a more limited supply may be provided adequate until PCP appointment only.  Future labs/tests needed:  CBC, BMP in 1-2 weeks with PCP

## 2016-02-24 ENCOUNTER — Telehealth: Payer: Self-pay

## 2016-02-24 ENCOUNTER — Ambulatory Visit (HOSPITAL_COMMUNITY)
Admission: RE | Admit: 2016-02-24 | Discharge: 2016-02-24 | Disposition: A | Discharge status: Home / Self Care | Payer: Medicare HMO | Source: Ambulatory Visit | Attending: Internal Medicine | Admitting: Internal Medicine

## 2016-02-24 DIAGNOSIS — E0520 Thyrotoxicosis with toxic multinodular goiter without thyrotoxic crisis or storm: Secondary | ICD-10-CM | POA: Diagnosis present

## 2016-02-24 MED ORDER — SODIUM IODIDE I 131 CAPSULE
29.4000 | Freq: Once | INTRAVENOUS | Status: AC | PRN
  Administered 2016-02-24: 29.4 via ORAL

## 2016-02-24 NOTE — Telephone Encounter (Signed)
Called and left message advising patient to return phone call to make lab appointment in one month. Gave call back number.

## 2016-03-01 DIAGNOSIS — E559 Vitamin D deficiency, unspecified: Secondary | ICD-10-CM | POA: Insufficient documentation

## 2016-03-01 DIAGNOSIS — G250 Essential tremor: Secondary | ICD-10-CM | POA: Insufficient documentation

## 2016-03-06 ENCOUNTER — Telehealth: Payer: Self-pay

## 2016-03-06 NOTE — Telephone Encounter (Signed)
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

## 2016-03-29 ENCOUNTER — Ambulatory Visit (INDEPENDENT_AMBULATORY_CARE_PROVIDER_SITE_OTHER): Payer: Medicare HMO | Admitting: Internal Medicine

## 2016-03-29 ENCOUNTER — Encounter: Payer: Self-pay | Admitting: Internal Medicine

## 2016-03-29 VITALS — BP 126/64 | HR 53 | Ht 68.5 in | Wt 179.2 lb

## 2016-03-29 DIAGNOSIS — E0580 Other thyrotoxicosis without thyrotoxic crisis or storm: Secondary | ICD-10-CM

## 2016-03-29 DIAGNOSIS — I10 Essential (primary) hypertension: Secondary | ICD-10-CM

## 2016-03-29 DIAGNOSIS — E785 Hyperlipidemia, unspecified: Secondary | ICD-10-CM | POA: Diagnosis not present

## 2016-03-29 MED ORDER — ATORVASTATIN CALCIUM 40 MG PO TABS: 40.0000 mg | ORAL_TABLET | Freq: Every day | ORAL | 3 refills | Status: DC

## 2016-03-29 NOTE — Patient Instructions (Addendum)
Medication Instructions:  STOP Simvastatin START Lipitor 40mg  Take 1 tablet daily at bedtime  Labwork: Your physician recommends that you return for lab work in: [place] 3 MONTHS   Testing/Procedures: NONE   Follow-Up: Your physician wants you to follow-up in: [place]. You will receive a reminder letter in the mail two months in advance. If you don't receive a letter, please call our office to schedule the follow-up appointment.  Any Other Special Instructions Will Be Listed Below (If Applicable).     If you need a refill on your cardiac medications before your next appointment, please call your pharmacy.

## 2016-03-30 NOTE — Progress Notes (Signed)
OFFICE NOTE  Chief Complaint:  No complaints  Primary Care Physician: Tereasa Coop, PA-C  HPI:  Catherine Lowery returns today for hospital follow-up. She presented with atypical chest pain symptoms. She ruled-out for ACS and underwent a myoview, which was negative for ischemia, however, the EF was reduced to 47%. It should also be noted, that she was found to have a suppressed TSH at 0.045.  She has had no treatment for this. She reports no further chest pain today. She denies worsening shortness of breath. She has had worsening tremor. She gets hot flashes a lot and has been sweaty.   Catherine Lowery returns today for follow-up. Overall she feels well. She denies any significant palpitations on propranolol. She continues to take aspirin 325 mg daily. Blood pressure is well-controlled. She is seeing Dr. Cruzita Lowery for workup of a suppressed TSH however T3 and T4 have been fairly normal. Laboratory work for graves disease and other possible etiologies are still in the works. She's not currently on any medication.  03/29/2016  Catherine Lowery was seen today in follow-up. Overall she feels well. She recently saw her primary care provider who noted her total cholesterol was elevated 209 with LDL of 125. EKG shows sinus bradycardia with nonspecific T-wave changes. Blood pressure is at goal today. She has follow-up with her endocrinologist in December for further monitoring of her hyperthyroidism.  PMHx:  Past Medical History:  Diagnosis Date  . DJD (degenerative joint disease)    LEFT HIP  . Hyperlipidemia    takes Simvasatin daily  . Hypertension    takes Propranolol and HCTZ daioly  . Hyperthyroidism   . Pneumonia 10/2015  . PONV (postoperative nausea and vomiting)   . Shortness of breath dyspnea    with ambulation  . Tremors of nervous system    takes Primidone daily    Past Surgical History:  Procedure Laterality Date  . BOne Spur    . COLONOSCOPY    . JOINT REPLACEMENT    .  KNEE SURGERY    . ROTATOR CUFF REPAIR Bilateral   . 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 TOTAL HIP ARTHROPLASTY Left 12/29/2015   Procedure: TOTAL HIP ARTHROPLASTY ANTERIOR APPROACH;  Surgeon: Ninetta Lights, MD;  Location: [facility];  Service: Orthopedics;  Laterality: Left;    FAMHx:  Family History  Problem Relation Age of Onset  . Hypertension Mother   . Heart disease Mother   . Cancer Father     SOCHx:   reports that she has been smoking.  She has a 10.00 pack-year smoking history. She has never used smokeless tobacco. She reports that she does not drink alcohol or use drugs.  ALLERGIES:  Allergies  Allergen Reactions  . Morphine And Related Itching  . Other Itching and Nausea And Vomiting    "PAIN MEDICINES"  . Percocet [Oxycodone-Acetaminophen] Nausea And Vomiting    ROS: Pertinent items noted in HPI and remainder of comprehensive ROS otherwise negative.  HOME MEDS: Current Outpatient Prescriptions  Medication Sig Dispense Refill  . aspirin 325 MG tablet Take 325 mg by mouth daily. Stop date 01/30/16    . hydrochlorothiazide (MICROZIDE) 12.5 MG capsule Take 12.5 mg by mouth every morning.    . primidone (MYSOLINE) 50 MG tablet Take 100 mg by mouth every morning.     . propranolol ER (INDERAL LA) 80 MG 24 hr capsule Take 80 mg by mouth daily.  1  . Vitamin D, Ergocalciferol, (DRISDOL) 50000 UNITS CAPS  Take 50,000 Units by mouth every 7 (seven) days. sundays    . atorvastatin (LIPITOR) 40 MG tablet Take 1 tablet (40 mg total) by mouth daily. 90 tablet 3   No current facility-administered medications for this visit.     LABS/IMAGING: No results found for this or any previous visit (from the past 48 hour(s)). No results found.  WEIGHTS: Wt Readings from Last 3 Encounters:  03/29/16 179 lb 3.2 oz (81.3 kg)  01/13/16 179 lb 6.4 oz (81.4 kg)  01/04/16 176 lb (79.8 kg)    VITALS: BP 126/64   Pulse (!) 53   Ht 5' 8.5" (1.74 m)   Wt 179 lb 3.2 oz (81.3 kg)    LMP 01/24/2000   BMI 26.85 kg/m   EXAM: General appearance: alert and no distress Neck: no carotid bruit, no JVD and thyroid: enlarged Lungs: clear to auscultation bilaterally Heart: regular rate and rhythm, S1, S2 normal, no murmur, click, rub or gallop Abdomen: soft, non-tender; bowel sounds normal; no masses,  no organomegaly Extremities: extremities normal, atraumatic, no cyanosis or edema Pulses: 2+ and symmetric Skin: Skin color, texture, turgor normal. No rashes or lesions Neurologic: Mental status: Alert, oriented, thought content appropriate, fine motor tremor is noted Psych: Pleasant  EKG: Sinus bradycardia 53, nonspecific T wave changes  ASSESSMENT: 1. Atypical chest pain - negative myoview for ischemia 2. Normal LV function by echo, however EF 47% by Myoview (suspect gating abnormality) 3. Subclinical hyperthyroidism - recent radioactive iodine ablation  PLAN: 1.   Catherine Lowery has done well and remains astigmatic. Blood pressure is well-controlled. She has no further palpitations on propranolol. She is on aspirin and low-dose statin. Recent cholesterol shows LDL still elevated at 125. She needs better cholesterol control. Unfortunately she cannot go on higher doses of simvastatin. I would recommend switching that over to Lipitor 40 mg daily. I will plan to recheck cholesterol in 3 months and otherwise see her back in one year.  Pixie Casino, MD, [facility] Attending Cardiologist [place] 03/30/2016, 5:59 PM

## 2016-04-01 ENCOUNTER — Encounter (HOSPITAL_COMMUNITY): Payer: Self-pay | Admitting: *Deleted

## 2016-04-01 ENCOUNTER — Emergency Department (HOSPITAL_COMMUNITY)
Admission: EM | Admit: 2016-04-01 | Discharge: 2016-04-02 | Disposition: A | Discharge status: Home / Self Care | Payer: Medicare HMO | Attending: Emergency Medicine | Admitting: Emergency Medicine

## 2016-04-01 DIAGNOSIS — K029 Dental caries, unspecified: Secondary | ICD-10-CM | POA: Insufficient documentation

## 2016-04-01 DIAGNOSIS — I10 Essential (primary) hypertension: Secondary | ICD-10-CM | POA: Diagnosis not present

## 2016-04-01 DIAGNOSIS — F17200 Nicotine dependence, unspecified, uncomplicated: Secondary | ICD-10-CM | POA: Diagnosis not present

## 2016-04-01 DIAGNOSIS — K0889 Other specified disorders of teeth and supporting structures: Secondary | ICD-10-CM | POA: Diagnosis present

## 2016-04-01 DIAGNOSIS — Z96642 Presence of left artificial hip joint: Secondary | ICD-10-CM | POA: Diagnosis not present

## 2016-04-01 DIAGNOSIS — Z7982 Long term (current) use of aspirin: Secondary | ICD-10-CM | POA: Insufficient documentation

## 2016-04-01 MED ORDER — PENICILLIN V POTASSIUM 500 MG PO TABS: 500.0000 mg | ORAL_TABLET | Freq: Three times a day (TID) | ORAL | 0 refills | Status: DC

## 2016-04-01 MED ORDER — PENICILLIN V POTASSIUM 250 MG PO TABS
500.0000 mg | ORAL_TABLET | Freq: Once | ORAL | Status: AC
Start: 2016-04-01 — End: 2016-04-01
  Administered 2016-04-01: 500 mg via ORAL
  Filled 2016-04-01: qty 2

## 2016-04-01 MED ORDER — LIDOCAINE-EPINEPHRINE (PF) 2 %-1:200000 IJ SOLN
INTRAMUSCULAR | Status: AC
  Administered 2016-04-01: 20 mL
  Filled 2016-04-01: qty 20

## 2016-04-01 NOTE — Discharge Instructions (Signed)
Take Tylenol as directed, and the antibiotic as prescribed. Call Dr. Theodosia Blender office in 2 days to schedule the next available appointment. Tell office staff that you were seen in the emergency department tonight.

## 2016-04-01 NOTE — ED Triage Notes (Signed)
Patient presents with c/o swelling to the back left top area in the back of her mouth.  Stated she just noticed it this morning

## 2016-04-01 NOTE — ED Notes (Signed)
Pt with swollen area to her left upper gum. Pt able to talk in complete sentences. No airway obstruction noticed.

## 2016-04-01 NOTE — ED Notes (Addendum)
Verbal order for Lidocaine with Epinephrine given to this RN by Dr. Winfred Leeds

## 2016-04-01 NOTE — ED Provider Notes (Signed)
[facility] DEPT Provider Note   CSN: [id number] Arrival date & time: 04/01/16  2155     History   Chief Complaint Chief Complaint  Patient presents with  . Dental Pain    HPI Catherine Lowery is a 70 y.o. female.  HPI complains of left upper dental pain at teeth numbers 13 ,14,15 onset 3 days ago noticed swelling on the lingual side of the adjacent gingiva of those teeth 3 days ago. Pain is worse with chewing. No fever no trismus no vomiting. No other associated symptoms. Treated herself with ibuprofen with partial relief.  Past Medical History:  Diagnosis Date  . DJD (degenerative joint disease)    LEFT HIP  . Hyperlipidemia    takes Simvasatin daily  . Hypertension    takes Propranolol and HCTZ daioly  . Hyperthyroidism   . Pneumonia 10/2015  . PONV (postoperative nausea and vomiting)   . Shortness of breath dyspnea    with ambulation  . Tremors of nervous system    takes Primidone daily    Patient Active Problem List   Diagnosis Date Noted  . Primary localized osteoarthritis of left hip 12/29/2015  . Encounter for screening colonoscopy 02/18/2015  . Other thyrotoxicosis without thyrotoxic crisis or storm 02/04/2015  . Hyperlipidemia 01/12/2015  . Benign essential HTN 01/12/2015  . Tobacco abuse 01/12/2015  . Atypical chest pain 01/11/2015    Past Surgical History:  Procedure Laterality Date  . BOne Spur    . COLONOSCOPY    . JOINT REPLACEMENT    . KNEE SURGERY    . ROTATOR CUFF REPAIR Bilateral   . 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 TOTAL HIP ARTHROPLASTY Left 12/29/2015   Procedure: TOTAL HIP ARTHROPLASTY ANTERIOR APPROACH;  Surgeon: Ninetta Lights, MD;  Location: [facility];  Service: Orthopedics;  Laterality: Left;    OB History    No data available       Home Medications    Prior to Admission medications   Medication Sig Start Date End Date Taking? Authorizing Provider  aspirin 325 MG tablet Take 325 mg by mouth daily. Stop date  01/30/16    Historical Provider, MD  atorvastatin (LIPITOR) 40 MG tablet Take 1 tablet (40 mg total) by mouth daily. 03/29/16 06/27/16  Pixie Casino, MD  hydrochlorothiazide (MICROZIDE) 12.5 MG capsule Take 12.5 mg by mouth every morning. 08/27/13   Historical Provider, MD  primidone (MYSOLINE) 50 MG tablet Take 100 mg by mouth every morning.  08/27/13   Historical Provider, MD  propranolol ER (INDERAL LA) 80 MG 24 hr capsule Take 80 mg by mouth daily. 11/03/15   Historical Provider, MD  Vitamin D, Ergocalciferol, (DRISDOL) 50000 UNITS CAPS Take 50,000 Units by mouth every 7 (seven) days. sundays    Historical Provider, MD    Family History Family History  Problem Relation Age of Onset  . Hypertension Mother   . Heart disease Mother   . Cancer Father     Social History Social History  Substance Use Topics  . Smoking status: Current Every Day Smoker    Packs/day: 0.50    Years: 20.00  . Smokeless tobacco: Never Used     Comment: Pt stated "I'm trying to quit"  . Alcohol use No     Allergies   Morphine and related; Other; and Percocet [oxycodone-acetaminophen]   Review of Systems Review of Systems  Constitutional: Negative.   HENT: Positive for dental problem. Negative for drooling.   Gastrointestinal: Negative.  Physical Exam Updated Vital Signs BP 131/59 (BP Location: Right Arm)   Pulse 68   Temp 98.1 F (36.7 C) (Oral)   Resp 18   LMP 01/24/2000   SpO2 96%   Physical Exam  Constitutional: She appears well-developed and well-nourished. No distress.  HENT:  Head: Normocephalic and atraumatic.  Nose: Nose normal.  Mouth/Throat: Oropharynx is clear and moist.  Badly decayed teeth #13, 14 and 15. 3 teeth are tender and slightly loose There is a swollen area of the gingiva lingual surface immediately adjacent to those 3 teeth. No trismus. No facial swelling. No tenderness of submandibular area  Eyes: EOM are normal.  Neck: Neck supple.  Cardiovascular: Normal  rate.   Pulmonary/Chest: Effort normal.  Abdominal: She exhibits no distension.  Musculoskeletal: She exhibits no edema or deformity.  Lymphadenopathy:    She has no cervical adenopathy.  Neurological: No cranial nerve deficit.  Skin: Skin is dry.  Psychiatric: She has a normal mood and affect.     ED Treatments / Results  Labs (all labs ordered are listed, but only abnormal results are displayed) Labs Reviewed - No data to display  EKG  EKG Interpretation None       Radiology No results found.  Procedures Procedures (including critical care time)  Medications Ordered in ED Medications  lidocaine-EPINEPHrine (XYLOCAINE W/EPI) 2 %-1:200000 (PF) injection (20 mLs  Given by Other 04/01/16 2222)     Initial Impression / Assessment and Plan / ED Course  I have reviewed the triage vital signs and the nursing notes.  Pertinent labs & imaging results that were available during my care of the patient were reviewed by me and considered in my medical decision making (see chart for details).  Clinical Course     Procedure timeout performed. I attempted aspiration of the gingiva adjacent to teeth #13, 14 and 15 after area numbed locally with 2% lidocaine with epinephrine. I then attempted aspiration with syringe with an 18-gauge needle with no return of pus. Plan prescription Pen-Vee K, Tylenol for pain. Referral Dr. Radford Pax for dental follow-up Final Clinical Impressions(s) / ED Diagnoses  Diagnosis multiple dental caries Final diagnoses:  None    New Prescriptions New Prescriptions   No medications on file     Orlie Dakin, MD 04/01/16 2309

## 2016-04-05 ENCOUNTER — Ambulatory Visit (INDEPENDENT_AMBULATORY_CARE_PROVIDER_SITE_OTHER): Payer: Medicare HMO | Admitting: Internal Medicine

## 2016-04-05 VITALS — BP 132/68 | HR 72 | Wt 179.0 lb

## 2016-04-05 DIAGNOSIS — E0590 Thyrotoxicosis, unspecified without thyrotoxic crisis or storm: Secondary | ICD-10-CM

## 2016-04-05 DIAGNOSIS — E0520 Thyrotoxicosis with toxic multinodular goiter without thyrotoxic crisis or storm: Secondary | ICD-10-CM

## 2016-04-05 LAB — T3, FREE: T3, Free: 3.4 pg/mL (ref 2.3–4.2)

## 2016-04-05 LAB — TSH: TSH: 0.07 u[IU]/mL — ABNORMAL LOW (ref 0.35–4.50)

## 2016-04-05 LAB — T4, FREE: FREE T4: 0.93 ng/dL (ref 0.60–1.60)

## 2016-04-05 NOTE — Progress Notes (Signed)
Patient ID: Catherine Lowery, female   DOB: [birth year], 70 y.o.   MRN: [id number]   HPI  Catherine Lowery is a 70 y.o.-year-old female, initially referred by her cardiologist, Dr. Debara Pickett, returning for f/u for Toxic MNG, with subclinical thyrotoxicosis. Last visit 4.5 mo ago.  On [facility] now for an abscessed tooth.  Reviewed hx: Pt was found to have a low TSH at the time of a recent hospitalization for CP in 12/2014. She r/o for an AMI. She had a follow up visit with Dr Debara Pickett >> a TSH was repeated >> better, but still low.  TSH was minimally low and improving. Free T4 and free T3 were normal and TSI Ab's were not high.    Reviewed all TFTs available in Epic: Lab Results  Component Value Date   TSH 0.04 (L) 11/15/2015   TSH 0.33 (L) 06/30/2015   TSH 0.20 (L) 03/02/2015   TSH 0.198 (L) 02/04/2015   TSH 0.045 (L) 01/12/2015   FREET4 0.93 11/15/2015   FREET4 0.85 06/30/2015   FREET4 0.78 03/02/2015   FREET4 0.91 02/04/2015    Graves Ab's were not high: Component     Latest Ref Rng 03/02/2015  TSI     <140 % baseline 51   At last visit, we obtained a Thyroid Uptake and scan (11/30/2015): The 72 hour radioactive iodine uptake is equal to 21.7%.  Heterogeneous tracer activity is identified within both lobes of the thyroid gland. Relative areas of decreased uptake localizing to the upper pole of the left lobe, mid right lobe and upper pole of right lobe is noted.  IMPRESSION: 1. 24 hour radioactive iodine uptake is within normal limits at 21.7%. 2. Multi nodular thyroid gland.      We next obtained a thyroid U/S (12/09/2015) to investigate the upper "cold" thyroid nodules - however, these were small, while the dominant nodules appeared "hot" >> we did not have to biopsy them: Right thyroid lobe: 5.9 x 2.0 x 2.4 cm. Multiple nodules are scattered throughout the right lobe.  1.3 x 0.7 x 0.9 cm mid lobe solid nodule. 1.7 x 1.3 x 0.8 cm upper pole solid nodule.  1.9 x 0.9 x 1.2 cm  lower pole nodule.  Other scattered smaller nodules are noted. Left thyroid lobe: 5.6 x 1.5 x 2.0 cm. Multiple solid nodules are scattered throughout the left lobe.  1.0 x 1.1 x 1.4 cm mid lobe nodule.  2.1 x 0.9 x 1.2 cm lower pole nodule.  1.4 x 1.0 x 1.1 cm upper pole nodule.  Other smaller nodules are noted. Isthmus Thickness: 0.4 cm.  Small subcentimeter nodules are present. Lymphadenopathy: None visualized.  Pt had RAI tx (02/24/2016): 29.4 mCi  Pt denies feeling nodules in neck, hoarseness, dysphagia/odynophagia, SOB with lying down; she mentions: - no fatigue - no more weight loss  - resolved hot flushes - since menopause - + tremors  - father and uncles have it also; sees neurology (Dr Amalia Hailey [facility]) - no anxiety - no palpitations - no hyperdefecation - no weight loss - no hair loss  Pt has FH of thyroid cancer In sister. No h/o radiation tx to head or neck.  No seaweed or kelp, no recent contrast studies. No steroid use. No herbal supplements. No Biotin use.  She was switched to Lipitor.  ROS: Constitutional: + see HPI Eyes: no blurry vision, no xerophthalmia ENT: no sore throat, no nodules palpated in throat, no dysphagia/odynophagia, no hoarseness Cardiovascular: no CP/palpitations/leg swelling Respiratory:  no cough/SOB Gastrointestinal: no N/V/D/C Musculoskeletal: no muscle/+ joint aches Skin: no rashes Neurological: + tremors L>R/no numbness/tingling/dizziness  I reviewed pt's medications, allergies, PMH, social hx, family hx, and changes were documented in the history of present illness. Otherwise, unchanged from my initial visit note.  Past Medical History:  Diagnosis Date  . DJD (degenerative joint disease)    LEFT HIP  . Hyperlipidemia    takes Simvasatin daily  . Hypertension    takes Propranolol and HCTZ daioly  . Hyperthyroidism   . Pneumonia 10/2015  . PONV (postoperative nausea and vomiting)   . Shortness of breath dyspnea    with  ambulation  . Tremors of nervous system    takes Primidone daily   Past Surgical History:  Procedure Laterality Date  . BOne Spur    . COLONOSCOPY    . JOINT REPLACEMENT    . KNEE SURGERY    . ROTATOR CUFF REPAIR Bilateral   . 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 TOTAL HIP ARTHROPLASTY Left 12/29/2015   Procedure: TOTAL HIP ARTHROPLASTY ANTERIOR APPROACH;  Surgeon: Ninetta Lights, MD;  Location: [facility];  Service: Orthopedics;  Laterality: Left;   Social History   Social History  . Marital Status: Married    Spouse Name: N/A  . Number of Children: 1   Occupational History   retired   Social History Main Topics  . Smoking status: Current Every Day Smoker -- 0.50 packs/day for 20 years  . Smokeless tobacco: Not on file  . Alcohol Use: No  . Drug Use: No   Current Outpatient Prescriptions on File Prior to Visit  Medication Sig Dispense Refill  . aspirin 325 MG tablet Take 325 mg by mouth daily. Stop date 01/30/16    . atorvastatin (LIPITOR) 40 MG tablet Take 1 tablet (40 mg total) by mouth daily. 90 tablet 3  . hydrochlorothiazide (MICROZIDE) 12.5 MG capsule Take 12.5 mg by mouth every morning.    . penicillin v potassium (VEETID) 500 MG tablet Take 1 tablet (500 mg total) by mouth 3 (three) times daily. 20 tablet 0  . primidone (MYSOLINE) 50 MG tablet Take 100 mg by mouth every morning.     . propranolol ER (INDERAL LA) 80 MG 24 hr capsule Take 80 mg by mouth daily.  1  . Vitamin D, Ergocalciferol, (DRISDOL) 50000 UNITS CAPS Take 50,000 Units by mouth every 7 (seven) days. sundays     No current facility-administered medications on file prior to visit.    Allergies  Allergen Reactions  . Morphine And Related Itching  . Other Itching and Nausea And Vomiting    "PAIN MEDICINES"  . Percocet [Oxycodone-Acetaminophen] Nausea And Vomiting   Family History  Problem Relation Age of Onset  . Hypertension Mother   . Heart disease Mother   . Cancer Father   + see  HPI  PE: BP 132/68   Pulse 72   Wt 179 lb (81.2 kg)   LMP 01/24/2000   BMI 26.82 kg/m  Body mass index is 26.82 kg/m. Wt Readings from Last 3 Encounters:  04/05/16 179 lb (81.2 kg)  03/29/16 179 lb 3.2 oz (81.3 kg)  01/13/16 179 lb 6.4 oz (81.4 kg)   Constitutional: normal weight, in NAD Eyes: PERRLA, EOMI, no exophthalmos, no lid lag, no stare ENT: moist mucous membranes, + lumpy-bumpy thyroid, no cervical lymphadenopathy Cardiovascular: RRR, No MRG Respiratory: CTA B Gastrointestinal: abdomen soft, NT, ND, BS+ Musculoskeletal: no deformities, strength intact in all 4  Skin: moist, warm, no rashes Neurological: + tremor with outstretched hands L>R, DTR normal in all 4  ASSESSMENT: 1. Toxic MNG  PLAN:  1. 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 Tthe dominant nodules appeared overactive on the thyroid scan, so not concerning for ThyCA. The cold nodules were smaller and could have been inflammatory. We will likely need another ultrasound in one year after she becomes euthyroid. She agrees with this plan. - we will recheck free T3/free T4 and TSH. We discussed that she may become hypothyroid needing LT4. We discussed about the potential need to start LT4 and I advised her about how to take the med correctly in case we need to start. - she is on a beta blocker - per cardiology  - I advised her to join my chart to communicate easier - refused - RTC in 3 months, but likely sooner for repeat labs  Office Visit on 04/05/2016  Component Date Value Ref Range Status  . T3, Free 04/05/2016 3.4  2.3 - 4.2 pg/mL Final  . Free T4 04/05/2016 0.93  0.60 - 1.60 ng/dL Final   Comment: Specimens from patients who are undergoing biotin therapy and /or ingesting biotin supplements may contain high levels of biotin.  The higher biotin concentration in these specimens interferes with this Free T4 assay.  Specimens that contain high  levels  of biotin may cause false high results for this Free T4 assay.  Please interpret results in light of the total clinical presentation of the patient.    Marland Kitchen TSH 04/05/2016 0.07* 0.35 - 4.50 uIU/mL Final   Thyroid labs are slightly better. Will have her back in another 5 weeks for recheck, but no intervention is needed for now.  [name], MD PhD [facility] Endocrinology

## 2016-04-05 NOTE — Patient Instructions (Signed)
Please stop at the lab.  If we need to start Levothyroxine: Take the thyroid hormone every day, with water, at least 30 minutes before breakfast, separated by at least 4 hours from: - acid reflux medications - calcium - iron - multivitamins  Please come back for a follow-up appointment in 3 months.

## 2016-04-06 ENCOUNTER — Encounter: Payer: Self-pay | Admitting: Internal Medicine

## 2016-04-06 ENCOUNTER — Telehealth: Payer: Self-pay | Admitting: *Deleted

## 2016-04-06 DIAGNOSIS — E0520 Thyrotoxicosis with toxic multinodular goiter without thyrotoxic crisis or storm: Secondary | ICD-10-CM

## 2016-04-06 NOTE — Telephone Encounter (Signed)
Labs slightly elevated return in 5 weeks for re-check

## 2016-04-27 ENCOUNTER — Other Ambulatory Visit: Payer: Self-pay | Admitting: Internal Medicine

## 2016-04-27 MED ORDER — ATORVASTATIN CALCIUM 40 MG PO TABS: 40.0000 mg | ORAL_TABLET | Freq: Every day | ORAL | 3 refills | Status: DC

## 2016-04-27 NOTE — Telephone Encounter (Signed)
Rx(s) sent to pharmacy electronically.  

## 2016-05-13 ENCOUNTER — Encounter: Payer: Self-pay | Admitting: Internal Medicine

## 2016-05-16 ENCOUNTER — Encounter: Payer: Self-pay | Admitting: *Deleted

## 2016-05-16 ENCOUNTER — Telehealth: Payer: Self-pay | Admitting: Internal Medicine

## 2016-05-16 ENCOUNTER — Encounter: Payer: Self-pay | Admitting: Family Medicine

## 2016-05-16 ENCOUNTER — Ambulatory Visit (INDEPENDENT_AMBULATORY_CARE_PROVIDER_SITE_OTHER): Payer: Medicare HMO | Admitting: Family Medicine

## 2016-05-16 VITALS — BP 130/72 | HR 56 | Temp 98.4°F | Resp 14 | Ht 68.5 in | Wt 182.0 lb

## 2016-05-16 DIAGNOSIS — I10 Essential (primary) hypertension: Secondary | ICD-10-CM | POA: Diagnosis not present

## 2016-05-16 DIAGNOSIS — G250 Essential tremor: Secondary | ICD-10-CM | POA: Diagnosis not present

## 2016-05-16 DIAGNOSIS — E782 Mixed hyperlipidemia: Secondary | ICD-10-CM | POA: Diagnosis not present

## 2016-05-16 DIAGNOSIS — Z720 Tobacco use: Secondary | ICD-10-CM | POA: Diagnosis not present

## 2016-05-16 DIAGNOSIS — R7301 Impaired fasting glucose: Secondary | ICD-10-CM | POA: Insufficient documentation

## 2016-05-16 DIAGNOSIS — Z0000 Encounter for general adult medical examination without abnormal findings: Secondary | ICD-10-CM | POA: Diagnosis not present

## 2016-05-16 NOTE — Telephone Encounter (Signed)
New message      Pt just noticed that she was to stop taking her aspirin in October.  She want to know why she should stop it?  Please call

## 2016-05-16 NOTE — Assessment & Plan Note (Signed)
Counseled on cessation she is not ready to quit 

## 2016-05-16 NOTE — Progress Notes (Signed)
Subjective:   Patient presents for Medicare Annual/Subsequent preventive examination.   Pt here to establish care. Previous followed by [facility] medicine -followed by endocrinology for she is followed by endocrinology for multinodular goiter and subclinical thyrotoxicosis Dr. Renne Crigler She had 1 radiative tablet   - Followed by cardiology Dr. Debara Pickett hyperlipidemia and hypertension- she's currently on hydrochlorothiazide as well as Lipitor propanolol is being used for blood pressure and tremor as well. She had been hospitalized secondary to chest pain she had normal LV function by echo stress Myoview showed an EF of 47% thought to be negative for any ischemia and atypical chest pain. At her visit in November's when she was changed to Lipitor 40 mg once a day  -Essential tremor currently on primidone/propanolol, has family history - Dr. Amalia Hailey at [facility]   -History of degenerative joint disease, status post left hip replacement back in August 2017 she does have a little an unsteady gait. She did go to nursing home for rehabilitation directly afterwards in September.  No current pain medications needed. Orthopedics is Dr. 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 No Over the past two weeks, have you felt little interest or pleasure in doing things? No Have you lost interest or pleasure in daily life? No Do you often feel hopeless? No Do you cry easily over simple problems? No   Activities of Daily Living  In your present state of health, do you have any difficulty performing the following activities?:  Driving? No  Managing money? No  Feeding yourself? No  Getting from bed to chair? No  Climbing a flight of stairs? No  Preparing food and eating?: No  Bathing or showering? No  Getting dressed: No  Getting to the toilet? No  Using the toilet:No  Moving around from place to place: No  In the past year have you fallen or had a near fall?:No  Are you sexually active? No  Do you have more than one partner? No   Hearing Difficulties: No  Do you often ask people to speak up or repeat themselves? No  Do you experience ringing or noises in your ears? No Do you have difficulty understanding soft or whispered voices? No  Do you feel that you have a problem with memory? No Do you often misplace items? No  Do you feel safe at home? Yes  Cognitive Testing  Alert? Yes Normal Appearance?Yes  Oriented to person? Yes Place? Yes  Time? Yes  Recall of three objects? Yes  Can perform simple calculations? Yes  Displays appropriate judgment?Yes  Can read the correct time from a watch face?Yes   List the Names of Other Physician/Practitioners you currently use:   Per above  ROS: GEN- denies fatigue, fever, weight loss,weakness, recent illness HEENT- denies eye drainage, change in vision, nasal discharge, CVS- denies chest pain, palpitations RESP- denies SOB, cough, wheeze ABD- denies N/V, change in stools, abd pain GU- denies dysuria, hematuria, dribbling, incontinence MSK- denies joint pain, muscle aches, injury Neuro- denies headache, dizziness, syncope, seizure  activity   PHYSICAL: GEN- NAD, alert and oriented x3 HEENT- PERRL, EOMI, non injected sclera, pink conjunctiva, MMM, oropharynx clear Neck- Supple, + thryomegaly, no bruit CVS- RRR, no murmur RESP-CTAB ABD-NABS,soft,NT,ND Neuro-CNII-XII in tact, no deficits, tremor  EXT- No edema Pulses- Radial, DP- 2+   Screening Tests / Date Colonoscopy   UTD                   Zostavax - declines  Mammogram UTD  Influenza Vaccine - Due Oct  Tetanus/tdap-  Not able due to financial  Pneumonia-12/23 UTD    Assessment:    Annual wellness medicare exam   Plan:    During the course of the visit the patient was educated and counseled about appropriate screening and preventive services including:  Obtain imaging records  Shingles declines  Screen Neg for depression.  Obtain records from nephrology, renal function looks Okay for her age and her comorbidities  Will check A1c for the elevated fasting blood glucose Referral to new neurologist for her tremor that is closer to her home   Diet review for nutrition referral? Yes ____ Not Indicated __x__  Patient Instructions (the written plan) was given to the patient.  Medicare Attestation  I have personally reviewed:  The patient's medical and social history  Their use of alcohol, tobacco or illicit drugs  Their current medications and supplements  The patient's functional ability including ADLs,fall risks, home safety risks, cognitive, and hearing and visual impairment  Diet and physical activities  Evidence for depression or mood disorders  The patient's weight, height, BMI, and visual acuity have been recorded in the chart. I have made referrals, counseling, and provided education to the patient based on review of the above and I have provided the patient with a written personalized care plan for preventive services.

## 2016-05-16 NOTE — Patient Instructions (Signed)
We will call with lab results Referral to new neurologist  Release of records- [facility] for Bone Density/ Mammogram Release of records- Dr. Florene Glen - [name]  F/U 6 months

## 2016-05-16 NOTE — Telephone Encounter (Signed)
Left msg for patient to call. 

## 2016-05-16 NOTE — Assessment & Plan Note (Signed)
controlled 

## 2016-05-17 ENCOUNTER — Ambulatory Visit: Payer: Medicare HMO | Admitting: Internal Medicine

## 2016-05-17 LAB — HEMOGLOBIN A1C
Hgb A1c MFr Bld: 5.5 % (ref <5.7)
Mean Plasma Glucose: 111 mg/dL

## 2016-05-19 ENCOUNTER — Ambulatory Visit: Payer: Medicare HMO | Admitting: Internal Medicine

## 2016-05-24 NOTE — Telephone Encounter (Signed)
Msg #2 left for patient to call.

## 2016-05-25 ENCOUNTER — Encounter: Payer: Self-pay | Admitting: *Deleted

## 2016-06-06 ENCOUNTER — Ambulatory Visit (INDEPENDENT_AMBULATORY_CARE_PROVIDER_SITE_OTHER): Payer: Medicare HMO | Admitting: Diagnostic Neuroimaging

## 2016-06-06 ENCOUNTER — Encounter: Payer: Self-pay | Admitting: Diagnostic Neuroimaging

## 2016-06-06 VITALS — BP 101/59 | HR 54 | Ht 69.5 in | Wt 184.2 lb

## 2016-06-06 DIAGNOSIS — G250 Essential tremor: Secondary | ICD-10-CM | POA: Diagnosis not present

## 2016-06-06 MED ORDER — PRIMIDONE 50 MG PO TABS: 100.0000 mg | ORAL_TABLET | Freq: Every morning | ORAL | 4 refills | Status: DC

## 2016-06-06 MED ORDER — PROPRANOLOL HCL ER 80 MG PO CP24: 80.0000 mg | ORAL_CAPSULE | Freq: Every day | ORAL | 4 refills | Status: DC

## 2016-06-06 NOTE — Patient Instructions (Signed)
Thank you for coming to see [place] at [facility]. I hope we have been able to provide you high quality care today.  You may receive a patient satisfaction survey over the next few weeks. We would appreciate your feedback and comments so that we may continue to improve ourselves and the health of our patients.  - continue current primidone and propranolol.    ~~~~~~~~~~~~~~~~~~~~~~~~~~~~~~~~~~~~~~~~~~~~~~~~~~~~~~~~~~~~~~~~~  DR. Orlinda Slomski'S GUIDE TO HAPPY AND HEALTHY LIVING These are some of my general health and wellness recommendations. Some of them may apply to you better than others. Please use common sense as you try these suggestions and feel free to ask me any questions.   ACTIVITY/FITNESS Mental, social, emotional and physical stimulation are very important for brain and body health. Try learning a new activity (arts, music, language, sports, games).  Keep moving your body to the best of your abilities. You can do this at home, inside or outside, the park, community center, gym or anywhere you like. Consider a physical therapist or personal trainer to get started. Consider the app Sworkit. Fitness trackers such as smart-watches, smart-phones or Fitbits can help as well.   NUTRITION Eat more plants: colorful vegetables, nuts, seeds and berries.  Eat less sugar, salt, preservatives and processed foods.  Avoid toxins such as cigarettes and alcohol.  Drink water when you are thirsty. Warm water with a slice of lemon is an excellent morning drink to start the day.  Consider these websites for more information The Nutrition Source (https://www.henry-hernandez.biz/) Precision Nutrition (WindowBlog.ch)   RELAXATION Consider practicing mindfulness meditation or other relaxation techniques such as deep breathing, prayer, yoga, tai chi, massage. See website mindful.org or the apps Headspace or Calm to help get  started.   SLEEP Try to get at least 7-8+ hours sleep per day. Regular exercise and reduced caffeine will help you sleep better. Practice good sleep hygeine techniques. See website sleep.org for more information.   PLANNING Prepare estate planning, living will, healthcare POA documents. Sometimes this is best planned with the help of an attorney. Theconversationproject.org and agingwithdignity.org are excellent resources.

## 2016-06-06 NOTE — Progress Notes (Signed)
[facility]  PATIENT: Catherine Lowery DOB: 1945-11-30  REFERRING CLINICIAN: K [place] HISTORY FROM: patient  REASON FOR VISIT: new consult    HISTORICAL  CHIEF COMPLAINT:  Chief Complaint  Patient presents with  . Tremors    rm 7, New Pt, "tremors since being a child, runs on fahter's side of family, as I get older they get worse"    HISTORY OF PRESENT ILLNESS:   71 year old female here for evaluation of essential tremor.   Patient had onset of tremor at age 46 years old. She's had trouble throughout her life.   Age 43 results she was diagnosed with essential tremor.   At age 79 years old she started on propranolol.   At age 39 year old she was started on primidone.  Patient has multiple family members on her father's side who also have essential tremor. Left hand is more affected than right hand. Super degreewhen she is picking up objects or hold her arms out. Medications have helped her tremor. Patient was seeing another neurologist in [place] but now trying to establish with local neurologist in [place].   REVIEW OF SYSTEMS: Full 14 system review of systems performed and negative with exception of: Tremor.  ALLERGIES: Allergies  Allergen Reactions  . Morphine And Related Itching  . Other Itching and Nausea And Vomiting    "PAIN MEDICINES"  . Percocet [Oxycodone-Acetaminophen] Nausea And Vomiting    HOME MEDICATIONS: Outpatient Medications Prior to Visit  Medication Sig Dispense Refill  . aspirin 325 MG tablet Take 325 mg by mouth daily. Stop date 01/30/16    . atorvastatin (LIPITOR) 40 MG tablet Take 1 tablet (40 mg total) by mouth daily. 90 tablet 3  . hydrochlorothiazide (MICROZIDE) 12.5 MG capsule Take 12.5 mg by mouth every morning.    . primidone (MYSOLINE) 50 MG tablet Take 100 mg by mouth every morning.     . propranolol ER (INDERAL LA) 80 MG 24 hr capsule Take 80 mg by mouth daily.  1  . Vitamin D, Ergocalciferol, (DRISDOL)  50000 UNITS CAPS Take 50,000 Units by mouth every 7 (seven) days. sundays     No facility-administered medications prior to visit.     PAST MEDICAL HISTORY: Past Medical History:  Diagnosis Date  . DJD (degenerative joint disease)    LEFT HIP  . Hyperlipidemia    takes Simvasatin daily  . Hypertension    takes Propranolol and HCTZ daioly  . Hyperthyroidism   . Pneumonia 10/2015  . PONV (postoperative nausea and vomiting)   . Shortness of breath dyspnea    with ambulation  . Tremors of nervous system    takes Primidone daily    PAST SURGICAL HISTORY: Past Surgical History:  Procedure Laterality Date  . BOne Spur     left heel  . COLONOSCOPY    . JOINT REPLACEMENT Bilateral    knee  . KNEE SURGERY    . ROTATOR CUFF REPAIR Bilateral   . 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 TOTAL HIP ARTHROPLASTY Left 12/29/2015   Procedure: TOTAL HIP ARTHROPLASTY ANTERIOR APPROACH;  Surgeon: Ninetta Lights, MD;  Location: [facility];  Service: Orthopedics;  Laterality: Left;    FAMILY HISTORY: Family History  Problem Relation Age of Onset  . Hypertension Mother   . Heart disease Mother   . Stroke Mother   . Cancer Father     SOCIAL HISTORY:  Social History   Social History  . Marital status: Married    Spouse  name: N/A  . Number of children: 1  . Years of education: 1   Occupational History  .      retired   Social History Main Topics  . Smoking status: Current Every Day Smoker    Packs/day: 0.50    Years: 20.00  . Smokeless tobacco: Never Used     Comment: 06/06/16 Pt stated "I'm trying to quit"  . Alcohol use No  . Drug use: No  . Sexual activity: Not Currently   Other Topics Concern  . Not on file   Social History Narrative   Lives with handicapped son   Caffeine- coffee, 2 cups daily, 1-2 sodas daily     PHYSICAL EXAM  GENERAL EXAM/CONSTITUTIONAL: Vitals:  Vitals:   06/06/16 0903  BP: (!) 101/59  Pulse: (!) 54  Weight: 184 lb 3.2 oz (83.6 kg)    Height: 5' 9.5" (1.765 m)     Body mass index is 26.81 kg/m.  Visual Acuity Screening   Right eye Left eye Both eyes  Without correction: 20/70 20/200   With correction:     Comments: Did not have glasses with her    Patient is in no distress; well developed, nourished and groomed; neck is supple  CARDIOVASCULAR:  Examination of carotid arteries is normal; no carotid bruits  Regular rate and rhythm, no murmurs  Examination of peripheral vascular system by observation and palpation is normal  EYES:  Ophthalmoscopic exam of optic discs and posterior segments is normal; no papilledema or hemorrhages  MUSCULOSKELETAL:  Gait, strength, tone, movements noted in Neurologic exam below  NEUROLOGIC: MENTAL STATUS:  No flowsheet data found.  awake, alert, oriented to person, place and time  recent and remote memory intact  normal attention and concentration  language fluent, comprehension intact, naming intact,   fund of knowledge appropriate  CRANIAL NERVE:   2nd - no papilledema on fundoscopic exam  2nd, 3rd, 4th, 6th - pupils equal and reactive to light, visual fields full to confrontation, extraocular muscles intact, no nystagmus  5th - facial sensation symmetric  7th - facial strength symmetric  8th - hearing intact  9th - palate elevates symmetrically, uvula midline  11th - shoulder shrug symmetric  12th - tongue protrusion midline  MOTOR:   normal bulk and tone, full strength in the BUE, BLE  MILD POSTURAL TREMOR IN LUE > RUE  MILD HEAD TREMOR  SENSORY:   normal and symmetric to light touch, temperature, vibration  COORDINATION:   finger-nose-finger --> MODERATE INTENTION TREMOR (L > R)  fine finger movements and tapping normal  REFLEXES:   deep tendon reflexes --> BUE 2; KNEES TRACE; ANKLES TRACE  GAIT/STATION:   narrow based gait; able to walk tandem; romberg is negative     DIAGNOSTIC DATA (LABS, IMAGING, TESTING) - I  reviewed patient records, labs, notes, testing and imaging myself where available.  Lab Results  Component Value Date   WBC 9.1 12/31/2015   HGB 10.0 (L) 12/31/2015   HCT 31.0 (L) 12/31/2015   MCV 74.0 (L) 12/31/2015   PLT 140 (L) 12/31/2015      Component Value Date/Time   NA 139 12/31/2015 0426   K 4.0 12/31/2015 0426   CL 105 12/31/2015 0426   CO2 26 12/31/2015 0426   GLUCOSE 110 (H) 12/31/2015 0426   BUN 20 12/31/2015 0426   CREATININE 1.16 (H) 12/31/2015 0426   CALCIUM 8.7 (L) 12/31/2015 0426   GFRNONAA 46 (L) 12/31/2015 0426   GFRAA 54 (  L) 12/31/2015 0426   Lab Results  Component Value Date   CHOL 185 02/04/2015   HDL 32 (L) 02/04/2015   LDLCALC 121 02/04/2015   TRIG 161 (H) 02/04/2015   CHOLHDL 5.8 (H) 02/04/2015   Lab Results  Component Value Date   HGBA1C 5.5 05/16/2016   No results found for: [id number] Lab Results  Component Value Date   TSH 0.07 (L) 04/05/2016        ASSESSMENT AND PLAN  71 y.o. year old female here with essential tremor, well controlled on propranolol and primidone.   Dx:  1. Essential tremor      PLAN: - continue primidone 100mg  daily (sent to [facility])  - continue propranolol LA 80mg  daily (sent to [facility])  - follow up with endocrinology (Dr. Renne Crigler) re: thyroid goiter  - follow up once a year in neurology clinic (unless PCP able to refill primidone and propranolol then patient may follow up here as needed)  Meds ordered this encounter  Medications  . primidone (MYSOLINE) 50 MG tablet    Sig: Take 2 tablets (100 mg total) by mouth every morning.    Dispense:  180 tablet    Refill:  4  . propranolol ER (INDERAL LA) 80 MG 24 hr capsule    Sig: Take 1 capsule (80 mg total) by mouth daily.    Dispense:  90 capsule    Refill:  4   Return in about 1 year (around 06/06/2017).    Penni Bombard, MD [id number], [id number] Certified in Neurology, Neurophysiology and Neuroimaging  [facility] [address] [place], [place] [address] [phone]

## 2016-06-30 ENCOUNTER — Ambulatory Visit (INDEPENDENT_AMBULATORY_CARE_PROVIDER_SITE_OTHER): Payer: Medicare HMO | Admitting: Family Medicine

## 2016-06-30 ENCOUNTER — Encounter: Payer: Self-pay | Admitting: Family Medicine

## 2016-06-30 VITALS — BP 136/70 | HR 76 | Temp 98.9°F | Resp 20 | Ht 69.5 in | Wt 183.0 lb

## 2016-06-30 DIAGNOSIS — J111 Influenza due to unidentified influenza virus with other respiratory manifestations: Secondary | ICD-10-CM | POA: Diagnosis not present

## 2016-06-30 MED ORDER — OSELTAMIVIR PHOSPHATE 75 MG PO CAPS
75.0000 mg | ORAL_CAPSULE | Freq: Two times a day (BID) | ORAL | 0 refills | Status: DC
Start: 2016-06-30 — End: 2016-11-10

## 2016-06-30 NOTE — Patient Instructions (Signed)
Call if not improving  F/U as needed

## 2016-06-30 NOTE — Progress Notes (Signed)
   Subjective:    Patient ID: Catherine Lowery, female    DOB: [birth year], 71 y.o.   MRN: [id number]  Patient presents for Illness (x1 day- fever, HA, body aches, nasal drainage, productive cough with thick white colored mucus, nausea)  Yesterday  Had body aches, fever, chills, cough with congestion, nausea, no vomiting, no diarrhea . Took sudafed yesterday and salt water gargle  Took tylenol for fever this morning " feels like I been run over" No known sick contacts     Review Of Systems:  GEN- + fatigue,= fever, weight loss,weakness, recent illness HEENT- denies eye drainage, change in vision,+ nasal discharge, CVS- denies chest pain, palpitations RESP- denies SOB, +cough, wheeze ABD- +N/ denies V, change in stools, abd pain GU- denies dysuria, hematuria, dribbling, incontinence MSK- denies joint pain, muscle aches, injury Neuro- denies headache, dizziness, syncope, seizure activity       Objective:    BP 136/70   Pulse 76   Temp 98.9 F (37.2 C) (Oral)   Resp 20   Ht 5' 9.5" (1.765 m)   Wt 183 lb (83 kg)   LMP 01/24/2000   SpO2 97%   BMI 26.64 kg/m  GEN- NAD, alert and oriented x3,sickappearing  HEENT- PERRL, EOMI, non injected sclera, pink conjunctiva, MMM, oropharynx clear,TM clear, canals clear  Neck- Supple, no LAD  CVS- RRR, no murmur RESP-CTAB ABD-NABS,soft,NT,ND EXT- No edema Pulses- Radial  2+        Assessment & Plan:      Problem List Items Addressed This Visit    None    Visit Diagnoses    Influenza    -  Primary   Treated based on symptoms and age, tamiflu given, robitussin DM, push fluids    Relevant Medications   oseltamivir (TAMIFLU) 75 MG capsule      Note: This dictation was prepared with Dragon dictation along with smaller phrase technology. Any transcriptional errors that result from this process are unintentional.

## 2016-07-04 ENCOUNTER — Encounter: Payer: Self-pay | Admitting: Internal Medicine

## 2016-07-04 ENCOUNTER — Ambulatory Visit (INDEPENDENT_AMBULATORY_CARE_PROVIDER_SITE_OTHER): Payer: Medicare HMO | Admitting: Internal Medicine

## 2016-07-04 ENCOUNTER — Telehealth: Payer: Self-pay

## 2016-07-04 VITALS — BP 124/84 | HR 79 | Wt 178.0 lb

## 2016-07-04 DIAGNOSIS — E0520 Thyrotoxicosis with toxic multinodular goiter without thyrotoxic crisis or storm: Secondary | ICD-10-CM | POA: Diagnosis not present

## 2016-07-04 LAB — T4, FREE: FREE T4: 0.76 ng/dL (ref 0.60–1.60)

## 2016-07-04 LAB — T3, FREE: T3 FREE: 3 pg/mL (ref 2.3–4.2)

## 2016-07-04 LAB — TSH: TSH: 1.8 u[IU]/mL (ref 0.35–4.50)

## 2016-07-04 NOTE — Patient Instructions (Signed)
Please stop at the lab.  If we need to start levothyroxine, take the thyroid hormone every day, with water, at least 30 minutes before breakfast, separated by at least 4 hours from: - acid reflux medications - calcium - iron - multivitamins  Please come back for a follow-up appointment in 4 months.  

## 2016-07-04 NOTE — Telephone Encounter (Signed)
LVM, gave lab results. Gave call back number if any questions or concerns.  

## 2016-07-04 NOTE — Telephone Encounter (Signed)
-----   Message from [name], MD sent at 07/04/2016 12:35 PM EST ----- Almyra Free, can you please call pt: TFTs are normal ! >> will repeat them at next visit. No intervention needed for now.

## 2016-07-04 NOTE — Progress Notes (Signed)
Patient ID: Catherine Lowery, female   DOB: 1945-07-06, 71 y.o.   MRN: [id number]   HPI  Catherine Lowery is a 71 y.o.-year-old female, initially referred by her cardiologist, Dr. Debara Lowery, returning for f/u for Toxic MNG, with subclinical thyrotoxicosis. Last visit 3 mo ago.  She has a URI.  Reviewed hx: Pt was found to have a low TSH at the time of a recent hospitalization for CP in 12/2014. She r/o for an AMI. She had a follow up visit with Dr Catherine Lowery >> a TSH was repeated >> better, but still low.  TSH was minimally low and improving. Free T4 and free T3 were normal and TSI Ab's were not high.    Reviewed all TFTs available in Epic: Lab Results  Component Value Date   TSH 0.07 (L) 04/05/2016   TSH 0.04 (L) 11/15/2015   TSH 0.33 (L) 06/30/2015   TSH 0.20 (L) 03/02/2015   TSH 0.198 (L) 02/04/2015   TSH 0.045 (L) 01/12/2015   FREET4 0.93 04/05/2016   FREET4 0.93 11/15/2015   FREET4 0.85 06/30/2015   FREET4 0.78 03/02/2015   FREET4 0.91 02/04/2015    Graves Ab's were not high: Component     Latest Ref Rng 03/02/2015  TSI     <140 % baseline 51   At last visit, we obtained a Thyroid Uptake and scan (11/30/2015): The 24 hour radioactive iodine uptake is equal to 21.7%.  Heterogeneous tracer activity is identified within both lobes of the thyroid gland. Relative areas of decreased uptake localizing to the upper pole of the left lobe, mid right lobe and upper pole of right lobe is noted.  IMPRESSION: 1. 24 hour radioactive iodine uptake is within normal limits at 21.7%. 2. Multi nodular thyroid gland.      We next obtained a thyroid U/S (12/09/2015) to investigate the upper "cold" thyroid nodules - however, these were small, while the dominant nodules appeared "hot" >> we did not have to biopsy them: Right thyroid lobe: 5.9 x 2.0 x 2.4 cm. Multiple nodules are scattered throughout the right lobe.  1.3 x 0.7 x 0.9 cm mid lobe solid nodule. 1.7 x 1.3 x 0.8 cm upper pole  solid nodule.  1.9 x 0.9 x 1.2 cm lower pole nodule.  Other scattered smaller nodules are noted. Left thyroid lobe: 5.6 x 1.5 x 2.0 cm. Multiple solid nodules are scattered throughout the left lobe.  1.0 x 1.1 x 1.4 cm mid lobe nodule.  2.1 x 0.9 x 1.2 cm lower pole nodule.  1.4 x 1.0 x 1.1 cm upper pole nodule.  Other smaller nodules are noted. Isthmus Thickness: 0.4 cm.  Small subcentimeter nodules are present. Lymphadenopathy: None visualized.  Pt had RAI tx (02/24/2016): 29.4 mCi  Pt denies feeling nodules in neck, hoarseness, dysphagia/odynophagia, SOB with lying down; she mentions: - + tremors  - familial; sees neurology (Dr [facility]). She is on Propranolol + Primidone for this. - no fatigue (but 5 lbs lost per our scale) - no weight loss  - no hot flushes - no anxiety - no palpitations - no hyperdefecation - no weight loss - no hair loss  Pt has FH of thyroid cancer In sister. No h/o radiation tx to head or neck.  No seaweed or kelp, no recent contrast studies. No steroid use. No herbal supplements. No Biotin use.  ROS: Constitutional: + see HPI Eyes: no blurry vision, no xerophthalmia ENT: no sore throat, no nodules palpated in throat, no  dysphagia/odynophagia, no hoarseness Cardiovascular: no CP/palpitations/leg swelling Respiratory: no cough/SOB Gastrointestinal: no N/V/D/C Musculoskeletal: no muscle/joint aches Skin: no rashes Neurological: + tremors L>R/no numbness/tingling/dizziness  I reviewed pt's medications, allergies, PMH, social hx, family hx, and changes were documented in the history of present illness. Otherwise, unchanged from my initial visit note.  Past Medical History:  Diagnosis Date  . DJD (degenerative joint disease)    LEFT HIP  . Hyperlipidemia    takes Simvasatin daily  . Hypertension    takes Propranolol and HCTZ daioly  . Hyperthyroidism   . Pneumonia 10/2015  . PONV (postoperative nausea and vomiting)   . Shortness  of breath dyspnea    with ambulation  . Tremors of nervous system    takes Primidone daily   Past Surgical History:  Procedure Laterality Date  . BOne Spur     left heel  . COLONOSCOPY    . JOINT REPLACEMENT Bilateral    knee  . KNEE SURGERY    . ROTATOR CUFF REPAIR Bilateral   . 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 TOTAL HIP ARTHROPLASTY Left 12/29/2015   Procedure: TOTAL HIP ARTHROPLASTY ANTERIOR APPROACH;  Surgeon: Catherine Lights, MD;  Location: [place];  Service: Orthopedics;  Laterality: Left;   Social History   Social History  . Marital Status: Married    Spouse Name: N/A  . Number of Children: 1   Occupational History   retired   Social History Main Topics  . Smoking status: Current Every Day Smoker -- 0.50 packs/day for 20 years  . Smokeless tobacco: Not on file  . Alcohol Use: No  . Drug Use: No   Current Outpatient Prescriptions on File Prior to Visit  Medication Sig Dispense Refill  . aspirin 325 MG tablet Take 325 mg by mouth daily. Stop date 01/30/16    . atorvastatin (LIPITOR) 40 MG tablet Take 1 tablet (40 mg total) by mouth daily. 90 tablet 3  . hydrochlorothiazide (MICROZIDE) 12.5 MG capsule Take 12.5 mg by mouth every morning.    Catherine Lowery oseltamivir (TAMIFLU) 75 MG capsule Take 1 capsule (75 mg total) by mouth 2 (two) times daily. 10 capsule 0  . primidone (MYSOLINE) 50 MG tablet Take 2 tablets (100 mg total) by mouth every morning. 180 tablet 4  . propranolol ER (INDERAL LA) 80 MG 24 hr capsule Take 1 capsule (80 mg total) by mouth daily. 90 capsule 4  . Vitamin D, Ergocalciferol, (DRISDOL) 50000 UNITS CAPS Take 50,000 Units by mouth every 7 (seven) days. sundays     No current facility-administered medications on file prior to visit.    Allergies  Allergen Reactions  . Morphine And Related Itching  . Other Itching and Nausea And Vomiting    "PAIN MEDICINES"  . Percocet [Oxycodone-Acetaminophen] Nausea And Vomiting   Family History  Problem  Relation Age of Onset  . Hypertension Mother   . Heart disease Mother   . Stroke Mother   . Cancer Father   + see HPI  PE: BP 124/84 (BP Location: Left Arm, Patient Position: Sitting)   Pulse 79   Wt 178 lb (80.7 kg)   LMP 01/24/2000   SpO2 96%   BMI 25.91 kg/m  Body mass index is 25.91 kg/m. Wt Readings from Last 3 Encounters:  07/04/16 178 lb (80.7 kg)  06/30/16 183 lb (83 kg)  06/06/16 184 lb 3.2 oz (83.6 kg)   Constitutional: normal weight, in NAD Eyes: PERRLA, EOMI, no exophthalmos, no lid lag,  no stare ENT: moist mucous membranes, + lumpy-bumpy thyroid, no cervical lymphadenopathy Cardiovascular: RRR, No MRG Respiratory: CTA B Gastrointestinal: abdomen soft, NT, ND, BS+ Musculoskeletal: no deformities, strength intact in all 4 Skin: moist, warm, no rashes Neurological: + tremor with outstretched hands L>R, DTR normal in all 4  ASSESSMENT: 1. Toxic MNG  PLAN:  1. 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 She had a suppressed TSH and normal free hh after the RAI and I advised her to come back for labs in 1.5 mo but she did not come back >> will recheck them today - We discussed that she may become hypothyroid needing LT4. We discussed about the potential need to start LT4 and I again advised her about how to take the med correctly in case we need to start. - she is on a beta blocker for tremors - I advised her to join my chart to communicate easier - refused - RTC in 4 months, but likely sooner for repeat labs  Office Visit on 07/04/2016  Component Date Value Ref Range Status  . TSH 07/04/2016 1.80  0.35 - 4.50 uIU/mL Final  . Free T4 07/04/2016 0.76  0.60 - 1.60 ng/dL Final   Comment: Specimens from patients who are undergoing biotin therapy and /or ingesting biotin supplements may contain high levels of biotin.  The higher biotin concentration in these specimens interferes with this Free T4 assay.   Specimens that contain high levels  of biotin may cause false high results for this Free T4 assay.  Please interpret results in light of the total clinical presentation of the patient.    . T3, Free 07/04/2016 3.0  2.3 - 4.2 pg/mL Final   TFTs are normal >> will repeat them at next visit. No LT4 needed!  [name], MD PhD [facility] Endocrinology

## 2016-09-27 LAB — HM MAMMOGRAPHY

## 2016-09-29 ENCOUNTER — Encounter: Payer: Self-pay | Admitting: *Deleted

## 2016-10-15 ENCOUNTER — Other Ambulatory Visit: Payer: Self-pay | Admitting: Family Medicine

## 2016-11-10 ENCOUNTER — Ambulatory Visit (INDEPENDENT_AMBULATORY_CARE_PROVIDER_SITE_OTHER): Payer: Medicare HMO | Admitting: Internal Medicine

## 2016-11-10 ENCOUNTER — Telehealth: Payer: Self-pay

## 2016-11-10 VITALS — BP 120/72 | HR 72 | Wt 181.0 lb

## 2016-11-10 DIAGNOSIS — E0520 Thyrotoxicosis with toxic multinodular goiter without thyrotoxic crisis or storm: Secondary | ICD-10-CM | POA: Diagnosis not present

## 2016-11-10 LAB — T3, FREE: T3, Free: 3.4 pg/mL (ref 2.3–4.2)

## 2016-11-10 LAB — T4, FREE: FREE T4: 0.67 ng/dL (ref 0.60–1.60)

## 2016-11-10 LAB — TSH: TSH: 1.89 u[IU]/mL (ref 0.35–4.50)

## 2016-11-10 NOTE — Patient Instructions (Signed)
Please stop at the lab.  If we need to start levothyroxine, take the thyroid hormone every day, with water, at least 30 minutes before breakfast, separated by at least 4 hours from: - acid reflux medications - calcium - iron - multivitamins  Please come back for a follow-up appointment in 4 months.

## 2016-11-10 NOTE — Progress Notes (Signed)
Patient ID: Catherine Lowery, female   DOB: [birth year], 71 y.o.   MRN: [id number]   HPI  Catherine Lowery is a 71 y.o.-year-old female, initially referred by her cardiologist, Dr. Debara Pickett, returning for f/u for Toxic MNG, with subclinical thyrotoxicosis, now s/p RAI tx. Last visit 4 mo ago.  Reviewed hx: Pt was found to have a low TSH at the time of a recent hospitalization for CP in 12/2014. She r/o for an AMI. She had a follow up visit with Dr Debara Pickett >> a TSH was repeated >> better, but still low.  TSH was minimally low and improving. Free T4 and free T3 were normal and TSI Ab's were not high.    Reviewed TFTs: normalized after her RAI tx in 01/2016: Lab Results  Component Value Date   TSH 1.80 07/04/2016   TSH 0.07 (L) 04/05/2016   TSH 0.04 (L) 11/15/2015   TSH 0.33 (L) 06/30/2015   TSH 0.20 (L) 03/02/2015   TSH 0.198 (L) 02/04/2015   TSH 0.045 (L) 01/12/2015   FREET4 0.76 07/04/2016   FREET4 0.93 04/05/2016   FREET4 0.93 11/15/2015   FREET4 0.85 06/30/2015   FREET4 0.78 03/02/2015   FREET4 0.91 02/04/2015    Graves Ab's were not high: Lab Results  Component Value Date   TSI 51 03/02/2015   At last visit, we obtained a Thyroid Uptake and scan (11/30/2015): The 24 hour radioactive iodine uptake is equal to 21.7%.  Heterogeneous tracer activity is identified within both lobes of the thyroid gland. Relative areas of decreased uptake localizing to the upper pole of the left lobe, mid right lobe and upper pole of right lobe is noted.  IMPRESSION: 1. 24 hour radioactive iodine uptake is within normal limits at 21.7%. 2. Multi nodular thyroid gland.      We next obtained a thyroid U/S (12/09/2015) to investigate the upper "cold" thyroid nodules - however, these were small, while the dominant nodules appeared "hot" >> we did not have to biopsy them: Right thyroid lobe: 5.9 x 2.0 x 2.4 cm. Multiple nodules are scattered throughout the right lobe.  1.3 x 0.7 x 0.9 cm mid  lobe solid nodule. 1.7 x 1.3 x 0.8 cm upper pole solid nodule.  1.9 x 0.9 x 1.2 cm lower pole nodule.  Other scattered smaller nodules are noted. Left thyroid lobe: 5.6 x 1.5 x 2.0 cm. Multiple solid nodules are scattered throughout the left lobe.  1.0 x 1.1 x 1.4 cm mid lobe nodule.  2.1 x 0.9 x 1.2 cm lower pole nodule.  1.4 x 1.0 x 1.1 cm upper pole nodule.  Other smaller nodules are noted. Isthmus Thickness: 0.4 cm.  Small subcentimeter nodules are present. Lymphadenopathy: None visualized.  Pt had RAI tx (02/24/2016): 29.4 mCi  Pt mentions: - + tremors  - chronic,  familial; sees neurology (Dr Amalia Hailey [facility]). She is on Propranolol + Primidone for this.  - + fatigue -  She thinks this is from Lipitor, after changing from Zocor  She denies: - weight loss or gain - heat intolerance - tremors - palpitations - anxiety - hyperdefecation - hair loss  Pt has FH of thyroid cancer In sister. No FH of thyroid cancer. No h/o radiation tx to head or neck.  No seaweed or kelp. No recent contrast studies. No herbal supplements. No Biotin use. No recent steroids use.   ROS: Constitutional: no weight gain/no weight loss, no fatigue, no subjective hyperthermia, no subjective hypothermia Eyes: no blurry  vision, no xerophthalmia ENT: no sore throat, no nodules palpated in throat, no dysphagia, no odynophagia, no hoarseness Cardiovascular: no CP/no SOB/no palpitations/no leg swelling Respiratory: no cough/no SOB/no wheezing Gastrointestinal: no N/no V/no D/no C/no acid reflux Musculoskeletal: no muscle aches/no joint aches Skin: no rashes, no hair loss Neurological: + tremors/no numbness/no tingling/no dizziness  I reviewed pt's medications, allergies, PMH, social hx, family hx, and changes were documented in the history of present illness. Otherwise, unchanged from my initial visit note.   Past Medical History:  Diagnosis Date  . DJD (degenerative joint disease)    LEFT HIP    . Hyperlipidemia    takes Simvasatin daily  . Hypertension    takes Propranolol and HCTZ daioly  . Hyperthyroidism   . Pneumonia 10/2015  . PONV (postoperative nausea and vomiting)   . Shortness of breath dyspnea    with ambulation  . Tremors of nervous system    takes Primidone daily   Past Surgical History:  Procedure Laterality Date  . BOne Spur     left heel  . COLONOSCOPY    . JOINT REPLACEMENT Bilateral    knee  . KNEE SURGERY    . ROTATOR CUFF REPAIR Bilateral   . 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 TOTAL HIP ARTHROPLASTY Left 12/29/2015   Procedure: TOTAL HIP ARTHROPLASTY ANTERIOR APPROACH;  Surgeon: Ninetta Lights, MD;  Location: [place];  Service: Orthopedics;  Laterality: Left;   Social History   Social History  . Marital Status: Married    Spouse Name: N/A  . Number of Children: 1   Occupational History   retired   Social History Main Topics  . Smoking status: Current Every Day Smoker -- 0.50 packs/day for 20 years  . Smokeless tobacco: Not on file  . Alcohol Use: No  . Drug Use: No   Current Outpatient Prescriptions on File Prior to Visit  Medication Sig Dispense Refill  . aspirin 325 MG tablet Take 325 mg by mouth daily. Stop date 01/30/16    . hydrochlorothiazide (MICROZIDE) 12.5 MG capsule Take 12.5 mg by mouth every morning.    . primidone (MYSOLINE) 50 MG tablet Take 2 tablets (100 mg total) by mouth every morning. 180 tablet 4  . propranolol ER (INDERAL LA) 80 MG 24 hr capsule Take 1 capsule (80 mg total) by mouth daily. 90 capsule 4  . Vitamin D, Ergocalciferol, (DRISDOL) 50000 UNITS CAPS Take 50,000 Units by mouth every 7 (seven) days. sundays    . VOLTAREN 1 % GEL APPLY TOPICALLY 4 TIMES DAILY AS NEEDED 100 g 0  . atorvastatin (LIPITOR) 40 MG tablet Take 1 tablet (40 mg total) by mouth daily. 90 tablet 3   No current facility-administered medications on file prior to visit.    Allergies  Allergen Reactions  . Morphine And Related  Itching  . Other Itching and Nausea And Vomiting    "PAIN MEDICINES"  . Percocet [Oxycodone-Acetaminophen] Nausea And Vomiting   Family History  Problem Relation Age of Onset  . Hypertension Mother   . Heart disease Mother   . Stroke Mother   . Cancer Father   + see HPI  PE: BP 120/72 (BP Location: Left Arm, Patient Position: Sitting)   Pulse 72   Wt 181 lb (82.1 kg)   LMP 01/24/2000   SpO2 98%   BMI 26.35 kg/m  Body mass index is 26.35 kg/m. Wt Readings from Last 3 Encounters:  11/10/16 181 lb (82.1 kg)  07/04/16  178 lb (80.7 kg)  06/30/16 183 lb (83 kg)   Constitutional: slightly overweight, in NAD Eyes: PERRLA, EOMI, no exophthalmos ENT: moist mucous membranes, + lumpy-bumpy thyroid, no cervical lymphadenopathy Cardiovascular: RRR, No MRG Respiratory: CTA B Gastrointestinal: abdomen soft, NT, ND, BS+ Musculoskeletal: no deformities, strength intact in all 4 Skin: moist, warm, no rashes Neurological: + tremor with outstretched hands L>R, DTR normal in all 4  ASSESSMENT: 1. Toxic MNG  PLAN:  1. Patient with History of multiple thyroid nodules and subclinical thyrotoxicosis secondary to TMNG, now s/p RAI tx 01/2016, with subsequent euthyroidism. - She has no complaints at this visit and displays no hyperthyroid or hypothyroid signs - We discussed about the fact that she may become hypothyroid after her RAI treatment, needing levothyroxine. We again discussed about how to take this in case she needs to start: - in am - fasting - With water - at least 30 min from b'fast - Separated by at least 4 hours from Ca, Fe, MVI, PPIs - she is on a beta blocker for tremors; these are chronic - she refused joining my chart - We will check her TFTs today - I will see her back in 4 months.  Office Visit on 11/10/2016  Component Date Value Ref Range Status  . TSH 11/10/2016 1.89  0.35 - 4.50 uIU/mL Final  . Free T4 11/10/2016 0.67  0.60 - 1.60 ng/dL Final   Comment: Specimens  from patients who are undergoing biotin therapy and /or ingesting biotin supplements may contain high levels of biotin.  The higher biotin concentration in these specimens interferes with this Free T4 assay.  Specimens that contain high levels  of biotin may cause false high results for this Free T4 assay.  Please interpret results in light of the total clinical presentation of the patient.    . T3, Free 11/10/2016 3.4  2.3 - 4.2 pg/mL Final   Normal labs! >> no intervention needed.  [name], MD PhD [facility] Endocrinology

## 2016-11-10 NOTE — Telephone Encounter (Signed)
Called patient and gave lab results. Patient had no questions or concerns.  

## 2016-11-20 ENCOUNTER — Ambulatory Visit (INDEPENDENT_AMBULATORY_CARE_PROVIDER_SITE_OTHER): Payer: Medicare HMO | Admitting: Family Medicine

## 2016-11-20 ENCOUNTER — Encounter: Payer: Self-pay | Admitting: Family Medicine

## 2016-11-20 VITALS — BP 126/72 | HR 88 | Temp 98.1°F | Resp 14 | Ht 69.5 in | Wt 182.0 lb

## 2016-11-20 DIAGNOSIS — R252 Cramp and spasm: Secondary | ICD-10-CM

## 2016-11-20 DIAGNOSIS — I10 Essential (primary) hypertension: Secondary | ICD-10-CM | POA: Diagnosis not present

## 2016-11-20 DIAGNOSIS — W57XXXA Bitten or stung by nonvenomous insect and other nonvenomous arthropods, initial encounter: Secondary | ICD-10-CM | POA: Diagnosis not present

## 2016-11-20 DIAGNOSIS — E782 Mixed hyperlipidemia: Secondary | ICD-10-CM | POA: Diagnosis not present

## 2016-11-20 NOTE — Patient Instructions (Addendum)
Use antibiotic ointment  Conenzyme Q 10 take with lipitor for leg cramps We will call with lab results Blood pressure looks good F/U 6 months Physical

## 2016-11-20 NOTE — Assessment & Plan Note (Signed)
Control no change in medication.

## 2016-11-20 NOTE — Assessment & Plan Note (Signed)
Recheck lipids For the leg cramps or have her take coenzyme Q10 with the Lipitor and see if this improves her symptoms. She is also staying hydrated.

## 2016-11-20 NOTE — Progress Notes (Signed)
   Subjective:    Patient ID: Catherine Lowery, female    DOB: [birth year], 71 y.o.   MRN: [id number]  Patient presents for Leg Cramps (states that she is having more cramps to legs since starting Lipitor); Irritation (x1 month- redness and small bumps to back of L leg); and Tick Bite (states that she removed tick from R side)  Pt here for cramps    Tick bite- removed from right lower back last Friday, she scrtached it off, no fever , chills, no joint pain   Rash- dark spot on back of left leg noticed about month ago, no itching no pain,initially was red, then became darker but lightenting up, nitially though was some bruising  No change in size   Leg cramps for past month Or so. Her Lipitor dose was changed back in November she thinks it is related to this. She does use a teaspoon a mustard and this resolves her symptoms quickly. Cramps are worse at nighttime. Does not affect her daily activities       Review Of Systems:  GEN- denies fatigue, fever, weight loss,weakness, recent illness HEENT- denies eye drainage, change in vision, nasal discharge, CVS- denies chest pain, palpitations RESP- denies SOB, cough, wheeze ABD- denies N/V, change in stools, abd pain GU- denies dysuria, hematuria, dribbling, incontinence MSK- denies joint pain, muscle aches, injury Neuro- denies headache, dizziness, syncope, seizure activity       Objective:    BP 126/72   Pulse 88   Temp 98.1 F (36.7 C) (Oral)   Resp 14   Ht 5' 9.5" (1.765 m)   Wt 182 lb (82.6 kg)   LMP 01/24/2000   SpO2 96%   BMI 26.49 kg/m  GEN- NAD, alert and oriented x3 HEENT- PERRL, EOMI, non injected sclera, pink conjunctiva, MMM, oropharynx clear Neck- Supple, no thyromegaly CVS- RRR, no murmur RESP-CTAB Skin- Right lower back scab previous tick bite, no erythema, no rash, Left post lower leg, small hypigmented macule, with dry skin, NT, no erythema  EXT- Ankle edema bilat, non pitting  Pulses- Radial, DP-  2+        Assessment & Plan:      Problem List Items Addressed This Visit    Leg cramps   Relevant Orders   CBC with Differential/Platelet   Comprehensive metabolic panel   Hyperlipidemia - Primary    Recheck lipids For the leg cramps or have her take coenzyme Q10 with the Lipitor and see if this improves her symptoms. She is also staying hydrated.      Relevant Orders   CBC with Differential/Platelet   Comprehensive metabolic panel   Lipid panel   Benign essential HTN    Control no change in medication.       Other Visit Diagnoses    Tick bite, initial encounter       No sign of Lyme disease. Unclear the hyperpigmented macule without a rash was there before at this time not causing any symptoms. She can use topical antibioticTo the tick bite area.       Note: This dictation was prepared with Dragon dictation along with smaller phrase technology. Any transcriptional errors that result from this process are unintentional.

## 2016-11-21 LAB — CBC WITH DIFFERENTIAL/PLATELET
Basophils Absolute: 0 cells/uL (ref 0–200)
Basophils Relative: 0 %
Eosinophils Absolute: 329 cells/uL (ref 15–500)
Eosinophils Relative: 7 %
HCT: 35 % (ref 35.0–45.0)
Hemoglobin: 11.2 g/dL — ABNORMAL LOW (ref 12.0–15.0)
LYMPHS PCT: 41 %
Lymphs Abs: 1927 cells/uL (ref 850–3900)
MCH: 23.2 pg — ABNORMAL LOW (ref 27.0–33.0)
MCHC: 32 g/dL (ref 32.0–36.0)
MCV: 72.5 fL — ABNORMAL LOW (ref 80.0–100.0)
MONOS PCT: 10 %
MPV: 9.6 fL (ref 7.5–12.5)
Monocytes Absolute: 470 cells/uL (ref 200–950)
NEUTROS ABS: 1974 {cells}/uL (ref 1500–7800)
Neutrophils Relative %: 42 %
PLATELETS: 204 10*3/uL (ref 140–400)
RBC: 4.83 MIL/uL (ref 3.80–5.10)
RDW: 15.8 % — AB (ref 11.0–15.0)
WBC: 4.7 10*3/uL (ref 3.8–10.8)

## 2016-11-21 LAB — COMPREHENSIVE METABOLIC PANEL
ALK PHOS: 82 U/L (ref 33–130)
ALT: 17 U/L (ref 6–29)
AST: 19 U/L (ref 10–35)
Albumin: 4 g/dL (ref 3.6–5.1)
BILIRUBIN TOTAL: 0.4 mg/dL (ref 0.2–1.2)
BUN: 21 mg/dL (ref 7–25)
CO2: 25 mmol/L (ref 20–31)
CREATININE: 1.25 mg/dL — AB (ref 0.60–0.93)
Calcium: 8.9 mg/dL (ref 8.6–10.4)
Chloride: 108 mmol/L (ref 98–110)
GLUCOSE: 82 mg/dL (ref 70–99)
Potassium: 4.1 mmol/L (ref 3.5–5.3)
SODIUM: 139 mmol/L (ref 135–146)
Total Protein: 7 g/dL (ref 6.1–8.1)

## 2016-11-21 LAB — LIPID PANEL
CHOL/HDL RATIO: 4.9 ratio (ref <5.0)
Cholesterol: 148 mg/dL (ref <200)
HDL: 30 mg/dL — ABNORMAL LOW (ref >50)
LDL Cholesterol: 87 mg/dL (ref <100)
Triglycerides: 156 mg/dL — ABNORMAL HIGH (ref <150)
VLDL: 31 mg/dL — ABNORMAL HIGH (ref <30)

## 2016-11-23 ENCOUNTER — Encounter: Payer: Self-pay | Admitting: *Deleted

## 2016-11-27 ENCOUNTER — Encounter: Payer: Self-pay | Admitting: Internal Medicine

## 2017-02-20 ENCOUNTER — Ambulatory Visit: Payer: Medicare HMO

## 2017-03-08 DIAGNOSIS — E663 Overweight: Secondary | ICD-10-CM | POA: Diagnosis not present

## 2017-03-08 DIAGNOSIS — M158 Other polyosteoarthritis: Secondary | ICD-10-CM | POA: Diagnosis not present

## 2017-03-08 DIAGNOSIS — D649 Anemia, unspecified: Secondary | ICD-10-CM | POA: Diagnosis not present

## 2017-03-08 DIAGNOSIS — F17210 Nicotine dependence, cigarettes, uncomplicated: Secondary | ICD-10-CM | POA: Diagnosis not present

## 2017-03-08 DIAGNOSIS — I10 Essential (primary) hypertension: Secondary | ICD-10-CM | POA: Diagnosis not present

## 2017-03-08 DIAGNOSIS — G250 Essential tremor: Secondary | ICD-10-CM | POA: Diagnosis not present

## 2017-03-08 DIAGNOSIS — H269 Unspecified cataract: Secondary | ICD-10-CM | POA: Diagnosis not present

## 2017-03-08 DIAGNOSIS — J449 Chronic obstructive pulmonary disease, unspecified: Secondary | ICD-10-CM | POA: Diagnosis not present

## 2017-03-08 DIAGNOSIS — E785 Hyperlipidemia, unspecified: Secondary | ICD-10-CM | POA: Diagnosis not present

## 2017-03-13 ENCOUNTER — Ambulatory Visit (INDEPENDENT_AMBULATORY_CARE_PROVIDER_SITE_OTHER): Payer: Medicare HMO | Admitting: Internal Medicine

## 2017-03-13 VITALS — BP 118/60 | HR 74 | Wt 185.0 lb

## 2017-03-13 DIAGNOSIS — E0520 Thyrotoxicosis with toxic multinodular goiter without thyrotoxic crisis or storm: Secondary | ICD-10-CM

## 2017-03-13 DIAGNOSIS — Z23 Encounter for immunization: Secondary | ICD-10-CM

## 2017-03-13 LAB — T4, FREE: FREE T4: 0.78 ng/dL (ref 0.60–1.60)

## 2017-03-13 LAB — T3, FREE: T3, Free: 3.3 pg/mL (ref 2.3–4.2)

## 2017-03-13 LAB — TSH: TSH: 2.43 u[IU]/mL (ref 0.35–4.50)

## 2017-03-13 NOTE — Progress Notes (Signed)
Patient ID: Catherine Lowery, female   DOB: 02-20-1946, 71 y.o.   MRN: [id number]   HPI  Catherine Lowery is a 71 y.o.-year-old female, initially referred by her cardiologist, Dr. Debara Pickett, returning for f/u for Toxic MNG, with subclinical thyrotoxicosis, now s/p RAI tx. Last visit 4 months ago.  Reviewed and attended history: Pt was found to have a low TSH at the time of a recent hospitalization for CP in 12/2014. She r/o for an AMI. She had a follow up visit with Dr Debara Pickett >> a TSH was repeated >> better, but still low   Thyroid Uptake and scan (11/30/2015) report reviewed:  The 24 hour radioactive iodine uptake is equal to 21.7%.  Heterogeneous tracer activity is identified within both lobes of the thyroid gland. Relative areas of decreased uptake localizing to the upper pole of the left lobe, mid right lobe and upper pole of right lobe is noted.  IMPRESSION: 1. 24 hour radioactive iodine uptake is within normal limits at 21.7%. 2. Multi nodular thyroid gland.      We next obtained a thyroid U/S (12/09/2015) to investigate the upper "cold" thyroid nodules - however, these were small, while the dominant nodules appeared "hot" >> we did not have to biopsy them: Right thyroid lobe: 5.9 x 2.0 x 2.4 cm. Multiple nodules are scattered throughout the right lobe.  1.3 x 0.7 x 0.9 cm mid lobe solid nodule. 1.7 x 1.3 x 0.8 cm upper pole solid nodule.  1.9 x 0.9 x 1.2 cm lower pole nodule.  Other scattered smaller nodules are noted. Left thyroid lobe: 5.6 x 1.5 x 2.0 cm. Multiple solid nodules are scattered throughout the left lobe.  1.0 x 1.1 x 1.4 cm mid lobe nodule.  2.1 x 0.9 x 1.2 cm lower pole nodule.  1.4 x 1.0 x 1.1 cm upper pole nodule.  Other smaller nodules are noted. Isthmus Thickness: 0.4 cm.  Small subcentimeter nodules are present. Lymphadenopathy: None visualized.  Pt had RAI tx (02/24/2016): 29.4 mCi   Reviewed TFTs: Normalized after her RAI treatment in  01/2016: Lab Results  Component Value Date   TSH 1.89 11/10/2016   TSH 1.80 07/04/2016   TSH 0.07 (L) 04/05/2016   TSH 0.04 (L) 11/15/2015   TSH 0.33 (L) 06/30/2015   TSH 0.20 (L) 03/02/2015   TSH 0.198 (L) 02/04/2015   TSH 0.045 (L) 01/12/2015   FREET4 0.67 11/10/2016   FREET4 0.76 07/04/2016   FREET4 0.93 04/05/2016   FREET4 0.93 11/15/2015   FREET4 0.85 06/30/2015   FREET4 0.78 03/02/2015   FREET4 0.91 02/04/2015    Graves Ab's were not high: Lab Results  Component Value Date   TSI 51 03/02/2015   Pt mentions: - + 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  She sees neurology (Dr. Amalia Hailey in [place]).  She is on propranolol and primidone.  Pt denies: - feeling nodules in neck - hoarseness - dysphagia - choking - SOB with lying down  Pt has FH of thyroid cancer In sister. No FH of thyroid cancer. No h/o radiation tx to head or neck.  No seaweed or kelp. No recent contrast studies. No herbal supplements. No Biotin use. No recent steroids use.   ROS: Constitutional: no weight gain/no weight loss, no fatigue, no subjective hyperthermia, no subjective hypothermia Eyes: no blurry vision, no xerophthalmia ENT: no sore throat, + see HPI Cardiovascular: no CP/no SOB/no palpitations/+ leg swelling Respiratory: no cough/no SOB/no wheezing Gastrointestinal: no N/no V/no D/no C/no acid  reflux Musculoskeletal: no muscle aches/no joint aches Skin: no rashes, no hair loss Neurological: no tremors/no numbness/no tingling/no dizziness  I reviewed pt's medications, allergies, PMH, social hx, family hx, and changes were documented in the history of present illness. Otherwise, unchanged from my initial visit note.  Past Medical History:  Diagnosis Date  . DJD (degenerative joint disease)    LEFT HIP  . Hyperlipidemia    takes Simvasatin daily  . Hypertension    takes Propranolol and HCTZ daioly  . Hyperthyroidism   . Pneumonia 10/2015  . PONV (postoperative nausea and  vomiting)   . Shortness of breath dyspnea    with ambulation  . Tremors of nervous system    takes Primidone daily   Past Surgical History:  Procedure Laterality Date  . BOne Spur     left heel  . COLONOSCOPY    . JOINT REPLACEMENT Bilateral    knee  . KNEE SURGERY    . ROTATOR CUFF REPAIR Bilateral   . TOTAL HIP ARTHROPLASTY Left 12/29/2015   Social History   Social History  . Marital Status: Married    Spouse Name: N/A  . Number of Children: 1   Occupational History   retired   Social History Main Topics  . Smoking status: Current Every Day Smoker -- 0.50 packs/day for 20 years  . Smokeless tobacco: Not on file  . Alcohol Use: No  . Drug Use: No   Current Outpatient Medications on File Prior to Visit  Medication Sig Dispense Refill  . aspirin 325 MG tablet Take 325 mg by mouth daily. Stop date 01/30/16    . hydrochlorothiazide (MICROZIDE) 12.5 MG capsule Take 12.5 mg by mouth every morning.    . primidone (MYSOLINE) 50 MG tablet Take 2 tablets (100 mg total) by mouth every morning. 180 tablet 4  . propranolol ER (INDERAL LA) 80 MG 24 hr capsule Take 1 capsule (80 mg total) by mouth daily. 90 capsule 4  . Vitamin D, Ergocalciferol, (DRISDOL) 50000 UNITS CAPS Take 50,000 Units by mouth every 7 (seven) days. sundays    . VOLTAREN 1 % GEL APPLY TOPICALLY 4 TIMES DAILY AS NEEDED 100 g 0  . atorvastatin (LIPITOR) 40 MG tablet Take 1 tablet (40 mg total) by mouth daily. 90 tablet 3   No current facility-administered medications on file prior to visit.    Allergies  Allergen Reactions  . Morphine And Related Itching  . Other Itching and Nausea And Vomiting    "PAIN MEDICINES"  . Percocet [Oxycodone-Acetaminophen] Nausea And Vomiting   Family History  Problem Relation Age of Onset  . Hypertension Mother   . Heart disease Mother   . Stroke Mother   . Cancer Father   + see HPI  PE: BP 118/60 (BP Location: Left Arm, Patient Position: Sitting)   Pulse 74   Wt 185 lb  (83.9 kg)   LMP 01/24/2000   SpO2 95%   BMI 26.93 kg/m  Body mass index is 26.93 kg/m. Wt Readings from Last 3 Encounters:  03/13/17 185 lb (83.9 kg)  11/20/16 182 lb (82.6 kg)  11/10/16 181 lb (82.1 kg)   Constitutional: Slightly overweight, in NAD Eyes: PERRLA, EOMI, no exophthalmos ENT: moist mucous membranes, + lumpy-bumpy thyroid, no cervical lymphadenopathy Cardiovascular: RRR, No MRG Respiratory: CTA B Gastrointestinal: abdomen soft, NT, ND, BS+ Musculoskeletal: no deformities, strength intact in all 4 Skin: moist, warm, no rashes Neurological: + tremor with outstretched hands, DTR normal in all 4  ASSESSMENT: 1. Toxic MNG  PLAN:  1. 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 - She has no complaints at this visit and displays no hyper or hypothyroid signs or symptoms - we again discussed about the possibility of becoming hypothyroid after her RAI treatment, needing levothyroxine. - she is on a beta blocker for tremors; these are chronic - She refused to join my chart - We will check her TFTs today - I will see her back in 6 months, if normal >> will schduel next appt 1 year away, then see PCP annual - flu shot today  Office Visit on 03/13/2017  Component Date Value Ref Range Status  . TSH 03/13/2017 2.43  0.35 - 4.50 uIU/mL Final  . Free T4 03/13/2017 0.78  0.60 - 1.60 ng/dL Final   Comment: Specimens from patients who are undergoing biotin therapy and /or ingesting biotin supplements may contain high levels of biotin.  The higher biotin concentration in these specimens interferes with this Free T4 assay.  Specimens that contain high levels  of biotin may cause false high results for this Free T4 assay.  Please interpret results in light of the total clinical presentation of the patient.    . T3, Free 03/13/2017 3.3   2.3 - 4.2 pg/mL Final   TFTs still normal, no intervention needed. [name], MD PhD [facility]

## 2017-03-13 NOTE — Patient Instructions (Signed)
Please stop at the lab.  Please come back for a follow-up appointment in 6 months.  

## 2017-03-14 ENCOUNTER — Telehealth: Payer: Self-pay

## 2017-03-14 NOTE — Telephone Encounter (Signed)
-----   Message from [name], MD sent at 03/13/2017  2:53 PM EST ----- Catherine Lowery, can you please call pt: TFTs still normal, no intervention needed.

## 2017-03-14 NOTE — Telephone Encounter (Signed)
Called patient and gave lab results. Patient had no questions or concerns.  

## 2017-03-30 ENCOUNTER — Ambulatory Visit: Payer: Medicare HMO | Admitting: Internal Medicine

## 2017-03-30 ENCOUNTER — Encounter: Payer: Self-pay | Admitting: Internal Medicine

## 2017-03-30 ENCOUNTER — Encounter: Payer: Self-pay | Admitting: *Deleted

## 2017-03-30 VITALS — BP 124/76 | HR 64 | Ht 69.5 in | Wt 182.0 lb

## 2017-03-30 DIAGNOSIS — I10 Essential (primary) hypertension: Secondary | ICD-10-CM | POA: Diagnosis not present

## 2017-03-30 DIAGNOSIS — R5383 Other fatigue: Secondary | ICD-10-CM

## 2017-03-30 DIAGNOSIS — E785 Hyperlipidemia, unspecified: Secondary | ICD-10-CM | POA: Diagnosis not present

## 2017-03-30 MED ORDER — ATORVASTATIN CALCIUM 40 MG PO TABS: 40.0000 mg | ORAL_TABLET | Freq: Every day | ORAL | 3 refills | Status: DC

## 2017-03-30 NOTE — Patient Instructions (Signed)
Your physician wants you to follow-up in: 1 year with Dr. [name]. You will receive a reminder letter in the mail two months in advance. If you don't receive a letter, please call our office to schedule the follow-up appointment.  

## 2017-04-02 ENCOUNTER — Encounter: Payer: Self-pay | Admitting: Internal Medicine

## 2017-04-02 DIAGNOSIS — R5383 Other fatigue: Secondary | ICD-10-CM | POA: Insufficient documentation

## 2017-04-02 NOTE — Progress Notes (Signed)
OFFICE NOTE  Chief Complaint:  Routine follow-up, fatigue  Primary Care Physician: Alycia Rossetti, MD  HPI:  Catherine Lowery returns today for hospital follow-up. She presented with atypical chest pain symptoms. She ruled-out for ACS and underwent a myoview, which was negative for ischemia, however, the EF was reduced to 47%. It should also be noted, that she was found to have a suppressed TSH at 0.045.  She has had no treatment for this. She reports no further chest pain today. She denies worsening shortness of breath. She has had worsening tremor. She gets hot flashes a lot and has been sweaty.   Catherine Lowery returns today for follow-up. Overall she feels well. She denies any significant palpitations on propranolol. She continues to take aspirin 325 mg daily. Blood pressure is well-controlled. She is seeing Dr. Cruzita Lederer for workup of a suppressed TSH however T3 and T4 have been fairly normal. Laboratory work for graves disease and other possible etiologies are still in the works. She's not currently on any medication.  03/29/2016  Catherine Lowery was seen today in follow-up. Overall she feels well. She recently saw her primary care provider who noted her total cholesterol was elevated 209 with LDL of 125. EKG shows sinus bradycardia with nonspecific T-wave changes. Blood pressure is at goal today. She has follow-up with her endocrinologist in December for further monitoring of her hyperthyroidism.  03/30/2017  Catherine Lowery returns today for follow-up.  This is a routine annual visit.  She denies any chest pain or worsening shortness of breath.  Lab work in July 2018 showed total cholesterol 148, triglycerides 156, HDL 30 and LDL 87, an improvement from an LDL of 121 about 2 years ago.  She is on atorvastatin 40 mg daily.   PMHx:  Past Medical History:  Diagnosis Date  . DJD (degenerative joint disease)    LEFT HIP  . Hyperlipidemia    takes Simvasatin daily  . Hypertension    takes Propranolol and HCTZ daioly  . Hyperthyroidism   . Pneumonia 10/2015  . PONV (postoperative nausea and vomiting)   . Shortness of breath dyspnea    with ambulation  . Tremors of nervous system    takes Primidone daily    Past Surgical History:  Procedure Laterality Date  . BOne Spur     left heel  . COLONOSCOPY    . JOINT REPLACEMENT Bilateral    knee  . KNEE SURGERY    . ROTATOR CUFF REPAIR Bilateral   . 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 TOTAL HIP ARTHROPLASTY Left 12/29/2015   Procedure: TOTAL HIP ARTHROPLASTY ANTERIOR APPROACH;  Surgeon: Ninetta Lights, MD;  Location: [facility];  Service: Orthopedics;  Laterality: Left;    FAMHx:  Family History  Problem Relation Age of Onset  . Hypertension Mother   . Heart disease Mother   . Stroke Mother   . Cancer Father     SOCHx:   reports that she has been smoking.  She has a 10.00 pack-year smoking history. she has never used smokeless tobacco. She reports that she does not drink alcohol or use drugs.  ALLERGIES:  Allergies  Allergen Reactions  . Morphine And Related Itching  . Other Itching and Nausea And Vomiting    "PAIN MEDICINES"  . Percocet [Oxycodone-Acetaminophen] Nausea And Vomiting    ROS: Pertinent items noted in HPI and remainder of comprehensive ROS otherwise negative.  HOME MEDS: Current Outpatient Medications  Medication Sig Dispense Refill  .  aspirin 325 MG tablet Take 325 mg by mouth daily. Stop date 01/30/16    . hydrochlorothiazide (MICROZIDE) 12.5 MG capsule Take 12.5 mg by mouth every morning.    . primidone (MYSOLINE) 50 MG tablet Take 2 tablets (100 mg total) by mouth every morning. 180 tablet 4  . propranolol ER (INDERAL LA) 80 MG 24 hr capsule Take 1 capsule (80 mg total) by mouth daily. 90 capsule 4  . Vitamin D, Ergocalciferol, (DRISDOL) 50000 UNITS CAPS Take 50,000 Units by mouth every 7 (seven) days. sundays    . VOLTAREN 1 % GEL APPLY TOPICALLY 4 TIMES DAILY AS NEEDED 100 g 0    . atorvastatin (LIPITOR) 40 MG tablet Take 1 tablet (40 mg total) by mouth daily. 90 tablet 3   No current facility-administered medications for this visit.     LABS/IMAGING: No results found for this or any previous visit (from the past 48 hour(s)). No results found.  WEIGHTS: Wt Readings from Last 3 Encounters:  03/30/17 182 lb (82.6 kg)  03/13/17 185 lb (83.9 kg)  11/20/16 182 lb (82.6 kg)    VITALS: BP 124/76   Pulse 64   Ht 5' 9.5" (1.765 m)   Wt 182 lb (82.6 kg)   LMP 01/24/2000   BMI 26.49 kg/m   EXAM: General appearance: alert and no distress Neck: no carotid bruit, no JVD and thyroid: enlarged Lungs: clear to auscultation bilaterally Heart: regular rate and rhythm, S1, S2 normal, no murmur, click, rub or gallop Abdomen: soft, non-tender; bowel sounds normal; no masses,  no organomegaly Extremities: extremities normal, atraumatic, no cyanosis or edema Pulses: 2+ and symmetric Skin: Skin color, texture, turgor normal. No rashes or lesions Neurologic: Mental status: Alert, oriented, thought content appropriate, fine motor tremor is noted Psych: Pleasant  EKG: Normal sinus rhythm at 64, nonspecific T wave changes-personally reviewed  ASSESSMENT: 1. Atypical chest pain - negative myoview for ischemia 2. Normal LV function by echo, however EF 47% by Myoview (suspect gating abnormality) -12/2014, LVEF 55-60% by echo (01/2015) 3. Subclinical hyperthyroidism - recent radioactive iodine ablation, not on thyroid replacement 4. Dyslipidemia  PLAN: 1.   Catherine Lowery seems to be doing well denies any chest pain.  She had a negative Myoview in 2016 with normal LV function on echo.  She does have subclinical hyperthyroidism and had radioactive iodine ablation, but reports not being on thyroid replacement.  Recent TSH, free T3 and T4 were within normal limits.  This does not likely explain her fatigue, but it is more likely multifactorial.  No recommended changes on her  medications.  Follow-up with me annually or sooner as necessary.  Pixie Casino, MD, [facility], [place] Director of the Advanced Lipid Disorders &  Cardiovascular Risk Reduction Clinic Attending Cardiologist  Direct Dial: [phone]  Fax: [phone]  Website:  [email or web address] 04/02/2017, 2:17 PM

## 2017-05-18 ENCOUNTER — Ambulatory Visit: Payer: Medicare HMO | Admitting: Family Medicine

## 2017-05-22 ENCOUNTER — Encounter: Payer: Self-pay | Admitting: *Deleted

## 2017-05-22 ENCOUNTER — Encounter: Payer: Self-pay | Admitting: Family Medicine

## 2017-05-22 ENCOUNTER — Other Ambulatory Visit: Payer: Self-pay

## 2017-05-22 ENCOUNTER — Ambulatory Visit (INDEPENDENT_AMBULATORY_CARE_PROVIDER_SITE_OTHER): Payer: Medicare HMO | Admitting: Family Medicine

## 2017-05-22 VITALS — BP 122/62 | HR 90 | Temp 98.0°F | Resp 16 | Ht 69.0 in | Wt 182.0 lb

## 2017-05-22 DIAGNOSIS — Z1159 Encounter for screening for other viral diseases: Secondary | ICD-10-CM | POA: Diagnosis not present

## 2017-05-22 DIAGNOSIS — Z0000 Encounter for general adult medical examination without abnormal findings: Secondary | ICD-10-CM

## 2017-05-22 DIAGNOSIS — E782 Mixed hyperlipidemia: Secondary | ICD-10-CM

## 2017-05-22 DIAGNOSIS — Z66 Do not resuscitate: Secondary | ICD-10-CM

## 2017-05-22 DIAGNOSIS — E559 Vitamin D deficiency, unspecified: Secondary | ICD-10-CM | POA: Diagnosis not present

## 2017-05-22 DIAGNOSIS — I10 Essential (primary) hypertension: Secondary | ICD-10-CM | POA: Diagnosis not present

## 2017-05-22 DIAGNOSIS — G250 Essential tremor: Secondary | ICD-10-CM

## 2017-05-22 DIAGNOSIS — Z720 Tobacco use: Secondary | ICD-10-CM

## 2017-05-22 MED ORDER — VITAMIN D (ERGOCALCIFEROL) 1.25 MG (50000 UNIT) PO CAPS: 50000.0000 [IU] | ORAL_CAPSULE | ORAL | 0 refills | Status: DC

## 2017-05-22 MED ORDER — HYDROCHLOROTHIAZIDE 12.5 MG PO CAPS: 12.5000 mg | ORAL_CAPSULE | Freq: Every morning | ORAL | 2 refills | Status: DC

## 2017-05-22 NOTE — Patient Instructions (Addendum)
F/U 6 months  We will call with results  

## 2017-05-22 NOTE — Progress Notes (Signed)
Subjective:   Patient presents for Medicare Annual/Subsequent preventive examination.   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 No Over the past two weeks, have you felt little interest or pleasure in doing things? No Have you lost interest or pleasure in daily life? No Do you often feel hopeless? No Do you cry easily over simple problems? No   Activities of Daily Living  In your present state of health, do you have any difficulty performing the following activities?:  Driving? No  Managing money? No  Feeding yourself? No  Getting from bed to chair? No  Climbing a flight of stairs? No  Preparing food and eating?: No  Bathing or showering? No  Getting dressed: No  Getting to the toilet? No  Using the toilet:No  Moving around from place to place: No  In the past year have you fallen or had a near fall?:No  Are you sexually active? No  Do you have more than one partner? No   Hearing Difficulties: No  Do you often ask people to speak up or repeat themselves? No  Do you experience ringing or noises in your ears? No Do you have difficulty understanding soft or whispered voices? No  Do you feel that you have a problem with memory? No Do you often misplace items? No  Do you feel safe at home? Yes  Cognitive Testing  Alert? Yes Normal Appearance?Yes  Oriented to person? Yes Place? Yes  Time? Yes  Recall of three  objects? Yes  Can perform simple calculations? Yes  Displays appropriate judgment?Yes  Can read the correct time from a watch face?Yes   List the Names of Other Physician/Practitioners you currently use:  Cardiology, Endocrine, [facility]- [facility] for essentia tremor Nephrology- [name]- Dr. Florene Glen   Screening Tests / Date Colonoscopy       UTD 2015               Zostavax  Declines Mammogram  UTD  Influenza Vaccine UTD Tetanus/tdap Declines due to cost  Pneumonia- UTD  ROS: GEN- denies fatigue, fever, weight loss,weakness, recent illness HEENT- denies eye drainage, change in vision, nasal discharge, CVS- denies chest pain, palpitations RESP- denies SOB, cough, wheeze ABD- denies N/V, change in stools, abd pain GU- denies dysuria, hematuria, dribbling, incontinence MSK- denies joint pain, muscle aches, injury Neuro- denies headache, dizziness, syncope, seizure activity   PHYSICAL: GEN- NAD, alert and oriented x3 HEENT- PERRL, EOMI, non injected sclera, pink conjunctiva, MMM, oropharynx clear, TM clear bilat no effusion Neck- Supple, no thryomegaly CVS- RRR, no murmur RESP-CTAB ABD-NABS, Soft, NT, ND EXT- TRACE Ankle edema Pulses- Radial, DP- 2+     Assessment:    Annual wellness medicare exam   Plan:    During the course of the visit the patient was educated and counseled about appropriate screening and preventive services including:   TDAP/SHINGLES Unable to afford.  Screen Neg for depression.  Discussed tobacco cessation, smokes 1ppd- pt has patches at home she is willing to try   HTN- well controlled no changes to meds  OA- uses voltaren gel prn, stays active  Vitamin D def- dur for recheck, completed weekly vitamin D replacement, obtain Last Bone Density  She is DNR, given advanced directive to fill out as well, DNR is in record   Diet review for nutrition referral? Yes ____ Not Indicated __x__  Patient  Instructions (the written plan) was given to the patient.  Medicare Attestation  I have personally reviewed:  The patient's medical and social history  Their use of alcohol, tobacco or illicit drugs  Their current medications and supplements  The patient's functional ability including ADLs,fall risks, home safety risks, cognitive, and hearing and visual impairment  Diet and physical activities  Evidence for depression or mood disorders  The patient's weight, height, BMI, and visual acuity have been recorded in the chart. I have made referrals, counseling, and provided education to the patient based on review of the above and I have provided the patient with a written personalized care plan for preventive services.

## 2017-05-23 ENCOUNTER — Other Ambulatory Visit: Payer: Self-pay | Admitting: Family Medicine

## 2017-05-23 LAB — CBC WITH DIFFERENTIAL/PLATELET
BASOS ABS: 20 {cells}/uL (ref 0–200)
Basophils Relative: 0.4 %
EOS PCT: 4.3 %
Eosinophils Absolute: 219 cells/uL (ref 15–500)
HEMATOCRIT: 36.8 % (ref 35.0–45.0)
Hemoglobin: 11.7 g/dL (ref 11.7–15.5)
LYMPHS ABS: 1632 {cells}/uL (ref 850–3900)
MCH: 22.8 pg — AB (ref 27.0–33.0)
MCHC: 31.8 g/dL — AB (ref 32.0–36.0)
MCV: 71.7 fL — AB (ref 80.0–100.0)
MPV: 10.5 fL (ref 7.5–12.5)
Monocytes Relative: 8.5 %
NEUTROS PCT: 54.8 %
Neutro Abs: 2795 cells/uL (ref 1500–7800)
Platelets: 210 10*3/uL (ref 140–400)
RBC: 5.13 10*6/uL — ABNORMAL HIGH (ref 3.80–5.10)
RDW: 15.6 % — AB (ref 11.0–15.0)
Total Lymphocyte: 32 %
WBC: 5.1 10*3/uL (ref 3.8–10.8)
WBCMIX: 434 {cells}/uL (ref 200–950)

## 2017-05-23 LAB — COMPREHENSIVE METABOLIC PANEL
AG Ratio: 1.2 (calc) (ref 1.0–2.5)
ALKALINE PHOSPHATASE (APISO): 81 U/L (ref 33–130)
ALT: 22 U/L (ref 6–29)
AST: 21 U/L (ref 10–35)
Albumin: 4 g/dL (ref 3.6–5.1)
BILIRUBIN TOTAL: 0.4 mg/dL (ref 0.2–1.2)
BUN/Creatinine Ratio: 22 (calc) (ref 6–22)
BUN: 29 mg/dL — AB (ref 7–25)
CALCIUM: 9.5 mg/dL (ref 8.6–10.4)
CO2: 25 mmol/L (ref 20–32)
Chloride: 107 mmol/L (ref 98–110)
Creat: 1.32 mg/dL — ABNORMAL HIGH (ref 0.60–0.93)
Globulin: 3.3 g/dL (calc) (ref 1.9–3.7)
Glucose, Bld: 93 mg/dL (ref 65–99)
Potassium: 4.8 mmol/L (ref 3.5–5.3)
Sodium: 139 mmol/L (ref 135–146)
Total Protein: 7.3 g/dL (ref 6.1–8.1)

## 2017-05-23 LAB — LIPID PANEL
CHOLESTEROL: 135 mg/dL (ref <200)
HDL: 33 mg/dL — ABNORMAL LOW (ref >50)
LDL CHOLESTEROL (CALC): 76 mg/dL
Non-HDL Cholesterol (Calc): 102 mg/dL (calc) (ref <130)
TRIGLYCERIDES: 165 mg/dL — AB (ref <150)
Total CHOL/HDL Ratio: 4.1 (calc) (ref <5.0)

## 2017-05-23 LAB — HEPATITIS C ANTIBODY
HEP C AB: NONREACTIVE
SIGNAL TO CUT-OFF: 0.04 (ref <1.00)

## 2017-05-23 LAB — VITAMIN D 25 HYDROXY (VIT D DEFICIENCY, FRACTURES): VIT D 25 HYDROXY: 37 ng/mL (ref 30–100)

## 2017-05-24 ENCOUNTER — Other Ambulatory Visit: Payer: Self-pay | Admitting: *Deleted

## 2017-05-24 MED ORDER — VITAMIN D3 50 MCG (2000 UT) PO TABS: 1.0000 | ORAL_TABLET | Freq: Every day | ORAL | 11 refills | Status: DC

## 2017-05-24 MED ORDER — ATORVASTATIN CALCIUM 40 MG PO TABS: 40.0000 mg | ORAL_TABLET | Freq: Every day | ORAL | 3 refills | Status: DC

## 2017-05-24 MED ORDER — HYDROCHLOROTHIAZIDE 12.5 MG PO CAPS: 12.5000 mg | ORAL_CAPSULE | Freq: Every morning | ORAL | 2 refills | Status: DC

## 2017-05-24 MED ORDER — PRIMIDONE 50 MG PO TABS: 100.0000 mg | ORAL_TABLET | Freq: Every morning | ORAL | 4 refills | Status: DC

## 2017-06-06 ENCOUNTER — Ambulatory Visit: Payer: Medicare HMO | Admitting: Diagnostic Neuroimaging

## 2017-06-16 ENCOUNTER — Other Ambulatory Visit: Payer: Self-pay | Admitting: Diagnostic Neuroimaging

## 2017-06-19 ENCOUNTER — Telehealth: Payer: Self-pay | Admitting: Family Medicine

## 2017-06-19 NOTE — Telephone Encounter (Signed)
Patient has several questions regarding her medications would like a call back asap  [phone]

## 2017-06-20 NOTE — Telephone Encounter (Signed)
Call placed to patient to inquire.   Reports that she has prescription for [facility]atin at [facility], but she usually gets her routine medications from mail order. Inquired as to if they are the same. Call placed to [facility] and was advised prescription matches her current list. Patient made aware and states that she does not need them at this time.

## 2017-07-03 ENCOUNTER — Ambulatory Visit: Payer: Medicare HMO | Admitting: Diagnostic Neuroimaging

## 2017-07-03 ENCOUNTER — Encounter: Payer: Self-pay | Admitting: Diagnostic Neuroimaging

## 2017-07-03 VITALS — BP 122/66 | HR 56 | Ht 69.0 in | Wt 187.0 lb

## 2017-07-03 DIAGNOSIS — G250 Essential tremor: Secondary | ICD-10-CM | POA: Diagnosis not present

## 2017-07-03 NOTE — Progress Notes (Signed)
[facility]  PATIENT: Catherine Lowery DOB: 10/14/1945  REFERRING CLINICIAN: K Hosston HISTORY FROM: patient  REASON FOR VISIT: follow up   HISTORICAL  CHIEF COMPLAINT:  Chief Complaint  Patient presents with  . Follow-up  . Tremors    some days worse then others    HISTORY OF PRESENT ILLNESS:   UPDATE (06/06/17, VRP): Since last visit, doing well. Tolerating meds. No alleviating or aggravating factors. Tremor stable.   PRIOR HPI (06/06/16, VRP): 72 year old female here for evaluation of essential tremor.   Patient had onset of tremor at age 89 years old. She's had trouble throughout her life.   Age 40 results she was diagnosed with essential tremor.   At age 9 years old she started on propranolol.   At age 55 year old she was started on primidone.  Patient has multiple family members on her father's side who also have essential tremor. Left hand is more affected than right hand and when she is picking up objects or hold her arms out. Medications have helped her tremor. Patient was seeing another neurologist in [place] but now trying to establish with local neurologist in [place].   REVIEW OF SYSTEMS: Full 14 system review of systems performed and negative with exception of: tremor.  ALLERGIES: Allergies  Allergen Reactions  . Morphine And Related Itching  . Other Itching and Nausea And Vomiting    "PAIN MEDICINES"  . Percocet [Oxycodone-Acetaminophen] Nausea And Vomiting    HOME MEDICATIONS: Outpatient Medications Prior to Visit  Medication Sig Dispense Refill  . aspirin 325 MG tablet Take 325 mg by mouth daily. Stop date 01/30/16    . atorvastatin (LIPITOR) 40 MG tablet Take 1 tablet (40 mg total) by mouth daily. 90 tablet 3  . Cholecalciferol (VITAMIN D3) 2000 units TABS Take 1 tablet by mouth daily. 30 tablet 11  . hydrochlorothiazide (MICROZIDE) 12.5 MG capsule Take 1 capsule (12.5 mg total) by mouth every morning. 90 capsule 2  .  primidone (MYSOLINE) 50 MG tablet Take 2 tablets (100 mg total) by mouth every morning. 180 tablet 4  . propranolol ER (INDERAL LA) 80 MG 24 hr capsule TAKE 1 CAPSULE BY MOUTH EVERY DAY 90 capsule 3  . VOLTAREN 1 % GEL APPLY TOPICALLY 4 TIMES DAILY AS NEEDED 100 g 0   No facility-administered medications prior to visit.     PAST MEDICAL HISTORY: Past Medical History:  Diagnosis Date  . DJD (degenerative joint disease)    LEFT HIP  . Hyperlipidemia    takes Simvasatin daily  . Hypertension    takes Propranolol and HCTZ daioly  . Hyperthyroidism   . Pneumonia 10/2015  . PONV (postoperative nausea and vomiting)   . Shortness of breath dyspnea    with ambulation  . Tremors of nervous system    takes Primidone daily    PAST SURGICAL HISTORY: Past Surgical History:  Procedure Laterality Date  . BOne Spur     left heel  . COLONOSCOPY    . JOINT REPLACEMENT Bilateral    knee  . KNEE SURGERY    . ROTATOR CUFF REPAIR Bilateral   . 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 TOTAL HIP ARTHROPLASTY Left 12/29/2015   Procedure: TOTAL HIP ARTHROPLASTY ANTERIOR APPROACH;  Surgeon: Ninetta Lights, MD;  Location: [facility];  Service: Orthopedics;  Laterality: Left;    FAMILY HISTORY: Family History  Problem Relation Age of Onset  . Hypertension Mother   . Heart disease Mother   .  Stroke Mother   . Cancer Father     SOCIAL HISTORY:  Social History   Socioeconomic History  . Marital status: Married    Spouse name: Not on file  . Number of children: 1  . Years of education: 68  . Highest education level: Not on file  Social Needs  . Financial resource strain: Not on file  . Food insecurity - worry: Not on file  . Food insecurity - inability: Not on file  . Transportation needs - medical: Not on file  . Transportation needs - non-medical: Not on file  Occupational History    Comment: retired  Tobacco Use  . Smoking status: Current Every Day Smoker    Packs/day: 0.50     Years: 20.00    Pack years: 10.00  . Smokeless tobacco: Never Used  . Tobacco comment: 06/06/16 Pt stated "I'm trying to quit"  Substance and Sexual Activity  . Alcohol use: No    Alcohol/week: 0.0 oz  . Drug use: No  . Sexual activity: Not Currently  Other Topics Concern  . Not on file  Social History Narrative   Lives with handicapped son   Caffeine- coffee, 2 cups daily, 1-2 sodas daily     PHYSICAL EXAM  GENERAL EXAM/CONSTITUTIONAL: Vitals:  Vitals:   07/03/17 0837  BP: 122/66  Pulse: (!) 56  Weight: 187 lb (84.8 kg)  Height: 5\' 9"  (1.753 m)   Body mass index is 27.62 kg/m. No exam data present  Patient is in no distress; well developed, nourished and groomed; neck is supple  CARDIOVASCULAR:  Examination of carotid arteries is normal; no carotid bruits  Regular rate and rhythm, no murmurs  Examination of peripheral vascular system by observation and palpation is normal  EYES:  Ophthalmoscopic exam of optic discs and posterior segments is normal; no papilledema or hemorrhages  MUSCULOSKELETAL:  Gait, strength, tone, movements noted in Neurologic exam below  NEUROLOGIC: MENTAL STATUS:  No flowsheet data found.  awake, alert, oriented to person, place and time  recent and remote memory intact  normal attention and concentration  language fluent, comprehension intact, naming intact,   fund of knowledge appropriate  CRANIAL NERVE:   2nd - no papilledema on fundoscopic exam  2nd, 3rd, 4th, 6th - pupils equal and reactive to light, visual fields full to confrontation, extraocular muscles intact, no nystagmus  5th - facial sensation symmetric  7th - facial strength symmetric  8th - hearing intact  9th - palate elevates symmetrically, uvula midline  11th - shoulder shrug symmetric  12th - tongue protrusion midline  MOTOR:   normal bulk and tone, full strength in the BUE, BLE  MILD POSTURAL TREMOR IN LUE > RUE  MILD HEAD TREMOR  TREMOR  WITH HANDWRITING  SENSORY:   normal and symmetric to light touch, temperature, vibration  COORDINATION:   finger-nose-finger --> MODERATE INTENTION TREMOR (L > R)  fine finger movements and tapping normal  REFLEXES:   deep tendon reflexes --> BUE 2; KNEES TRACE; ANKLES TRACE  GAIT/STATION:   narrow based gait; able to walk tandem     DIAGNOSTIC DATA (LABS, IMAGING, TESTING) - I reviewed patient records, labs, notes, testing and imaging myself where available.  Lab Results  Component Value Date   WBC 5.1 05/22/2017   HGB 11.7 05/22/2017   HCT 36.8 05/22/2017   MCV 71.7 (L) 05/22/2017   PLT 210 05/22/2017      Component Value Date/Time   NA 139 05/22/2017 1034  K 4.8 05/22/2017 1034   CL 107 05/22/2017 1034   CO2 25 05/22/2017 1034   GLUCOSE 93 05/22/2017 1034   BUN 29 (H) 05/22/2017 1034   CREATININE 1.32 (H) 05/22/2017 1034   CALCIUM 9.5 05/22/2017 1034   PROT 7.3 05/22/2017 1034   ALBUMIN 4.0 11/20/2016 1607   AST 21 05/22/2017 1034   ALT 22 05/22/2017 1034   ALKPHOS 82 11/20/2016 1607   BILITOT 0.4 05/22/2017 1034   GFRNONAA 46 (L) 12/31/2015 0426   GFRAA 54 (L) 12/31/2015 0426   Lab Results  Component Value Date   CHOL 135 05/22/2017   HDL 33 (L) 05/22/2017   LDLCALC 87 11/20/2016   TRIG 165 (H) 05/22/2017   CHOLHDL 4.1 05/22/2017   Lab Results  Component Value Date   HGBA1C 5.5 05/16/2016   No results found for: VITAMINB12 Lab Results  Component Value Date   TSH 2.43 03/13/2017        ASSESSMENT AND PLAN  72 y.o. year old female here with essential tremor, well controlled on propranolol and primidone.   Dx:  1. Essential tremor      PLAN:  I spent 15 minutes of face to face time with patient. Greater than 50% of time was spent in counseling and coordination of care with patient. In summary we discussed:   - continue primidone 100mg  daily  - continue propranolol LA 80mg  daily  - follow up with endocrinology (Dr. Renne Crigler)  re: thyroid goiter  - may follow up with neurology as needed (will request PCP to refill primidone and propranolol)  Return if symptoms worsen or fail to improve, for return to PCP.    Penni Bombard, MD 8/0/0349, 1:79 AM Certified in Neurology, Neurophysiology and Neuroimaging  [facility] [address], [place], [place] [address] [phone]

## 2017-08-06 ENCOUNTER — Ambulatory Visit (INDEPENDENT_AMBULATORY_CARE_PROVIDER_SITE_OTHER): Payer: Medicare HMO | Admitting: Physician Assistant

## 2017-08-06 ENCOUNTER — Encounter: Payer: Self-pay | Admitting: Physician Assistant

## 2017-08-06 ENCOUNTER — Encounter: Payer: Self-pay | Admitting: Family Medicine

## 2017-08-06 VITALS — BP 118/78 | HR 86 | Temp 98.2°F | Resp 16 | Ht 69.5 in | Wt 179.4 lb

## 2017-08-06 DIAGNOSIS — B349 Viral infection, unspecified: Secondary | ICD-10-CM

## 2017-08-06 NOTE — Progress Notes (Signed)
Patient ID: Catherine Lowery MRN: [id number], DOB: 1946/01/06, 72 y.o. Date of Encounter: 08/06/2017, 4:27 PM    Chief Complaint:  Chief Complaint  Patient presents with  . patient was out of work and is requesting a note for work     HPI: 72 y.o. year old female presents with above.   She reports that symptoms started Wednesday, 07/31/16.  At that time she developed some chills, runny nose, body aches. Also was having some congestion. Also throat was scratchy.  Says that she was in bed Wednesday through Sunday.  Says that yesterday she continued to feel weak and a little bit lightheaded and continued with body aches.   Had used some Mucinex Sudafed and Vicks.  Congestion symptoms seem to be improving and even her body aches and weakness seems somewhat improved.  She has had no fever since the beginning of the illness.  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 She reports that she got sick and had to call out and was out of work Friday Saturday and Sunday nights.  Is not scheduled to return back to work until this upcoming Friday. Needs note to turn into work.       Home Meds:   Outpatient Medications Prior to Visit  Medication Sig Dispense Refill  . aspirin 325 MG tablet Take 325 mg by mouth daily. Stop date 01/30/16    . atorvastatin (LIPITOR) 40 MG tablet Take 1 tablet (40 mg total) by mouth daily. 90 tablet 3  . Cholecalciferol (VITAMIN D3) 2000 units TABS Take 1 tablet by mouth daily. 30 tablet 11  . hydrochlorothiazide (MICROZIDE) 12.5 MG capsule Take 1 capsule (12.5 mg total) by mouth every morning. 90 capsule 2  . primidone (MYSOLINE) 50 MG tablet Take 2 tablets (100 mg total) by mouth every morning. 180 tablet 4  . propranolol ER (INDERAL LA) 80 MG 24 hr capsule TAKE 1 CAPSULE BY MOUTH EVERY DAY 90 capsule 3  . VOLTAREN 1 % GEL APPLY TOPICALLY 4 TIMES DAILY AS NEEDED 100 g 0   No  facility-administered medications prior to visit.     Allergies:  Allergies  Allergen Reactions  . Morphine And Related Itching  . Other Itching and Nausea And Vomiting    "PAIN MEDICINES"  . Percocet [Oxycodone-Acetaminophen] Nausea And Vomiting      Review of Systems: See HPI for pertinent ROS. All other ROS negative.    Physical Exam: Blood pressure 118/78, pulse 86, temperature 98.2 F (36.8 C), temperature source Oral, resp. rate 16, height 5' 9.5" (1.765 m), weight 81.4 kg (179 lb 6.4 oz), last menstrual period 01/24/2000, SpO2 97 %., Body mass index is 26.11 kg/m. General:  WNWD AAF. Appears in no acute distress. HEENT: Normocephalic, atraumatic, eyes without discharge, sclera non-icteric, nares are without discharge. Bilateral auditory canals clear, TM's are without perforation, pearly grey and translucent with reflective cone of light bilaterally. Oral cavity moist, posterior pharynx without exudate, erythema, peritonsillar abscess.  Neck: Supple. No thyromegaly. No lymphadenopathy. Lungs: Clear bilaterally to auscultation without wheezes, rales, or rhonchi. Breathing is unlabored. Heart: Regular rhythm. No murmurs, rubs, or gallops. Msk:  Strength and tone normal for age. Extremities/Skin: Warm and dry.  Neuro: Alert and oriented X 3. Moves all extremities spontaneously. Gait is normal. CNII-XII grossly in tact. Psych:  Responds to questions appropriately with a normal affect.     ASSESSMENT AND PLAN:  72 y.o. year old female with  1. Viral illness Symptoms are improved and resolving.  Note given to cover her missing work with plans to return to work this upcoming Friday.  Follow-up if needed.   [address] [place], [place], [facility] 08/06/2017 4:27 PM

## 2017-09-10 ENCOUNTER — Encounter: Payer: Self-pay | Admitting: Internal Medicine

## 2017-09-10 ENCOUNTER — Ambulatory Visit: Payer: Medicare HMO | Admitting: Internal Medicine

## 2017-09-10 VITALS — BP 114/64 | HR 73 | Ht 69.5 in | Wt 180.2 lb

## 2017-09-10 DIAGNOSIS — E0520 Thyrotoxicosis with toxic multinodular goiter without thyrotoxic crisis or storm: Secondary | ICD-10-CM | POA: Diagnosis not present

## 2017-09-10 LAB — T4, FREE: Free T4: 0.76 ng/dL (ref 0.60–1.60)

## 2017-09-10 LAB — TSH: TSH: 2.27 u[IU]/mL (ref 0.35–4.50)

## 2017-09-10 LAB — T3, FREE: T3 FREE: 3.3 pg/mL (ref 2.3–4.2)

## 2017-09-10 NOTE — Progress Notes (Signed)
Patient ID: Catherine Lowery, female   DOB: 20-Oct-1945, 72 y.o.   MRN: [id number]   HPI  Catherine Lowery is a 72 y.o.-year-old female, initially referred by her cardiologist, Dr. Debara Pickett, returning for f/u for Toxic MNG, with subclinical thyrotoxicosis, now s/p RAI tx. Last visit 6 mo ago.  She had the flu in 07/2017.  She is still recovering from this.  Reviewed and addended hx: Pt was found to have a low TSH at the time of a recent hospitalization for CP in 12/2014. She r/o for an AMI. She had a follow up visit with Dr Debara Pickett >> a TSH was repeated >> better, but still low.   Thyroid Uptake and scan (11/30/2015) report reviewed:  The 24 hour radioactive iodine uptake is equal to 21.7%.  Heterogeneous tracer activity is identified within both lobes of the thyroid gland. Relative areas of decreased uptake localizing to the upper pole of the left lobe, mid right lobe and upper pole of right lobe is noted.  IMPRESSION: 1. 24 hour radioactive iodine uptake is within normal limits at 21.7%. 2. Multi nodular thyroid gland.      We next obtained a thyroid U/S (12/09/2015) to investigate the upper "cold" thyroid nodules - however, these were small, while the dominant nodules appeared "hot" >> we did not have to biopsy them: Right thyroid lobe: 5.9 x 2.0 x 2.4 cm. Multiple nodules are scattered throughout the right lobe.  1.3 x 0.7 x 0.9 cm mid lobe solid nodule. 1.7 x 1.3 x 0.8 cm upper pole solid nodule.  1.9 x 0.9 x 1.2 cm lower pole nodule.  Other scattered smaller nodules are noted. Left thyroid lobe: 5.6 x 1.5 x 2.0 cm. Multiple solid nodules are scattered throughout the left lobe.  1.0 x 1.1 x 1.4 cm mid lobe nodule.  2.1 x 0.9 x 1.2 cm lower pole nodule.  1.4 x 1.0 x 1.1 cm upper pole nodule.  Other smaller nodules are noted. Isthmus Thickness: 0.4 cm.  Small subcentimeter nodules are present. Lymphadenopathy: None visualized.  Pt had RAI tx (02/24/2016): 29.4 mCi    TFTs  normalized after her RAI tx,  without the need of levothyroxine.  Reviewed TFTs: Lab Results  Component Value Date   TSH 2.43 03/13/2017   TSH 1.89 11/10/2016   TSH 1.80 07/04/2016   TSH 0.07 (L) 04/05/2016   TSH 0.04 (L) 11/15/2015   TSH 0.33 (L) 06/30/2015   TSH 0.20 (L) 03/02/2015   TSH 0.198 (L) 02/04/2015   TSH 0.045 (L) 01/12/2015   FREET4 0.78 03/13/2017   FREET4 0.67 11/10/2016   FREET4 0.76 07/04/2016   FREET4 0.93 04/05/2016   FREET4 0.93 11/15/2015   FREET4 0.85 06/30/2015   FREET4 0.78 03/02/2015   FREET4 0.91 02/04/2015    Graves Ab's were not high: Lab Results  Component Value Date   TSI 51 03/02/2015   Pt mentions: - + tremors - chronic, familial.  She sees neurology (Dr. Amalia Hailey in [place]).  She is on propranolol and primidone.  Pt denies: - feeling nodules in neck - dysphagia - choking - SOB with lying down  She has: - hoarseness -from cigarettes.Trying to quit smoking: cut down from 1PPD to 1/2 PPD.    Pt has FH of thyroid cancer In sister. No FH of thyroid cancer. No h/o radiation tx to head or neck.  No seaweed or kelp. No recent contrast studies. No herbal supplements. No Biotin use. No recent steroids use.   ROS:  Constitutional: no weight gain/+ intentional 5 pound weight loss since last visit, no fatigue, no subjective hyperthermia, no subjective hypothermia Eyes: no blurry vision, no xerophthalmia ENT: no sore throat, + see HPI Cardiovascular: no CP/no SOB/no palpitations/+ leg swelling Respiratory: + cough (cigarettes)/no SOB/no wheezing Gastrointestinal: no N/no V/no D/no C/no acid reflux Musculoskeletal: no muscle aches/no joint aches Skin: no rashes, no hair loss Neurological: + tremors/no numbness/no tingling/no dizziness  I reviewed pt's medications, allergies, PMH, social hx, family hx, and changes were documented in the history of present illness. Otherwise, unchanged from my initial visit note.  Past Medical History:    Diagnosis Date  . DJD (degenerative joint disease)    LEFT HIP  . Hyperlipidemia    takes Simvasatin daily  . Hypertension    takes Propranolol and HCTZ daioly  . Hyperthyroidism   . Pneumonia 10/2015  . PONV (postoperative nausea and vomiting)   . Shortness of breath dyspnea    with ambulation  . Tremors of nervous system    takes Primidone daily   Past Surgical History:  Procedure Laterality Date  . BOne Spur     left heel  . COLONOSCOPY    . JOINT REPLACEMENT Bilateral    knee  . KNEE SURGERY    . ROTATOR CUFF REPAIR Bilateral   . 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 TOTAL HIP ARTHROPLASTY Left 12/29/2015   Procedure: TOTAL HIP ARTHROPLASTY ANTERIOR APPROACH;  Surgeon: Ninetta Lights, MD;  Location: [facility];  Service: Orthopedics;  Laterality: Left;   Social History   Social History  . Marital Status: Married    Spouse Name: N/A  . Number of Children: 1   Occupational History   retired   Social History Main Topics  . Smoking status: Current Every Day Smoker -- 0.50 packs/day for 20 years  . Smokeless tobacco: Not on file  . Alcohol Use: No  . Drug Use: No   Current Outpatient Medications on File Prior to Visit  Medication Sig Dispense Refill  . aspirin 325 MG tablet Take 325 mg by mouth daily. Stop date 01/30/16    . Cholecalciferol (VITAMIN D3) 2000 units TABS Take 1 tablet by mouth daily. 30 tablet 11  . hydrochlorothiazide (MICROZIDE) 12.5 MG capsule Take 1 capsule (12.5 mg total) by mouth every morning. 90 capsule 2  . primidone (MYSOLINE) 50 MG tablet Take 2 tablets (100 mg total) by mouth every morning. 180 tablet 4  . propranolol ER (INDERAL LA) 80 MG 24 hr capsule TAKE 1 CAPSULE BY MOUTH EVERY DAY 90 capsule 3  . VOLTAREN 1 % GEL APPLY TOPICALLY 4 TIMES DAILY AS NEEDED 100 g 0  . atorvastatin (LIPITOR) 40 MG tablet Take 1 tablet (40 mg total) by mouth daily. 90 tablet 3   No current facility-administered medications on file prior to visit.     Allergies  Allergen Reactions  . Morphine And Related Itching  . Other Itching and Nausea And Vomiting    "PAIN MEDICINES"  . Percocet [Oxycodone-Acetaminophen] Nausea And Vomiting   Family History  Problem Relation Age of Onset  . Hypertension Mother   . Heart disease Mother   . Stroke Mother   . Cancer Father   + see HPI  PE: BP 114/64   Pulse 73   Ht 5' 9.5" (1.765 m)   Wt 180 lb 3.2 oz (81.7 kg)   LMP 01/24/2000   SpO2 97%   BMI 26.23 kg/m  Body mass index is 26.23  kg/m. Wt Readings from Last 3 Encounters:  09/10/17 180 lb 3.2 oz (81.7 kg)  08/06/17 179 lb 6.4 oz (81.4 kg)  07/03/17 187 lb (84.8 kg)   Constitutional: overweight, in NAD Eyes: PERRLA, EOMI, no exophthalmos ENT: moist mucous membranes, + lumpy-bumpy thyroid, no cervical lymphadenopathy Cardiovascular: RRR, No MRG, + B LE edema  Respiratory: CTA B Gastrointestinal: abdomen soft, NT, ND, BS+ Musculoskeletal: no deformities, strength intact in all 4 Skin: moist, warm, no rashes Neurological: + tremor with outstretched hands, DTR not checked (B knee replacements)  ASSESSMENT: 1. Toxic MNG  PLAN:  1. 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  - She has no complaints at this visit other than tremors, which are chronic.  No palpitations, no unintentional weight loss (she had 5 pound intentional weight loss since last visit, mostly during the time when she had the flu), no heat intolerance, no fatigue, no hyper defecation.  We discussed that these are signs of hyper - We did again discuss about the possibility of becoming hypothyroid after her RAI treatment and needing levothyroxine - She continues on beta-blockers for tremors - She refused to join my chart - We will check TFTs today - If normal, I will see her back in a year - At next visit, if TFTs remain normal, she  may have this done by PCP at her annual physical exam  - time spent with the patient: 15 minutes, of which >50% was spent in obtaining information about her symptoms, reviewing her previous labs, evaluations, and treatments, counseling her about her condition (please see the discussed topics above), and developing a plan to further investigate it.  Office Visit on 09/10/2017  Component Date Value Ref Range Status  . TSH 09/10/2017 2.27  0.35 - 4.50 uIU/mL Final  . Free T4 09/10/2017 0.76  0.60 - 1.60 ng/dL Final   Comment: Specimens from patients who are undergoing biotin therapy and /or ingesting biotin supplements may contain high levels of biotin.  The higher biotin concentration in these specimens interferes with this Free T4 assay.  Specimens that contain high levels  of biotin may cause false high results for this Free T4 assay.  Please interpret results in light of the total clinical presentation of the patient.    . T3, Free 09/10/2017 3.3  2.3 - 4.2 pg/mL Final   TFTs are great.   [name], MD PhD [facility]

## 2017-09-10 NOTE — Patient Instructions (Signed)
Please stop at the lab.  Please come back for a follow-up appointment in 1 year.  

## 2017-09-11 DIAGNOSIS — R109 Unspecified abdominal pain: Secondary | ICD-10-CM | POA: Diagnosis not present

## 2017-09-12 ENCOUNTER — Other Ambulatory Visit: Payer: Self-pay

## 2017-09-12 ENCOUNTER — Emergency Department (HOSPITAL_COMMUNITY): Payer: Medicare HMO

## 2017-09-12 ENCOUNTER — Emergency Department (HOSPITAL_COMMUNITY)
Admission: EM | Admit: 2017-09-12 | Discharge: 2017-09-12 | Disposition: A | Discharge status: Home / Self Care | Payer: Medicare HMO | Attending: Emergency Medicine | Admitting: Emergency Medicine

## 2017-09-12 ENCOUNTER — Encounter (HOSPITAL_COMMUNITY): Payer: Self-pay | Admitting: Emergency Medicine

## 2017-09-12 DIAGNOSIS — J181 Lobar pneumonia, unspecified organism: Secondary | ICD-10-CM | POA: Insufficient documentation

## 2017-09-12 DIAGNOSIS — I10 Essential (primary) hypertension: Secondary | ICD-10-CM | POA: Insufficient documentation

## 2017-09-12 DIAGNOSIS — R0789 Other chest pain: Secondary | ICD-10-CM | POA: Diagnosis not present

## 2017-09-12 DIAGNOSIS — Z79899 Other long term (current) drug therapy: Secondary | ICD-10-CM | POA: Diagnosis not present

## 2017-09-12 DIAGNOSIS — Z96642 Presence of left artificial hip joint: Secondary | ICD-10-CM | POA: Insufficient documentation

## 2017-09-12 DIAGNOSIS — J189 Pneumonia, unspecified organism: Secondary | ICD-10-CM | POA: Diagnosis not present

## 2017-09-12 DIAGNOSIS — R05 Cough: Secondary | ICD-10-CM | POA: Diagnosis not present

## 2017-09-12 DIAGNOSIS — F17200 Nicotine dependence, unspecified, uncomplicated: Secondary | ICD-10-CM | POA: Insufficient documentation

## 2017-09-12 DIAGNOSIS — Z96653 Presence of artificial knee joint, bilateral: Secondary | ICD-10-CM | POA: Insufficient documentation

## 2017-09-12 DIAGNOSIS — Z7982 Long term (current) use of aspirin: Secondary | ICD-10-CM | POA: Insufficient documentation

## 2017-09-12 DIAGNOSIS — R1012 Left upper quadrant pain: Secondary | ICD-10-CM | POA: Diagnosis present

## 2017-09-12 LAB — COMPREHENSIVE METABOLIC PANEL
ALK PHOS: 86 U/L (ref 38–126)
ALT: 13 U/L — AB (ref 14–54)
AST: 16 U/L (ref 15–41)
Albumin: 3.6 g/dL (ref 3.5–5.0)
Anion gap: 9 (ref 5–15)
BUN: 17 mg/dL (ref 6–20)
CO2: 26 mmol/L (ref 22–32)
CREATININE: 1.29 mg/dL — AB (ref 0.44–1.00)
Calcium: 9.1 mg/dL (ref 8.9–10.3)
Chloride: 103 mmol/L (ref 101–111)
GFR calc non Af Amer: 40 mL/min — ABNORMAL LOW (ref >60)
GFR, EST AFRICAN AMERICAN: 46 mL/min — AB (ref >60)
Glucose, Bld: 112 mg/dL — ABNORMAL HIGH (ref 65–99)
Potassium: 3.9 mmol/L (ref 3.5–5.1)
SODIUM: 138 mmol/L (ref 135–145)
Total Bilirubin: 0.7 mg/dL (ref 0.3–1.2)
Total Protein: 7.2 g/dL (ref 6.5–8.1)

## 2017-09-12 LAB — CBC WITH DIFFERENTIAL/PLATELET
ABS IMMATURE GRANULOCYTES: 0 10*3/uL (ref 0.0–0.1)
BASOS ABS: 0 10*3/uL (ref 0.0–0.1)
Basophils Relative: 0 %
Eosinophils Absolute: 0.1 10*3/uL (ref 0.0–0.7)
Eosinophils Relative: 1 %
HCT: 36.1 % (ref 36.0–46.0)
HEMOGLOBIN: 11.5 g/dL — AB (ref 12.0–15.0)
Immature Granulocytes: 0 %
LYMPHS PCT: 19 %
Lymphs Abs: 1.7 10*3/uL (ref 0.7–4.0)
MCH: 22.5 pg — AB (ref 26.0–34.0)
MCHC: 31.9 g/dL (ref 30.0–36.0)
MCV: 70.8 fL — AB (ref 78.0–100.0)
MONO ABS: 1 10*3/uL (ref 0.1–1.0)
MONOS PCT: 11 %
NEUTROS ABS: 6 10*3/uL (ref 1.7–7.7)
Neutrophils Relative %: 69 %
Platelets: 192 10*3/uL (ref 150–400)
RBC: 5.1 MIL/uL (ref 3.87–5.11)
RDW: 14.6 % (ref 11.5–15.5)
WBC: 8.9 10*3/uL (ref 4.0–10.5)

## 2017-09-12 LAB — LIPASE, BLOOD: Lipase: 36 U/L (ref 11–51)

## 2017-09-12 LAB — I-STAT TROPONIN, ED: TROPONIN I, POC: 0 ng/mL (ref 0.00–0.08)

## 2017-09-12 MED ORDER — CEFTRIAXONE SODIUM 1 G IJ SOLR
1.0000 g | Freq: Once | INTRAMUSCULAR | Status: AC
  Administered 2017-09-12: 1 g via INTRAVENOUS
  Filled 2017-09-12: qty 10

## 2017-09-12 MED ORDER — AZITHROMYCIN 500 MG IV SOLR
500.0000 mg | Freq: Once | INTRAVENOUS | Status: AC
  Administered 2017-09-12: 500 mg via INTRAVENOUS
  Filled 2017-09-12: qty 500

## 2017-09-12 MED ORDER — ACETAMINOPHEN 500 MG PO TABS
1000.0000 mg | ORAL_TABLET | Freq: Once | ORAL | Status: AC
  Administered 2017-09-12: 1000 mg via ORAL
  Filled 2017-09-12: qty 2

## 2017-09-12 MED ORDER — LEVOFLOXACIN 750 MG PO TABS: 750.0000 mg | ORAL_TABLET | Freq: Every day | ORAL | 0 refills | Status: DC

## 2017-09-12 NOTE — ED Triage Notes (Signed)
Per EMS, pt coming from home with complaints of left sided abdominal pain. Pain started when she woke up this morning. Abdomen is tender to the touch. No distention or brushing noted. No back pain.

## 2017-09-12 NOTE — ED Provider Notes (Signed)
[facility] EMERGENCY DEPARTMENT Provider Note   CSN: [id number] Arrival date & time:        History   Chief Complaint Chief Complaint  Patient presents with  . Abdominal Pain    HPI Catherine Lowery is a 72 y.o. female.  Patient with past medical history remarkable for hypertension, hyperlipidemia, and hyperthyroidism presents to the emergency department with a chief complaint of left lower chest/rib pain.  She states the symptoms started this morning when she awoke.  She reports having had a cough for the past couple of days.  She denies any known fevers or chills, but is noted to have a temperature of 100 degrees in triage today.  She denies any nausea, vomiting, or diarrhea.  Denies any lower abdominal pain.  Denies any dysuria or hematuria.  She denies any other symptoms.  She has not taken anything for her symptoms.  The symptoms are worsened with palpation and with coughing.  The history is provided by the patient. No language interpreter was used.    Past Medical History:  Diagnosis Date  . DJD (degenerative joint disease)    LEFT HIP  . Hyperlipidemia    takes Simvasatin daily  . Hypertension    takes Propranolol and HCTZ daioly  . Hyperthyroidism   . Pneumonia 10/2015  . PONV (postoperative nausea and vomiting)   . Shortness of breath dyspnea    with ambulation  . Tremors of nervous system    takes Primidone daily    Patient Active Problem List   Diagnosis Date Noted  . DNR (do not resuscitate) 05/22/2017  . Other fatigue 04/02/2017  . Elevated fasting glucose 05/16/2016  . Benign familial tremor 05/16/2016  . Vitamin D deficiency 03/01/2016  . Essential tremor 03/01/2016  . Primary localized osteoarthritis of left hip 12/29/2015  . Encounter for screening colonoscopy 02/18/2015  . 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 Hyperlipidemia 01/12/2015  . Benign essential HTN 01/12/2015    . Tobacco abuse 01/12/2015  . Atypical chest pain 01/11/2015    Past Surgical History:  Procedure Laterality Date  . BOne Spur     left heel  . COLONOSCOPY    . JOINT REPLACEMENT Bilateral    knee  . KNEE SURGERY    . ROTATOR CUFF REPAIR Bilateral   . 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 TOTAL HIP ARTHROPLASTY Left 12/29/2015   Procedure: TOTAL HIP ARTHROPLASTY ANTERIOR APPROACH;  Surgeon: Ninetta Lights, MD;  Location: [facility];  Service: Orthopedics;  Laterality: Left;     OB History   None      Home Medications    Prior to Admission medications   Medication Sig Start Date End Date Taking? Authorizing Provider  aspirin 325 MG tablet Take 325 mg by mouth daily. Stop date 01/30/16    [provider]  atorvastatin (LIPITOR) 40 MG tablet Take 1 tablet (40 mg total) by mouth daily. 05/24/17 08/22/17  Alycia Rossetti, MD  Cholecalciferol (VITAMIN D3) 2000 units TABS Take 1 tablet by mouth daily. 05/24/17   [place], Modena Nunnery, MD  hydrochlorothiazide (MICROZIDE) 12.5 MG capsule Take 1 capsule (12.5 mg total) by mouth every morning. 05/24/17   Alycia Rossetti, MD  primidone (MYSOLINE) 50 MG tablet Take 2 tablets (100 mg total) by mouth every morning. 05/24/17   [place], Modena Nunnery, MD  propranolol ER (INDERAL LA) 80 MG 24 hr capsule TAKE 1 CAPSULE BY MOUTH EVERY  DAY 06/18/17   Penumalli, Earlean Polka, MD  VOLTAREN 1 % GEL APPLY TOPICALLY 4 TIMES DAILY AS NEEDED 10/16/16   Alycia Rossetti, MD    Family History Family History  Problem Relation Age of Onset  . Hypertension Mother   . Heart disease Mother   . Stroke Mother   . Cancer Father     Social History Social History   Tobacco Use  . Smoking status: Current Every Day Smoker    Packs/day: 0.50    Years: 20.00    Pack years: 10.00  . Smokeless tobacco: Never Used  . Tobacco comment: 06/06/16 Pt stated "I'm trying to quit"  Substance Use Topics  . Alcohol use: No    Alcohol/week: 0.0 oz  . Drug use: No      Allergies   Morphine and related; Other; and Percocet [oxycodone-acetaminophen]   Review of Systems Review of Systems  All other systems reviewed and are negative.    Physical Exam Updated Vital Signs Temp 100 F (37.8 C) (Oral)   Ht 5\' 9"  (1.753 m)   Wt 81.6 kg (180 lb)   LMP 01/24/2000   SpO2 97%   BMI 26.58 kg/m   Physical Exam  Constitutional: She is oriented to person, place, and time. She appears well-developed and well-nourished.  HENT:  Head: Normocephalic and atraumatic.  Eyes: Pupils are equal, round, and reactive to light. Conjunctivae and EOM are normal.  Neck: Normal range of motion. Neck supple.  Cardiovascular: Normal rate and regular rhythm. Exam reveals no gallop and no friction rub.  No murmur heard. Pulmonary/Chest: Effort normal. No respiratory distress. She has no wheezes. She has no rales. She exhibits no tenderness.  Crackles in left lower  Abdominal: Soft. Bowel sounds are normal. She exhibits no distension and no mass. There is no tenderness. There is no rebound and no guarding.  Some tenderness to palpation in the left upper quadrant and left flank  Musculoskeletal: Normal range of motion. She exhibits no edema or tenderness.  Neurological: She is alert and oriented to person, place, and time.  Skin: Skin is warm and dry.  Psychiatric: She has a normal mood and affect. Her behavior is normal. Judgment and thought content normal.  Nursing note and vitals reviewed.    ED Treatments / Results  Labs (all labs ordered are listed, but only abnormal results are displayed) Labs Reviewed  CBC WITH DIFFERENTIAL/PLATELET - Abnormal; Notable for the following components:      Result Value   Hemoglobin 11.5 (*)    MCV 70.8 (*)    MCH 22.5 (*)    All other components within normal limits  COMPREHENSIVE METABOLIC PANEL - Abnormal; Notable for the following components:   Glucose, Bld 112 (*)    Creatinine, Ser 1.29 (*)    ALT 13 (*)    GFR calc  non Af Amer 40 (*)    GFR calc Af Amer 46 (*)    All other components within normal limits  LIPASE, BLOOD  I-STAT TROPONIN, ED    EKG None  Radiology Dg Chest 2 View  Result Date: 09/12/2017 CLINICAL DATA:  Acute onset of cough and fever. Left lateral chest pain. EXAM: CHEST - 2 VIEW COMPARISON:  Chest radiograph performed 10/30/2015 FINDINGS: The lungs are well-aerated. Mild left basilar airspace opacity could reflect mild pneumonia. There is no evidence of pleural effusion or pneumothorax. The heart is normal in size; the mediastinal contour is within normal limits. No acute osseous abnormalities  are seen. IMPRESSION: Mild left basilar airspace opacity could reflect mild pneumonia. Electronically Signed   By: Garald Balding M.D.   On: 09/12/2017 02:10    Procedures Procedures (including critical care time)  Medications Ordered in ED Medications - No data to display   Initial Impression / Assessment and Plan / ED Course  I have reviewed the triage vital signs and the nursing notes.  Pertinent labs & imaging results that were available during my care of the patient were reviewed by me and considered in my medical decision making (see chart for details).    Patient with left-sided rib/flank pain that started this morning.  She has had a cough for the past 2 days.  She also is noted to have mildly elevated temperature of 100 degrees in triage.  I am concerned for pneumonia.  Will check chest x-ray.  If chest x-ray is negative, consider CT scan of abdomen.  Chest x-ray is consistent with opacity in the left lower lobe which could reflect mild pneumonia.  Will give loading dose of IV antibiotics in the ED and discharged with Levaquin.  Patient seen by discussed with Dr. Leonides Schanz, who agrees with plan.  Vitals:   09/12/17 0345 09/12/17 0351  BP: (!) 119/58 118/61  Pulse: 73 72  Resp: (!) 22 18  Temp:  98.8 F (37.1 C)  SpO2: 98% 98%     Final Clinical Impressions(s) / ED  Diagnoses   Final diagnoses:  Community acquired pneumonia of left lower lobe of lung [facility])    ED Discharge Orders    None       Montine Circle, PA-C 09/12/17 0501    Ward, Delice Bison, DO 09/12/17 0505

## 2017-09-13 ENCOUNTER — Encounter: Payer: Self-pay | Admitting: Family Medicine

## 2017-09-13 ENCOUNTER — Other Ambulatory Visit: Payer: Self-pay

## 2017-09-13 ENCOUNTER — Ambulatory Visit (INDEPENDENT_AMBULATORY_CARE_PROVIDER_SITE_OTHER): Payer: Medicare HMO | Admitting: Family Medicine

## 2017-09-13 VITALS — BP 122/68 | HR 78 | Temp 98.7°F | Resp 16 | Ht 69.5 in | Wt 173.0 lb

## 2017-09-13 DIAGNOSIS — R0781 Pleurodynia: Secondary | ICD-10-CM

## 2017-09-13 DIAGNOSIS — J181 Lobar pneumonia, unspecified organism: Secondary | ICD-10-CM | POA: Diagnosis not present

## 2017-09-13 DIAGNOSIS — J189 Pneumonia, unspecified organism: Secondary | ICD-10-CM

## 2017-09-13 MED ORDER — ALBUTEROL SULFATE HFA 108 (90 BASE) MCG/ACT IN AERS
2.0000 | INHALATION_SPRAY | Freq: Four times a day (QID) | RESPIRATORY_TRACT | 0 refills | Status: DC | PRN
Start: 2017-09-13 — End: 2017-11-05

## 2017-09-13 MED ORDER — TRAMADOL HCL 50 MG PO TABS: 50.0000 mg | ORAL_TABLET | Freq: Two times a day (BID) | ORAL | 0 refills | Status: DC | PRN

## 2017-09-13 MED ORDER — PREDNISONE 20 MG PO TABS: 20.0000 mg | ORAL_TABLET | Freq: Every day | ORAL | 0 refills | Status: DC

## 2017-09-13 MED ORDER — IPRATROPIUM-ALBUTEROL 0.5-2.5 (3) MG/3ML IN SOLN
3.0000 mL | Freq: Once | RESPIRATORY_TRACT | Status: AC
Start: 2017-09-13 — End: 2017-09-13
  Administered 2017-09-13: 3 mL via RESPIRATORY_TRACT

## 2017-09-13 MED ORDER — METHYLPREDNISOLONE ACETATE 40 MG/ML IJ SUSP
60.0000 mg | Freq: Once | INTRAMUSCULAR | Status: AC
  Administered 2017-09-13: 60 mg via INTRAMUSCULAR

## 2017-09-13 NOTE — Progress Notes (Signed)
Patient ID: Catherine Lowery, female    DOB: February 14, 1946, 72 y.o.   MRN: [id number]  PCP: Alycia Rossetti, MD  Chief Complaint  Patient presents with  . Rib Pain    was seen in ER and Dx PNA- given Levaquin- has pain in LLL     Subjective:   Catherine Lowery is a 72 y.o. female, presents to clinic with CC of left rib pain and CAP, dx in the ER last night.  She presents with severe left sided rib pain, 10/10, sharp, exacerbated with movement, palpation, deep breaths.  She is taking shallow breaths to help with the pain, no other alleviating or aggravating factors.  She was given levaquin in the ER yesterday and feels no better.  She denies SOB, cough, fatigue, fever, sweats.          Patient Active Problem List   Diagnosis Date Noted  . DNR (do not resuscitate) 05/22/2017  . Other fatigue 04/02/2017  . Elevated fasting glucose 05/16/2016  . Benign familial tremor 05/16/2016  . Vitamin D deficiency 03/01/2016  . Essential tremor 03/01/2016  . Primary localized osteoarthritis of left hip 12/29/2015  . Encounter for screening colonoscopy 02/18/2015  . 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 Hyperlipidemia 01/12/2015  . Benign essential HTN 01/12/2015  . Tobacco abuse 01/12/2015  . Atypical chest pain 01/11/2015     Prior to Admission medications   Medication Sig Start Date End Date Taking? Authorizing Provider  aspirin 325 MG tablet Take 325 mg by mouth daily. Stop date 01/30/16   Yes [provider]  Cholecalciferol (VITAMIN D3) 2000 units TABS Take 1 tablet by mouth daily. 05/24/17  Yes [place], Modena Nunnery, MD  hydrochlorothiazide (MICROZIDE) 12.5 MG capsule Take 1 capsule (12.5 mg total) by mouth every morning. 05/24/17  Yes [place], Modena Nunnery, MD  levofloxacin (LEVAQUIN) 750 MG tablet Take 1 tablet (750 mg total) by mouth daily. 09/12/17  Yes Montine Circle, PA-C  primidone (MYSOLINE) 50 MG tablet Take 2  tablets (100 mg total) by mouth every morning. 05/24/17  Yes [place], Modena Nunnery, MD  propranolol ER (INDERAL LA) 80 MG 24 hr capsule TAKE 1 CAPSULE BY MOUTH EVERY DAY 06/18/17  Yes Penumalli, Vikram R, MD  VOLTAREN 1 % GEL APPLY TOPICALLY 4 TIMES DAILY AS NEEDED 10/16/16  Yes [place], Modena Nunnery, MD  atorvastatin (LIPITOR) 40 MG tablet Take 1 tablet (40 mg total) by mouth daily. 05/24/17 08/22/17  Alycia Rossetti, MD     Allergies  Allergen Reactions  . Morphine And Related Itching  . Other Itching and Nausea And Vomiting    "PAIN MEDICINES"  . Percocet [Oxycodone-Acetaminophen] Nausea And Vomiting     Family History  Problem Relation Age of Onset  . Hypertension Mother   . Heart disease Mother   . Stroke Mother   . Cancer Father      Social History   Socioeconomic History  . Marital status: Married    Spouse name: Not on file  . Number of children: 1  . Years of education: 33  . Highest education level: Not on file  Occupational History    Comment: retired  Scientific laboratory technician  . Financial resource strain: Not on file  . Food insecurity:    Worry: Not on file    Inability: Not on file  . Transportation needs:    Medical: Not on file    Non-medical: Not on file  Tobacco Use  . Smoking status: Current Every Day Smoker    Packs/day: 0.50    Years: 20.00    Pack years: 10.00  . Smokeless tobacco: Never Used  . Tobacco comment: 06/06/16 Pt stated "I'm trying to quit"  Substance and Sexual Activity  . Alcohol use: No    Alcohol/week: 0.0 oz  . Drug use: No  . Sexual activity: Not Currently  Lifestyle  . Physical activity:    Days per week: Not on file    Minutes per session: Not on file  . Stress: Not on file  Relationships  . Social connections:    Talks on phone: Not on file    Gets together: Not on file    Attends religious service: Not on file    Active member of club or organization: Not on file    Attends meetings of clubs or organizations: Not on file     Relationship status: Not on file  . Intimate partner violence:    Fear of current or ex partner: Not on file    Emotionally abused: Not on file    Physically abused: Not on file    Forced sexual activity: Not on file  Other Topics Concern  . Not on file  Social History Narrative   Lives with handicapped son   Caffeine- coffee, 2 cups daily, 1-2 sodas daily     Review of Systems  All other systems reviewed and are negative. 10 Systems reviewed and are negative for acute change except as noted in the HPI.      Objective:    Vitals:   09/13/17 1103  BP: 122/68  Pulse: 78  Resp: 16  Temp: 98.7 F (37.1 C)  TempSrc: Oral  SpO2: 96%  Weight: 173 lb (78.5 kg)  Height: 5' 9.5" (1.765 m)      Physical Exam  Constitutional: She is oriented to person, place, and time. She appears well-developed and well-nourished.  Non-toxic appearance. No distress.  HENT:  Head: Normocephalic and atraumatic.  Right Ear: External ear normal.  Left Ear: External ear normal.  Nose: Nose normal.  Mouth/Throat: Uvula is midline, oropharynx is clear and moist and mucous membranes are normal.  Eyes: Pupils are equal, round, and reactive to light. Conjunctivae, EOM and lids are normal.  Neck: Normal range of motion and phonation normal. Neck supple. No tracheal deviation present.  Cardiovascular: Normal rate, regular rhythm, normal heart sounds and normal pulses. Exam reveals no gallop and no friction rub.  No murmur heard. Pulses:      Radial pulses are 2+ on the right side, and 2+ on the left side.       Posterior tibial pulses are 2+ on the right side, and 2+ on the left side.  Pulmonary/Chest: No stridor. No respiratory distress. She has no wheezes. She has no rhonchi. She has rales. She exhibits tenderness.  Splinted inspiratory effort, diminished BS b/l at the bases with rales to left lower lung field Left lower ribs ttp To distress, no retractions  Abdominal: Soft. Normal appearance and  bowel sounds are normal. She exhibits no distension. There is no tenderness. There is no rebound and no guarding.  Musculoskeletal: Normal range of motion. She exhibits no edema or deformity.  Lymphadenopathy:    She has no cervical adenopathy.  Neurological: She is alert and oriented to person, place, and time. She exhibits normal muscle tone. Gait normal.  Skin: Skin is warm, dry and intact. Capillary refill takes less than  2 seconds. No rash noted. She is not diaphoretic. No cyanosis. No pallor.  Psychiatric: She has a normal mood and affect. Her speech is normal and behavior is normal.  Nursing note and vitals reviewed.         Assessment & Plan:      ICD-10-CM   1. Pleuritic chest pain R07.81 ipratropium-albuterol (DUONEB) 0.5-2.5 (3) MG/3ML nebulizer solution 3 mL    methylPREDNISolone acetate (DEPO-MEDROL) injection 60 mg    traMADol (ULTRAM) 50 MG tablet    predniSONE (DELTASONE) 20 MG tablet  2. Community acquired pneumonia of left lower lobe of lung (HCC) J18.1 predniSONE (DELTASONE) 20 MG tablet    albuterol (PROVENTIL HFA;VENTOLIN HFA) 108 (90 Base) MCG/ACT inhaler    Patient is a 72 year old female was seen in the ER yesterday diagnosed with left lower lobe pneumonia and discharged with Levaquin.  She also received IV Rocephin and IV azithromycin while in the ER.  She denies any shortness of breath or cough her symptoms are inspiratory chest pain and rib pain on the left side.  Approximately 2 weeks ago she had a preceding URI and then pain followed.  She was given steroids and breathing treatment in clinic and her breath sounds improved significantly, she noted that her pain improved too, she was able to take a deeper breath improved aeration to the left lower lung fields.  Patient has incentive spirometer at home, she is encouraged to use every 1-2 hours, pain medicines to help limit pain and encourage deeper inspirations.  She will continue Levaquin, albuterol inhaler when  needed for shortness of breath or tightness.  Suspicious that she may have some pleurisy.  X-ray imaging from the ER and from a prior chest x-ray were reviewed, per my review:  X-ray from  09/11/17 has poor respiratory effort when compared to prior chest x-ray, costophrenic blunting, and left basilar opacity.  Radiology interpretation is significant for the left basilar opacity.    Patient is scheduled to follow-up with Dr. Buelah Manis in 1-2 weeks, this case was discussed with her.  Patient was advised to call if not improving, or having any worsening.     Delsa Grana, PA-C 09/13/17 11:20 AM

## 2017-09-14 ENCOUNTER — Encounter: Payer: Self-pay | Admitting: Family Medicine

## 2017-09-14 ENCOUNTER — Telehealth: Payer: Self-pay | Admitting: Family Medicine

## 2017-09-14 NOTE — Telephone Encounter (Signed)
Patient called in requesting a work note for work this weekend. Patient states that she works Friday, Saturday and Sunday night. May she have a work note. Please advise?

## 2017-09-14 NOTE — Telephone Encounter (Signed)
I'm find with that, can you help get her a note, thank you

## 2017-09-14 NOTE — Telephone Encounter (Signed)
Spoke with patient and informed her that we have a work note ready for her to pick up. Patient verbalized understanding.

## 2017-09-21 ENCOUNTER — Telehealth: Payer: Self-pay | Admitting: Family Medicine

## 2017-09-21 ENCOUNTER — Other Ambulatory Visit: Payer: Self-pay

## 2017-09-21 ENCOUNTER — Ambulatory Visit (INDEPENDENT_AMBULATORY_CARE_PROVIDER_SITE_OTHER): Payer: Medicare HMO | Admitting: Family Medicine

## 2017-09-21 VITALS — Temp 98.6°F | Resp 15 | Wt 171.2 lb

## 2017-09-21 DIAGNOSIS — R110 Nausea: Secondary | ICD-10-CM | POA: Diagnosis not present

## 2017-09-21 DIAGNOSIS — R0602 Shortness of breath: Secondary | ICD-10-CM | POA: Diagnosis not present

## 2017-09-21 DIAGNOSIS — R5383 Other fatigue: Secondary | ICD-10-CM | POA: Diagnosis not present

## 2017-09-21 LAB — URINALYSIS, ROUTINE W REFLEX MICROSCOPIC
Bilirubin Urine: NEGATIVE
Glucose, UA: NEGATIVE
Ketones, ur: NEGATIVE
NITRITE: NEGATIVE
Protein, ur: NEGATIVE
Specific Gravity, Urine: 1.015 (ref 1.001–1.03)
pH: 5.5 (ref 5.0–8.0)

## 2017-09-21 LAB — CBC WITH DIFFERENTIAL/PLATELET
BASOS PCT: 0.3 %
Basophils Absolute: 20 cells/uL (ref 0–200)
EOS ABS: 290 {cells}/uL (ref 15–500)
Eosinophils Relative: 4.4 %
HCT: 33.3 % — ABNORMAL LOW (ref 35.0–45.0)
HEMOGLOBIN: 10.9 g/dL — AB (ref 11.7–15.5)
Lymphs Abs: 2119 cells/uL (ref 850–3900)
MCH: 23 pg — AB (ref 27.0–33.0)
MCHC: 32.7 g/dL (ref 32.0–36.0)
MCV: 70.4 fL — ABNORMAL LOW (ref 80.0–100.0)
MPV: 9.7 fL (ref 7.5–12.5)
Monocytes Relative: 10.3 %
NEUTROS ABS: 3491 {cells}/uL (ref 1500–7800)
Neutrophils Relative %: 52.9 %
Platelets: 321 10*3/uL (ref 140–400)
RBC: 4.73 10*6/uL (ref 3.80–5.10)
RDW: 15.1 % — ABNORMAL HIGH (ref 11.0–15.0)
Total Lymphocyte: 32.1 %
WBC: 6.6 10*3/uL (ref 3.8–10.8)
WBCMIX: 680 {cells}/uL (ref 200–950)

## 2017-09-21 LAB — COMPLETE METABOLIC PANEL WITH GFR
AG Ratio: 1.2 (calc) (ref 1.0–2.5)
ALT: 36 U/L — AB (ref 6–29)
AST: 27 U/L (ref 10–35)
Albumin: 3.8 g/dL (ref 3.6–5.1)
Alkaline phosphatase (APISO): 86 U/L (ref 33–130)
BUN/Creatinine Ratio: 26 (calc) — ABNORMAL HIGH (ref 6–22)
BUN: 37 mg/dL — AB (ref 7–25)
CALCIUM: 9.4 mg/dL (ref 8.6–10.4)
CO2: 27 mmol/L (ref 20–32)
Chloride: 104 mmol/L (ref 98–110)
Creat: 1.4 mg/dL — ABNORMAL HIGH (ref 0.60–0.93)
GFR, EST AFRICAN AMERICAN: 43 mL/min/{1.73_m2} — AB (ref >=60)
GFR, EST NON AFRICAN AMERICAN: 37 mL/min/{1.73_m2} — AB (ref >=60)
GLUCOSE: 99 mg/dL (ref 65–99)
Globulin: 3.2 g/dL (calc) (ref 1.9–3.7)
Potassium: 4.5 mmol/L (ref 3.5–5.3)
Sodium: 138 mmol/L (ref 135–146)
TOTAL PROTEIN: 7 g/dL (ref 6.1–8.1)
Total Bilirubin: 0.3 mg/dL (ref 0.2–1.2)

## 2017-09-21 LAB — MICROSCOPIC MESSAGE

## 2017-09-21 LAB — BRAIN NATRIURETIC PEPTIDE: Brain Natriuretic Peptide: 21 pg/mL (ref <100)

## 2017-09-21 LAB — GLUCOSE 16585: GLUCOSE 2500: 109 mg/dL — AB (ref 65–99)

## 2017-09-21 MED ORDER — ONDANSETRON 4 MG PO TBDP: 4.0000 mg | ORAL_TABLET | Freq: Three times a day (TID) | ORAL | 0 refills | Status: DC | PRN

## 2017-09-21 MED ORDER — CEPHALEXIN 500 MG PO CAPS: 500.0000 mg | ORAL_CAPSULE | Freq: Three times a day (TID) | ORAL | 0 refills | Status: AC

## 2017-09-21 NOTE — Telephone Encounter (Signed)
Patient called and scheduled. Currently in office at this time

## 2017-09-21 NOTE — Progress Notes (Signed)
Patient in today with fatigue, with SOB on exertion, and lef pain. Onset 1 week ago. States also that she has had some nasal stuffiness,

## 2017-09-21 NOTE — Telephone Encounter (Signed)
Patient called in stating that she has finished prednisone that was prescribed to her on 09/13/17. She states that her cough has improved but she now has c/o fatigue, loss of appetite and sob on exertion. Please advise?

## 2017-09-21 NOTE — Telephone Encounter (Signed)
Pt needs to be worked in today or go to ER. Will likely need labs, EKG.  We will have to see if she is stable enough to get labs and do some meds over the weekend, or if she will need to go to the ER.

## 2017-09-21 NOTE — Progress Notes (Signed)
Patient ID: Catherine Lowery, female    DOB: [birth year], 72 y.o.   MRN: [id number]  PCP: Alycia Rossetti, MD  Chief Complaint  Patient presents with  . Shortness of Breath  . Fatigue    Subjective:   Catherine Lowery is a 72 y.o. female, presents to clinic with CC of feeling extremely fatigued, with exertional dyspnea, also has his appetite and nausea.  Symptoms have been gradually worsening over the past week.  She was seen last week for ER follow-up after being noticed with pneumonia.  At that time patient states she had no cough or shortness of breath, she only had rib pain and had not been taking deep breaths because of this.  He was treated with steroids, breathing treatment and Levaquin.  Her expiratory chest pain has improved and she denies any cough, chest pain, palpitations, lower extremity edema, orthopnea, PND.  She states she is just so exhausted that if she gets up to walk across her house or if she standing in the kitchen to cook that she has to sit down and rest.  She only feels short of breath with his work around her house.  But she states that she is also still doing third shift working all night, and taking care of her young grandchildren and she is managing to do that without any shortness of breath, fatigue, near syncope, chest pain.  Her stomach feels squeamish, she has decreased eating and drinking.  She denies any vomiting, diarrhea, melena, hematochezia.  Any back pain, OR urinary symptoms.  She has lost few pounds in the past week.    Patient Active Problem List   Diagnosis Date Noted  . DNR (do not resuscitate) 05/22/2017  . Other fatigue 04/02/2017  . Elevated fasting glucose 05/16/2016  . Benign familial tremor 05/16/2016  . Vitamin D deficiency 03/01/2016  . Essential tremor 03/01/2016  . Primary localized osteoarthritis of left hip 12/29/2015  . Encounter for screening colonoscopy 02/18/2015  . 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 Hyperlipidemia 01/12/2015  . Benign essential HTN 01/12/2015  . Tobacco abuse 01/12/2015  . Atypical chest pain 01/11/2015     Prior to Admission medications   Medication Sig Start Date End Date Taking? Authorizing Provider  albuterol (PROVENTIL HFA;VENTOLIN HFA) 108 (90 Base) MCG/ACT inhaler Inhale 2 puffs into the lungs every 6 (six) hours as needed for wheezing or shortness of breath. 09/13/17  Yes Delsa Grana, PA-C  aspirin 325 MG tablet Take 325 mg by mouth daily. Stop date 01/30/16   Yes [provider]  Cholecalciferol (VITAMIN D3) 2000 units TABS Take 1 tablet by mouth daily. 05/24/17  Yes Starr, Modena Nunnery, MD  hydrochlorothiazide (MICROZIDE) 12.5 MG capsule Take 1 capsule (12.5 mg total) by mouth every morning. 05/24/17  Yes [place], Modena Nunnery, MD  primidone (MYSOLINE) 50 MG tablet Take 2 tablets (100 mg total) by mouth every morning. 05/24/17  Yes Villanueva, Modena Nunnery, MD  propranolol ER (INDERAL LA) 80 MG 24 hr capsule TAKE 1 CAPSULE BY MOUTH EVERY DAY 06/18/17  Yes Penumalli, Earlean Polka, MD  traMADol (ULTRAM) 50 MG tablet Take 1 tablet (50 mg total) by mouth every 12 (twelve) hours as needed for severe pain. 09/13/17  Yes Delsa Grana, PA-C  atorvastatin (LIPITOR) 40 MG tablet Take 1 tablet (40 mg total) by mouth daily. 05/24/17 08/22/17  Alycia Rossetti, MD  VOLTAREN 1 % GEL APPLY TOPICALLY 4 TIMES DAILY  AS NEEDED Patient not taking: Reported on 09/21/2017 10/16/16   Alycia Rossetti, MD     Allergies  Allergen Reactions  . Morphine And Related Itching  . Other Itching and Nausea And Vomiting    "PAIN MEDICINES"  . Percocet [Oxycodone-Acetaminophen] Nausea And Vomiting     Family History  Problem Relation Age of Onset  . Hypertension Mother   . Heart disease Mother   . Stroke Mother   . Cancer Father      Social History   Socioeconomic History  . Marital status: Married    Spouse name: Not on file  . Number of children:  1  . Years of education: 34  . Highest education level: Not on file  Occupational History    Comment: retired  Scientific laboratory technician  . Financial resource strain: Not on file  . Food insecurity:    Worry: Not on file    Inability: Not on file  . Transportation needs:    Medical: Not on file    Non-medical: Not on file  Tobacco Use  . Smoking status: Current Every Day Smoker    Packs/day: 0.50    Years: 20.00    Pack years: 10.00  . Smokeless tobacco: Never Used  . Tobacco comment: 06/06/16 Pt stated "I'm trying to quit"  Substance and Sexual Activity  . Alcohol use: No    Alcohol/week: 0.0 oz  . Drug use: No  . Sexual activity: Not Currently  Lifestyle  . Physical activity:    Days per week: Not on file    Minutes per session: Not on file  . Stress: Not on file  Relationships  . Social connections:    Talks on phone: Not on file    Gets together: Not on file    Attends religious service: Not on file    Active member of club or organization: Not on file    Attends meetings of clubs or organizations: Not on file    Relationship status: Not on file  . Intimate partner violence:    Fear of current or ex partner: Not on file    Emotionally abused: Not on file    Physically abused: Not on file    Forced sexual activity: Not on file  Other Topics Concern  . Not on file  Social History Narrative   Lives with handicapped son   Caffeine- coffee, 2 cups daily, 1-2 sodas daily     Review of Systems  Constitutional: Positive for appetite change, fatigue and unexpected weight change. Negative for activity change, chills, diaphoresis and fever.  HENT: Negative.   Eyes: Negative.   Respiratory: Positive for shortness of breath. Negative for apnea, cough, choking, chest tightness, wheezing and stridor.   Cardiovascular: Negative.  Negative for chest pain, palpitations and leg swelling.  Gastrointestinal: Positive for nausea. Negative for abdominal distention, abdominal pain, anal  bleeding, constipation, diarrhea, rectal pain and vomiting.  Endocrine: Negative.   Genitourinary: Negative.   Musculoskeletal: Negative.   Skin: Negative.   Allergic/Immunologic: Negative.   Neurological: Negative.  Negative for dizziness, tremors, syncope, light-headedness, numbness and headaches.  Hematological: Negative.   Psychiatric/Behavioral: Negative.   All other systems reviewed and are negative.      Objective:    Vitals:   09/21/17 1415  BP: 120/64  Pulse: 63  Resp: 15  Temp: 98.6 F (37 C)  TempSrc: Oral  SpO2: 97%  Weight: 171 lb 4 oz (77.7 kg)      Physical Exam  Constitutional: She is oriented to person, place, and time. She appears well-developed and well-nourished.  Non-toxic appearance. She does not appear ill. No distress.  Alert elderly female, nontoxic appearing, does appear thinner than she did last week  HENT:  Head: Normocephalic and atraumatic.  Right Ear: External ear normal.  Left Ear: External ear normal.  Nose: Nose normal.  Mouth/Throat: Uvula is midline, oropharynx is clear and moist and mucous membranes are normal.  MM dry, tongue matted  Eyes: Pupils are equal, round, and reactive to light. Conjunctivae, EOM and lids are normal. Right eye exhibits no discharge. Left eye exhibits no discharge. No scleral icterus.  Neck: Normal range of motion and phonation normal. Neck supple. No tracheal deviation present.  Cardiovascular: Normal rate, regular rhythm, normal heart sounds, intact distal pulses and normal pulses. Exam reveals no gallop and no friction rub.  No murmur heard. Pulses:      Radial pulses are 2+ on the right side, and 2+ on the left side.       Posterior tibial pulses are 2+ on the right side, and 2+ on the left side.  Pulmonary/Chest: Effort normal. No stridor. No respiratory distress. She has no decreased breath sounds. She has no wheezes. She has no rhonchi. She has rales (fine crackels with deep inspiration) in the right  lower field and the left lower field. She exhibits no tenderness, no crepitus, no edema and no swelling.  Abdominal: Soft. Normal appearance and bowel sounds are normal. She exhibits no distension, no ascites and no mass. There is no tenderness. There is no rebound and no guarding.  No CVA tenderness  Musculoskeletal: Normal range of motion. She exhibits no edema or deformity.       Right lower leg: Normal. She exhibits no tenderness and no edema.       Left lower leg: Normal. She exhibits no tenderness and no edema.  Lymphadenopathy:    She has no cervical adenopathy.  Neurological: She is alert and oriented to person, place, and time. She exhibits normal muscle tone. Coordination and gait normal.  Skin: Skin is warm, dry and intact. Capillary refill takes less than 2 seconds. No rash noted. She is not diaphoretic. No cyanosis. No pallor. Nails show no clubbing.  Psychiatric: She has a normal mood and affect. Her speech is normal and behavior is normal. Her mood appears not anxious.  Nursing note and vitals reviewed.     EKG:   NSR, rate 64, normal axis, no ST elevation or depression, good R wave progression.      Assessment & Plan:      ICD-10-CM   1. SOB (shortness of breath) on exertion R06.02 EKG 12-Lead    COMPLETE METABOLIC PANEL WITH GFR    Brain natriuretic peptide    CBC with Differential  2. Other fatigue R53.83 EKG 12-Lead    Glucose, fingerstick (stat)    Urinalysis, Routine w reflex microscopic    COMPLETE METABOLIC PANEL WITH GFR    Brain natriuretic peptide    CBC with Differential    Urine Culture    CANCELED: COMPLETE METABOLIC PANEL WITH GFR    CANCELED: TSH    Pt appears mildly dehydrated / hypovolemic, 8 pound weight loss last week,  Patient vital signs are reassuring otherwise, no desaturation or increased work of breathing with ambulate around clinic multiple times.  EKG normal.  Orthostatics negative.   On exam she has decreased skin turgor and mildly dry  oral mucosa, some continued  bibasilar crackles, no abdominal tenderness on exam.  Everything is very reassuring that her fatigue is likely not respiratory or cardiac in nature.  Is been reassuring that she is been able to continue to go to work and she is also taking care of her young grandchildren throughout the day.  Seems that her exertional dyspnea is very occasional and perhaps is just some fatigue in between everything else that she is doing and likely secondary to some decreased appetite and secondary to some dehydration.    Etiology of her change in appetite is not clear.  I have encouraged her to push fluids.  We will do some basic lab work today kidney function, electrolytes, rule out anemia.  Recently had her TSH done with and it was within normal limits so will not repeat that today.  Get BNP, culture urine.  Urine dip had few bacteria, trace leukocytes and trace blood so will cover for possible UTI, and wait for culture.  Patient may need further evaluation and testing and is encouraged to follow-up with [place] if not improved in the next 1 to 2 weeks.  Delsa Grana, PA-C 09/21/17 2:54 PM

## 2017-09-23 LAB — URINE CULTURE
MICRO NUMBER:: 90633478
SPECIMEN QUALITY: ADEQUATE

## 2017-09-24 NOTE — Progress Notes (Signed)
Negative urine culture.  No UTI, can stop medicines.   The lab for heart failure was negative Your metabolic panel was concerning for some dehydration, and slightly worse kidney function than most recent labs.  Your sodium and potassium were normal. Continue to push your hydration with clear fluids but are not high in sugar.  Return to clinic for reevaluation she continued to have any decreased energy decreased appetite, weight loss or any vague abdominal complaints this will need to be rechecked and worked up more

## 2017-09-28 ENCOUNTER — Telehealth: Payer: Self-pay | Admitting: Family Medicine

## 2017-09-28 MED ORDER — FLUCONAZOLE 150 MG PO TABS: 150.0000 mg | ORAL_TABLET | Freq: Once | ORAL | 1 refills | Status: AC

## 2017-09-28 NOTE — Telephone Encounter (Signed)
Yes - sent to pharmacy on file

## 2017-09-28 NOTE — Telephone Encounter (Signed)
Spoke with patient and informed her per Hinda Glatter- we sent in some medications for her yeast infection. Patient verbalized understanding.

## 2017-09-28 NOTE — Telephone Encounter (Signed)
Patient has been taking antibiotics, now is having a yeast infection  Would like to know if she can get something called in for this  Patient saw leisa tapia [phone]

## 2017-10-03 ENCOUNTER — Ambulatory Visit (INDEPENDENT_AMBULATORY_CARE_PROVIDER_SITE_OTHER): Payer: Medicare HMO | Admitting: Family Medicine

## 2017-10-03 ENCOUNTER — Encounter: Payer: Self-pay | Admitting: Family Medicine

## 2017-10-03 ENCOUNTER — Other Ambulatory Visit: Payer: Self-pay

## 2017-10-03 VITALS — BP 128/64 | HR 64 | Temp 97.6°F | Resp 14 | Ht 69.5 in | Wt 170.0 lb

## 2017-10-03 DIAGNOSIS — R634 Abnormal weight loss: Secondary | ICD-10-CM | POA: Diagnosis not present

## 2017-10-03 DIAGNOSIS — J181 Lobar pneumonia, unspecified organism: Secondary | ICD-10-CM

## 2017-10-03 DIAGNOSIS — N289 Disorder of kidney and ureter, unspecified: Secondary | ICD-10-CM | POA: Diagnosis not present

## 2017-10-03 DIAGNOSIS — J189 Pneumonia, unspecified organism: Secondary | ICD-10-CM

## 2017-10-03 LAB — COMPREHENSIVE METABOLIC PANEL
AG RATIO: 1.4 (calc) (ref 1.0–2.5)
ALKALINE PHOSPHATASE (APISO): 86 U/L (ref 33–130)
ALT: 22 U/L (ref 6–29)
AST: 20 U/L (ref 10–35)
Albumin: 4.1 g/dL (ref 3.6–5.1)
BILIRUBIN TOTAL: 0.5 mg/dL (ref 0.2–1.2)
BUN/Creatinine Ratio: 27 (calc) — ABNORMAL HIGH (ref 6–22)
BUN: 31 mg/dL — ABNORMAL HIGH (ref 7–25)
CALCIUM: 9.3 mg/dL (ref 8.6–10.4)
CHLORIDE: 106 mmol/L (ref 98–110)
CO2: 25 mmol/L (ref 20–32)
Creat: 1.13 mg/dL — ABNORMAL HIGH (ref 0.60–0.93)
GLOBULIN: 3 g/dL (ref 1.9–3.7)
Glucose, Bld: 95 mg/dL (ref 65–99)
Potassium: 4.3 mmol/L (ref 3.5–5.3)
Sodium: 140 mmol/L (ref 135–146)
Total Protein: 7.1 g/dL (ref 6.1–8.1)

## 2017-10-03 LAB — EXTRA LAV TOP TUBE

## 2017-10-03 NOTE — Progress Notes (Signed)
   Subjective:    Patient ID: Catherine Lowery, female    DOB: June 11, 1945, 72 y.o.   MRN: [id number]  Patient presents for Follow-up (PNA)  Was seen in ER 5/15, was having pluertic chest pain with SOB. Xray was done showed infiltrate on left basilar region, which is where she had pain. Given Levaquin  She then came in the next day 5/16 to office given neb treatment as well prednisone, ultram for rib/pleurtic chest pain and and albuterol inhaler.  Returned on 5/24 still had SOB, EKG was done. Labs from that visit were fairly unremarkable besides mild renal insufficiency  Weight down 10lbs since beginning of May States Appetite has improved, yesterday had egg sandwhich and 2 cups for breakfast, around 7pm had 1 slice of pizza   Denies any current SOB, no chest pain. No change in bowels, no pain  States that she feels fine now.   Review Of Systems:  GEN- denies fatigue, fever, +weight loss,weakness, recent illness HEENT- denies eye drainage, change in vision, nasal discharge, CVS- denies chest pain, palpitations RESP- denies SOB, cough, wheeze ABD- denies N/V, change in stools, abd pain GU- denies dysuria, hematuria, dribbling, incontinence MSK- denies joint pain, muscle aches, injury Neuro- denies headache, dizziness, syncope, seizure activity       Objective:    BP 128/64   Pulse 64   Temp 97.6 F (36.4 C) (Oral)   Resp 14   Ht 5' 9.5" (1.765 m)   Wt 170 lb (77.1 kg)   LMP 01/24/2000   SpO2 96%   BMI 24.74 kg/m  GEN- NAD, alert and oriented x3 HEENT- PERRL, EOMI, non injected sclera, pink conjunctiva, MMM, oropharynx clear Neck- Supple, no thyromegaly, no LAD CVS- RRR, no murmur RESP-CTAB ABD-NABS,soft,NT,ND EXT- trace ankle edema bilat  Pulses- Radial 2+        Assessment & Plan:      Problem List Items Addressed This Visit    None    Visit Diagnoses    Community acquired pneumonia of left lower lobe of lung ([place])    -  Primary   Recheck CXR next week,  needs to ensure clearance and no other underlying pathology especially with teh weight loss. She did have thyroid checked recently by endocrine so weight loss not due to that   Add ensure and boost, discussed she is not eating enough calories Note she is happy with weight states she has brought new clothes Would beneift from at least 5lb weight gain Recheck weight in 4 weeks  She is also rescheduling her mammogram    Acute renal insufficiency       Relevant Orders   Comprehensive metabolic panel   Weight loss          Note: This dictation was prepared with Dragon dictation along with smaller phrase technology. Any transcriptional errors that result from this process are unintentional.

## 2017-10-03 NOTE — Patient Instructions (Signed)
Drink Boost or Ensure 1 a day  Increase your fluids Get the Chest xray - Get in next Monday or Tuesday next week -  [facility]  - [place] [facility] F/U 4 weeks

## 2017-10-09 ENCOUNTER — Ambulatory Visit
Admission: RE | Admit: 2017-10-09 | Discharge: 2017-10-09 | Disposition: A | Discharge status: Home / Self Care | Payer: Medicare HMO | Source: Ambulatory Visit | Attending: Family Medicine | Admitting: Family Medicine

## 2017-10-09 DIAGNOSIS — J189 Pneumonia, unspecified organism: Secondary | ICD-10-CM | POA: Diagnosis not present

## 2017-10-09 DIAGNOSIS — J181 Lobar pneumonia, unspecified organism: Principal | ICD-10-CM

## 2017-10-10 ENCOUNTER — Other Ambulatory Visit: Payer: Self-pay | Admitting: Diagnostic Neuroimaging

## 2017-10-30 ENCOUNTER — Other Ambulatory Visit: Payer: Self-pay | Admitting: Diagnostic Neuroimaging

## 2017-10-31 ENCOUNTER — Encounter: Payer: Self-pay | Admitting: Family Medicine

## 2017-10-31 ENCOUNTER — Other Ambulatory Visit: Payer: Self-pay

## 2017-10-31 ENCOUNTER — Ambulatory Visit (INDEPENDENT_AMBULATORY_CARE_PROVIDER_SITE_OTHER): Payer: Medicare HMO | Admitting: Family Medicine

## 2017-10-31 VITALS — BP 130/68 | HR 63 | Temp 98.4°F | Resp 12 | Ht 69.5 in | Wt 171.0 lb

## 2017-10-31 DIAGNOSIS — G250 Essential tremor: Secondary | ICD-10-CM | POA: Diagnosis not present

## 2017-10-31 DIAGNOSIS — I10 Essential (primary) hypertension: Secondary | ICD-10-CM | POA: Diagnosis not present

## 2017-10-31 DIAGNOSIS — E441 Mild protein-calorie malnutrition: Secondary | ICD-10-CM | POA: Diagnosis not present

## 2017-10-31 MED ORDER — HYDROCHLOROTHIAZIDE 12.5 MG PO CAPS: 12.5000 mg | ORAL_CAPSULE | Freq: Every morning | ORAL | 2 refills | Status: DC

## 2017-10-31 MED ORDER — MIRTAZAPINE 15 MG PO TABS: 7.5000 mg | ORAL_TABLET | Freq: Every day | ORAL | 2 refills | Status: DC

## 2017-10-31 MED ORDER — PRIMIDONE 50 MG PO TABS: 100.0000 mg | ORAL_TABLET | Freq: Every morning | ORAL | 4 refills | Status: DC

## 2017-10-31 NOTE — Patient Instructions (Addendum)
Cancel appt for 7/23 Take 1/2 tablet remeron for weight  Drink boost  F/U 2 months  [phone] Dr. Ardis Hughs Gastroenterologist

## 2017-10-31 NOTE — Assessment & Plan Note (Signed)
Refilled primadone

## 2017-10-31 NOTE — Progress Notes (Signed)
   Subjective:    Patient ID: Catherine Lowery, female    DOB: 19-Jun-1945, 72 y.o.   MRN: [id number]  Patient presents for Follow-up (is fasting)  Pt here for intermin f/u. Last visit had f/u for her PNA. Her weight was also down 10lbs since May. She had had decreased appetite during that time.Her repeat CXR was normal Advised Boost/Ensure for weight and appetite. Today, weight is up 1lb in 1 month.  States she feels good  24 hour recall - Dinner - meatball sandwhich and cherries , no lunch, no breakfast- 2 cups coffee She has not drinking boost ensure   Meds reviewed , Taking the all, needs refills   Had recent CMET, CBC, Thyroid function studies which were at baseline    Review Of Systems:  GEN- denies fatigue, fever, weight loss,weakness, recent illness HEENT- denies eye drainage, change in vision, nasal discharge, CVS- denies chest pain, palpitations RESP- denies SOB, cough, wheeze ABD- denies N/V, change in stools, abd pain GU- denies dysuria, hematuria, dribbling, incontinence MSK- denies joint pain, muscle aches, injury Neuro- denies headache, dizziness, syncope, seizure activity       Objective:    BP 130/68   Pulse 63   Temp 98.4 F (36.9 C) (Oral)   Resp 12   Ht 5' 9.5" (1.765 m)   Wt 171 lb (77.6 kg)   LMP 01/24/2000   SpO2 97%   BMI 24.89 kg/m  GEN- NAD, alert and oriented x3,well appearing  HEENT- PERRL, EOMI, non injected sclera, pink conjunctiva, MMM, oropharynx clear Neck- Supple, no thyromegaly CVS- RRR, no murmur RESP-CTAB ABD-NABS,soft,NT,ND EXT- No edema Pulses- Radial 2+        Assessment & Plan:      Problem List Items Addressed This Visit      Unprioritized   Benign essential HTN - Primary    Well controlled no changes       Relevant Medications   hydrochlorothiazide (MICROZIDE) 12.5 MG capsule   Benign familial tremor    Refilled primadone       Other Visit Diagnoses    Mild protein-calorie malnutrition ([place])       She  is not eating regulary, states doesnt feel like it, concerned with her age,not eating regulary , protein malnutrition, will add remeron 7.5mg  and see if this is her appetite.  We will have her follow-up in 2 months for recheck.  Also discussed with her trying the boost or Ensure especially in the mornings instead of just drinking coffee       Note: This dictation was prepared with Dragon dictation along with smaller phrase technology. Any transcriptional errors that result from this process are unintentional.

## 2017-10-31 NOTE — Assessment & Plan Note (Signed)
Well controlled no changes 

## 2017-11-05 ENCOUNTER — Other Ambulatory Visit: Payer: Self-pay

## 2017-11-05 DIAGNOSIS — J181 Lobar pneumonia, unspecified organism: Principal | ICD-10-CM

## 2017-11-05 DIAGNOSIS — N183 Chronic kidney disease, stage 3 (moderate): Secondary | ICD-10-CM | POA: Diagnosis not present

## 2017-11-05 DIAGNOSIS — I10 Essential (primary) hypertension: Secondary | ICD-10-CM | POA: Diagnosis not present

## 2017-11-05 DIAGNOSIS — Z720 Tobacco use: Secondary | ICD-10-CM | POA: Diagnosis not present

## 2017-11-05 DIAGNOSIS — J189 Pneumonia, unspecified organism: Secondary | ICD-10-CM

## 2017-11-05 MED ORDER — ALBUTEROL SULFATE HFA 108 (90 BASE) MCG/ACT IN AERS
2.0000 | INHALATION_SPRAY | Freq: Four times a day (QID) | RESPIRATORY_TRACT | 0 refills | Status: DC | PRN
Start: 2017-11-05 — End: 2018-05-23

## 2017-11-20 ENCOUNTER — Ambulatory Visit: Payer: Medicare HMO | Admitting: Family Medicine

## 2018-01-01 ENCOUNTER — Ambulatory Visit: Payer: Medicare HMO | Admitting: Family Medicine

## 2018-01-22 ENCOUNTER — Ambulatory Visit (INDEPENDENT_AMBULATORY_CARE_PROVIDER_SITE_OTHER): Payer: Medicare HMO | Admitting: Family Medicine

## 2018-01-22 ENCOUNTER — Encounter: Payer: Self-pay | Admitting: Family Medicine

## 2018-01-22 ENCOUNTER — Other Ambulatory Visit: Payer: Self-pay

## 2018-01-22 VITALS — BP 124/72 | HR 68 | Temp 98.1°F | Resp 14 | Ht 69.5 in | Wt 174.0 lb

## 2018-01-22 DIAGNOSIS — Z780 Asymptomatic menopausal state: Secondary | ICD-10-CM

## 2018-01-22 DIAGNOSIS — E782 Mixed hyperlipidemia: Secondary | ICD-10-CM | POA: Diagnosis not present

## 2018-01-22 DIAGNOSIS — M25551 Pain in right hip: Secondary | ICD-10-CM

## 2018-01-22 DIAGNOSIS — I10 Essential (primary) hypertension: Secondary | ICD-10-CM | POA: Diagnosis not present

## 2018-01-22 DIAGNOSIS — E441 Mild protein-calorie malnutrition: Secondary | ICD-10-CM

## 2018-01-22 DIAGNOSIS — M1611 Unilateral primary osteoarthritis, right hip: Secondary | ICD-10-CM

## 2018-01-22 DIAGNOSIS — Z9189 Other specified personal risk factors, not elsewhere classified: Secondary | ICD-10-CM | POA: Diagnosis not present

## 2018-01-22 DIAGNOSIS — Z23 Encounter for immunization: Secondary | ICD-10-CM

## 2018-01-22 DIAGNOSIS — R634 Abnormal weight loss: Secondary | ICD-10-CM

## 2018-01-22 MED ORDER — ATORVASTATIN CALCIUM 40 MG PO TABS: 40.0000 mg | ORAL_TABLET | Freq: Every day | ORAL | 3 refills | Status: DC

## 2018-01-22 MED ORDER — PROPRANOLOL HCL 40 MG PO TABS: 40.0000 mg | ORAL_TABLET | Freq: Two times a day (BID) | ORAL | 1 refills | Status: DC

## 2018-01-22 MED ORDER — HYDROCHLOROTHIAZIDE 12.5 MG PO CAPS: 12.5000 mg | ORAL_CAPSULE | Freq: Every morning | ORAL | 2 refills | Status: DC

## 2018-01-22 NOTE — Progress Notes (Signed)
Patient ID: Catherine Lowery, female    DOB: 1946-02-07, 72 y.o.   MRN: [id number]  PCP: Alycia Rossetti, MD  Chief Complaint  Patient presents with  . Follow-up    is fasting  . Referral    [facility]- R hip pain  . Medication Management    needs to change propanolol from capsule to tablet    Subjective:   Catherine Lowery is a 72 y.o. female, presents to clinic with CC of worsening right hip pain, HTN and routine labs and needs adjustment on meds due to high cost of propanolol capsule.  Last visit she was unintentionally loosing weight.  Her PCP advised boost and gave med to help increase appetite.  Pt states she is drinking boost and eating just fine, cannot take the appetite pill, did not like it.    Wt Readings from Last 5 Encounters:  01/22/18 174 lb (78.9 kg)  10/31/17 171 lb (77.6 kg)  10/03/17 170 lb (77.1 kg)  09/21/17 171 lb 4 oz (77.7 kg)  09/13/17 173 lb (78.5 kg)     Taking all meds except appetite pill, and no thyroid meds, managed with endocrine Eating well, baked chicken salads, and also doing boost Believes shes maintaining weight Lungs good, no SOB, feel completely improved since CAP 3 months ago, no wheeze, coughing, CP Out of propranolol - cost too high, wants BID tablets.  She will have mild palpitations w/o propanolol. For HTN she is also taking HCTZ 12.5 mg.  She denies any low or high BP episodes or sx that she knows of.     She would like to get flu shot done today and believes she has not had a bone scan in a long long time. She wants bone scan before she replacs all joints.   She also request referral to ortho, had previously seen Raliegh Ip for left hip arthritis and subsequent joint replacement and was told she would eventually need the right hip done.   It is gradually worsening with joint pain, constant, worse with movement and weightbearing and she would like to go back to eval/treatment.     Patient Active Problem List   Diagnosis Date Noted  . DNR (do not resuscitate) 05/22/2017  . Elevated fasting glucose 05/16/2016  . Benign familial tremor 05/16/2016  . Vitamin D deficiency 03/01/2016  . Primary localized osteoarthritis of left hip 12/29/2015  . Encounter for screening colonoscopy 02/18/2015  . 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 Hyperlipidemia 01/12/2015  . Benign essential HTN 01/12/2015  . Tobacco abuse 01/12/2015     Prior to Admission medications   Medication Sig Start Date End Date Taking? Authorizing Provider  albuterol (PROVENTIL HFA;VENTOLIN HFA) 108 (90 Base) MCG/ACT inhaler Inhale 2 puffs into the lungs every 6 (six) hours as needed for wheezing or shortness of breath. 11/05/17  Yes Delsa Grana, PA-C  aspirin 325 MG tablet Take 325 mg by mouth daily. Stop date 01/30/16   Yes [provider]  atorvastatin (LIPITOR) 40 MG tablet Take 1 tablet (40 mg total) by mouth daily. 05/24/17  Yes [place], Modena Nunnery, MD  Cholecalciferol (VITAMIN D3) 2000 units TABS Take 1 tablet by mouth daily. 05/24/17  Yes [place], Modena Nunnery, MD  hydrochlorothiazide (MICROZIDE) 12.5 MG capsule Take 1 capsule (12.5 mg total) by mouth every morning. 10/31/17  Yes [place], Modena Nunnery, MD  primidone (MYSOLINE) 50 MG tablet Take 2 tablets (100 mg total) by  mouth every morning. 10/31/17  Yes [place], Modena Nunnery, MD  propranolol ER (INDERAL LA) 80 MG 24 hr capsule TAKE 1 CAPSULE BY MOUTH EVERY DAY 06/18/17  Yes Penumalli, Vikram R, MD  VOLTAREN 1 % GEL APPLY TOPICALLY 4 TIMES DAILY AS NEEDED 10/16/16  Yes [place], Modena Nunnery, MD  mirtazapine (REMERON) 15 MG tablet Take 0.5 tablets (7.5 mg total) by mouth at bedtime. Patient not taking: Reported on 01/22/2018 10/31/17   Alycia Rossetti, MD     Allergies  Allergen Reactions  . Morphine And Related Itching  . Other Itching and Nausea And Vomiting    "PAIN MEDICINES"  . Percocet [Oxycodone-Acetaminophen] Nausea And Vomiting      Family History  Problem Relation Age of Onset  . Hypertension Mother   . Heart disease Mother   . Stroke Mother   . Cancer Father      Social History   Socioeconomic History  . Marital status: Married    Spouse name: Not on file  . Number of children: 1  . Years of education: 64  . Highest education level: Not on file  Occupational History    Comment: retired  Scientific laboratory technician  . Financial resource strain: Not on file  . Food insecurity:    Worry: Not on file    Inability: Not on file  . Transportation needs:    Medical: Not on file    Non-medical: Not on file  Tobacco Use  . Smoking status: Current Every Day Smoker    Packs/day: 0.50    Years: 20.00    Pack years: 10.00  . Smokeless tobacco: Never Used  . Tobacco comment: 06/06/16 Pt stated "I'm trying to quit"  Substance and Sexual Activity  . Alcohol use: No    Alcohol/week: 0.0 standard drinks  . Drug use: No  . Sexual activity: Not Currently  Lifestyle  . Physical activity:    Days per week: Not on file    Minutes per session: Not on file  . Stress: Not on file  Relationships  . Social connections:    Talks on phone: Not on file    Gets together: Not on file    Attends religious service: Not on file    Active member of club or organization: Not on file    Attends meetings of clubs or organizations: Not on file    Relationship status: Not on file  . Intimate partner violence:    Fear of current or ex partner: Not on file    Emotionally abused: Not on file    Physically abused: Not on file    Forced sexual activity: Not on file  Other Topics Concern  . Not on file  Social History Narrative   Lives with handicapped son   Caffeine- coffee, 2 cups daily, 1-2 sodas daily     Review of Systems  Constitutional: Negative.   HENT: Negative.   Eyes: Negative.   Respiratory: Negative.   Cardiovascular: Negative.   Gastrointestinal: Negative.   Endocrine: Negative.   Genitourinary: Negative.    Musculoskeletal: Negative.   Skin: Negative.   Allergic/Immunologic: Negative.   Neurological: Negative.   Hematological: Negative.   Psychiatric/Behavioral: Negative.   All other systems reviewed and are negative.      Objective:    Vitals:   01/22/18 0929  BP: 124/72  Pulse: 68  Resp: 14  Temp: 98.1 F (36.7 C)  TempSrc: Oral  SpO2: 98%  Weight: 174 lb (78.9 kg)  Height:  5' 9.5" (1.765 m)      Physical Exam  Constitutional: She is oriented to person, place, and time. She appears well-developed and well-nourished.  Non-toxic appearance. No distress.  HENT:  Head: Normocephalic and atraumatic.  Right Ear: External ear normal.  Left Ear: External ear normal.  Nose: Nose normal.  Mouth/Throat: Uvula is midline, oropharynx is clear and moist and mucous membranes are normal.  Eyes: Pupils are equal, round, and reactive to light. Conjunctivae, EOM and lids are normal.  Neck: Normal range of motion and phonation normal. Neck supple. No tracheal deviation present.  Cardiovascular: Normal rate, regular rhythm, normal heart sounds, intact distal pulses and normal pulses. Exam reveals no gallop and no friction rub.  No murmur heard. Pulses:      Radial pulses are 2+ on the right side, and 2+ on the left side.       Posterior tibial pulses are 2+ on the right side, and 2+ on the left side.  Pulmonary/Chest: Effort normal and breath sounds normal. No stridor. No respiratory distress. She has no wheezes. She has no rhonchi. She has no rales. She exhibits no tenderness.  Abdominal: Soft. Normal appearance and bowel sounds are normal. She exhibits no distension. There is no tenderness. There is no rebound and no guarding.  Musculoskeletal: Normal range of motion. She exhibits no edema or deformity.  Lymphadenopathy:    She has no cervical adenopathy.  Neurological: She is alert and oriented to person, place, and time. She exhibits normal muscle tone. Gait normal.  Mildly antalgic  gait  Skin: Skin is warm, dry and intact. Capillary refill takes less than 2 seconds. No rash noted. She is not diaphoretic. No pallor.  Psychiatric: She has a normal mood and affect. Her speech is normal and behavior is normal.  Nursing note and vitals reviewed.         Assessment & Plan:      ICD-10-CM   1. Benign essential HTN I10 CBC with Differential/Platelet    COMPLETE METABOLIC PANEL WITH GFR    Lipid panel   well controlled, con't meds, CMP  2. Postmenopausal estrogen deficiency Z78.0 DG Bone Density  3. At risk for loss of bone density Z91.89   4. Mixed hyperlipidemia E78.2 CBC with Differential/Platelet    COMPLETE METABOLIC PANEL WITH GFR    Lipid panel   tolerating statin, repeat FLP  5. Right hip pain M25.551 Ambulatory referral to Orthopedic Surgery  6. Osteoarthritis of right hip, unspecified osteoarthritis type M16.11 Ambulatory referral to Orthopedic Surgery  7. Need for immunization against influenza Z23 Flu Vaccine QUAD 36+ mos IM  8. Mild protein-calorie malnutrition (HCC) E44.1    continue boost, CMP, weight stable  9. Weight loss R63.4    stable, weight up 3 lbs, drinking boost        Delsa Grana, PA-C 01/22/18 10:02 AM

## 2018-01-23 ENCOUNTER — Other Ambulatory Visit: Payer: Medicare HMO

## 2018-01-23 DIAGNOSIS — E782 Mixed hyperlipidemia: Secondary | ICD-10-CM

## 2018-01-23 DIAGNOSIS — I10 Essential (primary) hypertension: Secondary | ICD-10-CM

## 2018-01-24 LAB — COMPLETE METABOLIC PANEL WITH GFR
AG Ratio: 1.3 (calc) (ref 1.0–2.5)
ALBUMIN MSPROF: 3.8 g/dL (ref 3.6–5.1)
ALT: 20 U/L (ref 6–29)
AST: 23 U/L (ref 10–35)
Alkaline phosphatase (APISO): 79 U/L (ref 33–130)
BILIRUBIN TOTAL: 0.3 mg/dL (ref 0.2–1.2)
BUN / CREAT RATIO: 22 (calc) (ref 6–22)
BUN: 25 mg/dL (ref 7–25)
CALCIUM: 9.5 mg/dL (ref 8.6–10.4)
CHLORIDE: 108 mmol/L (ref 98–110)
CO2: 25 mmol/L (ref 20–32)
Creat: 1.13 mg/dL — ABNORMAL HIGH (ref 0.60–0.93)
GFR, EST AFRICAN AMERICAN: 56 mL/min/{1.73_m2} — AB (ref >=60)
GFR, EST NON AFRICAN AMERICAN: 48 mL/min/{1.73_m2} — AB (ref >=60)
GLUCOSE: 87 mg/dL (ref 65–99)
Globulin: 3 g/dL (calc) (ref 1.9–3.7)
Potassium: 4.2 mmol/L (ref 3.5–5.3)
Sodium: 141 mmol/L (ref 135–146)
Total Protein: 6.8 g/dL (ref 6.1–8.1)

## 2018-01-24 LAB — CBC WITH DIFFERENTIAL/PLATELET
BASOS ABS: 29 {cells}/uL (ref 0–200)
Basophils Relative: 0.6 %
EOS ABS: 240 {cells}/uL (ref 15–500)
EOS PCT: 4.9 %
HEMATOCRIT: 35.4 % (ref 35.0–45.0)
HEMOGLOBIN: 11.2 g/dL — AB (ref 11.7–15.5)
LYMPHS ABS: 1735 {cells}/uL (ref 850–3900)
MCH: 23 pg — ABNORMAL LOW (ref 27.0–33.0)
MCHC: 31.6 g/dL — AB (ref 32.0–36.0)
MCV: 72.5 fL — ABNORMAL LOW (ref 80.0–100.0)
MPV: 10.4 fL (ref 7.5–12.5)
Monocytes Relative: 9.4 %
NEUTROS ABS: 2435 {cells}/uL (ref 1500–7800)
Neutrophils Relative %: 49.7 %
Platelets: 202 10*3/uL (ref 140–400)
RBC: 4.88 10*6/uL (ref 3.80–5.10)
RDW: 15.2 % — ABNORMAL HIGH (ref 11.0–15.0)
Total Lymphocyte: 35.4 %
WBC mixed population: 461 cells/uL (ref 200–950)
WBC: 4.9 10*3/uL (ref 3.8–10.8)

## 2018-01-24 LAB — LIPID PANEL
Cholesterol: 132 mg/dL (ref <200)
HDL: 32 mg/dL — AB (ref >50)
LDL Cholesterol (Calc): 78 mg/dL (calc)
Non-HDL Cholesterol (Calc): 100 mg/dL (calc) (ref <130)
TRIGLYCERIDES: 119 mg/dL (ref <150)
Total CHOL/HDL Ratio: 4.1 (calc) (ref <5.0)

## 2018-02-26 ENCOUNTER — Encounter: Payer: Self-pay | Admitting: Family Medicine

## 2018-02-26 LAB — HM DEXA SCAN

## 2018-03-07 ENCOUNTER — Encounter: Payer: Self-pay | Admitting: Family Medicine

## 2018-03-16 ENCOUNTER — Encounter: Payer: Self-pay | Admitting: *Deleted

## 2018-03-18 ENCOUNTER — Encounter: Payer: Self-pay | Admitting: Family Medicine

## 2018-03-18 ENCOUNTER — Other Ambulatory Visit: Payer: Self-pay | Admitting: Radiology

## 2018-03-18 ENCOUNTER — Encounter: Payer: Self-pay | Admitting: *Deleted

## 2018-03-25 ENCOUNTER — Ambulatory Visit: Payer: Self-pay | Admitting: Surgery

## 2018-03-25 DIAGNOSIS — C50911 Malignant neoplasm of unspecified site of right female breast: Secondary | ICD-10-CM

## 2018-03-25 NOTE — H&P (Signed)
Catherine Lowery Documented: 03/25/2018 2:13 PM Location: [facility] Patient #: [id number] DOB: 10-16-1945 Single / Language: Catherine Lowery / Race: Black or African American Female  History of Present Illness Catherine Moores A. Antonyo Hinderer MD; 03/25/2018 2:42 PM) Patient words: Patient sent at the request of Catherine Lowery for abnormal mammogram. The patient underwent screening mammography which showed a 2 cm area of architectural distortion right breast lower inner quadrant. Core biopsy showed invasive mammary carcinoma consistent with lobular carcinoma ER positive PR positive HER-2/neu negative. The patient denies any history of breast pain breast mass or nipple discharge bilaterally.  The patient is a 72 year old female.   Past Surgical History (Catherine Lowery, [place]; 03/25/2018 2:13 PM) Breast Biopsy Right. Hip Surgery Left. Knee Surgery Bilateral. Shoulder Surgery Left.  Diagnostic Studies History (Catherine Lowery, [place]; 03/25/2018 2:13 PM) Colonoscopy 5-10 years ago Mammogram within last year Pap Smear >5 years ago  Allergies (Catherine Lowery, [place]; 03/25/2018 2:14 PM) Morphine Sulfate *ANALGESICS - OPIOID* Itching. Allergies Reconciled  Medication History (Catherine Lowery, [place]; 03/25/2018 2:17 PM) Atorvastatin Calcium (40MG Tablet, Oral) Active. hydroCHLOROthiazide (12.5MG Tablet, Oral) Active. Propranolol HCl (40MG Tablet, Oral) Active. Albuterol (90MCG/ACT Aerosol Soln, Inhalation) Active. Mirtazapine (7.5MG Tablet, Oral) Active. Primidone (50MG Tablet, Oral) Active. Aspirin (325MG Tablet, Oral) Active. Medications Reconciled  Social History (Catherine Lowery, [place]; 03/25/2018 2:13 PM) Alcohol use Remotely quit alcohol use. Caffeine use Carbonated beverages, Coffee, Tea. No drug use Tobacco use Current every day smoker.  Family History (Catherine Lowery, [place]; 03/25/2018 2:13 PM) Arthritis Mother, Sister. Cancer Father. Cerebrovascular Accident  Mother. Heart Disease Mother. Heart disease in female family member before age 69 Hypertension Mother. Kidney Disease Mother. Thyroid problems Sister.  Pregnancy / Birth History (Catherine Lowery, [place]; 03/25/2018 2:13 PM) Age at menarche 5 years. Age of menopause >56 Gravida 1 Maternal age 25-20 Para 1 Regular periods  Other Problems (Catherine Lowery, [place]; 03/25/2018 2:13 PM) Breast Cancer High blood pressure Hypercholesterolemia Sickle cell disease     Review of Systems (Catherine Lowery; 03/25/2018 2:13 PM) General Not Present- Appetite Loss, Chills, Fatigue, Fever, Night Sweats, Weight Gain and Weight Loss. Skin Not Present- Change in Wart/Mole, Dryness, Hives, Jaundice, New Lesions, Non-Healing Wounds, Rash and Ulcer. HEENT Present- Wears glasses/contact lenses. Not Present- Earache, Hearing Loss, Hoarseness, Nose Bleed, Oral Ulcers, Ringing in the Ears, Seasonal Allergies, Sinus Pain, Sore Throat, Visual Disturbances and Yellow Eyes. Respiratory Not Present- Bloody sputum, Chronic Cough, Difficulty Breathing, Snoring and Wheezing. Breast Present- Breast Mass. Not Present- Breast Pain, Nipple Discharge and Skin Changes. Cardiovascular Present- Leg Cramps and Swelling of Extremities. Not Present- Chest Pain, Difficulty Breathing Lying Down, Palpitations, Rapid Heart Rate and Shortness of Breath. Gastrointestinal Not Present- Abdominal Pain, Bloating, Bloody Stool, Change in Bowel Habits, Chronic diarrhea, Constipation, Difficulty Swallowing, Excessive gas, Gets full quickly at meals, Hemorrhoids, Indigestion, Nausea, Rectal Pain and Vomiting. Female Genitourinary Not Present- Frequency, Nocturia, Painful Urination, Pelvic Pain and Urgency. Musculoskeletal Not Present- Back Pain, Joint Pain, Joint Stiffness, Muscle Pain, Muscle Weakness and Swelling of Extremities. Neurological Present- Tremor. Not Present- Decreased Memory, Fainting, Headaches, Numbness,  Seizures, Tingling, Trouble walking and Weakness. Psychiatric Not Present- Anxiety, Bipolar, Change in Sleep Pattern, Depression, Fearful and Frequent crying. Endocrine Not Present- Cold Intolerance, Excessive Hunger, Hair Changes, Heat Intolerance, Hot flashes and New Diabetes. Hematology Not Present- Blood Thinners, Easy Bruising, Excessive bleeding, Gland problems, HIV and Persistent Infections.  Vitals (Catherine Lowery; 03/25/2018 2:14 PM) 03/25/2018  2:13 PM Weight: 172.8 lb Height: 69.5in Body Surface Area: 1.95 m Body Mass Index: 25.15 kg/m  Temp.: 98.81F  Pulse: 66 (Regular)  BP: 128/82 (Sitting, Left Arm, Standard)      Physical Exam (Catherine Rog A. Alaster Asfaw MD; 03/25/2018 2:42 PM)  General Mental Status-Alert. General Appearance-Consistent with stated age. Hydration-Well hydrated. Voice-Normal.  Head and Neck Head-normocephalic, atraumatic with no lesions or palpable masses. Trachea-midline. Thyroid Gland Characteristics - normal size and consistency.  Chest and Lung Exam Chest and lung exam reveals -quiet, even and easy respiratory effort with no use of accessory muscles and on auscultation, normal breath sounds, no adventitious sounds and normal vocal resonance. Inspection Chest Wall - Normal. Back - normal.  Breast Breast - Left-Symmetric, Non Tender, No Biopsy scars, no Dimpling, No Inflammation, No Lumpectomy scars, No Mastectomy scars, No Peau d' Orange. Breast - Right-Symmetric, Non Tender, No Biopsy scars, no Dimpling, No Inflammation, No Lumpectomy scars, No Mastectomy scars, No Peau d' Orange. Breast Lump-No Palpable Breast Mass. Note: Right breast biopsy site noted  Neurologic Neurologic evaluation reveals -alert and oriented x 3 with no impairment of recent or remote memory. Mental Status-Normal.  Musculoskeletal Normal Exam - Left-Upper Extremity Strength Normal and Lower Extremity Strength Normal. Normal  Exam - Right-Upper Extremity Strength Normal and Lower Extremity Strength Normal.  Lymphatic Head & Neck  General Head & Neck Lymphatics: Bilateral - Description - Normal. Axillary  General Axillary Region: Bilateral - Description - Normal. Tenderness - Non Tender.    Assessment & Plan (Marthena Whitmyer A. Liel Rudden MD; 03/25/2018 2:43 PM)  BREAST CANCER, RIGHT (C50.911) Impression: Patient will need MRI.  The patient appears to be a good breast conserving candidate. We will plan on right breast lumpectomy with sentinel lymph node mapping. She will be referred to medical and radiation oncology for opinions. Risk of lumpectomy include bleeding, infection, seroma, more surgery, use of seed/wire, wound care, cosmetic deformity and the need for other treatments, death , blood clots, death. Pt agrees to proceed. Risk of sentinel lymph node mapping include bleeding, infection, lymphedema, shoulder pain. stiffness, dye allergy. cosmetic deformity , blood clots, death, need for more surgery. Pt agres to proceed.  Current Plans You are being scheduled for surgery- Our schedulers will call you.  You should hear from our office's scheduling department within 5 working days about the location, date, and time of surgery. We try to make accommodations for patient's preferences in scheduling surgery, but sometimes the OR schedule or the surgeon's schedule prevents [place] from making those accommodations.  If you have not heard from our office [phone] in 5 working days, call the office and ask for your surgeon's nurse.  If you have other questions about your diagnosis, plan, or surgery, call the office and ask for your surgeon's nurse.  Pt Education - [facility] Breast Cancer Information Given - [facility] "Breast Journey" Package We discussed the staging and pathophysiology of breast cancer. We discussed all of the different options for treatment for breast cancer including surgery, chemotherapy, radiation therapy,  Herceptin, and antiestrogen therapy. We discussed a sentinel lymph node biopsy as she does not appear to having lymph node involvement right now. We discussed the performance of that with injection of radioactive tracer and blue dye. We discussed that she would have an incision underneath her axillary hairline. We discussed that there is a bout a 10-20% chance of having a positive node with a sentinel lymph node biopsy and we will await the permanent pathology to make any other first  further decisions in terms of her treatment. One of these options might be to return to the operating room to perform an axillary lymph node dissection. We discussed about a 1-2% risk lifetime of chronic shoulder pain as well as lymphedema associated with a sentinel lymph node biopsy. We discussed the options for treatment of the breast cancer which included lumpectomy versus a mastectomy. We discussed the performance of the lumpectomy with a wire placement. We discussed a 10-20% chance of a positive margin requiring reexcision in the operating room. We also discussed that she may need radiation therapy or antiestrogen therapy or both if she undergoes lumpectomy. We discussed the mastectomy and the postoperative care for that as well. We discussed that there is no difference in her survival whether she undergoes lumpectomy with radiation therapy or antiestrogen therapy versus a mastectomy. There is a slight difference in the local recurrence rate being 3-5% with lumpectomy and about 1% with a mastectomy. We discussed the risks of operation including bleeding, infection, possible reoperation. She understands her further therapy will be based on what her stages at the time of her operation.  Pt Education - [facility]b breast cancer surgery: discussed with patient and provided information. Pt Education - [facility] Breast Biopsy HCI: discussed with patient and provided information. Pt Education - ABC (After Breast Cancer) Class Info: discussed  with patient and provided information.

## 2018-03-26 ENCOUNTER — Encounter: Payer: Self-pay | Admitting: Oncology

## 2018-03-26 ENCOUNTER — Telehealth: Payer: Self-pay | Admitting: Oncology

## 2018-03-26 ENCOUNTER — Other Ambulatory Visit: Payer: Self-pay | Admitting: Surgery

## 2018-03-26 DIAGNOSIS — C50911 Malignant neoplasm of unspecified site of right female breast: Secondary | ICD-10-CM

## 2018-03-26 NOTE — Telephone Encounter (Signed)
New referral received from Dr. Brantley Stage for invasive mammary carcinoma. Pt has been cld and scheduled to see Dr. Jana Hakim on 12/12 at 4pm. Letter mailed.

## 2018-03-31 DIAGNOSIS — C801 Malignant (primary) neoplasm, unspecified: Secondary | ICD-10-CM

## 2018-03-31 HISTORY — DX: Malignant (primary) neoplasm, unspecified: C80.1

## 2018-04-08 ENCOUNTER — Telehealth: Payer: Self-pay | Admitting: *Deleted

## 2018-04-08 NOTE — Telephone Encounter (Signed)
   [facility] Pre-operative Risk Assessment    Request for surgical clearance:  1. What type of surgery is being performed? Right total hip replacement   2. When is this surgery scheduled? TBD   3. What type of clearance is required (medical clearance vs. Pharmacy clearance to hold med vs. Both)? medical  4. Are there any medications that need to be held prior to surgery and how long?none   5. Practice name and name of physician performing surgery? [facility]-dr tim murphy   6. What is your office phone number [phone]    7.   What is your office fax number [phone]  8.   Anesthesia type (None, local, MAC, general) ? unknown   Catherine Lowery 04/08/2018, 5:23 PM  _________________________________________________________________   (provider comments below)

## 2018-04-09 NOTE — Progress Notes (Signed)
Location of Breast Cancer: RIGHT breast lower inner quadrant  Histology per Pathology Report: 03/18/18:  Diagnosis Breast, right, needle core biopsy, 5 o'clock, 5cmfn - INVASIVE MAMMARY CARCINOMA, GRADE 2-3.  Receptor Status: ER(95%), PR (95%), Her2-neu (negative), Ki-(5%)  Did patient present with symptoms (if so, please note symptoms) or was this found on screening mammography?: screening mammogram which showed a 2 cm area of architectural distortion right breast lower inner quadrant.  Past/Anticipated interventions by surgeon, if any: 03/18/18:  Core biopsy  05/07/18: RIGHT BREAST LUMPECTOMY WITH RADIOACTIVE SEED AND RIGHT SENTINEL LYMPH NODE MAPPING Cornett, Thomas, MD  Past/Anticipated interventions by medical oncology, if any: Chemotherapy Initial consult with Dr. Magrinat 04/11/18 at 1600  Lymphedema issues, if any:  Pt has not had surgery at this time.   Pain issues, if any:  Pt denies c/o pain.  SAFETY ISSUES:  Prior radiation? No  Pacemaker/ICD? No  Possible current pregnancy? No  Is the patient on methotrexate? No  Current Complaints / other details:  Pt presents today for initial consult with Dr. Kinard for Radiation Oncology. Pt is accompanied by sister.   BP (!) 131/59 (BP Location: Right Arm, Patient Position: Sitting)   Pulse (!) 50   Temp 98.3 F (36.8 C) (Oral)   Resp 20   Wt 173 lb 12.8 oz (78.8 kg)   LMP 01/24/2000   SpO2 100%   BMI 25.30 kg/m   Wt Readings from Last 3 Encounters:  04/11/18 173 lb 12.8 oz (78.8 kg)  01/22/18 174 lb (78.9 kg)  10/31/17 171 lb (77.6 kg)       Jill W Pfefferkorn, RN 04/11/2018,2:22 PM    

## 2018-04-10 ENCOUNTER — Ambulatory Visit
Admission: RE | Admit: 2018-04-10 | Discharge: 2018-04-10 | Disposition: A | Discharge status: Home / Self Care | Payer: Medicare HMO | Source: Ambulatory Visit | Attending: Surgery | Admitting: Surgery

## 2018-04-10 DIAGNOSIS — C50911 Malignant neoplasm of unspecified site of right female breast: Secondary | ICD-10-CM

## 2018-04-10 MED ORDER — GADOBUTROL 1 MMOL/ML IV SOLN
8.0000 mL | Freq: Once | INTRAVENOUS | Status: AC | PRN
  Administered 2018-04-10: 8 mL via INTRAVENOUS

## 2018-04-10 NOTE — Progress Notes (Signed)
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 She underwent unilateral right diagnostic mammography with tomography and right breast ultrasonography at [facility] on 03/07/2018 showing: Breast Density Category B. On mammography, there is a new 1.5 cm irregular architectural distortion with a spiculated margin in the right breast at 5 o'clock anterior depth. On ultrasound, there is a 2.7 cm irregular solid mass in the right breast at 5 o'clock middle depth. This irregular mass displays posterior acoustic shadowing. No significant abnormalities were seen sonographically in the right axilla.  Accordingly on 03/18/2018 she proceeded to biopsy of the right breast area in question. The pathology from this procedure showed ([id number]): Invasive mammary carcinoma e-cadherin negative, grade II-III. 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 Prognostic indicators significant for: estrogen receptor, 95% positive and progesterone receptor, 95% positive, both with strong staining intensity. Proliferation marker Ki67 at 5%. HER2 negative by immunohistochemistry (1+).  On 04/10/2018 the patient underwent bilateral breast MRI with and without contrast showing a 2.3 cm spiculated enhancing  mass associated with non-masslike enhancement, the total abnormal area measuring 4.6 cm.  There was no evidence of lymph node abnormality and the contralateral breast was benign appearing  The patient's subsequent history is as detailed below.   INTERVAL HISTORY: Catherine Lowery was evaluated in the breast cancer clinic on 04/11/2018 accompanied by her sister, Catherine Lowery.   Her case was also presented at the multidisciplinary breast cancer conference on 03/27/2018.  At that time a preliminary plan was proposed: Consider breast MRI for this lobular breast cancer, otherwise breast conserving surgery with sentinel lymph node sampling, Oncotype testing, adjuvant radiation, adjuvant antiestrogens, and genetics testing  Catherine Lowery's last bone density screening on 02/26/2018, showed a T-score of 1.4, which is considered normal.    REVIEW OF SYSTEMS: Catherine Lowery is doing well overall. There were no specific symptoms leading to the original mammogram, which was routinely scheduled. The patient denies unusual headaches, visual changes, nausea, vomiting, stiff neck, dizziness, or gait imbalance. There has been no cough, phlegm production, or pleurisy, no chest pain or pressure, and no change in bowel or bladder habits. The patient denies fever, rash, bleeding, unexplained fatigue or unexplained weight loss. A detailed review of systems was otherwise entirely negative.   PAST MEDICAL HISTORY: Past Medical History:  Diagnosis Date  . DJD (degenerative joint disease)    LEFT HIP  . Hyperlipidemia    takes Simvasatin daily  . Hypertension    takes Propranolol and HCTZ daioly  . Hyperthyroidism   . Pneumonia 10/2015  . PONV (postoperative nausea and vomiting)   . Shortness of breath dyspnea    with ambulation  . Tremors of nervous system    takes Primidone daily     PAST SURGICAL HISTORY: Past Surgical History:  Procedure Laterality Date  . BOne Spur     left heel  . COLONOSCOPY    .  JOINT REPLACEMENT Bilateral     knee  . KNEE SURGERY    . ROTATOR CUFF REPAIR Bilateral   . 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 TOTAL HIP ARTHROPLASTY Left 12/29/2015   Procedure: TOTAL HIP ARTHROPLASTY ANTERIOR APPROACH;  Surgeon: Ninetta Lights, MD;  Location: [facility];  Service: Orthopedics;  Laterality: Left;     FAMILY HISTORY Family History  Problem Relation Age of Onset  . Hypertension Mother   . Heart disease Mother   . Stroke Mother   . Cancer Father   . Cancer Paternal Aunt        unknown  . Cancer Paternal Uncle        prostate  . Cancer Paternal Aunt        ovarian   She notes that her father died from a multiple myeloma at age 32. Patients' mother died from stroke at age 33. The patient has 1 brother and 2 sisters. One paternal aunt had ovarian cancer.  One cousin had breast cancer, and her 46s. Another paternal aunt had cancer, type unknown to the patient, and a paternal uncle had prostate cancer.    GYNECOLOGIC HISTORY:  Menarche: 72 years old Age at first live birth: 72 years old GX P: 1 LMP: Patient's last menstrual period was 01/24/2000. 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 Before that, she has been employed at [employer], [employer], and as an elderly caregiver. 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 She is separated, and has not spoken to her husband, Catherine Lowery, in over 10 years. She lives with her only son, Catherine Lowery, who is disabled secondary to SCD and multiple surgeries. She has no pets. She attends [employer].    ADVANCED DIRECTIVES: Her sister, Catherine Lowery, is Alizeh's medical power of attorney. Catherine Lowery can be reached at [phone]. Catherine Lowery has stated that her husband, Catherine Lowery, is not to have access to her medical records or to make decisions on her behalf. ]   HEALTH MAINTENANCE: Social History    Tobacco Use  . Smoking status: Current Every Day Smoker    Packs/day: 0.50    Years: 20.00    Pack years: 10.00  . Smokeless tobacco: Never Used  . Tobacco comment: 06/06/16 Pt stated "I'm trying to quit"  Substance Use Topics  . Alcohol use: No    Alcohol/week: 0.0 standard drinks  . Drug use: No     Colonoscopy: 01/2015  PAP:   Bone density:  02/26/2018: T-score 1.4   Allergies  Allergen Reactions  . Morphine And Related Itching  . Other Itching and Nausea And Vomiting    "PAIN MEDICINES"  . Percocet [Oxycodone-Acetaminophen] Nausea And Vomiting    Current Outpatient Medications  Medication Sig Dispense Refill  . aspirin 325 MG tablet Take 325 mg by mouth daily. Stop date 01/30/16    . atorvastatin (LIPITOR) 40 MG tablet Take 1 tablet (40 mg total) by mouth daily. 90 tablet 3  . Cholecalciferol (VITAMIN D3) 2000 units TABS Take 1 tablet by mouth daily. 30 tablet 11  . hydrochlorothiazide (MICROZIDE) 12.5 MG capsule Take 1 capsule (12.5 mg total) by mouth every morning. 90 capsule 2  . primidone (MYSOLINE) 50 MG tablet Take 2 tablets (100 mg total) by mouth every morning. 180 tablet 4  . propranolol (INDERAL) 40 MG  tablet Take 1 tablet (40 mg total) by mouth 2 (two) times daily. 180 tablet 1  . VOLTAREN 1 % GEL APPLY TOPICALLY 4 TIMES DAILY AS NEEDED 100 g 0  . albuterol (PROVENTIL HFA;VENTOLIN HFA) 108 (90 Base) MCG/ACT inhaler Inhale 2 puffs into the lungs every 6 (six) hours as needed for wheezing or shortness of breath. (Patient not taking: Reported on 04/11/2018) 1 Inhaler 0   No current facility-administered medications for this visit.      OBJECTIVE: Middle-aged African-American woman in no acute distress  Vitals:   04/11/18 1541  BP: 137/68  Pulse: 62  Resp: 18  Temp: 98.2 F (36.8 C)  SpO2: 100%     Body mass index is 25.25 kg/m.   Wt Readings from Last 3 Encounters:  04/11/18 173 lb 8 oz (78.7 kg)  04/11/18 173 lb 12.8 oz (78.8 kg)  01/22/18 174 lb  (78.9 kg)      ECOG FS:0 - Asymptomatic  Ocular: Sclerae unicteric, pupils round and equal Ear-nose-throat: Oropharynx clear and moist Lymphatic: No cervical or supraclavicular adenopathy Lungs no rales or rhonchi Heart regular rate and rhythm Abd soft, nontender, positive bowel sounds MSK no focal spinal tenderness, no joint edema Neuro: non-focal, well-oriented, appropriate affect Breasts: There is a mass in the right breast palpable most easily just below and deep to the areolar area.  It is movable.  There is no overlying skin involvement.  The left breast is benign.  Both axillae are benign   LAB RESULTS:  CMP     Component Value Date/Time   NA 141 01/23/2018 0804   K 4.2 01/23/2018 0804   CL 108 01/23/2018 0804   CO2 25 01/23/2018 0804   GLUCOSE 87 01/23/2018 0804   BUN 25 01/23/2018 0804   CREATININE 1.13 (H) 01/23/2018 0804   CALCIUM 9.5 01/23/2018 0804   PROT 6.8 01/23/2018 0804   ALBUMIN 3.6 09/12/2017 0100   AST 23 01/23/2018 0804   ALT 20 01/23/2018 0804   ALKPHOS 86 09/12/2017 0100   BILITOT 0.3 01/23/2018 0804   GFRNONAA 48 (L) 01/23/2018 0804   GFRAA 56 (L) 01/23/2018 0804    No results found for: TOTALPROTELP, ALBUMINELP, A1GS, A2GS, BETS, BETA2SER, GAMS, MSPIKE, SPEI  No results found for: KPAFRELGTCHN, LAMBDASER, KAPLAMBRATIO  Lab Results  Component Value Date   WBC 4.9 01/23/2018   NEUTROABS 2,435 01/23/2018   HGB 11.2 (L) 01/23/2018   HCT 35.4 01/23/2018   MCV 72.5 (L) 01/23/2018   PLT 202 01/23/2018    '@LASTCHEMISTRY'$ @  No results found for: LABCA2  No components found for: [id number]  No results for input(s): INR in the last 168 hours.  No results found for: LABCA2  No results found for: OVF643  No results found for: PIR518  No results found for: ACZ660  No results found for: CA2729  No components found for: HGQUANT  No results found for: CEA1 / No results found for: CEA1   No results found for: AFPTUMOR  No results  found for: CHROMOGRNA  No results found for: PSA1  No visits with results within 3 Day(s) from this visit.  Latest known visit with results is:  Abstract on 03/16/2018  Component Date Value Ref Range Status  . HM Dexa Scan 02/26/2018 WNL   Final    (this displays the last labs from the last 3 days)  No results found for: TOTALPROTELP, ALBUMINELP, A1GS, A2GS, BETS, BETA2SER, GAMS, MSPIKE, SPEI (this displays SPEP labs)  No results found  for: KPAFRELGTCHN, LAMBDASER, KAPLAMBRATIO (kappa/lambda light chains)  No results found for: HGBA, HGBA2QUANT, HGBFQUANT, HGBSQUAN (Hemoglobinopathy evaluation)   No results found for: LDH  No results found for: IRON, TIBC, IRONPCTSAT (Iron and TIBC)  No results found for: FERRITIN  Urinalysis    Component Value Date/Time   COLORURINE YELLOW 09/21/2017 1533   APPEARANCEUR CLOUDY (A) 09/21/2017 1533   LABSPEC 1.015 09/21/2017 1533   PHURINE 5.5 09/21/2017 1533   GLUCOSEU NEGATIVE 09/21/2017 1533   HGBUR TRACE (A) 09/21/2017 1533   BILIRUBINUR SMALL (A) 03/24/2011 1054   KETONESUR NEGATIVE 09/21/2017 1533   PROTEINUR NEGATIVE 09/21/2017 1533   UROBILINOGEN 0.2 03/24/2011 1054   NITRITE NEGATIVE 09/21/2017 1533   LEUKOCYTESUR TRACE (A) 09/21/2017 1533     STUDIES:  Mr Breast Bilateral W Wo Contrast Inc Cad  Result Date: 04/10/2018 CLINICAL DATA:  72 year old female with biopsy proven right breast cancer in October 2019. LABS:  Creatinine was obtained on site at [facility] at [address] Results: Creatinine 1.1 mg/dL. EXAM: BILATERAL BREAST MRI WITH AND WITHOUT CONTRAST TECHNIQUE: Multiplanar, multisequence MR images of both breasts were obtained prior to and following the intravenous administration of 8 ml of Gadavist Three-dimensional MR images were rendered by post-processing of the original MR data on an independent workstation. The three-dimensional MR images were interpreted, and findings are reported in the  following complete MRI report for this study. Three dimensional images were evaluated at the independent DynaCad workstation COMPARISON:  Previous exam(s). FINDINGS: Breast composition: b. Scattered fibroglandular tissue. Background parenchymal enhancement: Moderate. Right breast: There is a 2.3 x 1.9 x 1.1 cm enhancing mass with associated signal void from post biopsy clip in the central inferior right breast. This is consistent with the patient's biopsy-proven malignancy. Confluent non mass enhancement and scattered enhancing foci is seen surrounding the mass, spanning a total of 4.6 x 3.5 x 2.2 cm (AP by transverse by craniocaudal dimensions). No additional suspicious enhancing masses are identified within the right breast. Left breast: No mass or abnormal enhancement. Lymph nodes: No abnormal appearing lymph nodes. Ancillary findings:  None. IMPRESSION: 1. Spiculated, enhancing central right breast mass consistent with the patient's biopsy-proven malignancy. There is associated confluent non-mass enhancement and scattered enhancing foci surrounding the mass. This spans a total area 4.6 x 3.5 x 2.2 cm (AP by transverse by craniocaudal dimensions). 2. No MRI evidence of malignancy on the left. RECOMMENDATION: Continue treatment plan. BI-RADS CATEGORY  6: Known biopsy-proven malignancy. Electronically Signed   By: Kristopher Oppenheim M.D.   On: 04/10/2018 13:15    ELIGIBLE FOR AVAILABLE RESEARCH PROTOCOL: no   ASSESSMENT: 72 y.o. [employer], [place] woman status post right breast lower inner quadrant biopsy 03/18/2018 for a clinical T2N0, [name], grade 2 or 3, estrogen and progesterone receptor positive, [name], with an MIB-1 of 5%  (1) genetics testing pending: no need to delay surgery until genetics results available  (2) definitive surgery pending  (3) Oncotype DX or MammaPrint to be obtained from the definitive surgical sample  (4) adjuvant radiation as  appropriate  (5) antiestrogens to start at the completion of local treatment  PLAN: I spent approximately 60 minutes face to face with Catherine Lowery with more than 50% of that time spent in counseling and coordination of care. Specifically we reviewed the biology of the patient's diagnosis and the specifics of her situation. We first reviewed the fact that cancer is not one disease but more than 100  different diseases and that it is important to keep them separate-- otherwise when friends and relatives discuss their own cancer experiences with Yasmyn confusion can result. Similarly we explained that if breast cancer spreads to the bone or liver, the patient would not have bone cancer or liver cancer, but breast cancer in the bone and breast cancer in the liver: one cancer in three places-- not 3 different cancers which otherwise would have to be treated in 3 different ways.  We noted that lobular breast cancers lack E-cadherin and therefore tend to be more "scattered" and harder to detect by mammography.  That is why it appeared in the MRI so much larger than in the mammogram.  It also means that there may be occult node involvement despite the negative appearance of the lymph nodes and that surgical margins may be positive despite optimal initial surgery  We discussed the difference between local and systemic therapy. In terms of loco-regional treatment, lumpectomy plus radiation is equivalent to mastectomy as far as survival is concerned. For this reason, and because the cosmetic results are generally superior, we recommend breast conserving surgery. We also noted that in terms of sequencing of treatments, whether systemic therapy or surgery is done first does not affect the ultimate outcome.  We then discussed the rationale for systemic therapy. There is some risk that this cancer may have already spread to other parts of her body. Patients frequently ask at this point about bone scans, CAT scans and PET scans  to find out if they have occult breast cancer somewhere else. The problem is that in early stage disease we are much more likely to find false positives then true cancers and this would expose the patient to unnecessary procedures as well as unnecessary radiation. Scans cannot answer the question the patient really would like to know, which is whether she has microscopic disease elsewhere in her body. For those reasons we do not recommend them.  Of course we would proceed to aggressive evaluation of any symptoms that might suggest metastatic disease, but that is not the case here.  Next we went over the options for systemic therapy which are anti-estrogens, anti-HER-2 immunotherapy, and chemotherapy. Noni does not meet criteria for anti-HER-2 immunotherapy. She is a good candidate for anti-estrogens.  The question of chemotherapy is more complicated. Chemotherapy is most effective in rapidly growing, aggressive tumors. It is much less effective in low-grade, slow growing cancers, like Amandy 's. For that reason we are going to request an Oncotype from the definitive surgical sample, as suggested by NCCN guidelines.  If there is nodal positivity we will request a MammaPrint.  That will help [place] make a definitive decision regarding chemotherapy in this case.  Riely also qualifies for genetics testing. In patients who carry a deleterious mutation [for example in a  BRCA gene], the risk of a new breast cancer developing in the future may be sufficiently great that the patient may choose bilateral mastectomies. However if she wishes to keep her breasts in that situation it is safe to do so. That would require intensified screening, which generally means not only yearly mammography but a yearly breast MRI as well.  Veleka is very clear that if she does carry a mutation she would not want bilateral mastectomies but would opt for intensified screening  Overall then the plan is to start with surgery, follow-up with  chemotherapy if indicated, then radiation, then antiestrogens.  Mikenzie has a good understanding of the overall plan. She agrees  with it. She knows the goal of treatment in her case is cure. She will call with any problems that may develop before her next visit here.   Magrinat, Virgie Dad, MD  04/11/18 4:36 PM Medical Oncology and Hematology [facility] [address] [place], [place] [address] Tel. [phone]    Fax. [phone]    I, Jacqualyn Posey am acting as a Education administrator for Chauncey Cruel, MD.   I, [name] MD, have reviewed the above documentation for accuracy and completeness, and I agree with the above.

## 2018-04-11 ENCOUNTER — Inpatient Hospital Stay: Payer: Medicare HMO | Attending: Oncology | Admitting: Oncology

## 2018-04-11 ENCOUNTER — Ambulatory Visit
Admission: RE | Admit: 2018-04-11 | Discharge: 2018-04-11 | Disposition: A | Discharge status: Home / Self Care | Payer: Medicare HMO | Source: Ambulatory Visit | Attending: Radiation Oncology | Admitting: Radiation Oncology

## 2018-04-11 ENCOUNTER — Telehealth: Payer: Self-pay | Admitting: Oncology

## 2018-04-11 ENCOUNTER — Encounter: Payer: Self-pay | Admitting: Oncology

## 2018-04-11 ENCOUNTER — Encounter: Payer: Self-pay | Admitting: Radiation Oncology

## 2018-04-11 ENCOUNTER — Other Ambulatory Visit: Payer: Self-pay

## 2018-04-11 ENCOUNTER — Encounter (INDEPENDENT_AMBULATORY_CARE_PROVIDER_SITE_OTHER): Payer: Self-pay

## 2018-04-11 VITALS — BP 131/59 | HR 50 | Temp 98.3°F | Resp 20 | Wt 173.8 lb

## 2018-04-11 DIAGNOSIS — Z803 Family history of malignant neoplasm of breast: Secondary | ICD-10-CM | POA: Diagnosis not present

## 2018-04-11 DIAGNOSIS — Z809 Family history of malignant neoplasm, unspecified: Secondary | ICD-10-CM

## 2018-04-11 DIAGNOSIS — Z8042 Family history of malignant neoplasm of prostate: Secondary | ICD-10-CM | POA: Diagnosis not present

## 2018-04-11 DIAGNOSIS — Z170 Estrogen receptor positive status [ER+]: Secondary | ICD-10-CM | POA: Diagnosis not present

## 2018-04-11 DIAGNOSIS — F17210 Nicotine dependence, cigarettes, uncomplicated: Secondary | ICD-10-CM | POA: Diagnosis not present

## 2018-04-11 DIAGNOSIS — Z8041 Family history of malignant neoplasm of ovary: Secondary | ICD-10-CM | POA: Diagnosis not present

## 2018-04-11 DIAGNOSIS — C50311 Malignant neoplasm of lower-inner quadrant of right female breast: Secondary | ICD-10-CM | POA: Diagnosis present

## 2018-04-11 DIAGNOSIS — Z807 Family history of other malignant neoplasms of lymphoid, hematopoietic and related tissues: Secondary | ICD-10-CM | POA: Insufficient documentation

## 2018-04-11 DIAGNOSIS — Z720 Tobacco use: Secondary | ICD-10-CM | POA: Insufficient documentation

## 2018-04-11 NOTE — Progress Notes (Signed)
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 female who is accompanied by her sister. The patient presented for her annual mammogram on 02/26/18, which showed a 1.9 cm distortion in the right breast. She proceeded to diagnostic mammogram and ultrasound on 03/07/18 showing a 2.7 cm irregular solid mass. Accordingly, she was referred to Dr. Brantley Stage and underwent biopsy of the right lower inner quadrant on 03/18/18. Pathology from the procedure revealed: invasive mammary carcinoma, grade 2-3, and mammary carcinoma in situ, intermediate nuclear grade. Separate staining report showed: ER+ (95%, strong), PR+ (95%, strong), Her-2 negative, Ki67 5%, and lobular phenotype.  Bilateral breast MRI was performed yesterday, 04/10/18, and showed: spiculated, enhancing central right breast mass with associated confluent non-mass enhancement and scattered enhancing foci surrounding the mass. She is scheduled to meet with Dr. Jana Hakim later today and will undergo right breast lumpectomy on 05/07/18 under Dr. Brantley Stage.  She has been kindly referred to [place] to discuss radiation therapy treatment options.     PREVIOUS RADIATION THERAPY: No  PAST MEDICAL HISTORY:  has a past medical history of DJD (degenerative joint disease), Hyperlipidemia, Hypertension, Hyperthyroidism, Pneumonia (10/2015), PONV (postoperative nausea and vomiting), Shortness of breath dyspnea, and Tremors of nervous system.    PAST SURGICAL HISTORY: Past Surgical History:  Procedure Laterality Date  . BOne Spur     left heel  .  COLONOSCOPY    . JOINT REPLACEMENT Bilateral    knee  . KNEE SURGERY    . ROTATOR CUFF REPAIR Bilateral   . 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 TOTAL HIP ARTHROPLASTY Left 12/29/2015   Procedure: TOTAL HIP ARTHROPLASTY ANTERIOR APPROACH;  Surgeon: Ninetta Lights, MD;  Location: [facility];  Service: Orthopedics;  Laterality: Left;    FAMILY HISTORY: family history includes Cancer in her father, paternal aunt, paternal aunt, and paternal uncle; Heart disease in her mother; Hypertension in her mother; Stroke in her mother.  SOCIAL HISTORY:  reports that she has been smoking. She has a 10.00 pack-year smoking history. She has never used smokeless tobacco. She reports that she does not drink alcohol or use drugs.  She worked for [employer] for 8 years in [place], then chose to live there after.  ALLERGIES: Morphine and related; Other; and Percocet [oxycodone-acetaminophen]  MEDICATIONS:  Current Outpatient Medications  Medication Sig Dispense Refill  . aspirin 325 MG tablet Take 325 mg by mouth daily. Stop date 01/30/16    . atorvastatin (LIPITOR) 40 MG tablet Take 1 tablet (40 mg total) by mouth daily. 90 tablet 3  . Cholecalciferol (VITAMIN D3) 2000 units TABS Take 1 tablet by mouth daily. 30 tablet 11  . hydrochlorothiazide (MICROZIDE) 12.5 MG capsule Take 1 capsule (12.5 mg total) by mouth every morning. 90 capsule 2  . primidone (MYSOLINE) 50 MG tablet Take 2 tablets (100 mg total) by mouth every morning. 180 tablet 4  . propranolol (INDERAL) 40 MG tablet Take 1 tablet (40 mg total) by mouth 2 (two) times daily. 180 tablet 1  . VOLTAREN 1 % GEL APPLY TOPICALLY 4 TIMES DAILY AS  NEEDED 100 g 0  . albuterol (PROVENTIL HFA;VENTOLIN HFA) 108 (90 Base) MCG/ACT inhaler Inhale 2 puffs into the lungs every 6 (six) hours as needed for wheezing or shortness of breath. (Patient not taking: Reported on 04/11/2018) 1 Inhaler 0   No current facility-administered medications for this encounter.      REVIEW OF SYSTEMS:  A 10+ POINT REVIEW OF SYSTEMS WAS OBTAINED including neurology, dermatology, psychiatry, cardiac, respiratory, lymph, extremities, GI, GU, musculoskeletal, constitutional, reproductive, HEENT. She denies pain and any other issues at this time.    PHYSICAL EXAM:  weight is 173 lb 12.8 oz (78.8 kg). Her oral temperature is 98.3 F (36.8 C). Her blood pressure is 131/59 (abnormal) and her pulse is 50 (abnormal). Her respiration is 20 and oxygen saturation is 100%.   General: Alert and oriented, in no acute distress HEENT: Head is normocephalic. Extraocular movements are intact. Oropharynx is clear. Poor dentition, several teeth missing. Neck: Neck is supple, no palpable cervical or supraclavicular lymphadenopathy. Heart: Regular in rate and rhythm with no murmurs, rubs, or gallops. Chest: Clear to auscultation bilaterally, with no rhonchi, wheezes, or rales. Abdomen: Soft, nontender, nondistended, with no rigidity or guarding. Extremities: No cyanosis. Edema in ankle and foot areas bilaterally. Lymphatics: see Neck Exam Skin: No concerning lesions. Musculoskeletal: symmetric strength and muscle tone throughout. Neurologic: Cranial nerves II through XII are grossly intact. No obvious focalities. Speech is fluent. Coordination is intact. Psychiatric: Judgment and insight are intact. Affect is appropriate. Left Breast: no palpable mass, nipple discharge or bleeding. Right Breast: Patient has a palpable mass in the LIQ, estimated to be approximately 2.5 x 3 cm.   ECOG = 0  0 - Asymptomatic (Fully active, able to carry on all predisease activities without restriction)  1 - Symptomatic but completely ambulatory (Restricted in physically strenuous activity but ambulatory and able to carry out work of a light or sedentary nature. For example, light housework, office work)  2 - Symptomatic, <50% in bed during the day (Ambulatory and capable of all self care but unable to  carry out any work activities. Up and about more than 50% of waking hours)  3 - Symptomatic, >50% in bed, but not bedbound (Capable of only limited self-care, confined to bed or chair 50% or more of waking hours)  4 - Bedbound (Completely disabled. Cannot carry on any self-care. Totally confined to bed or chair)  5 - Death   Eustace Pen MM, Creech RH, Tormey DC, et al. [phone]). "Toxicity and response criteria of the [facility]". [place] Oncol. 5 (6): 649-55  LABORATORY DATA:  Lab Results  Component Value Date   WBC 4.9 01/23/2018   HGB 11.2 (L) 01/23/2018   HCT 35.4 01/23/2018   MCV 72.5 (L) 01/23/2018   PLT 202 01/23/2018   NEUTROABS 2,435 01/23/2018   Lab Results  Component Value Date   NA 141 01/23/2018   K 4.2 01/23/2018   CL 108 01/23/2018   CO2 25 01/23/2018   GLUCOSE 87 01/23/2018   CREATININE 1.13 (H) 01/23/2018   CALCIUM 9.5 01/23/2018      RADIOGRAPHY: Mr Breast Bilateral W Wo Contrast Inc Cad  Result Date: 04/10/2018 CLINICAL DATA:  72 year old female with biopsy proven right breast cancer in October 2019. LABS:  Creatinine was obtained on site at [place] [facility] at [address] Results: Creatinine 1.1 mg/dL. EXAM: BILATERAL BREAST MRI WITH AND WITHOUT CONTRAST TECHNIQUE: Multiplanar, multisequence MR images of both breasts were obtained prior  to and following the intravenous administration of 8 ml of Gadavist Three-dimensional MR images were rendered by post-processing of the original MR data on an independent workstation. The three-dimensional MR images were interpreted, and findings are reported in the following complete MRI report for this study. Three dimensional images were evaluated at the independent DynaCad workstation COMPARISON:  Previous exam(s). FINDINGS: Breast composition: b. Scattered fibroglandular tissue. Background parenchymal enhancement: Moderate. Right breast: There is a 2.3 x 1.9 x 1.1 cm enhancing mass with  associated signal void from post biopsy clip in the central inferior right breast. This is consistent with the patient's biopsy-proven malignancy. Confluent non mass enhancement and scattered enhancing foci is seen surrounding the mass, spanning a total of 4.6 x 3.5 x 2.2 cm (AP by transverse by craniocaudal dimensions). No additional suspicious enhancing masses are identified within the right breast. Left breast: No mass or abnormal enhancement. Lymph nodes: No abnormal appearing lymph nodes. Ancillary findings:  None. IMPRESSION: 1. Spiculated, enhancing central right breast mass consistent with the patient's biopsy-proven malignancy. There is associated confluent non-mass enhancement and scattered enhancing foci surrounding the mass. This spans a total area 4.6 x 3.5 x 2.2 cm (AP by transverse by craniocaudal dimensions). 2. No MRI evidence of malignancy on the left. RECOMMENDATION: Continue treatment plan. BI-RADS CATEGORY  6: Known biopsy-proven malignancy. Electronically Signed   By: Kristopher Oppenheim M.D.   On: 04/10/2018 13:15      IMPRESSION: Anatomic Stage IIa, Prognostic Stage Ib, cN0, M0 Invasive Lobular Carcinoma of Right LIQ, ER+ / PR+ / Her-2 neg, Grade 2-3.  Patient would be a good candidate for breast conservation with radiation therapy directed at the right breast. We also discussed mastectomy as potential treatment, but she is more interested in breast conserving surgery. We discussed the importance of stopping smoking, and she seems interested in pursuing this.  Today, I talked to the patient and sister about the findings and work-up thus far.  We discussed the natural history of breast cancer and general treatment, highlighting the role of radiotherapy in the management.  We discussed the available radiation techniques, and focused on the details of logistics and delivery.  We reviewed the anticipated acute and late sequelae associated with radiation in this setting.  The patient was  encouraged to ask questions that I answered to the best of my ability.   PLAN:  Patient will see Dr. Jana Hakim later today for medical oncology evaluation. Patient will proceed with lumpectomy/SN surgery on 05/07/2018. Follow-up approximately 2-3 weeks later to further discuss adjuvant radiation therapy.    ------------------------------------------------  Blair Promise, PhD, MD  This document serves as a record of services personally performed by Gery Pray, MD. It was created on his behalf by Wilburn Mylar, a trained medical scribe. The creation of this record is based on the scribe's personal observations and the provider's statements to them. This document has been checked and approved by the attending provider.

## 2018-04-11 NOTE — Telephone Encounter (Signed)
Scheduled appt per 12/12 sch message - unable to reach patient - left message with appt date and time - sent reminder letter in the mail

## 2018-04-12 ENCOUNTER — Encounter: Payer: Self-pay | Admitting: *Deleted

## 2018-04-12 ENCOUNTER — Telehealth: Payer: Self-pay | Admitting: Oncology

## 2018-04-12 NOTE — Telephone Encounter (Signed)
Per 12/12 no los °

## 2018-04-15 NOTE — Telephone Encounter (Signed)
   Primary Cardiologist:No primary care provider on file.  Chart reviewed as part of pre-operative protocol coverage. Because of Catherine Lowery's past medical history and time since last visit, he/she will require a follow-up visit in order to better assess preoperative cardiovascular risk.  Pre-op covering staff: - Please schedule appointment and call patient to inform them. - Please contact requesting surgeon's office via preferred method (i.e, phone, fax) to inform them of need for appointment prior to surgery.  If applicable, this message will also be routed to pharmacy pool and/or primary cardiologist for input on holding anticoagulant/antiplatelet agent as requested below so that this information is available at time of patient's appointment.   Cecilie Kicks, NP  04/15/2018, 5:12 PM

## 2018-04-16 NOTE — Telephone Encounter (Signed)
Pt is scheduled to see Almyra Deforest on 04/30/18, Pt states that she is not having a hip replacement she is having a breast lumpectomy . She states that she will call the office doing her surgery and have them fax over a clearance form.

## 2018-04-26 ENCOUNTER — Encounter (HOSPITAL_BASED_OUTPATIENT_CLINIC_OR_DEPARTMENT_OTHER): Payer: Self-pay | Admitting: *Deleted

## 2018-04-26 ENCOUNTER — Other Ambulatory Visit: Payer: Self-pay

## 2018-04-29 NOTE — Progress Notes (Signed)
Cardiology Office Note   Date:  04/30/2018   ID:  Catherine Lowery, DOB [birth year], MRN [id number]  PCP:  Alycia Rossetti, MD  Cardiologist:  Pixie Casino, MD EP: None  Chief Complaint  Patient presents with  . Pre-op Exam      History of Present Illness: Catherine Lowery is a 72 y.o. female with a PMH of HTN, HLD, atypical chest pain with myoview negative for ischemia in 2017, hyperthyroidism s/p radioactive iodine ablation, and tobacco abuse, who presents for preoperative assessment prior to lumpectomy surgery scheduled for 05/07/18.   She was last seen by cardiology, Dr. Debara Pickett, 03/2017, at which time she was without cardiac complaints. No medication changes occurred at this visit and she was recommended to follow-up with Dr. Debara Pickett in 1 year or sooner as needed. Since that time she was found to have an abnormality on her routine screening mammography and underwent biopsy of the right breast which revealed invasive mammary carcinoma. She has seen oncology outpatient and the plan is for surgery followed by chemotherapy/radiation/antiestrogens pending surgical results.   She presents alone today for pre-operative assessment. She has been doing well from a cardiac standpoint over the past year. No complaints of chest pain, DOE, or SOB at rest. She denies palpitations, dizziness, lightheadedness, syncope, orthopnea, or PND. She has intermittent LE edema in the evenings which resolves by morning. 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 She is able to walk 2 blocks easily without CP or SOB, as well as go up a flight of stair, and perform household chores without difficulty. She [place] in [place] new year.       Past Medical History:  Diagnosis Date  . Cancer ([place]) 03/2018   right breast cancer  . Chronic kidney disease    CKD  . DJD (degenerative joint disease)    LEFT HIP  . Hyperlipidemia    takes Simvasatin  daily  . Hypertension    takes Propranolol and HCTZ daioly  . Hyperthyroidism   . Pneumonia 10/2015  . PONV (postoperative nausea and vomiting)    when ether used  . Shortness of breath dyspnea    with ambulation  . Tremors of nervous system    takes Primidone daily    Past Surgical History:  Procedure Laterality Date  . BOne Spur     left heel  . COLONOSCOPY    . JOINT REPLACEMENT Bilateral    knee  . KNEE SURGERY    . ROTATOR CUFF REPAIR Bilateral   . 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 TOTAL HIP ARTHROPLASTY Left 12/29/2015   Procedure: TOTAL HIP ARTHROPLASTY ANTERIOR APPROACH;  Surgeon: Ninetta Lights, MD;  Location: [facility];  Service: Orthopedics;  Laterality: Left;     Current Outpatient Medications  Medication Sig Dispense Refill  . albuterol (PROVENTIL HFA;VENTOLIN HFA) 108 (90 Base) MCG/ACT inhaler Inhale 2 puffs into the lungs every 6 (six) hours as needed for wheezing or shortness of breath. 1 Inhaler 0  . aspirin 325 MG tablet Take 325 mg by mouth daily. Stop date 01/30/16    . atorvastatin (LIPITOR) 40 MG tablet Take 1 tablet (40 mg total) by mouth daily. 90 tablet 3  . Cholecalciferol (VITAMIN D3) 2000 units TABS Take 1 tablet by mouth daily. 30 tablet 11  . hydrochlorothiazide (MICROZIDE) 12.5 MG capsule Take 1 capsule (12.5 mg total) by mouth  every morning. 90 capsule 2  . Omega-3 Fatty Acids (FISH OIL) 1000 MG CPDR Take by mouth.    . primidone (MYSOLINE) 50 MG tablet Take 2 tablets (100 mg total) by mouth every morning. 180 tablet 4  . propranolol (INDERAL) 40 MG tablet Take 1 tablet (40 mg total) by mouth 2 (two) times daily. 180 tablet 1  . VOLTAREN 1 % GEL APPLY TOPICALLY 4 TIMES DAILY AS NEEDED 100 g 0   No current facility-administered medications for this visit.     Allergies:   Morphine and related; Other; and Percocet [oxycodone-acetaminophen]    Social History:  The patient  reports that she has been smoking. She has a 10.00 pack-year  smoking history. She has never used smokeless tobacco. She reports that she does not drink alcohol or use drugs.   Family History:  The patient's family history includes Cancer in her father, paternal aunt, paternal aunt, and paternal uncle; Heart disease in her mother; Hypertension in her mother; Stroke in her mother.    ROS:  Please see the history of present illness.   Otherwise, review of systems are positive for none.   All other systems are reviewed and negative.    PHYSICAL EXAM: VS:  BP 138/70 (BP Location: Right Arm, Patient Position: Sitting)   Pulse (!) 47   Ht 5\' 9"  (1.753 m)   Wt 174 lb 9.6 oz (79.2 kg)   LMP 01/24/2000   BMI 25.78 kg/m  , BMI Body mass index is 25.78 kg/m. GEN: Well nourished, well developed, in no acute distress HEENT: sclera anicteric Neck: no JVD, carotid bruits, or masses Cardiac: bradycardic, regular rhythm; no murmurs, rubs, or gallops, no edema  Respiratory:  clear to auscultation bilaterally, normal work of breathing GI: soft, nontender, nondistended, + BS MS: no deformity or atrophy Skin: warm and dry, no rash Neuro:  Strength and sensation are intact Psych: euthymic mood, full affect   EKG:  EKG is ordered today. The ekg ordered today demonstrates sinus bradycardia, rate 47, no STE/D, some non-specific T wave flattening.    Recent Labs: 09/10/2017: TSH 2.27 09/21/2017: Brain Natriuretic Peptide 21 01/23/2018: ALT 20; BUN 25; Creat 1.13; Hemoglobin 11.2; Platelets 202; Potassium 4.2; Sodium 141    Lipid Panel    Component Value Date/Time   CHOL 132 01/23/2018 0804   TRIG 119 01/23/2018 0804   HDL 32 (L) 01/23/2018 0804   CHOLHDL 4.1 01/23/2018 0804   VLDL 31 (H) 11/20/2016 1607   LDLCALC 78 01/23/2018 0804      Wt Readings from Last 3 Encounters:  04/30/18 174 lb 9.6 oz (79.2 kg)  04/11/18 173 lb 12.8 oz (78.8 kg)  04/11/18 173 lb 8 oz (78.7 kg)      Other studies Reviewed: Additional studies/ records that were reviewed  today include:  Echocardiogram 01/2015: Study Conclusions  - Left ventricle: The cavity size was normal. There was mild   concentric hypertrophy. Systolic function was normal. The   estimated ejection fraction was in the range of 55% to 60%. Wall   motion was normal; there were no regional wall motion   abnormalities. Doppler parameters are consistent with abnormal   left ventricular relaxation (grade 1 diastolic dysfunction). - Left atrium: The atrium was mildly dilated. - Right ventricle: The cavity size was normal. Wall thickness was   normal. Systolic function was normal. - Tricuspid valve: There was no regurgitation. - Pulmonic valve: Transvalvular velocity was within the normal   range. There was  no evidence for stenosis. - Inferior vena cava: The vessel was normal in size. The   respirophasic diameter changes were in the normal range (>= 50%),   consistent with normal central venous pressure.  NST 12/2014: IMPRESSION: 1. No reversible ischemia or infarction.  2. Normal left ventricular wall motion.  3. Left ventricular ejection fraction 47%  4. Low-risk stress test findings*.    ASSESSMENT AND PLAN:   1. Preoperative risk assessment: patient is planned for right breast lumpectomy and sentinel node biopsy 05/07/18 for management of recently diagnosed breast cancer. Her last ischemic evaluation was a NST in 2017 which showed no ischemia but EF 47% - felt to be gating abnormality. Subsequent echo in 2016 with EF 55-60%, no wall motion abnormalities, and G1DD. She is able to easily complete 4 METS without chest pain or SOB. No prior history of CAD, CHF, CVA, DM, or significant CKD.  - Based on her revised cardiac index, she has a score of 0 with a 0.4% risk of a major cardiac event in the perioperative setting.  - No further cardiac evaluation necessary at this time - she is at an acceptable risk for planned lumpectomy.  - She was instructed to stop her aspirin starting  yesterday - she does not recall why she is on aspirin 325mg  but states she had tried 81mg  in the past but was not effective. No contraindication for holding aspirin from a cardiac standpoint. She can resume when cleared by her surgeon. - Will route this information to Dr. Brantley Stage with [facility] for preoperative clearance.   2. HTN: BP mildly elevated today at 138/70s. She checks her blood pressure at home and it typically has SBPs in the 120s.  - Continue propranolol and hydrochlorothiazide   3. HLD: Tcholesterol 132, HDL 32, LDL 78 on labs 12/2017 - Continue atorvastatin 40mg  daily.   4. Tobacco abuse: she has not smoked in 2 days in an effort quit. Since her breast cancer diagnosis she has cut back to 1/2 pack per 2 days. Congratulated her on her efforts.  - Continue to encourage smoking cessation.      Current medicines are reviewed at length with the patient today.  The patient does not have concerns regarding medicines.  The following changes have been made:  no change  Labs/ tests ordered today include: None No orders of the defined types were placed in this encounter.    Disposition:   FU with Dr. Debara Pickett in 1 year or sooner if needed.   Signed, Abigail Butts, PA-C  04/30/2018 8:58 AM

## 2018-04-30 ENCOUNTER — Telehealth: Payer: Self-pay

## 2018-04-30 ENCOUNTER — Ambulatory Visit: Payer: Medicare HMO | Admitting: Medical

## 2018-04-30 ENCOUNTER — Encounter: Payer: Self-pay | Admitting: Physician Assistant

## 2018-04-30 VITALS — BP 138/70 | HR 47 | Ht 69.0 in | Wt 174.6 lb

## 2018-04-30 DIAGNOSIS — Z01810 Encounter for preprocedural cardiovascular examination: Secondary | ICD-10-CM | POA: Diagnosis not present

## 2018-04-30 DIAGNOSIS — I10 Essential (primary) hypertension: Secondary | ICD-10-CM

## 2018-04-30 DIAGNOSIS — E785 Hyperlipidemia, unspecified: Secondary | ICD-10-CM

## 2018-04-30 DIAGNOSIS — Z720 Tobacco use: Secondary | ICD-10-CM

## 2018-04-30 NOTE — Patient Instructions (Signed)
Medication Instructions:  The current medical regimen is effective;  continue present plan and medications.  If you need a refill on your cardiac medications before your next appointment, please call your pharmacy.   Follow-Up: At [facility], you and your health needs are our priority.  As part of our continuing mission to provide you with exceptional heart care, we have created designated Provider Care Teams.  These Care Teams include your primary Cardiologist (physician) and Advanced Practice Providers (APPs -  Physician Assistants and Nurse Practitioners) who all work together to provide you with the care you need, when you need it. You will need a follow up appointment in 12 months.  Please call our office 2 months in advance to schedule this appointment.  You may see Dr.[name] or one of the following Advanced Practice Providers on your designated Care Team: Almyra Deforest, [place] . Fabian Sharp, PA-C

## 2018-04-30 NOTE — Telephone Encounter (Signed)
Called the Surgery department for patient, left a message with medical records to see if they had received the information for the preop assessment for this patient. Also to disregard the note on Melrose Nakayama, will call office back to verify if they received information on patient for preop.

## 2018-04-30 NOTE — Telephone Encounter (Signed)
-----   Message from Abigail Butts, PA-C sent at 04/30/2018 10:08 AM EST ----- Regarding: Preop Please call Dr. Josetta Huddle office with [facility] [phone]) to confirm they have received the requested preop assessment for Ms. Grandville Silos.  Side note - accidentally forwarded Abdullah's note to them as well. Can you let them know to disregard that one :)  Thanks!  Daleen Snook

## 2018-05-03 ENCOUNTER — Encounter (HOSPITAL_BASED_OUTPATIENT_CLINIC_OR_DEPARTMENT_OTHER)
Admission: RE | Admit: 2018-05-03 | Discharge: 2018-05-03 | Disposition: A | Discharge status: Home / Self Care | Payer: Medicare HMO | Source: Ambulatory Visit | Attending: Surgery | Admitting: Surgery

## 2018-05-03 DIAGNOSIS — Z01812 Encounter for preprocedural laboratory examination: Secondary | ICD-10-CM | POA: Insufficient documentation

## 2018-05-03 LAB — COMPREHENSIVE METABOLIC PANEL
ALT: 22 U/L (ref 0–44)
AST: 21 U/L (ref 15–41)
Albumin: 3.6 g/dL (ref 3.5–5.0)
Alkaline Phosphatase: 72 U/L (ref 38–126)
Anion gap: 6 (ref 5–15)
BUN: 14 mg/dL (ref 8–23)
CO2: 27 mmol/L (ref 22–32)
Calcium: 9.1 mg/dL (ref 8.9–10.3)
Chloride: 107 mmol/L (ref 98–111)
Creatinine, Ser: 1.18 mg/dL — ABNORMAL HIGH (ref 0.44–1.00)
GFR calc Af Amer: 53 mL/min — ABNORMAL LOW (ref >60)
GFR calc non Af Amer: 45 mL/min — ABNORMAL LOW (ref >60)
Glucose, Bld: 95 mg/dL (ref 70–99)
Potassium: 4.6 mmol/L (ref 3.5–5.1)
Sodium: 140 mmol/L (ref 135–145)
Total Bilirubin: 0.5 mg/dL (ref 0.3–1.2)
Total Protein: 6.9 g/dL (ref 6.5–8.1)

## 2018-05-03 LAB — CBC WITH DIFFERENTIAL/PLATELET
Abs Immature Granulocytes: 0 10*3/uL (ref 0.00–0.07)
BASOS ABS: 0.2 10*3/uL — AB (ref 0.0–0.1)
Basophils Relative: 5 %
Eosinophils Absolute: 0 10*3/uL (ref 0.0–0.5)
Eosinophils Relative: 1 %
HEMATOCRIT: 36.5 % (ref 36.0–46.0)
Hemoglobin: 11.4 g/dL — ABNORMAL LOW (ref 12.0–15.0)
Lymphocytes Relative: 63 %
Lymphs Abs: 2.8 10*3/uL (ref 0.7–4.0)
MCH: 22.3 pg — ABNORMAL LOW (ref 26.0–34.0)
MCHC: 31.2 g/dL (ref 30.0–36.0)
MCV: 71.4 fL — ABNORMAL LOW (ref 80.0–100.0)
Monocytes Absolute: 0.2 10*3/uL (ref 0.1–1.0)
Monocytes Relative: 4 %
Neutro Abs: 1.2 10*3/uL — ABNORMAL LOW (ref 1.7–7.7)
Neutrophils Relative %: 27 %
Platelets: 183 10*3/uL (ref 150–400)
RBC: 5.11 MIL/uL (ref 3.87–5.11)
RDW: 15 % (ref 11.5–15.5)
WBC: 4.5 10*3/uL (ref 4.0–10.5)
nRBC: 0 % (ref 0.0–0.2)

## 2018-05-03 NOTE — Progress Notes (Signed)
Ensure pre surgery drink given with instructions to complete by 0715 dos, surgical soap given with instructions, pt verbalized understanding. 

## 2018-05-06 DIAGNOSIS — D0501 Lobular carcinoma in situ of right breast: Secondary | ICD-10-CM | POA: Diagnosis not present

## 2018-05-06 DIAGNOSIS — F17200 Nicotine dependence, unspecified, uncomplicated: Secondary | ICD-10-CM | POA: Diagnosis not present

## 2018-05-06 DIAGNOSIS — E7800 Pure hypercholesterolemia, unspecified: Secondary | ICD-10-CM | POA: Diagnosis not present

## 2018-05-06 DIAGNOSIS — D571 Sickle-cell disease without crisis: Secondary | ICD-10-CM | POA: Diagnosis not present

## 2018-05-06 DIAGNOSIS — C50311 Malignant neoplasm of lower-inner quadrant of right female breast: Secondary | ICD-10-CM | POA: Diagnosis present

## 2018-05-06 DIAGNOSIS — Z79899 Other long term (current) drug therapy: Secondary | ICD-10-CM | POA: Diagnosis not present

## 2018-05-06 DIAGNOSIS — Z170 Estrogen receptor positive status [ER+]: Secondary | ICD-10-CM | POA: Diagnosis not present

## 2018-05-06 DIAGNOSIS — C773 Secondary and unspecified malignant neoplasm of axilla and upper limb lymph nodes: Secondary | ICD-10-CM | POA: Diagnosis not present

## 2018-05-06 DIAGNOSIS — I10 Essential (primary) hypertension: Secondary | ICD-10-CM | POA: Diagnosis not present

## 2018-05-06 NOTE — Progress Notes (Signed)
Reviewed CMP, CBC with Diff results with Dr. Gifford Shave.  Okay'd to proceed with surgery as scheduled.  Edyth Gunnels, RN

## 2018-05-07 ENCOUNTER — Ambulatory Visit (HOSPITAL_BASED_OUTPATIENT_CLINIC_OR_DEPARTMENT_OTHER): Payer: Medicare HMO | Admitting: Anesthesiology

## 2018-05-07 ENCOUNTER — Ambulatory Visit (HOSPITAL_BASED_OUTPATIENT_CLINIC_OR_DEPARTMENT_OTHER)
Admission: RE | Admit: 2018-05-07 | Discharge: 2018-05-07 | Disposition: A | Discharge status: Home / Self Care | Payer: Medicare HMO | Attending: Surgery | Admitting: Surgery

## 2018-05-07 ENCOUNTER — Encounter (HOSPITAL_BASED_OUTPATIENT_CLINIC_OR_DEPARTMENT_OTHER): Payer: Self-pay | Admitting: Anesthesiology

## 2018-05-07 ENCOUNTER — Encounter (HOSPITAL_BASED_OUTPATIENT_CLINIC_OR_DEPARTMENT_OTHER)
Admission: RE | Disposition: A | Discharge status: Home / Self Care | Payer: Self-pay | Source: Home / Self Care | Attending: Surgery

## 2018-05-07 ENCOUNTER — Ambulatory Visit (HOSPITAL_COMMUNITY)
Admission: RE | Admit: 2018-05-07 | Discharge: 2018-05-07 | Disposition: A | Discharge status: Home / Self Care | Payer: Medicare HMO | Source: Ambulatory Visit | Attending: Surgery | Admitting: Surgery

## 2018-05-07 ENCOUNTER — Other Ambulatory Visit (INDEPENDENT_AMBULATORY_CARE_PROVIDER_SITE_OTHER): Payer: Medicare HMO

## 2018-05-07 DIAGNOSIS — I10 Essential (primary) hypertension: Secondary | ICD-10-CM

## 2018-05-07 DIAGNOSIS — C773 Secondary and unspecified malignant neoplasm of axilla and upper limb lymph nodes: Secondary | ICD-10-CM | POA: Insufficient documentation

## 2018-05-07 DIAGNOSIS — Z170 Estrogen receptor positive status [ER+]: Secondary | ICD-10-CM | POA: Diagnosis not present

## 2018-05-07 DIAGNOSIS — C50911 Malignant neoplasm of unspecified site of right female breast: Secondary | ICD-10-CM

## 2018-05-07 DIAGNOSIS — E7800 Pure hypercholesterolemia, unspecified: Secondary | ICD-10-CM | POA: Insufficient documentation

## 2018-05-07 DIAGNOSIS — D571 Sickle-cell disease without crisis: Secondary | ICD-10-CM | POA: Insufficient documentation

## 2018-05-07 DIAGNOSIS — D0501 Lobular carcinoma in situ of right breast: Secondary | ICD-10-CM | POA: Diagnosis not present

## 2018-05-07 DIAGNOSIS — F17200 Nicotine dependence, unspecified, uncomplicated: Secondary | ICD-10-CM | POA: Insufficient documentation

## 2018-05-07 DIAGNOSIS — Z79899 Other long term (current) drug therapy: Secondary | ICD-10-CM | POA: Insufficient documentation

## 2018-05-07 HISTORY — DX: Malignant (primary) neoplasm, unspecified: C80.1

## 2018-05-07 HISTORY — PX: BREAST LUMPECTOMY WITH RADIOACTIVE SEED AND SENTINEL LYMPH NODE BIOPSY: SHX6550

## 2018-05-07 HISTORY — DX: Chronic kidney disease, unspecified: N18.9

## 2018-05-07 SURGERY — BREAST LUMPECTOMY WITH RADIOACTIVE SEED AND SENTINEL LYMPH NODE BIOPSY
Anesthesia: Regional | Site: Breast | Laterality: Right

## 2018-05-07 MED ORDER — GABAPENTIN 300 MG PO CAPS
ORAL_CAPSULE | ORAL | Status: AC
  Filled 2018-05-07: qty 1

## 2018-05-07 MED ORDER — CHLORHEXIDINE GLUCONATE CLOTH 2 % EX PADS: 6.0000 | MEDICATED_PAD | Freq: Once | CUTANEOUS | Status: DC

## 2018-05-07 MED ORDER — SODIUM CHLORIDE (PF) 0.9 % IJ SOLN
INTRAMUSCULAR | Status: AC
  Filled 2018-05-07: qty 10

## 2018-05-07 MED ORDER — LIDOCAINE HCL (CARDIAC) PF 100 MG/5ML IV SOSY
PREFILLED_SYRINGE | INTRAVENOUS | Status: DC | PRN
  Administered 2018-05-07: 80 mg via INTRAVENOUS

## 2018-05-07 MED ORDER — BUPIVACAINE-EPINEPHRINE (PF) 0.25% -1:200000 IJ SOLN
INTRAMUSCULAR | Status: AC
  Filled 2018-05-07: qty 30

## 2018-05-07 MED ORDER — TECHNETIUM TC 99M SULFUR COLLOID FILTERED
1.0000 | Freq: Once | INTRAVENOUS | Status: AC | PRN
  Administered 2018-05-07: 1 via INTRADERMAL

## 2018-05-07 MED ORDER — MIDAZOLAM HCL 2 MG/2ML IJ SOLN
1.0000 mg | INTRAMUSCULAR | Status: DC | PRN
  Administered 2018-05-07: 2 mg via INTRAVENOUS

## 2018-05-07 MED ORDER — CELECOXIB 400 MG PO CAPS
400.0000 mg | ORAL_CAPSULE | ORAL | Status: AC
  Administered 2018-05-07: 400 mg via ORAL

## 2018-05-07 MED ORDER — BUPIVACAINE-EPINEPHRINE (PF) 0.25% -1:200000 IJ SOLN
INTRAMUSCULAR | Status: DC | PRN
  Administered 2018-05-07: 25 mL

## 2018-05-07 MED ORDER — PROPOFOL 10 MG/ML IV BOLUS
INTRAVENOUS | Status: DC | PRN
  Administered 2018-05-07: 80 mg via INTRAVENOUS

## 2018-05-07 MED ORDER — LACTATED RINGERS IV SOLN
INTRAVENOUS | Status: DC
  Administered 2018-05-07 (×3): via INTRAVENOUS

## 2018-05-07 MED ORDER — CELECOXIB 200 MG PO CAPS
ORAL_CAPSULE | ORAL | Status: AC
  Filled 2018-05-07: qty 2

## 2018-05-07 MED ORDER — FENTANYL CITRATE (PF) 100 MCG/2ML IJ SOLN
INTRAMUSCULAR | Status: AC
  Filled 2018-05-07: qty 2

## 2018-05-07 MED ORDER — METHYLENE BLUE 0.5 % INJ SOLN
INTRAVENOUS | Status: AC
  Filled 2018-05-07: qty 10

## 2018-05-07 MED ORDER — ONDANSETRON HCL 4 MG PO TABS: 4.0000 mg | ORAL_TABLET | Freq: Every day | ORAL | 1 refills | Status: DC | PRN

## 2018-05-07 MED ORDER — MIDAZOLAM HCL 2 MG/2ML IJ SOLN
INTRAMUSCULAR | Status: AC
  Filled 2018-05-07: qty 2

## 2018-05-07 MED ORDER — EPINEPHRINE PF 1 MG/10ML IJ SOSY
PREFILLED_SYRINGE | INTRAMUSCULAR | Status: DC | PRN
  Administered 2018-05-07: 100 ug via INTRAVENOUS

## 2018-05-07 MED ORDER — IBUPROFEN 800 MG PO TABS: 800.0000 mg | ORAL_TABLET | Freq: Three times a day (TID) | ORAL | 0 refills | Status: DC | PRN

## 2018-05-07 MED ORDER — ONDANSETRON HCL 4 MG/2ML IJ SOLN
INTRAMUSCULAR | Status: DC | PRN
  Administered 2018-05-07: 4 mg via INTRAVENOUS

## 2018-05-07 MED ORDER — DEXAMETHASONE SODIUM PHOSPHATE 4 MG/ML IJ SOLN
INTRAMUSCULAR | Status: DC | PRN
  Administered 2018-05-07: 10 mg via INTRAVENOUS

## 2018-05-07 MED ORDER — GABAPENTIN 300 MG PO CAPS
300.0000 mg | ORAL_CAPSULE | ORAL | Status: AC
  Administered 2018-05-07: 300 mg via ORAL

## 2018-05-07 MED ORDER — FENTANYL CITRATE (PF) 100 MCG/2ML IJ SOLN
50.0000 ug | INTRAMUSCULAR | Status: AC | PRN
  Administered 2018-05-07: 25 ug via INTRAVENOUS
  Administered 2018-05-07 (×2): 50 ug via INTRAVENOUS
  Administered 2018-05-07: 25 ug via INTRAVENOUS

## 2018-05-07 MED ORDER — HYDROCODONE-ACETAMINOPHEN 5-325 MG PO TABS: 1.0000 | ORAL_TABLET | Freq: Four times a day (QID) | ORAL | 0 refills | Status: DC | PRN

## 2018-05-07 MED ORDER — CEFAZOLIN SODIUM-DEXTROSE 2-4 GM/100ML-% IV SOLN
INTRAVENOUS | Status: AC
  Filled 2018-05-07: qty 100

## 2018-05-07 MED ORDER — SCOPOLAMINE 1 MG/3DAYS TD PT72: 1.0000 | MEDICATED_PATCH | Freq: Once | TRANSDERMAL | Status: DC | PRN

## 2018-05-07 MED ORDER — DEXTROSE 5 % IV SOLN
3.0000 g | INTRAVENOUS | Status: AC
  Administered 2018-05-07: 2 g via INTRAVENOUS

## 2018-05-07 MED ORDER — BUPIVACAINE HCL (PF) 0.5 % IJ SOLN
INTRAMUSCULAR | Status: DC | PRN
  Administered 2018-05-07: 30 mL via PERINEURAL

## 2018-05-07 MED ORDER — CLONIDINE HCL (ANALGESIA) 100 MCG/ML EP SOLN
EPIDURAL | Status: DC | PRN
  Administered 2018-05-07: 50 ug

## 2018-05-07 SURGICAL SUPPLY — 53 items
APPLIER CLIP 9.375 MED OPEN (MISCELLANEOUS) ×3
BINDER BREAST LRG (GAUZE/BANDAGES/DRESSINGS) IMPLANT
BINDER BREAST MEDIUM (GAUZE/BANDAGES/DRESSINGS) IMPLANT
BINDER BREAST XLRG (GAUZE/BANDAGES/DRESSINGS) ×2 IMPLANT
BINDER BREAST XXLRG (GAUZE/BANDAGES/DRESSINGS) IMPLANT
BLADE SURG 15 STRL LF DISP TIS (BLADE) ×1 IMPLANT
BLADE SURG 15 STRL SS (BLADE) ×2
CANISTER SUC SOCK COL 7IN (MISCELLANEOUS) IMPLANT
CANISTER SUCT 1200ML W/VALVE (MISCELLANEOUS) ×3 IMPLANT
CHLORAPREP W/TINT 26ML (MISCELLANEOUS) ×3 IMPLANT
CLIP APPLIE 9.375 MED OPEN (MISCELLANEOUS) ×1 IMPLANT
COVER BACK TABLE 60X90IN (DRAPES) ×3 IMPLANT
COVER MAYO STAND STRL (DRAPES) ×3 IMPLANT
COVER PROBE W GEL 5X96 (DRAPES) ×3 IMPLANT
COVER WAND RF STERILE (DRAPES) IMPLANT
DECANTER SPIKE VIAL GLASS SM (MISCELLANEOUS) IMPLANT
DERMABOND ADVANCED (GAUZE/BANDAGES/DRESSINGS) ×2
DERMABOND ADVANCED .7 DNX12 (GAUZE/BANDAGES/DRESSINGS) ×1 IMPLANT
DEVICE DUBIN W/COMP PLATE 8390 (MISCELLANEOUS) ×2 IMPLANT
DRAPE LAPAROSCOPIC ABDOMINAL (DRAPES) ×3 IMPLANT
DRAPE UTILITY XL STRL (DRAPES) ×3 IMPLANT
ELECT COATED BLADE 2.86 ST (ELECTRODE) ×3 IMPLANT
ELECT REM PT RETURN 9FT ADLT (ELECTROSURGICAL) ×3
ELECTRODE REM PT RTRN 9FT ADLT (ELECTROSURGICAL) ×1 IMPLANT
GLOVE BIO SURGEON STRL SZ 6.5 (GLOVE) ×1 IMPLANT
GLOVE BIO SURGEONS STRL SZ 6.5 (GLOVE) ×1
GLOVE BIOGEL PI IND STRL 7.0 (GLOVE) IMPLANT
GLOVE BIOGEL PI IND STRL 8 (GLOVE) ×1 IMPLANT
GLOVE BIOGEL PI INDICATOR 7.0 (GLOVE) ×2
GLOVE BIOGEL PI INDICATOR 8 (GLOVE) ×2
GLOVE ECLIPSE 8.0 STRL XLNG CF (GLOVE) ×3 IMPLANT
GOWN STRL REUS W/ TWL LRG LVL3 (GOWN DISPOSABLE) ×2 IMPLANT
GOWN STRL REUS W/TWL LRG LVL3 (GOWN DISPOSABLE) ×4
HEMOSTAT ARISTA ABSORB 3G PWDR (MISCELLANEOUS) IMPLANT
HEMOSTAT SNOW SURGICEL 2X4 (HEMOSTASIS) IMPLANT
KIT MARKER MARGIN INK (KITS) ×3 IMPLANT
NDL HYPO 25X1 1.5 SAFETY (NEEDLE) ×1 IMPLANT
NDL SAFETY ECLIPSE 18X1.5 (NEEDLE) IMPLANT
NEEDLE HYPO 18GX1.5 SHARP (NEEDLE)
NEEDLE HYPO 25X1 1.5 SAFETY (NEEDLE) ×3 IMPLANT
NS IRRIG 1000ML POUR BTL (IV SOLUTION) ×3 IMPLANT
PACK BASIN DAY SURGERY FS (CUSTOM PROCEDURE TRAY) ×3 IMPLANT
PENCIL BUTTON HOLSTER BLD 10FT (ELECTRODE) ×3 IMPLANT
SLEEVE SCD COMPRESS KNEE MED (MISCELLANEOUS) ×3 IMPLANT
SPONGE LAP 4X18 RFD (DISPOSABLE) ×3 IMPLANT
SUT MNCRL AB 4-0 PS2 18 (SUTURE) ×3 IMPLANT
SUT VICRYL 3-0 CR8 SH (SUTURE) ×3 IMPLANT
SYR CONTROL 10ML LL (SYRINGE) ×3 IMPLANT
TOWEL GREEN STERILE FF (TOWEL DISPOSABLE) ×3 IMPLANT
TOWEL OR NON WOVEN STRL DISP B (DISPOSABLE) ×3 IMPLANT
TUBE CONNECTING 20'X1/4 (TUBING) ×1
TUBE CONNECTING 20X1/4 (TUBING) ×2 IMPLANT
YANKAUER SUCT BULB TIP NO VENT (SUCTIONS) ×3 IMPLANT

## 2018-05-07 NOTE — Anesthesia Procedure Notes (Addendum)
Anesthesia Regional Block: Pectoralis block   Pre-Anesthetic Checklist: ,, timeout performed, Correct Patient, Correct Site, Correct Laterality, Correct Procedure, Correct Position, site marked, Risks and benefits discussed,  Surgical consent,  Pre-op evaluation,  At surgeon's request and post-op pain management  Laterality: Right  Prep: Maximum Sterile Barrier Precautions used, chloraprep       Needles:  Injection technique: Single-shot  Needle Type: Echogenic Stimulator Needle     Needle Length: 9cm  Needle Gauge: 22     Additional Needles:   Procedures:,,,, ultrasound used (permanent image in chart),,,,  Narrative:  Start time: 05/07/2018 10:05 AM End time: 05/07/2018 10:15 AM Injection made incrementally with aspirations every 5 mL.  Performed by: Personally  Anesthesiologist: Freddrick March, MD  Additional Notes: Monitors applied. No increased pain on injection. No increased resistance to injection. Injection made in 5cc increments. Good needle visualization. Patient tolerated procedure well.

## 2018-05-07 NOTE — Discharge Instructions (Signed)
[facility] °Office Phone Number [phone] ° °BREAST BIOPSY/ PARTIAL MASTECTOMY: POST OP INSTRUCTIONS ° °Always review your discharge instruction sheet given to you by the facility where your surgery was performed. ° °IF YOU HAVE DISABILITY OR FAMILY LEAVE FORMS, YOU MUST BRING THEM TO THE OFFICE FOR PROCESSING.  DO NOT GIVE THEM TO YOUR DOCTOR. ° °1. A prescription for pain medication may be given to you upon discharge.  Take your pain medication as prescribed, if needed.  If narcotic pain medicine is not needed, then you may take acetaminophen (Tylenol) or ibuprofen (Advil) as needed. °2. Take your usually prescribed medications unless otherwise directed °3. If you need a refill on your pain medication, please contact your pharmacy.  They will contact our office to request authorization.  Prescriptions will not be filled after 5pm or on week-ends. °4. You should eat very light the first 24 hours after surgery, such as soup, crackers, pudding, etc.  Resume your normal diet the day after surgery. °5. Most patients will experience some swelling and bruising in the breast.  Ice packs and a good support bra will help.  Swelling and bruising can take several days to resolve.  °6. It is common to experience some constipation if taking pain medication after surgery.  Increasing fluid intake and taking a stool softener will usually help or prevent this problem from occurring.  A mild laxative (Milk of Magnesia or Miralax) should be taken according to package directions if there are no bowel movements after 48 hours. °7. Unless discharge instructions indicate otherwise, you may remove your bandages 24-48 hours after surgery, and you may shower at that time.  You may have steri-strips (small skin tapes) in place directly over the incision.  These strips should be left on the skin for 7-10 days.  If your surgeon used skin glue on the incision, you may shower in 24 hours.  The glue will flake off over the  next 2-3 weeks.  Any sutures or staples will be removed at the office during your follow-up visit. °8. ACTIVITIES:  You may resume regular daily activities (gradually increasing) beginning the next day.  Wearing a good support bra or sports bra minimizes pain and swelling.  You may have sexual intercourse when it is comfortable. °a. You may drive when you no longer are taking prescription pain medication, you can comfortably wear a seatbelt, and you can safely maneuver your car and apply brakes. °b. RETURN TO WORK:  ______________________________________________________________________________________ °9. You should see your doctor in the office for a follow-up appointment approximately two weeks after your surgery.  Your doctor’s nurse will typically make your follow-up appointment when she calls you with your pathology report.  Expect your pathology report 2-3 business days after your surgery.  You may call to check if you do not hear from us after three days. °10. OTHER INSTRUCTIONS: _______________________________________________________________________________________________ _____________________________________________________________________________________________________________________________________ °_____________________________________________________________________________________________________________________________________ °_____________________________________________________________________________________________________________________________________ ° °WHEN TO CALL YOUR DOCTOR: °1. Fever over 101.0 °2. Nausea and/or vomiting. °3. Extreme swelling or bruising. °4. Continued bleeding from incision. °5. Increased pain, redness, or drainage from the incision. ° °The clinic staff is available to answer your questions during regular business hours.  Please don’t hesitate to call and ask to speak to one of the nurses for clinical concerns.  If you have a medical emergency, go to the nearest  emergency room or call 911.  A surgeon from [facility] is always on call at the hospital. ° °For further questions, please visit [email or web address]  ° ° ° ° °  Post Anesthesia Home Care Instructions ° °Activity: °Get plenty of rest for the remainder of the day. A responsible individual must stay with you for 24 hours following the procedure.  °For the next 24 hours, DO NOT: °-Drive a car °-Operate machinery °-Drink alcoholic beverages °-Take any medication unless instructed by your physician °-Make any legal decisions or sign important papers. ° °Meals: °Start with liquid foods such as gelatin or soup. Progress to regular foods as tolerated. Avoid greasy, spicy, heavy foods. If nausea and/or vomiting occur, drink only clear liquids until the nausea and/or vomiting subsides. Call your physician if vomiting continues. ° °Special Instructions/Symptoms: °Your throat may feel dry or sore from the anesthesia or the breathing tube placed in your throat during surgery. If this causes discomfort, gargle with warm salt water. The discomfort should disappear within 24 hours. ° °If you had a scopolamine patch placed behind your ear for the management of post- operative nausea and/or vomiting: ° °1. The medication in the patch is effective for 72 hours, after which it should be removed.  Wrap patch in a tissue and discard in the trash. Wash hands thoroughly with soap and water. °2. You may remove the patch earlier than 72 hours if you experience unpleasant side effects which may include dry mouth, dizziness or visual disturbances. °3. Avoid touching the patch. Wash your hands with soap and water after contact with the patch. °  ° °

## 2018-05-07 NOTE — H&P (Signed)
[facility] Patient #: [id number] DOB: [birth year] Single / Language: [name] / Race: Black or African American Female  History of Present Illness  Patient words: Patient sent at the request of Dr. Georgiann Cocker for abnormal mammogram. The patient underwent screening mammography which showed a 2 cm area of architectural distortion right breast lower inner quadrant. Core biopsy showed invasive mammary carcinoma consistent with lobular carcinoma ER positive PR positive HER-2/neu negative. The patient denies any history of breast pain breast mass or nipple discharge bilaterally.  The patient is a 73 year old female.   Past Surgical History ([name]) Breast Biopsy Right. Hip Surgery Left. Knee Surgery Bilateral. Shoulder Surgery Left.  Diagnostic Studies History ([name], RMA Colonoscopy 5-10 years ago Mammogram within last year Pap Smear >5 years ago  Allergies ([name], RMA Morphine Sulfate *ANALGESICS - OPIOID* Itching. Allergies Reconciled  Medication History ([name], [place];  Atorvastatin Calcium ('40MG'$  Tablet, Oral) Active. hydroCHLOROthiazide (12.'5MG'$  Tablet, Oral) Active. Propranolol HCl ('40MG'$  Tablet, Oral) Active. Albuterol (90MCG/ACT Aerosol Soln, Inhalation) Active. Mirtazapine (7.'5MG'$  Tablet, Oral) Active. Primidone ('50MG'$  Tablet, Oral) Active. Aspirin ('325MG'$  Tablet, Oral) Active. Medications Reconciled  Social History ([name], [place];  Alcohol use Remotely quit alcohol use. Caffeine use Carbonated beverages, Coffee, Tea. No drug use Tobacco use Current every day smoker.  Family History ([name], [place]; Arthritis Mother, Sister. Cancer Father. Cerebrovascular Accident Mother. Heart Disease Mother. Heart disease in female family member before age 49 Hypertension Mother. Kidney Disease Mother. Thyroid problems Sister.  Pregnancy / Birth History  ([name], [place];  Age at menarche 69 years. Age of menopause >46 Gravida 1 Maternal age 24-20 Para 1 Regular periods  Other Problems ([name], [place]; Breast Cancer High blood pressure Hypercholesterolemia Sickle cell disease     Review of Systems ([name] RMA;  General Not Present- Appetite Loss, Chills, Fatigue, Fever, Night Sweats, Weight Gain and Weight Loss. Skin Not Present- Change in Wart/Mole, Dryness, Hives, Jaundice, New Lesions, Non-Healing Wounds, Rash and Ulcer. HEENT Present- Wears glasses/contact lenses. Not Present- Earache, Hearing Loss, Hoarseness, Nose Bleed, Oral Ulcers, Ringing in the Ears, Seasonal Allergies, Sinus Pain, Sore Throat, Visual Disturbances and Yellow Eyes. Respiratory Not Present- Bloody sputum, Chronic Cough, Difficulty Breathing, Snoring and Wheezing. Breast Present- Breast Mass. Not Present- Breast Pain, Nipple Discharge and Skin Changes. Cardiovascular Present- Leg Cramps and Swelling of Extremities. Not Present- Chest Pain, Difficulty Breathing Lying Down, Palpitations, Rapid Heart Rate and Shortness of Breath. Gastrointestinal Not Present- Abdominal Pain, Bloating, Bloody Stool, Change in Bowel Habits, Chronic diarrhea, Constipation, Difficulty Swallowing, Excessive gas, Gets full quickly at meals, Hemorrhoids, Indigestion, Nausea, Rectal Pain and Vomiting. Female Genitourinary Not Present- Frequency, Nocturia, Painful Urination, Pelvic Pain and Urgency. Musculoskeletal Not Present- Back Pain, Joint Pain, Joint Stiffness, Muscle Pain, Muscle Weakness and Swelling of Extremities. Neurological Present- Tremor. Not Present- Decreased Memory, Fainting, Headaches, Numbness, Seizures, Tingling, Trouble walking and Weakness. Psychiatric Not Present- Anxiety, Bipolar, Change in Sleep Pattern, Depression, Fearful and Frequent crying. Endocrine Not Present- Cold Intolerance, Excessive Hunger, Hair Changes, Heat  Intolerance, Hot flashes and New Diabetes. Hematology Not Present- Blood Thinners, Easy Bruising, Excessive bleeding, Gland problems, HIV and Persistent Infections.  Vitals ([name] RMA;  03/25/2018 2:13 PM Weight: 172.8 lb Height: 69.5in Body Surface Area: 1.95 m Body Mass Index: 25.15 kg/m  Temp.: 98.40F  Pulse: 66 (Regular)  BP: 128/82 (Sitting, Left Arm, Standard)  Physical Exam (Kerra Guilfoil A. Navon Kotowski MD General Mental Status-Alert. General Appearance-Consistent with stated age. Hydration-Well hydrated. Voice-Normal.  Head and Neck Head-normocephalic, atraumatic with no lesions or palpable masses. Trachea-midline. Thyroid Gland Characteristics - normal size and consistency.  Chest and Lung Exam Chest and lung exam reveals -quiet, even and easy respiratory effort with no use of accessory muscles and on auscultation, normal breath sounds, no adventitious sounds and normal vocal resonance. Inspection Chest Wall - Normal. Back - normal.  Breast Breast - Left-Symmetric, Non Tender, No Biopsy scars, no Dimpling, No Inflammation, No Lumpectomy scars, No Mastectomy scars, No Peau d' Orange. Breast - Right-Symmetric, Non Tender, No Biopsy scars, no Dimpling, No Inflammation, No Lumpectomy scars, No Mastectomy scars, No Peau d' Orange. Breast Lump-No Palpable Breast Mass. Note: Right breast biopsy site noted  Neurologic Neurologic evaluation reveals -alert and oriented x 3 with no impairment of recent or remote memory. Mental Status-Normal.  Musculoskeletal Normal Exam - Left-Upper Extremity Strength Normal and Lower Extremity Strength Normal. Normal Exam - Right-Upper Extremity Strength Normal and Lower Extremity Strength Normal.  Lymphatic Head & Neck  General Head & Neck Lymphatics: Bilateral - Description - Normal. Axillary  General Axillary Region: Bilateral - Description - Normal. Tenderness - Non  Tender.    Assessment & Plan (Aldine Grainger A. Dorien Mayotte MD;  BREAST CANCER, RIGHT (C50.911) Impression: Patient will need MRI.  The patient appears to be a good breast conserving candidate. We will plan on right breast lumpectomy with sentinel lymph node mapping. She will be referred to medical and radiation oncology for opinions. Risk of lumpectomy include bleeding, infection, seroma, more surgery, use of seed/wire, wound care, cosmetic deformity and the need for other treatments, death , blood clots, death. Pt agrees to proceed. Risk of sentinel lymph node mapping include bleeding, infection, lymphedema, shoulder pain. stiffness, dye allergy. cosmetic deformity , blood clots, death, need for more surgery. Pt agres to proceed.  Current Plans You are being scheduled for surgery- Our schedulers will call you.  You should hear from our office's scheduling department within 5 working days about the location, date, and time of surgery. We try to make accommodations for patient's preferences in scheduling surgery, but sometimes the OR schedule or the surgeon's schedule prevents [place] from making those accommodations.  If you have not heard from our office [phone]) in 5 working days, call the office and ask for your surgeon's nurse.  If you have other questions about your diagnosis, plan, or surgery, call the office and ask for your surgeon's nurse.  Pt Education - [facility] Breast Cancer Information Given - [facility] "Breast Journey" Package We discussed the staging and pathophysiology of breast cancer. We discussed all of the different options for treatment for breast cancer including surgery, chemotherapy, radiation therapy, Herceptin, and antiestrogen therapy. We discussed a sentinel lymph node biopsy as she does not appear to having lymph node involvement right now. We discussed the performance of that with injection of radioactive tracer and blue dye. We discussed that she would have an  incision underneath her axillary hairline. We discussed that there is a bout a 10-20% chance of having a positive node with a sentinel lymph node biopsy and we will await the permanent pathology to make any other first further decisions in terms of her treatment. One of these options might be to return to the operating room to perform an axillary lymph node dissection. We discussed about a 1-2% risk lifetime of chronic shoulder pain as well as lymphedema associated with  a sentinel lymph node biopsy. We discussed the options for treatment of the breast cancer which included lumpectomy versus a mastectomy. We discussed the performance of the lumpectomy with a wire placement. We discussed a 10-20% chance of a positive margin requiring reexcision in the operating room. We also discussed that she may need radiation therapy or antiestrogen therapy or both if she undergoes lumpectomy. We discussed the mastectomy and the postoperative care for that as well. We discussed that there is no difference in her survival whether she undergoes lumpectomy with radiation therapy or antiestrogen therapy versus a mastectomy. There is a slight difference in the local recurrence rate being 3-5% with lumpectomy and about 1% with a mastectomy. We discussed the risks of operation including bleeding, infection, possible reoperation. She understands her further therapy will be based on what her stages at the time of her operation.  Pt Education - [facility] breast cancer surgery: discussed with patient and provided information. Pt Education - [facility] Breast Biopsy HCI: discussed with patient and provided information. Pt Education - ABC (After Breast Cancer) Class Info: discussed with patient and provided information.

## 2018-05-07 NOTE — Interval H&P Note (Signed)
History and Physical Interval Note:  05/07/2018 9:25 AM  Catherine Lowery  has presented today for surgery, with the diagnosis of RIGHT BREAST CANCER  The various methods of treatment have been discussed with the patient and family. After consideration of risks, benefits and other options for treatment, the patient has consented to  Procedure(s): RIGHT BREAST LUMPECTOMY WITH RADIOACTIVE SEED AND RIGHT  SENTINEL LYMPH NODE MAPPING (Right) as a surgical intervention .  The patient's history has been reviewed, patient examined, no change in status, stable for surgery.  I have reviewed the patient's chart and labs.  Questions were answered to the patient's satisfaction.     [place]

## 2018-05-07 NOTE — Anesthesia Procedure Notes (Signed)
Procedure Name: LMA Insertion Date/Time: 05/07/2018 10:40 AM Performed by: Maryella Shivers, CRNA Pre-anesthesia Checklist: Patient identified, Emergency Drugs available, Suction available and Patient being monitored Patient Re-evaluated:Patient Re-evaluated prior to induction Oxygen Delivery Method: Circle system utilized Preoxygenation: Pre-oxygenation with 100% oxygen Induction Type: IV induction Ventilation: Mask ventilation without difficulty LMA: LMA inserted LMA Size: 4.0 Number of attempts: 1 Airway Equipment and Method: Bite block Placement Confirmation: positive ETCO2 Tube secured with: Tape Dental Injury: Teeth and Oropharynx as per pre-operative assessment

## 2018-05-07 NOTE — Op Note (Signed)
Preoperative diagnosis: Stage I right lower inner quadrant breast cancer     Postoperative diagnosis: Same   Procedure: Right breast seed localized lumpectomy with right axillary sentinel lymph node mapping deep  Surgeon: [name] M.D   Anesthesia: LMA with pectoral block anesthesia and local anesthetic   EBL: 20 cc    Specimen: Right breast mass with clipped and seed to pathology and two axillary sentinel node hot   Drains: None   Indications for procedure: Patient presents for treatment of her l right breast cancer. She has opted for breast conservation after lengthy discussion of treatment options to include breast conservation surgery and mastectomy and reconstruction. Risks, benefits and alternatives discussed with the patient.The procedure has been discussed with the patient. Alternatives to surgery have been discussed with the patient. Risks of surgery include bleeding, Infection, Seroma formation, death, and the need for further surgery. The patient understands and wishes to proceed.Sentinel lymph node mapping and dissection has been discussed with the patient. Risk of bleeding, Infection, Seroma formation, Additional procedures,, Shoulder weakness , Shoulder stiffness, Nerve and blood vessel injury and reaction to the mapping dyes have been discussed. Alternatives to surgery have been discussed with the patient. The patient agrees to proceed.   Description of procedure: Patient underwent placement of right breast seed in radiology earlier in the week. She presents to the holding area and questions are answered. Neoprobe was used to verify seed placement in the right breast. Patient underwent technetium sulfur colloid injection per protocol. Questions answered. Patient taken back to operating room and placed supine on the operating room table. Patient received 2 g of Ancef. After induction of LMA anesthesia right breast was prepped and draped in a sterile. Of note, patient had  pectoral block by anesthesia prior to this. Neoprobe was used to identify the radioactive seen in the right lower inner quadrant. Curvilinear incision made and dissection was carried around to excise all tissue around both the clip and seed. Radiograph showed the mass with gross negative margins. Both seed and clip were in the specimen. Specimen sent to pathology.   Neoprobe was switched to the technetium sulfur colloid setting. Hot spot identify the right axilla. Incision made in the inferior axillary hairline and dissection carried into the axilla.  2 Hot lymph nodes were identified and excised. Background counts approached 0.  Wound was irrigated and closed with 3-0 Vicryl and 4-0 Monocryl. Lumpectomy site closed in a similar fashion. Dermabond applied. All final counts sponge, needle instruments found to be correct at this point. Patient awoke, taken to recovery in satisfactory condition.

## 2018-05-07 NOTE — Anesthesia Preprocedure Evaluation (Addendum)
Anesthesia Evaluation  Patient identified by MRN, date of birth, ID band Patient awake    Reviewed: Allergy & Precautions, NPO status , Patient's Chart, lab work & pertinent test results, reviewed documented beta blocker date and time   History of Anesthesia Complications (+) PONV and history of anesthetic complications  Airway Mallampati: II  TM Distance: >3 FB Neck ROM: Full    Dental no notable dental hx. (+) Dental Advisory Given   Pulmonary shortness of breath and with exertion, Current Smoker,    Pulmonary exam normal breath sounds clear to auscultation       Cardiovascular hypertension, Pt. on medications and Pt. on home beta blockers negative cardio ROS Normal cardiovascular exam Rhythm:Regular Rate:Normal  TTE 2016 EF 55-60%, no valvular abnormalities  Stress Test 2016 1. No reversible ischemia or infarction. 2. Normal left ventricular wall motion. 3. Left ventricular ejection fraction 47% 4. Low-risk stress test findings*.   Neuro/Psych negative neurological ROS  negative psych ROS   GI/Hepatic negative GI ROS, Neg liver ROS,   Endo/Other  Hyperthyroidism   Renal/GU Renal InsufficiencyRenal disease  negative genitourinary   Musculoskeletal  (+) Arthritis , Osteoarthritis,    Abdominal   Peds  Hematology negative hematology ROS (+)   Anesthesia Other Findings Right breast cancer  Reproductive/Obstetrics                            Anesthesia Physical Anesthesia Plan  ASA: III  Anesthesia Plan: General and Regional   Post-op Pain Management:  Regional for Post-op pain   Induction: Intravenous  PONV Risk Score and Plan: 3 and Ondansetron, Dexamethasone and Midazolam  Airway Management Planned: LMA  Additional Equipment:   Intra-op Plan:   Post-operative Plan: Extubation in OR  Informed Consent: I have reviewed the patients History and Physical, chart, labs and  discussed the procedure including the risks, benefits and alternatives for the proposed anesthesia with the patient or authorized representative who has indicated his/her understanding and acceptance.   Dental advisory given  Plan Discussed with: CRNA  Anesthesia Plan Comments:         Anesthesia Quick Evaluation

## 2018-05-07 NOTE — Transfer of Care (Signed)
Immediate Anesthesia Transfer of Care Note  Patient: Catherine Lowery  Procedure(s) Performed: RIGHT BREAST LUMPECTOMY WITH RADIOACTIVE SEED AND RIGHT  SENTINEL LYMPH NODE MAPPING (Right Breast)  Patient Location: PACU  Anesthesia Type:GA combined with regional for post-op pain  Level of Consciousness: awake, alert  and oriented  Airway & Oxygen Therapy: Patient Spontanous Breathing and Patient connected to face mask oxygen  Post-op Assessment: Report given to RN and Post -op Vital signs reviewed and stable  Post vital signs: Reviewed and stable  Last Vitals:  Vitals Value Taken Time  BP 113/60 05/07/2018 11:50 AM  Temp    Pulse 62 05/07/2018 11:52 AM  Resp 11 05/07/2018 11:52 AM  SpO2 100 % 05/07/2018 11:52 AM  Vitals shown include unvalidated device data.  Last Pain:  Vitals:   05/07/18 0943  TempSrc: Oral  PainSc: 0-No pain         Complications: No apparent anesthesia complications

## 2018-05-08 ENCOUNTER — Encounter (HOSPITAL_BASED_OUTPATIENT_CLINIC_OR_DEPARTMENT_OTHER): Payer: Self-pay | Admitting: Surgery

## 2018-05-08 NOTE — Anesthesia Postprocedure Evaluation (Signed)
Anesthesia Post Note  Patient: Catherine Lowery  Procedure(s) Performed: RIGHT BREAST LUMPECTOMY WITH RADIOACTIVE SEED AND RIGHT  SENTINEL LYMPH NODE MAPPING (Right Breast)     Patient location during evaluation: PACU Anesthesia Type: Regional and General Level of consciousness: awake and alert Pain management: pain level controlled Vital Signs Assessment: post-procedure vital signs reviewed and stable Respiratory status: spontaneous breathing, nonlabored ventilation, respiratory function stable and patient connected to nasal cannula oxygen Cardiovascular status: blood pressure returned to baseline and stable Postop Assessment: no apparent nausea or vomiting Anesthetic complications: no    Last Vitals:  Vitals:   05/07/18 1215 05/07/18 1230  BP: 109/66 (!) 112/59  Pulse: (!) 55 61  Resp: 12 16  Temp:  36.7 C  SpO2: 100% 95%    Last Pain:  Vitals:   05/07/18 1230  TempSrc:   PainSc: 0-No pain                 Chelsey L Woodrum

## 2018-05-09 ENCOUNTER — Inpatient Hospital Stay: Payer: Medicare HMO | Attending: Oncology | Admitting: Genetics

## 2018-05-09 ENCOUNTER — Inpatient Hospital Stay: Payer: Medicare HMO

## 2018-05-09 DIAGNOSIS — Z807 Family history of other malignant neoplasms of lymphoid, hematopoietic and related tissues: Secondary | ICD-10-CM

## 2018-05-09 DIAGNOSIS — Z803 Family history of malignant neoplasm of breast: Secondary | ICD-10-CM

## 2018-05-09 DIAGNOSIS — C50311 Malignant neoplasm of lower-inner quadrant of right female breast: Secondary | ICD-10-CM | POA: Diagnosis not present

## 2018-05-09 DIAGNOSIS — Z809 Family history of malignant neoplasm, unspecified: Secondary | ICD-10-CM

## 2018-05-09 DIAGNOSIS — Z808 Family history of malignant neoplasm of other organs or systems: Secondary | ICD-10-CM

## 2018-05-09 DIAGNOSIS — Z170 Estrogen receptor positive status [ER+]: Secondary | ICD-10-CM | POA: Diagnosis not present

## 2018-05-09 DIAGNOSIS — Z315 Encounter for genetic counseling: Secondary | ICD-10-CM

## 2018-05-09 DIAGNOSIS — Z8041 Family history of malignant neoplasm of ovary: Secondary | ICD-10-CM

## 2018-05-09 DIAGNOSIS — Z8042 Family history of malignant neoplasm of prostate: Secondary | ICD-10-CM

## 2018-05-09 DIAGNOSIS — Z720 Tobacco use: Secondary | ICD-10-CM | POA: Insufficient documentation

## 2018-05-10 ENCOUNTER — Encounter: Payer: Self-pay | Admitting: Genetics

## 2018-05-10 ENCOUNTER — Other Ambulatory Visit: Payer: Self-pay | Admitting: Oncology

## 2018-05-10 DIAGNOSIS — Z8041 Family history of malignant neoplasm of ovary: Secondary | ICD-10-CM | POA: Insufficient documentation

## 2018-05-10 DIAGNOSIS — Z8042 Family history of malignant neoplasm of prostate: Secondary | ICD-10-CM | POA: Insufficient documentation

## 2018-05-10 DIAGNOSIS — Z803 Family history of malignant neoplasm of breast: Secondary | ICD-10-CM | POA: Insufficient documentation

## 2018-05-10 NOTE — Progress Notes (Signed)
REFERRING PROVIDER: Chauncey Cruel, MD [place], [place] [address]  PRIMARY PROVIDER:  Alycia Rossetti, MD  PRIMARY REASON FOR VISIT:  1. Malignant neoplasm of lower-inner quadrant of right breast of female, estrogen receptor positive ([place])   2. Family history of breast cancer   3. Family history of prostate cancer   4. Family history of ovarian cancer     HISTORY OF PRESENT ILLNESS:   Catherine Lowery, a 73 y.o. female, was seen for a [facility] cancer genetics consultation at the request of Dr. Jana Hakim due to a personal and family history of cancer.  Catherine Lowery presents to clinic today to discuss the possibility of a hereditary predisposition to cancer, genetic testing, and to further clarify her future cancer risks, as well as potential cancer risks for family members.   In Dec 2019, at the age of 85, Catherine Lowery was diagnosed with invasive  Mammary carcinoma of the right breast, ER/PR+, HER2 neg.  She had a lumpectomy on 05/07/2018, and plans adjuvant radiation as appropriate and antiestrogen therapy.   HORMONAL RISK FACTORS:  Menarche was at age 73.  First live birth at age 77.  Ovaries intact: yes.  Hysterectomy: no.  Menopausal status: postmenopausal.  HRT use: 0 years. Colonoscopy: yes; 2016, normal.  Past Medical History:  Diagnosis Date  . Cancer ([place]) 03/2018   right breast cancer  . Chronic kidney disease    CKD  . DJD (degenerative joint disease)    LEFT HIP  . Family history of breast cancer   . Family history of ovarian cancer   . Family history of prostate cancer   . Family history of prostate cancer   . Hyperlipidemia    takes Simvasatin daily  . Hypertension    takes Propranolol and HCTZ daioly  . Hyperthyroidism   . Pneumonia 10/2015  . PONV (postoperative nausea and vomiting)    when ether used  . Shortness of breath dyspnea    with ambulation  . Tremors of nervous system    takes Primidone daily    Past Surgical  History:  Procedure Laterality Date  . BOne Spur     left heel  . BREAST LUMPECTOMY WITH RADIOACTIVE SEED AND SENTINEL LYMPH NODE BIOPSY Right 05/07/2018   Procedure: RIGHT BREAST LUMPECTOMY WITH RADIOACTIVE SEED AND RIGHT  SENTINEL LYMPH NODE MAPPING;  Surgeon: [name], MD;  Location: [facility];  Service: General;  Laterality: Right;  . COLONOSCOPY    . JOINT REPLACEMENT Bilateral    knee  . KNEE SURGERY    . ROTATOR CUFF REPAIR Bilateral   . 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 TOTAL HIP ARTHROPLASTY Left 12/29/2015   Procedure: TOTAL HIP ARTHROPLASTY ANTERIOR APPROACH;  Surgeon: Ninetta Lights, MD;  Location: [facility];  Service: Orthopedics;  Laterality: Left;    Social History   Socioeconomic History  . Marital status: Married    Spouse name: Not on file  . Number of children: 1  . Years of education: 42  . Highest education level: Not on file  Occupational History    Comment: retired  Scientific laboratory technician  . Financial resource strain: Not on file  . Food insecurity:    Worry: Not on file    Inability: Not on file  . Transportation needs:    Medical: Not on file    Non-medical: Not on file  Tobacco Use  . Smoking status: Current Every Day Smoker    Packs/day:  0.50    Years: 20.00    Pack years: 10.00  . Smokeless tobacco: Never Used  . Tobacco comment: 06/06/16 Pt stated "I'm trying to quit"  Substance and Sexual Activity  . Alcohol use: No    Alcohol/week: 0.0 standard drinks  . Drug use: No  . Sexual activity: Not Currently    Birth control/protection: Post-menopausal  Lifestyle  . Physical activity:    Days per week: Not on file    Minutes per session: Not on file  . Stress: Not on file  Relationships  . Social connections:    Talks on phone: Not on file    Gets together: Not on file    Attends religious service: Not on file    Active member of club or organization: Not on file    Attends meetings of clubs or organizations: Not on file     Relationship status: Not on file  Other Topics Concern  . Not on file  Social History Narrative   Lives with handicapped son   Caffeine- coffee, 2 cups daily, 1-2 sodas daily     FAMILY HISTORY:  We obtained a detailed, 4-generation family history.  Significant diagnoses are listed below: Family History  Problem Relation Age of Onset  . Hypertension Mother   . Heart disease Mother   . Stroke Mother   . Multiple myeloma Father 12  . Thyroid cancer Sister        dx 40's/50's  . Cancer Paternal Aunt        unknown  . Prostate cancer Paternal Uncle        dx >50- cancer was the cause of his death  . Ovarian cancer Paternal Aunt   . Throat cancer Paternal Uncle   . Breast cancer Cousin        dx >50  . Cancer Cousin        type unk  . Cancer Cousin        type unk  . Cancer Cousin        type unk    Catherine Lowery has a son, Catherine Lowery who is 64 with no hx of cancer. She reports he has sickle cell disease.  She has 1 grandson who is 68.   Catherine Lowery. Mustin has 2 sisters, Catherine Lowery who had thyroid cancer in her 39's/50's and Catherine Lowery who has no hx of cancer.  She also has a brother who is deceased.   Catherine Lowery's father: died at 68 with multiple myeloma.  Paternal Aunts/Uncles: 18 paternal aunts/uncles: -1 paternal uncle died of prostate cancer dx over the age of 56 -1 paternal uncle had throat cancer -1 paternal aunt died of ovarian cancer -1 paternal aunt had cancer-type unk Paternal cousins: 3 paternal cousins with cancer-3 the patient does not know the type of- but the 4th cousin, Catherine Lowery, had breast cancer dx a little over the age of 23.   Paternal grandfather: no hx of cancer Paternal grandmother:no hx of cancer  Catherine Lowery's mother: died at 21 due to stroke.  She as an only cild.  Maternal grandfather: no hx of cancer Maternal grandmother:died of stroke  Catherine Lowery is unaware of previous family history of genetic testing for hereditary cancer risks. Patient's maternal  ancestors are of African American descent, and paternal ancestors are of African American/Native American descent. There is no reported Ashkenazi Jewish ancestry. There is no known consanguinity.  GENETIC COUNSELING ASSESSMENT: Catherine Lowery is a 73 y.o. female with a personal and family  history which is somewhat suggestive of a Hereditary Cancer Predisposition Syndrome. We, therefore, discussed and recommended the following at today's visit.   DISCUSSION: We reviewed the characteristics, features and inheritance patterns of hereditary cancer syndromes. We also discussed genetic testing, including the appropriate family members to test, the process of testing, insurance coverage and turn-around-time for results. We discussed the implications of a negative, positive and/or variant of uncertain significant result. We recommended Catherine Lowery pursue genetic testing for the Common Hereditary Cancers gene panel + Thyroid Cancer Panel.  The Common Hereditary Cancer Panel offered by [facility] includes sequencing and/or deletion duplication testing of the following 53 genes: APC, [name], [name], [name], [name], [name], [name], [name], [name], [name], [name], CDKN2A, [name], [name], [name], [name], EPCAM, [name], GREM1, [name], KIT, MEN1, [name], [name], [name], [name], [name], [name], [name], [name], [name], [name], [name], [name], [name], POLE, [name], RAD50, [name], [name], [name], [name], SDHA, [name], [name], [name], [name], [name], [name], [name], [name], [name], VHL  Thyroid Cancer Panel:APC [name] [name] [name] [name] [name] [name]   We discussed that only 5-10% of cancers are associated with a Hereditary cancer predisposition syndrome.  One of the most common hereditary cancer syndromes that increases breast cancer risk is called Hereditary Breast and Ovarian Cancer (HBOC) syndrome.  This syndrome is caused by mutations in the [name] and [name] genes.  This syndrome increases an individual's lifetime risk to develop breast, ovarian, pancreatic, and other types of  cancer.  There are also many other cancer predisposition syndromes caused by mutations in several other genes.  We discussed that if she is found to have a mutation in one of these genes, it may impact future medical management recommendations such as increased cancer screenings and consideration of risk reducing surgeries.  A positive result could also have implications for the patient's family members.  A Negative result would mean we were unable to identify a hereditary component to her cancer, but does not rule out the possibility of a hereditary basis for her  cancer.  There could be mutations that are undetectable by current technology, or in genes not yet tested or identified to increase cancer risk.    We discussed the potential to find a Variant of Uncertain Significance or VUS.  These are variants that have not yet been identified as pathogenic or benign, and it is unknown if this variant is associated with increased cancer risk or if this is a normal finding.  Most VUS's are reclassified to benign or likely benign.   It should not be used to make medical management decisions. With time, we suspect the lab will determine the significance of any VUS's identified if any.   Based on Catherine Lowery's personal and family history of cancer, she meets medical criteria for genetic testing. Despite that she meets criteria, she may still have an out of pocket cost. The laboratory can provide her with an estimate of her OOP cost.  she was given the contact information for the laboratory if she has further questions. Marland Kitchen   PLAN: After considering the risks, benefits, and limitations, Catherine Lowery  provided informed consent to pursue genetic testing and the blood sample was sent to [facility] for analysis of the Common Hereditary Cancers Panel + Thyroid Cancer Panel. Results should be available within approximately 2-3 weeks' time, at which point they will be disclosed by telephone to Catherine Lowery, as  will any additional recommendations warranted by these results. Catherine Lowery will receive a summary of her genetic counseling visit and a copy of  her results once available. This information will also be available in Epic. We encouraged Catherine Lowery to remain in contact with cancer genetics annually so that we can continuously update the family history and inform her of any changes in cancer genetics and testing that may be of benefit for her family. Catherine Lowery. Paredez's questions were answered to her satisfaction today. Our contact information was provided should additional questions or concerns arise.  Based on Catherine Lowery. Pallas's family history, we recommended her siblings and paternal relatives also have genetic counseling and testing. Catherine Lowery. Sonnenfeld will let us know if we can be of any assistance in coordinating genetic counseling and/or testing for this family member.   Lastly, we encouraged Catherine Lowery. Hefley to remain in contact with cancer genetics annually so that we can continuously update the family history and inform her of any changes in cancer genetics and testing that may be of benefit for this family.   Catherine Lowery.  Halsted's questions were answered to her satisfaction today. Our contact information was provided should additional questions or concerns arise. Thank you for the referral and allowing [place] to Lower[place] in the care of your patient.   Tana Felts, Catherine Lowery, [facility] Certified Genetic Counselor [email or web address] phone: [phone]  The patient was seen for a total of 30 minutes in face-to-face genetic counseling.  This patient was discussed with Drs. Magrinat, Lindi Adie and/or Burr Medico who agrees with the above.

## 2018-05-13 ENCOUNTER — Ambulatory Visit: Payer: Self-pay | Admitting: Surgery

## 2018-05-13 ENCOUNTER — Other Ambulatory Visit: Payer: Self-pay | Admitting: Oncology

## 2018-05-13 ENCOUNTER — Telehealth: Payer: Self-pay | Admitting: *Deleted

## 2018-05-13 NOTE — Telephone Encounter (Signed)
Received order for mammaprint testing. Requisition faxed to pathology. Received by Varney Biles.

## 2018-05-20 ENCOUNTER — Ambulatory Visit: Payer: Self-pay | Admitting: Genetics

## 2018-05-20 ENCOUNTER — Telehealth: Payer: Self-pay | Admitting: Genetics

## 2018-05-20 ENCOUNTER — Encounter: Payer: Self-pay | Admitting: Genetics

## 2018-05-20 DIAGNOSIS — Z170 Estrogen receptor positive status [ER+]: Secondary | ICD-10-CM

## 2018-05-20 DIAGNOSIS — Z1379 Encounter for other screening for genetic and chromosomal anomalies: Secondary | ICD-10-CM

## 2018-05-20 DIAGNOSIS — Z803 Family history of malignant neoplasm of breast: Secondary | ICD-10-CM

## 2018-05-20 DIAGNOSIS — Z8041 Family history of malignant neoplasm of ovary: Secondary | ICD-10-CM

## 2018-05-20 DIAGNOSIS — C50311 Malignant neoplasm of lower-inner quadrant of right female breast: Secondary | ICD-10-CM

## 2018-05-20 DIAGNOSIS — Z8042 Family history of malignant neoplasm of prostate: Secondary | ICD-10-CM

## 2018-05-20 NOTE — Progress Notes (Signed)
HPI:  Catherine Lowery was previously seen in the [facility] clinic on 05/09/2018 due to a personal and family history of cancer and concerns regarding a hereditary predisposition to cancer. Please refer to our prior cancer genetics clinic note for more information regarding Catherine Lowery's medical, social and family histories, and our assessment and recommendations, at the time. Catherine Lowery's recent genetic test results were disclosed to her, as well as recommendations warranted by these results. These results and recommendations are discussed in more detail below.  CANCER HISTORY:    Malignant neoplasm of lower-inner quadrant of right breast of female, estrogen receptor positive (Catherine Lowery)   04/11/2018 Initial Diagnosis    Malignant neoplasm of lower-inner quadrant of right breast of female, estrogen receptor positive (Catherine Lowery)    05/18/2018 Genetic Testing    The Common Hereditary Cancers Panel + Thyroid [place]er panel was ordered (55 genes): APC, [name], [name], [name], [name], [name], [name], [name], [name], [name], [name], [name], [name], [name], [name], [name], [name], [name], [name], [name], [name], KIT, MEN1, [name], [name], [name], [name], [name], [name], [name], [name], [name], [name], [name], [name], [name], POLE, [name], [name], RAD50, [name], [name], [name], [name], [name], [name], [name], [name], [name], [name], [name], [name], [name], [name], [name], VHL.  Results: No pathogenic variants identified.  2 variants of uncertain significance in the genes [name] c.1176C>T (Silent) and [name] c.1419+4C>T (Intronic) were identified.  The date of this test report is 05/18/2018.        FAMILY HISTORY:  We obtained a detailed, 4-generation family history.  Significant diagnoses are listed below: Family History  Problem Relation Age of Onset  . Hypertension Mother   . Heart disease Mother   . Stroke Mother   . Multiple myeloma Father 40  . Thyroid cancer Sister        dx 40's/50's  . Cancer Paternal Aunt        unknown  .  Prostate cancer Paternal Uncle        dx >50- cancer was the cause of his death  . Ovarian cancer Paternal Aunt   . Throat cancer Paternal Uncle   . Breast cancer Cousin        dx >50  . Cancer Cousin        type unk  . Cancer Cousin        type unk  . Cancer Cousin        type unk    Catherine Lowery has a son, Catherine Lowery who is 59 with no hx of cancer. She reports he has sickle cell disease.  She has 1 grandson who is 67.   Catherine Lowery has 2 sisters, Catherine Lowery who had thyroid cancer in her 27's/50's and Catherine Lowery who has no hx of cancer.  She also has a brother who is deceased.   Catherine Lowery's father: died at 27 with multiple myeloma.  Paternal Aunts/Uncles: 72 paternal aunts/uncles: -1 paternal uncle died of prostate cancer dx over the age of 58 -1 paternal uncle had throat cancer -1 paternal aunt died of ovarian cancer -1 paternal aunt had cancer-type unk Paternal cousins: 3 paternal cousins with cancer-3 the patient does not know the type of- but the 4th cousin, Catherine Lowery, had breast cancer dx a little over the age of 67.   Paternal grandfather: no hx of cancer Paternal grandmother:no hx of cancer  Catherine Lowery's mother: died at 44 due to stroke.  She as an only cild.  Maternal grandfather: no hx of cancer Maternal grandmother:died of stroke  Catherine Lowery.  Lowery is unaware of previous family history of genetic testing for hereditary cancer risks. Patient's maternal ancestors are of African American descent, and paternal ancestors are of African American/Native American descent. There is no reported Ashkenazi Jewish ancestry. There is no known consanguinity.  GENETIC TEST RESULTS: Genetic testing performed through [facility]'s Common Hereditary Cancers Panel + Thyroid Cancer Panel reported out on 05/18/2018 showed no pathogenic mutations.   Genes analyzed: APC, [name], [name], [name], [name], [name], [name], [name], [name], [name], [name], [name], [name], [name], [name], [name], [name],  [name], [name], [name], [name], KIT, MEN1, [name], [name], [name], [name], [name], [name], [name], [name], [name], [name], [name], [name], [name], POLE, [name], [name], RAD50, [name], [name], [name], [name], [name], [name], [name], [name], [name], [name], [name], [name], [name], [name], [name], VHL.  A variant of uncertain significance (VUS) in a gene called [name] was also noted. c.1176C>T (Silent) A variant of uncertain significance (VUS) in a gene called [name] was also noted. c.1419+4C>T (Intronic)  The test report will be scanned into EPIC and will be located under the Molecular Pathology section of the Results Review tab. A portion of the result report is included below for reference.     We discussed with Catherine Lowery that because current genetic testing is not perfect, it is possible there may be a gene mutation in one of these genes that current testing cannot detect, but that chance is small.  We also discussed, that there could be another gene that has not yet been discovered, or that we have not yet tested, that is responsible for the cancer diagnoses in the family. It is also possible there is a hereditary cause for the cancer in the family that Catherine Lowery did not inherit and therefore was not identified in her testing.  Therefore, it is important to remain in touch with cancer genetics in the future so that we can continue to offer Catherine Lowery the most up to date genetic testing.   Regarding the VUS's in [name] and [name]: At this time, it is unknown if these variants are associated with increased cancer risk or if they are normal findings, but most variants such as these get reclassified to being inconsequential. They should not be used to make medical management decisions. With time, we suspect the lab will determine the significance of these variants, if any. If we do learn more about them, we will try to contact Catherine Lowery to discuss it further. However, it is important to stay in touch with [place] periodically and keep the  address and phone number up to date.  ADDITIONAL GENETIC TESTING: We discussed with Catherine Lowery that her genetic testing was fairly extensive.  If there are are genes identified to increase cancer risk that can be analyzed in the future, we would be happy to discuss and coordinate this testing at that time.    CANCER SCREENING RECOMMENDATIONS: Catherine Lowery. Gagliardo test result is considered negative (normal).  This means that we have not identified a hereditary cause for her personal and family history of cancer at this time.   While reassuring, this does not definitively rule out a hereditary predisposition to cancer. It is still possible that there could be genetic mutations that are undetectable by current technology, or genetic mutations in genes that have not been tested or identified to increase cancer risk.  Therefore, it is recommended she continue to follow the cancer management and screening guidelines provided by her oncology and primary healthcare provider. An individual's cancer risk is not determined by genetic  test results alone.  Overall cancer risk assessment includes additional factors such as personal medical history, family history, etc.  These should be used to make a personalized plan for cancer prevention and surveillance.    RECOMMENDATIONS FOR FAMILY MEMBERS:  Relatives in this family might be at some increased risk of developing cancer, over the general population risk, simply due to the family history of cancer.  We recommended women in this family have a yearly mammogram beginning at age 68, or 81 years younger than the earliest onset of cancer, an annual clinical breast exam, and perform monthly breast self-exams. Women in this family should also have a gynecological exam as recommended by their primary provider. All family members should have a colonoscopy by age 106 (or as directed by their doctors).  All family members should inform their physicians about the family history of  cancer so their doctors can make the most appropriate screening recommendations for them.   It is also possible there is a hereditary cause for the cancer in Catherine Lowery. Lasalle's family that she did not inherit and therefore was not identified in her.   Therefore, we recommended maternal relatives also have genetic counseling and testing. Catherine Lowery. Helbling will let us know if we can be of any assistance in coordinating genetic counseling and/or testing for these family members.   FOLLOW-UP: Lastly, we discussed with Catherine Lowery. Heinzelman that cancer genetics is a rapidly advancing field and it is possible that new genetic tests will be appropriate for her and/or her family members in the future. We encouraged her to remain in contact with cancer genetics on an annual basis so we can update her personal and family histories and let her know of advances in cancer genetics that may benefit this family.   Our contact number was provided. Catherine Lowery. Seelman's questions were answered to her satisfaction, and she knows she is welcome to call us at anytime with additional questions or concerns.   Ferol Luz, Catherine Lowery, [facility] Certified Genetic Counselor [email or web address]

## 2018-05-20 NOTE — Telephone Encounter (Signed)
Revealed negative genetic testing.  Revealed that VUS's in [name] and [name] were identified.   This normal result is reassuring and indicates that it is unlikely Catherine Lowery's cancer is due to a hereditary cause.  It is unlikely that there is an increased risk of another cancer due to a mutation in one of these genes.  However, genetic testing is not perfect, and cannot definitively rule out a hereditary cause.  It will be important for her to keep in contact with genetics to learn if any additional testing may be needed in the future.     There could still be a genetic cause for Catherine Lowery's maternal relatives' cancer that she did not inherit and therefore was not found in [place].  We recommended they consider pursuing testing for themselves to help determine that.

## 2018-05-22 ENCOUNTER — Ambulatory Visit: Payer: Self-pay | Admitting: Surgery

## 2018-05-22 NOTE — Progress Notes (Signed)
Catherine Lowery  [phone] [phone]     ID: Catherine Lowery DOB: 1945/11/26  MR#: [id number]  QMV#:[id number]  Patient Care Team: Alycia Rossetti, MD as PCP - General (Family Medicine) Debara Pickett [name], MD as PCP - Cardiology (Cardiology) Dann Ventress, Virgie Dad, MD as Consulting Physician (Oncology) Gery Pray, MD as Consulting Physician (Radiation Oncology) Renette Butters, MD as Attending Physician (Orthopedic Surgery) [name], MD as Consulting Physician (General Surgery) Milus Banister, MD as Attending Physician (Gastroenterology) OTHER MD:   CHIEF COMPLAINT: 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 She underwent unilateral right diagnostic mammography with tomography and right breast ultrasonography at [facility] on 03/07/2018 showing: Breast Density Category B. On mammography, there is a new 1.5 cm irregular architectural distortion with a spiculated margin in the right breast at 5 o'clock anterior depth. On ultrasound, there is a 2.7 cm irregular solid mass in the right breast at 5 o'clock middle depth. This irregular mass displays posterior acoustic shadowing. No significant abnormalities were seen sonographically in the right axilla.  Accordingly on 03/18/2018 she proceeded to biopsy of the right breast area in question. The pathology from this procedure showed ([id number]): Invasive mammary carcinoma e-cadherin negative, grade II-III. 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 Prognostic indicators significant for: estrogen receptor, 95% positive and progesterone receptor, 95% positive, both with strong staining intensity. Proliferation marker Ki67 at 5%. HER2 negative by  immunohistochemistry (1+).  On 04/10/2018 the patient underwent bilateral breast MRI with and without contrast showing a 2.3 cm spiculated enhancing mass associated with non-masslike enhancement, the total abnormal area measuring 4.6 cm.  There was no evidence of lymph node abnormality and the contralateral breast was benign appearing  The patient's subsequent history is as detailed below.   INTERVAL HISTORY: Catherine Lowery returns today for follow-up and treatment of her 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 She is accompanied by her sister.  Since her last visit here, she underwent a right breast lumpectomy on 05/07/2018. The pathology from this procedure showed ([id number]):  1. Breast, lumpectomy, Right w/seed - Invasive lobular carcinoma, grade 2, 2.8 cm - Lobular carcinoma in situ - carcinoma involves the inferior margin at multiple foci and is less than 1 mm from lateral, medial, posterior, and inferior margins at multiple other foci.  - Lymphovascular or perineural invasion is not identified 2. Lymph node, sentinel, biopsy, right axillary - metastatic carcinoma to a lymph node (1/1) 3. Lymph nodes, sentinel, biopsy, right - Isolated tumor cells present (0/1)  4. Lymph node, sentinel, biopsy, right - Lymph node, negative for carcinoma (0/1)  MammaPrint results on this material is pending.  She is scheduled for re-excision of the positive margin on 06/05/2018 with [name], MD.   She also underwent genetic testing performed through [facility] Common Hereditary Cancers Panel + Thyroid Cancer Panel on 05/09/2018 showing no deleterious mutations APC, [name], [name], [name], [name], [name], [name], [name], [name], [name], [name], [name], [name], [name], [name], [name], [name], [name], [name], [name], [name], KIT, MEN1, [name], [name], [name], [name], [name], [name], [name], [name], [name], [name], [name], [name], [name], POLE, [name], [name], RAD50, [name], [name], [name], [name], [name], [name],  [name], [name], [name], [name], [name], [name], [name], [name], [name], VHL. Two variants of uncertain significance in the genes [name]  c.1176C>T (Silent) and [name] c.1419+4C>T (Intronic) were identified.   REVIEW OF SYSTEMS: Catherine Lowery thinks that her surgery went well. She had no bleeding  And only had to take ibuprofen for the pain. She has not been very active recently, and she has not been able to work since her surgery. She is still completing some housework that she can do around the house. The patient denies unusual headaches, visual changes, nausea, vomiting, or dizziness. There has been no unusual cough, phlegm production, or pleurisy. This been no change in bowel or bladder habits. The patient denies unexplained fatigue or unexplained weight loss, bleeding, rash, or fever. A detailed review of systems was otherwise noncontributory.    PAST MEDICAL HISTORY: Past Medical History:  Diagnosis Date  . Cancer ([place]) 03/2018   right breast cancer  . Chronic kidney disease    CKD  . DJD (degenerative joint disease)    LEFT HIP  . Family history of breast cancer   . Family history of ovarian cancer   . Family history of prostate cancer   . Family history of prostate cancer   . Hyperlipidemia    takes Simvasatin daily  . Hypertension    takes Propranolol and HCTZ daioly  . Hyperthyroidism   . Pneumonia 10/2015  . PONV (postoperative nausea and vomiting)    when ether used  . Shortness of breath dyspnea    with ambulation  . Tremors of nervous system    takes Primidone daily     PAST SURGICAL HISTORY: Past Surgical History:  Procedure Laterality Date  . BOne Spur     left heel  . BREAST LUMPECTOMY WITH RADIOACTIVE SEED AND SENTINEL LYMPH NODE BIOPSY Right 05/07/2018   Procedure: RIGHT BREAST LUMPECTOMY WITH RADIOACTIVE SEED AND RIGHT  SENTINEL LYMPH NODE MAPPING;  Surgeon: [name], MD;  Location: [place];  Service: General;  Laterality: Right;  . COLONOSCOPY    . JOINT  REPLACEMENT Bilateral    knee  . KNEE SURGERY    . ROTATOR CUFF REPAIR Bilateral   . 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 TOTAL HIP ARTHROPLASTY Left 12/29/2015   Procedure: TOTAL HIP ARTHROPLASTY ANTERIOR APPROACH;  Surgeon: Ninetta Lights, MD;  Location: [place];  Service: Orthopedics;  Laterality: Left;     FAMILY HISTORY Family History  Problem Relation Age of Onset  . Hypertension Mother   . Heart disease Mother   . Stroke Mother   . Multiple myeloma Father 57  . Thyroid cancer Sister        dx 40's/50's  . Cancer Paternal Aunt        unknown  . Prostate cancer Paternal Uncle        dx >50- cancer was the cause of his death  . Ovarian cancer Paternal Aunt   . Throat cancer Paternal Uncle   . Breast cancer Cousin        dx >50  . Cancer Cousin        type unk  . Cancer Cousin        type unk  . Cancer Cousin        type unk   She notes that her father died from a multiple myeloma at age 19. Patients' mother died from stroke at age 33. The patient has 1 brother and 2 sisters. One paternal aunt had ovarian cancer.  One cousin had breast cancer, and her 38s. Another paternal aunt had cancer, type unknown to the patient, and  a paternal uncle had prostate cancer.    GYNECOLOGIC HISTORY:  Menarche: 73 years old Age at first live birth: 73 years old GX P: 1 LMP: Patient's last menstrual period was 01/24/2000. 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 Before that, she has been employed at [employer], [employer], and as an elderly caregiver. 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 She is separated, and has not spoken to her husband, Catherine Lowery, in over 10 years. She lives with her only son, Catherine Lowery, who is disabled secondary to SCD and multiple surgeries. She has no pets. She attends [employer].    ADVANCED DIRECTIVES: Her sister, Adria Dill, is Catherine Lowery's medical power of attorney. Catherine Lowery can be reached at [phone]. Catherine Lowery has stated that her husband, Catherine Lowery, is not to have access to her medical records or to make decisions on her behalf. ]   HEALTH MAINTENANCE: Social History   Tobacco Use  . Smoking status: Current Every Day Smoker    Packs/day: 0.50    Years: 20.00    Pack years: 10.00  . Smokeless tobacco: Never Used  . Tobacco comment: 06/06/16 Pt stated "I'm trying to quit"  Substance Use Topics  . Alcohol use: No    Alcohol/week: 0.0 standard drinks  . Drug use: No     Colonoscopy: 01/2015  PAP:   Bone density:  02/26/2018: T-score 1.4   Allergies  Allergen Reactions  . Morphine And Related Itching  . Other Itching and Nausea And Vomiting    "PAIN MEDICINES"  . Percocet [Oxycodone-Acetaminophen] Nausea And Vomiting    Current Outpatient Medications  Medication Sig Dispense Refill  . albuterol (PROVENTIL HFA;VENTOLIN HFA) 108 (90 Base) MCG/ACT inhaler Inhale 2 puffs into the lungs every 6 (six) hours as needed for wheezing or shortness of breath. 1 Inhaler 0  . aspirin 325 MG tablet Take 325 mg by mouth daily. Stop date 01/30/16    . atorvastatin (LIPITOR) 40 MG tablet Take 1 tablet (40 mg total) by mouth daily. 90 tablet 3  . Cholecalciferol (VITAMIN D3) 2000 units TABS Take 1 tablet by mouth daily. 30 tablet 11  . hydrochlorothiazide (MICROZIDE) 12.5 MG capsule Take 1 capsule (12.5 mg total) by mouth every morning. 90 capsule 2  . HYDROcodone-acetaminophen (NORCO/VICODIN) 5-325 MG tablet Take 1 tablet by mouth every 6 (six) hours as needed for moderate pain. 10 tablet 0  . ibuprofen (ADVIL,MOTRIN) 800 MG tablet Take 1 tablet (800 mg total) by mouth every 8 (eight) hours as needed. 30 tablet 0  . Omega-3 Fatty Acids (FISH OIL) 1000 MG CPDR Take by mouth.    . ondansetron (ZOFRAN) 4 MG tablet Take 1 tablet (4 mg total) by mouth daily as needed  for nausea or vomiting. 30 tablet 1  . primidone (MYSOLINE) 50 MG tablet Take 2 tablets (100 mg total) by mouth every morning. 180 tablet 4  . propranolol (INDERAL) 40 MG tablet Take 1 tablet (40 mg total) by mouth 2 (two) times daily. 180 tablet 1  . VOLTAREN 1 % GEL APPLY TOPICALLY 4 TIMES DAILY AS NEEDED 100 g 0   No current facility-administered medications for this visit.      OBJECTIVE: Middle-aged African-American woman who appears stated age  64:   05/23/18 1526  BP: (!) 131/59  Pulse: (!) 52  Resp: 18  Temp: (!) 97.4 F (36.3 C)  SpO2: 100%     Body mass index is 25.77 kg/m.   Wt Readings from Last 3 Encounters:  05/23/18 174 lb 8 oz (79.2 kg)  05/07/18 173 lb 4.5 oz (78.6 kg)  04/30/18 174 lb 9.6 oz (79.2 kg)      ECOG FS:1 - Symptomatic but completely ambulatory  Sclerae unicteric, EOMs intact No cervical or supraclavicular adenopathy Lungs no rales or rhonchi Heart regular rate and rhythm Abd soft, nontender, positive bowel sounds MSK no focal spinal tenderness, no upper extremity lymphedema Neuro: nonfocal, well oriented, appropriate affect Breasts: The right breast is status post recent lumpectomy.  The incision, near the inframammary fold, is healing nicely, with no dehiscence erythema or swelling.  The cosmetic result is good.  The left breast is benign.  Both axillae are benign.  LAB RESULTS:  CMP     Component Value Date/Time   NA 140 05/03/2018 1455   K 4.6 05/03/2018 1455   CL 107 05/03/2018 1455   CO2 27 05/03/2018 1455   GLUCOSE 95 05/03/2018 1455   BUN 14 05/03/2018 1455   CREATININE 1.18 (H) 05/03/2018 1455   CREATININE 1.13 (H) 01/23/2018 0804   CALCIUM 9.1 05/03/2018 1455   PROT 6.9 05/03/2018 1455   ALBUMIN 3.6 05/03/2018 1455   AST 21 05/03/2018 1455   ALT 22 05/03/2018 1455   ALKPHOS 72 05/03/2018 1455   BILITOT 0.5 05/03/2018 1455   GFRNONAA 45 (L) 05/03/2018 1455   GFRNONAA 48 (L) 01/23/2018 0804   GFRAA 53 (L) 05/03/2018  1455   GFRAA 56 (L) 01/23/2018 0804    No results found for: TOTALPROTELP, ALBUMINELP, A1GS, A2GS, BETS, BETA2SER, GAMS, MSPIKE, SPEI  No results found for: KPAFRELGTCHN, LAMBDASER, KAPLAMBRATIO  Lab Results  Component Value Date   WBC 4.5 05/03/2018   NEUTROABS 1.2 (L) 05/03/2018   HGB 11.4 (L) 05/03/2018   HCT 36.5 05/03/2018   MCV 71.4 (L) 05/03/2018   PLT 183 05/03/2018    '@LASTCHEMISTRY'$ @  No results found for: LABCA2  No components found for: KYHCWC376  No results for input(s): INR in the last 168 hours.  No results found for: LABCA2  No results found for: EGB151  No results found for: VOH607  No results found for: PXT062  No results found for: CA2729  No components found for: HGQUANT  No results found for: CEA1 / No results found for: CEA1   No results found for: AFPTUMOR  No results found for: CHROMOGRNA  No results found for: PSA1  No visits with results within 3 Day(s) from this visit.  Latest known visit with results is:  Admission on 05/07/2018, Discharged on 05/07/2018  Component Date Value Ref Range Status  . WBC 05/03/2018 4.5  4.0 - 10.5 K/uL Final  . RBC 05/03/2018 5.11  3.87 - 5.11 MIL/uL Final  . Hemoglobin 05/03/2018 11.4* 12.0 - 15.0 g/dL Final  . HCT 05/03/2018 36.5  36.0 - 46.0 % Final  . MCV 05/03/2018 71.4* 80.0 - 100.0 fL Final  . MCH 05/03/2018 22.3* 26.0 - 34.0 pg Final  . MCHC 05/03/2018 31.2  30.0 - 36.0 g/dL Final  . RDW 05/03/2018 15.0  11.5 - 15.5 % Final  . Platelets 05/03/2018 183  150 - 400 K/uL Final  . nRBC 05/03/2018 0.0  0.0 - 0.2 % Final  . Neutrophils Relative % 05/03/2018 27  % Final  . Neutro Abs 05/03/2018 1.2* 1.7 - 7.7 K/uL Final  .  Lymphocytes Relative 05/03/2018 63  % Final  . Lymphs Abs 05/03/2018 2.8  0.7 - 4.0 K/uL Final  . Monocytes Relative 05/03/2018 4  % Final  . Monocytes Absolute 05/03/2018 0.2  0.1 - 1.0 K/uL Final  . Eosinophils Relative 05/03/2018 1  % Final  . Eosinophils Absolute  05/03/2018 0.0  0.0 - 0.5 K/uL Final  . Basophils Relative 05/03/2018 5  % Final  . Basophils Absolute 05/03/2018 0.2* 0.0 - 0.1 K/uL Final  . Abs Immature Granulocytes 05/03/2018 0.00  0.00 - 0.07 K/uL Final   Performed at [facility], [place] [address], , [place] [address]  . Sodium 05/03/2018 140  135 - 145 mmol/L Final  . Potassium 05/03/2018 4.6  3.5 - 5.1 mmol/L Final  . Chloride 05/03/2018 107  98 - 111 mmol/L Final  . CO2 05/03/2018 27  22 - 32 mmol/L Final  . Glucose, Bld 05/03/2018 95  70 - 99 mg/dL Final  . BUN 05/03/2018 14  8 - 23 mg/dL Final  . Creatinine, Ser 05/03/2018 1.18* 0.44 - 1.00 mg/dL Final  . Calcium 05/03/2018 9.1  8.9 - 10.3 mg/dL Final  . Total Protein 05/03/2018 6.9  6.5 - 8.1 g/dL Final  . Albumin 05/03/2018 3.6  3.5 - 5.0 g/dL Final  . AST 05/03/2018 21  15 - 41 U/L Final  . ALT 05/03/2018 22  0 - 44 U/L Final  . Alkaline Phosphatase 05/03/2018 72  38 - 126 U/L Final  . Total Bilirubin 05/03/2018 0.5  0.3 - 1.2 mg/dL Final  . GFR calc non Af Amer 05/03/2018 45* >60 mL/min Final  . GFR calc Af Amer 05/03/2018 53* >60 mL/min Final  . Anion gap 05/03/2018 6  5 - 15 Final   Performed at [facility], [place] [address] [place], [place] [address]    (this displays the last labs from the last 3 days)  No results found for: TOTALPROTELP, ALBUMINELP, A1GS, A2GS, BETS, BETA2SER, GAMS, MSPIKE, SPEI (this displays SPEP labs)  No results found for: KPAFRELGTCHN, LAMBDASER, KAPLAMBRATIO (kappa/lambda light chains)  No results found for: HGBA, HGBA2QUANT, HGBFQUANT, HGBSQUAN (Hemoglobinopathy evaluation)   No results found for: LDH  No results found for: IRON, TIBC, IRONPCTSAT (Iron and TIBC)  No results found for: FERRITIN  Urinalysis    Component Value Date/Time   COLORURINE YELLOW 09/21/2017 1533   APPEARANCEUR CLOUDY (A) 09/21/2017 1533   LABSPEC 1.015 09/21/2017 1533   PHURINE 5.5 09/21/2017 1533   GLUCOSEU NEGATIVE 09/21/2017 1533    HGBUR TRACE (A) 09/21/2017 1533   BILIRUBINUR SMALL (A) 03/24/2011 1054   KETONESUR NEGATIVE 09/21/2017 1533   PROTEINUR NEGATIVE 09/21/2017 1533   UROBILINOGEN 0.2 03/24/2011 1054   NITRITE NEGATIVE 09/21/2017 1533   LEUKOCYTESUR TRACE (A) 09/21/2017 1533     STUDIES:  Nm Sentinel Node Inj-no Rpt (breast)  Result Date: 05/07/2018 Sulfur colloid was injected by the nuclear medicine technologist for melanoma sentinel node.    ELIGIBLE FOR AVAILABLE RESEARCH PROTOCOL: no   ASSESSMENT: 73 y.o. [employer], [place] woman status post right breast lower inner quadrant biopsy 03/18/2018 for a clinical T2N0, [name], grade 2 or 3, estrogen and progesterone receptor positive, [name], with an MIB-1 of 5%  (1) Genetic testing performed through [facility] Common Hereditary Cancers Panel + Thyroid Cancer Panel on 05/09/2018 showing no deleterious mutations APC, [name], [name], [name], [name], [name], [name], [name], [name], [name], [name], [name], [name], [name], [name],  [name], [name], [name], [name], [name], [name], KIT, MEN1, [name], [name], [name], [name], [name], [name], [name], [name], [name], [name], [name], [name], [name], POLE, [name], [name], RAD50, [name], [name], [name], [name], [name], [name], [name], [name], [name], [name], [name], [name], [name], [name], [name], VHL.   (a) Two variants of uncertain significance in the genes [name] c.1176C>T (Silent) and [name] c.1419+4C>T (Intronic) were identified.  (2) right lumpectomy and sentinel lymph node sampling 05/07/2018 showed a pT2 pN1, [name], grade 2, with a positive inferior margin  (a) reexcision scheduled for 06/05/2018  (3) MammaPrint to be obtained from the definitive surgical sample  (4) adjuvant radiation as appropriate  (5) antiestrogens to start at the completion of local treatment   PLAN: Walterine did well with her surgery and I spent a good part of today's visit reviewing the pathology.   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  As a result the margins are more frequently positive.  This will be resolved hopefully when she has her next surgery scheduled for 06/05/2018  We also reviewed her genetics testing and she understands variants of uncertain significance are mostly eventually proven to be benign.  She does not carry a negative or bad mutation as far as we can tell  We do not have the MammaPrint results yet although those were sent on 05/13/2018.  She understands that if the MammaPrint comes low risk then she will proceed, after margin surgery, to radiation, and then return to see me to start antiestrogens  If the MammaPrint is high risk however she would need a port and we would proceed to chemotherapy, most likely CMF, after the margin surgery and before the radiation.  She would then again have the antiestrogens at the  She has a good prognosis either way assuming she follows standard procedure as just outlined, with more than 80% of stage II patients alive and cancer free 5 years after definitive surgery  I spent approximately 30 minutes face to face with Beula with more than 50% of that time spent in counseling and coordination of care.  We will call her with the MammaPrint results and make the appropriate follow-up appointment as soon as we have the data    Tomislav Micale, Virgie Dad, MD  05/23/18 3:53 PM Medical Oncology and Hematology [facility] [place], [place] [email or web address]  Tel. [phone]    Fax. [phone]   I, Jacqualyn Posey am acting as a Education administrator for Chauncey Cruel, MD.

## 2018-05-22 NOTE — H&P (View-Only) (Signed)
Catherine Lowery Documented: 05/22/2018 4:24 PM [facility] Patient #: [id number] DOB: 03/10/1946 Single / Language: English / Race: Black or African American Female  History of Present Illness (Catherine Lowery A. Azarius Lambson MD; 05/22/2018 4:53 PM) Patient words: Patient returns after right breast lumpectomy. Final pathology showed multiple mobile margins in metastatic carcinoma in 1 of 3 nodes with isolated tumor cells to the second node. I discussed the case with medical radiation oncology we all agree no further node dissection necessary since this will be covered during radiation. She is to return for reexcision week after next. She is doing well otherwise.        Diagnosis 1. Breast, lumpectomy, Right w/seed - INVASIVE LOBULAR CARCINOMA, GRADE 2, 2.8 CM. - LOBULAR CARCINOMA IN SITU. - CARCINOMA INVOLVES THE INFERIOR MARGIN AT MULTIPLE FOCI AND IS LESS THAN 1 MM FROM LATERAL, MEDIAL, POSTERIOR AND INFERIOR MARGINS AT MULTIPLE OTHER FOCI. - LYMPHOVASCULAR OR PERINEURAL INVASION IS NOT IDENTIFIED. - SEE ONCOLOGY TABLE. 2. Lymph node, sentinel, biopsy, Right Axillary - METASTATIC CARCINOMA TO A LYMPH NODE (1/1). - THE FOCUS OF METASTATIC CARCINOMA MEASURES 1.5 MM. 3. Lymph node, sentinel, biopsy, Right - ISOLATED TUMOR CELLS PRESENT (0/1). SEE NOTE 4. Lymph node, sentinel, biopsy, Right - LYMPH NODE, NEGATIVE FOR CARCINOMA (0/1). Microscopic Comment 1. INVASIVE CARCINOMA OF THE BREAST: Resection Procedure: Lumpectomy Specimen Laterality: Right Tumor Size: 2.8 cm. Histologic Type: Invasive lobular carcinoma Histologic Grade: Glandular (Acinar)/Tubular Differentiation: 3 Nuclear Pleomorphism: 2 Mitotic Rate: 1 Overall Grade: 2 Ductal Carcinoma In Situ: Not identified Tumor Extension: Not applicable Margins: Involved by invasive carcinoma, inferior margin at multiple foci Distance from closest margin (millimeters): Less than 1 mm Specify closest margin (required  only if <10mm): Lateral, medial, posterior and inferior margins 1 of 3 FINAL for Catherine Lowery ([id number]) Microscopic Comment(continued) DCIS Margins: Not applicable (required only if DCIS is present in specimen) Regional Lymph Nodes: Number of Lymph Nodes Examined: 3 Number of Sentinel Nodes Examined (if applicable): 3 Number of Lymph Nodes with Macrometastases (>2 mm): 0 Number of Lymph Nodes with Micrometastases: 1 Number of Lymph Nodes with Isolated Tumor Cells (?0.2 mm or ?200 cells)#: 1 Size of Largest Metastatic Deposit (millimeters): 1.5 mm Extranodal Extension: Not identified. Treatment Effect: No known presurgical therapy. Breast Biomarker Testing Performed on Previous Biopsy: Yes Testing Performed on Case Number: [id number] Estrogen Receptor: 95%, strong staining intensity Progesterone Receptor: 95%, strong staining intensity HER2: Negative (1+) Ki-67: 5% Representative tumor block: 1A Pathologic Stage Classification (pTNM, AJCC 8th Edition): pT2, pN1mi (v4.2.0.0) Diagnosis Note 3. Immunostain for AE1/AE3 highlights isolated tumor cells 4. Confirmed with immunostain for AE1/AE3 Catherine Kashikar MD Pathologist, Electronic Signature.  The patient is a 72 year old female.   Allergies (Catherine Lowery, CMA; 05/22/2018 4:24 PM) Morphine Sulfate *ANALGESICS - OPIOID* Itching. Allergies Reconciled  Medication History (Catherine Lowery, CMA; 05/22/2018 4:25 PM) Atorvastatin Calcium (40MG Tablet, Oral) Active. hydroCHLOROthiazide (12.5MG Tablet, Oral) Active. Propranolol HCl (40MG Tablet, Oral) Active. Albuterol (90MCG/ACT Aerosol Soln, Inhalation) Active. Mirtazapine (7.5MG Tablet, Oral) Active. Primidone (50MG Tablet, Oral) Active. Aspirin (325MG Tablet, Oral) Active. Medications Reconciled Propranolol HCl ER (80MG Capsule ER 24HR, Oral) Active. hydroCHLOROthiazide (12.5MG Capsule, Oral) Active. Ventolin HFA (108 (90 Base)MCG/ACT Aerosol Soln,  Inhalation) Active. Lipitor (10MG Tablet, Oral) Active.    Vitals (Catherine Lowery CMA; 05/22/2018 4:26 PM) 05/22/2018 4:25 PM Weight: 175.13 lb Height: 69.5in Body Surface Area: 1.96 m Body Mass Index: 25.49 kg/m  Temp.: 97.6F(Temporal)  Pulse: 97 (Regular)  P.OX: 97% (Room   air) BP: 140/68 (Sitting, Left Arm, Standard)      Physical Exam (Catherine Lowery A. Catherine Odle MD; 05/22/2018 4:53 PM)  Breast Note: Right breast incision clean dry and intact. Right x-ray incision clean dry and intact.    Assessment & Plan (Catherine Lowery A. Catherine Lincoln MD; 05/22/2018 4:54 PM)  POST-OPERATIVE STATE (Z98.890) Impression: Scheduled for reexcision week after next. Mammograms back over the next day or 2. If she is high-risk and may require chemotherapy and I'll discuss this with Dr. Magrinat  Current Plans Pt Education - [employer] Free Text Education/Instructions: discussed with patient and provided information. 

## 2018-05-22 NOTE — H&P (Signed)
Catherine Lowery Documented: 05/22/2018 4:24 PM Location: [facility] Patient #: [id number] DOB: [birth year] Single / Language: [name] / Race: Black or African American Female  History of Present Illness Marcello Moores A. Gerre Ranum MD; 05/22/2018 4:53 PM) Patient words: Patient returns after right breast lumpectomy. Final pathology showed multiple mobile margins in metastatic carcinoma in 1 of 3 nodes with isolated tumor cells to the second node. I discussed the case with medical radiation oncology we all agree no further node dissection necessary since this will be covered during radiation. She is to return for reexcision week after next. She is doing well otherwise.        Diagnosis 1. Breast, lumpectomy, Right w/seed - INVASIVE LOBULAR CARCINOMA, GRADE 2, 2.8 CM. - LOBULAR CARCINOMA IN SITU. - CARCINOMA INVOLVES THE INFERIOR MARGIN AT MULTIPLE FOCI AND IS LESS THAN 1 MM FROM LATERAL, MEDIAL, POSTERIOR AND INFERIOR MARGINS AT MULTIPLE OTHER FOCI. - LYMPHOVASCULAR OR PERINEURAL INVASION IS NOT IDENTIFIED. - SEE ONCOLOGY TABLE. 2. Lymph node, sentinel, biopsy, Right Axillary - METASTATIC CARCINOMA TO A LYMPH NODE (1/1). - THE FOCUS OF METASTATIC CARCINOMA MEASURES 1.5 MM. 3. Lymph node, sentinel, biopsy, Right - ISOLATED TUMOR CELLS PRESENT (0/1). SEE NOTE 4. Lymph node, sentinel, biopsy, Right - LYMPH NODE, NEGATIVE FOR CARCINOMA (0/1). Microscopic Comment 1. INVASIVE CARCINOMA OF THE BREAST: Resection Procedure: Lumpectomy Specimen Laterality: Right Tumor Size: 2.8 cm. Histologic Type: Invasive lobular carcinoma Histologic Grade: Glandular (Acinar)/Tubular Differentiation: 3 Nuclear Pleomorphism: 2 Mitotic Rate: 1 Overall Grade: 2 Ductal Carcinoma In Situ: Not identified Tumor Extension: Not applicable Margins: Involved by invasive carcinoma, inferior margin at multiple foci Distance from closest margin (millimeters): Less than 1 mm Specify closest margin (required  only if <1m): Lateral, medial, posterior and inferior margins 1 of 3 FINAL for TLETESHA, KLECKER([id number]) Microscopic Comment(continued) DCIS Margins: Not applicable (required only if DCIS is present in specimen) Regional Lymph Nodes: Number of Lymph Nodes Examined: 3 Number of Sentinel Nodes Examined (if applicable): 3 Number of Lymph Nodes with Macrometastases (>2 mm): 0 Number of Lymph Nodes with Micrometastases: 1 Number of Lymph Nodes with Isolated Tumor Cells (?0.2 mm or ?200 cells)#: 1 Size of Largest Metastatic Deposit (millimeters): 1.5 mm Extranodal Extension: Not identified. Treatment Effect: No known presurgical therapy. Breast Biomarker Testing Performed on Previous Biopsy: Yes Testing Performed on Case Number: [id number] Receptor: 95%, strong staining intensity Progesterone Receptor: 95%, strong staining intensity HER2: Negative (1+) Ki-67: 5% Representative tumor block: 1A Pathologic Stage Classification (pTNM, AJCC 8th Edition): pT2, pN170m(v4.2.0.0) Diagnosis Note 3. Immunostain for AE1/AE3 highlights isolated tumor cells 4. Confirmed with immunostain for AE1/AE3 NiJaquita FoldsD Pathologist, Electronic Signature.  The patient is a [age]ear old female.   Allergies (Sabrina Canty, CMA; 05/22/2018 4:24 PM) Morphine Sulfate *ANALGESICS - OPIOID* Itching. Allergies Reconciled  Medication History (SNance PewCMA; 05/22/2018 4:25 PM) Atorvastatin Calcium (40MG Tablet, Oral) Active. hydroCHLOROthiazide (12.5MG Tablet, Oral) Active. Propranolol HCl (40MG Tablet, Oral) Active. Albuterol (90MCG/ACT Aerosol Soln, Inhalation) Active. Mirtazapine (7.5MG Tablet, Oral) Active. Primidone (50MG Tablet, Oral) Active. Aspirin (325MG Tablet, Oral) Active. Medications Reconciled Propranolol HCl ER (80MG Capsule ER 24HR, Oral) Active. hydroCHLOROthiazide (12.5MG Capsule, Oral) Active. Ventolin HFA (108 (90 Base)MCG/ACT Aerosol Soln,  Inhalation) Active. Lipitor (10MG Tablet, Oral) Active.    Vitals (Sabrina Canty CMA; 05/22/2018 4:26 PM) 05/22/2018 4:25 PM Weight: 175.13 lb Height: 69.5in Body Surface Area: 1.96 m Body Mass Index: 25.49 kg/m  Temp.: 97.58F(Temporal)  Pulse: 97 (Regular)  P.OX: 97% (Room  air) BP: 140/68 (Sitting, Left Arm, Standard)      Physical Exam (Ashwath Lasch A. Aundra Espin MD; 05/22/2018 4:53 PM)  Breast Note: Right breast incision clean dry and intact. Right x-ray incision clean dry and intact.    Assessment & Plan (Mustafa Potts A. Arihanna Estabrook MD; 05/22/2018 4:54 PM)  POST-OPERATIVE STATE [phone]) Impression: Scheduled for reexcision week after next. Mammograms back over the next day or 2. If she is high-risk and may require chemotherapy and I'll discuss this with Dr. Jana Hakim  Current Plans Pt Education - [employer] Free Text Education/Instructions: discussed with patient and provided information.

## 2018-05-23 ENCOUNTER — Inpatient Hospital Stay (HOSPITAL_BASED_OUTPATIENT_CLINIC_OR_DEPARTMENT_OTHER): Payer: Medicare HMO | Admitting: Oncology

## 2018-05-23 VITALS — BP 131/59 | HR 52 | Temp 97.4°F | Resp 18 | Ht 69.0 in | Wt 174.5 lb

## 2018-05-23 DIAGNOSIS — C50311 Malignant neoplasm of lower-inner quadrant of right female breast: Secondary | ICD-10-CM | POA: Diagnosis not present

## 2018-05-23 DIAGNOSIS — Z170 Estrogen receptor positive status [ER+]: Secondary | ICD-10-CM

## 2018-05-23 DIAGNOSIS — Z720 Tobacco use: Secondary | ICD-10-CM

## 2018-05-23 DIAGNOSIS — J181 Lobar pneumonia, unspecified organism: Secondary | ICD-10-CM

## 2018-05-23 DIAGNOSIS — J189 Pneumonia, unspecified organism: Secondary | ICD-10-CM

## 2018-05-24 ENCOUNTER — Encounter (HOSPITAL_COMMUNITY): Payer: Self-pay | Admitting: Oncology

## 2018-05-24 ENCOUNTER — Telehealth: Payer: Self-pay | Admitting: Oncology

## 2018-05-24 ENCOUNTER — Other Ambulatory Visit: Payer: Self-pay | Admitting: Oncology

## 2018-05-24 NOTE — Telephone Encounter (Signed)
No los °

## 2018-05-24 NOTE — Progress Notes (Signed)
I called Catherine Lowery with her MammaPrint results.  She was understandably pleased.  She will proceed directly to radiation.

## 2018-05-27 ENCOUNTER — Other Ambulatory Visit: Payer: Self-pay | Admitting: Oncology

## 2018-05-27 DIAGNOSIS — C50311 Malignant neoplasm of lower-inner quadrant of right female breast: Secondary | ICD-10-CM

## 2018-05-27 DIAGNOSIS — Z170 Estrogen receptor positive status [ER+]: Principal | ICD-10-CM

## 2018-05-28 ENCOUNTER — Encounter (HOSPITAL_BASED_OUTPATIENT_CLINIC_OR_DEPARTMENT_OTHER): Payer: Self-pay

## 2018-05-28 ENCOUNTER — Telehealth: Payer: Self-pay | Admitting: *Deleted

## 2018-05-28 ENCOUNTER — Other Ambulatory Visit: Payer: Self-pay

## 2018-05-28 NOTE — Telephone Encounter (Signed)
Received mammaprint results of low risk.  Patient aware.  Msg sent for an appointment with Dr. Sondra Come.

## 2018-05-31 ENCOUNTER — Encounter (HOSPITAL_BASED_OUTPATIENT_CLINIC_OR_DEPARTMENT_OTHER)
Admission: RE | Admit: 2018-05-31 | Discharge: 2018-05-31 | Disposition: A | Discharge status: Home / Self Care | Payer: Medicare HMO | Source: Ambulatory Visit | Attending: Surgery | Admitting: Surgery

## 2018-05-31 DIAGNOSIS — Z01812 Encounter for preprocedural laboratory examination: Secondary | ICD-10-CM | POA: Diagnosis present

## 2018-05-31 LAB — BASIC METABOLIC PANEL
ANION GAP: 9 (ref 5–15)
BUN: 24 mg/dL — ABNORMAL HIGH (ref 8–23)
CO2: 26 mmol/L (ref 22–32)
Calcium: 9.4 mg/dL (ref 8.9–10.3)
Chloride: 107 mmol/L (ref 98–111)
Creatinine, Ser: 1.32 mg/dL — ABNORMAL HIGH (ref 0.44–1.00)
GFR calc non Af Amer: 40 mL/min — ABNORMAL LOW (ref >60)
GFR, EST AFRICAN AMERICAN: 46 mL/min — AB (ref >60)
Glucose, Bld: 89 mg/dL (ref 70–99)
Potassium: 4.2 mmol/L (ref 3.5–5.1)
Sodium: 142 mmol/L (ref 135–145)

## 2018-05-31 NOTE — Progress Notes (Signed)
Ensure pre surgery drink given with instructions to complete by 0800 dos, pt verbalized understanding. 

## 2018-06-05 ENCOUNTER — Ambulatory Visit (HOSPITAL_BASED_OUTPATIENT_CLINIC_OR_DEPARTMENT_OTHER)
Admission: RE | Admit: 2018-06-05 | Discharge: 2018-06-05 | Disposition: A | Discharge status: Home / Self Care | Payer: Medicare HMO | Attending: Surgery | Admitting: Surgery

## 2018-06-05 ENCOUNTER — Ambulatory Visit (HOSPITAL_BASED_OUTPATIENT_CLINIC_OR_DEPARTMENT_OTHER): Payer: Medicare HMO | Admitting: Anesthesiology

## 2018-06-05 ENCOUNTER — Encounter (HOSPITAL_BASED_OUTPATIENT_CLINIC_OR_DEPARTMENT_OTHER)
Admission: RE | Disposition: A | Discharge status: Home / Self Care | Payer: Self-pay | Source: Home / Self Care | Attending: Surgery

## 2018-06-05 ENCOUNTER — Encounter (HOSPITAL_BASED_OUTPATIENT_CLINIC_OR_DEPARTMENT_OTHER): Payer: Self-pay

## 2018-06-05 ENCOUNTER — Other Ambulatory Visit: Payer: Self-pay

## 2018-06-05 DIAGNOSIS — C50911 Malignant neoplasm of unspecified site of right female breast: Secondary | ICD-10-CM | POA: Diagnosis not present

## 2018-06-05 DIAGNOSIS — Z885 Allergy status to narcotic agent status: Secondary | ICD-10-CM | POA: Diagnosis not present

## 2018-06-05 DIAGNOSIS — I129 Hypertensive chronic kidney disease with stage 1 through stage 4 chronic kidney disease, or unspecified chronic kidney disease: Secondary | ICD-10-CM | POA: Insufficient documentation

## 2018-06-05 DIAGNOSIS — C773 Secondary and unspecified malignant neoplasm of axilla and upper limb lymph nodes: Secondary | ICD-10-CM | POA: Insufficient documentation

## 2018-06-05 DIAGNOSIS — Z79899 Other long term (current) drug therapy: Secondary | ICD-10-CM | POA: Insufficient documentation

## 2018-06-05 DIAGNOSIS — Z7982 Long term (current) use of aspirin: Secondary | ICD-10-CM | POA: Diagnosis not present

## 2018-06-05 DIAGNOSIS — Z803 Family history of malignant neoplasm of breast: Secondary | ICD-10-CM | POA: Diagnosis not present

## 2018-06-05 DIAGNOSIS — F17200 Nicotine dependence, unspecified, uncomplicated: Secondary | ICD-10-CM | POA: Diagnosis not present

## 2018-06-05 DIAGNOSIS — E785 Hyperlipidemia, unspecified: Secondary | ICD-10-CM | POA: Diagnosis not present

## 2018-06-05 DIAGNOSIS — N189 Chronic kidney disease, unspecified: Secondary | ICD-10-CM | POA: Diagnosis not present

## 2018-06-05 HISTORY — PX: RE-EXCISION OF BREAST LUMPECTOMY: SHX6048

## 2018-06-05 SURGERY — EXCISION, LESION, BREAST
Anesthesia: General | Site: Breast | Laterality: Right

## 2018-06-05 MED ORDER — MIDAZOLAM HCL 2 MG/2ML IJ SOLN
1.0000 mg | INTRAMUSCULAR | Status: DC | PRN
  Administered 2018-06-05: 2 mg via INTRAVENOUS

## 2018-06-05 MED ORDER — MEPERIDINE HCL 25 MG/ML IJ SOLN: 6.2500 mg | INTRAMUSCULAR | Status: DC | PRN

## 2018-06-05 MED ORDER — GABAPENTIN 300 MG PO CAPS
300.0000 mg | ORAL_CAPSULE | ORAL | Status: AC
  Administered 2018-06-05: 300 mg via ORAL

## 2018-06-05 MED ORDER — DEXTROSE 5 % IV SOLN
3.0000 g | INTRAVENOUS | Status: AC
  Administered 2018-06-05: 2 g via INTRAVENOUS

## 2018-06-05 MED ORDER — ROPIVACAINE HCL 7.5 MG/ML IJ SOLN
INTRAMUSCULAR | Status: DC | PRN
  Administered 2018-06-05: 30 mL via PERINEURAL

## 2018-06-05 MED ORDER — BUPIVACAINE HCL (PF) 0.25 % IJ SOLN
INTRAMUSCULAR | Status: DC | PRN
  Administered 2018-06-05: 10 mL

## 2018-06-05 MED ORDER — ACETAMINOPHEN 160 MG/5ML PO SOLN: 325.0000 mg | ORAL | Status: DC | PRN

## 2018-06-05 MED ORDER — MIDAZOLAM HCL 2 MG/2ML IJ SOLN
INTRAMUSCULAR | Status: AC
  Filled 2018-06-05: qty 2

## 2018-06-05 MED ORDER — CELECOXIB 200 MG PO CAPS
ORAL_CAPSULE | ORAL | Status: AC
  Filled 2018-06-05: qty 1

## 2018-06-05 MED ORDER — CEFAZOLIN SODIUM-DEXTROSE 2-4 GM/100ML-% IV SOLN
INTRAVENOUS | Status: AC
  Filled 2018-06-05: qty 100

## 2018-06-05 MED ORDER — OXYCODONE HCL 5 MG PO TABS: 5.0000 mg | ORAL_TABLET | Freq: Once | ORAL | Status: DC | PRN

## 2018-06-05 MED ORDER — PROPOFOL 10 MG/ML IV BOLUS
INTRAVENOUS | Status: AC
  Filled 2018-06-05: qty 20

## 2018-06-05 MED ORDER — LACTATED RINGERS IV SOLN
INTRAVENOUS | Status: DC
  Administered 2018-06-05: 11:00:00 via INTRAVENOUS

## 2018-06-05 MED ORDER — ACETAMINOPHEN 325 MG PO TABS: 325.0000 mg | ORAL_TABLET | ORAL | Status: DC | PRN

## 2018-06-05 MED ORDER — IBUPROFEN 800 MG PO TABS: 800.0000 mg | ORAL_TABLET | Freq: Three times a day (TID) | ORAL | 0 refills | Status: DC | PRN

## 2018-06-05 MED ORDER — CHLORHEXIDINE GLUCONATE CLOTH 2 % EX PADS: 6.0000 | MEDICATED_PAD | Freq: Once | CUTANEOUS | Status: DC

## 2018-06-05 MED ORDER — CELECOXIB 400 MG PO CAPS
400.0000 mg | ORAL_CAPSULE | ORAL | Status: AC
  Administered 2018-06-05: 400 mg via ORAL

## 2018-06-05 MED ORDER — PROPOFOL 10 MG/ML IV BOLUS
INTRAVENOUS | Status: DC | PRN
  Administered 2018-06-05: 100 mg via INTRAVENOUS

## 2018-06-05 MED ORDER — FENTANYL CITRATE (PF) 100 MCG/2ML IJ SOLN: 25.0000 ug | INTRAMUSCULAR | Status: DC | PRN

## 2018-06-05 MED ORDER — CLONIDINE HCL (ANALGESIA) 100 MCG/ML EP SOLN
EPIDURAL | Status: DC | PRN
  Administered 2018-06-05: 100 ug

## 2018-06-05 MED ORDER — DEXAMETHASONE SODIUM PHOSPHATE 4 MG/ML IJ SOLN
INTRAMUSCULAR | Status: DC | PRN
  Administered 2018-06-05: 8 mg via INTRAVENOUS

## 2018-06-05 MED ORDER — SCOPOLAMINE 1 MG/3DAYS TD PT72: 1.0000 | MEDICATED_PATCH | Freq: Once | TRANSDERMAL | Status: DC | PRN

## 2018-06-05 MED ORDER — GABAPENTIN 300 MG PO CAPS
ORAL_CAPSULE | ORAL | Status: AC
  Filled 2018-06-05: qty 1

## 2018-06-05 MED ORDER — FENTANYL CITRATE (PF) 100 MCG/2ML IJ SOLN
INTRAMUSCULAR | Status: AC
  Filled 2018-06-05: qty 2

## 2018-06-05 MED ORDER — 0.9 % SODIUM CHLORIDE (POUR BTL) OPTIME
TOPICAL | Status: DC | PRN
  Administered 2018-06-05: 200 mL

## 2018-06-05 MED ORDER — ONDANSETRON HCL 4 MG/2ML IJ SOLN: 4.0000 mg | Freq: Once | INTRAMUSCULAR | Status: DC | PRN

## 2018-06-05 MED ORDER — OXYCODONE HCL 5 MG/5ML PO SOLN: 5.0000 mg | Freq: Once | ORAL | Status: DC | PRN

## 2018-06-05 MED ORDER — FENTANYL CITRATE (PF) 100 MCG/2ML IJ SOLN
50.0000 ug | INTRAMUSCULAR | Status: DC | PRN
  Administered 2018-06-05: 100 ug via INTRAVENOUS
  Administered 2018-06-05: 25 ug via INTRAVENOUS

## 2018-06-05 SURGICAL SUPPLY — 71 items
APPLIER CLIP 11 MED OPEN (CLIP)
APPLIER CLIP 9.375 MED OPEN (MISCELLANEOUS)
BINDER BREAST LRG (GAUZE/BANDAGES/DRESSINGS) IMPLANT
BINDER BREAST MEDIUM (GAUZE/BANDAGES/DRESSINGS) IMPLANT
BINDER BREAST XLRG (GAUZE/BANDAGES/DRESSINGS) ×4 IMPLANT
BINDER BREAST XXLRG (GAUZE/BANDAGES/DRESSINGS) IMPLANT
BIOPATCH RED 1 DISK 7.0 (GAUZE/BANDAGES/DRESSINGS) IMPLANT
BIOPATCH RED 1IN DISK 7.0MM (GAUZE/BANDAGES/DRESSINGS)
BLADE CLIPPER SURG (BLADE) IMPLANT
BLADE SURG 15 STRL LF DISP TIS (BLADE) ×2 IMPLANT
BLADE SURG 15 STRL SS (BLADE) ×2
CANISTER SUCT 1200ML W/VALVE (MISCELLANEOUS) ×4 IMPLANT
CHLORAPREP W/TINT 26ML (MISCELLANEOUS) ×4 IMPLANT
CLIP APPLIE 11 MED OPEN (CLIP) IMPLANT
CLIP APPLIE 9.375 MED OPEN (MISCELLANEOUS) IMPLANT
COVER BACK TABLE 60X90IN (DRAPES) ×4 IMPLANT
COVER MAYO STAND STRL (DRAPES) ×4 IMPLANT
COVER WAND RF STERILE (DRAPES) IMPLANT
DECANTER SPIKE VIAL GLASS SM (MISCELLANEOUS) IMPLANT
DERMABOND ADVANCED (GAUZE/BANDAGES/DRESSINGS) ×2
DERMABOND ADVANCED .7 DNX12 (GAUZE/BANDAGES/DRESSINGS) ×2 IMPLANT
DRAIN CHANNEL 19F RND (DRAIN) IMPLANT
DRAPE LAPAROSCOPIC ABDOMINAL (DRAPES) IMPLANT
DRAPE LAPAROTOMY 100X72 PEDS (DRAPES) ×4 IMPLANT
DRAPE UTILITY XL STRL (DRAPES) ×4 IMPLANT
ELECT COATED BLADE 2.86 ST (ELECTRODE) ×4 IMPLANT
ELECT REM PT RETURN 9FT ADLT (ELECTROSURGICAL) ×4
ELECTRODE REM PT RTRN 9FT ADLT (ELECTROSURGICAL) ×2 IMPLANT
EVACUATOR SILICONE 100CC (DRAIN) IMPLANT
GAUZE SPONGE 4X4 12PLY STRL (GAUZE/BANDAGES/DRESSINGS) IMPLANT
GLOVE BIO SURGEON STRL SZ 6.5 (GLOVE) ×3 IMPLANT
GLOVE BIO SURGEONS STRL SZ 6.5 (GLOVE) ×1
GLOVE BIOGEL PI IND STRL 7.0 (GLOVE) ×2 IMPLANT
GLOVE BIOGEL PI IND STRL 7.5 (GLOVE) ×2 IMPLANT
GLOVE BIOGEL PI IND STRL 8 (GLOVE) ×2 IMPLANT
GLOVE BIOGEL PI INDICATOR 7.0 (GLOVE) ×2
GLOVE BIOGEL PI INDICATOR 7.5 (GLOVE) ×2
GLOVE BIOGEL PI INDICATOR 8 (GLOVE) ×2
GLOVE ECLIPSE 8.0 STRL XLNG CF (GLOVE) ×4 IMPLANT
GLOVE SURG SS PI 7.0 STRL IVOR (GLOVE) ×4 IMPLANT
GOWN STRL REUS W/ TWL LRG LVL3 (GOWN DISPOSABLE) ×4 IMPLANT
GOWN STRL REUS W/ TWL XL LVL3 (GOWN DISPOSABLE) ×2 IMPLANT
GOWN STRL REUS W/TWL LRG LVL3 (GOWN DISPOSABLE) ×4
GOWN STRL REUS W/TWL XL LVL3 (GOWN DISPOSABLE) ×2
HEMOSTAT ARISTA ABSORB 3G PWDR (HEMOSTASIS) IMPLANT
HEMOSTAT SURGICEL 2X14 (HEMOSTASIS) IMPLANT
KIT MARKER MARGIN INK (KITS) ×4 IMPLANT
NEEDLE HYPO 25X1 1.5 SAFETY (NEEDLE) ×4 IMPLANT
NS IRRIG 1000ML POUR BTL (IV SOLUTION) ×4 IMPLANT
PACK BASIN DAY SURGERY FS (CUSTOM PROCEDURE TRAY) ×4 IMPLANT
PENCIL BUTTON HOLSTER BLD 10FT (ELECTRODE) ×4 IMPLANT
PIN SAFETY STERILE (MISCELLANEOUS) IMPLANT
SLEEVE SCD COMPRESS KNEE MED (MISCELLANEOUS) ×4 IMPLANT
SPONGE LAP 4X18 RFD (DISPOSABLE) ×4 IMPLANT
STAPLER VISISTAT 35W (STAPLE) IMPLANT
SUT ETHILON 3 0 PS 1 (SUTURE) ×4 IMPLANT
SUT MNCRL AB 4-0 PS2 18 (SUTURE) ×4 IMPLANT
SUT MON AB 4-0 PC3 18 (SUTURE) ×4 IMPLANT
SUT SILK 2 0 SH (SUTURE) IMPLANT
SUT SILK 3 0 SH 30 (SUTURE) IMPLANT
SUT VIC AB 2-0 SH 27 (SUTURE)
SUT VIC AB 2-0 SH 27XBRD (SUTURE) IMPLANT
SUT VICRYL 3-0 CR8 SH (SUTURE) ×4 IMPLANT
SUT VICRYL AB 2 0 TIE (SUTURE) IMPLANT
SUT VICRYL AB 2 0 TIES (SUTURE)
SYR CONTROL 10ML LL (SYRINGE) ×4 IMPLANT
TOWEL GREEN STERILE FF (TOWEL DISPOSABLE) ×8 IMPLANT
TRAY FAXITRON CT DISP (TRAY / TRAY PROCEDURE) IMPLANT
TUBE CONNECTING 20'X1/4 (TUBING) ×1
TUBE CONNECTING 20X1/4 (TUBING) ×3 IMPLANT
YANKAUER SUCT BULB TIP NO VENT (SUCTIONS) ×4 IMPLANT

## 2018-06-05 NOTE — Anesthesia Procedure Notes (Signed)
Procedure Name: LMA Insertion Date/Time: 06/05/2018 11:26 AM Performed by: Willa Frater, CRNA Pre-anesthesia Checklist: Patient identified, Emergency Drugs available, Suction available and Patient being monitored Patient Re-evaluated:Patient Re-evaluated prior to induction Oxygen Delivery Method: Circle system utilized Preoxygenation: Pre-oxygenation with 100% oxygen Induction Type: IV induction Ventilation: Mask ventilation without difficulty LMA: LMA inserted LMA Size: 4.0 Number of attempts: 1 Airway Equipment and Method: Bite block Placement Confirmation: positive ETCO2 Tube secured with: Tape Dental Injury: Teeth and Oropharynx as per pre-operative assessment

## 2018-06-05 NOTE — Op Note (Signed)
Preoperative diagnosis: Right breast cancer  Postoperative diagnosis: Same  Procedure: Reexcision right breast lumpectomy  Surgeon: [name], MD  Anesthesia: General with local and pectoral block  EBL: 20 cc  Specimen lumpectomy cavity margins to pathology  Drains: None  IV fluids: Per anesthesia record  Indications for procedure: Patient is a 73 year old female with history of previous right breast lumpectomy for this T2 N1 lobular carcinoma right breast.  She returns for positive margins and reexcision.  Risk and benefits were discussed.  Risk of the surgery discussed.  She agreed to proceed.The procedure has been discussed with the patient. Alternatives to surgery have been discussed with the patient.  Risks of surgery include bleeding,  Infection,  Seroma formation, death,  and the need for further surgery.   The patient understands and wishes to proceed.   Description of procedure: The patient was seen in the holding area.  Right breast was marked as correct side and she underwent a pectoral block.  She was taken back to the operating.  Timeout was done.  Right breast was then prepped and draped in sterile fashion and timeout was performed again.  The old incision was opened.  The seroma cavity was evacuated.  The lumpectomy cavity was excised and all margins were reexcised oriented and inked.  This was sent to pathology.  The cavity was made hemostatic with cautery.  Local anesthetic was infiltrated consisting of 0.25% Sensorcaine.  It was then closed in layers with a deep layer 3-0 Vicryl and a 4-0 Monocryl.  Dermabond applied.  All final counts found to be correct.  The patient was awoke extubated taken to recovery in satisfactory condition.

## 2018-06-05 NOTE — Anesthesia Preprocedure Evaluation (Signed)
Anesthesia Evaluation  Patient identified by MRN, date of birth, ID band Patient awake    Reviewed: Allergy & Precautions, NPO status , Patient's Chart, lab work & pertinent test results, reviewed documented beta blocker date and time   History of Anesthesia Complications (+) PONV and history of anesthetic complications  Airway Mallampati: II  TM Distance: >3 FB Neck ROM: Full    Dental no notable dental hx. (+) Dental Advisory Given   Pulmonary shortness of breath and with exertion, Current Smoker,    Pulmonary exam normal breath sounds clear to auscultation       Cardiovascular hypertension, Pt. on medications and Pt. on home beta blockers negative cardio ROS Normal cardiovascular exam Rhythm:Regular Rate:Normal  TTE 2016 EF 55-60%, no valvular abnormalities  Stress Test 2016 1. No reversible ischemia or infarction. 2. Normal left ventricular wall motion. 3. Left ventricular ejection fraction 47% 4. Low-risk stress test findings*.   Neuro/Psych negative neurological ROS  negative psych ROS   GI/Hepatic negative GI ROS, Neg liver ROS,   Endo/Other  Hyperthyroidism   Renal/GU Renal InsufficiencyRenal disease  negative genitourinary   Musculoskeletal  (+) Arthritis , Osteoarthritis,    Abdominal   Peds  Hematology negative hematology ROS (+)   Anesthesia Other Findings Right breast cancer  Reproductive/Obstetrics                             Anesthesia Physical  Anesthesia Plan  ASA: III  Anesthesia Plan: General   Post-op Pain Management:  Regional for Post-op pain and GA combined w/ Regional for post-op pain   Induction: Intravenous  PONV Risk Score and Plan: 3 and Ondansetron, Dexamethasone, Midazolam and Treatment may vary due to age or medical condition  Airway Management Planned: LMA  Additional Equipment:   Intra-op Plan:   Post-operative Plan: Extubation in  OR  Informed Consent: I have reviewed the patients History and Physical, chart, labs and discussed the procedure including the risks, benefits and alternatives for the proposed anesthesia with the patient or authorized representative who has indicated his/her understanding and acceptance.     Dental advisory given  Plan Discussed with: CRNA, Anesthesiologist and Surgeon  Anesthesia Plan Comments:         Anesthesia Quick Evaluation

## 2018-06-05 NOTE — Transfer of Care (Signed)
Immediate Anesthesia Transfer of Care Note  Patient: Catherine Lowery  Procedure(s) Performed: RE-EXCISION OF RIGHT BREAST LUMPECTOMY (Right Breast)  Patient Location: PACU  Anesthesia Type:GA combined with regional for post-op pain  Level of Consciousness: awake, alert , oriented and drowsy  Airway & Oxygen Therapy: Patient Spontanous Breathing and Patient connected to face mask oxygen  Post-op Assessment: Report given to RN and Post -op Vital signs reviewed and stable  Post vital signs: Reviewed and stable  Last Vitals:  Vitals Value Taken Time  BP    Temp    Pulse 57 06/05/2018 12:29 PM  Resp 11 06/05/2018 12:29 PM  SpO2 100 % 06/05/2018 12:29 PM  Vitals shown include unvalidated device data.  Last Pain:  Vitals:   06/05/18 1020  TempSrc: Oral  PainSc: 0-No pain         Complications: No apparent anesthesia complications

## 2018-06-05 NOTE — Discharge Instructions (Signed)
[place] Office Phone Number [phone]  BREAST BIOPSY/ LUMPECTOMY: POST OP INSTRUCTIONS  Always review your discharge instruction sheet given to you by the facility where your surgery was performed.  IF YOU HAVE DISABILITY OR FAMILY LEAVE FORMS, YOU MUST BRING THEM TO THE OFFICE FOR PROCESSING.  DO NOT GIVE THEM TO YOUR DOCTOR.  1. A prescription for pain medication may be given to you upon discharge.  Take your pain medication as prescribed, if needed.  If narcotic pain medicine is not needed, then you may take acetaminophen (Tylenol) or ibuprofen (Advil) as needed. No Ibuprofen until 4:30pm 2. Take your usually prescribed medications unless otherwise directed 3. If you need a refill on your pain medication, please contact your pharmacy.  They will contact our office to request authorization.  Prescriptions will not be filled after 5pm or on week-ends. 4. You should eat very light the first 24 hours after surgery, such as soup, crackers, pudding, etc.  Resume your normal diet the day after surgery. 5. Most patients will experience some swelling and bruising in the breast.  Ice packs and a good support bra will help.  Swelling and bruising can take several days to resolve.  6. It is common to experience some constipation if taking pain medication after surgery.  Increasing fluid intake and taking a stool softener will usually help or prevent this problem from occurring.  A mild laxative (Milk of Magnesia or Miralax) should be taken according to package directions if there are no bowel movements after 48 hours. 7. Unless discharge instructions indicate otherwise, you may remove your bandages 24-48 hours after surgery, and you may shower at that time.  You may have steri-strips (small skin tapes) in place directly over the incision.  These strips should be left on the skin for 7-10 days.  If your surgeon used skin glue on the incision, you may shower in 24 hours.  The glue will  flake off over the next 2-3 weeks.  Any sutures or staples will be removed at the office during your follow-up visit. 8. ACTIVITIES:  You may resume regular daily activities (gradually increasing) beginning the next day.  Wearing a good support bra or sports bra minimizes pain and swelling.  You may have sexual intercourse when it is comfortable. a. You may drive when you no longer are taking prescription pain medication, you can comfortably wear a seatbelt, and you can safely maneuver your car and apply brakes. b. RETURN TO WORK:  ______________________________________________________________________________________ 9. You should see your doctor in the office for a follow-up appointment approximately two weeks after your surgery.  Your doctors nurse will typically make your follow-up appointment when she calls you with your pathology report.  Expect your pathology report 2-3 business days after your surgery.  You may call to check if you do not hear from [place] after three days. 10. OTHER INSTRUCTIONS: _______________________________________________________________________________________________ _____________________________________________________________________________________________________________________________________ _____________________________________________________________________________________________________________________________________ _____________________________________________________________________________________________________________________________________  WHEN TO CALL YOUR DOCTOR: 1. Fever over 101.0 2. Nausea and/or vomiting. 3. Extreme swelling or bruising. 4. Continued bleeding from incision. 5. Increased pain, redness, or drainage from the incision.  The clinic staff is available to answer your questions during regular business hours.  Please dont hesitate to call and ask to speak to one of the nurses for clinical concerns.  If you have a medical emergency, go  to the nearest emergency room or call 911.  A surgeon from [facility] is always on call at the hospital.  For further questions, please  visit [email or web address]    Post Anesthesia Home Care Instructions  Activity: Get plenty of rest for the remainder of the day. A responsible individual must stay with you for 24 hours following the procedure.  For the next 24 hours, DO NOT: -Drive a car -Paediatric nurse -Drink alcoholic beverages -Take any medication unless instructed by your physician -Make any legal decisions or sign important papers.  Meals: Start with liquid foods such as gelatin or soup. Progress to regular foods as tolerated. Avoid greasy, spicy, heavy foods. If nausea and/or vomiting occur, drink only clear liquids until the nausea and/or vomiting subsides. Call your physician if vomiting continues.  Special Instructions/Symptoms: Your throat may feel dry or sore from the anesthesia or the breathing tube placed in your throat during surgery. If this causes discomfort, gargle with warm salt water. The discomfort should disappear within 24 hours.  If you had a scopolamine patch placed behind your ear for the management of post- operative nausea and/or vomiting:  1. The medication in the patch is effective for 72 hours, after which it should be removed.  Wrap patch in a tissue and discard in the trash. Wash hands thoroughly with soap and water. 2. You may remove the patch earlier than 72 hours if you experience unpleasant side effects which may include dry mouth, dizziness or visual disturbances. 3. Avoid touching the patch. Wash your hands with soap and water after contact with the patch.

## 2018-06-05 NOTE — Progress Notes (Signed)
Assisted Dr. Oddono with right, ultrasound guided, pectoralis block. Side rails up, monitors on throughout procedure. See vital signs in flow sheet. Tolerated Procedure well. 

## 2018-06-05 NOTE — Anesthesia Postprocedure Evaluation (Signed)
Anesthesia Post Note  Patient: Catherine Lowery  Procedure(s) Performed: RE-EXCISION OF RIGHT BREAST LUMPECTOMY (Right Breast)     Patient location during evaluation: PACU Anesthesia Type: General Level of consciousness: awake and alert Pain management: pain level controlled Vital Signs Assessment: post-procedure vital signs reviewed and stable Respiratory status: spontaneous breathing, nonlabored ventilation, respiratory function stable and patient connected to nasal cannula oxygen Cardiovascular status: blood pressure returned to baseline and stable Postop Assessment: no apparent nausea or vomiting Anesthetic complications: no    Last Vitals:  Vitals:   06/05/18 1230 06/05/18 1245  BP: 116/60 100/61  Pulse: (!) 53 (!) 59  Resp: 13 15  Temp:    SpO2: 100% 100%    Last Pain:  Vitals:   06/05/18 1245  TempSrc:   PainSc: 0-No pain                 Marisabel Macpherson

## 2018-06-05 NOTE — Interval H&P Note (Signed)
History and Physical Interval Note:  06/05/2018 10:53 AM  Catherine Lowery  has presented today for surgery, with the diagnosis of RIGHT BREAST CANCER  The various methods of treatment have been discussed with the patient and family. After consideration of risks, benefits and other options for treatment, the patient has consented to  Procedure(s): RE-EXCISION OF RIGHT BREAST LUMPECTOMY (Right) POSSIBLE AXILLARY LYMPH NODE DISSECTION (Right) as a surgical intervention .  The patient's history has been reviewed, patient examined, no change in status, stable for surgery.  I have reviewed the patient's chart and labs.  Questions were answered to the patient's satisfaction.     [name]

## 2018-06-05 NOTE — Anesthesia Procedure Notes (Signed)
Anesthesia Regional Block: Pectoralis block   Pre-Anesthetic Checklist: ,, timeout performed, Correct Patient, Correct Site, Correct Laterality, Correct Procedure, Correct Position, site marked, Risks and benefits discussed,  Surgical consent,  Pre-op evaluation,  At surgeon's request and post-op pain management  Laterality: Right  Prep: chloraprep       Needles:  Injection technique: Single-shot  Needle Type: Echogenic Stimulator Needle     Needle Length: 5cm  Needle Gauge: 22     Additional Needles:   Procedures:, nerve stimulator,,, ultrasound used (permanent image in chart),,,,  Narrative:  Start time: 06/05/2018 10:45 AM End time: 06/05/2018 10:50 AM Injection made incrementally with aspirations every 5 mL.  Performed by: Personally  Anesthesiologist: Janeece Riggers, MD  Additional Notes: Functioning IV was confirmed and monitors were applied.  A 36mm 22ga Arrow echogenic stimulator needle was used. Sterile prep and drape,hand hygiene and sterile gloves were used. Ultrasound guidance: relevant anatomy identified, needle position confirmed, local anesthetic spread visualized around nerve(s)., vascular puncture avoided.  Image printed for medical record. Negative aspiration and negative test dose prior to incremental administration of local anesthetic. The patient tolerated the procedure well.

## 2018-06-06 ENCOUNTER — Encounter (HOSPITAL_BASED_OUTPATIENT_CLINIC_OR_DEPARTMENT_OTHER): Payer: Self-pay | Admitting: Surgery

## 2018-06-17 NOTE — Progress Notes (Signed)
Location of Breast Cancer: RIGHT breast lower inner quadrant  Histology per Pathology Report: 03/18/18:  Diagnosis Breast, right, needle core biopsy, 5 o'clock, 5cmfn - INVASIVE MAMMARY CARCINOMA, GRADE 2-3.  Receptor Status: ER(95%), PR (95%), Her2-neu (negative), Ki-(5%)  Did patient present with symptoms (if so, please note symptoms) or was this found on screening mammography?: screening mammogram which showed a 2 cm area of architectural distortion right breast lower inner quadrant.  Past/Anticipated interventions by surgeon, if any:  06/05/18: Procedure: Reexcision right breast lumpectomy  Surgeon: [name], MD    05/07/18:  Procedure: Right breast seed localized lumpectomy with right axillary sentinel lymph node mapping deep  Surgeon: [name] M.D  Past/Anticipated interventions by medical oncology, if any: Chemotherapy Per Dr. Jana Hakim 05/23/18:  PLAN: Jalexa did well with her surgery and I spent a good part of today's visit reviewing the pathology.  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  As a result the margins are more frequently positive.  This will be resolved hopefully when she has her next surgery scheduled for 06/05/2018  We also reviewed her genetics testing and she understands variants of uncertain significance are mostly eventually proven to be benign.  She does not carry a negative or bad mutation as far as we can tell  We do not have the MammaPrint results yet although those were sent on 05/13/2018.  She understands that if the MammaPrint comes low risk then she will proceed, after margin surgery, to radiation, and then return to see me to start antiestrogens  If the MammaPrint is high risk however she would need a port and we would proceed to chemotherapy, most likely CMF, after the margin surgery and before the radiation.  She would then again have the antiestrogens at the  She  has a good prognosis either way assuming she follows standard procedure as just outlined, with more than 80% of stage II patients alive and cancer free 5 years after definitive surgery  I spent approximately 30 minutes face to face with Meris with more than 50% of that time spent in counseling and coordination of care.  We will call her with the MammaPrint results and make the appropriate follow-up appointment as soon as we have the data   Lymphedema issues, if any:  No  Pain issues, if any:  Pt denies c/o pain.  SAFETY ISSUES:  Prior radiation? No  Pacemaker/ICD? No  Possible current pregnancy? No, last menstrual period was 01/24/2000  Is the patient on methotrexate? No  Current Complaints / other details:  Pt presents today for f/u new with Dr. Sondra Come for Radiation Oncology. Pt is accompanied by sister Barbaraann Share and sister Neoma Laming.   BP (!) 116/56 (BP Location: Right Arm, Patient Position: Sitting)   Pulse (!) 57   Temp 98.1 F (36.7 C) (Oral)   Resp 18   Ht 5' 9.5" (1.765 m)   Wt 176 lb (79.8 kg)   LMP 01/24/2000   BMI 25.62 kg/m   Wt Readings from Last 3 Encounters:  06/19/18 176 lb (79.8 kg)  06/05/18 173 lb 15.1 oz (78.9 kg)  05/23/18 174 lb 8 oz (79.2 kg)       [name], RN 06/19/2018,8:39 AM  ADVANCED DIRECTIVES: Her sister, Adria Dill, is Katiria's medical power of attorney. Barbaraann Share can be reached at [phone]. [place] has stated that her husband, Lenard Lance, is not to have access to her medical records or  to make decisions on her behalf. ]

## 2018-06-19 ENCOUNTER — Ambulatory Visit: Admission: RE | Admit: 2018-06-19 | Payer: Medicare HMO | Source: Ambulatory Visit | Admitting: Radiation Oncology

## 2018-06-19 ENCOUNTER — Encounter: Payer: Self-pay | Admitting: Radiation Oncology

## 2018-06-19 ENCOUNTER — Ambulatory Visit
Admission: RE | Admit: 2018-06-19 | Discharge: 2018-06-19 | Disposition: A | Discharge status: Home / Self Care | Payer: Medicare HMO | Source: Ambulatory Visit | Attending: Radiation Oncology | Admitting: Radiation Oncology

## 2018-06-19 ENCOUNTER — Other Ambulatory Visit: Payer: Self-pay

## 2018-06-19 VITALS — BP 116/56 | HR 57 | Temp 98.1°F | Resp 18 | Ht 69.5 in | Wt 176.0 lb

## 2018-06-19 DIAGNOSIS — F17210 Nicotine dependence, cigarettes, uncomplicated: Secondary | ICD-10-CM | POA: Insufficient documentation

## 2018-06-19 DIAGNOSIS — Z170 Estrogen receptor positive status [ER+]: Secondary | ICD-10-CM | POA: Diagnosis not present

## 2018-06-19 DIAGNOSIS — C50311 Malignant neoplasm of lower-inner quadrant of right female breast: Secondary | ICD-10-CM

## 2018-06-19 NOTE — Progress Notes (Signed)
Radiation Oncology         [phone] ________________________________  Name: Catherine Lowery MRN: [id number]  Date: 06/19/2018  DOB: Jul 20, 1945  Reevaluation Visit Note  CC: Alycia Rossetti, MD  Magrinat, Virgie Dad, MD    ICD-10-CM   1. Malignant neoplasm of lower-inner quadrant of right breast of female, estrogen receptor positive ([place]) C50.311    Z17.0     Diagnosis:   Prognostic Stage pT2, pN78m, Invasive Lobular Carcinoma of Right LIQ, ER+ / PR+ / Her-2 neg, Grade 2  Narrative:  The patient returns today for routine follow-up.  she is doing well overall. They are accompanied by two female family members. She was initially seen in December 2019 prior to lumpectomy surgery. She was interested in pursuing radiation as a treatment option then, but had to undergo reexcision lumpectomy two weeks ago on February 5 which pushed scheduling back.  Since they were last seen in the office, she had right lumpectomy on January 7 with Dr. CBrantley Stage Pathology showed 1. Breast, lumpectomy, Right w/seed - invasive lobular carcinoma, grade 2, 2.8 cm. Lobular carcinoma in situ. Carcinoma was found to involve the inferior margin at multiple foci and was less than 1 mm from lateral, medial, posterior and inferior margins at multiple other foci. Lymphovascular or perineural invasion is not identified. 2. Lymph node, sentinel, biopsy, Right Axillary - metastatic carcinoma to a lymph node (1/1). The focus of metastatic carcinoma measures 1.5 mm. 3. Lymph node, sentinel, biopsy, Right - isolated tumor cells present (0/1). 4. Lymph node, sentinel, biopsy, Right - negative for carcinoma in a lymph node (0/1).  Prognostic indicators significant for: ER, 95% positive and PR, 95% positive, both with strong staining intensity. Proliferation marker Ki67 at 5%. HER2 negative.       She had right breast excision surgery on February 5 which showed: 1. Breast, excision, Right Anterior Margin - benign breast parenchyma  with previous procedure-related changes, negative for carcinoma. 2. Breast, excision, Right Posterior Margin - invasive lobular carcinoma, grade II. Carcinoma 2 mm from the new inked margin. Previous procedure-related changes. 3. Breast, excision, Right Medial Margin - benign breast parenchyma with previous procedure-related changes. Negative for carcinoma. 4. Breast, excision, Right Lateral Margin - benign breast parenchyma with previous procedure-related changes, negative for carcinoma. 5. Breast, excision, Right Superior Margin - benign breast parenchyma with previous procedure-related changes, negative for carcinoma. 6. Breast, excision, Right Inferior Margin - invasive lobular carcinoma, grade II. Lobular carcinoma, grade II. Lobular carcinoma in situ. Carcinoma is 1 mm from the new inked margin.   On review of systems, she denies arm numbness/swelling, pain and any other symptoms. Pertinent positives are listed and detailed within the above HPI.                 ALLERGIES:  is allergic to morphine and related; other; and percocet [oxycodone-acetaminophen].  Meds: Current Outpatient Medications  Medication Sig Dispense Refill  . aspirin 325 MG tablet Take 325 mg by mouth daily. Stop date 01/30/16    . atorvastatin (LIPITOR) 40 MG tablet Take 1 tablet (40 mg total) by mouth daily. 90 tablet 3  . Cholecalciferol (VITAMIN D3) 2000 units TABS Take 1 tablet by mouth daily. 30 tablet 11  . hydrochlorothiazide (MICROZIDE) 12.5 MG capsule Take 1 capsule (12.5 mg total) by mouth every morning. 90 capsule 2  . ibuprofen (ADVIL,MOTRIN) 800 MG tablet Take 1 tablet (800 mg total) by mouth every 8 (eight) hours as needed. 30 tablet 0  .  Omega-3 Fatty Acids (FISH OIL) 1000 MG CPDR Take by mouth.    . primidone (MYSOLINE) 50 MG tablet Take 2 tablets (100 mg total) by mouth every morning. 180 tablet 4  . propranolol (INDERAL) 40 MG tablet Take 1 tablet (40 mg total) by mouth 2 (two) times daily. 180 tablet 1  .  VOLTAREN 1 % GEL APPLY TOPICALLY 4 TIMES DAILY AS NEEDED 100 g 0   No current facility-administered medications for this encounter.     Physical Findings: The patient is in no acute distress. Patient is alert and oriented.  height is 5' 9.5" (1.765 m) and weight is 176 lb (79.8 kg). Her oral temperature is 98.1 F (36.7 C). Her blood pressure is 116/56 (abnormal) and her pulse is 57 (abnormal). Her respiration is 18. .  No significant changes. Lungs are clear to auscultation bilaterally. Heart has regular rate and rhythm. No palpable cervical, supraclavicular, or axillary adenopathy. Abdomen soft, non-tender, normal bowel sounds. Left breast with no palpable mass, nipple discharge, or bleeding. Right breast with a scar in the lower inner aspect which is healing well without signs of drainage or infection. Some swelling noted in the breast. Separate scar in the right axillary area which has healed well.     Lab Findings: Lab Results  Component Value Date   WBC 4.5 05/03/2018   HGB 11.4 (L) 05/03/2018   HCT 36.5 05/03/2018   MCV 71.4 (L) 05/03/2018   PLT 183 05/03/2018    Radiographic Findings: No results found.  Impression:   Stage pT2, pN62m, Invasive Lobular Carcinoma grade 2 presenting in the lower inner quadrant of the right breast  Patient would be a good candidate for breast conservation with radiation therapy directed at the right breast. I would also recommend elective coverage of the axillary region at the same time as her breast radiation treatments given the positive lymph node.   Today, I talked to the patient and family about the findings and work-up thus far.  We discussed the natural history of her cancer and general treatment, highlighting the role of radiotherapy in the management.  We discussed the available radiation techniques, and focused on the details of logistics and delivery.  We reviewed the anticipated acute and late sequelae associated with radiation in this  setting.  The patient was encouraged to ask questions that I answered to the best of my ability.  A patient consent form was discussed and signed.  We retained a copy for our records.  The patient would like to proceed with radiation and will be scheduled for CT simulation.   Plan:  Pt will return in 2 weeks on March 4 for simulation with treatments to begin 5 weeks post-op. The patient will receive approximately 6 weeks of radiation therapy. Hypofractionated radiation therapy not recommended since we will be covering the axillary region.  ____________________________________   JBlair Promise PhD, MD    This document serves as a record of services personally performed by JGery Pray MD. It was created on his behalf by Mary-Margaret CLoma Messing a trained medical scribe. The creation of this record is based on the scribe's personal observations and the provider's statements to them. This document has been checked and approved by the attending provider.

## 2018-07-02 NOTE — Progress Notes (Signed)
  Radiation Oncology         [phone] ________________________________  Name: BANITA LEHN MRN: [id number]  Date: 07/03/2018  DOB: 09/08/1945  SIMULATION AND TREATMENT PLANNING NOTE    ICD-10-CM   1. Malignant neoplasm of lower-inner quadrant of right breast of female, estrogen receptor positive ([place]) C50.311    Z17.0     DIAGNOSIS:  Stage pT2, pN57m, Invasive Lobular Carcinoma of Right breast LIQ, ER+ / PR+ / Her-2 neg, Grade 2  NARRATIVE:  The patient was brought to the [facility]  Identity was confirmed.  All relevant records and images related to the planned course of therapy were reviewed.  The patient freely provided informed written consent to proceed with treatment after reviewing the details related to the planned course of therapy. The consent form was witnessed and verified by the simulation staff.  Then, the patient was set-up in a stable reproducible  supine position for radiation therapy.  CT images were obtained.  Surface markings were placed.  The CT images were loaded into the planning software.  Then the target and avoidance structures were contoured.  Treatment planning then occurred.  The radiation prescription was entered and confirmed.  Then, I designed and supervised the construction of a total of 5 medically necessary complex treatment devices.  I have requested : 3D Simulation  I have requested a DVH of the following structures: lumpectomy cavity, heart, lungs.  I have ordered:CBC  PLAN:  The patient will receive 50.4 Gy in 28 fractions directed to the right breast. the axillary area will receive 45 gray in 25 fractions. The patient will then proceed with a boost to the lumpectomy cavity of 12 gray in 6 fractions.  -----------------------------------  JBlair Promise PhD, MD  This document serves as a record of services personally performed by JGery Pray MD. It was created on his behalf by KWilburn Mylar a trained medical scribe. The  creation of this record is based on the scribe's personal observations and the provider's statements to them. This document has been checked and approved by the attending provider.

## 2018-07-03 ENCOUNTER — Ambulatory Visit
Admission: RE | Admit: 2018-07-03 | Discharge: 2018-07-03 | Disposition: A | Discharge status: Home / Self Care | Payer: Medicare HMO | Source: Ambulatory Visit | Attending: Radiation Oncology | Admitting: Radiation Oncology

## 2018-07-03 DIAGNOSIS — C50311 Malignant neoplasm of lower-inner quadrant of right female breast: Secondary | ICD-10-CM

## 2018-07-03 DIAGNOSIS — Z170 Estrogen receptor positive status [ER+]: Secondary | ICD-10-CM | POA: Insufficient documentation

## 2018-07-03 DIAGNOSIS — F17210 Nicotine dependence, cigarettes, uncomplicated: Secondary | ICD-10-CM | POA: Insufficient documentation

## 2018-07-09 ENCOUNTER — Telehealth: Payer: Self-pay | Admitting: Oncology

## 2018-07-09 DIAGNOSIS — C50311 Malignant neoplasm of lower-inner quadrant of right female breast: Secondary | ICD-10-CM | POA: Diagnosis not present

## 2018-07-09 NOTE — Telephone Encounter (Signed)
Scheduled appt per 3/10 sch message - pt is aware and reminder letter sent in the mail.

## 2018-07-10 ENCOUNTER — Other Ambulatory Visit: Payer: Self-pay

## 2018-07-10 ENCOUNTER — Ambulatory Visit
Admission: RE | Admit: 2018-07-10 | Discharge: 2018-07-10 | Disposition: A | Discharge status: Home / Self Care | Payer: Medicare HMO | Source: Ambulatory Visit | Attending: Radiation Oncology | Admitting: Radiation Oncology

## 2018-07-10 DIAGNOSIS — Z170 Estrogen receptor positive status [ER+]: Principal | ICD-10-CM

## 2018-07-10 DIAGNOSIS — C50311 Malignant neoplasm of lower-inner quadrant of right female breast: Secondary | ICD-10-CM

## 2018-07-10 NOTE — Progress Notes (Signed)
  Radiation Oncology         [phone] ________________________________  Name: MECHELL GIRGIS MRN: [id number]  Date: 07/10/2018  DOB: Jun 23, 1945  Simulation Verification Note    ICD-10-CM   1. Malignant neoplasm of lower-inner quadrant of right breast of female, estrogen receptor positive ([place]) C50.311    Z17.0     Status: outpatient  NARRATIVE: The patient was brought to the treatment unit and placed in the planned treatment position. The clinical setup was verified. Then port films were obtained and uploaded to the radiation oncology medical record software.  The treatment beams were carefully compared against the planned radiation fields. The position location and shape of the radiation fields was reviewed. They targeted volume of tissue appears to be appropriately covered by the radiation beams. Organs at risk appear to be excluded as planned.  Based on my personal review, I approved the simulation verification. The patient's treatment will proceed as planned.  -----------------------------------  Blair Promise, PhD, MD

## 2018-07-11 ENCOUNTER — Other Ambulatory Visit: Payer: Self-pay

## 2018-07-11 ENCOUNTER — Ambulatory Visit
Admission: RE | Admit: 2018-07-11 | Discharge: 2018-07-11 | Disposition: A | Discharge status: Home / Self Care | Payer: Medicare HMO | Source: Ambulatory Visit | Attending: Radiation Oncology | Admitting: Radiation Oncology

## 2018-07-11 DIAGNOSIS — C50311 Malignant neoplasm of lower-inner quadrant of right female breast: Secondary | ICD-10-CM | POA: Diagnosis not present

## 2018-07-12 ENCOUNTER — Ambulatory Visit
Admission: RE | Admit: 2018-07-12 | Discharge: 2018-07-12 | Disposition: A | Discharge status: Home / Self Care | Payer: Medicare HMO | Source: Ambulatory Visit | Attending: Radiation Oncology | Admitting: Radiation Oncology

## 2018-07-12 ENCOUNTER — Other Ambulatory Visit: Payer: Self-pay

## 2018-07-12 DIAGNOSIS — C50311 Malignant neoplasm of lower-inner quadrant of right female breast: Secondary | ICD-10-CM | POA: Diagnosis not present

## 2018-07-15 ENCOUNTER — Other Ambulatory Visit: Payer: Self-pay | Admitting: Family Medicine

## 2018-07-15 ENCOUNTER — Ambulatory Visit
Admission: RE | Admit: 2018-07-15 | Discharge: 2018-07-15 | Disposition: A | Discharge status: Home / Self Care | Payer: Medicare HMO | Source: Ambulatory Visit | Attending: Radiation Oncology | Admitting: Radiation Oncology

## 2018-07-15 DIAGNOSIS — C50311 Malignant neoplasm of lower-inner quadrant of right female breast: Secondary | ICD-10-CM | POA: Diagnosis not present

## 2018-07-16 ENCOUNTER — Other Ambulatory Visit: Payer: Self-pay

## 2018-07-16 ENCOUNTER — Ambulatory Visit
Admission: RE | Admit: 2018-07-16 | Discharge: 2018-07-16 | Disposition: A | Discharge status: Home / Self Care | Payer: Medicare HMO | Source: Ambulatory Visit | Attending: Radiation Oncology | Admitting: Radiation Oncology

## 2018-07-16 DIAGNOSIS — C50311 Malignant neoplasm of lower-inner quadrant of right female breast: Secondary | ICD-10-CM

## 2018-07-16 DIAGNOSIS — Z170 Estrogen receptor positive status [ER+]: Principal | ICD-10-CM

## 2018-07-16 MED ORDER — RADIAPLEXRX EX GEL
Freq: Once | CUTANEOUS | Status: AC
  Administered 2018-07-16: 10:00:00 via TOPICAL

## 2018-07-16 MED ORDER — ALRA NON-METALLIC DEODORANT (RAD-ONC)
1.0000 "application " | Freq: Once | TOPICAL | Status: AC
  Administered 2018-07-16: 1 via TOPICAL

## 2018-07-17 ENCOUNTER — Other Ambulatory Visit: Payer: Self-pay

## 2018-07-17 ENCOUNTER — Ambulatory Visit
Admission: RE | Admit: 2018-07-17 | Discharge: 2018-07-17 | Disposition: A | Discharge status: Home / Self Care | Payer: Medicare HMO | Source: Ambulatory Visit | Attending: Radiation Oncology | Admitting: Radiation Oncology

## 2018-07-17 DIAGNOSIS — C50311 Malignant neoplasm of lower-inner quadrant of right female breast: Secondary | ICD-10-CM | POA: Diagnosis not present

## 2018-07-18 ENCOUNTER — Other Ambulatory Visit: Payer: Self-pay

## 2018-07-18 ENCOUNTER — Ambulatory Visit
Admission: RE | Admit: 2018-07-18 | Discharge: 2018-07-18 | Disposition: A | Discharge status: Home / Self Care | Payer: Medicare HMO | Source: Ambulatory Visit | Attending: Radiation Oncology | Admitting: Radiation Oncology

## 2018-07-18 DIAGNOSIS — C50311 Malignant neoplasm of lower-inner quadrant of right female breast: Secondary | ICD-10-CM | POA: Diagnosis not present

## 2018-07-19 ENCOUNTER — Other Ambulatory Visit: Payer: Self-pay

## 2018-07-19 ENCOUNTER — Ambulatory Visit
Admission: RE | Admit: 2018-07-19 | Discharge: 2018-07-19 | Disposition: A | Discharge status: Home / Self Care | Payer: Medicare HMO | Source: Ambulatory Visit | Attending: Radiation Oncology | Admitting: Radiation Oncology

## 2018-07-19 DIAGNOSIS — C50311 Malignant neoplasm of lower-inner quadrant of right female breast: Secondary | ICD-10-CM | POA: Diagnosis not present

## 2018-07-22 ENCOUNTER — Ambulatory Visit
Admission: RE | Admit: 2018-07-22 | Discharge: 2018-07-22 | Disposition: A | Discharge status: Home / Self Care | Payer: Medicare HMO | Source: Ambulatory Visit | Attending: Radiation Oncology | Admitting: Radiation Oncology

## 2018-07-22 ENCOUNTER — Other Ambulatory Visit: Payer: Self-pay

## 2018-07-22 DIAGNOSIS — C50311 Malignant neoplasm of lower-inner quadrant of right female breast: Secondary | ICD-10-CM | POA: Diagnosis not present

## 2018-07-23 ENCOUNTER — Other Ambulatory Visit: Payer: Self-pay

## 2018-07-23 ENCOUNTER — Ambulatory Visit
Admission: RE | Admit: 2018-07-23 | Discharge: 2018-07-23 | Disposition: A | Discharge status: Home / Self Care | Payer: Medicare HMO | Source: Ambulatory Visit | Attending: Radiation Oncology | Admitting: Radiation Oncology

## 2018-07-23 DIAGNOSIS — C50311 Malignant neoplasm of lower-inner quadrant of right female breast: Secondary | ICD-10-CM | POA: Diagnosis not present

## 2018-07-24 ENCOUNTER — Ambulatory Visit
Admission: RE | Admit: 2018-07-24 | Discharge: 2018-07-24 | Disposition: A | Discharge status: Home / Self Care | Payer: Medicare HMO | Source: Ambulatory Visit | Attending: Radiation Oncology | Admitting: Radiation Oncology

## 2018-07-24 ENCOUNTER — Other Ambulatory Visit: Payer: Self-pay

## 2018-07-24 DIAGNOSIS — C50311 Malignant neoplasm of lower-inner quadrant of right female breast: Secondary | ICD-10-CM | POA: Diagnosis not present

## 2018-07-25 ENCOUNTER — Ambulatory Visit
Admission: RE | Admit: 2018-07-25 | Discharge: 2018-07-25 | Disposition: A | Discharge status: Home / Self Care | Payer: Medicare HMO | Source: Ambulatory Visit | Attending: Radiation Oncology | Admitting: Radiation Oncology

## 2018-07-25 ENCOUNTER — Other Ambulatory Visit: Payer: Self-pay

## 2018-07-25 DIAGNOSIS — C50311 Malignant neoplasm of lower-inner quadrant of right female breast: Secondary | ICD-10-CM | POA: Diagnosis not present

## 2018-07-26 ENCOUNTER — Ambulatory Visit
Admission: RE | Admit: 2018-07-26 | Discharge: 2018-07-26 | Disposition: A | Discharge status: Home / Self Care | Payer: Medicare HMO | Source: Ambulatory Visit | Attending: Radiation Oncology | Admitting: Radiation Oncology

## 2018-07-26 ENCOUNTER — Other Ambulatory Visit: Payer: Self-pay

## 2018-07-26 DIAGNOSIS — C50311 Malignant neoplasm of lower-inner quadrant of right female breast: Secondary | ICD-10-CM | POA: Diagnosis not present

## 2018-07-29 ENCOUNTER — Ambulatory Visit
Admission: RE | Admit: 2018-07-29 | Discharge: 2018-07-29 | Disposition: A | Discharge status: Home / Self Care | Payer: Medicare HMO | Source: Ambulatory Visit | Attending: Radiation Oncology | Admitting: Radiation Oncology

## 2018-07-29 ENCOUNTER — Other Ambulatory Visit: Payer: Self-pay

## 2018-07-29 DIAGNOSIS — C50311 Malignant neoplasm of lower-inner quadrant of right female breast: Secondary | ICD-10-CM | POA: Diagnosis not present

## 2018-07-30 ENCOUNTER — Other Ambulatory Visit: Payer: Self-pay

## 2018-07-30 ENCOUNTER — Ambulatory Visit
Admission: RE | Admit: 2018-07-30 | Discharge: 2018-07-30 | Disposition: A | Discharge status: Home / Self Care | Payer: Medicare HMO | Source: Ambulatory Visit | Attending: Radiation Oncology | Admitting: Radiation Oncology

## 2018-07-30 DIAGNOSIS — C50311 Malignant neoplasm of lower-inner quadrant of right female breast: Secondary | ICD-10-CM | POA: Diagnosis not present

## 2018-07-31 ENCOUNTER — Other Ambulatory Visit: Payer: Self-pay

## 2018-07-31 ENCOUNTER — Ambulatory Visit
Admission: RE | Admit: 2018-07-31 | Discharge: 2018-07-31 | Disposition: A | Discharge status: Home / Self Care | Payer: Medicare HMO | Source: Ambulatory Visit | Attending: Radiation Oncology | Admitting: Radiation Oncology

## 2018-07-31 DIAGNOSIS — Z170 Estrogen receptor positive status [ER+]: Secondary | ICD-10-CM | POA: Insufficient documentation

## 2018-07-31 DIAGNOSIS — C50311 Malignant neoplasm of lower-inner quadrant of right female breast: Secondary | ICD-10-CM | POA: Diagnosis present

## 2018-07-31 DIAGNOSIS — F17210 Nicotine dependence, cigarettes, uncomplicated: Secondary | ICD-10-CM | POA: Diagnosis not present

## 2018-08-01 ENCOUNTER — Ambulatory Visit
Admission: RE | Admit: 2018-08-01 | Discharge: 2018-08-01 | Disposition: A | Discharge status: Home / Self Care | Payer: Medicare HMO | Source: Ambulatory Visit | Attending: Radiation Oncology | Admitting: Radiation Oncology

## 2018-08-01 ENCOUNTER — Other Ambulatory Visit: Payer: Self-pay

## 2018-08-01 DIAGNOSIS — C50311 Malignant neoplasm of lower-inner quadrant of right female breast: Secondary | ICD-10-CM | POA: Diagnosis not present

## 2018-08-02 ENCOUNTER — Other Ambulatory Visit: Payer: Self-pay

## 2018-08-02 ENCOUNTER — Ambulatory Visit
Admission: RE | Admit: 2018-08-02 | Discharge: 2018-08-02 | Disposition: A | Discharge status: Home / Self Care | Payer: Medicare HMO | Source: Ambulatory Visit | Attending: Radiation Oncology | Admitting: Radiation Oncology

## 2018-08-02 DIAGNOSIS — C50311 Malignant neoplasm of lower-inner quadrant of right female breast: Secondary | ICD-10-CM | POA: Diagnosis not present

## 2018-08-05 ENCOUNTER — Other Ambulatory Visit: Payer: Self-pay

## 2018-08-05 ENCOUNTER — Ambulatory Visit
Admission: RE | Admit: 2018-08-05 | Discharge: 2018-08-05 | Disposition: A | Discharge status: Home / Self Care | Payer: Medicare HMO | Source: Ambulatory Visit | Attending: Radiation Oncology | Admitting: Radiation Oncology

## 2018-08-05 DIAGNOSIS — C50311 Malignant neoplasm of lower-inner quadrant of right female breast: Secondary | ICD-10-CM | POA: Diagnosis not present

## 2018-08-06 ENCOUNTER — Ambulatory Visit
Admission: RE | Admit: 2018-08-06 | Discharge: 2018-08-06 | Disposition: A | Discharge status: Home / Self Care | Payer: Medicare HMO | Source: Ambulatory Visit | Attending: Radiation Oncology | Admitting: Radiation Oncology

## 2018-08-06 ENCOUNTER — Other Ambulatory Visit: Payer: Self-pay

## 2018-08-06 DIAGNOSIS — C50311 Malignant neoplasm of lower-inner quadrant of right female breast: Secondary | ICD-10-CM | POA: Diagnosis not present

## 2018-08-07 ENCOUNTER — Other Ambulatory Visit: Payer: Self-pay

## 2018-08-07 ENCOUNTER — Ambulatory Visit
Admission: RE | Admit: 2018-08-07 | Discharge: 2018-08-07 | Disposition: A | Discharge status: Home / Self Care | Payer: Medicare HMO | Source: Ambulatory Visit | Attending: Radiation Oncology | Admitting: Radiation Oncology

## 2018-08-07 DIAGNOSIS — C50311 Malignant neoplasm of lower-inner quadrant of right female breast: Secondary | ICD-10-CM | POA: Diagnosis not present

## 2018-08-08 ENCOUNTER — Ambulatory Visit
Admission: RE | Admit: 2018-08-08 | Discharge: 2018-08-08 | Disposition: A | Discharge status: Home / Self Care | Payer: Medicare HMO | Source: Ambulatory Visit | Attending: Radiation Oncology | Admitting: Radiation Oncology

## 2018-08-08 ENCOUNTER — Other Ambulatory Visit: Payer: Self-pay

## 2018-08-08 DIAGNOSIS — C50311 Malignant neoplasm of lower-inner quadrant of right female breast: Secondary | ICD-10-CM | POA: Diagnosis not present

## 2018-08-09 ENCOUNTER — Ambulatory Visit
Admission: RE | Admit: 2018-08-09 | Discharge: 2018-08-09 | Disposition: A | Discharge status: Home / Self Care | Payer: Medicare HMO | Source: Ambulatory Visit | Attending: Radiation Oncology | Admitting: Radiation Oncology

## 2018-08-09 ENCOUNTER — Other Ambulatory Visit: Payer: Self-pay

## 2018-08-09 ENCOUNTER — Encounter: Payer: Self-pay | Admitting: Licensed Clinical Social Worker

## 2018-08-09 DIAGNOSIS — C50311 Malignant neoplasm of lower-inner quadrant of right female breast: Secondary | ICD-10-CM | POA: Diagnosis not present

## 2018-08-09 NOTE — Progress Notes (Signed)
UPDATE: The [name] c.1419+4C>T (Intronic) VUS has been reclassified to "Likely Benign." The report date is 08/08/2018.

## 2018-08-12 ENCOUNTER — Other Ambulatory Visit: Payer: Self-pay

## 2018-08-12 ENCOUNTER — Ambulatory Visit
Admission: RE | Admit: 2018-08-12 | Discharge: 2018-08-12 | Disposition: A | Discharge status: Home / Self Care | Payer: Medicare HMO | Source: Ambulatory Visit | Attending: Radiation Oncology | Admitting: Radiation Oncology

## 2018-08-12 DIAGNOSIS — C50311 Malignant neoplasm of lower-inner quadrant of right female breast: Secondary | ICD-10-CM | POA: Diagnosis not present

## 2018-08-13 ENCOUNTER — Other Ambulatory Visit: Payer: Self-pay

## 2018-08-13 ENCOUNTER — Ambulatory Visit: Payer: Medicare HMO | Admitting: Radiation Oncology

## 2018-08-13 ENCOUNTER — Ambulatory Visit
Admission: RE | Admit: 2018-08-13 | Discharge: 2018-08-13 | Disposition: A | Discharge status: Home / Self Care | Payer: Medicare HMO | Source: Ambulatory Visit | Attending: Radiation Oncology | Admitting: Radiation Oncology

## 2018-08-13 DIAGNOSIS — C50311 Malignant neoplasm of lower-inner quadrant of right female breast: Secondary | ICD-10-CM | POA: Diagnosis not present

## 2018-08-14 ENCOUNTER — Ambulatory Visit
Admission: RE | Admit: 2018-08-14 | Discharge: 2018-08-14 | Disposition: A | Discharge status: Home / Self Care | Payer: Medicare HMO | Source: Ambulatory Visit | Attending: Radiation Oncology | Admitting: Radiation Oncology

## 2018-08-14 ENCOUNTER — Other Ambulatory Visit: Payer: Self-pay

## 2018-08-14 DIAGNOSIS — C50311 Malignant neoplasm of lower-inner quadrant of right female breast: Secondary | ICD-10-CM | POA: Diagnosis not present

## 2018-08-15 ENCOUNTER — Ambulatory Visit
Admission: RE | Admit: 2018-08-15 | Discharge: 2018-08-15 | Disposition: A | Discharge status: Home / Self Care | Payer: Medicare HMO | Source: Ambulatory Visit | Attending: Radiation Oncology | Admitting: Radiation Oncology

## 2018-08-15 ENCOUNTER — Other Ambulatory Visit: Payer: Self-pay

## 2018-08-15 DIAGNOSIS — C50311 Malignant neoplasm of lower-inner quadrant of right female breast: Secondary | ICD-10-CM | POA: Diagnosis not present

## 2018-08-16 ENCOUNTER — Telehealth: Payer: Self-pay | Admitting: *Deleted

## 2018-08-16 ENCOUNTER — Ambulatory Visit
Admission: RE | Admit: 2018-08-16 | Discharge: 2018-08-16 | Disposition: A | Discharge status: Home / Self Care | Payer: Medicare HMO | Source: Ambulatory Visit | Attending: Radiation Oncology | Admitting: Radiation Oncology

## 2018-08-16 ENCOUNTER — Other Ambulatory Visit: Payer: Self-pay

## 2018-08-16 DIAGNOSIS — C50311 Malignant neoplasm of lower-inner quadrant of right female breast: Secondary | ICD-10-CM | POA: Diagnosis not present

## 2018-08-16 NOTE — Telephone Encounter (Signed)
[employer] left a message stating Dr Jana Hakim had ordered mammoprint and blueprint.   The mammoprint was approved, blueprint denied- deemed " not medically necessary"

## 2018-08-19 ENCOUNTER — Other Ambulatory Visit: Payer: Self-pay

## 2018-08-19 ENCOUNTER — Ambulatory Visit
Admission: RE | Admit: 2018-08-19 | Discharge: 2018-08-19 | Disposition: A | Discharge status: Home / Self Care | Payer: Medicare HMO | Source: Ambulatory Visit | Attending: Radiation Oncology | Admitting: Radiation Oncology

## 2018-08-19 DIAGNOSIS — C50311 Malignant neoplasm of lower-inner quadrant of right female breast: Secondary | ICD-10-CM | POA: Diagnosis not present

## 2018-08-20 ENCOUNTER — Other Ambulatory Visit: Payer: Self-pay

## 2018-08-20 ENCOUNTER — Ambulatory Visit
Admission: RE | Admit: 2018-08-20 | Discharge: 2018-08-20 | Disposition: A | Discharge status: Home / Self Care | Payer: Medicare HMO | Source: Ambulatory Visit | Attending: Radiation Oncology | Admitting: Radiation Oncology

## 2018-08-20 DIAGNOSIS — C50311 Malignant neoplasm of lower-inner quadrant of right female breast: Secondary | ICD-10-CM | POA: Diagnosis not present

## 2018-08-21 ENCOUNTER — Other Ambulatory Visit: Payer: Self-pay

## 2018-08-21 ENCOUNTER — Ambulatory Visit
Admission: RE | Admit: 2018-08-21 | Discharge: 2018-08-21 | Disposition: A | Discharge status: Home / Self Care | Payer: Medicare HMO | Source: Ambulatory Visit | Attending: Radiation Oncology | Admitting: Radiation Oncology

## 2018-08-21 DIAGNOSIS — C50311 Malignant neoplasm of lower-inner quadrant of right female breast: Secondary | ICD-10-CM | POA: Diagnosis not present

## 2018-08-22 ENCOUNTER — Ambulatory Visit
Admission: RE | Admit: 2018-08-22 | Discharge: 2018-08-22 | Disposition: A | Discharge status: Home / Self Care | Payer: Medicare HMO | Source: Ambulatory Visit | Attending: Radiation Oncology | Admitting: Radiation Oncology

## 2018-08-22 ENCOUNTER — Other Ambulatory Visit: Payer: Self-pay

## 2018-08-22 DIAGNOSIS — C50311 Malignant neoplasm of lower-inner quadrant of right female breast: Secondary | ICD-10-CM | POA: Diagnosis not present

## 2018-08-23 ENCOUNTER — Other Ambulatory Visit: Payer: Self-pay

## 2018-08-23 ENCOUNTER — Ambulatory Visit
Admission: RE | Admit: 2018-08-23 | Discharge: 2018-08-23 | Disposition: A | Discharge status: Home / Self Care | Payer: Medicare HMO | Source: Ambulatory Visit | Attending: Radiation Oncology | Admitting: Radiation Oncology

## 2018-08-23 DIAGNOSIS — C50311 Malignant neoplasm of lower-inner quadrant of right female breast: Secondary | ICD-10-CM | POA: Diagnosis not present

## 2018-08-26 ENCOUNTER — Other Ambulatory Visit: Payer: Self-pay

## 2018-08-26 ENCOUNTER — Ambulatory Visit
Admission: RE | Admit: 2018-08-26 | Discharge: 2018-08-26 | Disposition: A | Discharge status: Home / Self Care | Payer: Medicare HMO | Source: Ambulatory Visit | Attending: Radiation Oncology | Admitting: Radiation Oncology

## 2018-08-26 DIAGNOSIS — C50311 Malignant neoplasm of lower-inner quadrant of right female breast: Secondary | ICD-10-CM | POA: Diagnosis not present

## 2018-08-27 ENCOUNTER — Ambulatory Visit
Admission: RE | Admit: 2018-08-27 | Discharge: 2018-08-27 | Disposition: A | Discharge status: Home / Self Care | Payer: Medicare HMO | Source: Ambulatory Visit | Attending: Radiation Oncology | Admitting: Radiation Oncology

## 2018-08-27 ENCOUNTER — Other Ambulatory Visit: Payer: Self-pay

## 2018-08-27 ENCOUNTER — Encounter: Payer: Self-pay | Admitting: *Deleted

## 2018-08-27 DIAGNOSIS — Z170 Estrogen receptor positive status [ER+]: Principal | ICD-10-CM

## 2018-08-27 DIAGNOSIS — C50311 Malignant neoplasm of lower-inner quadrant of right female breast: Secondary | ICD-10-CM | POA: Diagnosis not present

## 2018-08-27 MED ORDER — RADIAPLEXRX EX GEL
Freq: Once | CUTANEOUS | Status: AC
  Administered 2018-08-27: 16:00:00 via TOPICAL

## 2018-08-28 ENCOUNTER — Encounter: Payer: Self-pay | Admitting: Radiation Oncology

## 2018-08-28 NOTE — Progress Notes (Signed)
  Radiation Oncology         [phone]) [phone] ________________________________  Name: Catherine Lowery MRN: [id number]  Date: 08/28/2018  DOB: 16-Dec-1945  End of Treatment Note  Diagnosis:   StagepT2, pN43m,Invasive Lobular Carcinoma of Right breast LIQ, ER+ / PR+ / Her-2 neg, Grade 2     Indication for treatment:  Curative       Radiation treatment dates:   07/11/18-08/27/18   Site/dose:   1. Right breast; 28 fractions of 1.8 Gy for a total of 50.4 Gy           2. Right axilla; 25 fractions of 1.8 Gy for a total of 45 Gy           3. Boost; 6 fractions of 2 Gy for a total of 12 Gy  Beams/energy:  1. 3D Photon; 6X, 10X       2. 3D Photon; 10X, 6X       3. 3D Photon, 6X, 10X  Narrative: The patient tolerated radiation treatment relatively well.     Towards the end of treatment, pt reported mild fatigue. Pt developed hyperpigmentation throughout shoulder, breast, underneath breast, and axilla, mild erythema, and breast swelling. She had some skin breakdown in the inframammary fold, for which she used Neosporin. She reported using her Radiaplex cream as prescribed. Overall the pt was without complaints.   Plan: The patient has completed radiation treatment. The patient will return to radiation oncology clinic for routine followup in one month. I advised them to call or return sooner if they have any questions or concerns related to their recovery or treatment.  -----------------------------------  JBlair Promise PhD, MD  This document serves as a record of services personally performed by JGery Pray MD. It was created on his behalf by Mary-Margaret CLoma Messing a trained medical scribe. The creation of this record is based on the scribe's personal observations and the provider's statements to them. This document has been checked and approved by the attending provider.

## 2018-09-10 ENCOUNTER — Other Ambulatory Visit: Payer: Self-pay

## 2018-09-11 ENCOUNTER — Encounter: Payer: Self-pay | Admitting: Internal Medicine

## 2018-09-11 ENCOUNTER — Ambulatory Visit: Payer: Medicare HMO | Admitting: Internal Medicine

## 2018-09-11 VITALS — BP 110/70 | HR 54 | Temp 98.0°F | Ht 69.0 in | Wt 174.0 lb

## 2018-09-11 DIAGNOSIS — E0520 Thyrotoxicosis with toxic multinodular goiter without thyrotoxic crisis or storm: Secondary | ICD-10-CM | POA: Diagnosis not present

## 2018-09-11 DIAGNOSIS — R001 Bradycardia, unspecified: Secondary | ICD-10-CM | POA: Diagnosis not present

## 2018-09-11 DIAGNOSIS — E042 Nontoxic multinodular goiter: Secondary | ICD-10-CM | POA: Diagnosis not present

## 2018-09-11 LAB — TSH: TSH: 2.87 u[IU]/mL (ref 0.35–4.50)

## 2018-09-11 LAB — T3, FREE: T3, Free: 3.2 pg/mL (ref 2.3–4.2)

## 2018-09-11 LAB — T4, FREE: Free T4: 0.93 ng/dL (ref 0.60–1.60)

## 2018-09-11 NOTE — Progress Notes (Signed)
Patient ID: ELDINE RENCHER, female   DOB: 11/30/1945, 73 y.o.   MRN: [id number]   HPI  SHAKIERA EDELSON is a 73 y.o.-year-old female, initially referred by her cardiologist, Dr. Debara Pickett, returning for f/u for history of toxic MNG, with subclinical thyrotoxicosis, now s/p RAI tx. Last visit 1 year ago.  Since last visit, she was diagnosed with breast cancer in 03/2018. She had partial mastectomy, then more extensive R breast sx. Thn she had RxTx up to 07/2018.   Reviewed and addended history: Pt was found to have a low TSH at the time of a recent hospitalization for CP in 12/2014. She r/o for an AMI. She had a follow up visit with Dr Debara Pickett >> a TSH was repeated >> better, but still low.   Thyroid Uptake and scan (11/30/2015) report reviewed:  The 24 hour radioactive iodine uptake is equal to 21.7%.  Heterogeneous tracer activity is identified within both lobes of the thyroid gland. Relative areas of decreased uptake localizing to the upper pole of the left lobe, mid right lobe and upper pole of right lobe is noted.  IMPRESSION: 1. 24 hour radioactive iodine uptake is within normal limits at 21.7%. 2. Multi nodular thyroid gland.      We next obtained a thyroid U/S (12/09/2015) to investigate the upper "cold" thyroid nodules - however, these were small, while the dominant nodules appeared "hot" >> we did not have to biopsy them: Right thyroid lobe: 5.9 x 2.0 x 2.4 cm. Multiple nodules are scattered throughout the right lobe.  1.3 x 0.7 x 0.9 cm mid lobe solid nodule. 1.7 x 1.3 x 0.8 cm upper pole solid nodule.  1.9 x 0.9 x 1.2 cm lower pole nodule.  Other scattered smaller nodules are noted. Left thyroid lobe: 5.6 x 1.5 x 2.0 cm. Multiple solid nodules are scattered throughout the left lobe.  1.0 x 1.1 x 1.4 cm mid lobe nodule.  2.1 x 0.9 x 1.2 cm lower pole nodule.  1.4 x 1.0 x 1.1 cm upper pole nodule.  Other smaller nodules are noted. Isthmus Thickness: 0.4 cm.  Small  subcentimeter nodules are present. Lymphadenopathy: None visualized.  Pt had RAI tx (02/24/2016): 29.4 mCi    Her thyroid tests normalized after her RAI treatment, without the need of levothyroxine.  Review TFTs: Lab Results  Component Value Date   TSH 2.27 09/10/2017   TSH 2.43 03/13/2017   TSH 1.89 11/10/2016   TSH 1.80 07/04/2016   TSH 0.07 (L) 04/05/2016   TSH 0.04 (L) 11/15/2015   TSH 0.33 (L) 06/30/2015   TSH 0.20 (L) 03/02/2015   TSH 0.198 (L) 02/04/2015   TSH 0.045 (L) 01/12/2015   FREET4 0.76 09/10/2017   FREET4 0.78 03/13/2017   FREET4 0.67 11/10/2016   FREET4 0.76 07/04/2016   FREET4 0.93 04/05/2016   FREET4 0.93 11/15/2015   FREET4 0.85 06/30/2015   FREET4 0.78 03/02/2015   FREET4 0.91 02/04/2015    Her Graves' antibodies were not elevated: Lab Results  Component Value Date   TSI 51 03/02/2015   She has chronic, familial, tremors.  She sees neurology (Dr. Amalia Hailey in [place]).  She is on propranolol (changed from capsule to tablet) and primidone.  Pt denies: - feeling nodules in neck - dysphagia - choking - SOB with lying down  She continues to have hoarseness from smoking.  She is trying to quit smoking. She is now smoking 1/2 PPD daily.  She has a family history of thyroid cancer  in sister. No FH of thyroid cancer. No h/o radiation tx to head or neck.  No herbal supplements. No Biotin use. No recent steroids use.   ROS: Constitutional: no weight gain/no weight loss, no fatigue, no subjective hyperthermia, no subjective hypothermia Eyes: no blurry vision, no xerophthalmia ENT: no sore throat, + see HPI Cardiovascular: no CP/no SOB/no palpitations/+ leg swelling Respiratory: no cough/no SOB/no wheezing Gastrointestinal: no N/no V/no D/no C/no acid reflux Musculoskeletal: no muscle aches/no joint aches Skin: no rashes, no hair loss Neurological: + tremors/no numbness/no tingling/no dizziness  I reviewed pt's medications, allergies, PMH, social  hx, family hx, and changes were documented in the history of present illness. Otherwise, unchanged from my initial visit note.  Past Medical History:  Diagnosis Date  . Cancer ([place]) 03/2018   right breast cancer  . Chronic kidney disease    CKD  . DJD (degenerative joint disease)    LEFT HIP  . Family history of breast cancer   . Family history of ovarian cancer   . Family history of prostate cancer   . Family history of prostate cancer   . Hyperlipidemia    takes Simvasatin daily  . Hypertension    takes Propranolol and HCTZ daioly  . Hyperthyroidism   . Pneumonia 10/2015  . PONV (postoperative nausea and vomiting)    when ether used  . Shortness of breath dyspnea    with ambulation  . Tremors of nervous system    takes Primidone daily   Past Surgical History:  Procedure Laterality Date  . BOne Spur     left heel  . BREAST LUMPECTOMY WITH RADIOACTIVE SEED AND SENTINEL LYMPH NODE BIOPSY Right 05/07/2018   Procedure: RIGHT BREAST LUMPECTOMY WITH RADIOACTIVE SEED AND RIGHT  SENTINEL LYMPH NODE MAPPING;  Surgeon: [name], MD;  Location: [facility];  Service: General;  Laterality: Right;  . COLONOSCOPY    . JOINT REPLACEMENT Bilateral    knee  . KNEE SURGERY    . RE-EXCISION OF BREAST LUMPECTOMY Right 06/05/2018   Procedure: RE-EXCISION OF RIGHT BREAST LUMPECTOMY;  Surgeon: [name], MD;  Location: [facility];  Service: General;  Laterality: Right;  . ROTATOR CUFF REPAIR Bilateral   . 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 TOTAL HIP ARTHROPLASTY Left 12/29/2015   Procedure: TOTAL HIP ARTHROPLASTY ANTERIOR APPROACH;  Surgeon: Ninetta Lights, MD;  Location: [facility];  Service: Orthopedics;  Laterality: Left;   Social History   Social History  . Marital Status: Married    Spouse Name: N/A  . Number of Children: 1   Occupational History   retired   Social History Main Topics  . Smoking status: Current Every Day Smoker -- 0.50  packs/day for 20 years  . Smokeless tobacco: Not on file  . Alcohol Use: No  . Drug Use: No   Current Outpatient Medications on File Prior to Visit  Medication Sig Dispense Refill  . aspirin 325 MG tablet Take 325 mg by mouth daily. Stop date 01/30/16    . atorvastatin (LIPITOR) 40 MG tablet Take 1 tablet (40 mg total) by mouth daily. 90 tablet 3  . Cholecalciferol (VITAMIN D3) 2000 units TABS Take 1 tablet by mouth daily. 30 tablet 11  . hydrochlorothiazide (MICROZIDE) 12.5 MG capsule Take 1 capsule (12.5 mg total) by mouth every morning. 90 capsule 2  . ibuprofen (ADVIL,MOTRIN) 800 MG tablet Take 1 tablet (800 mg total) by mouth every 8 (eight) hours as needed. Alfalfa  tablet 0  . Omega-3 Fatty Acids (FISH OIL) 1000 MG CPDR Take by mouth.    . primidone (MYSOLINE) 50 MG tablet Take 2 tablets (100 mg total) by mouth every morning. 180 tablet 4  . propranolol (INDERAL) 40 MG tablet TAKE 1 TABLET TWICE DAILY 180 tablet 1  . VOLTAREN 1 % GEL APPLY TOPICALLY 4 TIMES DAILY AS NEEDED 100 g 0   No current facility-administered medications on file prior to visit.    Allergies  Allergen Reactions  . Morphine And Related Itching  . Other Itching and Nausea And Vomiting    "PAIN MEDICINES"  . Percocet [Oxycodone-Acetaminophen] Nausea And Vomiting   Family History  Problem Relation Age of Onset  . Hypertension Mother   . Heart disease Mother   . Stroke Mother   . Multiple myeloma Father 39  . Thyroid cancer Sister        dx 40's/50's  . Cancer Paternal Aunt        unknown  . Prostate cancer Paternal Uncle        dx >50- cancer was the cause of his death  . Ovarian cancer Paternal Aunt   . Throat cancer Paternal Uncle   . Breast cancer Cousin        dx >50  . Cancer Cousin        type unk  . Cancer Cousin        type unk  . Cancer Cousin        type unk  + see HPI  PE: BP 110/70   Pulse (!) 54   Temp 98 F (36.7 C)   Ht '5\' 9"'$  (1.753 m)   Wt 174 lb (78.9 kg)   LMP 01/24/2000    SpO2 98%   BMI 25.70 kg/m  Body mass index is 25.7 kg/m. Wt Readings from Last 3 Encounters:  09/11/18 174 lb (78.9 kg)  06/19/18 176 lb (79.8 kg)  06/05/18 173 lb 15.1 oz (78.9 kg)   Constitutional: overweight, in NAD Eyes: PERRLA, EOMI, no exophthalmos ENT: moist mucous membranes, + lumpy bumpy thyroid, no cervical lymphadenopathy Cardiovascular: bradycardia, RR, No MRG, + bilateral LE edema Respiratory: CTA B Gastrointestinal: abdomen soft, NT, ND, BS+ Musculoskeletal: no deformities, strength intact in all 4 Skin: moist, warm, no rashes Neurological: + tremor with outstretched hands, she has bilateral knee replacement-DTRs not checked  ASSESSMENT: 1. Toxic MNG  2. Thyroid nodules  3. Bradycardia  PLAN:  1. 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 -She continues to have no complaints other than tremors, which are chronic.  She has no palpitations, no unintentional weight loss, anxiety, heat intolerance, fatigue, hyper defecation.  We discussed that these are signs of thyrotoxicosis and she will need to let me know if she starts to develop them before her next appointment. -We discussed about the fact that there is still a possibility of becoming hypothyroid after her RAI treatment and needing levothyroxine -She continues on beta-blockers for tremors. -We will check TFTs today -If normal, she can continue to follow with PCP with annual TFTs. -She refuses to try my chart.  We will call her with the results.  2. Thyroid nodules - no neck compression sxs - can be followed clinically   3.  Bradycardia - she is on Propranolol 80 - advised her to discuss with her neurologist to see if she can  decrease the dose - no dizziness, increased HAs or fatigue  Patient Instructions  Please stop at the lab.  Please have a TSH checked every year  by your PCP. Please come back if this becomes abnormal.  Office Visit on 09/11/2018  Component Date Value Ref Range Status  . TSH 09/11/2018 2.87  0.35 - 4.50 uIU/mL Final  . Free T4 09/11/2018 0.93  0.60 - 1.60 ng/dL Final   Comment: Specimens from patients who are undergoing biotin therapy and /or ingesting biotin supplements may contain high levels of biotin.  The higher biotin concentration in these specimens interferes with this Free T4 assay.  Specimens that contain high levels  of biotin may cause false high results for this Free T4 assay.  Please interpret results in light of the total clinical presentation of the patient.    . T3, Free 09/11/2018 3.2  2.3 - 4.2 pg/mL Final   Her thyroid tests remain stable, normal.  [name], MD PhD [facility] Endocrinology

## 2018-09-11 NOTE — Patient Instructions (Signed)
Please stop at the lab.  Please have a TSH checked every year by your PCP. Please come back if this becomes abnormal.

## 2018-09-16 ENCOUNTER — Telehealth: Payer: Self-pay | Admitting: Diagnostic Neuroimaging

## 2018-09-16 NOTE — Telephone Encounter (Signed)
Patient called and stated that her Thyroid Dr. Roney Jaffe she needs to change the dosage on her propanolol. She would like to discuss this with the nurse. Please call and advise.

## 2018-09-16 NOTE — Telephone Encounter (Signed)
Called patient and advised her that she may call Dr Buelah Manis who is now presribing propranolol. Otherwise I'll schedule a video visit with Dr Leta Baptist. He last saw her March 2019.  She stated she would call Dr Buelah Manis. I encouraged her to call back for anything else we may do for her. She  verbalized understanding, appreciation.

## 2018-09-25 ENCOUNTER — Telehealth: Payer: Self-pay

## 2018-09-25 NOTE — Telephone Encounter (Signed)
Contacted about tomorrows appointment she denies any issues with her skin. She asked if she could take multivitamins for fatigue. Reassured that this is fine and that fatigue can take a while to get over.She will call us if she has any issues in the future. Will cancel this appointment. Brandt Loosen RN

## 2018-09-26 ENCOUNTER — Ambulatory Visit: Payer: 59 | Admitting: Radiation Oncology

## 2018-09-30 NOTE — Progress Notes (Signed)
Catherine Lowery  [phone] [phone]     ID: Catherine Lowery DOB: 07-08-1945  MR#: [id number]  DPO#:[id number]  Patient Care Team: Alycia Rossetti, MD as PCP - General (Family Medicine) Debara Pickett [name], MD as PCP - Cardiology (Cardiology) , Virgie Dad, MD as Consulting Physician (Oncology) Gery Pray, MD as Consulting Physician (Radiation Oncology) Renette Butters, MD as Attending Physician (Orthopedic Surgery) [name], MD as Consulting Physician (General Surgery) Milus Banister, MD as Attending Physician (Gastroenterology) Penni Bombard, MD as Consulting Physician (Neurology) Estanislado Emms, MD as Consulting Physician (Nephrology) Mauro Kaufmann, RN as Oncology Nurse Navigator Rockwell Germany, RN as Oncology Nurse Navigator OTHER MD:   CHIEF COMPLAINT: 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   Her MammaPrint results came back on 05/24/2018 showing low risk of recurrence.  She underwent right breast margin re-excision on 06/05/2018. Pathology [id number]) showed invasive lobular carcinoma involved the posterior and inferior margins. All margins were clear following the procedure.  She then proceeded to radiation therapy and was treated from 07/11/2018 - 08/27/2018. She tolerated this relatively well. She reports still feeling tired.   REVIEW OF SYSTEMS: Ludean reports feeling very fatigued. She notes trouble exercising because of how tires she is. She states she eats one meal a day-- a sandwich. She notes normal bowel movements, once a day. Her son, who lives with her, is handicapped, so she does the shopping.   The patient denies unusual headaches, visual changes, nausea, vomiting, stiff neck, dizziness, or gait imbalance. There has  been no cough, phlegm production, or pleurisy, no chest pain or pressure, and no change in bowel or bladder habits. The patient denies fever, rash, bleeding, unexplained fatigue or unexplained weight loss. A detailed review of systems was otherwise entirely negative.   HISTORY OF CURRENT ILLNESS: From the original intake note:  Catherine Lowery had routine screening mammography on 02/26/2018 showing a possible abnormality in the right breast. She underwent unilateral right diagnostic mammography with tomography and right breast ultrasonography at [facility] on 03/07/2018 showing: Breast Density Category B. On mammography, there is a new 1.5 cm irregular architectural distortion with a spiculated margin in the right breast at 5 o'clock anterior depth. On ultrasound, there is a 2.7 cm irregular solid mass in the right breast at 5 o'clock middle depth. This irregular mass displays posterior acoustic shadowing. No significant abnormalities were seen sonographically in the right axilla.  Accordingly on 03/18/2018 she proceeded to biopsy of the right breast area in question. The pathology from this procedure showed ([id number]): Invasive mammary carcinoma e-cadherin negative, grade II-III. 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 Prognostic indicators significant for: estrogen receptor, 95% positive and progesterone receptor, 95% positive, both with strong staining intensity. Proliferation marker Ki67 at 5%. HER2 negative by immunohistochemistry (1+).  On 04/10/2018 the patient underwent bilateral breast MRI with and without contrast showing a 2.3 cm spiculated enhancing mass associated with non-masslike enhancement, the total abnormal area measuring 4.6 cm.  There was no evidence of lymph node abnormality and the contralateral breast was benign appearing  The patient's subsequent history is as detailed below.   PAST MEDICAL HISTORY: Past Medical History:  Diagnosis Date  . Cancer  ([place]) 03/2018   right breast cancer  . Chronic kidney disease    CKD  .  DJD (degenerative joint disease)    LEFT HIP  . Family history of breast cancer   . Family history of ovarian cancer   . Family history of prostate cancer   . Family history of prostate cancer   . Hyperlipidemia    takes Simvasatin daily  . Hypertension    takes Propranolol and HCTZ daioly  . Hyperthyroidism   . Pneumonia 10/2015  . PONV (postoperative nausea and vomiting)    when ether used  . Shortness of breath dyspnea    with ambulation  . Tremors of nervous system    takes Primidone daily    PAST SURGICAL HISTORY: Past Surgical History:  Procedure Laterality Date  . BOne Spur     left heel  . BREAST LUMPECTOMY WITH RADIOACTIVE SEED AND SENTINEL LYMPH NODE BIOPSY Right 05/07/2018   Procedure: RIGHT BREAST LUMPECTOMY WITH RADIOACTIVE SEED AND RIGHT  SENTINEL LYMPH NODE MAPPING;  Surgeon: [name], MD;  Location: [place];  Service: General;  Laterality: Right;  . COLONOSCOPY    . JOINT REPLACEMENT Bilateral    knee  . KNEE SURGERY    . RE-EXCISION OF BREAST LUMPECTOMY Right 06/05/2018   Procedure: RE-EXCISION OF RIGHT BREAST LUMPECTOMY;  Surgeon: [name], MD;  Location: [place];  Service: General;  Laterality: Right;  . ROTATOR CUFF REPAIR Bilateral   . 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 TOTAL HIP ARTHROPLASTY Left 12/29/2015   Procedure: TOTAL HIP ARTHROPLASTY ANTERIOR APPROACH;  Surgeon: Ninetta Lights, MD;  Location: [place];  Service: Orthopedics;  Laterality: Left;     FAMILY HISTORY Family History  Problem Relation Age of Onset  . Hypertension Mother   . Heart disease Mother   . Stroke Mother   . Multiple myeloma Father 41  . Thyroid cancer Sister        dx 40's/50's  . Cancer Paternal Aunt        unknown  . Prostate cancer Paternal Uncle        dx >50- cancer was the cause of his death  . Ovarian cancer Paternal Aunt   . Throat  cancer Paternal Uncle   . Breast cancer Cousin        dx >50  . Cancer Cousin        type unk  . Cancer Cousin        type unk  . Cancer Cousin        type unk   She notes that her father died from a multiple myeloma at age 29. Patients' mother died from stroke at age 15. The patient has 1 brother and 2 sisters. One paternal aunt had ovarian cancer.  One cousin had breast cancer, and her 4s. Another paternal aunt had cancer, type unknown to the patient, and a paternal uncle had prostate cancer.    GYNECOLOGIC HISTORY:  Menarche: 73 years old Age at first live birth: 73 years old GX P: 1 LMP: Patient's last menstrual period was 01/24/2000. 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 Before that, she has been employed at [employer], [employer], and as an elderly caregiver. 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 She is separated, and has not spoken to her husband, Lenard Lance, in over 10 years. She lives with her only son, Marguarite Markov, who is disabled  secondary to SCD and multiple surgeries. She has no pets. She attends [employer].    ADVANCED DIRECTIVES: Her sister, Adria Dill, is Odilia's medical power of attorney. Barbaraann Share can be reached at [phone]. Orellana has stated that her husband, Lenard Lance, is not to have access to her medical records or to make decisions on her behalf. ]   HEALTH MAINTENANCE: Social History   Tobacco Use  . Smoking status: Current Every Day Smoker    Packs/day: 0.50    Years: 20.00    Pack years: 10.00  . Smokeless tobacco: Never Used  . Tobacco comment: 06/06/16 Pt stated "I'm trying to quit"  Substance Use Topics  . Alcohol use: No    Alcohol/week: 0.0 standard drinks  . Drug use: No     Colonoscopy: 01/2015  PAP:   Bone density:  02/26/2018: T-score 1.4   Allergies  Allergen  Reactions  . Morphine And Related Itching  . Other Itching and Nausea And Vomiting    "PAIN MEDICINES"  . Percocet [Oxycodone-Acetaminophen] Nausea And Vomiting    Current Outpatient Medications  Medication Sig Dispense Refill  . aspirin 325 MG tablet Take 325 mg by mouth daily. Stop date 01/30/16    . atorvastatin (LIPITOR) 40 MG tablet Take 1 tablet (40 mg total) by mouth daily. 90 tablet 3  . Cholecalciferol (VITAMIN D3) 2000 units TABS Take 1 tablet by mouth daily. 30 tablet 11  . hydrochlorothiazide (MICROZIDE) 12.5 MG capsule Take 1 capsule (12.5 mg total) by mouth every morning. 90 capsule 2  . ibuprofen (ADVIL,MOTRIN) 800 MG tablet Take 1 tablet (800 mg total) by mouth every 8 (eight) hours as needed. 30 tablet 0  . Omega-3 Fatty Acids (FISH OIL) 1000 MG CPDR Take by mouth.    . primidone (MYSOLINE) 50 MG tablet Take 2 tablets (100 mg total) by mouth every morning. 180 tablet 4  . propranolol (INDERAL) 40 MG tablet TAKE 1 TABLET TWICE DAILY 180 tablet 1  . VOLTAREN 1 % GEL APPLY TOPICALLY 4 TIMES DAILY AS NEEDED 100 g 0   No current facility-administered medications for this visit.      OBJECTIVE: Middle-aged African-American woman in no acute distress  Vitals:   10/01/18 1020  BP: (!) 126/48  Pulse: (!) 48  Resp: 18  Temp: 97.8 F (36.6 C)  SpO2: 100%     Body mass index is 25.77 kg/m.   Wt Readings from Last 3 Encounters:  10/01/18 174 lb 8 oz (79.2 kg)  09/11/18 174 lb (78.9 kg)  06/19/18 176 lb (79.8 kg)      ECOG FS:1 - Symptomatic but completely ambulatory  Sclerae unicteric, pupils round and equal Wearing a mask No cervical or supraclavicular adenopathy Lungs no rales or rhonchi Heart regular rate and rhythm Abd soft, nontender, positive bowel sounds MSK no focal spinal tenderness, no upper extremity lymphedema Neuro: nonfocal, well oriented, appropriate affect Breasts: The right breast is status post lumpectomy and radiation.  The cosmetic result is  excellent.  There is some hyperpigmentation and minimal skin thickening secondary to the radiation.  The left breast is benign.  Both axillae are benign.   LAB RESULTS:  CMP     Component Value Date/Time   NA 142 05/31/2018 1500   K 4.2 05/31/2018 1500   CL 107 05/31/2018 1500   CO2 26 05/31/2018 1500   GLUCOSE 89 05/31/2018 1500   BUN 24 (H) 05/31/2018 1500   CREATININE 1.32 (H) 05/31/2018 1500  CREATININE 1.13 (H) 01/23/2018 0804   CALCIUM 9.4 05/31/2018 1500   PROT 6.9 05/03/2018 1455   ALBUMIN 3.6 05/03/2018 1455   AST 21 05/03/2018 1455   ALT 22 05/03/2018 1455   ALKPHOS 72 05/03/2018 1455   BILITOT 0.5 05/03/2018 1455   GFRNONAA 40 (L) 05/31/2018 1500   GFRNONAA 48 (L) 01/23/2018 0804   GFRAA 46 (L) 05/31/2018 1500   GFRAA 56 (L) 01/23/2018 0804    No results found for: TOTALPROTELP, ALBUMINELP, A1GS, A2GS, BETS, BETA2SER, GAMS, MSPIKE, SPEI  No results found for: KPAFRELGTCHN, LAMBDASER, [facility]  Lab Results  Component Value Date   WBC 4.5 05/03/2018   NEUTROABS 1.2 (L) 05/03/2018   HGB 11.4 (L) 05/03/2018   HCT 36.5 05/03/2018   MCV 71.4 (L) 05/03/2018   PLT 183 05/03/2018    '@LASTCHEMISTRY'$ @  No results found for: LABCA2  No components found for: ZDGUYQ034  No results for input(s): INR in the last 168 hours.  No results found for: LABCA2  No results found for: VQQ595  No results found for: GLO756  No results found for: EPP295  No results found for: CA2729  No components found for: HGQUANT  No results found for: CEA1 / No results found for: CEA1   No results found for: AFPTUMOR  No results found for: CHROMOGRNA  No results found for: PSA1  No visits with results within 3 Day(s) from this visit.  Latest known visit with results is:  Office Visit on 09/11/2018  Component Date Value Ref Range Status  . TSH 09/11/2018 2.87  0.35 - 4.50 uIU/mL Final  . Free T4 09/11/2018 0.93  0.60 - 1.60 ng/dL Final   Comment: Specimens from  patients who are undergoing biotin therapy and /or ingesting biotin supplements may contain high levels of biotin.  The higher biotin concentration in these specimens interferes with this Free T4 assay.  Specimens that contain high levels  of biotin may cause false high results for this Free T4 assay.  Please interpret results in light of the total clinical presentation of the patient.    . T3, Free 09/11/2018 3.2  2.3 - 4.2 pg/mL Final    (this displays the last labs from the last 3 days)  No results found for: TOTALPROTELP, ALBUMINELP, A1GS, A2GS, BETS, BETA2SER, GAMS, MSPIKE, SPEI (this displays SPEP labs)  No results found for: KPAFRELGTCHN, LAMBDASER, KAPLAMBRATIO (kappa/lambda light chains)  No results found for: HGBA, HGBA2QUANT, HGBFQUANT, HGBSQUAN (Hemoglobinopathy evaluation)   No results found for: LDH  No results found for: IRON, TIBC, IRONPCTSAT (Iron and TIBC)  No results found for: FERRITIN  Urinalysis    Component Value Date/Time   COLORURINE YELLOW 09/21/2017 1533   APPEARANCEUR CLOUDY (A) 09/21/2017 1533   LABSPEC 1.015 09/21/2017 1533   PHURINE 5.5 09/21/2017 1533   GLUCOSEU NEGATIVE 09/21/2017 1533   HGBUR TRACE (A) 09/21/2017 1533   BILIRUBINUR SMALL (A) 03/24/2011 1054   KETONESUR NEGATIVE 09/21/2017 1533   PROTEINUR NEGATIVE 09/21/2017 1533   UROBILINOGEN 0.2 03/24/2011 1054   NITRITE NEGATIVE 09/21/2017 1533   LEUKOCYTESUR TRACE (A) 09/21/2017 1533     STUDIES:  No results found.  ELIGIBLE FOR AVAILABLE RESEARCH PROTOCOL: no   ASSESSMENT: 73 y.o. [employer], [place] woman status post right breast lower inner quadrant biopsy 03/18/2018 for a clinical T2N0, [name], grade 2 or 3, estrogen and progesterone receptor positive, [name], with an MIB-1 of 5%  (1) Genetic testing performed through [facility] Common  Hereditary Cancers Panel + Thyroid Cancer Panel on 05/09/2018 showing no deleterious mutations  APC, [name], [name], [name], [name], [name], [name], [name], [name], [name], [name], [name], [name], [name], [name], [name], [name], [name], [name], [name], [name], KIT, MEN1, [name], [name], [name], [name], [name], [name], [name], [name], [name], [name], [name], [name], [name], POLE, [name], [name], RAD50, [name], [name], [name], [name], [name], [name], [name], [name], [name], [name], [name], [name], [name], [name], [name], VHL.   (a) Two variants of uncertain significance in the genes [name] c.1176C>T (Silent) and [name] c.1419+4C>T (Intronic) were identified.  (2) right lumpectomy and sentinel lymph node sampling 05/07/2018 showed a pT2 pN1, [name], grade 2, with a positive inferior margin  (a) reexcision scheduled for 06/05/2018  (3) MammaPrint to be obtained from the definitive surgical sample  (4) adjuvant radiation 07/11/2018 - 08/27/2018  (a) 1. Right breast; [name]; [name]; 6 fractions of 2 Gy for a total of 12 Gy  (5) anastrozole started 10/02/2018   PLAN: Teran has completed local treatment for her breast cancer and is now ready to start systemic therapy.  Given her mammograms she was not a candidate for chemotherapy so her only systemic therapy will be antiestrogens.  Today we discussed anastrozole in detail.  She has a good understanding of the possible toxicities side effects and complications of this agent.  I have gone ahead and placed the prescription in for her.   We also discussed her genetics results which I was glad to clarify for her.  She will be scheduled for a bone density scan with her next set of mammography.   She knows to call for any other issue that may develop before the next visit   , Virgie Dad, MD  10/01/18 10:52 AM Medical Oncology and Hematology [facility] [place], [place]  [address] Tel. [phone]    Fax. [phone]    I, Wilburn Mylar, am acting as scribe for Dr. Virgie Dad. .  I, [name] MD, have reviewed the above documentation for accuracy and completeness, and I agree with the above.

## 2018-10-01 ENCOUNTER — Inpatient Hospital Stay: Payer: Medicare HMO | Attending: Oncology | Admitting: Oncology

## 2018-10-01 ENCOUNTER — Other Ambulatory Visit: Payer: Self-pay

## 2018-10-01 VITALS — BP 126/48 | HR 48 | Temp 97.8°F | Resp 18 | Ht 69.0 in | Wt 174.5 lb

## 2018-10-01 DIAGNOSIS — C50311 Malignant neoplasm of lower-inner quadrant of right female breast: Secondary | ICD-10-CM | POA: Diagnosis not present

## 2018-10-01 DIAGNOSIS — Z720 Tobacco use: Secondary | ICD-10-CM | POA: Insufficient documentation

## 2018-10-01 DIAGNOSIS — R5383 Other fatigue: Secondary | ICD-10-CM

## 2018-10-01 DIAGNOSIS — Z170 Estrogen receptor positive status [ER+]: Secondary | ICD-10-CM | POA: Diagnosis not present

## 2018-10-01 MED ORDER — ANASTROZOLE 1 MG PO TABS: 1.0000 mg | ORAL_TABLET | Freq: Every day | ORAL | 4 refills | Status: DC

## 2018-10-02 ENCOUNTER — Telehealth: Payer: Self-pay | Admitting: Oncology

## 2018-10-02 NOTE — Telephone Encounter (Signed)
Talk with patient regarding schedule °

## 2018-10-03 ENCOUNTER — Encounter: Payer: Self-pay | Admitting: *Deleted

## 2018-10-04 ENCOUNTER — Telehealth: Payer: Self-pay | Admitting: Adult Health

## 2018-10-04 ENCOUNTER — Other Ambulatory Visit: Payer: Self-pay | Admitting: Family Medicine

## 2018-10-04 NOTE — Telephone Encounter (Signed)
Scheduled appt per sch msg. Mailed printout  °

## 2018-10-21 ENCOUNTER — Other Ambulatory Visit: Payer: Self-pay | Admitting: *Deleted

## 2018-10-21 MED ORDER — ANASTROZOLE 1 MG PO TABS: 1.0000 mg | ORAL_TABLET | Freq: Every day | ORAL | 4 refills | Status: DC

## 2018-10-21 NOTE — Telephone Encounter (Signed)
This RN received call from pt stating she has not heard from [facility] regarding " cancer medicine Dr Jana Hakim wanted me to start on "  Noted prescription sent to [facility] on 10/01/2018 stating " received ".  Informed pt of above and request resent if possible computer issue.

## 2018-10-30 DIAGNOSIS — C50919 Malignant neoplasm of unspecified site of unspecified female breast: Secondary | ICD-10-CM | POA: Diagnosis not present

## 2018-10-30 DIAGNOSIS — N183 Chronic kidney disease, stage 3 (moderate): Secondary | ICD-10-CM | POA: Diagnosis not present

## 2018-10-30 DIAGNOSIS — E559 Vitamin D deficiency, unspecified: Secondary | ICD-10-CM | POA: Diagnosis not present

## 2018-10-30 DIAGNOSIS — I129 Hypertensive chronic kidney disease with stage 1 through stage 4 chronic kidney disease, or unspecified chronic kidney disease: Secondary | ICD-10-CM | POA: Diagnosis not present

## 2018-11-07 ENCOUNTER — Other Ambulatory Visit: Payer: Self-pay | Admitting: Family Medicine

## 2018-11-28 ENCOUNTER — Other Ambulatory Visit: Payer: Self-pay | Admitting: Family Medicine

## 2018-12-05 ENCOUNTER — Other Ambulatory Visit: Payer: Self-pay | Admitting: Family Medicine

## 2018-12-06 ENCOUNTER — Other Ambulatory Visit: Payer: Self-pay

## 2018-12-06 DIAGNOSIS — Z20822 Contact with and (suspected) exposure to covid-19: Secondary | ICD-10-CM

## 2018-12-07 LAB — NOVEL CORONAVIRUS, NAA: SARS-CoV-2, NAA: NOT DETECTED

## 2018-12-16 ENCOUNTER — Other Ambulatory Visit: Payer: Self-pay

## 2018-12-17 ENCOUNTER — Ambulatory Visit (INDEPENDENT_AMBULATORY_CARE_PROVIDER_SITE_OTHER): Payer: Medicare HMO | Admitting: Family Medicine

## 2018-12-17 ENCOUNTER — Encounter: Payer: Self-pay | Admitting: Family Medicine

## 2018-12-17 VITALS — BP 130/72 | HR 66 | Temp 98.5°F | Resp 16 | Ht 69.0 in | Wt 179.0 lb

## 2018-12-17 DIAGNOSIS — R609 Edema, unspecified: Secondary | ICD-10-CM | POA: Diagnosis not present

## 2018-12-17 DIAGNOSIS — I10 Essential (primary) hypertension: Secondary | ICD-10-CM | POA: Diagnosis not present

## 2018-12-17 DIAGNOSIS — G250 Essential tremor: Secondary | ICD-10-CM

## 2018-12-17 DIAGNOSIS — E782 Mixed hyperlipidemia: Secondary | ICD-10-CM | POA: Diagnosis not present

## 2018-12-17 DIAGNOSIS — R600 Localized edema: Secondary | ICD-10-CM | POA: Insufficient documentation

## 2018-12-17 DIAGNOSIS — R001 Bradycardia, unspecified: Secondary | ICD-10-CM | POA: Diagnosis not present

## 2018-12-17 DIAGNOSIS — E559 Vitamin D deficiency, unspecified: Secondary | ICD-10-CM

## 2018-12-17 MED ORDER — PROPRANOLOL HCL 40 MG PO TABS: 20.0000 mg | ORAL_TABLET | Freq: Two times a day (BID) | ORAL | 1 refills | Status: DC

## 2018-12-17 NOTE — Patient Instructions (Addendum)
We will call lab results Take 1/2 tablet twice a day of the propranolol Use compression hose  Call us in 2 weeks with blood pressure readings F/U 4 months for Physical

## 2018-12-17 NOTE — Progress Notes (Signed)
   Subjective:    Patient ID: Catherine Lowery, female    DOB: 1946/03/25, 73 y.o.   MRN: [id number]  Patient presents for Follow-up (is fasting)   Pt here to f/u chronic medical problems she was last seen 1 year ago.  Since then she has been diagnosed with breast cancer she has had chemotherapy and some follow-up by oncology and the breast surgeon.  She is now on a REM index.   CKD- Dr. Harrie Jeans-  tok her off HCTZ due worsening kidney function.  Was told to monitor her blood pressure and follow-up via phone but instead she came to me today because when she was seen there was also concern for bradycardia with her propanolol.   HTN-  140/ 80-100's , she is now on propranolol 40mg  twice a day , she stopped hctz     Hyperlipidemia on lipitor 40mg  at bedtime   Chronic tremor primidone 100mg  once a day she has not followed up with neurology since last year. This medication does help most days   Review Of Systems:  GEN- denies fatigue, fever, weight loss,weakness, recent illness HEENT- denies eye drainage, change in vision, nasal discharge, CVS- denies chest pain, palpitations RESP- denies SOB, cough, wheeze ABD- denies N/V, change in stools, abd pain GU- denies dysuria, hematuria, dribbling, incontinence MSK- denies joint pain, muscle aches, injury Neuro- denies headache, dizziness, syncope, seizure activity       Objective:    BP 130/72   Pulse 66   Temp 98.5 F (36.9 C) (Oral)   Resp 16   Ht 5\' 9"  (1.753 m)   Wt 179 lb (81.2 kg)   LMP 01/24/2000   SpO2 95%   BMI 26.43 kg/m  GEN- NAD, alert and oriented x3 HEENT- PERRL, EOMI, non injected sclera, pink conjunctiva, MMM, oropharynx clear Neck- Supple, no thyromegaly CVS- RRR, no murmur RESP-CTAB Neuro-CNII-XII in tact, mild tremor bilat hands  EXT-mild bilat ankle edema Pulses- Radial 2+   EKG- sinus bradycardia HR 51      Assessment & Plan:      Problem List Items Addressed This Visit      Unprioritized   Benign essential HTN    Her looks okay.  She does have some trace edema at her ankles since she stopped the hydrochlorothiazide.  We will have her elevate her feet and also use compression hose.  I will go ahead and decrease her propanolol down to 20 mg twice a day in the setting of her bradycardia she will monitor her blood pressure  And HR at home and call us in 2 weeks. Note she is not symptomatic from the bradycardia      Relevant Medications   propranolol (INDERAL) 40 MG tablet   Other Relevant Orders   Comprehensive metabolic panel   CBC with Differential/Platelet   Benign familial tremor - Primary    Continue Primidone      Relevant Orders   Comprehensive metabolic panel   CBC with Differential/Platelet   Bradycardia   Relevant Orders   EKG 12-Lead (Completed)   Hyperlipidemia   Relevant Medications   propranolol (INDERAL) 40 MG tablet   Other Relevant Orders   Lipid panel   Peripheral edema   Vitamin D deficiency      Note: This dictation was prepared with Dragon dictation along with smaller phrase technology. Any transcriptional errors that result from this process are unintentional.

## 2018-12-17 NOTE — Assessment & Plan Note (Addendum)
Her looks okay.  She does have some trace edema at her ankles since she stopped the hydrochlorothiazide.  We will have her elevate her feet and also use compression hose.  I will go ahead and decrease her propanolol down to 20 mg twice a day in the setting of her bradycardia she will monitor her blood pressure  And HR at home and call us in 2 weeks. Note she is not symptomatic from the bradycardia

## 2018-12-17 NOTE — Assessment & Plan Note (Signed)
Continue Primidone.

## 2018-12-18 LAB — COMPREHENSIVE METABOLIC PANEL
AG Ratio: 1.3 (calc) (ref 1.0–2.5)
ALT: 18 U/L (ref 6–29)
AST: 17 U/L (ref 10–35)
Albumin: 3.7 g/dL (ref 3.6–5.1)
Alkaline phosphatase (APISO): 66 U/L (ref 37–153)
BUN/Creatinine Ratio: 15 (calc) (ref 6–22)
BUN: 17 mg/dL (ref 7–25)
CO2: 24 mmol/L (ref 20–32)
Calcium: 9.3 mg/dL (ref 8.6–10.4)
Chloride: 108 mmol/L (ref 98–110)
Creat: 1.13 mg/dL — ABNORMAL HIGH (ref 0.60–0.93)
Globulin: 2.9 g/dL (calc) (ref 1.9–3.7)
Glucose, Bld: 94 mg/dL (ref 65–99)
Potassium: 4.2 mmol/L (ref 3.5–5.3)
Sodium: 140 mmol/L (ref 135–146)
Total Bilirubin: 0.5 mg/dL (ref 0.2–1.2)
Total Protein: 6.6 g/dL (ref 6.1–8.1)

## 2018-12-18 LAB — CBC WITH DIFFERENTIAL/PLATELET
Absolute Monocytes: 492 cells/uL (ref 200–950)
Basophils Absolute: 52 cells/uL (ref 0–200)
Basophils Relative: 1.3 %
Eosinophils Absolute: 232 cells/uL (ref 15–500)
Eosinophils Relative: 5.8 %
HCT: 33.9 % — ABNORMAL LOW (ref 35.0–45.0)
Hemoglobin: 10.8 g/dL — ABNORMAL LOW (ref 11.7–15.5)
Lymphs Abs: 1280 cells/uL (ref 850–3900)
MCH: 23.8 pg — ABNORMAL LOW (ref 27.0–33.0)
MCHC: 31.9 g/dL — ABNORMAL LOW (ref 32.0–36.0)
MCV: 74.7 fL — ABNORMAL LOW (ref 80.0–100.0)
MPV: 10.8 fL (ref 7.5–12.5)
Monocytes Relative: 12.3 %
Neutro Abs: 1944 cells/uL (ref 1500–7800)
Neutrophils Relative %: 48.6 %
Platelets: 182 10*3/uL (ref 140–400)
RBC: 4.54 10*6/uL (ref 3.80–5.10)
RDW: 16 % — ABNORMAL HIGH (ref 11.0–15.0)
Total Lymphocyte: 32 %
WBC: 4 10*3/uL (ref 3.8–10.8)

## 2018-12-18 LAB — LIPID PANEL
Cholesterol: 121 mg/dL (ref <200)
HDL: 31 mg/dL — ABNORMAL LOW (ref >=50)
LDL Cholesterol (Calc): 73 mg/dL (calc)
Non-HDL Cholesterol (Calc): 90 mg/dL (calc) (ref <130)
Total CHOL/HDL Ratio: 3.9 (calc) (ref <5.0)
Triglycerides: 87 mg/dL (ref <150)

## 2018-12-18 IMAGING — MR MR BILATERAL BREAST WITHOUT AND WITH CONTRAST
8 of 12 series · 29 of 48 positions shown · IV contrast (8ml gadavist)
Comparison: Previous exam(s).

CLINICAL DATA: 72-year-old female with biopsy proven right breast
cancer in January 2018.

LABS:  Creatinine was obtained on site at [HOSPITAL] at [REDACTED] [HOSPITAL].
Results: Creatinine 1.1 mg/dL.
EXAM:
BILATERAL BREAST MRI WITH AND WITHOUT CONTRAST
TECHNIQUE: Multiplanar, multisequence MR images of both breasts were obtained
prior to and following the intravenous administration of 8 ml of
Gadavist

[Series 2: t2_tirm_tra ipat (a-p) · axial · 3.0mm · 0.70mm/px · 1 of 68 slices shown]
[im 1/68]
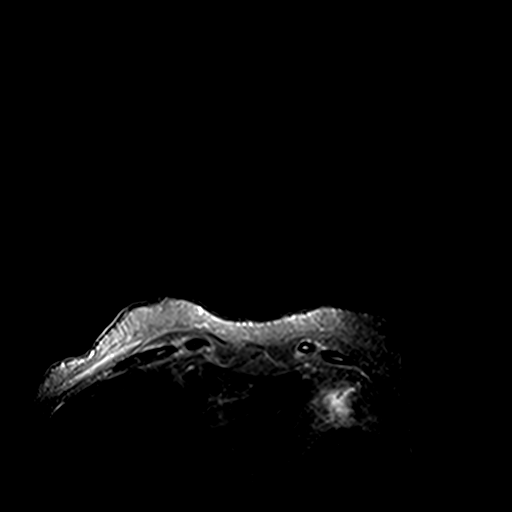

[Series 3: fl3d pre-cm no · axial · non-contrast · 0.9mm · 0.94mm/px · z∈[-64,+137]mm · 5 of 223 slices shown]
[im 1/223]
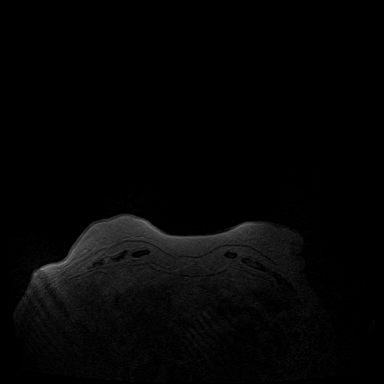
[im 56/223]
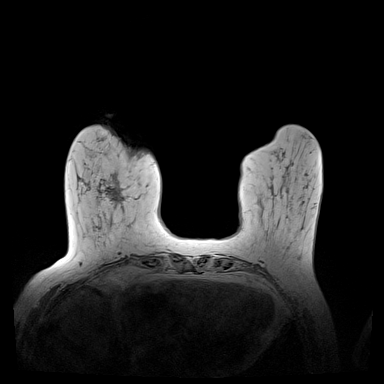
[im 112/223]
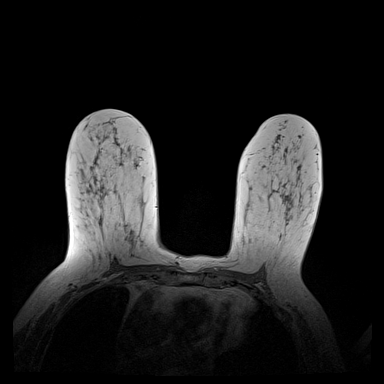
[im 167/223]
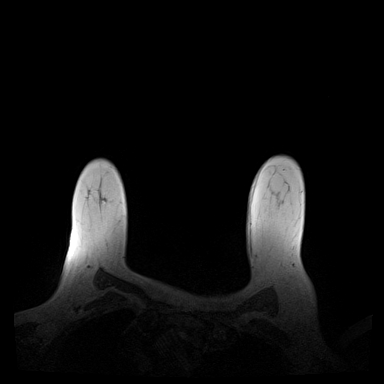
[im 223/223]
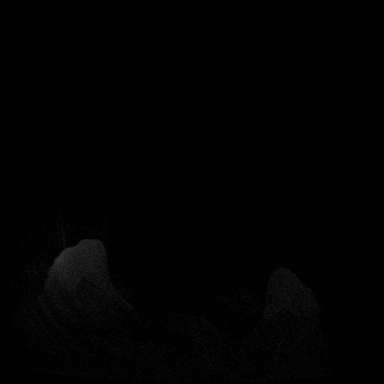

[Series 5: fl3d pre-cm · axial · non-contrast · 0.9mm · 0.87mm/px · z∈[-64,+137]mm · 5 of 224 slices shown]
[im 1/224]
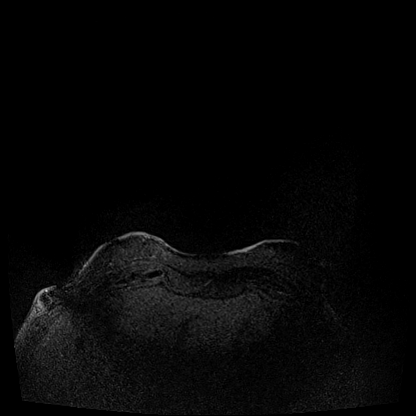
[im 56/224]
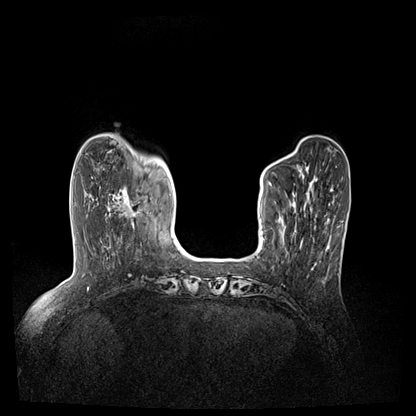
[im 112/224]
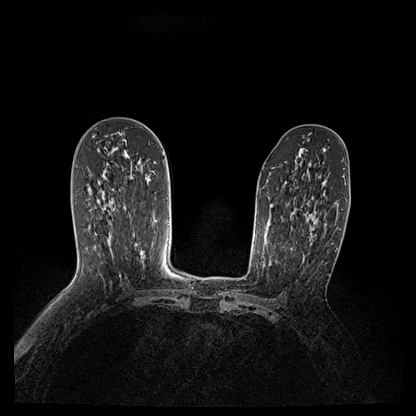
[im 168/224]
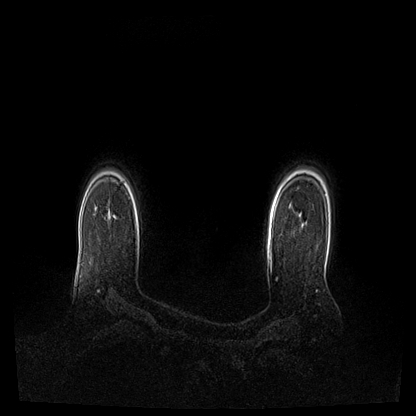
[im 224/224]
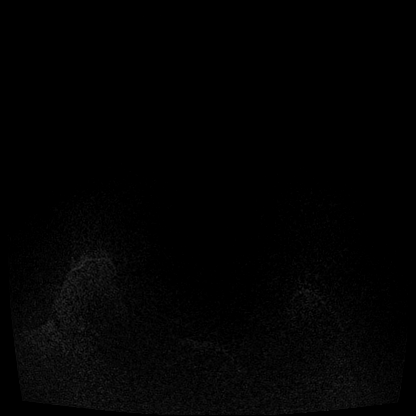

[Series 6: fl3d post-cm 20 · axial · 0.9mm · 0.87mm/px · z∈[-64,+137]mm · 5 of 224 slices shown (1 of 3)]
[im 1/224]
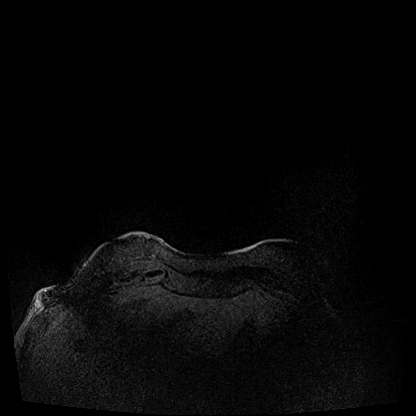
[im 56/224]
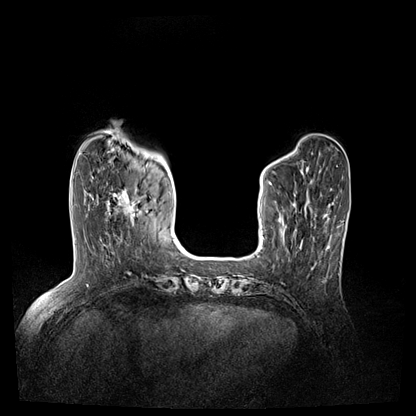
[im 112/224]
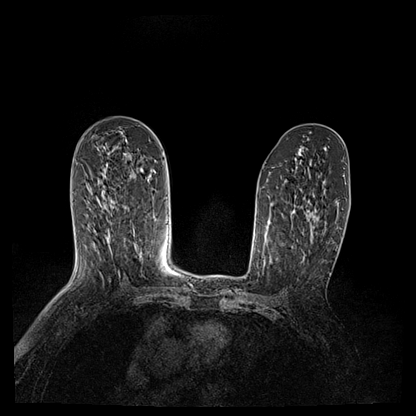
[im 168/224]
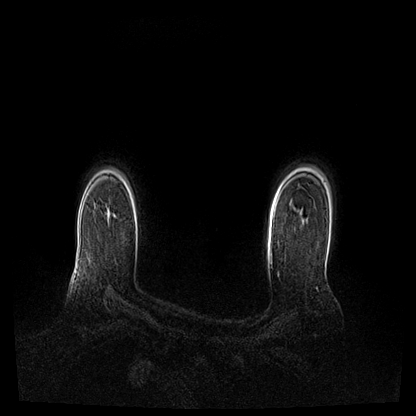
[im 224/224]
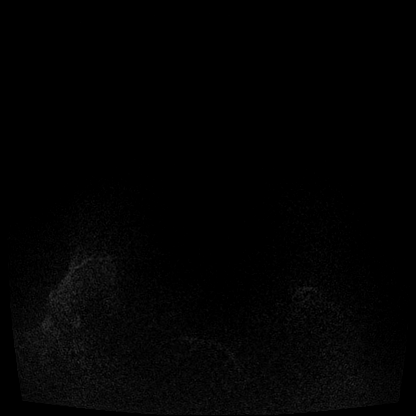

[Series 7: fl3d post-cm 20 · axial · 0.9mm · 0.87mm/px · z∈[-64,+137]mm · 5 of 220 slices shown (2 of 3)]
[im 1/220]
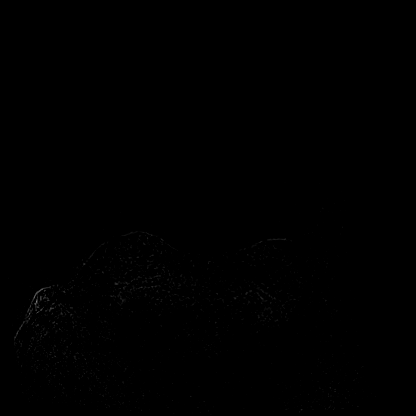
[im 55/220]
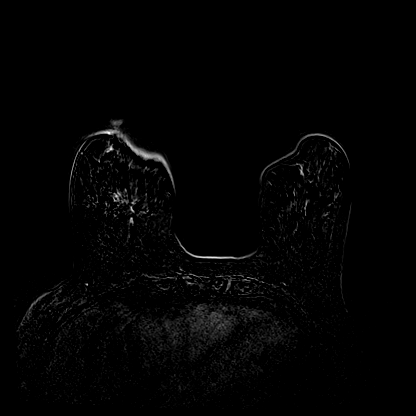
[im 110/220]
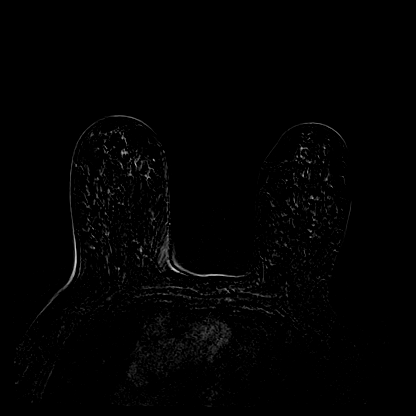
[im 165/220]
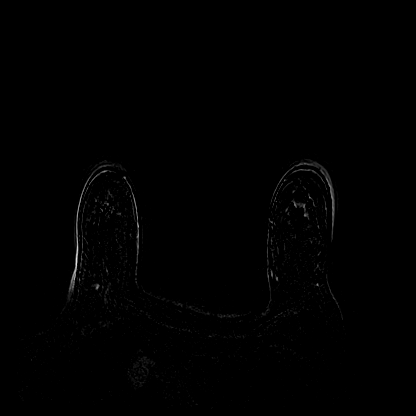
[im 220/220]
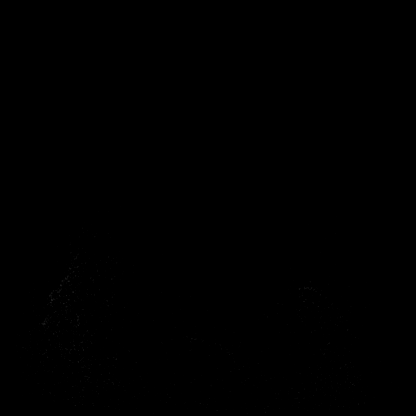

[Series 8: fl3d post-cm 20 · axial · 201.6mm · 0.87mm/px · 1 of 1 slices shown (3 of 3)]
[im 1/1]
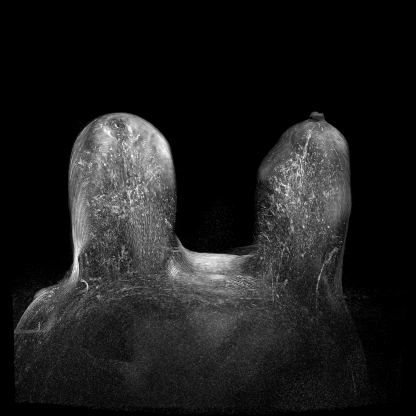

[Series 9: fl3d post-cm 3min · axial · 0.9mm · 0.87mm/px · z∈[-64,+137]mm · 6 of 224 slices shown]
[im 1/224]
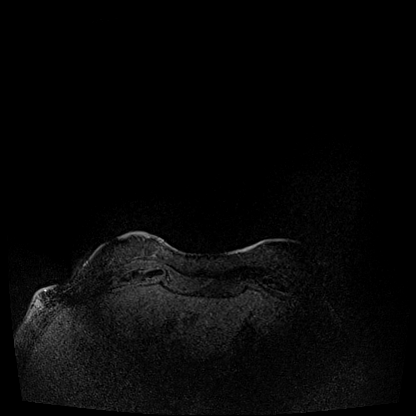
[im 45/224]
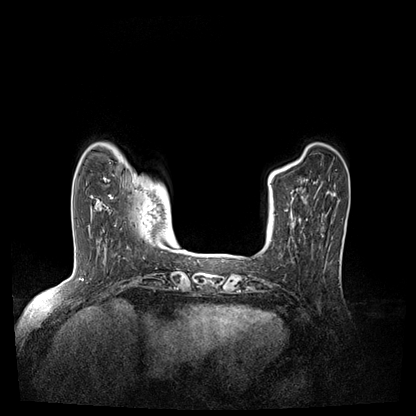
[im 90/224]
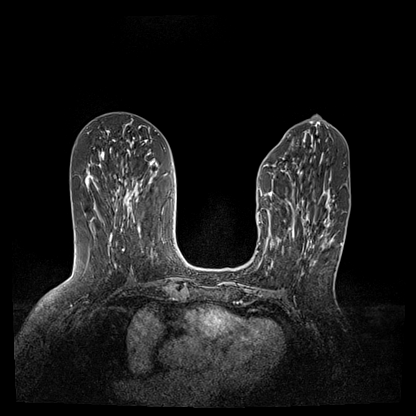
[im 134/224]
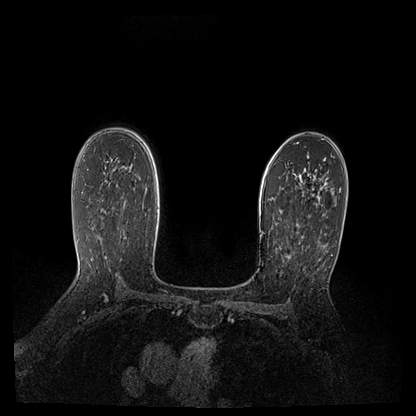
[im 179/224]
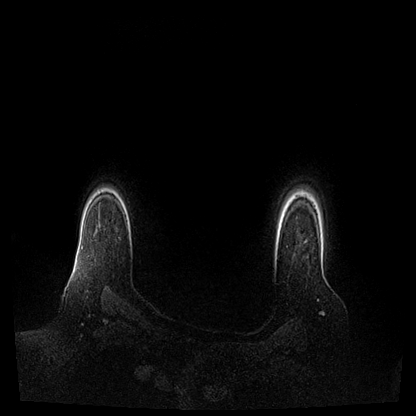
[im 224/224]
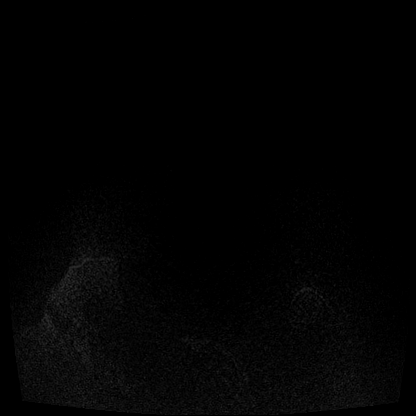

[Series 10: fl3d post-cm 3min_sub · axial · 0.9mm · 0.87mm/px · 1 of 223 slices shown]
[im 1/223]
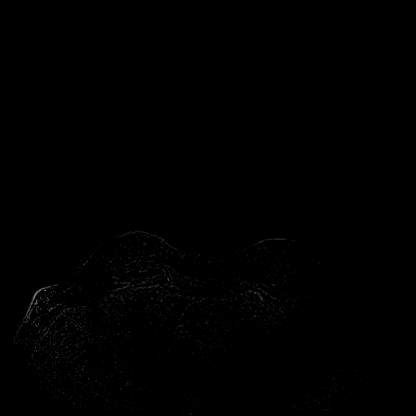

[29 of 48 positions shown; findings below may reference images not displayed]

Three-dimensional MR images were rendered by post-processing of the
original MR data on an independent workstation. The
three-dimensional MR images were interpreted, and findings are
reported in the following complete MRI report for this study. Three
dimensional images were evaluated at the independent [employer]
workstation
FINDINGS: Breast composition: b. Scattered fibroglandular tissue.

Background parenchymal enhancement: Moderate.

Right breast: There is a 2.3 x 1.9 x 1.1 cm enhancing mass with
associated signal void from post biopsy clip in the central inferior
right breast. This is consistent with the patient's biopsy-proven
malignancy. Confluent non mass enhancement and scattered enhancing
foci is seen surrounding the mass, spanning a total of 4.6 x 3.5 x
2.2 cm (AP by transverse by craniocaudal dimensions). No additional
suspicious enhancing masses are identified within the right breast.

Left breast: No mass or abnormal enhancement.

Lymph nodes: No abnormal appearing lymph nodes.

Ancillary findings:  None.
IMPRESSION: 1. Spiculated, enhancing central right breast mass consistent with
the patient's biopsy-proven malignancy. There is associated
confluent non-mass enhancement and scattered enhancing foci
surrounding the mass. This spans a total area 4.6 x 3.5 x 2.2 cm (AP
by transverse by craniocaudal dimensions).
2. No MRI evidence of malignancy on the left.

RECOMMENDATION:
Continue treatment plan.

BI-RADS CATEGORY  6: Known biopsy-proven malignancy.

## 2019-01-02 ENCOUNTER — Telehealth: Payer: Self-pay

## 2019-01-02 NOTE — Telephone Encounter (Signed)
bp for last 2 weeks.   Date:      Bp:             HR:  08/19     138/97          58 08/20     144/104        58 08/21     142/95         66 08/22     143/104       56 08/23      135/95        64 0824      145/96         64 08/25      121/75        51 08/26      142/92        62 08/27      149/97        59 08/28       144/102     61 08/29      144/99       57 08/30      138/88       58 08/31      217/89       56               150/108      59    1 hour later 09/01      139/93        57 09/02      142/92        62 09/03       125/88       54

## 2019-01-03 ENCOUNTER — Other Ambulatory Visit: Payer: Self-pay

## 2019-01-03 MED ORDER — AMLODIPINE BESYLATE 5 MG PO TABS: 5.0000 mg | ORAL_TABLET | Freq: Every day | ORAL | 3 refills | Status: DC

## 2019-01-03 MED ORDER — AMLODIPINE BESYLATE 5 MG PO TABS: 5.0000 mg | ORAL_TABLET | Freq: Every day | ORAL | 0 refills | Status: DC

## 2019-01-03 NOTE — Telephone Encounter (Signed)
Pt notified. Rx sent to [place] [facility].

## 2019-01-03 NOTE — Addendum Note (Signed)
Addended by: Vic Blackbird F on: 01/03/2019 08:41 AM   Modules accepted: Orders

## 2019-01-03 NOTE — Telephone Encounter (Signed)
Continue the lower dose of propranolol Start Norvasc 5mg  once a day for blood pressure, this will not affect her kidney function    Check BP daily, send readings in 2 weeks

## 2019-01-11 DIAGNOSIS — Z20828 Contact with and (suspected) exposure to other viral communicable diseases: Secondary | ICD-10-CM | POA: Diagnosis not present

## 2019-01-17 ENCOUNTER — Telehealth: Payer: Self-pay | Admitting: *Deleted

## 2019-01-17 MED ORDER — AMLODIPINE BESYLATE 10 MG PO TABS: 10.0000 mg | ORAL_TABLET | Freq: Every day | ORAL | 0 refills | Status: DC

## 2019-01-17 NOTE — Telephone Encounter (Signed)
Call placed to patient and patient made aware.   Medication list updated.

## 2019-01-17 NOTE — Telephone Encounter (Signed)
Received call from patient.   Reports that BP readings are as follows:  BP HR  4-Sep 132/89 59  5-Sep 138/104 58  6-Sep 101/90 64  7-Sep 133/94 58  8-Sep 136/114 77  9-Sep 139/97 64  10-Sep 144/98 57  11-Sep 139/98 61  12-Sep 150/98 65  13-Sep 150/109 71  14-Sep 122/86 66  15-Sep 131/91 59  16-Sep 132/90 61  17-Sep 150/105 51   Patient reports that she is taking Amlodipine 5mg  and Propanolol 20mg  daily.   MD please advise.

## 2019-01-17 NOTE — Telephone Encounter (Signed)
Increase novasc to 10mg  once a day

## 2019-01-26 NOTE — Progress Notes (Signed)
[place]  [phone] [phone]     ID: [name] DOB: May 06, 1945  MR#: [id number]  MAY#:[id number]  Patient Care Team: Alycia Rossetti, MD as PCP - General (Family Medicine) Debara Pickett [name], MD as PCP - Cardiology (Cardiology) , Virgie Dad, MD as Consulting Physician (Oncology) Gery Pray, MD as Consulting Physician (Radiation Oncology) Renette Butters, MD as Attending Physician (Orthopedic Surgery) [name], MD as Consulting Physician (General Surgery) Milus Banister, MD as Attending Physician (Gastroenterology) Penni Bombard, MD as Consulting Physician (Neurology) Estanislado Emms, MD as Consulting Physician (Nephrology) Mauro Kaufmann, RN as Oncology Nurse Navigator Rockwell Germany, RN as Oncology Nurse Navigator OTHER MD:   CHIEF COMPLAINT: 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 She was last seen here on 10/01/2018.   She continues on anastrozole.  She is "feeling hot" more than having hot flashes.  It is a definite change though.  She is not having any other side effects from this medication.  Danny's last bone density screening on 02/26/2018, showed a T-score of 1.4, which is considered normal.    Since her last visit here, she has not undergone any additional studies. She is up to date on mammography, which was last completed on 02/26/2018 at [facility] and she will be having her next mammogram in about a month   REVIEW OF SYSTEMS: Darriana "mows her own lawn".  She is very independent and of course she is taking care of her son.  "We have a lot of doctor appointments".  She has pretty much fully recovered from her surgery and radiation symptoms.  A detailed review of systems today was otherwise noncontributory    HISTORY OF CURRENT ILLNESS: From the original intake note:  Catherine Lowery had routine screening mammography on 02/26/2018 showing a possible abnormality in the right breast. She underwent unilateral right diagnostic mammography with tomography and right breast ultrasonography at [facility] on 03/07/2018 showing: Breast Density Category B. On mammography, there is a new 1.5 cm irregular architectural distortion with a spiculated margin in the right breast at 5 o'clock anterior depth. On ultrasound, there is a 2.7 cm irregular solid mass in the right breast at 5 o'clock middle depth. This irregular mass displays posterior acoustic shadowing. No significant abnormalities were seen sonographically in the right axilla.  Accordingly on 03/18/2018 she proceeded to biopsy of the right breast area in question. The pathology from this procedure showed ([id number]): Invasive mammary carcinoma e-cadherin negative, grade II-III. 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 Prognostic indicators significant for: estrogen receptor, 95% positive and progesterone receptor, 95% positive, both with strong staining intensity. Proliferation marker Ki67 at 5%. HER2 negative by immunohistochemistry (1+).  On 04/10/2018 the patient underwent bilateral breast MRI with and without contrast showing a 2.3 cm spiculated enhancing mass associated with non-masslike enhancement, the total abnormal area measuring 4.6 cm.  There was no evidence of lymph node abnormality and the contralateral breast was benign appearing  The patient's subsequent history is as detailed below.   PAST MEDICAL HISTORY: Past Medical History:  Diagnosis Date  . Cancer ([place]) 03/2018   right breast cancer  . Chronic kidney disease    CKD  . DJD (degenerative joint disease)    LEFT HIP  . Family history of breast cancer   . Family  history of ovarian cancer   . Family history of prostate cancer   . Family history of prostate cancer    . Hyperlipidemia    takes Simvasatin daily  . Hypertension    takes Propranolol and HCTZ daioly  . Hyperthyroidism   . Pneumonia 10/2015  . PONV (postoperative nausea and vomiting)    when ether used  . Shortness of breath dyspnea    with ambulation  . Tremors of nervous system    takes Primidone daily    PAST SURGICAL HISTORY: Past Surgical History:  Procedure Laterality Date  . BOne Spur     left heel  . BREAST LUMPECTOMY WITH RADIOACTIVE SEED AND SENTINEL LYMPH NODE BIOPSY Right 05/07/2018   Procedure: RIGHT BREAST LUMPECTOMY WITH RADIOACTIVE SEED AND RIGHT  SENTINEL LYMPH NODE MAPPING;  Surgeon: [name], MD;  Location: [place];  Service: General;  Laterality: Right;  . COLONOSCOPY    . JOINT REPLACEMENT Bilateral    knee  . KNEE SURGERY    . RE-EXCISION OF BREAST LUMPECTOMY Right 06/05/2018   Procedure: RE-EXCISION OF RIGHT BREAST LUMPECTOMY;  Surgeon: [name], MD;  Location: [place];  Service: General;  Laterality: Right;  . ROTATOR CUFF REPAIR Bilateral   . 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 TOTAL HIP ARTHROPLASTY Left 12/29/2015   Procedure: TOTAL HIP ARTHROPLASTY ANTERIOR APPROACH;  Surgeon: Ninetta Lights, MD;  Location: [place];  Service: Orthopedics;  Laterality: Left;     FAMILY HISTORY Family History  Problem Relation Age of Onset  . Hypertension Mother   . Heart disease Mother   . Stroke Mother   . Multiple myeloma Father 1  . Thyroid cancer Sister        dx 40's/50's  . Cancer Paternal Aunt        unknown  . Prostate cancer Paternal Uncle        dx >50- cancer was the cause of his death  . Ovarian cancer Paternal Aunt   . Throat cancer Paternal Uncle   . Breast cancer Cousin        dx >50  . Cancer Cousin        type unk  . Cancer Cousin        type unk  . Cancer Cousin        type unk   She notes that her father died from a multiple myeloma at age 28. Patients' mother died from stroke at  age 37. The patient has 1 brother and 2 sisters. One paternal aunt had ovarian cancer.  One cousin had breast cancer, and her 64s. Another paternal aunt had cancer, type unknown to the patient, and a paternal uncle had prostate cancer.    GYNECOLOGIC HISTORY:  Menarche: 73 years old Age at first live birth: 73 years old GX P: 1 LMP: Patient's last menstrual period was 01/24/2000. 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 Before that, she has been employed at [employer], [employer], and as an elderly caregiver. 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 She is separated, and has not spoken to her husband, Lenard Lance, in over 10 years. She lives with her only son, Ashlye Oviedo, who is disabled secondary to SCD and multiple surgeries. She has no  pets. She attends [employer] [facility].    ADVANCED DIRECTIVES: Her sister, Adria Dill, is Jatziri's medical power of attorney. Barbaraann Share can be reached at [phone]. Whitsel has stated that her husband, Lenard Lance, is not to have access to her medical records or to make decisions on her behalf. ]   HEALTH MAINTENANCE: Social History   Tobacco Use  . Smoking status: Current Every Day Smoker    Packs/day: 0.50    Years: 20.00    Pack years: 10.00  . Smokeless tobacco: Never Used  . Tobacco comment: 06/06/16 Pt stated "I'm trying to quit"  Substance Use Topics  . Alcohol use: No    Alcohol/week: 0.0 standard drinks  . Drug use: No     Colonoscopy: 01/2015  PAP:   Bone density:  02/26/2018: T-score 1.4   Allergies  Allergen Reactions  . Morphine And Related Itching  . Other Itching and Nausea And Vomiting    "PAIN MEDICINES"  . Percocet [Oxycodone-Acetaminophen] Nausea And Vomiting    Current Outpatient Medications  Medication Sig  Dispense Refill  . amLODipine (NORVASC) 10 MG tablet Take 1 tablet (10 mg total) by mouth daily. 90 tablet 4  . anastrozole (ARIMIDEX) 1 MG tablet Take 1 tablet (1 mg total) by mouth daily. 90 tablet 4  . aspirin 325 MG tablet Take 325 mg by mouth daily. Stop date 01/30/16    . atorvastatin (LIPITOR) 40 MG tablet Take 1 tablet (40 mg total) by mouth daily. 90 tablet 3  . [facility]S D3 50 MCG (2000 UT) CAPS TAKE 1 CAPSULE BY MOUTH EVERY DAY 300 capsule 3  . ibuprofen (ADVIL,MOTRIN) 800 MG tablet Take 1 tablet (800 mg total) by mouth every 8 (eight) hours as needed. 30 tablet 0  . Omega-3 Fatty Acids (FISH OIL) 1000 MG CPDR Take by mouth.    . primidone (MYSOLINE) 50 MG tablet TAKE 2 TABLETS (100 MG TOTAL) BY MOUTH EVERY MORNING. 180 tablet 0  . propranolol (INDERAL) 40 MG tablet Take 0.5 tablets (20 mg total) by mouth 2 (two) times daily. 180 tablet 1  . VOLTAREN 1 % GEL APPLY TOPICALLY 4 TIMES DAILY AS NEEDED 100 g 0   No current facility-administered medications for this visit.      OBJECTIVE: Middle-aged African-American woman who appears stated age  21:   01/27/19 1352  BP: (!) 104/59  Pulse: (!) 59  Resp: 18  Temp: 98 F (36.7 C)  SpO2: 100%   Wt Readings from Last 3 Encounters:  01/27/19 173 lb 11.2 oz (78.8 kg)  12/17/18 179 lb (81.2 kg)  10/01/18 174 lb 8 oz (79.2 kg)   Body mass index is 25.65 kg/m.    ECOG FS:1 - Symptomatic but completely ambulatory  Ocular: Sclerae unicteric, pupils round and equal Ear-nose-throat: Wearing a mask Lymphatic: No cervical or supraclavicular adenopathy Lungs no rales or rhonchi Heart regular rate and rhythm Abd soft, nontender, positive bowel sounds MSK no focal spinal tenderness, no joint edema Neuro: non-focal, well-oriented, appropriate affect Breasts: The right breast has undergone lumpectomy and radiation.  There is some skin thickening and some residual hyperpigmentation.  This is expected.  There is no evidence of disease  recurrence.  Left breast is benign.  Both axillae are benign.  LAB RESULTS:  CMP     Component Value Date/Time   NA 140 12/17/2018 1251   K 4.2 12/17/2018 1251   CL 108 12/17/2018 1251   CO2 24 12/17/2018 1251   GLUCOSE 94  12/17/2018 1251   BUN 17 12/17/2018 1251   CREATININE 1.13 (H) 12/17/2018 1251   CALCIUM 9.3 12/17/2018 1251   PROT 6.6 12/17/2018 1251   ALBUMIN 3.6 05/03/2018 1455   AST 17 12/17/2018 1251   ALT 18 12/17/2018 1251   ALKPHOS 72 05/03/2018 1455   BILITOT 0.5 12/17/2018 1251   GFRNONAA 40 (L) 05/31/2018 1500   GFRNONAA 48 (L) 01/23/2018 0804   GFRAA 46 (L) 05/31/2018 1500   GFRAA 56 (L) 01/23/2018 0804    No results found for: TOTALPROTELP, ALBUMINELP, A1GS, A2GS, BETS, BETA2SER, GAMS, MSPIKE, SPEI  No results found for: KPAFRELGTCHN, LAMBDASER, KAPLAMBRATIO  Lab Results  Component Value Date   WBC 3.4 (L) 01/27/2019   NEUTROABS 1.6 (L) 01/27/2019   HGB 11.3 (L) 01/27/2019   HCT 34.9 (L) 01/27/2019   MCV 72.0 (L) 01/27/2019   PLT 159 01/27/2019    '@LASTCHEMISTRY'$ @  No results found for: LABCA2  No components found for: [id number]  No results for input(s): INR in the last 168 hours.  No results found for: LABCA2  No results found for: WGN562  No results found for: ZHY865  No results found for: HQI696  No results found for: CA2729  No components found for: HGQUANT  No results found for: CEA1 / No results found for: CEA1   No results found for: AFPTUMOR  No results found for: CHROMOGRNA  No results found for: PSA1  Appointment on 01/27/2019  Component Date Value Ref Range Status  . WBC 01/27/2019 3.4* 4.0 - 10.5 K/uL Final  . RBC 01/27/2019 4.85  3.87 - 5.11 MIL/uL Final  . Hemoglobin 01/27/2019 11.3* 12.0 - 15.0 g/dL Final  . HCT 01/27/2019 34.9* 36.0 - 46.0 % Final  . MCV 01/27/2019 72.0* 80.0 - 100.0 fL Final  . MCH 01/27/2019 23.3* 26.0 - 34.0 pg Final  . MCHC 01/27/2019 32.4  30.0 - 36.0 g/dL Final  . RDW 01/27/2019 14.8   11.5 - 15.5 % Final  . Platelets 01/27/2019 159  150 - 400 K/uL Final  . nRBC 01/27/2019 0.0  0.0 - 0.2 % Final  . Neutrophils Relative % 01/27/2019 46  % Final  . Neutro Abs 01/27/2019 1.6* 1.7 - 7.7 K/uL Final  . Lymphocytes Relative 01/27/2019 33  % Final  . Lymphs Abs 01/27/2019 1.1  0.7 - 4.0 K/uL Final  . Monocytes Relative 01/27/2019 14  % Final  . Monocytes Absolute 01/27/2019 0.5  0.1 - 1.0 K/uL Final  . Eosinophils Relative 01/27/2019 6  % Final  . Eosinophils Absolute 01/27/2019 0.2  0.0 - 0.5 K/uL Final  . Basophils Relative 01/27/2019 1  % Final  . Basophils Absolute 01/27/2019 0.0  0.0 - 0.1 K/uL Final  . Immature Granulocytes 01/27/2019 0  % Final  . Abs Immature Granulocytes 01/27/2019 0.01  0.00 - 0.07 K/uL Final   Performed at [facility], [place] [address], [place], [place] [address]    (this displays the last labs from the last 3 days)  No results found for: TOTALPROTELP, ALBUMINELP, A1GS, A2GS, BETS, BETA2SER, GAMS, MSPIKE, SPEI (this displays SPEP labs)  No results found for: KPAFRELGTCHN, LAMBDASER, KAPLAMBRATIO (kappa/lambda light chains)  No results found for: HGBA, HGBA2QUANT, HGBFQUANT, HGBSQUAN (Hemoglobinopathy evaluation)   No results found for: LDH  No results found for: IRON, TIBC, IRONPCTSAT (Iron and TIBC)  No results found for: FERRITIN  Urinalysis    Component Value Date/Time   COLORURINE YELLOW 09/21/2017 1533   APPEARANCEUR CLOUDY (  A) 09/21/2017 1533   LABSPEC 1.015 09/21/2017 1533   PHURINE 5.5 09/21/2017 1533   GLUCOSEU NEGATIVE 09/21/2017 1533   HGBUR TRACE (A) 09/21/2017 1533   BILIRUBINUR SMALL (A) 03/24/2011 1054   KETONESUR NEGATIVE 09/21/2017 1533   PROTEINUR NEGATIVE 09/21/2017 1533   UROBILINOGEN 0.2 03/24/2011 1054   NITRITE NEGATIVE 09/21/2017 1533   LEUKOCYTESUR TRACE (A) 09/21/2017 1533     STUDIES:  No results found.  ELIGIBLE FOR AVAILABLE RESEARCH PROTOCOL: no   ASSESSMENT: 73 y.o.  [employer], [place] woman status post right breast lower inner quadrant biopsy 03/18/2018 for a clinical T2N0, [name], grade 2 or 3, estrogen and progesterone receptor positive, [name], with an MIB-1 of 5%  (1) Genetic testing performed through [facility] Common Hereditary Cancers Panel + Thyroid Cancer Panel on 05/09/2018 showing no deleterious mutations APC, [name], [name], [name], [name], [name], [name], [name], [name], [name], [name], [name], [name], [name], [name], [name], [name], [name], [name], [name], [name], KIT, MEN1, [name], [name], [name], [name], [name], [name], [name], [name], [name], [name], [name], [name], [name], POLE, [name], [name], RAD50, [name], [name], [name], [name], [name], [name], [name], [name], [name], [name], [name], [name], [name], [name], [name], VHL.   (a) Two variants of uncertain significance in the genes [name] c.1176C>T (Silent) and [name] c.1419+4C>T (Intronic) were identified.  (2) right lumpectomy and sentinel lymph node sampling 05/07/2018 showed a pT2 pN1, [name], grade 2, with a positive inferior margin  (a) reexcision scheduled for 06/05/2018  (3) MammaPrint obtained from the definitive surgical sample read as "low risk" predicting a disease-free survival at 5 years of 97.8% without need of chemotherapy.  (4) adjuvant radiation 07/11/2018 - 08/27/2018  (a) 1. Right breast; [name]; [name]; 6 fractions of 2 Gy for a total of 12 Gy  (5) anastrozole started 10/02/2018  (a) bone density 10/29/219 at [facility] shows a T scoe of +1.4 (normal)  (6) genetic testing 05/18/2018 through the Common Hereditary Cancers Panel + Thyroid [facility] panel showed no deleterious mutations in APC, [name], [name], [name], [name], [name], [name], [name], [name], [name], [name], [name], [name], [name], [name],  [name], [name], [name], [name], [name], [name], KIT, MEN1, [name], [name], [name], [name], [name], [name], [name], [name], [name], [name], [name], [name], [name], POLE, [name], [name], RAD50, [name], [name], [name], [name], [name], [name], [name], [name], [name], [name], [name], [name], [name], [name], [name], VHL.  (a) 2 variants of uncertain significance in the genes [name] c.1176C>T (Silent) and [name] c.1419+4C>T (Intronic) were identified.    (b) UPDATE: The [name] c.1419+4C>T (Intronic) VUS has been reclassified to "Likely Benign." The report date is 08/08/2018.  (7) thalassemia: confirmatory labs pending   PLAN: Kamylle is tolerating anastrozole well and the plan will be to continue that a minimum of 5 years.  She has persistently microcytic indices and very likely carries a thalassemia trait.  We will obtain additional lab work next year including a hemoglobin electrophoresis.  Of course she carries a sickle cell trait.  She understands that does not cause anemia or microcytosis  She will have her next mammogram in about a month.  She  knows to call for any other issues that may develop before then.     , Virgie Dad, MD  01/27/19 2:19 PM Medical Oncology and Hematology [facility] [place], [place] [address] Tel. [phone]    Fax. [phone]  I, Jacqualyn Posey am acting as a Education administrator for Chauncey Cruel, MD.   I, [name] MD, have reviewed the above documentation for accuracy and completeness, and I agree with the above.

## 2019-01-27 ENCOUNTER — Inpatient Hospital Stay: Payer: Medicare HMO | Attending: Oncology | Admitting: Oncology

## 2019-01-27 ENCOUNTER — Other Ambulatory Visit: Payer: Self-pay

## 2019-01-27 ENCOUNTER — Inpatient Hospital Stay: Payer: Medicare HMO

## 2019-01-27 VITALS — BP 104/59 | HR 59 | Temp 98.0°F | Resp 18 | Ht 69.0 in | Wt 173.7 lb

## 2019-01-27 DIAGNOSIS — Z85850 Personal history of malignant neoplasm of thyroid: Secondary | ICD-10-CM | POA: Insufficient documentation

## 2019-01-27 DIAGNOSIS — C50311 Malignant neoplasm of lower-inner quadrant of right female breast: Secondary | ICD-10-CM | POA: Diagnosis not present

## 2019-01-27 DIAGNOSIS — I10 Essential (primary) hypertension: Secondary | ICD-10-CM | POA: Insufficient documentation

## 2019-01-27 DIAGNOSIS — Z79811 Long term (current) use of aromatase inhibitors: Secondary | ICD-10-CM | POA: Insufficient documentation

## 2019-01-27 DIAGNOSIS — Z79899 Other long term (current) drug therapy: Secondary | ICD-10-CM | POA: Diagnosis not present

## 2019-01-27 DIAGNOSIS — F17210 Nicotine dependence, cigarettes, uncomplicated: Secondary | ICD-10-CM | POA: Diagnosis not present

## 2019-01-27 DIAGNOSIS — Z170 Estrogen receptor positive status [ER+]: Secondary | ICD-10-CM | POA: Insufficient documentation

## 2019-01-27 DIAGNOSIS — Z791 Long term (current) use of non-steroidal anti-inflammatories (NSAID): Secondary | ICD-10-CM | POA: Insufficient documentation

## 2019-01-27 DIAGNOSIS — Z7982 Long term (current) use of aspirin: Secondary | ICD-10-CM | POA: Diagnosis not present

## 2019-01-27 DIAGNOSIS — N189 Chronic kidney disease, unspecified: Secondary | ICD-10-CM | POA: Diagnosis not present

## 2019-01-27 DIAGNOSIS — Z803 Family history of malignant neoplasm of breast: Secondary | ICD-10-CM | POA: Diagnosis not present

## 2019-01-27 DIAGNOSIS — Z923 Personal history of irradiation: Secondary | ICD-10-CM | POA: Diagnosis not present

## 2019-01-27 DIAGNOSIS — Z8041 Family history of malignant neoplasm of ovary: Secondary | ICD-10-CM | POA: Diagnosis not present

## 2019-01-27 DIAGNOSIS — D573 Sickle-cell trait: Secondary | ICD-10-CM | POA: Diagnosis not present

## 2019-01-27 DIAGNOSIS — Z8042 Family history of malignant neoplasm of prostate: Secondary | ICD-10-CM | POA: Insufficient documentation

## 2019-01-27 LAB — CBC WITH DIFFERENTIAL/PLATELET
Abs Immature Granulocytes: 0.01 10*3/uL (ref 0.00–0.07)
Basophils Absolute: 0 10*3/uL (ref 0.0–0.1)
Basophils Relative: 1 %
Eosinophils Absolute: 0.2 10*3/uL (ref 0.0–0.5)
Eosinophils Relative: 6 %
HCT: 34.9 % — ABNORMAL LOW (ref 36.0–46.0)
Hemoglobin: 11.3 g/dL — ABNORMAL LOW (ref 12.0–15.0)
Immature Granulocytes: 0 %
Lymphocytes Relative: 33 %
Lymphs Abs: 1.1 10*3/uL (ref 0.7–4.0)
MCH: 23.3 pg — ABNORMAL LOW (ref 26.0–34.0)
MCHC: 32.4 g/dL (ref 30.0–36.0)
MCV: 72 fL — ABNORMAL LOW (ref 80.0–100.0)
Monocytes Absolute: 0.5 10*3/uL (ref 0.1–1.0)
Monocytes Relative: 14 %
Neutro Abs: 1.6 10*3/uL — ABNORMAL LOW (ref 1.7–7.7)
Neutrophils Relative %: 46 %
Platelets: 159 10*3/uL (ref 150–400)
RBC: 4.85 MIL/uL (ref 3.87–5.11)
RDW: 14.8 % (ref 11.5–15.5)
WBC: 3.4 10*3/uL — ABNORMAL LOW (ref 4.0–10.5)
nRBC: 0 % (ref 0.0–0.2)

## 2019-01-27 LAB — COMPREHENSIVE METABOLIC PANEL
ALT: 15 U/L (ref 0–44)
AST: 17 U/L (ref 15–41)
Albumin: 3.9 g/dL (ref 3.5–5.0)
Alkaline Phosphatase: 73 U/L (ref 38–126)
Anion gap: 7 (ref 5–15)
BUN: 18 mg/dL (ref 8–23)
CO2: 26 mmol/L (ref 22–32)
Calcium: 9 mg/dL (ref 8.9–10.3)
Chloride: 107 mmol/L (ref 98–111)
Creatinine, Ser: 1.21 mg/dL — ABNORMAL HIGH (ref 0.44–1.00)
GFR calc Af Amer: 51 mL/min — ABNORMAL LOW (ref >60)
GFR calc non Af Amer: 44 mL/min — ABNORMAL LOW (ref >60)
Glucose, Bld: 92 mg/dL (ref 70–99)
Potassium: 4.1 mmol/L (ref 3.5–5.1)
Sodium: 140 mmol/L (ref 135–145)
Total Bilirubin: 0.5 mg/dL (ref 0.3–1.2)
Total Protein: 7.2 g/dL (ref 6.5–8.1)

## 2019-01-27 MED ORDER — AMLODIPINE BESYLATE 10 MG PO TABS: 10.0000 mg | ORAL_TABLET | Freq: Every day | ORAL | 4 refills | Status: DC

## 2019-01-29 ENCOUNTER — Telehealth: Payer: Self-pay | Admitting: Oncology

## 2019-01-29 NOTE — Telephone Encounter (Signed)
I talk with patient regarding schedule  

## 2019-02-18 DIAGNOSIS — Z853 Personal history of malignant neoplasm of breast: Secondary | ICD-10-CM | POA: Diagnosis not present

## 2019-02-19 ENCOUNTER — Encounter: Payer: Self-pay | Admitting: Oncology

## 2019-02-25 ENCOUNTER — Other Ambulatory Visit: Payer: Self-pay | Admitting: Family Medicine

## 2019-03-05 ENCOUNTER — Other Ambulatory Visit: Payer: Self-pay | Admitting: *Deleted

## 2019-03-05 MED ORDER — AMLODIPINE BESYLATE 10 MG PO TABS: 10.0000 mg | ORAL_TABLET | Freq: Every day | ORAL | 4 refills | Status: DC

## 2019-03-14 ENCOUNTER — Other Ambulatory Visit: Payer: Self-pay | Admitting: Family Medicine

## 2019-03-25 ENCOUNTER — Telehealth: Payer: Self-pay | Admitting: Adult Health

## 2019-03-25 NOTE — Telephone Encounter (Signed)
I talk with patient and she said she doesn't have a way to do virtual and she cant receive text

## 2019-04-03 ENCOUNTER — Other Ambulatory Visit: Payer: Self-pay

## 2019-04-03 MED ORDER — PRIMIDONE 50 MG PO TABS: 100.0000 mg | ORAL_TABLET | Freq: Every morning | ORAL | 2 refills | Status: DC

## 2019-04-03 MED ORDER — PROPRANOLOL HCL 40 MG PO TABS: 20.0000 mg | ORAL_TABLET | Freq: Two times a day (BID) | ORAL | 2 refills | Status: DC

## 2019-04-03 MED ORDER — ATORVASTATIN CALCIUM 40 MG PO TABS: 40.0000 mg | ORAL_TABLET | Freq: Every day | ORAL | 2 refills | Status: DC

## 2019-04-07 ENCOUNTER — Telehealth: Payer: Self-pay | Admitting: Adult Health

## 2019-04-07 NOTE — Telephone Encounter (Signed)
[facility] meeting changed time of 12/8 telephone visit. Confirmed with patient.

## 2019-04-08 ENCOUNTER — Encounter: Payer: Self-pay | Admitting: Adult Health

## 2019-04-08 ENCOUNTER — Encounter: Payer: Self-pay | Admitting: *Deleted

## 2019-04-08 ENCOUNTER — Inpatient Hospital Stay: Payer: Medicare HMO | Attending: Oncology | Admitting: Adult Health

## 2019-04-08 DIAGNOSIS — Z170 Estrogen receptor positive status [ER+]: Secondary | ICD-10-CM

## 2019-04-08 DIAGNOSIS — C50311 Malignant neoplasm of lower-inner quadrant of right female breast: Secondary | ICD-10-CM | POA: Diagnosis not present

## 2019-04-08 NOTE — Progress Notes (Signed)
SURVIVORSHIP VIRTUAL VISIT:  I connected with Catherine Lowery on 04/08/19 at 10:00 AM EST by telephone and verified that I am speaking with the correct person using two identifiers.  I discussed the limitations, risks, security and privacy concerns of performing an evaluation and management service by telephone and the availability of in person appointments. I also discussed with the patient that there may be a patient responsible charge related to this service. The patient expressed understanding and agreed to proceed.   Patient location: at home in bedroom, private Provider location: [facility] in office  BRIEF ONCOLOGIC HISTORY:  Oncology History  Malignant neoplasm of lower-inner quadrant of right breast of female, estrogen receptor positive ([place])  03/18/2018 Initial Diagnosis   Screening mammography on 02/27/2019 revealed a right breast abnormality, Right breast lower inner quadrant biopsy for a clinical T2N0, [name], grade 2 or 3, estrogen and progesterone receptor positive, [name], with an MIB-1 of 5%   05/07/2018 Cancer Staging   Staging form: Breast, AJCC 8th Edition - Pathologic stage from 05/07/2018: Stage IB (pT2, pN33m, cM0, G2, ER+, PR+, HER2-) - Signed by CGardenia Phlegm NP on 04/07/2019   05/07/2018 Surgery   pT2 pN1, [name], grade 2, with a positive inferior margin.   05/07/2018 Oncotype testing   MammaPrint obtained from the definitive surgical sample read as "low risk" predicting a disease-free survival at 5 years of 97.8% without need of chemotherapy.   05/18/2018 Genetic Testing   The Common Hereditary Cancers Panel + Thyroid Caner panel was ordered (55 genes): APC, [name], [name], [name], [name], [name], [name], [name], [name], [name], [name], [name], [name], [name], [name], [name], [name], [name], [name], [name], [name], KIT, MEN1, [name], [name], [name], [name], [name], [name], [name], [name], [name], [name],  [name], [name], [name], POLE, [name], [name], RAD50, [name], [name], [name], [name], [name], [name], [name], [name], [name], [name], [name], [name], [name], [name], [name], VHL.  Results: No pathogenic variants identified.  2 variants of uncertain significance in the genes [name] c.1176C>T (Silent) and [name] c.1419+4C>T (Intronic) were identified.  The date of this test report is 05/18/2018.   UPDATE: The [name] c.1419+4C>T (Intronic) VUS has been reclassified to "Likely Benign." The report date is 08/08/2018.    06/05/2018 Surgery   ([id number]) Reexcision of the right breast; all margins negative.   07/11/2018 - 08/27/2018 Radiation Therapy   The patient initially received a dose of 50.4 Gy in 28 fractions to the breast using whole-breast tangent fields with an additional 45 Gy in 25 fractions to the right axilla. This was delivered using a 3-D conformal technique. The pt received a boost delivering an additional 12 Gy in 6 fractions using a electron boost with 181m electrons. The total dose was 107.4 Gy.    09/2018 - 09/2023 Anti-estrogen oral therapy   anastrozole     INTERVAL HISTORY:  Catherine Lowery review her survivorship care plan detailing her treatment course for breast cancer, as well as monitoring long-term side effects of that treatment, education regarding health maintenance, screening, and overall wellness and health promotion.     Overall, Ms. ThUriarteeports feeling quite well.  She is taking Anastrozole daily.  She denies arthralgias or vaginal dryness.  She has hot flashes, however these are tolerable.    REVIEW OF SYSTEMS:  Review of Systems  Constitutional: Negative for appetite change, chills, fatigue, fever and unexpected weight change.  HENT:   Negative for hearing loss and lump/mass.   Eyes: Negative for  eye problems and icterus.  Respiratory: Negative for chest tightness, cough and shortness of breath.   Cardiovascular: Negative for chest pain, leg swelling and palpitations.   Gastrointestinal: Negative for abdominal distention, abdominal pain, constipation, diarrhea, nausea and vomiting.  Endocrine: Positive for hot flashes.  Genitourinary: Negative for difficulty urinating.   Musculoskeletal: Negative for arthralgias.  Skin: Negative for itching and rash.  Neurological: Negative for dizziness, extremity weakness and numbness.  Hematological: Negative for adenopathy. Does not bruise/bleed easily.  Psychiatric/Behavioral: Negative for depression. The patient is not nervous/anxious.   Breast: Denies any new nodularity, masses, tenderness, nipple changes, or nipple discharge.      ONCOLOGY TREATMENT TEAM:  1. Surgeon:  Dr. Brantley Stage at [facility] 2. Medical Oncologist: Dr. Jana Hakim  3. Radiation Oncologist: Dr. Sondra Come    PAST MEDICAL/SURGICAL HISTORY:  Past Medical History:  Diagnosis Date  . Cancer ([place]) 03/2018   right breast cancer  . Chronic kidney disease    CKD  . DJD (degenerative joint disease)    LEFT HIP  . Family history of breast cancer   . Family history of ovarian cancer   . Family history of prostate cancer   . Family history of prostate cancer   . Hyperlipidemia    takes Simvasatin daily  . Hypertension    takes Propranolol and HCTZ daioly  . Hyperthyroidism   . Pneumonia 10/2015  . PONV (postoperative nausea and vomiting)    when ether used  . Shortness of breath dyspnea    with ambulation  . Tremors of nervous system    takes Primidone daily   Past Surgical History:  Procedure Laterality Date  . BOne Spur     left heel  . BREAST LUMPECTOMY WITH RADIOACTIVE SEED AND SENTINEL LYMPH NODE BIOPSY Right 05/07/2018   Procedure: RIGHT BREAST LUMPECTOMY WITH RADIOACTIVE SEED AND RIGHT  SENTINEL LYMPH NODE MAPPING;  Surgeon: [name], MD;  Location: [place];  Service: General;  Laterality: Right;  . COLONOSCOPY    . JOINT REPLACEMENT Bilateral    knee  . KNEE SURGERY    . RE-EXCISION OF BREAST  LUMPECTOMY Right 06/05/2018   Procedure: RE-EXCISION OF RIGHT BREAST LUMPECTOMY;  Surgeon: [name], MD;  Location: [place];  Service: General;  Laterality: Right;  . ROTATOR CUFF REPAIR Bilateral   . 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 TOTAL HIP ARTHROPLASTY Left 12/29/2015   Procedure: TOTAL HIP ARTHROPLASTY ANTERIOR APPROACH;  Surgeon: Ninetta Lights, MD;  Location: [place];  Service: Orthopedics;  Laterality: Left;     ALLERGIES:  Allergies  Allergen Reactions  . Morphine And Related Itching  . Other Itching and Nausea And Vomiting    "PAIN MEDICINES"  . Percocet [Oxycodone-Acetaminophen] Nausea And Vomiting     CURRENT MEDICATIONS:  Outpatient Encounter Medications as of 04/08/2019  Medication Sig  . amLODipine (NORVASC) 10 MG tablet Take 1 tablet (10 mg total) by mouth daily.  Marland Kitchen anastrozole (ARIMIDEX) 1 MG tablet Take 1 tablet (1 mg total) by mouth daily.  Marland Kitchen aspirin 325 MG tablet Take 325 mg by mouth daily. Stop date 01/30/16  . atorvastatin (LIPITOR) 40 MG tablet Take 1 tablet (40 mg total) by mouth daily.  . CVS D3 50 MCG (2000 UT) CAPS TAKE 1 CAPSULE BY MOUTH EVERY DAY  . ibuprofen (ADVIL,MOTRIN) 800 MG tablet Take 1 tablet (800 mg total) by mouth every 8 (eight) hours as needed.  . Omega-3 Fatty Acids (FISH OIL) 1000  MG CPDR Take by mouth.  . primidone (MYSOLINE) 50 MG tablet Take 2 tablets (100 mg total) by mouth every morning.  . propranolol (INDERAL) 40 MG tablet Take 0.5 tablets (20 mg total) by mouth 2 (two) times daily.  . VOLTAREN 1 % GEL APPLY TOPICALLY 4 TIMES DAILY AS NEEDED   No facility-administered encounter medications on file as of 04/08/2019.      ONCOLOGIC FAMILY HISTORY:  Family History  Problem Relation Age of Onset  . Hypertension Mother   . Heart disease Mother   . Stroke Mother   . Multiple myeloma Father 75  . Thyroid cancer Sister        dx 40's/50's  . Cancer Paternal Aunt        unknown  . Prostate cancer  Paternal Uncle        dx >50- cancer was the cause of his death  . Ovarian cancer Paternal Aunt   . Throat cancer Paternal Uncle   . Breast cancer Cousin        dx >50  . Cancer Cousin        type unk  . Cancer Cousin        type unk  . Cancer Cousin        type unk     GENETIC COUNSELING/TESTING: See above  SOCIAL HISTORY:  Social History   Socioeconomic History  . Marital status: Married    Spouse name: Not on file  . Number of children: 1  . Years of education: 60  . Highest education level: Not on file  Occupational History    Comment: retired  Scientific laboratory technician  . Financial resource strain: Not on file  . Food insecurity    Worry: Not on file    Inability: Not on file  . Transportation needs    Medical: Not on file    Non-medical: Not on file  Tobacco Use  . Smoking status: Current Every Day Smoker    Packs/day: 0.50    Years: 20.00    Pack years: 10.00  . Smokeless tobacco: Never Used  . Tobacco comment: 06/06/16 Pt stated "I'm trying to quit"  Substance and Sexual Activity  . Alcohol use: No    Alcohol/week: 0.0 standard drinks  . Drug use: No  . Sexual activity: Not Currently    Birth control/protection: Post-menopausal  Lifestyle  . Physical activity    Days per week: Not on file    Minutes per session: Not on file  . Stress: Not on file  Relationships  . Social Herbalist on phone: Not on file    Gets together: Not on file    Attends religious service: Not on file    Active member of club or organization: Not on file    Attends meetings of clubs or organizations: Not on file    Relationship status: Not on file  . Intimate partner violence    Fear of current or ex partner: Not on file    Emotionally abused: Not on file    Physically abused: Not on file    Forced sexual activity: Not on file  Other Topics Concern  . Not on file  Social History Narrative   Lives with handicapped son   Caffeine- coffee, 2 cups daily, 1-2 sodas daily      OBSERVATIONS/OBJECTIVE:   LABORATORY DATA:  None for this visit.  DIAGNOSTIC IMAGING:      ASSESSMENT AND PLAN:  Ms.. Estrella is  a pleasant 73 y.o. 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  She presents to the Survivorship Clinic for our initial meeting and routine follow-up post-completion of treatment for breast cancer.    1. Stage IB right breast cancer:  Ms. Russell is continuing to recover from definitive treatment for breast cancer. She will follow-up with her medical oncologist, Dr. Jana Hakim in 06/2019 with history and physical exam per surveillance protocol.  She will continue her anti-estrogen therapy with Anastrozole. Thus far, she is tolerating the Anastrozole well, with minimal side effects. Her mammogram is due 01/2020. Today, a comprehensive survivorship care plan and treatment summary was reviewed with the patient today detailing her breast cancer diagnosis, treatment course, potential late/long-term effects of treatment, appropriate follow-up care with recommendations for the future, and patient education resources.  A copy of this summary, along with a letter will be sent to the patient's primary care provider via mail/fax/In Basket message after today's visit.    2. Bone health:  Given Ms. Heidrick's age/history of breast cancer and her current treatment regimen including anti-estrogen therapy with Anastrozole, she is at risk for bone demineralization.  Her last DEXA scan was 02/26/2018, which was normal.  She is due for repeat in 01/2020.  In the meantime, she was encouraged to increase her consumption of foods rich in calcium, as well as increase her weight-bearing activities.  She was given education on specific activities to promote bone health.  3. Cancer screening:  Due to Ms. Suttles's history  and her age, she should receive screening for skin cancers, colon cancer.  She is a current every day smoker and has 25 pack years.  She does not quite meet criteria for lung cancer screening.  The information and recommendations are listed on the patient's comprehensive care plan/treatment summary and were reviewed in detail with the patient.    4. Health maintenance and wellness promotion: Ms. Illingworth was encouraged to consume 5-7 servings of fruits and vegetables per day. We reviewed the "Nutrition Rainbow" handout, as well as the handout "Take Control of Your Health and Reduce Your Cancer Risk" from the [place].  She was also encouraged to engage in moderate to vigorous exercise for 30 minutes per day most days of the week. We discussed the [employer] fitness program, which is designed for cancer survivors to help them become more physically fit after cancer treatments.  She was instructed to limit her alcohol consumption and was encouraged stop smoking.     5. Support services/counseling: It is not uncommon for this period of the patient's cancer care trajectory to be one of many emotions and stressors.  We discussed how this can be increasingly difficult during the times of quarantine and social distancing due to the COVID-19 pandemic.   She was given information regarding our available services and encouraged to contact me with any questions or for help enrolling in any of our support group/programs.    Follow up instructions:    -Return to cancer center for f/u with Dr. Jana Hakim in 06/2019  -Mammogram due in 01/2020 -Bone density due in 01/2020 -Follow up with surgery 11/2019 -She is welcome to return back to the Survivorship Clinic at any time; no additional follow-up needed at this time.  -Consider referral back to survivorship as a long-term survivor for continued surveillance  The patient was provided an opportunity to ask questions  and all were answered. The patient  agreed with the plan and demonstrated an understanding of the instructions.   The patient was advised to call back or seek an in-person evaluation if the symptoms worsen or if the condition fails to improve as anticipated.   I provided 14 minutes of non face-to-face telephone visit time during this encounter, and > 50% was spent counseling as documented under my assessment & plan.  Scot Dock, NP

## 2019-04-10 ENCOUNTER — Other Ambulatory Visit: Payer: Self-pay | Admitting: *Deleted

## 2019-04-10 MED ORDER — ATORVASTATIN CALCIUM 40 MG PO TABS: 40.0000 mg | ORAL_TABLET | Freq: Every day | ORAL | 2 refills | Status: DC

## 2019-04-10 MED ORDER — PRIMIDONE 50 MG PO TABS: 100.0000 mg | ORAL_TABLET | Freq: Every morning | ORAL | 2 refills | Status: DC

## 2019-04-10 MED ORDER — PROPRANOLOL HCL 40 MG PO TABS: 20.0000 mg | ORAL_TABLET | Freq: Two times a day (BID) | ORAL | 2 refills | Status: DC

## 2019-04-15 ENCOUNTER — Other Ambulatory Visit: Payer: Self-pay

## 2019-04-15 ENCOUNTER — Ambulatory Visit (INDEPENDENT_AMBULATORY_CARE_PROVIDER_SITE_OTHER): Payer: Medicare HMO | Admitting: Internal Medicine

## 2019-04-15 ENCOUNTER — Encounter: Payer: Self-pay | Admitting: Internal Medicine

## 2019-04-15 VITALS — BP 124/71 | HR 70 | Temp 97.7°F | Ht 69.0 in | Wt 176.0 lb

## 2019-04-15 DIAGNOSIS — E785 Hyperlipidemia, unspecified: Secondary | ICD-10-CM

## 2019-04-15 DIAGNOSIS — I10 Essential (primary) hypertension: Secondary | ICD-10-CM | POA: Diagnosis not present

## 2019-04-15 NOTE — Progress Notes (Signed)
OFFICE NOTE  Chief Complaint:  Routine follow-up  Primary Care Physician: Alycia Rossetti, MD  HPI:  Catherine Lowery returns today for hospital follow-up. She presented with atypical chest pain symptoms. She ruled-out for ACS and underwent a myoview, which was negative for ischemia, however, the EF was reduced to 47%. It should also be noted, that she was found to have a suppressed TSH at 0.045.  She has had no treatment for this. She reports no further chest pain today. She denies worsening shortness of breath. She has had worsening tremor. She gets hot flashes a lot and has been sweaty.   Catherine Lowery returns today for follow-up. Overall she feels well. She denies any significant palpitations on propranolol. She continues to take aspirin 325 mg daily. Blood pressure is well-controlled. She is seeing Dr. Cruzita Lederer for workup of a suppressed TSH however T3 and T4 have been fairly normal. Laboratory work for graves disease and other possible etiologies are still in the works. She's not currently on any medication.  03/29/2016  Catherine Lowery was seen today in follow-up. Overall she feels well. She recently saw her primary care provider who noted her total cholesterol was elevated 209 with LDL of 125. EKG shows sinus bradycardia with nonspecific T-wave changes. Blood pressure is at goal today. She has follow-up with her endocrinologist in December for further monitoring of her hyperthyroidism.  03/30/2017  Catherine Lowery returns today for follow-up.  This is a routine annual visit.  She denies any chest pain or worsening shortness of breath.  Lab work in July 2018 showed total cholesterol 148, triglycerides 156, HDL 30 and LDL 87, an improvement from an LDL of 121 about 2 years ago.  She is on atorvastatin 40 mg daily.  04/15/2019  Catherine Lowery is seen today in follow-up.  Overall she is doing well.  She seems to be in remission from breast cancer.  This required 2 surgeries and radiation  and she is now on Arimidex.  She has some hot flashes but generally feels pretty well.  There was some fatigue but it is improved with B12.  Blood pressures well controlled.  August 2020 or also well controlled with total cholesterol 121, HDL 31, triglycerides 87 and LDL 73.  She denies any chest pain or shortness of breath.  PMHx:  Past Medical History:  Diagnosis Date  . Cancer ([place]) 03/2018   right breast cancer  . Chronic kidney disease    CKD  . DJD (degenerative joint disease)    LEFT HIP  . Family history of breast cancer   . Family history of ovarian cancer   . Family history of prostate cancer   . Family history of prostate cancer   . Hyperlipidemia    takes Simvasatin daily  . Hypertension    takes Propranolol and HCTZ daioly  . Hyperthyroidism   . Pneumonia 10/2015  . PONV (postoperative nausea and vomiting)    when ether used  . Shortness of breath dyspnea    with ambulation  . Tremors of nervous system    takes Primidone daily    Past Surgical History:  Procedure Laterality Date  . BOne Spur     left heel  . BREAST LUMPECTOMY WITH RADIOACTIVE SEED AND SENTINEL LYMPH NODE BIOPSY Right 05/07/2018   Procedure: RIGHT BREAST LUMPECTOMY WITH RADIOACTIVE SEED AND RIGHT  SENTINEL LYMPH NODE MAPPING;  Surgeon: [name], MD;  Location: [place];  Service: General;  Laterality: Right;  .  COLONOSCOPY    . JOINT REPLACEMENT Bilateral    knee  . KNEE SURGERY    . RE-EXCISION OF BREAST LUMPECTOMY Right 06/05/2018   Procedure: RE-EXCISION OF RIGHT BREAST LUMPECTOMY;  Surgeon: [name], MD;  Location: [place];  Service: General;  Laterality: Right;  . ROTATOR CUFF REPAIR Bilateral   . 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 TOTAL HIP ARTHROPLASTY Left 12/29/2015   Procedure: TOTAL HIP ARTHROPLASTY ANTERIOR APPROACH;  Surgeon: Ninetta Lights, MD;  Location: [place];  Service: Orthopedics;  Laterality: Left;    FAMHx:  Family  History  Problem Relation Age of Onset  . Hypertension Mother   . Heart disease Mother   . Stroke Mother   . Multiple myeloma Father 49  . Thyroid cancer Sister        dx 40's/50's  . Cancer Paternal Aunt        unknown  . Prostate cancer Paternal Uncle        dx >50- cancer was the cause of his death  . Ovarian cancer Paternal Aunt   . Throat cancer Paternal Uncle   . Breast cancer Cousin        dx >50  . Cancer Cousin        type unk  . Cancer Cousin        type unk  . Cancer Cousin        type unk    SOCHx:   reports that she has been smoking. She has a 10.00 pack-year smoking history. She has never used smokeless tobacco. She reports that she does not drink alcohol or use drugs.  ALLERGIES:  Allergies  Allergen Reactions  . Morphine And Related Itching  . Other Itching and Nausea And Vomiting    "PAIN MEDICINES"  . Percocet [Oxycodone-Acetaminophen] Nausea And Vomiting    ROS: Pertinent items noted in HPI and remainder of comprehensive ROS otherwise negative.  HOME MEDS: Current Outpatient Medications  Medication Sig Dispense Refill  . amLODipine (NORVASC) 5 MG tablet Take 10 mg by mouth daily.     Marland Kitchen anastrozole (ARIMIDEX) 1 MG tablet Take 1 tablet (1 mg total) by mouth daily. 90 tablet 4  . aspirin 325 MG tablet Take 325 mg by mouth daily. Stop date 01/30/16    . atorvastatin (LIPITOR) 40 MG tablet Take 1 tablet (40 mg total) by mouth daily. 90 tablet 2  . CVS D3 50 MCG (2000 UT) CAPS TAKE 1 CAPSULE BY MOUTH EVERY DAY 300 capsule 3  . ibuprofen (ADVIL,MOTRIN) 800 MG tablet Take 1 tablet (800 mg total) by mouth every 8 (eight) hours as needed. 30 tablet 0  . Omega-3 Fatty Acids (FISH OIL) 1000 MG CPDR Take by mouth.    . primidone (MYSOLINE) 50 MG tablet Take 2 tablets (100 mg total) by mouth every morning. 180 tablet 2  . propranolol (INDERAL) 40 MG tablet Take 0.5 tablets (20 mg total) by mouth 2 (two) times daily. 180 tablet 2  . VOLTAREN 1 % GEL APPLY  TOPICALLY 4 TIMES DAILY AS NEEDED 100 g 0   No current facility-administered medications for this visit.    LABS/IMAGING: No results found for this or any previous visit (from the past 48 hour(s)). No results found.  WEIGHTS: Wt Readings from Last 3 Encounters:  04/15/19 176 lb (79.8 kg)  01/27/19 173 lb 11.2 oz (78.8 kg)  12/17/18 179 lb (81.2 kg)    VITALS: BP 124/71   Pulse 70  Temp 97.7 F (36.5 C)   Ht '5\' 9"'$  (1.753 m)   Wt 176 lb (79.8 kg)   LMP 01/24/2000   SpO2 97%   BMI 25.99 kg/m   EXAM: General appearance: alert and no distress Neck: no carotid bruit, no JVD and thyroid: enlarged Lungs: clear to auscultation bilaterally Heart: regular rate and rhythm, S1, S2 normal, no murmur, click, rub or gallop Abdomen: soft, non-tender; bowel sounds normal; no masses,  no organomegaly Extremities: extremities normal, atraumatic, no cyanosis or edema Pulses: 2+ and symmetric Skin: Skin color, texture, turgor normal. No rashes or lesions Neurologic: Mental status: Alert, oriented, thought content appropriate, fine motor tremor is noted Psych: Pleasant  EKG: Sinus rhythm with PVCs and nonspecific T wave changes at 71-personally reviewed  ASSESSMENT: 1. Atypical chest pain - negative myoview for ischemia (2016) 2. Normal LV function by echo, however EF 47% by Myoview (suspect gating abnormality) -12/2014, LVEF 55-60% by echo (01/2015) 3. Subclinical hyperthyroidism - recent radioactive iodine ablation, not on thyroid replacement 4. Dyslipidemia 5. Breast cancer, status post surgery/radiation/chemotherapy  PLAN: 1.   Catherine Lowery continues to do well without any chest pain or worsening shortness of breath.  She did recently have breast cancer and had 2 surgeries and radiation and now remains on Arimidex.  From a cardiac standpoint she is stable with normal blood pressure and well-controlled cholesterol.  I advised her to decrease aspirin from 325 mg to 81 mg daily to  reduce bleeding risks.  Additionally, she could stop her fish oil as there is no evidence for significant cardiovascular risk reduction.  Follow-up with me annually or sooner as necessary.  Pixie Casino, MD, [facility], [place] Director of the Advanced Lipid Disorders &  Cardiovascular Risk Reduction Clinic Attending Cardiologist  Direct Dial: [phone]  Fax: [phone]  Website:  [email or web address] Rahal 04/15/2019, 9:45 AM

## 2019-04-15 NOTE — Patient Instructions (Signed)
Medication Instructions:  STOP OMEGA 3 AND ASPIRIN 325MG . START TAKING ASPIRIN 81MG  1 TABLET DAILY *If you need a refill on your cardiac medications before your next appointment, please call your pharmacy*  Lab Work: NONE  Testing/Procedures: NONE AT THIS TIME   Follow-Up: At [facility], you and your health needs are our priority.  As part of our continuing mission to provide you with exceptional heart care, we have created designated Provider Care Teams.  These Care Teams include your primary Cardiologist (physician) and Advanced Practice Providers (APPs -  Physician Assistants and Nurse Practitioners) who all work together to provide you with the care you need, when you need it.  Your next appointment:   1 year(s)  The format for your next appointment:   Either In Person or Virtual  Provider:

## 2019-04-22 ENCOUNTER — Encounter: Payer: Medicare HMO | Admitting: Family Medicine

## 2019-05-07 ENCOUNTER — Other Ambulatory Visit: Payer: Self-pay | Admitting: Family Medicine

## 2019-05-19 ENCOUNTER — Encounter: Payer: Self-pay | Admitting: Family Medicine

## 2019-05-19 ENCOUNTER — Ambulatory Visit (INDEPENDENT_AMBULATORY_CARE_PROVIDER_SITE_OTHER): Payer: Medicare HMO | Admitting: Family Medicine

## 2019-05-19 ENCOUNTER — Other Ambulatory Visit: Payer: Self-pay

## 2019-05-19 VITALS — BP 128/68 | HR 68 | Temp 98.7°F | Resp 16 | Ht 69.0 in | Wt 176.0 lb

## 2019-05-19 DIAGNOSIS — E782 Mixed hyperlipidemia: Secondary | ICD-10-CM | POA: Diagnosis not present

## 2019-05-19 DIAGNOSIS — G250 Essential tremor: Secondary | ICD-10-CM | POA: Diagnosis not present

## 2019-05-19 DIAGNOSIS — Z0001 Encounter for general adult medical examination with abnormal findings: Secondary | ICD-10-CM | POA: Diagnosis not present

## 2019-05-19 DIAGNOSIS — Z66 Do not resuscitate: Secondary | ICD-10-CM

## 2019-05-19 DIAGNOSIS — I10 Essential (primary) hypertension: Secondary | ICD-10-CM

## 2019-05-19 DIAGNOSIS — M1612 Unilateral primary osteoarthritis, left hip: Secondary | ICD-10-CM

## 2019-05-19 DIAGNOSIS — Z0000 Encounter for general adult medical examination without abnormal findings: Secondary | ICD-10-CM

## 2019-05-19 DIAGNOSIS — E559 Vitamin D deficiency, unspecified: Secondary | ICD-10-CM

## 2019-05-19 NOTE — Patient Instructions (Signed)
F/u 6 months

## 2019-05-19 NOTE — Progress Notes (Signed)
Subjective:   Patient presents for Medicare Annual/Subsequent preventive examination.     Pt here for wellness visit she does not have any new concerns today.  States that she feels well.   Continues to follow with cardiology for her HTN- BP has been controlled   Endocrinology for her multinodular goiter/ subclinical Hyperthyroidism   she was reduced to ASA 81mg  and she was taken fish oil   Oncology visit next month- still on arimdex, had mammogram Oct       Review Past Medical/Family/Social: Per EMR   Risk Factors  Current exercise habits: walks Dietary issues discussed: Yes  Cardiac risk factors: HTn, HYPERLIPIDEMIA  Depression Screen  (Note: if answer to either of the following is "Yes", a more complete depression screening is indicated)  Over the past two weeks, have you felt down, depressed or hopeless? No Over the past two weeks, have you felt little interest or pleasure in doing things? No Have you lost interest or pleasure in daily life? No Do you often feel hopeless? No Do you cry easily over simple problems? No   Activities of Daily Living  In your present state of health, do you have any difficulty performing the following activities?:  Driving? No  Managing money? No  Feeding yourself? No  Getting from bed to chair? No  Climbing a flight of stairs? Yes   Preparing food and eating?: No  Bathing or showering? No  Getting dressed: No  Getting to the toilet? No  Using the toilet:No  Moving around from place to place: Yes - joint pain   In the past year have you fallen or had a near fall?:No  Are you sexually active? No  Do you have more than one partner? No   Hearing Difficulties: No  Do you often ask people to speak up or repeat themselves? No  Do you experience ringing or noises in your ears? No Do you have difficulty understanding soft or whispered voices? No  Do you feel that you have a problem with memory? No Do you often misplace items? No   Do you feel safe at home? Yes  Cognitive Testing  Alert? Yes Normal Appearance?Yes  Oriented to person? Yes Place? Yes  Time? Yes  Recall of three objects? Yes  Can perform simple calculations? 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 Royce Macadamia    Screening Tests / Date Colonoscopy       UTD 2015               Zostavax  Declines Mammogram  UTD  Oct 2020 Influenza Vaccine UTD Tetanus/tdap Declines due to cost  Pneumonia- UTD Bone density UTD 2019   ROS: GEN- denies fatigue, fever, weight loss,weakness, recent illness HEENT- denies eye drainage, change in vision, nasal discharge, CVS- denies chest pain, palpitations RESP- denies SOB, cough, wheeze ABD- denies N/V, change in stools, abd pain GU- denies dysuria, hematuria, dribbling, incontinence MSK- denies joint pain, muscle aches, injury Neuro- denies headache, dizziness, syncope, seizure activity   PHYSICAL: GEN- NAD, alert and oriented x3 HEENT- PERRL, EOMI, non injected sclera, pink conjunctiva, MMM, oropharynx clear, TM clear bilat no effusion Neck- Supple, no thryomegaly CVS- RRR, no murmur RESP-CTAB ABD-NABS, Soft, NT, ND EXT- TRACE Ankle edema Pulses- Radial, DP- 2+   Assessment:  Annual wellness medicare exam   Plan:    During the course of the visit the patient was educated and counseled about appropriate screening and preventive services including:   Fall/auditC/ Depression screening negative   Tobacco user- she has cut down to 10 cig per day, will try cutting down to 8 a day    HYperlipidemia due for repeat lipids , on lipitor    HTN- on propranolol , also helps a little  with tremor    Vitamin D def- due for repeat level, on calcium and  viamin D    Appt scheduled with [facility]   CKD stage III    Sister- Thelma Barge is POA/ on living will     DNR / No feeding tubes    Fasting labs done today  Handicap placard given          Diet review for nutrition referral? Yes ____ Not Indicated __x__  Patient Instructions (the written plan) was given to the patient.  Medicare Attestation  I have personally reviewed:  The patient's medical and social history  Their use of alcohol, tobacco or illicit drugs  Their current medications and supplements  The patient's functional ability including ADLs,fall risks, home safety risks, cognitive, and hearing and visual impairment  Diet and physical activities  Evidence for depression or mood disorders  The patient's weight, height, BMI, and visual acuity have been recorded in the chart. I have made referrals, counseling, and provided education to the patient based on review of the above and I have provided the patient with a written personalized care plan for preventive services.

## 2019-05-20 ENCOUNTER — Encounter: Payer: Self-pay | Admitting: *Deleted

## 2019-05-20 LAB — CBC WITH DIFFERENTIAL/PLATELET
Absolute Monocytes: 536 cells/uL (ref 200–950)
Basophils Absolute: 68 cells/uL (ref 0–200)
Basophils Relative: 1.5 %
Eosinophils Absolute: 279 cells/uL (ref 15–500)
Eosinophils Relative: 6.2 %
HCT: 36.8 % (ref 35.0–45.0)
Hemoglobin: 11.8 g/dL (ref 11.7–15.5)
Lymphs Abs: 1395 cells/uL (ref 850–3900)
MCH: 23.4 pg — ABNORMAL LOW (ref 27.0–33.0)
MCHC: 32.1 g/dL (ref 32.0–36.0)
MCV: 73 fL — ABNORMAL LOW (ref 80.0–100.0)
MPV: 10.2 fL (ref 7.5–12.5)
Monocytes Relative: 11.9 %
Neutro Abs: 2223 cells/uL (ref 1500–7800)
Neutrophils Relative %: 49.4 %
Platelets: 190 10*3/uL (ref 140–400)
RBC: 5.04 10*6/uL (ref 3.80–5.10)
RDW: 15.3 % — ABNORMAL HIGH (ref 11.0–15.0)
Total Lymphocyte: 31 %
WBC: 4.5 10*3/uL (ref 3.8–10.8)

## 2019-05-20 LAB — LIPID PANEL
Cholesterol: 136 mg/dL (ref <200)
HDL: 32 mg/dL — ABNORMAL LOW (ref >=50)
LDL Cholesterol (Calc): 81 mg/dL (calc)
Non-HDL Cholesterol (Calc): 104 mg/dL (calc) (ref <130)
Total CHOL/HDL Ratio: 4.3 (calc) (ref <5.0)
Triglycerides: 129 mg/dL (ref <150)

## 2019-05-20 LAB — COMPREHENSIVE METABOLIC PANEL
AG Ratio: 1.4 (calc) (ref 1.0–2.5)
ALT: 17 U/L (ref 6–29)
AST: 16 U/L (ref 10–35)
Albumin: 4 g/dL (ref 3.6–5.1)
Alkaline phosphatase (APISO): 71 U/L (ref 37–153)
BUN/Creatinine Ratio: 15 (calc) (ref 6–22)
BUN: 16 mg/dL (ref 7–25)
CO2: 25 mmol/L (ref 20–32)
Calcium: 9.1 mg/dL (ref 8.6–10.4)
Chloride: 107 mmol/L (ref 98–110)
Creat: 1.05 mg/dL — ABNORMAL HIGH (ref 0.60–0.93)
Globulin: 2.8 g/dL (calc) (ref 1.9–3.7)
Glucose, Bld: 92 mg/dL (ref 65–99)
Potassium: 4.3 mmol/L (ref 3.5–5.3)
Sodium: 140 mmol/L (ref 135–146)
Total Bilirubin: 0.5 mg/dL (ref 0.2–1.2)
Total Protein: 6.8 g/dL (ref 6.1–8.1)

## 2019-05-20 LAB — VITAMIN D 25 HYDROXY (VIT D DEFICIENCY, FRACTURES): Vit D, 25-Hydroxy: 61 ng/mL (ref 30–100)

## 2019-06-19 ENCOUNTER — Inpatient Hospital Stay: Payer: Medicare HMO | Attending: Oncology

## 2019-06-19 DIAGNOSIS — Z791 Long term (current) use of non-steroidal anti-inflammatories (NSAID): Secondary | ICD-10-CM | POA: Insufficient documentation

## 2019-06-19 DIAGNOSIS — Z923 Personal history of irradiation: Secondary | ICD-10-CM | POA: Insufficient documentation

## 2019-06-19 DIAGNOSIS — Z79899 Other long term (current) drug therapy: Secondary | ICD-10-CM | POA: Insufficient documentation

## 2019-06-19 DIAGNOSIS — Z8249 Family history of ischemic heart disease and other diseases of the circulatory system: Secondary | ICD-10-CM | POA: Insufficient documentation

## 2019-06-19 DIAGNOSIS — N189 Chronic kidney disease, unspecified: Secondary | ICD-10-CM | POA: Insufficient documentation

## 2019-06-19 DIAGNOSIS — Z170 Estrogen receptor positive status [ER+]: Secondary | ICD-10-CM | POA: Insufficient documentation

## 2019-06-19 DIAGNOSIS — I10 Essential (primary) hypertension: Secondary | ICD-10-CM | POA: Insufficient documentation

## 2019-06-19 DIAGNOSIS — Z801 Family history of malignant neoplasm of trachea, bronchus and lung: Secondary | ICD-10-CM | POA: Insufficient documentation

## 2019-06-19 DIAGNOSIS — Z8349 Family history of other endocrine, nutritional and metabolic diseases: Secondary | ICD-10-CM | POA: Insufficient documentation

## 2019-06-19 DIAGNOSIS — E785 Hyperlipidemia, unspecified: Secondary | ICD-10-CM | POA: Insufficient documentation

## 2019-06-19 DIAGNOSIS — C50311 Malignant neoplasm of lower-inner quadrant of right female breast: Secondary | ICD-10-CM | POA: Insufficient documentation

## 2019-06-19 DIAGNOSIS — Z79811 Long term (current) use of aromatase inhibitors: Secondary | ICD-10-CM | POA: Insufficient documentation

## 2019-06-19 DIAGNOSIS — Z808 Family history of malignant neoplasm of other organs or systems: Secondary | ICD-10-CM | POA: Insufficient documentation

## 2019-06-19 DIAGNOSIS — E0590 Thyrotoxicosis, unspecified without thyrotoxic crisis or storm: Secondary | ICD-10-CM | POA: Insufficient documentation

## 2019-06-19 DIAGNOSIS — F17210 Nicotine dependence, cigarettes, uncomplicated: Secondary | ICD-10-CM | POA: Insufficient documentation

## 2019-06-19 DIAGNOSIS — Z7982 Long term (current) use of aspirin: Secondary | ICD-10-CM | POA: Insufficient documentation

## 2019-06-19 DIAGNOSIS — Z803 Family history of malignant neoplasm of breast: Secondary | ICD-10-CM | POA: Insufficient documentation

## 2019-06-19 DIAGNOSIS — Z8042 Family history of malignant neoplasm of prostate: Secondary | ICD-10-CM | POA: Insufficient documentation

## 2019-06-19 DIAGNOSIS — Z8041 Family history of malignant neoplasm of ovary: Secondary | ICD-10-CM | POA: Insufficient documentation

## 2019-06-22 NOTE — Progress Notes (Signed)
[facility]  Telephone:([phone] Fax:([phone]) [phone]     ID: Catherine Lowery DOB: [birth year]  MR#: [id number]  EUM#:[id number]  Patient Care Team: Catherine Rossetti, MD as PCP - General (Family Medicine) Catherine Pickett [name], MD as PCP - Cardiology (Cardiology) Catherine Lowery, Catherine Dad, MD as Consulting Physician (Oncology) Catherine Pray, MD as Consulting Physician (Radiation Oncology) Catherine Butters, MD as Attending Physician (Orthopedic Surgery) Catherine Luna, MD as Consulting Physician (General Surgery) Catherine Banister, MD as Attending Physician (Gastroenterology) Catherine Bombard, MD as Consulting Physician (Neurology) Catherine Emms, MD as Consulting Physician (Nephrology) OTHER MD:   CHIEF COMPLAINT: 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   She continues on anastrozole.  She is "feeling hot" more than having hot flashes.  It is a definite change though.  She is not having any other side effects from this medication.  Catherine Lowery's last bone density screening on 02/26/2018, showed a T-score of 1.4, which is considered normal.    Since her last visit, she underwent bilateral diagnostic mammography with tomography at [facility] on 02/18/2019 showing: breast density category B; no evidence of malignancy in either breast.    REVIEW OF SYSTEMS: Catherine Lowery was supposed to have met her lab work last week so we could discuss results today but the weather was horrible and she could not drive down.  So she had the labs drawn today and that means there will be a little bit of a delay before we have results.  She is doing normal activities, housework, shopping, but not otherwise exercising.  Unfortunately she is still smoking.  A detailed review of systems today was otherwise stable   HISTORY OF CURRENT ILLNESS:  From the original intake note:  Catherine Lowery had routine screening mammography on 02/26/2018 showing a possible abnormality in the right breast. She underwent unilateral right diagnostic mammography with tomography and right breast ultrasonography at [facility] on 03/07/2018 showing: Breast Density Category B. On mammography, there is a new 1.5 cm irregular architectural distortion with a spiculated margin in the right breast at 5 o'clock anterior depth. On ultrasound, there is a 2.7 cm irregular solid mass in the right breast at 5 o'clock middle depth. This irregular mass displays posterior acoustic shadowing. No significant abnormalities were seen sonographically in the right axilla.  Accordingly on 03/18/2018 she proceeded to biopsy of the right breast area in question. The pathology from this procedure showed ([id number]): Invasive mammary carcinoma e-cadherin negative, grade II-III. 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 Prognostic indicators significant for: estrogen receptor, 95% positive and progesterone receptor, 95% positive, both with strong staining intensity. Proliferation marker Ki67 at 5%. HER2 negative by immunohistochemistry (1+).  On 04/10/2018 the patient underwent bilateral breast MRI with and without contrast showing a 2.3 cm spiculated enhancing mass associated with non-masslike enhancement, the total abnormal area measuring 4.6 cm.  There was no evidence of lymph node abnormality and the contralateral breast was benign appearing  The patient's subsequent history is as detailed below.   PAST MEDICAL HISTORY: Past Medical History:  Diagnosis Date  . Cancer ([place]) 03/2018   right breast cancer  . Chronic kidney disease    CKD  . DJD (degenerative joint disease)    LEFT HIP  . Family history of breast cancer   . Family history of ovarian cancer   .  Family history of prostate cancer   . Family history of prostate cancer   . Hyperlipidemia    takes  Simvasatin daily  . Hypertension    takes Propranolol and HCTZ daioly  . Hyperthyroidism   . Pneumonia 10/2015  . PONV (postoperative nausea and vomiting)    when ether used  . Shortness of breath dyspnea    with ambulation  . Tremors of nervous system    takes Primidone daily    PAST SURGICAL HISTORY: Past Surgical History:  Procedure Laterality Date  . BOne Spur     left heel  . BREAST LUMPECTOMY WITH RADIOACTIVE SEED AND SENTINEL LYMPH NODE BIOPSY Right 05/07/2018   Procedure: RIGHT BREAST LUMPECTOMY WITH RADIOACTIVE SEED AND RIGHT  SENTINEL LYMPH NODE MAPPING;  Surgeon: Catherine Luna, MD;  Location: [facility];  Service: General;  Laterality: Right;  . COLONOSCOPY    . JOINT REPLACEMENT Bilateral    knee  . KNEE SURGERY    . RE-EXCISION OF BREAST LUMPECTOMY Right 06/05/2018   Procedure: RE-EXCISION OF RIGHT BREAST LUMPECTOMY;  Surgeon: Catherine Luna, MD;  Location: [facility];  Service: General;  Laterality: Right;  . ROTATOR CUFF REPAIR Bilateral   . 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 TOTAL HIP ARTHROPLASTY Left 12/29/2015   Procedure: TOTAL HIP ARTHROPLASTY ANTERIOR APPROACH;  Surgeon: Ninetta Lights, MD;  Location: [facility];  Service: Orthopedics;  Laterality: Left;    FAMILY HISTORY Family History  Problem Relation Age of Onset  . Hypertension Mother   . Heart disease Mother   . Stroke Mother   . Multiple myeloma Father 85  . Thyroid cancer Sister        dx 40's/50's  . Cancer Paternal Aunt        unknown  . Prostate cancer Paternal Uncle        dx >50- cancer was the cause of his death  . Ovarian cancer Paternal Aunt   . Throat cancer Paternal Uncle   . Breast cancer Cousin        dx >50  . Cancer Cousin        type unk  . Cancer Cousin        type unk  . Cancer Cousin        type unk   She notes that her father died from a multiple myeloma at age 58. Patients' mother died from stroke at age 77. The patient has 1  brother and 2 sisters. One paternal aunt had ovarian cancer.  One cousin had breast cancer, and her 16s. Another paternal aunt had cancer, type unknown to the patient, and a paternal uncle had prostate cancer.    GYNECOLOGIC HISTORY:  Menarche: 74 years old Age at first live birth: 74 years old GX P: 1 LMP: Patient's last menstrual period was 01/24/2000. 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 Before that, she has been employed at [employer], [employer], and as an elderly caregiver. 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 She is separated, and has not spoken to her husband, Catherine Lowery, in over 10 years. She lives with her only son, Catherine Lowery, who is disabled secondary to SCD and multiple surgeries. She has no pets. She attends [employer].  ADVANCED DIRECTIVES: Her sister, Catherine Lowery, is Hermie's medical power of attorney. Catherine Lowery can be reached at [phone]. Catherine Lowery has stated that her husband, Catherine Lowery, is not to have access to her medical records or to make decisions on her behalf. ]   HEALTH MAINTENANCE: Social History   Tobacco Use  . Smoking status: Current Every Day Smoker    Packs/day: 0.50    Years: 20.00    Pack years: 10.00  . Smokeless tobacco: Never Used  . Tobacco comment: 06/06/16 Pt stated "I'm trying to quit"  Substance Use Topics  . Alcohol use: No    Alcohol/week: 0.0 standard drinks  . Drug use: No     Colonoscopy: 01/2015  PAP:   Bone density:  02/26/2018: T-score 1.4   Allergies  Allergen Reactions  . Morphine And Related Itching  . Other Itching and Nausea And Vomiting    "PAIN MEDICINES"  . Percocet [Oxycodone-Acetaminophen] Nausea And Vomiting    Current Outpatient Medications  Medication Sig Dispense Refill  .  amLODipine (NORVASC) 10 MG tablet Take 10 mg by mouth daily.    Marland Kitchen anastrozole (ARIMIDEX) 1 MG tablet Take 1 tablet (1 mg total) by mouth daily. 90 tablet 4  . aspirin EC 81 MG tablet Take 81 mg by mouth daily.    Marland Kitchen atorvastatin (LIPITOR) 40 MG tablet Take 1 tablet (40 mg total) by mouth daily. 90 tablet 2  . [facility] D3 50 MCG (2000 UT) CAPS TAKE 1 CAPSULE BY MOUTH EVERY DAY 300 capsule 3  . ibuprofen (ADVIL) 200 MG tablet Take 200 mg by mouth every 6 (six) hours as needed.    . primidone (MYSOLINE) 50 MG tablet Take 2 tablets (100 mg total) by mouth every morning. 180 tablet 2  . propranolol (INDERAL) 40 MG tablet Take 0.5 tablets (20 mg total) by mouth 2 (two) times daily. 180 tablet 2  . VOLTAREN 1 % GEL APPLY TOPICALLY 4 TIMES DAILY AS NEEDED 100 g 0   No current facility-administered medications for this visit.     OBJECTIVE: Middle-aged African-American woman who appears well  Vitals:   06/23/19 1509  BP: 138/64  Pulse: (!) 53  Resp: 18  Temp: 98.5 F (36.9 C)  SpO2: 99%   Wt Readings from Last 3 Encounters:  06/23/19 177 lb 3.2 oz (80.4 kg)  05/19/19 176 lb (79.8 kg)  04/15/19 176 lb (79.8 kg)   Body mass index is 26.17 kg/m.    ECOG FS:1 - Symptomatic but completely ambulatory  Sclerae unicteric, EOMs intact Wearing a mask No cervical or supraclavicular adenopathy Lungs show bilateral basilar crackles, no wheezes Heart regular rate and rhythm Abd soft, nontender, positive bowel sounds MSK no focal spinal tenderness, no upper extremity lymphedema Neuro: nonfocal, well oriented, appropriate affect Breasts: The right breast is status post lumpectomy and radiation.  There is still some hyperpigmentation and mild distortion of the contour.  There is no evidence of local recurrence.  Left breast is benign.  Both axillae are benign.   LAB RESULTS:  CMP     Component Value Date/Time   NA 140 05/19/2019 1034   K 4.3 05/19/2019 1034   CL 107 05/19/2019 1034   CO2 25  05/19/2019 1034   GLUCOSE 92 05/19/2019 1034   BUN 16 05/19/2019 1034   CREATININE 1.05 (H) 05/19/2019 1034   CALCIUM 9.1 05/19/2019 1034   PROT 6.8 05/19/2019 1034   ALBUMIN 3.9 01/27/2019 1338   AST 16 05/19/2019 1034  ALT 17 05/19/2019 1034   ALKPHOS 73 01/27/2019 1338   BILITOT 0.5 05/19/2019 1034   GFRNONAA 44 (L) 01/27/2019 1338   GFRNONAA 48 (L) 01/23/2018 0804   GFRAA 51 (L) 01/27/2019 1338   GFRAA 56 (L) 01/23/2018 0804    No results found for: TOTALPROTELP, ALBUMINELP, A1GS, A2GS, BETS, BETA2SER, GAMS, MSPIKE, SPEI  No results found for: KPAFRELGTCHN, LAMBDASER, KAPLAMBRATIO  Lab Results  Component Value Date   WBC 4.9 06/23/2019   NEUTROABS 2.8 06/23/2019   HGB 11.9 (L) 06/23/2019   HCT 36.4 06/23/2019   MCV 71.7 (L) 06/23/2019   PLT 199 06/23/2019   No results found for: LABCA2  No components found for: XBLTJQ300  No results for input(s): INR in the last 168 hours.  No results found for: LABCA2  No results found for: PQZ300  No results found for: TMA263  No results found for: FHL456  No results found for: CA2729  No components found for: HGQUANT  No results found for: CEA1 / No results found for: CEA1   No results found for: AFPTUMOR  No results found for: CHROMOGRNA  No results found for: HGBA, HGBA2QUANT, HGBFQUANT, HGBSQUAN (Hemoglobinopathy evaluation)   No results found for: LDH  No results found for: IRON, TIBC, IRONPCTSAT (Iron and TIBC)  No results found for: FERRITIN  Urinalysis    Component Value Date/Time   COLORURINE YELLOW 09/21/2017 1533   APPEARANCEUR CLOUDY (A) 09/21/2017 1533   LABSPEC 1.015 09/21/2017 1533   PHURINE 5.5 09/21/2017 1533   GLUCOSEU NEGATIVE 09/21/2017 1533   HGBUR TRACE (A) 09/21/2017 1533   BILIRUBINUR SMALL (A) 03/24/2011 1054   KETONESUR NEGATIVE 09/21/2017 1533   PROTEINUR NEGATIVE 09/21/2017 1533   UROBILINOGEN 0.2 03/24/2011 1054   NITRITE NEGATIVE 09/21/2017 1533   LEUKOCYTESUR TRACE (A)  09/21/2017 1533    STUDIES:  No results found.   ELIGIBLE FOR AVAILABLE RESEARCH PROTOCOL: no   ASSESSMENT: 74 y.o. [employer], [place] woman status post right breast lower inner quadrant biopsy 03/18/2018 for a clinical T2N0, [name], grade 2 or 3, estrogen and progesterone receptor positive, [name], with an MIB-1 of 5%  (1) Genetic testing performed through [facility] Common Hereditary Cancers Panel + Thyroid Cancer Panel on 05/09/2018 showing no deleterious mutations APC, [name], [name], [name], [name], [name], [name], [name], [name], [name], [name], [name], [name], [name], [name], [name], [name], [name], [name], [name], [name], KIT, MEN1, [name], [name], [name], [name], [name], [name], [name], [name], [name], [name], [name], [name], [name], POLE, [name], [name], RAD50, [name], [name], [name], [name], [name], [name], [name], [name], [name], [name], [name], [name], [name], [name], [name], VHL.   (a) Two variants of uncertain significance in the genes [name] c.1176C>T (Silent) and [name] c.1419+4C>T (Intronic) were identified.  (2) right lumpectomy and sentinel lymph node sampling 05/07/2018 showed a pT2 pN1, [name], grade 2, with a positive inferior margin  (a) reexcision scheduled for 06/05/2018  (3) MammaPrint obtained from the definitive surgical sample read as "low risk" predicting a disease-free survival at 5 years of 97.8% without need of chemotherapy.  (4) adjuvant radiation 07/11/2018 - 08/27/2018  (a) 1. Right breast; [name]; 25 fractions of 1.8 Gy for a total of 45 Gy  3. Boost; 6 fractions of 2 Gy for a total of 12 Gy  (5) anastrozole started 10/02/2018  (a) bone density 10/29/219 at [facility] shows a T scoe of +1.4 (normal)  (6) genetic testing 05/18/2018 through the Common Hereditary Cancers Panel + Thyroid [place] panel showed no deleterious mutations  in APC, [name], [name], [name], [name], [name], [name], [name], [name], [name], [name], [name], [name], [name], [name], [name], [name], [name], [name], [name], [name], KIT, MEN1, [name], [name], [name], [name], [name], [name], [name], [name], [name], [name], [name], [name], [name], POLE, [name], [name], RAD50, [name], [name], [name], [name], [name], [name], [name], [name], [name], [name], [name], [name], [name], [name], [name], VHL.  (a) 2 variants of uncertain significance in the genes [name] c.1176C>T (Silent) and [name] c.1419+4C>T (Intronic) were identified.    (b) UPDATE: The [name] c.1419+4C>T (Intronic) VUS has been reclassified to "Likely Benign." The report date is 08/08/2018.  (7) thalassemia: confirmatory labs pending    PLAN: Moncia is now just over a year out from definitive surgery for her breast cancer with no evidence of disease recurrence.  This is favorable.  She is tolerating anastrozole well and the plan will be to continue that a total of 5 years.  We discussed her mammogram from last year and she has breast density category B which is also good.  She will be due for repeat mammography October of this year and we will repeat a bone density at that time.  I will see her in October and then yearly from that point.  We should have the results of all her lab work today within a week or so and I will send her a note with those results but there is very difficult to explain without the patient being present so we will discuss them again at her next visit.  She would like to continue the radio Plex a little longer and I have put in the prescription for her.  Total encounter time 25 minutes.*  Raja Liska, Catherine Dad, MD  06/23/19 3:37 PM Medical Oncology and Hematology [facility] [place], [place] [address] Tel. [phone]    Fax. [phone]   I, Wilburn Mylar, am acting as scribe for Dr. Virgie Lowery. Catherine Lowery.  I, [name] MD, have reviewed the above documentation for accuracy  and completeness, and I agree with the above.    *Total Encounter Time as defined by the Centers for Medicare and Medicaid Services includes, in addition to the face-to-face time of a patient visit (documented in the note above) non-face-to-face time: obtaining and reviewing outside history, ordering and reviewing medications, tests or procedures, care coordination (communications with other health care professionals or caregivers) and documentation in the medical record.

## 2019-06-23 ENCOUNTER — Inpatient Hospital Stay: Payer: Medicare HMO

## 2019-06-23 ENCOUNTER — Inpatient Hospital Stay (HOSPITAL_BASED_OUTPATIENT_CLINIC_OR_DEPARTMENT_OTHER): Payer: Medicare HMO | Admitting: Oncology

## 2019-06-23 ENCOUNTER — Other Ambulatory Visit: Payer: Self-pay

## 2019-06-23 VITALS — BP 138/64 | HR 53 | Temp 98.5°F | Resp 18 | Ht 69.0 in | Wt 177.2 lb

## 2019-06-23 DIAGNOSIS — E785 Hyperlipidemia, unspecified: Secondary | ICD-10-CM | POA: Diagnosis not present

## 2019-06-23 DIAGNOSIS — Z720 Tobacco use: Secondary | ICD-10-CM | POA: Diagnosis not present

## 2019-06-23 DIAGNOSIS — Z79811 Long term (current) use of aromatase inhibitors: Secondary | ICD-10-CM | POA: Diagnosis not present

## 2019-06-23 DIAGNOSIS — F17210 Nicotine dependence, cigarettes, uncomplicated: Secondary | ICD-10-CM | POA: Diagnosis not present

## 2019-06-23 DIAGNOSIS — C50311 Malignant neoplasm of lower-inner quadrant of right female breast: Secondary | ICD-10-CM | POA: Diagnosis not present

## 2019-06-23 DIAGNOSIS — Z8249 Family history of ischemic heart disease and other diseases of the circulatory system: Secondary | ICD-10-CM | POA: Diagnosis not present

## 2019-06-23 DIAGNOSIS — Z808 Family history of malignant neoplasm of other organs or systems: Secondary | ICD-10-CM | POA: Diagnosis not present

## 2019-06-23 DIAGNOSIS — Z170 Estrogen receptor positive status [ER+]: Secondary | ICD-10-CM

## 2019-06-23 DIAGNOSIS — Z79899 Other long term (current) drug therapy: Secondary | ICD-10-CM | POA: Diagnosis not present

## 2019-06-23 DIAGNOSIS — E0520 Thyrotoxicosis with toxic multinodular goiter without thyrotoxic crisis or storm: Secondary | ICD-10-CM | POA: Diagnosis not present

## 2019-06-23 DIAGNOSIS — Z923 Personal history of irradiation: Secondary | ICD-10-CM | POA: Diagnosis not present

## 2019-06-23 DIAGNOSIS — Z8349 Family history of other endocrine, nutritional and metabolic diseases: Secondary | ICD-10-CM | POA: Diagnosis not present

## 2019-06-23 DIAGNOSIS — E559 Vitamin D deficiency, unspecified: Secondary | ICD-10-CM | POA: Diagnosis not present

## 2019-06-23 DIAGNOSIS — Z791 Long term (current) use of non-steroidal anti-inflammatories (NSAID): Secondary | ICD-10-CM | POA: Diagnosis not present

## 2019-06-23 DIAGNOSIS — Z8042 Family history of malignant neoplasm of prostate: Secondary | ICD-10-CM | POA: Diagnosis not present

## 2019-06-23 DIAGNOSIS — E0590 Thyrotoxicosis, unspecified without thyrotoxic crisis or storm: Secondary | ICD-10-CM | POA: Diagnosis not present

## 2019-06-23 DIAGNOSIS — N189 Chronic kidney disease, unspecified: Secondary | ICD-10-CM | POA: Diagnosis not present

## 2019-06-23 DIAGNOSIS — Z803 Family history of malignant neoplasm of breast: Secondary | ICD-10-CM | POA: Diagnosis not present

## 2019-06-23 DIAGNOSIS — E782 Mixed hyperlipidemia: Secondary | ICD-10-CM | POA: Diagnosis not present

## 2019-06-23 DIAGNOSIS — I10 Essential (primary) hypertension: Secondary | ICD-10-CM | POA: Diagnosis not present

## 2019-06-23 DIAGNOSIS — Z801 Family history of malignant neoplasm of trachea, bronchus and lung: Secondary | ICD-10-CM | POA: Diagnosis not present

## 2019-06-23 DIAGNOSIS — Z8041 Family history of malignant neoplasm of ovary: Secondary | ICD-10-CM | POA: Diagnosis not present

## 2019-06-23 DIAGNOSIS — Z7982 Long term (current) use of aspirin: Secondary | ICD-10-CM | POA: Diagnosis not present

## 2019-06-23 LAB — COMPREHENSIVE METABOLIC PANEL
ALT: 18 U/L (ref 0–44)
AST: 17 U/L (ref 15–41)
Albumin: 3.8 g/dL (ref 3.5–5.0)
Alkaline Phosphatase: 84 U/L (ref 38–126)
Anion gap: 9 (ref 5–15)
BUN: 14 mg/dL (ref 8–23)
CO2: 25 mmol/L (ref 22–32)
Calcium: 8.9 mg/dL (ref 8.9–10.3)
Chloride: 107 mmol/L (ref 98–111)
Creatinine, Ser: 1.01 mg/dL — ABNORMAL HIGH (ref 0.44–1.00)
GFR calc Af Amer: >60 mL/min (ref >60)
GFR calc non Af Amer: 54 mL/min — ABNORMAL LOW (ref >60)
Glucose, Bld: 88 mg/dL (ref 70–99)
Potassium: 4 mmol/L (ref 3.5–5.1)
Sodium: 141 mmol/L (ref 135–145)
Total Bilirubin: 0.3 mg/dL (ref 0.3–1.2)
Total Protein: 7.1 g/dL (ref 6.5–8.1)

## 2019-06-23 LAB — CBC WITH DIFFERENTIAL/PLATELET
Abs Immature Granulocytes: 0.02 10*3/uL (ref 0.00–0.07)
Basophils Absolute: 0.1 10*3/uL (ref 0.0–0.1)
Basophils Relative: 1 %
Eosinophils Absolute: 0.2 10*3/uL (ref 0.0–0.5)
Eosinophils Relative: 4 %
HCT: 36.4 % (ref 36.0–46.0)
Hemoglobin: 11.9 g/dL — ABNORMAL LOW (ref 12.0–15.0)
Immature Granulocytes: 0 %
Lymphocytes Relative: 29 %
Lymphs Abs: 1.4 10*3/uL (ref 0.7–4.0)
MCH: 23.4 pg — ABNORMAL LOW (ref 26.0–34.0)
MCHC: 32.7 g/dL (ref 30.0–36.0)
MCV: 71.7 fL — ABNORMAL LOW (ref 80.0–100.0)
Monocytes Absolute: 0.4 10*3/uL (ref 0.1–1.0)
Monocytes Relative: 9 %
Neutro Abs: 2.8 10*3/uL (ref 1.7–7.7)
Neutrophils Relative %: 57 %
Platelets: 199 10*3/uL (ref 150–400)
RBC: 5.08 MIL/uL (ref 3.87–5.11)
RDW: 15.3 % (ref 11.5–15.5)
WBC: 4.9 10*3/uL (ref 4.0–10.5)
nRBC: 0 % (ref 0.0–0.2)

## 2019-06-23 LAB — IRON AND TIBC
Iron: 57 ug/dL (ref 41–142)
Saturation Ratios: 23 % (ref 21–57)
TIBC: 248 ug/dL (ref 236–444)
UIBC: 190 ug/dL (ref 120–384)

## 2019-06-23 LAB — RETICULOCYTES
Immature Retic Fract: 11.6 % (ref 2.3–15.9)
RBC.: 5.11 MIL/uL (ref 3.87–5.11)
Retic Count, Absolute: 70.5 10*3/uL (ref 19.0–186.0)
Retic Ct Pct: 1.4 % (ref 0.4–3.1)

## 2019-06-23 LAB — LACTATE DEHYDROGENASE: LDH: 147 U/L (ref 98–192)

## 2019-06-23 LAB — SAVE SMEAR(SSMR), FOR PROVIDER SLIDE REVIEW

## 2019-06-23 LAB — FERRITIN: Ferritin: 235 ng/mL (ref 11–307)

## 2019-06-23 MED ORDER — ANASTROZOLE 1 MG PO TABS: 1.0000 mg | ORAL_TABLET | Freq: Every day | ORAL | 4 refills | Status: DC

## 2019-06-23 MED ORDER — HYALURONATE SODIUM 0.2 % EX CREA: 1.0000 "application " | TOPICAL_CREAM | Freq: Every day | CUTANEOUS | 3 refills | Status: DC

## 2019-06-24 ENCOUNTER — Telehealth: Payer: Self-pay | Admitting: Oncology

## 2019-06-24 NOTE — Telephone Encounter (Signed)
I talk with patient regarding schedule  

## 2019-06-25 LAB — HEMOGLOBINOPATHY EVALUATION
Hgb A2 Quant: 3.6 % — ABNORMAL HIGH (ref 1.8–3.2)
Hgb A: 70.8 % — ABNORMAL LOW (ref 96.4–98.8)
Hgb C: 0 %
Hgb F Quant: 0 % (ref 0.0–2.0)
Hgb S Quant: 25.6 % — ABNORMAL HIGH
Hgb Variant: 0 %

## 2019-06-27 ENCOUNTER — Telehealth: Payer: Self-pay | Admitting: *Deleted

## 2019-06-27 ENCOUNTER — Other Ambulatory Visit: Payer: Self-pay | Admitting: Oncology

## 2019-06-27 NOTE — Telephone Encounter (Signed)
This RN received [facility] stating prescription for hyaluronate sodium would have to be ordered and cost to patient would be $800.  This RN informed pharmacy to not order above at this time.  This RN called and discussed with pt above - who agreed she does not want prescription.  This RN discussed alternatives for use at radiation site is for aloe vera and coconut oil.  Pt verbalized understanding.

## 2019-06-27 NOTE — Progress Notes (Signed)
[place]  [phone] [phone]     ID: Catherine Lowery DOB: 01-18-1946  MR#: [id number]  ZDG#:[id number]  Patient Care Team: Alycia Rossetti, MD as PCP - General (Family Medicine) Debara Pickett [name], MD as PCP - Cardiology (Cardiology) Ketrina Boateng, Virgie Dad, MD as Consulting Physician (Oncology) Gery Pray, MD as Consulting Physician (Radiation Oncology) Renette Butters, MD as Attending Physician (Orthopedic Surgery) [name], MD as Consulting Physician (General Surgery) Milus Banister, MD as Attending Physician (Gastroenterology) Penni Bombard, MD as Consulting Physician (Neurology) Estanislado Emms, MD as Consulting Physician (Nephrology) OTHER MD:   CHIEF COMPLAINT: 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   She continues on anastrozole.  She is "feeling hot" more than having hot flashes.  It is a definite change though.  She is not having any other side effects from this medication.  Catherine Lowery's last bone density screening on 02/26/2018, showed a T-score of 1.4, which is considered normal.    Since her last visit, she underwent bilateral diagnostic mammography with tomography at [facility] on 02/18/2019 showing: breast density category B; no evidence of malignancy in either breast.    REVIEW OF SYSTEMS: Catherine Lowery was supposed to have met her lab work last week so we could discuss results today but the weather was horrible and she could not drive down.  So she had the labs drawn today and that means there will be a little bit of a delay before we have results.  She is doing normal activities, housework, shopping, but not otherwise exercising.  Unfortunately she is still smoking.  A detailed review of systems today was otherwise stable   HISTORY OF CURRENT  ILLNESS: From the original intake note:  Catherine Lowery had routine screening mammography on 02/26/2018 showing a possible abnormality in the right breast. She underwent unilateral right diagnostic mammography with tomography and right breast ultrasonography at [facility] on 03/07/2018 showing: Breast Density Category B. On mammography, there is a new 1.5 cm irregular architectural distortion with a spiculated margin in the right breast at 5 o'clock anterior depth. On ultrasound, there is a 2.7 cm irregular solid mass in the right breast at 5 o'clock middle depth. This irregular mass displays posterior acoustic shadowing. No significant abnormalities were seen sonographically in the right axilla.  Accordingly on 03/18/2018 she proceeded to biopsy of the right breast area in question. The pathology from this procedure showed ([id number]): Invasive mammary carcinoma e-cadherin negative, grade II-III. 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 Prognostic indicators significant for: estrogen receptor, 95% positive and progesterone receptor, 95% positive, both with strong staining intensity. Proliferation marker Ki67 at 5%. HER2 negative by immunohistochemistry (1+).  On 04/10/2018 the patient underwent bilateral breast MRI with and without contrast showing a 2.3 cm spiculated enhancing mass associated with non-masslike enhancement, the total abnormal area measuring 4.6 cm.  There was no evidence of lymph node abnormality and the contralateral breast was benign appearing  The patient's subsequent history is as detailed below.   PAST MEDICAL HISTORY: Past Medical History:  Diagnosis Date  . Cancer ([place]) 03/2018   right breast cancer  . Chronic kidney disease    CKD  . DJD (degenerative joint disease)    LEFT HIP  . Family history of breast cancer   . Family history of ovarian cancer   .  Family history of prostate cancer   . Family history of prostate cancer   . Hyperlipidemia     takes Simvasatin daily  . Hypertension    takes Propranolol and HCTZ daioly  . Hyperthyroidism   . Pneumonia 10/2015  . PONV (postoperative nausea and vomiting)    when ether used  . Shortness of breath dyspnea    with ambulation  . Tremors of nervous system    takes Primidone daily    PAST SURGICAL HISTORY: Past Surgical History:  Procedure Laterality Date  . BOne Spur     left heel  . BREAST LUMPECTOMY WITH RADIOACTIVE SEED AND SENTINEL LYMPH NODE BIOPSY Right 05/07/2018   Procedure: RIGHT BREAST LUMPECTOMY WITH RADIOACTIVE SEED AND RIGHT  SENTINEL LYMPH NODE MAPPING;  Surgeon: [name], MD;  Location: [place];  Service: General;  Laterality: Right;  . COLONOSCOPY    . JOINT REPLACEMENT Bilateral    knee  . KNEE SURGERY    . RE-EXCISION OF BREAST LUMPECTOMY Right 06/05/2018   Procedure: RE-EXCISION OF RIGHT BREAST LUMPECTOMY;  Surgeon: [name], MD;  Location: [place];  Service: General;  Laterality: Right;  . ROTATOR CUFF REPAIR Bilateral   . 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 TOTAL HIP ARTHROPLASTY Left 12/29/2015   Procedure: TOTAL HIP ARTHROPLASTY ANTERIOR APPROACH;  Surgeon: Ninetta Lights, MD;  Location: [place];  Service: Orthopedics;  Laterality: Left;    FAMILY HISTORY Family History  Problem Relation Age of Onset  . Hypertension Mother   . Heart disease Mother   . Stroke Mother   . Multiple myeloma Father 43  . Thyroid cancer Sister        dx 40's/50's  . Cancer Paternal Aunt        unknown  . Prostate cancer Paternal Uncle        dx >50- cancer was the cause of his death  . Ovarian cancer Paternal Aunt   . Throat cancer Paternal Uncle   . Breast cancer Cousin        dx >50  . Cancer Cousin        type unk  . Cancer Cousin        type unk  . Cancer Cousin        type unk   She notes that her father died from a multiple myeloma at age 8. Patients' mother died from stroke at age 55. The patient  has 1 brother and 2 sisters. One paternal aunt had ovarian cancer.  One cousin had breast cancer, and her 64s. Another paternal aunt had cancer, type unknown to the patient, and a paternal uncle had prostate cancer.    GYNECOLOGIC HISTORY:  Menarche: 74 years old Age at first live birth: 74 years old GX P: 1 LMP: Patient's last menstrual period was 01/24/2000. 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 Before that, she has been employed at [employer], [employer], and as an elderly caregiver. 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 She is separated, and has not spoken to her husband, Catherine Lowery, in over 10 years. She lives with her only son, Catherine Lowery, who is disabled secondary to SCD and multiple surgeries. She has no pets. She attends [employer].  ADVANCED DIRECTIVES: Her sister, Catherine Lowery, is [place]'s medical power of attorney. Catherine Lowery can be reached at [phone]. Barre has stated that her husband, Catherine Lowery, is not to have access to her medical records or to make decisions on her behalf. ]   HEALTH MAINTENANCE: Social History   Tobacco Use  . Smoking status: Current Every Day Smoker    Packs/day: 0.50    Years: 20.00    Pack years: 10.00  . Smokeless tobacco: Never Used  . Tobacco comment: 06/06/16 Pt stated "I'm trying to quit"  Substance Use Topics  . Alcohol use: No    Alcohol/week: 0.0 standard drinks  . Drug use: No     Colonoscopy: 01/2015  PAP:   Bone density:  02/26/2018: T-score 1.4   Allergies  Allergen Reactions  . Morphine And Related Itching  . Other Itching and Nausea And Vomiting    "PAIN MEDICINES"  . Percocet [Oxycodone-Acetaminophen] Nausea And Vomiting    Current Outpatient Medications  Medication Sig Dispense Refill  .  amLODipine (NORVASC) 10 MG tablet Take 10 mg by mouth daily.    Marland Kitchen anastrozole (ARIMIDEX) 1 MG tablet Take 1 tablet (1 mg total) by mouth daily. 90 tablet 4  . aspirin EC 81 MG tablet Take 81 mg by mouth daily.    Marland Kitchen atorvastatin (LIPITOR) 40 MG tablet Take 1 tablet (40 mg total) by mouth daily. 90 tablet 2  . [facility] D3 50 MCG (2000 UT) CAPS TAKE 1 CAPSULE BY MOUTH EVERY DAY 300 capsule 3  . ibuprofen (ADVIL) 200 MG tablet Take 200 mg by mouth every 6 (six) hours as needed.    . primidone (MYSOLINE) 50 MG tablet Take 2 tablets (100 mg total) by mouth every morning. 180 tablet 2  . propranolol (INDERAL) 40 MG tablet Take 0.5 tablets (20 mg total) by mouth 2 (two) times daily. 180 tablet 2  . VOLTAREN 1 % GEL APPLY TOPICALLY 4 TIMES DAILY AS NEEDED 100 g 0   No current facility-administered medications for this visit.     OBJECTIVE: Middle-aged African-American woman who appears well  There were no vitals filed for this visit. Wt Readings from Last 3 Encounters:  06/23/19 177 lb 3.2 oz (80.4 kg)  05/19/19 176 lb (79.8 kg)  04/15/19 176 lb (79.8 kg)   There is no height or weight on file to calculate BMI.    ECOG FS:1 - Symptomatic but completely ambulatory  Sclerae unicteric, EOMs intact Wearing a mask No cervical or supraclavicular adenopathy Lungs show bilateral basilar crackles, no wheezes Heart regular rate and rhythm Abd soft, nontender, positive bowel sounds MSK no focal spinal tenderness, no upper extremity lymphedema Neuro: nonfocal, well oriented, appropriate affect Breasts: The right breast is status post lumpectomy and radiation.  There is still some hyperpigmentation and mild distortion of the contour.  There is no evidence of local recurrence.  Left breast is benign.  Both axillae are benign.   LAB RESULTS:  CMP     Component Value Date/Time   NA 141 06/23/2019 1524   K 4.0 06/23/2019 1524   CL 107 06/23/2019 1524   CO2 25 06/23/2019 1524   GLUCOSE 88 06/23/2019  1524   BUN 14 06/23/2019 1524   CREATININE 1.01 (H) 06/23/2019 1524   CREATININE 1.05 (H) 05/19/2019 1034   CALCIUM 8.9 06/23/2019 1524   PROT 7.1 06/23/2019 1524   ALBUMIN 3.8 06/23/2019 1524   AST 17 06/23/2019 1524   ALT 18 06/23/2019 1524  ALKPHOS 84 06/23/2019 1524   BILITOT 0.3 06/23/2019 1524   GFRNONAA 54 (L) 06/23/2019 1524   GFRNONAA 48 (L) 01/23/2018 0804   GFRAA >60 06/23/2019 1524   GFRAA 56 (L) 01/23/2018 0804    No results found for: TOTALPROTELP, ALBUMINELP, A1GS, A2GS, BETS, BETA2SER, GAMS, MSPIKE, SPEI  No results found for: KPAFRELGTCHN, LAMBDASER, KAPLAMBRATIO  Lab Results  Component Value Date   WBC 4.9 06/23/2019   NEUTROABS 2.8 06/23/2019   HGB 11.9 (L) 06/23/2019   HCT 36.4 06/23/2019   MCV 71.7 (L) 06/23/2019   PLT 199 06/23/2019   No results found for: LABCA2  No components found for: HDQQIW979  No results for input(s): INR in the last 168 hours.  No results found for: LABCA2  No results found for: GXQ119  No results found for: ERD408  No results found for: XKG818  No results found for: CA2729  No components found for: HGQUANT  No results found for: CEA1 / No results found for: CEA1   No results found for: AFPTUMOR  No results found for: [facility]  Lab Results  Component Value Date   HGBA 70.8 (L) 06/23/2019   HGBA2QUANT 3.6 (H) 06/23/2019   HGBFQUANT 0.0 06/23/2019   HGBSQUAN 25.6 (H) 06/23/2019   (Hemoglobinopathy evaluation)   Lab Results  Component Value Date   LDH 147 06/23/2019    Lab Results  Component Value Date   IRON 57 06/23/2019   TIBC 248 06/23/2019   IRONPCTSAT 23 06/23/2019   (Iron and TIBC)  Lab Results  Component Value Date   FERRITIN 235 06/23/2019    Urinalysis    Component Value Date/Time   COLORURINE YELLOW 09/21/2017 1533   APPEARANCEUR CLOUDY (A) 09/21/2017 1533   LABSPEC 1.015 09/21/2017 1533   PHURINE 5.5 09/21/2017 1533   GLUCOSEU NEGATIVE 09/21/2017 1533   HGBUR TRACE (A)  09/21/2017 1533   BILIRUBINUR SMALL (A) 03/24/2011 1054   KETONESUR NEGATIVE 09/21/2017 1533   PROTEINUR NEGATIVE 09/21/2017 1533   UROBILINOGEN 0.2 03/24/2011 1054   NITRITE NEGATIVE 09/21/2017 1533   LEUKOCYTESUR TRACE (A) 09/21/2017 1533    STUDIES:  No results found.   ELIGIBLE FOR AVAILABLE RESEARCH PROTOCOL: no   ASSESSMENT: 74 y.o. [employer], [place] woman status post right breast lower inner quadrant biopsy 03/18/2018 for a clinical T2N0, [name], grade 2 or 3, estrogen and progesterone receptor positive, [name], with an MIB-1 of 5%  (1) Genetic testing performed through [facility] Common Hereditary Cancers Panel + Thyroid Cancer Panel on 05/09/2018 showing no deleterious mutations APC, [name], [name], [name], [name], [name], [name], [name], [name], [name], [name], [name], [name], [name], [name], [name], [name], [name], [name], [name], [name], KIT, MEN1, [name], [name], [name], [name], [name], [name], [name], [name], [name], [name], [name], [name], [name], POLE, [name], [name], RAD50, [name], [name], [name], [name], [name], [name], [name], [name], [name], [name], [name], [name], [name], [name], [name], VHL.   (a) Two variants of uncertain significance in the genes [name] c.1176C>T (Silent) and [name] c.1419+4C>T (Intronic) were identified.  (2) right lumpectomy and sentinel lymph node sampling 05/07/2018 showed a pT2 pN1, [name], grade 2, with a positive inferior margin  (a) reexcision scheduled for 06/05/2018  (3) MammaPrint obtained from the definitive surgical sample read as "low risk" predicting a disease-free survival at 5 years of 97.8% without need of chemotherapy.  (4) adjuvant radiation 07/11/2018 - 08/27/2018  (a) 1. Right breast; 28 fractions of 1.8 Gy for a total of 50.4  Gy                      2. Right axilla; [name]; 6 fractions of 2 Gy for a total of 12 Gy  (5)  anastrozole started 10/02/2018  (a) bone density 10/29/219 at [place] shows a T scoe of +1.4 (normal)  (6) genetic testing 05/18/2018 through the Common Hereditary Cancers Panel + Thyroid [place]er panel showed no deleterious mutations in APC, [name], [name], [name], [name], [name], [name], [name], [name], [name], [name], [name], [name], [name], [name], [name], [name], [name], [name], [name], [name], KIT, MEN1, [name], [name], [name], [name], [name], [name], [name], [name], [name], [name], [name], [name], [name], POLE, [name], [name], RAD50, [name], [name], [name], [name], [name], [name], [name], [name], [name], [name], [name], [name], [name], [name], [name], VHL.  (a) 2 variants of uncertain significance in the genes [name] c.1176C>T (Silent) and [name] c.1419+4C>T (Intronic) were identified.    (b) UPDATE: The [name] c.1419+4C>T (Intronic) VUS has been reclassified to "Likely Benign." The report date is 08/08/2018.  (7) the patient has thalassemia/sickle cell trait: Hemoglobin electrophoresis 06/23/2019 shows hemoglobin 870.8%, [name], hemoglobin A2 elevated at 3.6%   PLAN: L[place]ia is now just over a year out from definitive surgery for her breast cancer with no evidence of disease recurrence.  This is favorable.  She is tolerating anastrozole well and the plan will be to continue that a total of 5 years.  We discussed her mammogram from last year and she has breast density category B which is also good.  She will be due for repeat mammography October of this year and we will repeat a bone density at that time.  I will see her in October and then yearly from that point.  We should have the results of all her lab work today within a week or so and I will send her a note with those results but there is very difficult to explain without the patient being present so we will discuss them again at her next visit.  She would like to continue the radio Plex a little longer and I have put in the prescription for her.  Total encounter  time 25 minutes.*  Stefany Starace, Virgie Dad, MD  06/27/19 1:21 PM Medical Oncology and Hematology [facility] [place], [place] [address] Tel. [phone]    Fax. [phone]   I, Wilburn Mylar, am acting as scribe for Dr. Virgie Dad. Anessia [place].  I, [name] MD, have reviewed the above documentation for accuracy and completeness, and I agree with the above.    *Total Encounter Time as defined by the Centers for Medicare and Medicaid Services includes, in addition to the face-to-face time of a patient visit (documented in the note above) non-face-to-face time: obtaining and reviewing outside history, ordering and reviewing medications, tests or procedures, care coordination (communications with other health care professionals or caregivers) and documentation in the medical record.

## 2019-06-30 ENCOUNTER — Telehealth: Payer: Self-pay | Admitting: Family Medicine

## 2019-06-30 ENCOUNTER — Telehealth: Payer: Self-pay | Admitting: Internal Medicine

## 2019-06-30 NOTE — Telephone Encounter (Signed)
Pt c/o medication issue:  1. Name of Medication: Aspirin 81 mg  2. How are you currently taking this medication (dosage and times per day)?  1 time a day  3. Are you having a reaction (difficulty breathing--STAT)? no  4. What is your medication issue?  Having headaches

## 2019-06-30 NOTE — Telephone Encounter (Signed)
That sounds like rebound headache - there are treatments for this, but they generally improve in a short while. I agree with reaching out to her PCP.  Dr Lemmie Evens

## 2019-06-30 NOTE — Telephone Encounter (Signed)
Patient had appointment on 12/15 and states she was lowered from her Aspirin 325 to 81 mg, she states for the past month and a half she has woke up with a headache every morning since dropping this. She states that her blood pressure has been fine 128/68. And no other symptoms.  She is to see the NP at her pcp tomorrow- but wanted to check with Dr.[name] for any recommendations.

## 2019-06-30 NOTE — Telephone Encounter (Signed)
Call placed to patient to inquire.   Reports that Dr. Debara Pickett decreased her ASA from 325mg  to 81mg . States that since med change, she has been having almost daily HA. Advised to schedule OV to evaluate. Also advised to contact Dr. Debara Pickett for recommendations.   Appointment scheduled with NP for 07/01/2019.

## 2019-06-30 NOTE — Telephone Encounter (Signed)
noted 

## 2019-06-30 NOTE — Telephone Encounter (Signed)
Patient left message confused about one of her medications and the dosage she is taking did not specify what medication  Please call her back at [phone]

## 2019-07-01 ENCOUNTER — Other Ambulatory Visit: Payer: Self-pay

## 2019-07-01 ENCOUNTER — Ambulatory Visit (INDEPENDENT_AMBULATORY_CARE_PROVIDER_SITE_OTHER): Payer: Medicare HMO | Admitting: Nurse Practitioner

## 2019-07-01 ENCOUNTER — Encounter: Payer: Self-pay | Admitting: Nurse Practitioner

## 2019-07-01 VITALS — BP 142/70 | HR 57 | Temp 98.5°F | Resp 18 | Ht 69.0 in | Wt 178.0 lb

## 2019-07-01 DIAGNOSIS — R519 Headache, unspecified: Secondary | ICD-10-CM | POA: Diagnosis not present

## 2019-07-01 DIAGNOSIS — I10 Essential (primary) hypertension: Secondary | ICD-10-CM | POA: Diagnosis not present

## 2019-07-01 DIAGNOSIS — Z8669 Personal history of other diseases of the nervous system and sense organs: Secondary | ICD-10-CM | POA: Insufficient documentation

## 2019-07-01 HISTORY — DX: Personal history of other diseases of the nervous system and sense organs: Z86.69

## 2019-07-01 MED ORDER — ASPIRIN 325 MG PO TABS: ORAL_TABLET | ORAL | 0 refills | Status: AC

## 2019-07-01 NOTE — Progress Notes (Signed)
Acute Office Visit  Subjective:    Patient ID: Catherine Lowery, female    DOB: December 30, 1945, 74 y.o.   MRN: [id number]  Chief Complaint: I am having frequent headaches since decreasing my asa dose.   HPI Catherine Lowery is a 74 year old African American female patient presenting to the clinic today with complaints of frequent headaches. She did complete a telephone encounter with her usual pcp providers nurse with message regarding cards decreased ASA to '81mg'$  and since had been having daily h/a and was advised to make an appt. The pt reports that her cardiologist recently decided to reduce asa to 81 mg r/t not needing to be on 325 mg dose. Since reduced dose she wakes up daily with a mild headache. She reports know h/o migraines and the current h/a's are not migraines rather are mild. She has tried taking tylenol when she awakens. The tylenol does help to resolve the sxs however she desires to not wake every morning. Time spent discussing red flags of a headache, causes of headaches such as elevated blood pressure, swelling of the brain, brain bleeds and the pt understands the risk versus benefit and when to seek urgent medical attention. She denied vertigo, cp/ct, gu/gi sxs, seizures or recent fall or injury. She denied blood in stool, dark stool, or coffee grounds in stool.   Past Medical History:  Diagnosis Date  . Cancer ([place]) 03/2018   right breast cancer  . Chronic kidney disease    CKD  . DJD (degenerative joint disease)    LEFT HIP  . Family history of breast cancer   . Family history of ovarian cancer   . Family history of prostate cancer   . Family history of prostate cancer   . H/O migraine 07/01/2019  . Hyperlipidemia    takes Simvasatin daily  . Hypertension    takes Propranolol and HCTZ daioly  . Hyperthyroidism   . Pneumonia 10/2015  . PONV (postoperative nausea and vomiting)    when ether used  . Shortness of breath dyspnea    with ambulation  . Tremors of nervous  system    takes Primidone daily    Past Surgical History:  Procedure Laterality Date  . BOne Spur     left heel  . BREAST LUMPECTOMY WITH RADIOACTIVE SEED AND SENTINEL LYMPH NODE BIOPSY Right 05/07/2018   Procedure: RIGHT BREAST LUMPECTOMY WITH RADIOACTIVE SEED AND RIGHT  SENTINEL LYMPH NODE MAPPING;  Surgeon: [name], MD;  Location: [place];  Service: General;  Laterality: Right;  . COLONOSCOPY    . JOINT REPLACEMENT Bilateral    knee  . KNEE SURGERY    . RE-EXCISION OF BREAST LUMPECTOMY Right 06/05/2018   Procedure: RE-EXCISION OF RIGHT BREAST LUMPECTOMY;  Surgeon: [name], MD;  Location: [place];  Service: General;  Laterality: Right;  . ROTATOR CUFF REPAIR Bilateral   . 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 TOTAL HIP ARTHROPLASTY Left 12/29/2015   Procedure: TOTAL HIP ARTHROPLASTY ANTERIOR APPROACH;  Surgeon: Ninetta Lights, MD;  Location: [place];  Service: Orthopedics;  Laterality: Left;    Family History  Problem Relation Age of Onset  . Hypertension Mother   . Heart disease Mother   . Stroke Mother   . Multiple myeloma Father 32  . Thyroid cancer Sister        dx 40's/50's  . Cancer Paternal Aunt        unknown  . Prostate  cancer Paternal Uncle        dx >50- cancer was the cause of his death  . Ovarian cancer Paternal Aunt   . Throat cancer Paternal Uncle   . Breast cancer Cousin        dx >50  . Cancer Cousin        type unk  . Cancer Cousin        type unk  . Cancer Cousin        type unk    Social History   Socioeconomic History  . Marital status: Married    Spouse name: Not on file  . Number of children: 1  . Years of education: 43  . Highest education level: Not on file  Occupational History    Comment: retired  Tobacco Use  . Smoking status: Current Every Day Smoker    Packs/day: 0.50    Years: 20.00    Pack years: 10.00  . Smokeless tobacco: Never Used  . Tobacco comment: 06/06/16 Pt stated  "I'm trying to quit"  Substance and Sexual Activity  . Alcohol use: No    Alcohol/week: 0.0 standard drinks  . Drug use: No  . Sexual activity: Not Currently    Birth control/protection: Post-menopausal  Other Topics Concern  . Not on file  Social History Narrative   Lives with handicapped son   Caffeine- coffee, 2 cups daily, 1-2 sodas daily   Social Determinants of Health   Financial Resource Strain:   . Difficulty of Paying Living Expenses: Not on file  Food Insecurity:   . Worried About Charity fundraiser in the Last Year: Not on file  . Ran Out of Food in the Last Year: Not on file  Transportation Needs:   . Lack of Transportation (Medical): Not on file  . Lack of Transportation (Non-Medical): Not on file  Physical Activity:   . Days of Exercise per Week: Not on file  . Minutes of Exercise per Session: Not on file  Stress:   . Feeling of Stress : Not on file  Social Connections:   . Frequency of Communication with Friends and Family: Not on file  . Frequency of Social Gatherings with Friends and Family: Not on file  . Attends Religious Services: Not on file  . Active Member of Clubs or Organizations: Not on file  . Attends Archivist Meetings: Not on file  . Marital Status: Not on file  Intimate Partner Violence:   . Fear of Current or Ex-Partner: Not on file  . Emotionally Abused: Not on file  . Physically Abused: Not on file  . Sexually Abused: Not on file   Allergies  Allergen Reactions  . Morphine And Related Itching  . Other Itching and Nausea And Vomiting    "PAIN MEDICINES"  . Percocet [Oxycodone-Acetaminophen] Nausea And Vomiting    Review of Systems  All other systems reviewed and are negative.     Objective:    Physical Exam Vitals and nursing note reviewed.  Constitutional:      Appearance: Normal appearance. She is normal weight.  HENT:     Head: Normocephalic.     Right Ear: External ear normal.     Left Ear: External ear  normal.     Nose: Nose normal.  Eyes:     General: No scleral icterus.    Extraocular Movements: Extraocular movements intact.     Conjunctiva/sclera: Conjunctivae normal.     Pupils: Pupils are equal, round,  and reactive to light.  Cardiovascular:     Rate and Rhythm: Normal rate.     Pulses: Normal pulses.  Pulmonary:     Effort: Pulmonary effort is normal.  Musculoskeletal:        General: Normal range of motion.     Cervical back: Normal range of motion and neck supple.  Skin:    General: Skin is warm and dry.     Capillary Refill: Capillary refill takes less than 2 seconds.  Neurological:     General: No focal deficit present.     Mental Status: She is alert and oriented to person, place, and time.     Sensory: No sensory deficit.     Motor: No weakness.     Coordination: Coordination is intact. Coordination normal.     Gait: Gait is intact. Gait normal.     Deep Tendon Reflexes: Reflexes normal.  Psychiatric:        Attention and Perception: Attention and perception normal.        Mood and Affect: Mood and affect normal.        Speech: Speech normal.        Behavior: Behavior normal. Behavior is cooperative.        Thought Content: Thought content normal.        Cognition and Memory: Cognition and memory normal.        Judgment: Judgment normal.     BP (!) 142/70 (BP Location: Left Arm, Patient Position: Sitting, Cuff Size: Normal)   Pulse (!) 57   Temp 98.5 F (36.9 C) (Oral)   Resp 18   Ht '5\' 9"'$  (1.753 m)   Wt 178 lb (80.7 kg)   LMP 01/24/2000   SpO2 98%   BMI 26.29 kg/m  Wt Readings from Last 3 Encounters:  07/01/19 178 lb (80.7 kg)  06/23/19 177 lb 3.2 oz (80.4 kg)  05/19/19 176 lb (79.8 kg)    Lab Results  Component Value Date   TSH 2.87 09/11/2018   Lab Results  Component Value Date   WBC 4.9 06/23/2019   HGB 11.9 (L) 06/23/2019   HCT 36.4 06/23/2019   MCV 71.7 (L) 06/23/2019   PLT 199 06/23/2019   Lab Results  Component Value Date   NA  141 06/23/2019   K 4.0 06/23/2019   CO2 25 06/23/2019   GLUCOSE 88 06/23/2019   BUN 14 06/23/2019   CREATININE 1.01 (H) 06/23/2019   BILITOT 0.3 06/23/2019   ALKPHOS 84 06/23/2019   AST 17 06/23/2019   ALT 18 06/23/2019   PROT 7.1 06/23/2019   ALBUMIN 3.8 06/23/2019   CALCIUM 8.9 06/23/2019   ANIONGAP 9 06/23/2019   Lab Results  Component Value Date   CHOL 136 05/19/2019   Lab Results  Component Value Date   HDL 32 (L) 05/19/2019   Lab Results  Component Value Date   LDLCALC 81 05/19/2019   Lab Results  Component Value Date   TRIG 129 05/19/2019   Lab Results  Component Value Date   CHOLHDL 4.3 05/19/2019   Lab Results  Component Value Date   HGBA1C 5.5 05/16/2016       Assessment & Plan:   After our discussion and review of your most recent labs and other recent medical visit notes, I feel that taking ASA '325mg'$  dose as before to prevent your headache would be prudent.  You should use enteric coated ASA, take on a full stomach, and take a pepcid '20mg'$   over the counter medication at the same time.  You should monitor your blood pressure at home at 2-3 hours after taking your blood pressure lowering medications reporting reading greater than 140/90 as discussed elevated blood pressure could cause a headache.  You should seek urgent emergency medical treatment for headache that is different, new symptoms such as vertigo/dizziness, nausea, vomiting non resolving or worsening headache.  Seek medical attention for vomiting blood, dark stools, coffee ground appearence in stools, red blood in your stools. Follow up with your regular PCP as planned   Problem List Items Addressed This Visit      Cardiovascular and Mediastinum   Benign essential HTN   Relevant Medications   aspirin (ASPIRIN ADULT) 325 MG tablet     Other   H/O migraine   Relevant Medications   aspirin (ASPIRIN ADULT) 325 MG tablet    Other Visit Diagnoses    Frequent headaches    -  Primary    Relevant Medications   aspirin (ASPIRIN ADULT) 325 MG tablet     Follow Up: As needed for non resolving or worsening symptoms.   Annie Main, FNP

## 2019-07-01 NOTE — Telephone Encounter (Signed)
Noted thanks °

## 2019-07-04 ENCOUNTER — Telehealth: Payer: Self-pay

## 2019-07-04 NOTE — Telephone Encounter (Signed)
Can you provide directions for the aspirin 325 mg and resend to [facility]? Thanks.

## 2019-07-07 ENCOUNTER — Other Ambulatory Visit: Payer: Self-pay | Admitting: Nurse Practitioner

## 2019-07-07 NOTE — Telephone Encounter (Signed)
Aspirin is over the counter. It was take as she did prior. I did mention that she should use enteric coated aspirin.

## 2019-09-12 DIAGNOSIS — Z0100 Encounter for examination of eyes and vision without abnormal findings: Secondary | ICD-10-CM | POA: Diagnosis not present

## 2019-09-12 DIAGNOSIS — H5212 Myopia, left eye: Secondary | ICD-10-CM | POA: Diagnosis not present

## 2019-09-17 ENCOUNTER — Other Ambulatory Visit: Payer: Self-pay

## 2019-09-17 ENCOUNTER — Encounter: Payer: Self-pay | Admitting: Family Medicine

## 2019-09-17 ENCOUNTER — Ambulatory Visit (INDEPENDENT_AMBULATORY_CARE_PROVIDER_SITE_OTHER): Payer: Medicare HMO | Admitting: Family Medicine

## 2019-09-17 VITALS — BP 138/62 | HR 62 | Temp 98.3°F | Resp 16 | Ht 69.0 in | Wt 178.0 lb

## 2019-09-17 DIAGNOSIS — E0520 Thyrotoxicosis with toxic multinodular goiter without thyrotoxic crisis or storm: Secondary | ICD-10-CM | POA: Diagnosis not present

## 2019-09-17 DIAGNOSIS — I10 Essential (primary) hypertension: Secondary | ICD-10-CM | POA: Diagnosis not present

## 2019-09-17 DIAGNOSIS — E782 Mixed hyperlipidemia: Secondary | ICD-10-CM

## 2019-09-17 NOTE — Assessment & Plan Note (Signed)
Repeat thyroid labs, needs once a year, no longer following with endocrinology

## 2019-09-17 NOTE — Progress Notes (Signed)
   Subjective:    Patient ID: Catherine Lowery, female    DOB: 1945-11-24, 74 y.o.   MRN: [id number]  Patient presents for Follow-up (is fasting)  Patient here to follow-up chronic medical problems.  Medications reviewed   HYperlipidemia-on lipitor , LDL at goal in Jan 81    HTN- on propranolol and Norvasc  , propranolol also helps a little  with tremor    Appt last Friday  [facility], has new lens    CKD stage III has appt in July with Nephrology      Migraines- now taking coated ASA and now headaches , lots of stress with her adult son who has sickle cell anemia, he is currently in the hospital     Reviewed last visit from oncology in Feb, also her labs from Feb kidney function was at baseline   Continued on Arimedex    Long loner following with endocrinology ( Dr. Renne Crigler)  history of goiter and hyperthyroid symptoms needs thyroid labs done today     COVID-19 Vaccine UTD      Review Of Systems:  GEN- denies fatigue, fever, weight loss,weakness, recent illness HEENT- denies eye drainage, change in vision, nasal discharge, CVS- denies chest pain, palpitations RESP- denies SOB, cough, wheeze ABD- denies N/V, change in stools, abd pain GU- denies dysuria, hematuria, dribbling, incontinence MSK- denies joint pain, muscle aches, injury Neuro- denies headache, dizziness, syncope, seizure activity       Objective:    BP 138/62   Pulse 62   Temp 98.3 F (36.8 C) (Temporal)   Resp 16   Ht 5\' 9"  (1.753 m)   Wt 178 lb (80.7 kg)   LMP 01/24/2000   SpO2 94%   BMI 26.29 kg/m  GEN- NAD, alert and oriented x3 HEENT- PERRL, EOMI, non injected sclera, pink conjunctiva, MMM, oropharynx clear Neck- Supple, no thyromegaly CVS- RRR, no murmur RESP-CTAB ABD-NABS,soft,NT,ND EXT- trace ankle  edema Pulses- Radial, DP- 2+        Assessment & Plan:      Problem List Items Addressed This Visit      Unprioritized   Benign essential HTN - Primary    Controlled no  changes to propranolol or norvasc       Hyperlipidemia    Continue lipitor , lipids at goal, LFT recently done      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 Any transcriptional errors that result from this process are unintentional.

## 2019-09-17 NOTE — Assessment & Plan Note (Addendum)
Continue lipitor , lipids at goal, LFT recently done

## 2019-09-17 NOTE — Assessment & Plan Note (Addendum)
Controlled no changes to propranolol or norvasc

## 2019-09-17 NOTE — Patient Instructions (Signed)
F/U 6 months  We will call with thyroid labs

## 2019-09-18 LAB — T4, FREE: Free T4: 0.9 ng/dL (ref 0.8–1.8)

## 2019-09-18 LAB — TSH: TSH: 5.67 mIU/L — ABNORMAL HIGH (ref 0.40–4.50)

## 2019-09-18 LAB — T3, FREE: T3, Free: 3 pg/mL (ref 2.3–4.2)

## 2019-10-27 ENCOUNTER — Telehealth: Payer: Self-pay | Admitting: Internal Medicine

## 2019-10-27 NOTE — Telephone Encounter (Signed)
error 

## 2019-11-21 ENCOUNTER — Ambulatory Visit (INDEPENDENT_AMBULATORY_CARE_PROVIDER_SITE_OTHER): Payer: Medicare HMO | Admitting: Internal Medicine

## 2019-11-21 ENCOUNTER — Other Ambulatory Visit: Payer: Self-pay

## 2019-11-21 ENCOUNTER — Encounter: Payer: Self-pay | Admitting: Internal Medicine

## 2019-11-21 VITALS — BP 118/70 | HR 55 | Ht 69.0 in | Wt 175.0 lb

## 2019-11-21 DIAGNOSIS — E782 Mixed hyperlipidemia: Secondary | ICD-10-CM

## 2019-11-21 DIAGNOSIS — C50311 Malignant neoplasm of lower-inner quadrant of right female breast: Secondary | ICD-10-CM

## 2019-11-21 DIAGNOSIS — E559 Vitamin D deficiency, unspecified: Secondary | ICD-10-CM

## 2019-11-21 DIAGNOSIS — E038 Other specified hypothyroidism: Secondary | ICD-10-CM

## 2019-11-21 DIAGNOSIS — Z720 Tobacco use: Secondary | ICD-10-CM | POA: Diagnosis not present

## 2019-11-21 DIAGNOSIS — Z170 Estrogen receptor positive status [ER+]: Secondary | ICD-10-CM | POA: Diagnosis not present

## 2019-11-21 DIAGNOSIS — E0520 Thyrotoxicosis with toxic multinodular goiter without thyrotoxic crisis or storm: Secondary | ICD-10-CM

## 2019-11-21 DIAGNOSIS — E039 Hypothyroidism, unspecified: Secondary | ICD-10-CM | POA: Diagnosis not present

## 2019-11-21 DIAGNOSIS — E042 Nontoxic multinodular goiter: Secondary | ICD-10-CM | POA: Diagnosis not present

## 2019-11-21 LAB — T3, FREE: T3, Free: 3.5 pg/mL (ref 2.3–4.2)

## 2019-11-21 LAB — TSH: TSH: 4.56 u[IU]/mL — ABNORMAL HIGH (ref 0.35–4.50)

## 2019-11-21 LAB — T4, FREE: Free T4: 0.79 ng/dL (ref 0.60–1.60)

## 2019-11-21 NOTE — Patient Instructions (Addendum)
Please stop at the lab.  If we need to start Levothyroxine, take the thyroid hormone every day, with water, at least 30 minutes before breakfast, separated by at least 4 hours from: - acid reflux medications - calcium - iron - multivitamins  We will order a new thyroid ultrasound.  Please return in 6 months.

## 2019-11-21 NOTE — Progress Notes (Addendum)
Patient ID: Catherine Lowery, female   DOB: 1945/11/22, 74 y.o.   MRN: [id number]  This visit occurred during the SARS-CoV-2 public health emergency.  Safety protocols were in place, including screening questions prior to the visit, additional usage of staff PPE, and extensive cleaning of exam room while observing appropriate contact time as indicated for disinfecting solutions.   HPI  Catherine Lowery is a 74 y.o.-year-old female, initially referred by Catherine Lowery cardiologist, Dr. Debara Pickett, returning for f/u for history of toxic MNG, with subclinical thyrotoxicosis, now s/p RAI tx. Last visit 1 year and 2 months ago.  Reviewed and addended history: Pt was found to have a low TSH at the time of a recent hospitalization for CP in 12/2014. She r/o for an AMI. She had a follow up visit with Dr Debara Pickett >> a TSH was repeated >> better, but still low.  Thyroid Uptake and scan (11/30/2015) report reviewed:  The 24 hour radioactive iodine uptake is equal to 21.7%.  Heterogeneous tracer activity is identified within both lobes of the thyroid gland. Relative areas of decreased uptake localizing to the upper pole of the left lobe, mid right lobe and upper pole of right lobe is noted.  IMPRESSION: 1. 24 hour radioactive iodine uptake is within normal limits at 21.7%. 2. Multi nodular thyroid gland.      We next obtained a thyroid U/S (12/09/2015) to investigate the upper "cold" thyroid nodules - however, these were small, while the dominant nodules appeared "hot" >> we did not have to biopsy them: Right thyroid lobe: 5.9 x 2.0 x 2.4 cm. Multiple nodules are scattered throughout the right lobe.  1.3 x 0.7 x 0.9 cm mid lobe solid nodule. 1.7 x 1.3 x 0.8 cm upper pole solid nodule.  1.9 x 0.9 x 1.2 cm lower pole nodule.  Other scattered smaller nodules are noted. Left thyroid lobe: 5.6 x 1.5 x 2.0 cm. Multiple solid nodules are scattered throughout the left lobe.  1.0 x 1.1 x 1.4 cm mid lobe nodule.  2.1 x  0.9 x 1.2 cm lower pole nodule.  1.4 x 1.0 x 1.1 cm upper pole nodule.  Other smaller nodules are noted. Isthmus Thickness: 0.4 cm.  Small subcentimeter nodules are present. Lymphadenopathy: None visualized.  Pt had RAI tx (02/24/2016): 29.4 mCi    C[place]y tests initially normalized after Catherine Lowery RAI treatment, without the need of levothyroxine.  However, latest TSH obtained 2 months ago was slightly elevated.  Reviewed Catherine [place]y TFTs: Lab Results  Component Value Date   TSH 5.67 (H) 09/17/2019   TSH 2.87 09/11/2018   TSH 2.27 09/10/2017   TSH 2.43 03/13/2017   TSH 1.89 11/10/2016   TSH 1.80 07/04/2016   TSH 0.07 (L) 04/05/2016   TSH 0.04 (L) 11/15/2015   TSH 0.33 (L) 06/30/2015   TSH 0.20 (L) 03/02/2015   FREET4 0.9 09/17/2019   FREET4 0.93 09/11/2018   FREET4 0.76 09/10/2017   FREET4 0.78 03/13/2017   FREET4 0.67 11/10/2016   FREET4 0.76 07/04/2016   FREET4 0.93 04/05/2016   FREET4 0.93 11/15/2015   FREET4 0.85 06/30/2015   FREET4 0.78 03/02/2015    Catherine Lowery Graves' antibodies were not elevated: Lab Results  Component Value Date   TSI 51 03/02/2015   She has chronic, familial, tremors.  She sees neurology (Dr. Amalia Hailey in [place]).  She is on propranolol and primidone.  Pt denies: - feeling nodules in neck - dysphagia - choking - SOB with lying down She continues to have  hoarseness from smoking.  She has a family history of thyroid cancer in sister.  No FH of thyroid cancer. + h/o radiation tx to head or neck (RAI tx0  No recent contrast studies. No herbal supplements. No Biotin use. No recent steroids use.   She was diagnosed with breast cancer in 03/2018. She had partial mastectomy, then more extensive R breast sx. she had RxTx up to 07/2018. On Arimidex >> hot flushes.  On B12.  ROS: Constitutional: no weight gain/no weight loss, no fatigue, no subjective hyperthermia, no subjective hypothermia Eyes: no blurry vision, no xerophthalmia ENT: no sore throat, + see  HPI Cardiovascular: no CP/no SOB/no palpitations/+ leg swelling Respiratory: no cough/no SOB/no wheezing Gastrointestinal: no N/no V/no D/no C/no acid reflux Musculoskeletal: no muscle aches/no joint aches Skin: no rashes, no hair loss Neurological: + tremors/no numbness/no tingling/no dizziness  I reviewed pt's medications, allergies, PMH, social hx, family hx, and changes were documented in the history of present illness. Otherwise, unchanged from my initial visit note.  Past Medical History:  Diagnosis Date  . Cancer (HCC) 03/2018   right breast cancer  . Chronic kidney disease    CKD  . DJD (degenerative joint disease)    LEFT HIP  . Family history of breast cancer   . Family history of ovarian cancer   . Family history of prostate cancer   . Family history of prostate cancer   . H/O migraine 07/01/2019  . Hyperlipidemia    takes Simvasatin daily  . Hypertension    takes Propranolol and HCTZ daioly  . Hyperthyroidism   . Pneumonia 10/2015  . PONV (postoperative nausea and vomiting)    when ether used  . Shortness of breath dyspnea    with ambulation  . Tremors of nervous system    takes Primidone daily   Past Surgical History:  Procedure Laterality Date  . BOne Spur     left heel  . BREAST LUMPECTOMY WITH RADIOACTIVE SEED AND SENTINEL LYMPH NODE BIOPSY Right 05/07/2018   Procedure: RIGHT BREAST LUMPECTOMY WITH RADIOACTIVE SEED AND RIGHT  SENTINEL LYMPH NODE MAPPING;  Surgeon: Harriette Bouillon, MD;  Location: [facility];  Service: General;  Laterality: Right;  . COLONOSCOPY    . JOINT REPLACEMENT Bilateral    knee  . KNEE SURGERY    . RE-EXCISION OF BREAST LUMPECTOMY Right 06/05/2018   Procedure: RE-EXCISION OF RIGHT BREAST LUMPECTOMY;  Surgeon: Harriette Bouillon, MD;  Location: [facility];  Service: General;  Laterality: Right;  . ROTATOR CUFF REPAIR Bilateral   . 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 TOTAL HIP ARTHROPLASTY Left 12/29/2015    Procedure: TOTAL HIP ARTHROPLASTY ANTERIOR APPROACH;  Surgeon: Loreta Ave, MD;  Location: [facility] OR;  Service: Orthopedics;  Laterality: Left;   Social History   Social History  . Marital Status: Married    Spouse Name: N/A  . Number of Children: 1   Occupational History   retired   Social History Main Topics  . Smoking status: Current Every Day Smoker -- 0.50 packs/day for 20 years  . Smokeless tobacco: Not on file  . Alcohol Use: No  . Drug Use: No   Current Outpatient Medications on File Prior to Visit  Medication Sig Dispense Refill  . amLODipine (NORVASC) 10 MG tablet Take 10 mg by mouth daily.    Marland Kitchen anastrozole (ARIMIDEX) 1 MG tablet Take 1 tablet (1 mg total) by mouth daily. 90 tablet 4  . aspirin (ASPIRIN  ADULT) 325 MG tablet enteric coated; may use OTC 30 tablet 0  . atorvastatin (LIPITOR) 40 MG tablet Take 1 tablet (40 mg total) by mouth daily. 90 tablet 2  . [facility] D3 50 MCG (2000 UT) CAPS TAKE 1 CAPSULE BY MOUTH EVERY DAY 300 capsule 3  . primidone (MYSOLINE) 50 MG tablet Take 2 tablets (100 mg total) by mouth every morning. 180 tablet 2  . propranolol (INDERAL) 40 MG tablet Take 0.5 tablets (20 mg total) by mouth 2 (two) times daily. 180 tablet 2  . VOLTAREN 1 % GEL APPLY TOPICALLY 4 TIMES DAILY AS NEEDED 100 g 0   No current facility-administered medications on file prior to visit.   Allergies  Allergen Reactions  . Morphine And Related Itching  . Other Itching and Nausea And Vomiting    "PAIN MEDICINES"  . Percocet [Oxycodone-Acetaminophen] Nausea And Vomiting   Family History  Problem Relation Age of Onset  . Hypertension Mother   . Heart disease Mother   . Stroke Mother   . Multiple myeloma Father 18  . Thyroid cancer Sister        dx 40's/50's  . Cancer Paternal Aunt        unknown  . Prostate cancer Paternal Uncle        dx >50- cancer was the cause of his death  . Ovarian cancer Paternal Aunt   . Throat cancer Paternal Uncle   . Breast cancer  Cousin        dx >50  . Cancer Cousin        type unk  . Cancer Cousin        type unk  . Cancer Cousin        type unk  + see HPI  PE: BP 118/70   Pulse 55   Ht '5\' 9"'$  (1.753 m)   Wt 175 lb (79.4 kg)   LMP 01/24/2000   SpO2 98%   BMI 25.84 kg/m  Body mass index is 25.84 kg/m. Wt Readings from Last 3 Encounters:  11/21/19 175 lb (79.4 kg)  09/17/19 178 lb (80.7 kg)  07/01/19 178 lb (80.7 kg)   Constitutional: slightly overweight, in NAD Eyes: PERRLA, EOMI, no exophthalmos ENT: moist mucous membranes, no thyromegaly, no cervical lymphadenopathy Cardiovascular: RRR, No MRG, + B periankle edema Respiratory: CTA B Gastrointestinal: abdomen soft, NT, ND, BS+ Musculoskeletal: no deformities, strength intact in all 4 Skin: moist, warm, no rashes Neurological: + tremor with outstretched hands, DTR not elicited in knees (B TKRs)  ASSESSMENT: 1.  Multiple thyroid nodules - H/o Toxic MNG  2. Subclinical hypothyroidism  PLAN:  1. Patient with history of multiple thyroid nodules and subclinical thyrotoxicosis due to toxic MNG. She did not have symptoms from [place] thyrotoxicosis except for tremors, which were chronic.  She is on beta-blockers for these.  She is now status post RAI treatment in 01/2016, with subsequent normalization of [place]y TFTs. At last visit, we discussed about the fact that there was still a possibility of becoming hypothyroid after RAI treatment and needing levothyroxine.  However, [place]y tests were normal at that time and I advised Catherine Lowery to have these rechecked by PCP yearly and return to me if these are abnormal.  She had TFTs checked in 08/2019 and a TSH was slightly elevated at 5.67 while [place]y free thyroid hormones were normal. -She denies any hypothyroid symptoms -We will repeat [place]y TFTs today and we discussed about  how to take levothyroxine correctly, in case  we need to start: every day, with water, at least 30 minutes before breakfast, separated by at least 4 hours  from: - acid reflux medications - calcium - iron - multivitamins -If we end up starting levothyroxine, will need a repeat set of labs in 1.5 months and after every dose change.  She would prefer not to start medication unless absolutely needed. -For now, I plan to see Catherine Lowery back in 6 months but will be in touch about [place]y labs  2.  Multiple thyroid nodules -She denies neck compression symptoms -Also, on palpation, thyroid size appears to be smaller than at last visit.  We did discuss that after RAI treatment, the size of the thyroid decreases. -We will recheck another ultrasound now and, if nodules are stable, no intervention is needed and we can continue to follow [place]y clinically  Orders Placed This Encounter  Procedures  . US THYROID   Component     Latest Ref Rng & Units 11/21/2019  TSH     0.35 - 4.50 uIU/mL 4.56 (H)  T4,Free(Direct)     0.60 - 1.60 ng/dL 0.79  Triiodothyronine,Free,Serum     2.3 - 4.2 pg/mL 3.5   TSH now only slightly above the ULN  With normal  free thyroid hormones. No need to start levothyroxine for now, I will repeat [place]y TFTs when she returns to the clinic in 6 months.  [name], MD PhD [facility] Endocrinology

## 2019-11-24 ENCOUNTER — Telehealth: Payer: Self-pay

## 2019-11-24 NOTE — Telephone Encounter (Signed)
-----   Message from [name], MD sent at 11/21/2019 12:46 PM EDT ----- [place]y, can you please call pt:  TSH now only slightly above the ULN  with free thyroid hormones. No need to start levothyroxine for now, I will repeat her TFTs when she returns to the clinic in 6 months.

## 2019-11-24 NOTE — Telephone Encounter (Signed)
Notified patient of message from Dr. Gherghe, patient expressed understanding and agreement. No further questions.  

## 2019-11-25 ENCOUNTER — Ambulatory Visit
Admission: RE | Admit: 2019-11-25 | Discharge: 2019-11-25 | Disposition: A | Discharge status: Home / Self Care | Payer: Medicare HMO | Source: Ambulatory Visit | Attending: Internal Medicine | Admitting: Internal Medicine

## 2019-11-25 DIAGNOSIS — E042 Nontoxic multinodular goiter: Secondary | ICD-10-CM

## 2019-11-25 DIAGNOSIS — E041 Nontoxic single thyroid nodule: Secondary | ICD-10-CM | POA: Diagnosis not present

## 2019-12-01 ENCOUNTER — Telehealth: Payer: Self-pay

## 2019-12-01 NOTE — Telephone Encounter (Signed)
-----   Message from Renato Shin, MD sent at 11/28/2019  5:17 PM EDT ----- please contact patient:  Good results.  No further testing or treatment is needed now.

## 2019-12-01 NOTE — Telephone Encounter (Signed)
RESULTS  Results were reviewed by Dr. Ellison. A letter has been mailed to pt home address. For future reference, letter can be found in Epic. 

## 2019-12-03 DIAGNOSIS — N1831 Chronic kidney disease, stage 3a: Secondary | ICD-10-CM | POA: Diagnosis not present

## 2019-12-03 DIAGNOSIS — E559 Vitamin D deficiency, unspecified: Secondary | ICD-10-CM | POA: Diagnosis not present

## 2019-12-03 DIAGNOSIS — I129 Hypertensive chronic kidney disease with stage 1 through stage 4 chronic kidney disease, or unspecified chronic kidney disease: Secondary | ICD-10-CM | POA: Diagnosis not present

## 2019-12-03 DIAGNOSIS — C50919 Malignant neoplasm of unspecified site of unspecified female breast: Secondary | ICD-10-CM | POA: Diagnosis not present

## 2019-12-06 ENCOUNTER — Other Ambulatory Visit: Payer: Self-pay | Admitting: Family Medicine

## 2019-12-10 ENCOUNTER — Telehealth: Payer: Self-pay | Admitting: Family Medicine

## 2019-12-10 NOTE — Progress Notes (Signed)
  Chronic Care Management   Outreach Note  12/10/2019 Name: Catherine [place]y MRN: [id number] DOB: Jun 09, 1945  Referred by: Alycia Rossetti, MD Reason for referral : No chief complaint on file.   An unsuccessful telephone outreach was attempted today. The patient was referred to the pharmacist for assistance with care management and care coordination.   Follow Up Plan:   Carley Perdue [employer] Scheduler

## 2019-12-17 ENCOUNTER — Telehealth: Payer: Self-pay | Admitting: Family Medicine

## 2019-12-17 NOTE — Progress Notes (Signed)
  Chronic Care Management   Note  12/17/2019 Name: Catherine Lowery MRN: [id number] DOB: October 07, 1945  Catherine Lowery is a 74 y.o. year old female who is a primary care patient of [place], Modena Nunnery, MD. I reached out to Joette Catching by phone today in response to a referral sent by Ms. Wandra Arthurs [place]'s PCP, Buelah Manis, Modena Nunnery, MD.   Ms. Tinner was given information about Chronic Care Management services today including:  1. CCM service includes personalized support from designated clinical staff supervised by her physician, including individualized plan of care and coordination with other care providers 2. 24/7 contact phone numbers for assistance for urgent and routine care needs. 3. Service will only be billed when office clinical staff spend 20 minutes or more in a month to coordinate care. 4. Only one practitioner may furnish and bill the service in a calendar month. 5. The patient may stop CCM services at any time (effective at the end of the month) by phone call to the office staff.   Patient agreed to services and verbal consent obtained.   Follow up plan:   Carley Perdue [employer] Scheduler

## 2020-01-14 ENCOUNTER — Other Ambulatory Visit: Payer: Self-pay | Admitting: Family Medicine

## 2020-01-16 ENCOUNTER — Telehealth: Payer: Self-pay | Admitting: Family Medicine

## 2020-01-16 NOTE — Progress Notes (Signed)
  Chronic Care Management   Note  01/16/2020 Name: Catherine Lowery MRN: [id number] DOB: [birth year]  Catherine Lowery is a 74 y.o. year old female who is a primary care patient of [place], Modena Nunnery, MD. I reached out to Joette Catching by phone today in response to a referral sent by [name]'s PCP, Buelah Manis, Modena Nunnery, MD.   [name] was given information about Chronic Care Management services today including:  1. CCM service includes personalized support from designated clinical staff supervised by her physician, including individualized plan of care and coordination with other care providers 2. 24/7 contact phone numbers for assistance for urgent and routine care needs. 3. Service will only be billed when office clinical staff spend 20 minutes or more in a month to coordinate care. 4. Only one practitioner may furnish and bill the service in a calendar month. 5. The patient may stop CCM services at any time (effective at the end of the month) by phone call to the office staff.   Patient agreed to services and verbal consent obtained.   Follow up plan:   Carley Perdue [employer] Scheduler

## 2020-01-22 ENCOUNTER — Other Ambulatory Visit: Payer: Self-pay | Admitting: *Deleted

## 2020-01-22 DIAGNOSIS — M1611 Unilateral primary osteoarthritis, right hip: Secondary | ICD-10-CM

## 2020-01-22 DIAGNOSIS — R609 Edema, unspecified: Secondary | ICD-10-CM

## 2020-01-22 DIAGNOSIS — E559 Vitamin D deficiency, unspecified: Secondary | ICD-10-CM

## 2020-01-22 DIAGNOSIS — I10 Essential (primary) hypertension: Secondary | ICD-10-CM

## 2020-01-22 DIAGNOSIS — G250 Essential tremor: Secondary | ICD-10-CM

## 2020-01-22 DIAGNOSIS — E0520 Thyrotoxicosis with toxic multinodular goiter without thyrotoxic crisis or storm: Secondary | ICD-10-CM

## 2020-01-22 DIAGNOSIS — R001 Bradycardia, unspecified: Secondary | ICD-10-CM

## 2020-01-22 DIAGNOSIS — Z780 Asymptomatic menopausal state: Secondary | ICD-10-CM

## 2020-01-22 DIAGNOSIS — E782 Mixed hyperlipidemia: Secondary | ICD-10-CM

## 2020-01-26 NOTE — Chronic Care Management (AMB) (Signed)
Chronic Care Management Pharmacy  Name: Catherine Lowery  MRN: [id number] DOB: [birth year]   Chief Complaint/ HPI  Catherine Lowery,  74 y.o. , female presents for their Initial CCM visit with the clinical pharmacist In office.  PCP : Alycia Rossetti, MD  Their chronic conditions include: HTN, HLD, Tobacco Abuse, Vitamin D deficiency.  Office Visits: 09/17/2019 [facility])   No longer following with endocrinology so thyroid labs need to be done once yearly at PCP  She is dealing with a lot of stress due to her adult son with sickle cell anemia being in the hospital  TSH, T3, and T4 ordered  Consult Visit: none recent  Medications: Outpatient Encounter Medications as of 01/28/2020  Medication Sig  . amLODipine (NORVASC) 10 MG tablet Take 10 mg by mouth daily.  Marland Kitchen anastrozole (ARIMIDEX) 1 MG tablet Take 1 tablet (1 mg total) by mouth daily.  Marland Kitchen aspirin (ASPIRIN ADULT) 325 MG tablet enteric coated; may use OTC  . atorvastatin (LIPITOR) 40 MG tablet TAKE 1 TABLET EVERY DAY  . CVS D3 50 MCG (2000 UT) CAPS TAKE 1 CAPSULE BY MOUTH EVERY DAY  . primidone (MYSOLINE) 50 MG tablet TAKE 2 TABLETS EVERY MORNING  . propranolol (INDERAL) 40 MG tablet Take 0.5 tablets (20 mg total) by mouth 2 (two) times daily.  . VOLTAREN 1 % GEL APPLY TOPICALLY 4 TIMES DAILY AS NEEDED   No facility-administered encounter medications on file as of 01/28/2020.     Current Diagnosis/Assessment:   Emergency planning/management officer Strain: Low Risk   . Difficulty of Paying Living Expenses: Not very hard     Goals Addressed            This Visit's Progress   . Track and Manage My Blood Pressure       Follow Up Date: 6 months   - check blood pressure weekly - write blood pressure results in a log or diary    Why is this important?   You won't feel high blood pressure, but it can still hurt your blood vessels.  High blood pressure can cause heart or kidney problems. It can also cause a stroke.  Making  lifestyle changes like losing a little weight or eating less salt will help.  Checking your blood pressure at home and at different times of the day can help to control blood pressure.  If the doctor prescribes medicine remember to take it the way the doctor ordered.  Call the office if you cannot afford the medicine or if there are questions about it.     Notes: Contact Provider with BP > 140/90 or swelling noted       Hypertension   BP goal is:  <140/90  Office blood pressures are  BP Readings from Last 3 Encounters:  11/21/19 118/70  09/17/19 138/62  07/01/19 (!) 142/70   Patient checks BP at home 1-2x per week Patient home BP readings are ranging: no logs available "WNL" per patient,  117/56 was only reading she could remember  Patient has failed these meds in the past: none noted Patient is currently controlled on the following medications:  . Amlodipine 33m daily  She denies swelling, headaches, dizziness.  Reports BP has been very well controlled after switching amlodipine to 140m No exercise outside of normal housework.  Plan  Continue current medications     Hyperlipidemia   LDL goal < 100  Lipid Panel     Component Value Date/Time   CHOL 136  05/19/2019 1034   TRIG 129 05/19/2019 1034   HDL 32 (L) 05/19/2019 1034   LDLCALC 81 05/19/2019 1034    Hepatic Function Latest Ref Rng & Units 06/23/2019 05/19/2019 01/27/2019  Total Protein 6.5 - 8.1 g/dL 7.1 6.8 7.2  Albumin 3.5 - 5.0 g/dL 3.8 - 3.9  AST 15 - 41 U/L _0 ALT 0 - 44 U/L _1 Alk Phosphatase 38 - 126 U/L 84 - 73  Total Bilirubin 0.3 - 1.2 mg/dL 0.3 0.5 0.5     The 10-year ASCVD risk score Mikey Bussing DC Jr., et al., 2013) is: 15.1%   Values used to calculate the score:     Age: 19 years     Sex: Female     Is Non-Hispanic African American: Yes     Diabetic: No     Tobacco smoker: Yes     Systolic Blood Pressure: 294 mmHg     Is BP treated: Yes     HDL Cholesterol: 32 mg/dL     Total  Cholesterol: 136 mg/dL   Patient has failed these meds in past: none noted Patient is currently controlled on the following medications:  . Atorvastatin 70m  Denies myalgias, takes daily as recommended.  LDL and TC all controlled at last check.  Recommend repeat panel 1 year from last.  Plan  Continue current medications, follow up lipids as previous.  Tobacco    Eosinophil count:   Lab Results  Component Value Date/Time   EOSPCT 4 06/23/2019 03:24 PM  %                               Eos (Absolute):  Lab Results  Component Value Date/Time   EOSABS 0.2 06/23/2019 03:24 PM    Tobacco Status:  Social History   Tobacco Use  Smoking Status Current Every Day Smoker  . Packs/day: 0.50  . Years: 20.00  . Pack years: 10.00  Smokeless Tobacco Never Used  Tobacco Comment   06/06/16 Pt stated "I'm trying to quit"    Patient has failed these meds in past: none noted   Every day smoker, continued to encourage cessation.   Plan  Continue to counsel at all visits.  Vitamin D Deficiency   Last Vitamin D 05/22/2017 - 37  Patient has failed these meds in past: none noted Patient is currently controlled on the following medications:  .Marland KitchenVitamin D 2000 IU daily  No recent vitamin D on file.  Recommend repeat level to determine control. Plan  Continue current medications, recommend repeat Vit D.  Vaccines   Reviewed and discussed patient's vaccination history.    Immunization History  Administered Date(s) Administered  . Influenza Whole 01/29/2013, 02/09/2014  . Influenza, High Dose Seasonal PF 03/13/2017  . Influenza, Quadrivalent, Recombinant, Inj, Pf 02/25/2019  . Influenza,inj,Quad PF,6+ Mos 01/22/2018, 01/11/2019  . Influenza-Unspecified 03/22/2015  . PFIZER SARS-COV-2 Vaccination 06/13/2019, 07/08/2019  . PPD Test 12/31/2015  . Pneumococcal Conjugate-13 03/22/2015  . Pneumococcal Polysaccharide-23 05/14/2009, 01/29/2013, 02/09/2014    Plan  Recommended  patient receive COVID-19 booster, flu shot in office/pharmacy.  Medication Management   . Miscellaneous medications:  o Anastrazole 187mo Primidone 5078m Propranolol 62m24mOTC's: ASA 325mg53mly . Patient currently uses [facility] order pharmacy.   . Patient reports using pill box method to organize medications and promote adherence. . Patient denies missed doses of medication.   Antwan Bribiesca  Rosana Hoes, PharmD Clinical Pharmacist [place] [phone]

## 2020-01-27 ENCOUNTER — Telehealth: Payer: Medicare HMO

## 2020-01-28 ENCOUNTER — Ambulatory Visit: Payer: Medicare HMO | Admitting: Pharmacist

## 2020-01-28 DIAGNOSIS — E782 Mixed hyperlipidemia: Secondary | ICD-10-CM

## 2020-01-28 DIAGNOSIS — I10 Essential (primary) hypertension: Secondary | ICD-10-CM

## 2020-01-28 DIAGNOSIS — Z720 Tobacco use: Secondary | ICD-10-CM

## 2020-01-28 NOTE — Patient Instructions (Addendum)
Visit Information Thank you for meeting with me today!  I look forward to working with you to help you meet all of your healthcare goals and answer any questions you may have.  Feel free to contact me anytime!  Goals Addressed            This Visit's Progress   . Track and Manage My Blood Pressure       Follow Up Date: 6 months   - check blood pressure weekly - write blood pressure results in a log or diary    Why is this important?   You won't feel high blood pressure, but it can still hurt your blood vessels.  High blood pressure can cause heart or kidney problems. It can also cause a stroke.  Making lifestyle changes like losing a little weight or eating less salt will help.  Checking your blood pressure at home and at different times of the day can help to control blood pressure.  If the doctor prescribes medicine remember to take it the way the doctor ordered.  Call the office if you cannot afford the medicine or if there are questions about it.     Notes: Contact Provider with BP > 140/90 or swelling noted       Ms. Beavin was given information about Chronic Care Management services today including:  1. CCM service includes personalized support from designated clinical staff supervised by her physician, including individualized plan of care and coordination with other care providers 2. 24/7 contact phone numbers for assistance for urgent and routine care needs. 3. Standard insurance, coinsurance, copays and deductibles apply for chronic care management only during months in which we provide at least 20 minutes of these services. Most insurances cover these services at 100%, however patients may be responsible for any copay, coinsurance and/or deductible if applicable. This service may help you avoid the need for more expensive face-to-face services. 4. Only one practitioner may furnish and bill the service in a calendar month. 5. The patient may stop CCM services at any time  (effective at the end of the month) by phone call to the office staff.  Patient agreed to services and verbal consent obtained.   The patient verbalized understanding of instructions provided today and agreed to receive a mailed copy of patient instruction and/or educational materials. Telephone follow up appointment with pharmacy team member scheduled for: 6 months Beverly Milch, PharmD Clinical Pharmacist [facility] [phone]   Coping with Quitting Smoking  Quitting smoking is a physical and mental challenge. You will face cravings, withdrawal symptoms, and temptation. Before quitting, work with your health care provider to make a plan that can help you cope. Preparation can help you quit and keep you from giving in. How can I cope with cravings? Cravings usually last for 5-10 minutes. If you get through it, the craving will pass. Consider taking the following actions to help you cope with cravings:  Keep your mouth busy: ? Chew sugar-free gum. ? Suck on hard candies or a straw. ? Brush your teeth.  Keep your hands and body busy: ? Immediately change to a different activity when you feel a craving. ? Squeeze or play with a ball. ? Do an activity or a hobby, like making bead jewelry, practicing needlepoint, or working with wood. ? Mix up your normal routine. ? Take a short exercise break. Go for a quick walk or run up and down stairs. ? Spend time in public places  where smoking is not allowed.  Focus on doing something kind or helpful for someone else.  Call a friend or family member to talk during a craving.  Join a support group.  Call a quit line, such as 1-800-QUIT-NOW.  Talk with your health care provider about medicines that might help you cope with cravings and make quitting easier for you. How can I deal with withdrawal symptoms? Your body may experience negative effects as it tries to get used to not having nicotine in the system. These  effects are called withdrawal symptoms. They may include:  Feeling hungrier than normal.  Trouble concentrating.  Irritability.  Trouble sleeping.  Feeling depressed.  Restlessness and agitation.  Craving a cigarette. To manage withdrawal symptoms:  Avoid places, people, and activities that trigger your cravings.  Remember why you want to quit.  Get plenty of sleep.  Avoid coffee and other caffeinated drinks. These may worsen some of your symptoms. How can I handle social situations? Social situations can be difficult when you are quitting smoking, especially in the first few weeks. To manage this, you can:  Avoid parties, bars, and other social situations where people might be smoking.  Avoid alcohol.  Leave right away if you have the urge to smoke.  Explain to your family and friends that you are quitting smoking. Ask for understanding and support.  Plan activities with friends or family where smoking is not an option. What are some ways I can cope with stress? Wanting to smoke may cause stress, and stress can make you want to smoke. Find ways to manage your stress. Relaxation techniques can help. For example:  Breathe slowly and deeply, in through your nose and out through your mouth.  Listen to soothing, relaxing music.  Talk with a family member or friend about your stress.  Light a candle.  Soak in a bath or take a shower.  Think about a peaceful place. What are some ways I can prevent weight gain? Be aware that many people gain weight after they quit smoking. However, not everyone does. To keep from gaining weight, have a plan in place before you quit and stick to the plan after you quit. Your plan should include:  Having healthy snacks. When you have a craving, it may help to: ? Eat plain popcorn, crunchy carrots, celery, or other cut vegetables. ? Chew sugar-free gum.  Changing how you eat: ? Eat small portion sizes at meals. ? Eat 4-6 small meals  throughout the day instead of 1-2 large meals a day. ? Be mindful when you eat. Do not watch television or do other things that might distract you as you eat.  Exercising regularly: ? Make time to exercise each day. If you do not have time for a long workout, do short bouts of exercise for 5-10 minutes several times a day. ? Do some form of strengthening exercise, like weight lifting, and some form of aerobic exercise, like running or swimming.  Drinking plenty of water or other low-calorie or no-calorie drinks. Drink 6-8 glasses of water daily, or as much as instructed by your health care provider. Summary  Quitting smoking is a physical and mental challenge. You will face cravings, withdrawal symptoms, and temptation to smoke again. Preparation can help you as you go through these challenges.  You can cope with cravings by keeping your mouth busy (such as by chewing gum), keeping your body and hands busy, and making calls to family, friends, or a helpline for  people who want to quit smoking.  You can cope with withdrawal symptoms by avoiding places where people smoke, avoiding drinks with caffeine, and getting plenty of rest.  Ask your health care provider about the different ways to prevent weight gain, avoid stress, and handle social situations. This information is not intended to replace advice given to you by your health care provider. Make sure you discuss any questions you have with your health care provider. Document Revised: 03/30/2017 Document Reviewed: 04/14/2016 Elsevier Patient Education  2020 [employer].

## 2020-02-26 ENCOUNTER — Inpatient Hospital Stay: Payer: Medicare HMO | Attending: Oncology | Admitting: Oncology

## 2020-02-26 ENCOUNTER — Inpatient Hospital Stay: Payer: Medicare HMO

## 2020-02-26 ENCOUNTER — Other Ambulatory Visit: Payer: Self-pay

## 2020-02-26 VITALS — BP 133/66 | HR 61 | Temp 97.5°F | Resp 17 | Ht 69.0 in | Wt 174.8 lb

## 2020-02-26 DIAGNOSIS — Z8042 Family history of malignant neoplasm of prostate: Secondary | ICD-10-CM | POA: Insufficient documentation

## 2020-02-26 DIAGNOSIS — Z79899 Other long term (current) drug therapy: Secondary | ICD-10-CM | POA: Diagnosis not present

## 2020-02-26 DIAGNOSIS — Z79811 Long term (current) use of aromatase inhibitors: Secondary | ICD-10-CM | POA: Diagnosis not present

## 2020-02-26 DIAGNOSIS — Z7982 Long term (current) use of aspirin: Secondary | ICD-10-CM | POA: Insufficient documentation

## 2020-02-26 DIAGNOSIS — Z803 Family history of malignant neoplasm of breast: Secondary | ICD-10-CM | POA: Diagnosis not present

## 2020-02-26 DIAGNOSIS — Z170 Estrogen receptor positive status [ER+]: Secondary | ICD-10-CM | POA: Diagnosis not present

## 2020-02-26 DIAGNOSIS — Z801 Family history of malignant neoplasm of trachea, bronchus and lung: Secondary | ICD-10-CM | POA: Insufficient documentation

## 2020-02-26 DIAGNOSIS — Z8041 Family history of malignant neoplasm of ovary: Secondary | ICD-10-CM | POA: Insufficient documentation

## 2020-02-26 DIAGNOSIS — C50311 Malignant neoplasm of lower-inner quadrant of right female breast: Secondary | ICD-10-CM | POA: Insufficient documentation

## 2020-02-26 DIAGNOSIS — Z85850 Personal history of malignant neoplasm of thyroid: Secondary | ICD-10-CM | POA: Diagnosis not present

## 2020-02-26 DIAGNOSIS — Z8249 Family history of ischemic heart disease and other diseases of the circulatory system: Secondary | ICD-10-CM | POA: Insufficient documentation

## 2020-02-26 DIAGNOSIS — N189 Chronic kidney disease, unspecified: Secondary | ICD-10-CM | POA: Insufficient documentation

## 2020-02-26 DIAGNOSIS — Z8349 Family history of other endocrine, nutritional and metabolic diseases: Secondary | ICD-10-CM | POA: Insufficient documentation

## 2020-02-26 DIAGNOSIS — Z791 Long term (current) use of non-steroidal anti-inflammatories (NSAID): Secondary | ICD-10-CM | POA: Insufficient documentation

## 2020-02-26 DIAGNOSIS — F17210 Nicotine dependence, cigarettes, uncomplicated: Secondary | ICD-10-CM | POA: Diagnosis not present

## 2020-02-26 LAB — COMPREHENSIVE METABOLIC PANEL
ALT: 18 U/L (ref 0–44)
AST: 21 U/L (ref 15–41)
Albumin: 3.8 g/dL (ref 3.5–5.0)
Alkaline Phosphatase: 93 U/L (ref 38–126)
Anion gap: 9 (ref 5–15)
BUN: 17 mg/dL (ref 8–23)
CO2: 25 mmol/L (ref 22–32)
Calcium: 9.6 mg/dL (ref 8.9–10.3)
Chloride: 106 mmol/L (ref 98–111)
Creatinine, Ser: 1.09 mg/dL — ABNORMAL HIGH (ref 0.44–1.00)
GFR, Estimated: 53 mL/min — ABNORMAL LOW (ref >60)
Glucose, Bld: 105 mg/dL — ABNORMAL HIGH (ref 70–99)
Potassium: 4.1 mmol/L (ref 3.5–5.1)
Sodium: 140 mmol/L (ref 135–145)
Total Bilirubin: 0.5 mg/dL (ref 0.3–1.2)
Total Protein: 7.6 g/dL (ref 6.5–8.1)

## 2020-02-26 LAB — CBC WITH DIFFERENTIAL/PLATELET
Abs Immature Granulocytes: 0.02 10*3/uL (ref 0.00–0.07)
Basophils Absolute: 0.1 10*3/uL (ref 0.0–0.1)
Basophils Relative: 1 %
Eosinophils Absolute: 0.3 10*3/uL (ref 0.0–0.5)
Eosinophils Relative: 5 %
HCT: 36.6 % (ref 36.0–46.0)
Hemoglobin: 12.1 g/dL (ref 12.0–15.0)
Immature Granulocytes: 0 %
Lymphocytes Relative: 37 %
Lymphs Abs: 1.8 10*3/uL (ref 0.7–4.0)
MCH: 23.6 pg — ABNORMAL LOW (ref 26.0–34.0)
MCHC: 33.1 g/dL (ref 30.0–36.0)
MCV: 71.3 fL — ABNORMAL LOW (ref 80.0–100.0)
Monocytes Absolute: 0.5 10*3/uL (ref 0.1–1.0)
Monocytes Relative: 10 %
Neutro Abs: 2.2 10*3/uL (ref 1.7–7.7)
Neutrophils Relative %: 47 %
Platelets: 199 10*3/uL (ref 150–400)
RBC: 5.13 MIL/uL — ABNORMAL HIGH (ref 3.87–5.11)
RDW: 15.1 % (ref 11.5–15.5)
WBC: 4.9 10*3/uL (ref 4.0–10.5)
nRBC: 0 % (ref 0.0–0.2)

## 2020-02-26 MED ORDER — ANASTROZOLE 1 MG PO TABS
1.0000 mg | ORAL_TABLET | Freq: Every day | ORAL | 4 refills | Status: DC
Start: 2020-02-26 — End: 2021-06-02

## 2020-02-26 NOTE — Progress Notes (Signed)
Catherine Lowery  Telephone:([phone]) [phone] Fax:(336) [phone]     ID: Catherine Lowery DOB: [birth year]  MR#: [id number]  BJS#:[id number]  Patient Care Team: Alycia Rossetti, MD as PCP - General (Family Medicine) Debara Pickett [name], MD as PCP - Cardiology (Cardiology) Jakiya Bookbinder, Virgie Dad, MD as Consulting Physician (Oncology) Gery Pray, MD as Consulting Physician (Radiation Oncology) Renette Butters, MD as Attending Physician (Orthopedic Surgery) [name], MD as Consulting Physician (General Surgery) Milus Banister, MD as Attending Physician (Gastroenterology) Penni Bombard, MD as Consulting Physician (Neurology) Estanislado Emms, MD as Consulting Physician (Nephrology) Edythe Clarity, [facility] as Pharmacist (Pharmacist) OTHER MD:   CHIEF COMPLAINT: 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   She continues on anastrozole.  She is no longer having any hot flashes.  There is simply "stopped".  She has no other side effects from this.  Catherine Lowery's last bone density screening on 02/26/2018, showed a T-score of 1.4, which is considered normal.    Her mammogram had been scheduled in the same time as her dental work so it has been rescheduled for November   REVIEW OF SYSTEMS: Catherine Lowery unfortunately continues to smoke.  She gets a little anxious when she comes here so she smokes a little bit more.  Her son at home also smokes.  She is up-to-date on Pfizer and has received a booster.  A detailed review of systems today was otherwise noncontributory  COVID 19 VACCINATION STATUS: S/P pFIZER X 3, BOOSTER MID oct 2021   HISTORY OF CURRENT ILLNESS: From the original intake note:  Catherine Lowery had routine screening mammography on 02/26/2018 showing a possible abnormality in the right breast. She  underwent unilateral right diagnostic mammography with tomography and right breast ultrasonography at [facility] on 03/07/2018 showing: Breast Density Category B. On mammography, there is a new 1.5 cm irregular architectural distortion with a spiculated margin in the right breast at 5 o'clock anterior depth. On ultrasound, there is a 2.7 cm irregular solid mass in the right breast at 5 o'clock middle depth. This irregular mass displays posterior acoustic shadowing. No significant abnormalities were seen sonographically in the right axilla.  Accordingly on 03/18/2018 she proceeded to biopsy of the right breast area in question. The pathology from this procedure showed ([id number]): Invasive mammary carcinoma e-cadherin negative, grade II-III. 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 Prognostic indicators significant for: estrogen receptor, 95% positive and progesterone receptor, 95% positive, both with strong staining intensity. Proliferation marker Ki67 at 5%. HER2 negative by immunohistochemistry (1+).  On 04/10/2018 the patient underwent bilateral breast MRI with and without contrast showing a 2.3 cm spiculated enhancing mass associated with non-masslike enhancement, the total abnormal area measuring 4.6 cm.  There was no evidence of lymph node abnormality and the contralateral breast was benign appearing  The patient's subsequent history is as detailed below.   PAST MEDICAL HISTORY: Past Medical History:  Diagnosis Date  . Cancer ([place]) 03/2018   right breast cancer  . Chronic kidney disease    CKD  . DJD (degenerative joint disease)    LEFT HIP  . Family history of breast cancer   . Family history of ovarian cancer   . Family history of prostate cancer   . Family history of prostate cancer   . H/O migraine 07/01/2019  . Hyperlipidemia  takes Simvasatin daily  . Hypertension    takes Propranolol and HCTZ daioly  . Hyperthyroidism   . Pneumonia 10/2015  . PONV  (postoperative nausea and vomiting)    when ether used  . Shortness of breath dyspnea    with ambulation  . Tremors of nervous system    takes Primidone daily    PAST SURGICAL HISTORY: Past Surgical History:  Procedure Laterality Date  . BOne Spur     left heel  . BREAST LUMPECTOMY WITH RADIOACTIVE SEED AND SENTINEL LYMPH NODE BIOPSY Right 05/07/2018   Procedure: RIGHT BREAST LUMPECTOMY WITH RADIOACTIVE SEED AND RIGHT  SENTINEL LYMPH NODE MAPPING;  Surgeon: [name], MD;  Location: [place];  Service: General;  Laterality: Right;  . COLONOSCOPY    . JOINT REPLACEMENT Bilateral    knee  . KNEE SURGERY    . RE-EXCISION OF BREAST LUMPECTOMY Right 06/05/2018   Procedure: RE-EXCISION OF RIGHT BREAST LUMPECTOMY;  Surgeon: [name], MD;  Location: [place];  Service: General;  Laterality: Right;  . ROTATOR CUFF REPAIR Bilateral   . 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 TOTAL HIP ARTHROPLASTY Left 12/29/2015   Procedure: TOTAL HIP ARTHROPLASTY ANTERIOR APPROACH;  Surgeon: Ninetta Lights, MD;  Location: [place];  Service: Orthopedics;  Laterality: Left;    FAMILY HISTORY Family History  Problem Relation Age of Onset  . Hypertension Mother   . Heart disease Mother   . Stroke Mother   . Multiple myeloma Father 55  . Thyroid cancer Sister        dx 40's/50's  . Cancer Paternal Aunt        unknown  . Prostate cancer Paternal Uncle        dx >50- cancer was the cause of his death  . Ovarian cancer Paternal Aunt   . Throat cancer Paternal Uncle   . Breast cancer Cousin        dx >50  . Cancer Cousin        type unk  . Cancer Cousin        type unk  . Cancer Cousin        type unk   She notes that her father died from a multiple myeloma at age 64. Patients' mother died from stroke at age 73. The patient has 1 brother and 2 sisters. One paternal aunt had ovarian cancer.  One cousin had breast cancer, and her 65s. Another paternal aunt  had cancer, type unknown to the patient, and a paternal uncle had prostate cancer.    GYNECOLOGIC HISTORY:  Menarche: 74 years old Age at first live birth: 74 years old GX P: 1 LMP: Patient's last menstrual period was 01/24/2000. 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 Before that, she has been employed at [employer], [employer], and as an elderly caregiver. 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 She is separated, and has not spoken to her husband, Catherine Lowery, in over 10 years. She lives with her only son, Catherine Lowery, who is disabled secondary to SCD and multiple surgeries. She has no pets. She attends [employer] [facility] [employer].    ADVANCED DIRECTIVES: Her sister, Catherine Lowery, is Catherine Lowery's medical power of attorney. Catherine Lowery can be reached at [phone]. [  Janyra has stated that her husband, Catherine Lowery, is not to have access to her medical records or to make decisions on her behalf. ]   HEALTH MAINTENANCE: Social History   Tobacco Use  . Smoking status: Current Every Day Smoker    Packs/day: 0.50    Years: 20.00    Pack years: 10.00  . Smokeless tobacco: Never Used  . Tobacco comment: 06/06/16 Pt stated "I'm trying to quit"  Substance Use Topics  . Alcohol use: No    Alcohol/week: 0.0 standard drinks  . Drug use: No     Colonoscopy: 01/2015  PAP:   Bone density:  02/26/2018: T-score 1.4   Allergies  Allergen Reactions  . Morphine And Related Itching  . Other Itching and Nausea And Vomiting    "PAIN MEDICINES"  . Percocet [Oxycodone-Acetaminophen] Nausea And Vomiting    Current Outpatient Medications  Medication Sig Dispense Refill  . amLODipine (NORVASC) 10 MG tablet Take 10 mg by mouth daily.    Marland Kitchen anastrozole (ARIMIDEX) 1 MG tablet Take 1 tablet (1 mg total) by  mouth daily. 90 tablet 4  . aspirin (ASPIRIN ADULT) 325 MG tablet enteric coated; may use OTC 30 tablet 0  . atorvastatin (LIPITOR) 40 MG tablet TAKE 1 TABLET EVERY DAY 90 tablet 2  . [facility] D3 50 MCG (2000 UT) CAPS TAKE 1 CAPSULE BY MOUTH EVERY DAY 100 capsule 11  . primidone (MYSOLINE) 50 MG tablet TAKE 2 TABLETS EVERY MORNING 180 tablet 2  . propranolol (INDERAL) 40 MG tablet Take 0.5 tablets (20 mg total) by mouth 2 (two) times daily. 180 tablet 2  . VOLTAREN 1 % GEL APPLY TOPICALLY 4 TIMES DAILY AS NEEDED 100 g 0   No current facility-administered medications for this visit.    OBJECTIVE: African-American woman in no acute distress  Vitals:   02/26/20 1308  BP: 133/66  Pulse: 61  Resp: 17  Temp: (!) 97.5 F (36.4 C)  SpO2: 100%   Wt Readings from Last 3 Encounters:  02/26/20 174 lb 12.8 oz (79.3 kg)  11/21/19 175 lb (79.4 kg)  09/17/19 178 lb (80.7 kg)   Body mass index is 25.81 kg/m.    ECOG FS:1 - Symptomatic but completely ambulatory  Sclerae unicteric, EOMs intact Wearing a mask No cervical or supraclavicular adenopathy Lungs no rales or rhonchi Heart regular rate and rhythm Abd soft, nontender, positive bowel sounds MSK no focal spinal tenderness, no upper extremity lymphedema Neuro: nonfocal, well oriented, appropriate affect Breasts: The right breast is status post lumpectomy and radiation.  There is mild hyperpigmentation.  There is no evidence of local recurrence.  The left breast is benign.  Both axillae are benign.  LAB RESULTS:  CMP     Component Value Date/Time   NA 141 06/23/2019 1524   K 4.0 06/23/2019 1524   CL 107 06/23/2019 1524   CO2 25 06/23/2019 1524   GLUCOSE 88 06/23/2019 1524   BUN 14 06/23/2019 1524   CREATININE 1.01 (H) 06/23/2019 1524   CREATININE 1.05 (H) 05/19/2019 1034   CALCIUM 8.9 06/23/2019 1524   PROT 7.1 06/23/2019 1524   ALBUMIN 3.8 06/23/2019 1524   AST 17 06/23/2019 1524   ALT 18 06/23/2019 1524   ALKPHOS 84  06/23/2019 1524   BILITOT 0.3 06/23/2019 1524   GFRNONAA 54 (L) 06/23/2019 1524   GFRNONAA 48 (L) 01/23/2018 0804   GFRAA >60 06/23/2019 1524   GFRAA 56 (L) 01/23/2018 0804    No results found  for: TOTALPROTELP, ALBUMINELP, A1GS, A2GS, BETS, BETA2SER, GAMS, MSPIKE, SPEI  No results found for: KPAFRELGTCHN, LAMBDASER, KAPLAMBRATIO  Lab Results  Component Value Date   WBC 4.9 02/26/2020   NEUTROABS 2.2 02/26/2020   HGB 12.1 02/26/2020   HCT 36.6 02/26/2020   MCV 71.3 (L) 02/26/2020   PLT 199 02/26/2020   No results found for: LABCA2  No components found for: LKTGYB638  No results for input(s): INR in the last 168 hours.  No results found for: LABCA2  No results found for: LHT342  No results found for: AJG811  No results found for: XBW620  No results found for: CA2729  No components found for: HGQUANT  No results found for: CEA1 / No results found for: CEA1   No results found for: AFPTUMOR  No results found for: [facility]  Lab Results  Component Value Date   HGBA 70.8 (L) 06/23/2019   HGBA2QUANT 3.6 (H) 06/23/2019   HGBFQUANT 0.0 06/23/2019   HGBSQUAN 25.6 (H) 06/23/2019   (Hemoglobinopathy evaluation)   Lab Results  Component Value Date   LDH 147 06/23/2019    Lab Results  Component Value Date   IRON 57 06/23/2019   TIBC 248 06/23/2019   IRONPCTSAT 23 06/23/2019   (Iron and TIBC)  Lab Results  Component Value Date   FERRITIN 235 06/23/2019    Urinalysis    Component Value Date/Time   COLORURINE YELLOW 09/21/2017 1533   APPEARANCEUR CLOUDY (A) 09/21/2017 1533   LABSPEC 1.015 09/21/2017 1533   PHURINE 5.5 09/21/2017 1533   GLUCOSEU NEGATIVE 09/21/2017 1533   HGBUR TRACE (A) 09/21/2017 1533   BILIRUBINUR SMALL (A) 03/24/2011 1054   KETONESUR NEGATIVE 09/21/2017 1533   PROTEINUR NEGATIVE 09/21/2017 1533   UROBILINOGEN 0.2 03/24/2011 1054   NITRITE NEGATIVE 09/21/2017 1533   LEUKOCYTESUR TRACE (A) 09/21/2017 1533    STUDIES:  No  results found.   ELIGIBLE FOR AVAILABLE RESEARCH PROTOCOL: no   ASSESSMENT: 74 y.o. [employer], [place] woman status post right breast lower inner quadrant biopsy 03/18/2018 for a clinical T2N0, [name], grade 2 or 3, estrogen and progesterone receptor positive, [name], with an MIB-1 of 5%  (1) Genetic testing performed through [facility] Common Hereditary Cancers Panel + Thyroid Cancer Panel on 05/09/2018 showing no deleterious mutations APC, [name], [name], [name], [name], [name], [name], [name], [name], [name], [name], [name], [name], [name], [name], [name], [name], [name], [name], [name], [name], KIT, MEN1, [name], [name], [name], [name], [name], [name], [name], [name], [name], [name], [name], [name], [name], POLE, [name], [name], RAD50, [name], [name], [name], [name], [name], [name], [name], [name], [name], [name], [name], [name], [name], [name], [name], VHL.   (a) Two variants of uncertain significance in the genes [name] c.1176C>T (Silent) and [name] c.1419+4C>T (Intronic) were identified.  (2) right lumpectomy and sentinel lymph node sampling 05/07/2018 showed a pT2 pN1, [name], grade 2, with a positive inferior margin  (a) reexcision scheduled for 06/05/2018  (3) MammaPrint obtained from the definitive surgical sample read as "low risk" predicting a disease-free survival at 5 years of 97.8% without need of chemotherapy.  (4) adjuvant radiation 07/11/2018 - 08/27/2018  (a) 1. Right breast; [name]; 25 fractions of 1.8 Gy for a total of 45 Gy  3. Boost; 6 fractions of 2 Gy for a total of 12 Gy  (5) anastrozole started 10/02/2018  (a) bone density 10/29/219 at [facility] shows a T scoe of +1.4 (normal)  (6) genetic testing 05/18/2018 through the Common Hereditary Cancers Panel + Thyroid [place] panel showed no deleterious mutations in APC, [name], [name], [name], [name],  [name], [name], [name], [name], [name], [name], [name], [name], [name], [name], [name], [name], [name], [name], [name], [name], KIT, MEN1, [name], [name], [name], [name], [name], [name], [name], [name], [name], [name], [name], [name], [name], POLE, [name], [name], RAD50, [name], [name], [name], [name], [name], [name], [name], [name], [name], [name], [name], [name], [name], [name], [name], VHL.  (a) 2 variants of uncertain significance in the genes [name] c.1176C>T (Silent) and [name] c.1419+4C>T (Intronic) were identified.    (b) UPDATE: The [name] c.1419+4C>T (Intronic) VUS has been reclassified to "Likely Benign." The report date is 08/08/2018.  (7) the patient has thalassemia/sickle cell trait: Hemoglobin electrophoresis 06/23/2019 shows hemoglobin 870.8%, [name], hemoglobin A2 elevated at 3.6%   PLAN: Eudora is now close to 2 years out from definitive surgery for breast cancer with no evidence of disease recurrence.  This is very favorable.  She is tolerating anastrozole very well and the plan will be to continue that a total of 5 years.  She is having mammography 03/12/2020.  I have added a bone density at that time.  We should have those results later this year.  At this point I feel comfortable seeing her yearly.  Therefore I will see her next the first week in December 2022  I strongly encouraged her to quit smoking  Total encounter time 25 minutes.  Yvetta Drotar, Virgie Dad, MD  02/26/20 1:15 PM Medical Oncology and Hematology [facility] [place], [place] [address] Tel. [phone]    Fax. [phone]   I, Wilburn Mylar, am acting as scribe for Dr. Virgie Dad. Zafira Munos.  I, [name] MD, have reviewed the above documentation for accuracy and completeness, and I agree with the above.   *Total Encounter Time as defined by the Centers for Medicare and Medicaid Services includes, in addition to the face-to-face time of a patient visit (documented in the note above)  non-face-to-face time: obtaining and reviewing outside history, ordering and reviewing medications, tests or procedures, care coordination (communications with other health care professionals or caregivers) and documentation in the medical record.

## 2020-03-08 ENCOUNTER — Other Ambulatory Visit: Payer: Self-pay | Admitting: Family Medicine

## 2020-03-15 ENCOUNTER — Ambulatory Visit (HOSPITAL_COMMUNITY)
Admission: EM | Admit: 2020-03-15 | Discharge: 2020-03-15 | Disposition: A | Discharge status: Home / Self Care | Payer: Medicare HMO | Attending: Family Medicine | Admitting: Family Medicine

## 2020-03-15 ENCOUNTER — Ambulatory Visit (INDEPENDENT_AMBULATORY_CARE_PROVIDER_SITE_OTHER): Payer: Medicare HMO

## 2020-03-15 ENCOUNTER — Encounter (HOSPITAL_COMMUNITY): Payer: Self-pay

## 2020-03-15 ENCOUNTER — Other Ambulatory Visit: Payer: Self-pay

## 2020-03-15 DIAGNOSIS — R0602 Shortness of breath: Secondary | ICD-10-CM | POA: Diagnosis not present

## 2020-03-15 DIAGNOSIS — R062 Wheezing: Secondary | ICD-10-CM

## 2020-03-15 DIAGNOSIS — R059 Cough, unspecified: Secondary | ICD-10-CM | POA: Diagnosis not present

## 2020-03-15 DIAGNOSIS — Z20822 Contact with and (suspected) exposure to covid-19: Secondary | ICD-10-CM | POA: Diagnosis not present

## 2020-03-15 DIAGNOSIS — F17210 Nicotine dependence, cigarettes, uncomplicated: Secondary | ICD-10-CM | POA: Insufficient documentation

## 2020-03-15 DIAGNOSIS — J069 Acute upper respiratory infection, unspecified: Secondary | ICD-10-CM | POA: Diagnosis not present

## 2020-03-15 MED ORDER — PREDNISONE 20 MG PO TABS: 40.0000 mg | ORAL_TABLET | Freq: Every day | ORAL | 0 refills | Status: DC

## 2020-03-15 MED ORDER — PROMETHAZINE-DM 6.25-15 MG/5ML PO SYRP: 5.0000 mL | ORAL_SOLUTION | Freq: Four times a day (QID) | ORAL | 0 refills | Status: DC | PRN

## 2020-03-15 NOTE — ED Provider Notes (Signed)
[facility]    CSN: [id number] Arrival date & time: 03/15/20  1232      History   Chief Complaint Chief Complaint  Patient presents with  . Cough    HPI Catherine Lowery is a 74 y.o. female.   Patient presenting today with 3 day hx of congestion, sore throat, hacking cough and SOB particularly now with laying down in bed. Denies fever, chills, body aches, abdominal pain, N/V/D. So far taking theraflu without much benefit. Several sick contacts at work recently. Long time cigarette smoker, no known pulmonary diagnoses. Hx of pneumonia most recently last fall.      Past Medical History:  Diagnosis Date  . Cancer ([place]) 03/2018   right breast cancer  . Chronic kidney disease    CKD  . DJD (degenerative joint disease)    LEFT HIP  . Family history of breast cancer   . Family history of ovarian cancer   . Family history of prostate cancer   . Family history of prostate cancer   . H/O migraine 07/01/2019  . Hyperlipidemia    takes Simvasatin daily  . Hypertension    takes Propranolol and HCTZ daioly  . Hyperthyroidism   . Pneumonia 10/2015  . PONV (postoperative nausea and vomiting)    when ether used  . Shortness of breath dyspnea    with ambulation  . Tremors of nervous system    takes Primidone daily    Patient Active Problem List   Diagnosis Date Noted  . H/O migraine 07/01/2019  . Bradycardia 12/17/2018  . Peripheral edema 12/17/2018  . Genetic testing 05/20/2018  . Family history of breast cancer   . Family history of prostate cancer   . Family history of ovarian cancer   . Malignant neoplasm of lower-inner quadrant of right breast of female, estrogen receptor positive ([place]) 04/11/2018  . DNR (do not resuscitate) 05/22/2017  . Benign familial tremor 05/16/2016  . Vitamin D deficiency 03/01/2016  . Primary localized osteoarthritis of left hip 12/29/2015  . Encounter for screening colonoscopy 02/18/2015  . 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  02/04/2015  . Hyperlipidemia 01/12/2015  . Benign essential HTN 01/12/2015  . Tobacco abuse 01/12/2015    Past Surgical History:  Procedure Laterality Date  . BOne Spur     left heel  . BREAST LUMPECTOMY WITH RADIOACTIVE SEED AND SENTINEL LYMPH NODE BIOPSY Right 05/07/2018   Procedure: RIGHT BREAST LUMPECTOMY WITH RADIOACTIVE SEED AND RIGHT  SENTINEL LYMPH NODE MAPPING;  Surgeon: [name], MD;  Location: [facility];  Service: General;  Laterality: Right;  . COLONOSCOPY    . JOINT REPLACEMENT Bilateral    knee  . KNEE SURGERY    . RE-EXCISION OF BREAST LUMPECTOMY Right 06/05/2018   Procedure: RE-EXCISION OF RIGHT BREAST LUMPECTOMY;  Surgeon: [name], MD;  Location: [facility];  Service: General;  Laterality: Right;  . ROTATOR CUFF REPAIR Bilateral   . 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 TOTAL HIP ARTHROPLASTY Left 12/29/2015   Procedure: TOTAL HIP ARTHROPLASTY ANTERIOR APPROACH;  Surgeon: Ninetta Lights, MD;  Location: [facility];  Service: Orthopedics;  Laterality: Left;    OB History   No obstetric history on file.      Home Medications    Prior to Admission medications   Medication Sig Start Date End Date Taking? Authorizing Provider  amLODipine (NORVASC) 10 MG tablet TAKE 1 TABLET BY MOUTH EVERY DAY 03/08/20  Yes Roosevelt, Hull,  MD  aspirin (ASPIRIN ADULT) 325 MG tablet enteric coated; may use OTC 07/01/19  Yes Bates, Crystal A, FNP  atorvastatin (LIPITOR) 40 MG tablet TAKE 1 TABLET EVERY DAY 01/14/20  Yes [place], Modena Nunnery, MD  [facility] D3 50 MCG [phone] UT) CAPS TAKE 1 CAPSULE BY MOUTH EVERY DAY 12/08/19  Yes [place], Modena Nunnery, MD  primidone (MYSOLINE) 50 MG tablet TAKE 2 TABLETS EVERY MORNING 01/14/20  Yes [place], Modena Nunnery, MD  propranolol (INDERAL) 40 MG tablet Take 0.5 tablets (20 mg total) by mouth 2 (two) times daily. 04/10/19  Yes [place], Modena Nunnery, MD  anastrozole (ARIMIDEX) 1 MG tablet Take 1 tablet (1 mg total) by mouth daily.  02/26/20   Magrinat, Virgie Dad, MD  predniSONE (DELTASONE) 20 MG tablet Take 2 tablets (40 mg total) by mouth daily with breakfast. 03/15/20   Volney American, PA-C  promethazine-dextromethorphan (PROMETHAZINE-DM) 6.25-15 MG/5ML syrup Take 5 mLs by mouth 4 (four) times daily as needed for cough. 03/15/20   Volney American, PA-C  VOLTAREN 1 % GEL APPLY TOPICALLY 4 TIMES DAILY AS NEEDED 10/16/16   Alycia Rossetti, MD    Family History Family History  Problem Relation Age of Onset  . Hypertension Mother   . Heart disease Mother   . Stroke Mother   . Multiple myeloma Father 48  . Thyroid cancer Sister        dx 40's/50's  . Cancer Paternal Aunt        unknown  . Prostate cancer Paternal Uncle        dx >50- cancer was the cause of his death  . Ovarian cancer Paternal Aunt   . Throat cancer Paternal Uncle   . Breast cancer Cousin        dx >50  . Cancer Cousin        type unk  . Cancer Cousin        type unk  . Cancer Cousin        type unk    Social History Social History   Tobacco Use  . Smoking status: Current Every Day Smoker    Packs/day: 0.50    Years: 20.00    Pack years: 10.00  . Smokeless tobacco: Never Used  . Tobacco comment: 06/06/16 Pt stated "I'm trying to quit"  Substance Use Topics  . Alcohol use: No    Alcohol/week: 0.0 standard drinks  . Drug use: No     Allergies   Morphine and related, Other, and Percocet [oxycodone-acetaminophen]   Review of Systems Review of Systems PER HPI    Physical Exam Triage Vital Signs ED Triage Vitals  Enc Vitals Group     BP 03/15/20 1323 137/61     Pulse Rate 03/15/20 1323 (!) 57     Resp 03/15/20 1323 20     Temp 03/15/20 1325 97.8 F (36.6 C)     Temp Source 03/15/20 1323 Oral     SpO2 03/15/20 1323 98 %     Weight --      Height --      Head Circumference --      Peak Flow --      Pain Score 03/15/20 1319 0     Pain Loc --      Pain Edu? --      Excl. in [place]? --    No data  found.  Updated Vital Signs BP 137/61 (BP Location: Right Arm)   Pulse (!) 57  Temp 97.8 F (36.6 C) (Oral)   Resp 20   LMP 01/24/2000   SpO2 98%   Visual Acuity Right Eye Distance:   Left Eye Distance:   Bilateral Distance:    Right Eye Near:   Left Eye Near:    Bilateral Near:     Physical Exam Vitals and nursing note reviewed.  Constitutional:      Appearance: Normal appearance. She is not ill-appearing.  HENT:     Head: Atraumatic.     Right Ear: Tympanic membrane normal.     Left Ear: Tympanic membrane normal.     Nose: Rhinorrhea present.     Mouth/Throat:     Mouth: Mucous membranes are moist.     Pharynx: Posterior oropharyngeal erythema present. No oropharyngeal exudate.  Eyes:     Extraocular Movements: Extraocular movements intact.     Conjunctiva/sclera: Conjunctivae normal.  Cardiovascular:     Rate and Rhythm: Normal rate and regular rhythm.     Heart sounds: Normal heart sounds.  Pulmonary:     Effort: Pulmonary effort is normal. No respiratory distress.     Breath sounds: Wheezing (faint, scattered) present. No rales.  Abdominal:     General: Bowel sounds are normal. There is no distension.     Palpations: Abdomen is soft.     Tenderness: There is no abdominal tenderness. There is no guarding.  Musculoskeletal:        General: Normal range of motion.     Cervical back: Normal range of motion and neck supple.  Skin:    General: Skin is warm and dry.  Neurological:     Mental Status: She is alert and oriented to person, place, and time.  Psychiatric:        Mood and Affect: Mood normal.        Thought Content: Thought content normal.        Judgment: Judgment normal.      UC Treatments / Results  Labs (all labs ordered are listed, but only abnormal results are displayed) Labs Reviewed  SARS CORONAVIRUS 2 (TAT 6-24 HRS)    EKG   Radiology DG Chest 2 View  Result Date: 03/15/2020 CLINICAL DATA:  Shortness of breath, wheezing, dry  cough EXAM: CHEST - 2 VIEW COMPARISON:  10/09/2017 FINDINGS: Stable scarring at the right lung base. No acute confluent airspace opacities or effusions. Heart is normal size. No acute bony abnormality. IMPRESSION: Stable scarring at the right lung base. No active disease. Electronically Signed   By: Rolm Baptise M.D.   On: 03/15/2020 15:45    Procedures Procedures (including critical care time)  Medications Ordered in UC Medications - No data to display  Initial Impression / Assessment and Plan / UC Course  I have reviewed the triage vital signs and the nursing notes.  Pertinent labs & imaging results that were available during my care of the patient were reviewed by me and considered in my medical decision making (see chart for details).     CXR neg for acute abnormality today, vitals and exam reassuring. Will tx with prednisone burst, phenergan DM, mucinex, supportive home care. COVID pcr pending, work note given. Discussed strict return precautions for worsening sxs.   Final Clinical Impressions(s) / UC Diagnoses   Final diagnoses:  Viral URI with cough  SOB (shortness of breath)   Discharge Instructions   None    ED Prescriptions    Medication Sig Dispense Auth. Provider   predniSONE (DELTASONE) 20 MG  tablet Take 2 tablets (40 mg total) by mouth daily with breakfast. 10 tablet Volney American, PA-C   promethazine-dextromethorphan (PROMETHAZINE-DM) 6.25-15 MG/5ML syrup Take 5 mLs by mouth 4 (four) times daily as needed for cough. 100 mL Volney American, [place]     PDMP not reviewed this encounter.   Volney American, [place] 03/15/20 1643

## 2020-03-15 NOTE — ED Triage Notes (Signed)
Pt in with c/o strong cough that has been going on for a few days but has gotten worse. States she can't lay down at night because it makes her feel SOB.  Pt took theraflu and sudafed for cough with no relief  And when she starts to cough she can't breathe well.   Has received covid vaccines

## 2020-03-16 ENCOUNTER — Ambulatory Visit: Payer: Medicare HMO | Admitting: Family Medicine

## 2020-03-16 LAB — SARS CORONAVIRUS 2 (TAT 6-24 HRS): SARS Coronavirus 2: NEGATIVE

## 2020-03-22 ENCOUNTER — Ambulatory Visit (INDEPENDENT_AMBULATORY_CARE_PROVIDER_SITE_OTHER): Payer: Medicare HMO | Admitting: Family Medicine

## 2020-03-22 ENCOUNTER — Other Ambulatory Visit: Payer: Self-pay

## 2020-03-22 ENCOUNTER — Encounter: Payer: Self-pay | Admitting: Family Medicine

## 2020-03-22 VITALS — BP 132/64 | HR 76 | Temp 98.0°F | Resp 16 | Ht 69.0 in | Wt 177.0 lb

## 2020-03-22 DIAGNOSIS — E0520 Thyrotoxicosis with toxic multinodular goiter without thyrotoxic crisis or storm: Secondary | ICD-10-CM

## 2020-03-22 DIAGNOSIS — E559 Vitamin D deficiency, unspecified: Secondary | ICD-10-CM | POA: Diagnosis not present

## 2020-03-22 DIAGNOSIS — Z23 Encounter for immunization: Secondary | ICD-10-CM | POA: Diagnosis not present

## 2020-03-22 DIAGNOSIS — E782 Mixed hyperlipidemia: Secondary | ICD-10-CM

## 2020-03-22 DIAGNOSIS — I10 Essential (primary) hypertension: Secondary | ICD-10-CM

## 2020-03-22 NOTE — Progress Notes (Signed)
° °  Subjective:    Patient ID: Catherine Lowery, female    DOB: 09-Nov-1945, 74 y.o.   MRN: [id number]  Patient presents for Follow-up (is fasting)  Pt here to f/u chronic medical problems  Medications reviewed She had recent bronchitis,much better now, has residual cough  Due for flu shot today   HTN- taking norvasc /inderal   Hyperlipidemia- taking lipitor 40mg  once a day    Had recent f/u with oncology for breast cancer history, she scheduled for Mammogram and Bone Density December 16th contiuned on Arimidex   She is followed by endocrinology  For thyroid due to be seen again in Jan   Review Of Systems:  GEN- denies fatigue, fever, weight loss,weakness, recent illness HEENT- denies eye drainage, change in vision, nasal discharge, CVS- denies chest pain, palpitations RESP- denies SOB, +cough, wheeze ABD- denies N/V, change in stools, abd pain GU- denies dysuria, hematuria, dribbling, incontinence MSK- denies joint pain, muscle aches, injury Neuro- denies headache, dizziness, syncope, seizure activity       Objective:    BP 132/64    Pulse 76    Temp 98 F (36.7 C) (Temporal)    Resp 16    Ht 5\' 9"  (1.753 m)    Wt 177 lb (80.3 kg)    LMP 01/24/2000    SpO2 96%    BMI 26.14 kg/m  GEN- NAD, alert and oriented x3 HEENT- PERRL, EOMI, non injected sclera, pink conjunctiva, MMM, oropharynx clear Neck- Supple, no thyromegaly CVS- RRR, no murmur RESP-CTAB ABD-NABS,soft,NT,ND EXT- trace ankle  edema Pulses- Radial, DP- 2+        Assessment & Plan:   Recent bronchitis resolving , no further intervention needed    Problem List Items Addressed This Visit      Unprioritized   Benign essential HTN    Controlled no changes  Reviewed recent NON fasting labs from endocrinology       Hyperlipidemia    Recheck lipids, ldl LESS than 120       Relevant Orders   Lipid panel   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    F/U endocrinology in Jan       Vitamin D deficiency     Continue vitamin D replacement       Relevant Orders   Vitamin D, 25-hydroxy    Other Visit Diagnoses    Need for immunization against influenza    -  Primary   Relevant Orders   Flu Vaccine QUAD High Dose(Fluad) (Completed)      Note: This dictation was prepared with Dragon dictation along with smaller phrase technology. Any transcriptional errors that result from this process are unintentional.

## 2020-03-22 NOTE — Assessment & Plan Note (Signed)
F/U endocrinology in Jan

## 2020-03-22 NOTE — Assessment & Plan Note (Signed)
Recheck lipids, ldl LESS than 120

## 2020-03-22 NOTE — Assessment & Plan Note (Signed)
Continue vitamin D replacement.  

## 2020-03-22 NOTE — Assessment & Plan Note (Signed)
Controlled no changes  Reviewed recent NON fasting labs from endocrinology

## 2020-03-22 NOTE — Patient Instructions (Signed)
F/U Feb for Physical

## 2020-03-23 LAB — LIPID PANEL
Cholesterol: 149 mg/dL (ref <200)
HDL: 45 mg/dL — ABNORMAL LOW (ref >=50)
LDL Cholesterol (Calc): 74 mg/dL (calc)
Non-HDL Cholesterol (Calc): 104 mg/dL (calc) (ref <130)
Total CHOL/HDL Ratio: 3.3 (calc) (ref <5.0)
Triglycerides: 205 mg/dL — ABNORMAL HIGH (ref <150)

## 2020-03-23 LAB — VITAMIN D 25 HYDROXY (VIT D DEFICIENCY, FRACTURES): Vit D, 25-Hydroxy: 51 ng/mL (ref 30–100)

## 2020-04-15 ENCOUNTER — Encounter: Payer: Self-pay | Admitting: Family Medicine

## 2020-04-15 DIAGNOSIS — Z853 Personal history of malignant neoplasm of breast: Secondary | ICD-10-CM | POA: Diagnosis not present

## 2020-04-15 DIAGNOSIS — N6489 Other specified disorders of breast: Secondary | ICD-10-CM | POA: Diagnosis not present

## 2020-04-15 LAB — HM MAMMOGRAPHY

## 2020-04-27 ENCOUNTER — Encounter: Payer: Self-pay | Admitting: *Deleted

## 2020-05-04 DIAGNOSIS — Z20822 Contact with and (suspected) exposure to covid-19: Secondary | ICD-10-CM | POA: Diagnosis not present

## 2020-05-12 ENCOUNTER — Encounter: Payer: Self-pay | Admitting: Internal Medicine

## 2020-05-12 ENCOUNTER — Telehealth (INDEPENDENT_AMBULATORY_CARE_PROVIDER_SITE_OTHER): Payer: Medicare HMO | Admitting: Internal Medicine

## 2020-05-12 VITALS — BP 143/90 | HR 78 | Ht 69.0 in | Wt 174.0 lb

## 2020-05-12 DIAGNOSIS — E785 Hyperlipidemia, unspecified: Secondary | ICD-10-CM | POA: Diagnosis not present

## 2020-05-12 DIAGNOSIS — I10 Essential (primary) hypertension: Secondary | ICD-10-CM | POA: Diagnosis not present

## 2020-05-12 DIAGNOSIS — Z853 Personal history of malignant neoplasm of breast: Secondary | ICD-10-CM | POA: Diagnosis not present

## 2020-05-12 DIAGNOSIS — I428 Other cardiomyopathies: Secondary | ICD-10-CM

## 2020-05-12 NOTE — Progress Notes (Signed)
Virtual Visit via Telephone Note   This visit type was conducted due to national recommendations for restrictions regarding the COVID-19 Pandemic (e.g. social distancing) in an effort to limit this patient's exposure and mitigate transmission in our community.  Due to her co-morbid illnesses, this patient is at least at moderate risk for complications without adequate follow up.  This format is felt to be most appropriate for this patient at this time.  The patient did not have access to video technology/had technical difficulties with video requiring transitioning to audio format only (telephone).  All issues noted in this document were discussed and addressed.  No physical exam could be performed with this format.  Please refer to the patient's chart for her  consent to telehealth for [facility].   Date:  05/12/2020   ID:  Catherine Lowery, DOB 1946-03-18, MRN [id number] The patient was identified using 2 identifiers.  Evaluation Performed:  Follow-Up Visit  Patient Location:  [place] [address]  Provider location:   [address] [place], [place] [address]  PCP:  Alycia Rossetti, MD  Cardiologist:  Pixie Casino, MD Electrophysiologist:  None   Chief Complaint:  Telephone follow-up  History of Present Illness:    Catherine Lowery is a 75 y.o. female who presents via audio/video conferencing for a telehealth visit today.   She ruled-out for ACS and underwent a myoview, which was negative for ischemia, however, the EF was reduced to 47%. It should also be noted, that she was found to have a suppressed TSH at 0.045.  She has had no treatment for this. She reports no further chest pain today. She denies worsening shortness of breath. She has had worsening tremor. She gets hot flashes a lot and has been sweaty.   [name] returns today for follow-up. Overall she feels well. She denies any significant palpitations on propranolol. She continues to  take aspirin 325 mg daily. Blood pressure is well-controlled. She is seeing Dr. Cruzita Lederer for workup of a suppressed TSH however T3 and T4 have been fairly normal. Laboratory work for graves disease and other possible etiologies are still in the works. She's not currently on any medication.  03/29/2016  [name] was seen today in follow-up. Overall she feels well. She recently saw her primary care provider who noted her total cholesterol was elevated 209 with LDL of 125. EKG shows sinus bradycardia with nonspecific T-wave changes. Blood pressure is at goal today. She has follow-up with her endocrinologist in December for further monitoring of her hyperthyroidism.  03/30/2017  [name] returns today for follow-up.  This is a routine annual visit.  She denies any chest pain or worsening shortness of breath.  Lab work in July 2018 showed total cholesterol 148, triglycerides 156, HDL 30 and LDL 87, an improvement from an LDL of 121 about 2 years ago.  She is on atorvastatin 40 mg daily.  04/15/2019  [name] is seen today in follow-up.  Overall she is doing well.  She seems to be in remission from breast cancer.  This required 2 surgeries and radiation and she is now on Arimidex.  She has some hot flashes but generally feels pretty well.  There was some fatigue but it is improved with B12.  Blood pressures well controlled.  August 2020 or also well controlled with total cholesterol 121, HDL 31, triglycerides 87 and LDL 73.  She denies any chest pain or shortness of breath.  05/12/2020  [name] returns today for follow-up.  Overall she is doing well.  She denies any chest pain or worsening shortness of breath.  Cholesterol appears to be stable except for a notable increase in triglycerides to 206 however she came off of her over-the-counter fish oil.  LDL is very near target.  She has had no recurrence of her breast cancer.  She had thyroid ablation and is not on any replacement at this  time.  All of her COVID vaccines.  The patient does not have symptoms concerning for COVID-19 infection (fever, chills, cough, or new SHORTNESS OF BREATH).    Prior CV studies:   The following studies were reviewed today:  Chart reviewed  PMHx:  Past Medical History:  Diagnosis Date  . Cancer ([place]) 03/2018   right breast cancer  . Chronic kidney disease    CKD  . DJD (degenerative joint disease)    LEFT HIP  . Family history of breast cancer   . Family history of ovarian cancer   . Family history of prostate cancer   . Family history of prostate cancer   . H/O migraine 07/01/2019  . Hyperlipidemia    takes Simvasatin daily  . Hypertension    takes Propranolol and HCTZ daioly  . Hyperthyroidism   . Pneumonia 10/2015  . PONV (postoperative nausea and vomiting)    when ether used  . Shortness of breath dyspnea    with ambulation  . Tremors of nervous system    takes Primidone daily    Past Surgical History:  Procedure Laterality Date  . BOne Spur     left heel  . BREAST LUMPECTOMY WITH RADIOACTIVE SEED AND SENTINEL LYMPH NODE BIOPSY Right 05/07/2018   Procedure: RIGHT BREAST LUMPECTOMY WITH RADIOACTIVE SEED AND RIGHT  SENTINEL LYMPH NODE MAPPING;  Surgeon: [name], MD;  Location: [facility];  Service: General;  Laterality: Right;  . COLONOSCOPY    . JOINT REPLACEMENT Bilateral    knee  . KNEE SURGERY    . RE-EXCISION OF BREAST LUMPECTOMY Right 06/05/2018   Procedure: RE-EXCISION OF RIGHT BREAST LUMPECTOMY;  Surgeon: [name], MD;  Location: [facility];  Service: General;  Laterality: Right;  . ROTATOR CUFF REPAIR Bilateral   . 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 TOTAL HIP ARTHROPLASTY Left 12/29/2015   Procedure: TOTAL HIP ARTHROPLASTY ANTERIOR APPROACH;  Surgeon: Ninetta Lights, MD;  Location: [facility];  Service: Orthopedics;  Laterality: Left;    FAMHx:  Family History  Problem Relation Age of Onset  . Hypertension  Mother   . Heart disease Mother   . Stroke Mother   . Multiple myeloma Father 66  . Thyroid cancer Sister        dx 40's/50's  . Cancer Paternal Aunt        unknown  . Prostate cancer Paternal Uncle        dx >50- cancer was the cause of his death  . Ovarian cancer Paternal Aunt   . Throat cancer Paternal Uncle   . Breast cancer Cousin        dx >50  . Cancer Cousin        type unk  . Cancer Cousin        type unk  . Cancer Cousin        type unk    SOCHx:   reports that she has been smoking. She has a 10.00 pack-year smoking history. She has  never used smokeless tobacco. She reports that she does not drink alcohol and does not use drugs.  ALLERGIES:  Allergies  Allergen Reactions  . Morphine And Related Itching  . Other Itching and Nausea And Vomiting    "PAIN MEDICINES"  . Percocet [Oxycodone-Acetaminophen] Nausea And Vomiting    MEDS:  Current Meds  Medication Sig  . amLODipine (NORVASC) 10 MG tablet TAKE 1 TABLET BY MOUTH EVERY DAY  . anastrozole (ARIMIDEX) 1 MG tablet Take 1 tablet (1 mg total) by mouth daily.  Marland Kitchen aspirin (ASPIRIN ADULT) 325 MG tablet enteric coated; may use OTC  . atorvastatin (LIPITOR) 40 MG tablet TAKE 1 TABLET EVERY DAY  . CVS D3 50 MCG (2000 UT) CAPS TAKE 1 CAPSULE BY MOUTH EVERY DAY  . Omega-3 Fatty Acids (FISH OIL PO) Take 1,000 mg by mouth daily.  . primidone (MYSOLINE) 50 MG tablet TAKE 2 TABLETS EVERY MORNING  . propranolol (INDERAL) 40 MG tablet Take 0.5 tablets (20 mg total) by mouth 2 (two) times daily.  . VOLTAREN 1 % GEL APPLY TOPICALLY 4 TIMES DAILY AS NEEDED     ROS: Pertinent items noted in HPI and remainder of comprehensive ROS otherwise negative.  Labs/Other Tests and Data Reviewed:    Recent Labs: 11/21/2019: TSH 4.56 02/26/2020: ALT 18; BUN 17; Creatinine, Ser 1.09; Hemoglobin 12.1; Platelets 199; Potassium 4.1; Sodium 140   Recent Lipid Panel Lab Results  Component Value Date/Time   CHOL 149 03/22/2020 09:26 AM    TRIG 205 (H) 03/22/2020 09:26 AM   HDL 45 (L) 03/22/2020 09:26 AM   CHOLHDL 3.3 03/22/2020 09:26 AM   LDLCALC 74 03/22/2020 09:26 AM    Wt Readings from Last 3 Encounters:  05/12/20 174 lb (78.9 kg)  03/22/20 177 lb (80.3 kg)  02/26/20 174 lb 12.8 oz (79.3 kg)     Exam:    Vital Signs:  BP (!) 143/90   Pulse 78   Ht 5' 9" (1.753 m)   Wt 174 lb (78.9 kg)   LMP 01/24/2000   BMI 25.70 kg/m    Exam deferred due to telephone visit  ASSESSMENT & PLAN:    1. Atypical chest pain - negative myoview for ischemia (2016) 2. Normal LV function by echo, however EF 47% by Myoview (suspect gating abnormality) -12/2014, LVEF 55-60% by echo (01/2015) 3. Subclinical hyperthyroidism - recent radioactive iodine ablation, not on thyroid replacement 4. Dyslipidemia 5. Breast cancer, status post surgery/radiation/chemotherapy  Ms. Zuba is doing well without any recurrent chest pain.  She denies shortness of breath.  She had iodine ablation of her thyroid but is not on medication for it.  She had repeat lipids recently which showed an LDL of 74 but a small increase in triglycerides up to 206.  She was previously on fish oil which was discontinued however she was advised to restart it.  She has had fortunately no recurrence of her breast cancer and a recent negative mammogram.  No medication changes made today.  COVID-19 Education: The signs and symptoms of COVID-19 were discussed with the patient and how to seek care for testing (follow up with PCP or arrange E-visit).  The importance of social distancing was discussed today.  Patient Risk:   After full review of this patients clinical status, I feel that they are at least moderate risk at this time.  Time:   Today, I have spent 25 minutes with the patient with telehealth technology discussing dyslipidemia, hypothyroidism, breast cancer.  Medication Adjustments/Labs and Tests Ordered: Current medicines are reviewed at length with the  patient today.  Concerns regarding medicines are outlined above.   Tests Ordered: No orders of the defined types were placed in this encounter.   Medication Changes: No orders of the defined types were placed in this encounter.   Disposition:  in 1 year(s)  Pixie Casino, MD, [facility], [place] Director of the Advanced Lipid Disorders &  Cardiovascular Risk Reduction Clinic Diplomate of the American Board of Clinical Lipidology Attending Cardiologist  Direct Dial: [phone]  Fax: [phone]  Website:  [email or web address]  Pixie Casino, MD  05/12/2020 8:38 AM

## 2020-05-12 NOTE — Patient Instructions (Signed)
Medication Instructions:  No Changes In Medications at this time.  *If you need a refill on your cardiac medications before your next appointment, please call your pharmacy*  Follow-Up: At [facility], you and your health needs are our priority.  As part of our continuing mission to provide you with exceptional heart care, we have created designated Provider Care Teams.  These Care Teams include your primary Cardiologist (physician) and Advanced Practice Providers (APPs -  Physician Assistants and Nurse Practitioners) who all work together to provide you with the care you need, when you need it.  We recommend signing up for the patient portal called "MyChart".  Sign up information is provided on this After Visit Summary.  MyChart is used to connect with patients for Virtual Visits (Telemedicine).  Patients are able to view lab/test results, encounter notes, upcoming appointments, etc.  Non-urgent messages can be sent to your provider as well.   To learn more about what you can do with MyChart, go to [email or web address].    Your next appointment:   1 year    The format for your next appointment:   In Person  Provider:   K. Chad [name], MD  

## 2020-05-18 ENCOUNTER — Telehealth: Payer: Self-pay | Admitting: Pharmacist

## 2020-05-18 NOTE — Progress Notes (Addendum)
    Chronic Care Management Pharmacy Assistant   Name: Catherine Lowery  MRN: [id number] DOB: February 26, 1946  Reason for Encounter:General Disease State Call  Patient Questions:  1.  Have you seen any other providers since your last visit? Yes.   2.  Any changes in your medicines or health? Yes.   PCP : Alycia Rossetti, MD   Their chronic conditions include: HTN, HLD, Tobacco Abuse, Vitamin D deficiency.  Office Visits: 03/22/20 Dr. Buelah Manis COMPLETED Prednisone and Promethazine medications.  Consults: 05/12/20 Cardio Dr. Debara Pickett (Telemedicine) No medication changes. 02/26/20 Oncology Dr. Jana Hakim No medication changes.   Hospital:  03/15/20 for a cough STARTED Prednisone 40 mg and Promethazine-DM 6.25-15 mg/5 ML for 7 days. Allergies:   Allergies  Allergen Reactions   Morphine And Related Itching   Other Itching and Nausea And Vomiting    "PAIN MEDICINES"   Percocet [Oxycodone-Acetaminophen] Nausea And Vomiting    Medications: Outpatient Encounter Medications as of 05/18/2020  Medication Sig   amLODipine (NORVASC) 10 MG tablet TAKE 1 TABLET BY MOUTH EVERY DAY   anastrozole (ARIMIDEX) 1 MG tablet Take 1 tablet (1 mg total) by mouth daily.   aspirin (ASPIRIN ADULT) 325 MG tablet enteric coated; may use OTC   atorvastatin (LIPITOR) 40 MG tablet TAKE 1 TABLET EVERY DAY   [employer]S D3 50 MCG (2000 UT) CAPS TAKE 1 CAPSULE BY MOUTH EVERY DAY   Omega-3 Fatty Acids (FISH OIL PO) Take 1,000 mg by mouth daily.   primidone (MYSOLINE) 50 MG tablet TAKE 2 TABLETS EVERY MORNING   propranolol (INDERAL) 40 MG tablet Take 0.5 tablets (20 mg total) by mouth 2 (two) times daily.   VOLTAREN 1 % GEL APPLY TOPICALLY 4 TIMES DAILY AS NEEDED   No facility-administered encounter medications on file as of 05/18/2020.    Current Diagnosis: Patient Active Problem List   Diagnosis Date Noted   H/O migraine 07/01/2019   Bradycardia 12/17/2018   Peripheral edema 12/17/2018   Genetic testing 05/20/2018    Family history of breast cancer    Family history of prostate cancer    Family history of ovarian cancer    Malignant neoplasm of lower-inner quadrant of right breast of female, estrogen receptor positive ([place]) 04/11/2018   DNR (do not resuscitate) 05/22/2017   Benign familial tremor 05/16/2016   Vitamin D deficiency 03/01/2016   Primary localized osteoarthritis of left hip 12/29/2015   Encounter for screening colonoscopy 02/18/2015   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 She stated she uses chewing gum to help her stop the urge of wanting to smoke . Patient stated her appetite is good, she stated she's eating too much sometimes. She stated all of her medications come from [place] for 90 days.She informed me that she got her mammogram done around Oct/Nov and received good results.    Follow-Up:  Pharmacist Review   Charlann Lange, RMA Clinical Pharmacist Assistant [phone]  4 minutes spent in review, coordination, and documentation.  Reviewed by: Beverly Milch, PharmD Clinical Pharmacist [facility] [phone]

## 2020-05-20 ENCOUNTER — Telehealth: Payer: Self-pay | Admitting: Pharmacist

## 2020-05-20 NOTE — Progress Notes (Signed)
    Chronic Care Management Pharmacy Assistant   Name: DELILA KUKLINSKI  MRN: [id number] DOB: 1946/03/31  Reason for Encounter: Adherence Review  Patient Questions:  1.  Have you seen any other providers since your last visit? Yes.   2.  Any changes in your medicines or health? Yes.    PCP : Alycia Rossetti, MD  Verified Adherence Gap Information. Per insurance data, the patient is 100% compliant with the following medication atorvastatin 40 mg.The patient met their annual wellness and wellness bundle screening. Their most recent A1C was 5.5 on 05/16/2016.   Follow-Up:  Pharmacist Review

## 2020-05-24 ENCOUNTER — Other Ambulatory Visit: Payer: Self-pay | Admitting: Family Medicine

## 2020-05-27 ENCOUNTER — Ambulatory Visit: Payer: Medicare HMO | Admitting: Internal Medicine

## 2020-05-27 ENCOUNTER — Encounter: Payer: Self-pay | Admitting: Internal Medicine

## 2020-05-27 ENCOUNTER — Other Ambulatory Visit: Payer: Self-pay

## 2020-05-27 VITALS — BP 120/70 | HR 65 | Ht 69.0 in | Wt 175.2 lb

## 2020-05-27 DIAGNOSIS — E038 Other specified hypothyroidism: Secondary | ICD-10-CM

## 2020-05-27 DIAGNOSIS — E042 Nontoxic multinodular goiter: Secondary | ICD-10-CM

## 2020-05-27 LAB — T4, FREE: Free T4: 0.78 ng/dL (ref 0.60–1.60)

## 2020-05-27 LAB — TSH: TSH: 4.99 u[IU]/mL — ABNORMAL HIGH (ref 0.35–4.50)

## 2020-05-27 LAB — T3, FREE: T3, Free: 3.4 pg/mL (ref 2.3–4.2)

## 2020-05-27 NOTE — Progress Notes (Signed)
Patient ID: Catherine Lowery, female   DOB: 08-Aug-1945, 75 y.o.   MRN: [id number]  This visit occurred during the SARS-CoV-2 public health emergency.  Safety protocols were in place, including screening questions prior to the visit, additional usage of staff PPE, and extensive cleaning of exam room while observing appropriate contact time as indicated for disinfecting solutions.   HPI  Catherine Lowery is a 75 y.o.-year-old female, initially referred by her cardiologist, Dr. Debara Pickett, returning for f/u for history of toxic MNG, with subclinical thyrotoxicosis, now s/p RAI tx. Last visit 6 months ago.  Reviewed and addended history: Pt was found to have a low TSH at the time of a recent hospitalization for CP in 12/2014. She r/o for an AMI. She had a follow up visit with Dr Debara Pickett >> a TSH was repeated >> better, but still low.  Thyroid Uptake and scan (11/30/2015) report reviewed:  The 24 hour radioactive iodine uptake is equal to 21.7%.  Heterogeneous tracer activity is identified within both lobes of the thyroid gland. Relative areas of decreased uptake localizing to the upper pole of the left lobe, mid right lobe and upper pole of right lobe is noted.  IMPRESSION: 1. 24 hour radioactive iodine uptake is within normal limits at 21.7%. 2. Multi nodular thyroid gland.      We next obtained a thyroid U/S (12/09/2015) to investigate the upper "cold" thyroid nodules - however, these were small, while the dominant nodules appeared "hot" >> we did not have to biopsy them: Right thyroid lobe: 5.9 x 2.0 x 2.4 cm. Multiple nodules are scattered throughout the right lobe.  1.3 x 0.7 x 0.9 cm mid lobe solid nodule. 1.7 x 1.3 x 0.8 cm upper pole solid nodule.  1.9 x 0.9 x 1.2 cm lower pole nodule.  Other scattered smaller nodules are noted. Left thyroid lobe: 5.6 x 1.5 x 2.0 cm. Multiple solid nodules are scattered throughout the left lobe.  1.0 x 1.1 x 1.4 cm mid lobe nodule.  2.1 x 0.9 x 1.2  cm lower pole nodule.  1.4 x 1.0 x 1.1 cm upper pole nodule.  Other smaller nodules are noted. Isthmus Thickness: 0.4 cm.  Small subcentimeter nodules are present. Lymphadenopathy: None visualized.  Pt had RAI tx (02/24/2016): 29.4 mCi   Thyroid U/S (11/26/2019): Parenchymal Echotexture: Moderately heterogenous Isthmus: 0.2 cm Right lobe: 4.6 cm x 1.2 cm x 1.7 cm Left lobe: 3.4 cm x 1.1 cm x 1.7 cm _________________________________________________________  Nodule labeled 1 right mid thyroid, decreased in size to 6 mm, indicative of benign behavior. TR 4 and does not meet criteria for surveillance or biopsy.  Nodule labeled 2 in the right mid thyroid, decreased to 7 mm indicative of benign behavior. This remains TR 4 with central macrocalcification. Nodule does not meet criteria for surveillance or biopsy.  Nodule labeled 3 in the right mid thyroid, decreased to 6 mm indicative of benign behavior. Spongiform appearance and does not meet criteria for surveillance or biopsy.  Nodule labeled 4 in the left mid thyroid, 1.1 cm, decreased from 1.4 cm, indicating benign behavior. TR 4 and meets criteria for surveillance.  Nodule labeled 5 inferior left thyroid, decreased to 7 mm indicative of benign behavior. This nodule is TR 2 and does not meet criteria for further surveillance or biopsy.  Nodule # 6: Location: Left; Inferior Maximum size: 0.7 cm; Other 2 dimensions: 0.5 cm x 0.5 cm Composition: cannot determine (2) Echogenicity: hypoechoic (2) Nodule does not meet criteria  for surveillance or biopsy _________________________________________________________  No adenopathy  IMPRESSION: Multinodular thyroid.  Left mid thyroid nodule (labeled 4, TR 4, 1.1 cm) meets criteria for surveillance, as designated by the newly established ACR TI-RADS criteria. Surveillance ultrasound study recommended to be performed annually up to 5 years. Given the decreasing size over  time, this is favored to represent a benign nodule, and 5 years of surveillance would span 12/09/2015-August of 2022.  TSH was elevated twice after her RAI treatment, with normal free thyroid hormones.  Reviewed her TFTs: Lab Results  Component Value Date   TSH 4.56 (H) 11/21/2019   TSH 5.67 (H) 09/17/2019   TSH 2.87 09/11/2018   TSH 2.27 09/10/2017   TSH 2.43 03/13/2017   TSH 1.89 11/10/2016   TSH 1.80 07/04/2016   TSH 0.07 (L) 04/05/2016   TSH 0.04 (L) 11/15/2015   TSH 0.33 (L) 06/30/2015   FREET4 0.79 11/21/2019   FREET4 0.9 09/17/2019   FREET4 0.93 09/11/2018   FREET4 0.76 09/10/2017   FREET4 0.78 03/13/2017   FREET4 0.67 11/10/2016   FREET4 0.76 07/04/2016   FREET4 0.93 04/05/2016   FREET4 0.93 11/15/2015   FREET4 0.85 06/30/2015    Her Graves' antibodies were not elevated: Lab Results  Component Value Date   TSI 51 03/02/2015   She has chronic hereditary tremor.  She sees neurology (Dr. Amalia Hailey in [place]).  She is on propranolol intramedial.  Pt denies: - feeling nodules in neck - dysphagia - choking - SOB with lying down But she does have hoarseness from  smoking.  She has a family history of thyroid cancer in sister.  No FH of thyroid cancer. No h/o radiation tx to head or neck other than RAI treatment.  No seaweed or kelp. No recent contrast studies. No herbal supplements. No Biotin use. No recent steroids use.   She was diagnosed with breast cancer in 03/2018. She had partial mastectomy, then more extensive R breast sx. she had RxTx up to 07/2018.  She is on Arimidex-has hot flashes.  She takes a B12 vitamin.  ROS: Constitutional: no weight gain/no weight loss, no fatigue, no subjective hyperthermia, no subjective hypothermia Eyes: no blurry vision, no xerophthalmia ENT: no sore throat, + see HPI Cardiovascular: no CP/no SOB/no palpitations/no leg swelling Respiratory: no cough/no SOB/no wheezing Gastrointestinal: no N/no V/no D/no C/no acid  reflux Musculoskeletal: no muscle aches/no joint aches Skin: no rashes, no hair loss Neurological: + tremors/no numbness/no tingling/no dizziness  I reviewed pt's medications, allergies, PMH, social hx, family hx, and changes were documented in the history of present illness. Otherwise, unchanged from my initial visit note.  Past Medical History:  Diagnosis Date  . Cancer ([place]) 03/2018   right breast cancer  . Chronic kidney disease    CKD  . DJD (degenerative joint disease)    LEFT HIP  . Family history of breast cancer   . Family history of ovarian cancer   . Family history of prostate cancer   . Family history of prostate cancer   . H/O migraine 07/01/2019  . Hyperlipidemia    takes Simvasatin daily  . Hypertension    takes Propranolol and HCTZ daioly  . Hyperthyroidism   . Pneumonia 10/2015  . PONV (postoperative nausea and vomiting)    when ether used  . Shortness of breath dyspnea    with ambulation  . Tremors of nervous system    takes Primidone daily   Past Surgical History:  Procedure Laterality Date  .  BOne Spur     left heel  . BREAST LUMPECTOMY WITH RADIOACTIVE SEED AND SENTINEL LYMPH NODE BIOPSY Right 05/07/2018   Procedure: RIGHT BREAST LUMPECTOMY WITH RADIOACTIVE SEED AND RIGHT  SENTINEL LYMPH NODE MAPPING;  Surgeon: [name], MD;  Location: [facility];  Service: General;  Laterality: Right;  . COLONOSCOPY    . JOINT REPLACEMENT Bilateral    knee  . KNEE SURGERY    . RE-EXCISION OF BREAST LUMPECTOMY Right 06/05/2018   Procedure: RE-EXCISION OF RIGHT BREAST LUMPECTOMY;  Surgeon: [name], MD;  Location: [facility];  Service: General;  Laterality: Right;  . ROTATOR CUFF REPAIR Bilateral   . 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 TOTAL HIP ARTHROPLASTY Left 12/29/2015   Procedure: TOTAL HIP ARTHROPLASTY ANTERIOR APPROACH;  Surgeon: Ninetta Lights, MD;  Location: [facility];  Service: Orthopedics;  Laterality: Left;    Social History   Social History  . Marital Status: Married    Spouse Name: N/A  . Number of Children: 1   Occupational History   retired   Social History Main Topics  . Smoking status: Current Every Day Smoker -- 0.50 packs/day for 20 years  . Smokeless tobacco: Not on file  . Alcohol Use: No  . Drug Use: No   Current Outpatient Medications on File Prior to Visit  Medication Sig Dispense Refill  . amLODipine (NORVASC) 10 MG tablet TAKE 1 TABLET BY MOUTH EVERY DAY 90 tablet 3  . anastrozole (ARIMIDEX) 1 MG tablet Take 1 tablet (1 mg total) by mouth daily. 90 tablet 4  . aspirin (ASPIRIN ADULT) 325 MG tablet enteric coated; may use OTC 30 tablet 0  . atorvastatin (LIPITOR) 40 MG tablet TAKE 1 TABLET EVERY DAY 90 tablet 2  . CVS D3 50 MCG (2000 UT) CAPS TAKE 1 CAPSULE BY MOUTH EVERY DAY 100 capsule 11  . Omega-3 Fatty Acids (FISH OIL PO) Take 1,000 mg by mouth daily.    . primidone (MYSOLINE) 50 MG tablet TAKE 2 TABLETS EVERY MORNING 180 tablet 2  . propranolol (INDERAL) 40 MG tablet TAKE 1/2 TABLET TWICE DAILY 90 tablet 1  . VOLTAREN 1 % GEL APPLY TOPICALLY 4 TIMES DAILY AS NEEDED 100 g 0   No current facility-administered medications on file prior to visit.   Allergies  Allergen Reactions  . Morphine And Related Itching  . Other Itching and Nausea And Vomiting    "PAIN MEDICINES"  . Percocet [Oxycodone-Acetaminophen] Nausea And Vomiting   Family History  Problem Relation Age of Onset  . Hypertension Mother   . Heart disease Mother   . Stroke Mother   . Multiple myeloma Father 26  . Thyroid cancer Sister        dx 40's/50's  . Cancer Paternal Aunt        unknown  . Prostate cancer Paternal Uncle        dx >50- cancer was the cause of his death  . Ovarian cancer Paternal Aunt   . Throat cancer Paternal Uncle   . Breast cancer Cousin        dx >50  . Cancer Cousin        type unk  . Cancer Cousin        type unk  . Cancer Cousin        type unk  + see  HPI  PE: BP 120/70   Pulse 65   Ht $R'5\' 9"'HY$  (1.753 m)   Wt  175 lb 3.2 oz (79.5 kg)   LMP 01/24/2000   SpO2 96%   BMI 25.87 kg/m  Body mass index is 25.87 kg/m. Wt Readings from Last 3 Encounters:  05/27/20 175 lb 3.2 oz (79.5 kg)  05/12/20 174 lb (78.9 kg)  03/22/20 177 lb (80.3 kg)   Constitutional: Slightly overweight, in NAD Eyes: PERRLA, EOMI, no exophthalmos ENT: moist mucous membranes, no thyromegaly, no cervical lymphadenopathy Cardiovascular: RRR, No MRG, + mild periankle edema B Respiratory: CTA B Gastrointestinal: abdomen soft, NT, ND, BS+ Musculoskeletal: no deformities, strength intact in all 4 Skin: moist, warm, no rashes Neurological: + tremor with outstretched hands, DTR not elicited in knees due to bilateral TKRs  ASSESSMENT: 1.  Multiple thyroid nodules - H/o Toxic MNG  2. Subclinical hypothyroidism  PLAN:  1. 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  She did not have symptoms of her thyrotoxicosis except for tremors, which are chronic for her.  She is on beta-blockers for these.  She is now status post RAI treatment in 01/2016, with subsequent normalization of her TFTs, however, more recently, her TSH has been slightly above target, with normal free thyroid hormones: 08/2019 and a TSH was slightly elevated at 5.67 while her free thyroid hormones were normal. At last visit, the TSH returned slightly high, although improved from before, at 4.56, with normal free thyroid hormones.  We discussed that no intervention was needed at that time but will need to repeat her TFTs today. -At today's visit, she does not report any hypothyroid symptoms -We again discussed about how to take levothyroxine correctly, in case we need to start: every day, with water, at least 30 minutes before breakfast, separated by at least 4 hours from: - acid reflux medications - calcium - iron - multivitamins -She  prefers not to start any medication unless absolutely needed -I will see her back in 1 year but with labs in 6 months or sooner, depending on the results today  2.  Multiple thyroid nodules -No neck compression symptoms -On palpation, thyroid size appears to be smaller than at last visit, as expected after RAI treatment -After last visit, we checked another ultrasound of the thyroid.  The left mid nodule, measuring 1.1 cm required follow-up for another year, but the rest of the nodules were not worrisome and no follow-up was needed for these. -We will continue to follow her and plan to get another ultrasound at next visit.  Component     Latest Ref Rng & Units 05/27/2020  Triiodothyronine,Free,Serum     2.3 - 4.2 pg/mL 3.4  T4,Free(Direct)     0.60 - 1.60 ng/dL 0.78  TSH     0.35 - 4.50 uIU/mL 4.99 (H)  TSH only slightly high, while free T4 and free T3 are normal.  For now, ok to continue to just monitor her without intervention.  We will repeat her tests in 6 months.  [name], MD PhD [facility]

## 2020-05-27 NOTE — Patient Instructions (Addendum)
Please stop at the lab.  If we need to start Levothyroxine, take the thyroid hormone every day, with water, at least 30 minutes before breakfast, separated by at least 4 hours from: - acid reflux medications - calcium - iron - multivitamins  Please return in 1 year but in 6 months for labs.

## 2020-05-28 ENCOUNTER — Telehealth: Payer: Self-pay

## 2020-05-28 ENCOUNTER — Telehealth: Payer: Self-pay | Admitting: Pharmacist

## 2020-05-28 NOTE — Telephone Encounter (Signed)
-----   Message from [name], MD sent at 05/27/2020  4:59 PM EST ----- Can you please call pt.:  TSH only slightly high, while free T4 and free T3 are normal.  For now, ok to continue to just monitor her without starting thyroid hormones.  We need to repeat her tests in 6 months >> Let's have her back around the middle of July.  Labs are in.

## 2020-05-28 NOTE — Telephone Encounter (Signed)
Spoke with pt regarding her lab results and she understood w/o any questions. She stated that she already has an appt for labs in July and F/U with provider as well.  Catherine Lowery

## 2020-05-28 NOTE — Progress Notes (Addendum)
Verified Adherence Gap Information. Per insurance data, the patient is currently compliant with their Atorvastatin 40 mg tab. Last fill date 01-26-2020. Their most recent A1c 5.5%  Fanny Skates, [place] Pharmacist Assistant [phone] 3 minutes spent in review, coordination, and documentation.  Reviewed by: Beverly Milch, PharmD Clinical Pharmacist [facility] [phone]

## 2020-06-18 DIAGNOSIS — M1612 Unilateral primary osteoarthritis, left hip: Secondary | ICD-10-CM | POA: Diagnosis not present

## 2020-06-18 DIAGNOSIS — M545 Low back pain, unspecified: Secondary | ICD-10-CM | POA: Diagnosis not present

## 2020-08-05 ENCOUNTER — Telehealth: Payer: Self-pay | Admitting: Family Medicine

## 2020-08-05 NOTE — Telephone Encounter (Signed)
Copied from [place] [phone]. Topic: Medicare AWV >> Aug 05, 2020  1:34 PM Cher Nakai R wrote: Reason for CRM:   No answer unable to leave a message for patient to call back and schedule Medicare Annual Wellness Visit (AWV) in office.   If not able to come in office, please offer to do virtually or by telephone.   Last AWV: 05/19/2019  Please schedule at anytime with [facility]-Nurse Health Advisor.  If any questions, please contact me at [phone]

## 2020-09-14 ENCOUNTER — Telehealth: Payer: Self-pay | Admitting: Pharmacist

## 2020-09-14 NOTE — Progress Notes (Addendum)
    Chronic Care Management Pharmacy Assistant   Name: Catherine Lowery  MRN: [id number] DOB: 1945/11/28  Reason for Encounter: General Disease State Call   Conditions to be addressed/monitored: HTN, HLD, Vitamin D deficiency, Tobacco Abuse  Recent office visits:  None since 05/28/20  Recent consult visits:  06/18/20 Orthopedic Surgery Renette Butters. No information given.   Hospital visits:  None since 05/28/20  Medications: Outpatient Encounter Medications as of 09/14/2020  Medication Sig   amLODipine (NORVASC) 10 MG tablet TAKE 1 TABLET BY MOUTH EVERY DAY   anastrozole (ARIMIDEX) 1 MG tablet Take 1 tablet (1 mg total) by mouth daily.   aspirin (ASPIRIN ADULT) 325 MG tablet enteric coated; may use OTC   atorvastatin (LIPITOR) 40 MG tablet TAKE 1 TABLET EVERY DAY   CVS D3 50 MCG (2000 UT) CAPS TAKE 1 CAPSULE BY MOUTH EVERY DAY   Omega-3 Fatty Acids (FISH OIL PO) Take 1,000 mg by mouth daily.   primidone (MYSOLINE) 50 MG tablet TAKE 2 TABLETS EVERY MORNING   propranolol (INDERAL) 40 MG tablet TAKE 1/2 TABLET TWICE DAILY   VOLTAREN 1 % GEL APPLY TOPICALLY 4 TIMES DAILY AS NEEDED   No facility-administered encounter medications on file as of 09/14/2020.   GEN CALL: Patient stated she is doing well. She has not had to go to any walk in clinics or the ER department. She stated she is still actively trying to find her a new PCP. She stated she had a list and just needed to call them about making a appointment. She stated her medications are all in good standing she stated she does not need any refills. She stated she does not have any concerns or problems with her medications at this time.   Star Rating Drugs: Atorvastatin 40 mg 90 DS 07/07/20  Follow-Up:Pharmacist Review   Charlann Lange, RMA Clinical Pharmacist Assistant [phone]  10 minutes spent in review, coordination, and documentation.  Reviewed by: Beverly Milch, PharmD Clinical Pharmacist [facility] [phone]

## 2020-09-24 ENCOUNTER — Encounter: Payer: Self-pay | Admitting: Oncology

## 2020-11-09 ENCOUNTER — Telehealth: Payer: Self-pay

## 2020-11-09 ENCOUNTER — Other Ambulatory Visit (INDEPENDENT_AMBULATORY_CARE_PROVIDER_SITE_OTHER): Payer: Medicare HMO

## 2020-11-09 ENCOUNTER — Other Ambulatory Visit: Payer: Self-pay

## 2020-11-09 DIAGNOSIS — E038 Other specified hypothyroidism: Secondary | ICD-10-CM

## 2020-11-09 LAB — T3, FREE: T3, Free: 3.8 pg/mL (ref 2.3–4.2)

## 2020-11-09 LAB — T4, FREE: Free T4: 0.76 ng/dL (ref 0.60–1.60)

## 2020-11-09 LAB — TSH: TSH: 7.39 u[IU]/mL — ABNORMAL HIGH (ref 0.35–5.50)

## 2020-11-09 MED ORDER — LEVOTHYROXINE SODIUM 50 MCG PO TABS: 50.0000 ug | ORAL_TABLET | Freq: Every day | ORAL | 1 refills | Status: DC

## 2020-11-09 NOTE — Telephone Encounter (Signed)
Labs placed. Rx sent to preferred pharmacy.

## 2020-11-09 NOTE — Telephone Encounter (Addendum)
-----   Message from [name], MD sent at 11/09/2020 12:54 PM EDT ----- Can you please call pt.:  Her TSH is increased now.  We will need to start levothyroxine.  Please send a prescription for 50 mcg of levothyroxine (45 tablets with 3 refills) to be taken as follows: Every day, with water, at least 30 minutes before breakfast, separated by at least 4 hours from: - acid reflux medications - calcium - iron - multivitamins Also, can you please order a TSH and a free T4 to be checked in 1.5 months after starting the levothyroxine?

## 2020-11-18 ENCOUNTER — Other Ambulatory Visit: Payer: Medicare HMO

## 2020-11-22 DIAGNOSIS — N1831 Chronic kidney disease, stage 3a: Secondary | ICD-10-CM | POA: Diagnosis not present

## 2020-11-22 LAB — BASIC METABOLIC PANEL
Chloride: 104 (ref 99–108)
Glucose: 89
Potassium: 4.5 (ref 3.4–5.3)
Sodium: 139 (ref 137–147)

## 2020-11-22 LAB — COMPREHENSIVE METABOLIC PANEL
Albumin: 4.3 (ref 3.5–5.0)
Calcium: 9.4 (ref 8.7–10.7)

## 2020-11-22 LAB — VITAMIN D 25 HYDROXY (VIT D DEFICIENCY, FRACTURES): Vit D, 25-Hydroxy: 62.9

## 2020-12-01 ENCOUNTER — Telehealth: Payer: Self-pay | Admitting: Pharmacist

## 2020-12-01 DIAGNOSIS — E559 Vitamin D deficiency, unspecified: Secondary | ICD-10-CM | POA: Diagnosis not present

## 2020-12-01 DIAGNOSIS — N182 Chronic kidney disease, stage 2 (mild): Secondary | ICD-10-CM | POA: Diagnosis not present

## 2020-12-01 DIAGNOSIS — C50919 Malignant neoplasm of unspecified site of unspecified female breast: Secondary | ICD-10-CM | POA: Diagnosis not present

## 2020-12-01 DIAGNOSIS — I129 Hypertensive chronic kidney disease with stage 1 through stage 4 chronic kidney disease, or unspecified chronic kidney disease: Secondary | ICD-10-CM | POA: Diagnosis not present

## 2020-12-01 NOTE — Progress Notes (Addendum)
Chronic Care Management Pharmacy Assistant   Name: Catherine Lowery  MRN: [id number] DOB: 1946/04/21  Reason for Encounter: Disease State For HTN.    Conditions to be addressed/monitored: HTN, HLD, Vitamin D deficiency, Tobacco Abuse  Recent office visits:  12/02/20 [name], NP. For follow-up. CHANGED/DECREASED Cholecalciferol to 1000 units daily.   Recent consult visits:  11/09/20 (Telephone) [facility] Endocrinology STARTED Levothyroxine 50 mg daily.   Hospital visits:  None since 09/14/20  Medications: Outpatient Encounter Medications as of 12/01/2020  Medication Sig   amLODipine (NORVASC) 10 MG tablet TAKE 1 TABLET BY MOUTH EVERY DAY   anastrozole (ARIMIDEX) 1 MG tablet Take 1 tablet (1 mg total) by mouth daily.   aspirin (ASPIRIN ADULT) 325 MG tablet enteric coated; may use OTC   atorvastatin (LIPITOR) 40 MG tablet TAKE 1 TABLET EVERY DAY   [employer] D3 50 MCG (2000 UT) CAPS TAKE 1 CAPSULE BY MOUTH EVERY DAY   levothyroxine (SYNTHROID) 50 MCG tablet Take 1 tablet (50 mcg total) by mouth daily.   Omega-3 Fatty Acids (FISH OIL PO) Take 1,000 mg by mouth daily.   primidone (MYSOLINE) 50 MG tablet TAKE 2 TABLETS EVERY MORNING   propranolol (INDERAL) 40 MG tablet TAKE 1/2 TABLET TWICE DAILY   VOLTAREN 1 % GEL APPLY TOPICALLY 4 TIMES DAILY AS NEEDED   No facility-administered encounter medications on file as of 12/01/2020.    Reviewed chart prior to disease state call. Spoke with patient regarding BP  Recent Office Vitals: BP Readings from Last 3 Encounters:  05/27/20 120/70  05/12/20 (!) 143/90  03/22/20 132/64   Pulse Readings from Last 3 Encounters:  05/27/20 65  05/12/20 78  03/22/20 76    Wt Readings from Last 3 Encounters:  05/27/20 175 lb 3.2 oz (79.5 kg)  05/12/20 174 lb (78.9 kg)  03/22/20 177 lb (80.3 kg)     Kidney Function Lab Results  Component Value Date/Time   CREATININE 1.09 (H) 02/26/2020 12:43 PM   CREATININE 1.01 (H) 06/23/2019 03:24 PM    CREATININE 1.05 (H) 05/19/2019 10:34 AM   CREATININE 1.13 (H) 12/17/2018 12:51 PM   GFRNONAA 53 (L) 02/26/2020 12:43 PM   GFRNONAA 48 (L) 01/23/2018 08:04 AM   GFRAA >60 06/23/2019 03:24 PM   GFRAA 56 (L) 01/23/2018 08:04 AM    BMP Latest Ref Rng & Units 02/26/2020 06/23/2019 05/19/2019  Glucose 70 - 99 mg/dL 105(H) 88 92  BUN 8 - 23 mg/dL '17 14 16  '$ Creatinine 0.44 - 1.00 mg/dL 1.09(H) 1.01(H) 1.05(H)  BUN/Creat Ratio 6 - 22 (calc) - - 15  Sodium 135 - 145 mmol/L 140 141 140  Potassium 3.5 - 5.1 mmol/L 4.1 4.0 4.3  Chloride 98 - 111 mmol/L 106 107 107  CO2 22 - 32 mmol/L '25 25 25  '$ Calcium 8.9 - 10.3 mg/dL 9.6 8.9 9.1    Current antihypertensive regimen:  Propranolol 40 mg 0.5 tablets by mouth 2 times daily. Amlodipine 10 mg 1 tablet by mouth daily.  How often are you checking your Blood Pressure? Patient stated she doesn't check her blood pressure at home.  Current home BP readings: Patient stated she doesn't check her blood pressure at home.  What recent interventions/DTPs have been made by any provider to improve Blood Pressure control since last CPP Visit: None.  Any recent hospitalizations or ED visits since last visit with CPP? Patient stated no.  What diet changes have been made to improve Blood Pressure Control?  Patient  stated she has a good appetite. She is still trying to stop smoking so ut effects her eating habits.   What exercise is being done to improve your Blood Pressure Control?  Patient stated she is active for the most part but does not do any regular exercising.   Adherence Review: Is the patient currently on ACE/ARB medication? N/A.  Does the patient have >5 day gap between last estimated fill dates? N/A.  Star Rating Drugs: Atorvastatin 40 mg 90 DS 07/07/20.  Follow-Up:Pharmacist Review  Charlann Lange, RMA Clinical Pharmacist Assistant [phone]  10 minutes spent in review, coordination, and documentation.  Reviewed by: Beverly Milch,  PharmD Clinical Pharmacist [phone]

## 2020-12-01 NOTE — Progress Notes (Signed)
Subjective:    Patient ID: Catherine Lowery, female    DOB: 22-Jan-1946, 75 y.o.   MRN: [id number]  HPI: Catherine Lowery is a 75 y.o. female presenting for follow up.  Chief Complaint  Patient presents with   Follow-up    Has labs from kidney doctor and medication adjustment request   skin issue    Both elbows   TSH - follows with Endocrinology.  Has not started taking medication yet because of cost.    Saw nephrologist yesterday and they decreased vitamin D to 1000 IU daily. 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    SKIN LESION Duration: acute Location: right and left elbows, mid back Painful: no Itching: yes Onset: sudden Context: not changing Associated signs and symptoms: none History of skin cancer: no History of precancerous skin lesions: no Family history of skin cancer: no   HYPERTENSION Currently taking amlodipine 10 mg daily for blood pressure.  Also taking atorvastatin 40 mg daily for cholesterol. Hypertension status: controlled  Satisfied with current treatment? yes Duration of hypertension: chronic BP monitoring frequency:  not checking BP medication side effects:  no Aspirin: yes Recurrent headaches: no Visual changes: no Palpitations: no Dyspnea: no Chest pain: no Lower extremity edema: yes; when working  Dizzy/lightheaded: no  TOBACCO USER Smoking Status: current smoker Smoking Amount: 3 cigarettes per day Smoking Onset: years ago ~ 18 Smoking Quit Date: TBD Smoking triggers: son/stress Type of tobacco use: cigarettes Children in the house: no Treatments attempted: gum, distraction from stress Pneumovax: UTD  Allergies  Allergen Reactions   Morphine And Related Itching   Other Itching and Nausea And Vomiting    "PAIN MEDICINES"   Percocet [Oxycodone-Acetaminophen] Nausea And Vomiting    Outpatient Encounter Medications as of 12/02/2020  Medication Sig    anastrozole (ARIMIDEX) 1 MG tablet Take 1 tablet (1 mg total) by mouth daily.   aspirin (ASPIRIN ADULT) 325 MG tablet enteric coated; may use OTC   Cholecalciferol 25 MCG (1000 UT) capsule Take 1,000 Units by mouth daily.   levothyroxine (SYNTHROID) 50 MCG tablet Take 1 tablet (50 mcg total) by mouth daily.   Omega-3 Fatty Acids (FISH OIL PO) Take 1,000 mg by mouth daily.   VOLTAREN 1 % GEL APPLY TOPICALLY 4 TIMES DAILY AS NEEDED   [DISCONTINUED] amLODipine (NORVASC) 10 MG tablet TAKE 1 TABLET BY MOUTH EVERY DAY   [DISCONTINUED] atorvastatin (LIPITOR) 40 MG tablet TAKE 1 TABLET EVERY DAY   [DISCONTINUED] CVS D3 50 MCG (2000 UT) CAPS TAKE 1 CAPSULE BY MOUTH EVERY DAY   [DISCONTINUED] primidone (MYSOLINE) 50 MG tablet TAKE 2 TABLETS EVERY MORNING   [DISCONTINUED] propranolol (INDERAL) 40 MG tablet TAKE 1/2 TABLET TWICE DAILY   amLODipine (NORVASC) 10 MG tablet Take 1 tablet (10 mg total) by mouth daily.   atorvastatin (LIPITOR) 40 MG tablet Take 1 tablet (40 mg total) by mouth daily.   primidone (MYSOLINE) 50 MG tablet Take 2 tablets (100 mg total) by mouth every morning.   propranolol (INDERAL) 40 MG tablet Take 0.5 tablets (20 mg total) by mouth 2 (two) times daily.   No facility-administered encounter medications on file as of 12/02/2020.    Patient Active Problem List   Diagnosis Date Noted   H/O migraine 07/01/2019   Bradycardia 12/17/2018   Peripheral edema 12/17/2018   Genetic testing 05/20/2018   Family history of breast cancer  Family history of prostate cancer    Family history of ovarian cancer    Malignant neoplasm of lower-inner quadrant of right breast of female, estrogen receptor positive ([place]) 04/11/2018   DNR (do not resuscitate) 05/22/2017   Benign familial tremor 05/16/2016   Vitamin D deficiency 03/01/2016   Primary localized osteoarthritis of left hip 12/29/2015   Encounter for screening colonoscopy 02/18/2015   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 02/04/2015    Hyperlipidemia 01/12/2015   Benign essential HTN 01/12/2015   Tobacco abuse 01/12/2015    Past Medical History:  Diagnosis Date   Cancer ([place]) 03/2018   right breast cancer   Chronic kidney disease    CKD   DJD (degenerative joint disease)    LEFT HIP   Family history of breast cancer    Family history of ovarian cancer    Family history of prostate cancer    Family history of prostate cancer    H/O migraine 07/01/2019   Hyperlipidemia    takes Simvasatin daily   Hypertension    takes Propranolol and HCTZ daioly   Hyperthyroidism    Pneumonia 10/2015   PONV (postoperative nausea and vomiting)    when ether used   Shortness of breath dyspnea    with ambulation   Tremors of nervous system    takes Primidone daily    Relevant past medical, surgical, family and social history reviewed and updated as indicated. Interim medical history since our last visit reviewed.  Review of Systems Per HPI unless specifically indicated above     Objective:    BP 118/64   Pulse (!) 59   Temp 98.3 F (36.8 C)   Ht '5\' 9"'$  (1.753 m)   Wt 165 lb 3.2 oz (74.9 kg)   LMP 01/24/2000   SpO2 100%   BMI 24.40 kg/m   Wt Readings from Last 3 Encounters:  12/02/20 165 lb 3.2 oz (74.9 kg)  05/27/20 175 lb 3.2 oz (79.5 kg)  05/12/20 174 lb (78.9 kg)    Physical Exam Vitals and nursing note reviewed.  Constitutional:      General: She is not in acute distress.    Appearance: Normal appearance. She is obese. She is not toxic-appearing.  HENT:     Head: Normocephalic and atraumatic.     Right Ear: External ear normal.     Left Ear: External ear normal.  Eyes:     General: No scleral icterus.    Extraocular Movements: Extraocular movements intact.     Pupils: Pupils are equal, round, and reactive to light.  Neck:     Vascular: No carotid bruit.  Cardiovascular:     Rate and Rhythm: Normal rate and regular rhythm.     Heart sounds: Normal heart sounds. No murmur heard. Pulmonary:      Effort: Pulmonary effort is normal. No respiratory distress.     Breath sounds: Normal breath sounds. No wheezing, rhonchi or rales.  Abdominal:     General: Abdomen is flat. Bowel sounds are normal.     Palpations: Abdomen is soft.     Tenderness: There is no abdominal tenderness.  Musculoskeletal:        General: No swelling or tenderness. Normal range of motion.     Cervical back: Normal range of motion. No rigidity.  Lymphadenopathy:     Cervical: No cervical adenopathy.  Skin:    General: Skin is warm and dry.     Capillary Refill: Capillary refill takes less than 2  seconds.     Coloration: Skin is not jaundiced.     Findings: Lesion present. No bruising.          Comments: Left elbow lesion: Approx. 0.1 x 0.1 cm papular, round, flesh colored. No erythema, drainage, bleeding, scaling Right elbow lesion: Approx. 1 cm x 0.6 cm thick, crusty, dark in color.  No erythema, drainage, bleeding. Mid back lesion: Approx 1 cm x 0.8 cm irregularly shaped, flesh colored, slightly indurated lesion.  No erythema, drainage, or bleeding.  Neurological:     General: No focal deficit present.     Mental Status: She is alert and oriented to person, place, and time.     Motor: No weakness.     Gait: Gait normal.  Psychiatric:        Mood and Affect: Mood normal.        Behavior: Behavior normal.        Thought Content: Thought content normal.        Judgment: Judgment normal.      Assessment & Plan:   Problem List Items Addressed This Visit       Cardiovascular and Mediastinum   Benign essential HTN - Primary    Chronic.  Blood pressure is well controlled today in clinic.  We will continue amlodipine 10 mg daily.  Recent kidney function from nephrology office yesterday looks good-we will check blood counts today.  Plan to follow-up in 6 months.       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  She is going to pick this up today.  Plan to continue collaboration with endocrinologist.       Relevant Medications   propranolol (INDERAL) 40 MG tablet   Other Relevant Orders   Hemoglobin A1c     Nervous and Auditory   Benign familial tremor    Chronic.  Maintained on primidone and propanolol-continue these medications for now.  They seem to be helping.  Follow-up in 6 months.       Relevant Medications   primidone (MYSOLINE) 50 MG tablet   propranolol (INDERAL) 40 MG tablet     Other   Vitamin D deficiency    Chronic.  Nephrology decreased vitamin D to 1000 units daily.  We will continue with replacement and plan to recheck vitamin D level in 6 months.       Tobacco abuse    Chronic.  Patient is working on cutting back on tobacco use-she is down to 3 cigarettes daily.  Sometimes, she does not smoke at all but it depends on her stress.  Encouraged complete cessation and continued work with dealing with stress through outlets such as walking, deep breathing, reading, etc.       Hyperlipidemia    Chronic.  Has maintained on atorvastatin 40 mg daily well.  We will plan to recheck lipids today, although patient is not fasting.  Goal LDL is less than 100.  Follow-up in 6 months.       Relevant Medications   amLODipine (NORVASC) 10 MG tablet   atorvastatin (LIPITOR) 40 MG tablet   propranolol (INDERAL) 40 MG tablet   Other Relevant Orders   Lipid panel   Other  Visit Diagnoses     Skin lesion       Will refer to Dermatology for evaluation of left elbow and mid back lesion.  Right elbow consistent with wart and patient declines treatment today.   Relevant Orders   Ambulatory referral to Dermatology        Follow up plan: Return in about 6 months (around 06/04/2021) for HLD f/u.

## 2020-12-02 ENCOUNTER — Other Ambulatory Visit: Payer: Self-pay

## 2020-12-02 ENCOUNTER — Ambulatory Visit (INDEPENDENT_AMBULATORY_CARE_PROVIDER_SITE_OTHER): Payer: Medicare HMO | Admitting: Nurse Practitioner

## 2020-12-02 ENCOUNTER — Encounter: Payer: Self-pay | Admitting: Nurse Practitioner

## 2020-12-02 VITALS — BP 118/64 | HR 59 | Temp 98.3°F | Ht 69.0 in | Wt 165.2 lb

## 2020-12-02 DIAGNOSIS — E0520 Thyrotoxicosis with toxic multinodular goiter without thyrotoxic crisis or storm: Secondary | ICD-10-CM | POA: Diagnosis not present

## 2020-12-02 DIAGNOSIS — Z720 Tobacco use: Secondary | ICD-10-CM

## 2020-12-02 DIAGNOSIS — R6889 Other general symptoms and signs: Secondary | ICD-10-CM | POA: Diagnosis not present

## 2020-12-02 DIAGNOSIS — L989 Disorder of the skin and subcutaneous tissue, unspecified: Secondary | ICD-10-CM | POA: Diagnosis not present

## 2020-12-02 DIAGNOSIS — I10 Essential (primary) hypertension: Secondary | ICD-10-CM | POA: Diagnosis not present

## 2020-12-02 DIAGNOSIS — E559 Vitamin D deficiency, unspecified: Secondary | ICD-10-CM

## 2020-12-02 DIAGNOSIS — E782 Mixed hyperlipidemia: Secondary | ICD-10-CM

## 2020-12-02 DIAGNOSIS — G250 Essential tremor: Secondary | ICD-10-CM | POA: Diagnosis not present

## 2020-12-02 DIAGNOSIS — R799 Abnormal finding of blood chemistry, unspecified: Secondary | ICD-10-CM | POA: Diagnosis not present

## 2020-12-02 MED ORDER — PROPRANOLOL HCL 40 MG PO TABS: 20.0000 mg | ORAL_TABLET | Freq: Two times a day (BID) | ORAL | 1 refills | Status: DC

## 2020-12-02 MED ORDER — PRIMIDONE 50 MG PO TABS: 100.0000 mg | ORAL_TABLET | Freq: Every morning | ORAL | 2 refills | Status: DC

## 2020-12-02 MED ORDER — ATORVASTATIN CALCIUM 40 MG PO TABS: 40.0000 mg | ORAL_TABLET | Freq: Every day | ORAL | 1 refills | Status: DC

## 2020-12-02 MED ORDER — AMLODIPINE BESYLATE 10 MG PO TABS: 10.0000 mg | ORAL_TABLET | Freq: Every day | ORAL | 1 refills | Status: DC

## 2020-12-02 NOTE — Assessment & Plan Note (Signed)
Chronic.  Has maintained on atorvastatin 40 mg daily well.  We will plan to recheck lipids today, although patient is not fasting.  Goal LDL is less than 100.  Follow-up in 6 months.

## 2020-12-02 NOTE — Assessment & Plan Note (Signed)
Follows with endocrinology-has not started levothyroxine yet due to cost.  She is going to pick this up today.  Plan to continue collaboration with endocrinologist.

## 2020-12-02 NOTE — Assessment & Plan Note (Signed)
Chronic.  Blood pressure is well controlled today in clinic.  We will continue amlodipine 10 mg daily.  Recent kidney function from nephrology office yesterday looks good-we will check blood counts today.  Plan to follow-up in 6 months.

## 2020-12-02 NOTE — Assessment & Plan Note (Signed)
Chronic.  Maintained on primidone and propanolol-continue these medications for now.  They seem to be helping.  Follow-up in 6 months.

## 2020-12-02 NOTE — Assessment & Plan Note (Signed)
Chronic.  Nephrology decreased vitamin D to 1000 units daily.  We will continue with replacement and plan to recheck vitamin D level in 6 months.

## 2020-12-02 NOTE — Assessment & Plan Note (Signed)
Chronic.  Patient is working on cutting back on tobacco use-she is down to 3 cigarettes daily.  Sometimes, she does not smoke at all but it depends on her stress.  Encouraged complete cessation and continued work with dealing with stress through outlets such as walking, deep breathing, reading, etc.

## 2020-12-07 LAB — CBC WITH DIFFERENTIAL/PLATELET
Absolute Monocytes: 360 cells/uL (ref 200–950)
Basophils Absolute: 40 cells/uL (ref 0–200)
Basophils Relative: 1 %
Eosinophils Absolute: 268 cells/uL (ref 15–500)
Eosinophils Relative: 6.7 %
HCT: 38.1 % (ref 35.0–45.0)
Hemoglobin: 11.8 g/dL (ref 11.7–15.5)
Lymphs Abs: 1468 cells/uL (ref 850–3900)
MCH: 23 pg — ABNORMAL LOW (ref 27.0–33.0)
MCHC: 31 g/dL — ABNORMAL LOW (ref 32.0–36.0)
MCV: 74.4 fL — ABNORMAL LOW (ref 80.0–100.0)
MPV: 10.4 fL (ref 7.5–12.5)
Monocytes Relative: 9 %
Neutro Abs: 1864 cells/uL (ref 1500–7800)
Neutrophils Relative %: 46.6 %
Platelets: 193 10*3/uL (ref 140–400)
RBC: 5.12 10*6/uL — ABNORMAL HIGH (ref 3.80–5.10)
RDW: 15.3 % — ABNORMAL HIGH (ref 11.0–15.0)
Total Lymphocyte: 36.7 %
WBC: 4 10*3/uL (ref 3.8–10.8)

## 2020-12-07 LAB — IRON,TIBC AND FERRITIN PANEL
%SAT: 31 % (calc) (ref 16–45)
Ferritin: 161 ng/mL (ref 16–288)
Iron: 82 ug/dL (ref 45–160)
TIBC: 267 mcg/dL (calc) (ref 250–450)

## 2020-12-07 LAB — TEST AUTHORIZATION

## 2020-12-07 LAB — B12 AND FOLATE PANEL
Folate: 7.6 ng/mL
Vitamin B-12: 1173 pg/mL — ABNORMAL HIGH (ref 200–1100)

## 2020-12-07 LAB — HEMOGLOBIN A1C
Hgb A1c MFr Bld: 5.4 % of total Hgb (ref <5.7)
Mean Plasma Glucose: 108 mg/dL
eAG (mmol/L): 6 mmol/L

## 2020-12-07 LAB — LIPID PANEL
Cholesterol: 133 mg/dL (ref <200)
HDL: 35 mg/dL — ABNORMAL LOW (ref >=50)
LDL Cholesterol (Calc): 79 mg/dL (calc)
Non-HDL Cholesterol (Calc): 98 mg/dL (calc) (ref <130)
Total CHOL/HDL Ratio: 3.8 (calc) (ref <5.0)
Triglycerides: 105 mg/dL (ref <150)

## 2020-12-16 ENCOUNTER — Other Ambulatory Visit: Payer: Self-pay | Admitting: *Deleted

## 2020-12-16 MED ORDER — CHOLECALCIFEROL 25 MCG (1000 UT) PO CAPS: 1000.0000 [IU] | ORAL_CAPSULE | Freq: Every day | ORAL | 3 refills | Status: DC

## 2020-12-28 ENCOUNTER — Other Ambulatory Visit (INDEPENDENT_AMBULATORY_CARE_PROVIDER_SITE_OTHER): Payer: Medicare HMO

## 2020-12-28 ENCOUNTER — Other Ambulatory Visit: Payer: Self-pay

## 2020-12-28 DIAGNOSIS — E038 Other specified hypothyroidism: Secondary | ICD-10-CM | POA: Diagnosis not present

## 2020-12-28 LAB — TSH: TSH: 3.35 u[IU]/mL (ref 0.35–5.50)

## 2020-12-28 LAB — T4, FREE: Free T4: 0.92 ng/dL (ref 0.60–1.60)

## 2020-12-29 ENCOUNTER — Other Ambulatory Visit: Payer: Medicare HMO

## 2021-02-01 DIAGNOSIS — N6081 Other benign mammary dysplasias of right breast: Secondary | ICD-10-CM | POA: Diagnosis not present

## 2021-02-01 LAB — HM MAMMOGRAPHY

## 2021-02-03 ENCOUNTER — Telehealth: Payer: Self-pay | Admitting: Pharmacist

## 2021-02-03 NOTE — Progress Notes (Signed)
Chronic Care Management Pharmacy Assistant   Name: Catherine Lowery  MRN: [id number] DOB: [birth year]  Reason for Encounter: Disease State For HTN.   Conditions to be addressed/monitored: HTN, HLD, Vitamin D deficiency, Tobacco Abuse  Recent office visits:  12/02/20 Catherine Bear, NP. For follow-up. CHANGED Cholecalciferol to 1,000 units daily.   Recent consult visits:  None since 12/01/20  Hospital visits:  None since 12/01/20  Medications: Outpatient Encounter Medications as of 02/03/2021  Medication Sig   amLODipine (NORVASC) 10 MG tablet Take 1 tablet (10 mg total) by mouth daily.   anastrozole (ARIMIDEX) 1 MG tablet Take 1 tablet (1 mg total) by mouth daily.   aspirin (ASPIRIN ADULT) 325 MG tablet enteric coated; may use OTC   atorvastatin (LIPITOR) 40 MG tablet Take 1 tablet (40 mg total) by mouth daily.   Cholecalciferol 25 MCG (1000 UT) capsule Take 1 capsule (1,000 Units total) by mouth daily.   levothyroxine (SYNTHROID) 50 MCG tablet Take 1 tablet (50 mcg total) by mouth daily.   Omega-3 Fatty Acids (FISH OIL PO) Take 1,000 mg by mouth daily.   primidone (MYSOLINE) 50 MG tablet Take 2 tablets (100 mg total) by mouth every morning.   propranolol (INDERAL) 40 MG tablet Take 0.5 tablets (20 mg total) by mouth 2 (two) times daily.   VOLTAREN 1 % GEL APPLY TOPICALLY 4 TIMES DAILY AS NEEDED   No facility-administered encounter medications on file as of 02/03/2021.   Reviewed chart prior to disease state call. Spoke with patient regarding BP  Recent Office Vitals: BP Readings from Last 3 Encounters:  12/02/20 118/64  05/27/20 120/70  05/12/20 (!) 143/90   Pulse Readings from Last 3 Encounters:  12/02/20 (!) 59  05/27/20 65  05/12/20 78    Wt Readings from Last 3 Encounters:  12/02/20 165 lb 3.2 oz (74.9 kg)  05/27/20 175 lb 3.2 oz (79.5 kg)  05/12/20 174 lb (78.9 kg)     Kidney Function Lab Results  Component Value Date/Time   CREATININE 1.09 (H)  02/26/2020 12:43 PM   CREATININE 1.01 (H) 06/23/2019 03:24 PM   CREATININE 1.05 (H) 05/19/2019 10:34 AM   CREATININE 1.13 (H) 12/17/2018 12:51 PM   GFRNONAA 53 (L) 02/26/2020 12:43 PM   GFRNONAA 48 (L) 01/23/2018 08:04 AM   GFRAA >60 06/23/2019 03:24 PM   GFRAA 56 (L) 01/23/2018 08:04 AM    BMP Latest Ref Rng & Units 11/22/2020 02/26/2020 06/23/2019  Glucose 70 - 99 mg/dL - 105(H) 88  BUN 8 - 23 mg/dL - 17 14  Creatinine 0.44 - 1.00 mg/dL - 1.09(H) 1.01(H)  BUN/Creat Ratio 6 - 22 (calc) - - -  Sodium 137 - 147 139 140 141  Potassium 3.4 - 5.3 4.5 4.1 4.0  Chloride 99 - 108 104 106 107  CO2 22 - 32 mmol/L - 25 25  Calcium 8.7 - 10.7 9.4 9.6 8.9    Current antihypertensive regimen:  Propranolol 40 mg 0.5 tablets by mouth 2 times daily. Amlodipine 10 mg 1 tablet by mouth daily.  How often are you checking your Blood Pressure? Patient stated she doesn't check her blood pressure at home.  Current home BP readings: Patient stated she doesn't check her blood pressure at home.  What recent interventions/DTPs have been made by any provider to improve Blood Pressure control since last CPP Visit: None.  Any recent hospitalizations or ED visits since last visit with CPP? Patient stated no.   What diet changes  have been made to improve Blood Pressure Control?  Patient stated she has good appetite. She stated she drinks plenty of water. She stated is is not smoking that much.  What exercise is being done to improve your Blood Pressure Control?  Patient stated she is active around the house daily.   Adherence Review: Is the patient currently on ACE/ARB medication? N/A.  Does the patient have >5 day gap between last estimated fill dates? N/A.  Care Gaps:Not on the list.   Star Rating Drugs:Atorvastatin 40 mg 90 DS 12/02/20   Follow-Up:Pharmacist Review  Charlann Lange, [place] Clinical Pharmacist Assistant [phone]

## 2021-02-08 ENCOUNTER — Ambulatory Visit: Payer: Medicare HMO

## 2021-02-08 ENCOUNTER — Other Ambulatory Visit: Payer: Self-pay

## 2021-02-08 DIAGNOSIS — Z23 Encounter for immunization: Secondary | ICD-10-CM

## 2021-02-28 ENCOUNTER — Other Ambulatory Visit: Payer: Self-pay | Admitting: Internal Medicine

## 2021-02-28 DIAGNOSIS — E038 Other specified hypothyroidism: Secondary | ICD-10-CM

## 2021-03-04 ENCOUNTER — Other Ambulatory Visit: Payer: Self-pay | Admitting: *Deleted

## 2021-03-04 MED ORDER — VITAMIN D3 50 MCG (2000 UT) PO CAPS: 2000.0000 [IU] | ORAL_CAPSULE | Freq: Every day | ORAL | 1 refills | Status: DC

## 2021-03-17 ENCOUNTER — Ambulatory Visit (INDEPENDENT_AMBULATORY_CARE_PROVIDER_SITE_OTHER): Payer: Medicare HMO | Admitting: Nurse Practitioner

## 2021-03-17 ENCOUNTER — Other Ambulatory Visit: Payer: Self-pay

## 2021-03-17 ENCOUNTER — Encounter: Payer: Self-pay | Admitting: Nurse Practitioner

## 2021-03-17 VITALS — BP 122/78 | HR 67 | Temp 97.2°F | Ht 69.0 in | Wt 169.2 lb

## 2021-03-17 DIAGNOSIS — M67432 Ganglion, left wrist: Secondary | ICD-10-CM | POA: Diagnosis not present

## 2021-03-17 NOTE — Progress Notes (Signed)
Subjective:    Patient ID: Catherine Lowery, female    DOB: 1946-04-07, 75 y.o.   MRN: [id number]  HPI: Catherine Lowery is a 75 y.o. female presenting for  Chief Complaint  Patient presents with   Follow-up    Growth on L wrist    LUMP Duration: months Location: left volar wrist Onset: gradual Painful: no Quality: no pain Severity: no pain Status:  bigger Trauma: no Redness: no Bruising: no Recent infection: no Swollen lymph nodes: no Requesting removal: no History of cancer: no Family history of cancer: no History of the same: no Associated signs and symptoms: no fevers, pain, other swollen areas/nodules  Allergies  Allergen Reactions   Morphine And Related Itching   Other Itching and Nausea And Vomiting    "PAIN MEDICINES"   Percocet [Oxycodone-Acetaminophen] Nausea And Vomiting    Outpatient Encounter Medications as of 03/17/2021  Medication Sig   amLODipine (NORVASC) 10 MG tablet Take 1 tablet (10 mg total) by mouth daily.   anastrozole (ARIMIDEX) 1 MG tablet Take 1 tablet (1 mg total) by mouth daily.   aspirin (ASPIRIN ADULT) 325 MG tablet enteric coated; may use OTC   atorvastatin (LIPITOR) 40 MG tablet Take 1 tablet (40 mg total) by mouth daily.   Cholecalciferol (VITAMIN D3) 50 MCG (2000 UT) capsule Take 1 capsule (2,000 Units total) by mouth daily.   levothyroxine (SYNTHROID) 50 MCG tablet TAKE 1 TABLET BY MOUTH EVERY DAY   Omega-3 Fatty Acids (FISH OIL PO) Take 1,000 mg by mouth daily.   primidone (MYSOLINE) 50 MG tablet Take 2 tablets (100 mg total) by mouth every morning.   propranolol (INDERAL) 40 MG tablet Take 0.5 tablets (20 mg total) by mouth 2 (two) times daily.   VOLTAREN 1 % GEL APPLY TOPICALLY 4 TIMES DAILY AS NEEDED   No facility-administered encounter medications on file as of 03/17/2021.    Patient Active Problem List   Diagnosis Date Noted   H/O migraine 07/01/2019   Bradycardia 12/17/2018   Peripheral edema 12/17/2018    Genetic testing 05/20/2018   Family history of breast cancer    Family history of prostate cancer    Family history of ovarian cancer    Malignant neoplasm of lower-inner quadrant of right breast of female, estrogen receptor positive ([place]) 04/11/2018   DNR (do not resuscitate) 05/22/2017   Benign familial tremor 05/16/2016   Vitamin D deficiency 03/01/2016   Primary localized osteoarthritis of left hip 12/29/2015   Encounter for screening colonoscopy 02/18/2015   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 02/04/2015   Hyperlipidemia 01/12/2015   Benign essential HTN 01/12/2015   Tobacco abuse 01/12/2015    Past Medical History:  Diagnosis Date   Cancer ([place]) 03/2018   right breast cancer   Chronic kidney disease    CKD   DJD (degenerative joint disease)    LEFT HIP   Family history of breast cancer    Family history of ovarian cancer    Family history of prostate cancer    Family history of prostate cancer    H/O migraine 07/01/2019   Hyperlipidemia    takes Simvasatin daily   Hypertension    takes Propranolol and HCTZ daioly   Hyperthyroidism    Pneumonia 10/2015   PONV (postoperative nausea and vomiting)    when ether used   Shortness of breath dyspnea    with ambulation   Tremors of nervous system    takes Primidone daily  Relevant past medical, surgical, family and social history reviewed and updated as indicated. Interim medical history since our last visit reviewed.  Review of Systems Per HPI unless specifically indicated above     Objective:    BP 122/78   Pulse 67   Temp (!) 97.2 F (36.2 C)   Ht 5\' 9"  (1.753 m)   Wt 169 lb 3.2 oz (76.7 kg)   LMP 01/24/2000   SpO2 97%   BMI 24.99 kg/m   Wt Readings from Last 3 Encounters:  03/17/21 169 lb 3.2 oz (76.7 kg)  12/02/20 165 lb 3.2 oz (74.9 kg)  05/27/20 175 lb 3.2 oz (79.5 kg)    Physical Exam Vitals and nursing note reviewed.  Constitutional:      General: She is not in acute distress.     Appearance: Normal appearance. She is not toxic-appearing.  Musculoskeletal:     Right wrist: Normal.     Left wrist: No tenderness or bony tenderness. Normal range of motion. Normal pulse.       Arms:     Comments: Approximately 1.5 cm x 1 cm cyst to left volar wrist.  No tenderness to palpation, redness, fluctuance, drainage, or warmth.  Skin:    General: Skin is warm and dry.     Capillary Refill: Capillary refill takes less than 2 seconds.     Coloration: Skin is not jaundiced or pale.     Findings: No erythema.  Neurological:     Mental Status: She is alert and oriented to person, place, and time.     Motor: No weakness.     Gait: Gait normal.  Psychiatric:        Mood and Affect: Mood normal.        Behavior: Behavior normal.        Thought Content: Thought content normal.        Judgment: Judgment normal.      Assessment & Plan:   Problem List Items Addressed This Visit   None Visit Diagnoses     Ganglion cyst of volar aspect of left wrist    -  Primary   Discussed options with patient.  watchful waiting vs. referral to hand specialist for possible aspiration/removal.  Not bothersome - desires watchful waiting.        Follow up plan: Return if symptoms worsen or fail to improve.

## 2021-04-03 NOTE — Progress Notes (Signed)
[place]  [phone] [phone]     ID: Catherine Lowery DOB: 03/18/1946  MR#: [id number]  ZSW#:[id number]  Patient Care Team: [name], NP as PCP - General (Nurse Practitioner) Pixie Casino, MD as PCP - Cardiology (Cardiology) Magrinat, Virgie Dad, MD as Consulting Physician (Oncology) Gery Pray, MD as Consulting Physician (Radiation Oncology) Renette Butters, MD as Attending Physician (Orthopedic Surgery) [name], MD as Consulting Physician (General Surgery) Milus Banister, MD as Attending Physician (Gastroenterology) Penni Bombard, MD as Consulting Physician (Neurology) Estanislado Emms, MD (Inactive) as Consulting Physician (Nephrology) Edythe Clarity, [facility] as Pharmacist (Pharmacist) OTHER MD:   CHIEF COMPLAINT: 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 However Catherine Lowery did not show    Catherine Lowery's last bone density screening on 02/26/2018, showed a T-score of 1.4, which is considered normal.    Since Catherine last visit, Catherine Lowery underwent bilateral diagnostic mammography with tomography and right breast ultrasonography at [facility] on 04/15/2020 showing: breast density category B; right breast asymmetry seen on mammogram is consistent with a fat lobule and is probably benign.  Catherine Lowery returned for short-term follow up on 02/01/2021. Right diagnostic mammogram showed no mammographic evidence of malignancy.   REVIEW OF SYSTEMS: Catherine Lowery   COVID 30 VACCINATION STATUS: Catherine Lowery x4, last 10/2020   HISTORY OF CURRENT ILLNESS: From the original intake note:  Catherine Lowery had routine screening mammography on 02/26/2018 showing a possible abnormality in the right breast. Catherine Lowery underwent unilateral right diagnostic mammography with tomography and right breast  ultrasonography at [facility] on 03/07/2018 showing: Breast Density Category B. On mammography, there is a new 1.5 cm irregular architectural distortion with a spiculated margin in the right breast at 5 o'clock anterior depth. On ultrasound, there is a 2.7 cm irregular solid mass in the right breast at 5 o'clock middle depth. This irregular mass displays posterior acoustic shadowing. No significant abnormalities were seen sonographically in the right axilla.  Accordingly on 03/18/2018 Catherine Lowery proceeded to biopsy of the right breast area in question. The pathology from this procedure showed ([id number]): Invasive mammary carcinoma e-cadherin negative, grade II-III. 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 Prognostic indicators significant for: estrogen receptor, 95% positive and progesterone receptor, 95% positive, both with strong staining intensity. Proliferation marker Ki67 at 5%. HER2 negative by immunohistochemistry (1+).  On 04/10/2018 the patient underwent bilateral breast MRI with and without contrast showing a 2.3 cm spiculated enhancing mass associated with non-masslike enhancement, the total abnormal area measuring 4.6 cm.  There was no evidence of lymph node abnormality and the contralateral breast was benign appearing  The patient's subsequent history is as detailed below.   PAST MEDICAL HISTORY: Past Medical History:  Diagnosis Date   Cancer ([place]) 03/2018   right breast cancer   Chronic kidney disease    CKD   DJD (degenerative joint disease)    LEFT HIP   Family history of breast cancer    Family history of ovarian cancer    Family history of prostate cancer    Family history of prostate cancer    H/O migraine 07/01/2019   Hyperlipidemia    takes Simvasatin daily   Hypertension    takes Propranolol and HCTZ daioly   Hyperthyroidism    Pneumonia 10/2015   PONV (postoperative nausea and vomiting)    when  ether used   Shortness of breath dyspnea    with  ambulation   Tremors of nervous system    takes Primidone daily    PAST SURGICAL HISTORY: Past Surgical History:  Procedure Laterality Date   BOne Spur     left heel   BREAST LUMPECTOMY WITH RADIOACTIVE SEED AND SENTINEL LYMPH NODE BIOPSY Right 05/07/2018   Procedure: RIGHT BREAST LUMPECTOMY WITH RADIOACTIVE SEED AND RIGHT  SENTINEL LYMPH NODE MAPPING;  Surgeon: [name], MD;  Location: [facility];  Service: General;  Laterality: Right;   COLONOSCOPY     JOINT REPLACEMENT Bilateral    knee   KNEE SURGERY     RE-EXCISION OF BREAST LUMPECTOMY Right 06/05/2018   Procedure: RE-EXCISION OF RIGHT BREAST LUMPECTOMY;  Surgeon: [name], MD;  Location: [facility];  Service: General;  Laterality: Right;   ROTATOR CUFF REPAIR Bilateral    TOTAL HIP ARTHROPLASTY Left 12/29/2015   TOTAL HIP ARTHROPLASTY Left 12/29/2015   Procedure: TOTAL HIP ARTHROPLASTY ANTERIOR APPROACH;  Surgeon: Ninetta Lights, MD;  Location: [facility];  Service: Orthopedics;  Laterality: Left;    FAMILY HISTORY Family History  Problem Relation Age of Onset   Hypertension Mother    Heart disease Mother    Stroke Mother    Multiple myeloma Father 38   Thyroid cancer Sister        dx 81's/50's   Cancer Paternal Aunt        unknown   Prostate cancer Paternal Uncle        dx >50- cancer was the cause of his death   Ovarian cancer Paternal Aunt    Throat cancer Paternal Uncle    Breast cancer Cousin        dx >50   Cancer Cousin        type unk   Cancer Cousin        type unk   Cancer Cousin        type unk  Catherine Lowery notes that Catherine father died from a multiple myeloma at age 51. Patients' mother died from stroke at age 68. The patient has 1 brother and 2 sisters. One paternal aunt had ovarian cancer.  One cousin had breast cancer, and Catherine 88s. Another paternal aunt had cancer, type unknown to the patient, and a paternal uncle had prostate cancer.    GYNECOLOGIC HISTORY:  Menarche: 75  years old Age at first live birth: 75 years old GX P: 1 LMP: Patient's last menstrual period was 01/24/2000. 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 Before that, Catherine Lowery has been employed at [employer], [employer], and as an elderly caregiver. 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 Catherine Lowery is separated, and has not spoken to Catherine Lowery, Catherine Lowery, in over 10 years. Catherine Lowery lives with Catherine only son, Catherine Lowery, who is disabled secondary to SCD and multiple surgeries. Catherine Lowery has no pets. Catherine Lowery attends [employer].    ADVANCED DIRECTIVES: Catherine sister, Catherine Lowery, is Catherine Lowery's medical power of attorney. Catherine Lowery can be reached at [phone]. Catherine Lowery has stated that Catherine Lowery, Catherine Lowery, is not to have access to Catherine medical records or to make decisions on Catherine behalf. ]   HEALTH MAINTENANCE: Social History   Tobacco  Use   Smoking status: Every Day    Packs/day: 0.50    Years: 20.00    Pack years: 10.00    Types: Cigarettes   Smokeless tobacco: Never   Tobacco comments:    06/06/16 Pt stated "I'm trying to quit"  Substance Use Topics   Alcohol use: No    Alcohol/week: 0.0 standard drinks   Drug use: No     Colonoscopy: 01/2015  PAP:   Bone density:  02/26/2018: T-score 1.4   Allergies  Allergen Reactions   Morphine And Related Itching   Other Itching and Nausea And Vomiting    "PAIN MEDICINES"   Percocet [Oxycodone-Acetaminophen] Nausea And Vomiting    Current Outpatient Medications  Medication Sig Dispense Refill   amLODipine (NORVASC) 10 MG tablet Take 1 tablet (10 mg total) by mouth daily. 90 tablet 1   anastrozole (ARIMIDEX) 1 MG tablet Take 1 tablet (1 mg total) by mouth daily. 90 tablet 4   aspirin (ASPIRIN ADULT) 325 MG tablet enteric coated; may use OTC 30  tablet 0   atorvastatin (LIPITOR) 40 MG tablet Take 1 tablet (40 mg total) by mouth daily. 90 tablet 1   Cholecalciferol (VITAMIN D3) 50 MCG (2000 UT) capsule Take 1 capsule (2,000 Units total) by mouth daily. 90 capsule 1   levothyroxine (SYNTHROID) 50 MCG tablet TAKE 1 TABLET BY MOUTH EVERY DAY 90 tablet 0   Omega-3 Fatty Acids (FISH OIL PO) Take 1,000 mg by mouth daily.     primidone (MYSOLINE) 50 MG tablet Take 2 tablets (100 mg total) by mouth every morning. 180 tablet 2   propranolol (INDERAL) 40 MG tablet Take 0.5 tablets (20 mg total) by mouth 2 (two) times daily. 90 tablet 1   VOLTAREN 1 % GEL APPLY TOPICALLY 4 TIMES DAILY AS NEEDED 100 g 0   No current facility-administered medications for this visit.    OBJECTIVE: African-American woman in no acute distress  There were no vitals filed for this visit.  Wt Readings from Last 3 Encounters:  03/17/21 169 lb 3.2 oz (76.7 kg)  12/02/20 165 lb 3.2 oz (74.9 kg)  05/27/20 175 lb 3.2 oz (79.5 kg)   There is no height or weight on file to calculate BMI.    ECOG FS:1 - Symptomatic but completely ambulatory      LAB RESULTS:  CMP     Component Value Date/Time   NA 139 11/22/2020 0000   K 4.5 11/22/2020 0000   CL 104 11/22/2020 0000   CO2 25 02/26/2020 1243   GLUCOSE 105 (H) 02/26/2020 1243   BUN 17 02/26/2020 1243   CREATININE 1.09 (H) 02/26/2020 1243   CREATININE 1.05 (H) 05/19/2019 1034   CALCIUM 9.4 11/22/2020 0000   PROT 7.6 02/26/2020 1243   ALBUMIN 4.3 11/22/2020 0000   AST 21 02/26/2020 1243   ALT 18 02/26/2020 1243   ALKPHOS 93 02/26/2020 1243   BILITOT 0.5 02/26/2020 1243   GFRNONAA 53 (L) 02/26/2020 1243   GFRNONAA 48 (L) 01/23/2018 0804   GFRAA >60 06/23/2019 1524   GFRAA 56 (L) 01/23/2018 0804    No results found for: TOTALPROTELP, ALBUMINELP, A1GS, A2GS, BETS, BETA2SER, GAMS, MSPIKE, SPEI  No results found for: KPAFRELGTCHN, LAMBDASER, KAPLAMBRATIO  Lab Results  Component Value Date   WBC 4.0  12/02/2020   NEUTROABS 1,864 12/02/2020   HGB 11.8 12/02/2020   HCT 38.1 12/02/2020   MCV 74.4 (L) 12/02/2020   PLT 193 12/02/2020   No results found  for: LABCA2  No components found for: TDSKAJ681  No results for input(s): INR in the last 168 hours.  No results found for: LABCA2  No results found for: LXB262  No results found for: MBT597  No results found for: CBU384  No results found for: CA2729  No components found for: HGQUANT  No results found for: CEA1 / No results found for: CEA1   No results found for: AFPTUMOR  No results found for: [facility]  Lab Results  Component Value Date   HGBA 70.8 (L) 06/23/2019   HGBA2QUANT 3.6 (H) 06/23/2019   HGBFQUANT 0.0 06/23/2019   HGBSQUAN 25.6 (H) 06/23/2019   (Hemoglobinopathy evaluation)   Lab Results  Component Value Date   LDH 147 06/23/2019    Lab Results  Component Value Date   IRON 82 12/02/2020   TIBC 267 12/02/2020   IRONPCTSAT 31 12/02/2020   (Iron and TIBC)  Lab Results  Component Value Date   FERRITIN 161 12/02/2020    Urinalysis    Component Value Date/Time   COLORURINE YELLOW 09/21/2017 1533   APPEARANCEUR CLOUDY (A) 09/21/2017 1533   LABSPEC 1.015 09/21/2017 1533   PHURINE 5.5 09/21/2017 1533   GLUCOSEU NEGATIVE 09/21/2017 1533   HGBUR TRACE (A) 09/21/2017 1533   BILIRUBINUR SMALL (A) 03/24/2011 1054   KETONESUR NEGATIVE 09/21/2017 1533   PROTEINUR NEGATIVE 09/21/2017 1533   UROBILINOGEN 0.2 03/24/2011 1054   NITRITE NEGATIVE 09/21/2017 1533   LEUKOCYTESUR TRACE (A) 09/21/2017 1533    STUDIES:  No results found.   ELIGIBLE FOR AVAILABLE RESEARCH PROTOCOL: no   ASSESSMENT: 75 y.o. [employer], [place] woman status post right breast lower inner quadrant biopsy 03/18/2018 for a clinical T2N0, [name], grade 2 or 3, estrogen and progesterone receptor positive, Catherine-2 not amplified, with an MIB-1 of 5%  (1) Genetic testing performed through [facility]  Common Hereditary Cancers Panel + Thyroid Cancer Panel on 05/09/2018 showing no deleterious mutations APC, [name], [name], [name], [name], [name], [name], [name], [name], [name], [name], [name], [name], [name], [name], [name], [name], [name], [name], [name], [name], KIT, MEN1, [name], [name], [name], [name], [name], [name], [name], [name], [name], [name], [name], [name], [name], POLE, [name], [name], RAD50, [name], [name], [name], [name], [name], [name], [name], [name], [name], [name], [name], [name], [name], [name], [name], VHL.   (a) Two variants of uncertain significance in the genes [name] c.1176C>T (Silent) and [name] c.1419+4C>T (Intronic) were identified.  (2) right lumpectomy and sentinel lymph node sampling 05/07/2018 showed a pT2 pN1, [name], grade 2, with a positive inferior margin  (a) reexcision scheduled for 06/05/2018  (3) MammaPrint obtained from the definitive surgical sample read as "low risk" predicting a disease-free survival at 5 years of 97.8% without need of chemotherapy.  (4) adjuvant radiation 07/11/2018 - 08/27/2018  (a) 1. Right breast; [name]; [name]; 6 fractions of 2 Gy for a total of 12 Gy  (5) anastrozole started 10/02/2018  (a) bone density 10/29/219 at [facility] shows a T scoe of +1.4 (normal)  (6) genetic testing 05/18/2018 through the Common Hereditary Cancers Panel + Thyroid [place] panel showed  no deleterious mutations in APC, [name], [name], [name], [name], [name], [name], [name], [name], [name], [name], [name], [name], [name], [name], [name], [name], [name], [name], [name], [name], KIT, MEN1, [name], [name], [name], [name], [name], [name], [name], [name], [name], [name], [name], [name], [name], POLE, [name], [name], RAD50, [name], [name], [name], [name], [name], [name], [name], [name], [name], [name], [name], [name], [name], [name], [name], VHL.  (a) 2 variants of uncertain  significance in the genes [name] c.1176C>T (Silent) and [name] c.1419+4C>T (Intronic) were identified.    (b) UPDATE: The [name] c.1419+4C>T (Intronic) VUS has been reclassified to "Likely Benign." The report date is 08/08/2018.  (7) the patient has thalassemia/sickle cell trait: Hemoglobin electrophoresis 06/23/2019 shows hemoglobin 870.8%, [name], hemoglobin A2 elevated at 3.6%   PLAN: Catherine Lowery did not show for Catherine 04/04/2021 visit. A follow up letter has been sent   Magrinat, Virgie Dad, MD  04/03/21 7:59 PM Medical Oncology and Hematology [facility] [place],  [address] Tel. [phone]    Fax. [phone]   I, Wilburn Mylar, am acting as scribe for Dr. Virgie Dad. Magrinat.  I, [name] MD, have reviewed the above documentation for accuracy and completeness, and I agree with the above.   *Total Encounter Time as defined by the Centers for Medicare and Medicaid Services includes, in addition to the face-to-face time of a patient visit (documented in the note above) non-face-to-face time: obtaining and reviewing outside history, ordering and reviewing medications, tests or procedures, care coordination (communications with other health care professionals or caregivers) and documentation in the medical record.

## 2021-04-04 ENCOUNTER — Other Ambulatory Visit: Payer: Medicare HMO

## 2021-04-04 ENCOUNTER — Ambulatory Visit (HOSPITAL_BASED_OUTPATIENT_CLINIC_OR_DEPARTMENT_OTHER): Payer: Medicare HMO | Admitting: Oncology

## 2021-04-04 ENCOUNTER — Encounter: Payer: Self-pay | Admitting: Oncology

## 2021-04-04 DIAGNOSIS — C50311 Malignant neoplasm of lower-inner quadrant of right female breast: Secondary | ICD-10-CM

## 2021-04-04 DIAGNOSIS — Z170 Estrogen receptor positive status [ER+]: Secondary | ICD-10-CM

## 2021-04-17 ENCOUNTER — Other Ambulatory Visit: Payer: Self-pay | Admitting: Internal Medicine

## 2021-04-17 DIAGNOSIS — E038 Other specified hypothyroidism: Secondary | ICD-10-CM

## 2021-04-19 ENCOUNTER — Other Ambulatory Visit: Payer: Self-pay

## 2021-04-19 DIAGNOSIS — I10 Essential (primary) hypertension: Secondary | ICD-10-CM

## 2021-04-19 MED ORDER — AMLODIPINE BESYLATE 10 MG PO TABS: 10.0000 mg | ORAL_TABLET | Freq: Every day | ORAL | 1 refills | Status: DC

## 2021-05-05 DIAGNOSIS — Z1231 Encounter for screening mammogram for malignant neoplasm of breast: Secondary | ICD-10-CM | POA: Diagnosis not present

## 2021-05-05 DIAGNOSIS — Z780 Asymptomatic menopausal state: Secondary | ICD-10-CM | POA: Diagnosis not present

## 2021-05-05 LAB — HM DEXA SCAN

## 2021-05-05 LAB — HM MAMMOGRAPHY

## 2021-05-06 ENCOUNTER — Telehealth: Payer: Self-pay | Admitting: *Deleted

## 2021-05-06 NOTE — Telephone Encounter (Signed)
This RN received Bone density report showing mild decrease in bone density. Pt is on AI for history of lobular breast cancer with noted no show for follow up in Dec with Dr Jannifer Rodney.  This RN sent an LOS requesting lab and f/u with Dr Chryl Heck- next available.

## 2021-05-10 ENCOUNTER — Telehealth: Payer: Self-pay | Admitting: Hematology and Oncology

## 2021-05-10 NOTE — Telephone Encounter (Signed)
Sch per 1/9 inbasket, pt aware °

## 2021-05-11 ENCOUNTER — Ambulatory Visit: Payer: Medicare HMO | Admitting: Hematology and Oncology

## 2021-05-11 ENCOUNTER — Telehealth: Payer: Self-pay | Admitting: Pharmacist

## 2021-05-11 ENCOUNTER — Other Ambulatory Visit: Payer: Medicare HMO

## 2021-05-11 NOTE — Progress Notes (Signed)
° ° °  Chronic Care Management Pharmacy Assistant   Name: Catherine Lowery  MRN: [id number] DOB: Dec 12, 1945   Reason for Encounter: Disease State - Hypertension Call     Recent office visits:  03/17/21 [name], NP - Ganglion cyst - Discussed options with patient.  watchful waiting vs. referral to hand specialist for possible aspiration/removal.  Not bothersome - desires watchful waiting. Follow up as needed.   Recent consult visits:  04/04/21 [name], MD - Oncology - Malignant neoplasm - [name] did not show for her 04/04/2021 visit. A follow up letter has been sent   Hospital visits:  None in previous 6 months   Medications: Outpatient Encounter Medications as of 05/11/2021  Medication Sig   amLODipine (NORVASC) 10 MG tablet Take 1 tablet (10 mg total) by mouth daily.   anastrozole (ARIMIDEX) 1 MG tablet Take 1 tablet (1 mg total) by mouth daily.   aspirin (ASPIRIN ADULT) 325 MG tablet enteric coated; may use OTC   atorvastatin (LIPITOR) 40 MG tablet Take 1 tablet (40 mg total) by mouth daily.   Cholecalciferol (VITAMIN D3) 50 MCG (2000 UT) capsule Take 1 capsule (2,000 Units total) by mouth daily.   levothyroxine (SYNTHROID) 50 MCG tablet TAKE 1 TABLET BY MOUTH EVERY DAY   Omega-3 Fatty Acids (FISH OIL PO) Take 1,000 mg by mouth daily.   primidone (MYSOLINE) 50 MG tablet Take 2 tablets (100 mg total) by mouth every morning.   propranolol (INDERAL) 40 MG tablet Take 0.5 tablets (20 mg total) by mouth 2 (two) times daily.   VOLTAREN 1 % GEL APPLY TOPICALLY 4 TIMES DAILY AS NEEDED   No facility-administered encounter medications on file as of 05/11/2021.    Current antihypertensive regimen:  Propranolol 40 mg 0.5 tablets by mouth 2 times daily. Amlodipine 10 mg 1 tablet by mouth daily  How often are you checking your Blood Pressure?     Current home BP readings:    What recent interventions/DTPs have been made by any provider to improve Blood Pressure control  since last CPP Visit:     Any recent hospitalizations or ED visits since last visit with CPP?  Patient has not had any hospitalizations or ED visits since last visit with CPP.   What diet changes have been made to improve Blood Pressure Control?     What exercise is being done to improve your Blood Pressure Control?      Adherence Review: Is the patient currently on ACE/ARB medication? No Does the patient have >5 day gap between last estimated fill dates? No   Care Gaps  AWV: overdue Colonoscopy: done 02/26/15 DM Eye Exam: N/A DM Foot Exam: N/A Microalbumin: N/A HbgAIC: done 12/02/20 (5.4) DEXA: done 05/05/21 Mammogram: done 05/05/21    Star Rating Drugs: Atorvastatin 40 mg - last filled 02/17/21 90 days   Future Appointments  Date Time Provider [place]  05/18/2021  8:15 AM [facility] None  05/18/2021  8:45 AM [name], MD [facility] None  06/01/2021  8:00 AM [name], MD [facility] None  06/06/2021 10:15 AM [name], NP [facility] None  07/18/2021 10:00 AM [name], [name], MD [facility] [place], [place] Pharmacist Assistant  [phone]   Multiple attempts were made to contact patient. Attempts were unsuccessful. / ls,CMA

## 2021-05-16 ENCOUNTER — Telehealth: Payer: Self-pay | Admitting: Hematology and Oncology

## 2021-05-16 NOTE — Telephone Encounter (Signed)
Scheduled appointment per 1/16 scheduling message. Patient is aware.

## 2021-05-18 ENCOUNTER — Other Ambulatory Visit: Payer: Medicare HMO

## 2021-05-18 ENCOUNTER — Ambulatory Visit: Payer: Medicare HMO | Admitting: Hematology and Oncology

## 2021-06-01 ENCOUNTER — Encounter: Payer: Self-pay | Admitting: Internal Medicine

## 2021-06-01 ENCOUNTER — Telehealth: Payer: Self-pay

## 2021-06-01 ENCOUNTER — Ambulatory Visit: Payer: Medicare HMO | Admitting: Internal Medicine

## 2021-06-01 ENCOUNTER — Other Ambulatory Visit: Payer: Self-pay

## 2021-06-01 VITALS — BP 120/68 | HR 60 | Ht 69.0 in | Wt 170.0 lb

## 2021-06-01 DIAGNOSIS — E042 Nontoxic multinodular goiter: Secondary | ICD-10-CM

## 2021-06-01 DIAGNOSIS — E038 Other specified hypothyroidism: Secondary | ICD-10-CM | POA: Diagnosis not present

## 2021-06-01 DIAGNOSIS — E890 Postprocedural hypothyroidism: Secondary | ICD-10-CM | POA: Diagnosis not present

## 2021-06-01 LAB — T4, FREE: Free T4: 1.2 ng/dL (ref 0.60–1.60)

## 2021-06-01 LAB — TSH: TSH: 1.64 u[IU]/mL (ref 0.35–5.50)

## 2021-06-01 MED ORDER — LEVOTHYROXINE SODIUM 50 MCG PO TABS: 50.0000 ug | ORAL_TABLET | Freq: Every day | ORAL | 3 refills | Status: AC

## 2021-06-01 NOTE — Telephone Encounter (Signed)
Pt called requesting a refill on Levothyroxine. Advised of labs result and rx has sent to preferred pharmacy.

## 2021-06-01 NOTE — Progress Notes (Addendum)
Patient ID: Catherine Lowery, female   DOB: 10-18-1945, 76 y.o.   MRN: [id number]  This visit occurred during the SARS-CoV-2 public health emergency.  Safety protocols were in place, including screening questions prior to the visit, additional usage of staff PPE, and extensive cleaning of exam room while observing appropriate contact time as indicated for disinfecting solutions.   HPI  Catherine Lowery is a 76 y.o.-year-old female, initially referred by her cardiologist, Dr. Debara Pickett, returning for f/u for history of toxic MNG, with subclinical thyrotoxicosis, now s/p RAI tx. Last visit 6 months ago.  Interim hx: At this visit, she is feeling well, without complaints. She is worried about her son, who is in the hospital again after having had pneumonia; not possible congestive heart failure.  Reviewed and addended history: Pt was found to have a low TSH at the time of a recent hospitalization for CP in 12/2014. She r/o for an AMI. She had a follow up visit with Dr Debara Pickett >> a TSH was repeated >> better, but still low.  Thyroid Uptake and scan (11/30/2015) report reviewed:  The 24 hour radioactive iodine uptake is equal to 21.7%.   Heterogeneous tracer activity is identified within both lobes of the thyroid gland. Relative areas of decreased uptake localizing to the upper pole of the left lobe, mid right lobe and upper pole of right lobe is noted.   IMPRESSION: 1. 24 hour radioactive iodine uptake is within normal limits at 21.7%. 2. Multi nodular thyroid gland.       We next obtained a thyroid U/S (12/09/2015) to investigate the upper "cold" thyroid nodules - however, these were small, while the dominant nodules appeared "hot" >> we did not have to biopsy them: Right thyroid lobe: 5.9 x 2.0 x 2.4 cm. Multiple nodules are scattered throughout the right lobe.  1.3 x 0.7 x 0.9 cm mid lobe solid nodule. 1.7 x 1.3 x 0.8 cm upper pole solid nodule.  1.9 x 0.9 x 1.2 cm lower pole nodule.   Other scattered smaller nodules are noted.  Left thyroid lobe: 5.6 x 1.5 x 2.0 cm. Multiple solid nodules are scattered throughout the left lobe.  1.0 x 1.1 x 1.4 cm mid lobe nodule.  2.1 x 0.9 x 1.2 cm lower pole nodule.  1.4 x 1.0 x 1.1 cm upper pole nodule.  Other smaller nodules are noted. Isthmus Thickness: 0.4 cm.  Small subcentimeter nodules are present. Lymphadenopathy: None visualized.  Pt had RAI tx (02/24/2016): 29.4 mCi   Thyroid U/S (11/26/2019): Parenchymal Echotexture: Moderately heterogenous Isthmus: 0.2 cm Right lobe: 4.6 cm x 1.2 cm x 1.7 cm Left lobe: 3.4 cm x 1.1 cm x 1.7 cm _________________________________________________________   Nodule labeled 1 right mid thyroid, decreased in size to 6 mm, indicative of benign behavior. TR 4 and does not meet criteria for surveillance or biopsy.   Nodule labeled 2 in the right mid thyroid, decreased to 7 mm indicative of benign behavior. This remains TR 4 with central macrocalcification. Nodule does not meet criteria for surveillance or biopsy.   Nodule labeled 3 in the right mid thyroid, decreased to 6 mm indicative of benign behavior. Spongiform appearance and does not meet criteria for surveillance or biopsy.   Nodule labeled 4 in the left mid thyroid, 1.1 cm, decreased from 1.4 cm, indicating benign behavior. TR 4 and meets criteria for surveillance.   Nodule labeled 5 inferior left thyroid, decreased to 7 mm indicative of benign behavior. This nodule is TR  2 and does not meet criteria for further surveillance or biopsy.   Nodule # 6: Location: Left; Inferior Maximum size: 0.7 cm; Other 2 dimensions: 0.5 cm x 0.5 cm Composition: cannot determine (2) Echogenicity: hypoechoic (2) Nodule does not meet criteria for surveillance or biopsy  _________________________________________________________   No adenopathy   IMPRESSION: Multinodular thyroid.   Left mid thyroid nodule (labeled 4, TR 4, 1.1 cm) meets  criteria for surveillance, as designated by the newly established ACR TI-RADS criteria. Surveillance ultrasound study recommended to be performed annually up to 5 years. Given the decreasing size over time, this is favored to represent a benign nodule, and 5 years of surveillance would span 12/09/2015-August of 2022.  She developed postoperative hypothyroidism.  We started levothyroxine 50 mcg daily in 10/2020.  She takes this: - at 1-2 am - fasting - at least 30 min from b'fast - no calcium - no iron - no multivitamins - no PPIs - not on Biotin besides a MVI taken inconsistently  Reviewed her TFTs: Lab Results  Component Value Date   TSH 3.35 12/28/2020   TSH 7.39 (H) 11/09/2020   TSH 4.99 (H) 05/27/2020   TSH 4.56 (H) 11/21/2019   TSH 5.67 (H) 09/17/2019   TSH 2.87 09/11/2018   TSH 2.27 09/10/2017   TSH 2.43 03/13/2017   TSH 1.89 11/10/2016   TSH 1.80 07/04/2016   FREET4 0.92 12/28/2020   FREET4 0.76 11/09/2020   FREET4 0.78 05/27/2020   FREET4 0.79 11/21/2019   FREET4 0.9 09/17/2019   FREET4 0.93 09/11/2018   FREET4 0.76 09/10/2017   FREET4 0.78 03/13/2017   FREET4 0.67 11/10/2016   FREET4 0.76 07/04/2016    Her Graves' antibodies were not elevated: Lab Results  Component Value Date   TSI 51 03/02/2015   She has chronic hereditary tremor.  She sees neurology (Dr. Amalia Hailey in [place]).  She is on propranolol.  Pt denies: - feeling nodules in neck - dysphagia - choking - SOB with lying down But she does have hoarseness from  smoking.  She has a family history of thyroid cancer in sister.  No FH of thyroid cancer. No h/o radiation tx to head or neck other than RAI treatment. No herbal supplements. No Biotin use. No recent steroids use.   She was diagnosed with breast cancer in 03/2018. She had partial mastectomy, then more extensive R breast sx. she had RxTx up to 07/2018.  She is on Arimidex-has hot flashes.  She takes a B12 vitamin.  ROS: + see  HPI Neurological: + tremors/no numbness/no tingling/no dizziness  I reviewed pt's medications, allergies, PMH, social hx, family hx, and changes were documented in the history of present illness. Otherwise, unchanged from my initial visit note.  Past Medical History:  Diagnosis Date   Cancer ([place]) 03/2018   right breast cancer   Chronic kidney disease    CKD   DJD (degenerative joint disease)    LEFT HIP   Family history of breast cancer    Family history of ovarian cancer    Family history of prostate cancer    Family history of prostate cancer    H/O migraine 07/01/2019   Hyperlipidemia    takes Simvasatin daily   Hypertension    takes Propranolol and HCTZ daioly   Hyperthyroidism    Pneumonia 10/2015   PONV (postoperative nausea and vomiting)    when ether used   Shortness of breath dyspnea    with ambulation   Tremors of nervous  system    takes Primidone daily   Past Surgical History:  Procedure Laterality Date   BOne Spur     left heel   BREAST LUMPECTOMY WITH RADIOACTIVE SEED AND SENTINEL LYMPH NODE BIOPSY Right 05/07/2018   Procedure: RIGHT BREAST LUMPECTOMY WITH RADIOACTIVE SEED AND RIGHT  SENTINEL LYMPH NODE MAPPING;  Surgeon: Harriette Bouillon, MD;  Location: [facility];  Service: General;  Laterality: Right;   COLONOSCOPY     JOINT REPLACEMENT Bilateral    knee   KNEE SURGERY     RE-EXCISION OF BREAST LUMPECTOMY Right 06/05/2018   Procedure: RE-EXCISION OF RIGHT BREAST LUMPECTOMY;  Surgeon: Harriette Bouillon, MD;  Location: [facility];  Service: General;  Laterality: Right;   ROTATOR CUFF REPAIR Bilateral    TOTAL HIP ARTHROPLASTY Left 12/29/2015   TOTAL HIP ARTHROPLASTY Left 12/29/2015   Procedure: TOTAL HIP ARTHROPLASTY ANTERIOR APPROACH;  Surgeon: Loreta Ave, MD;  Location: [facility] OR;  Service: Orthopedics;  Laterality: Left;   Social History   Social History   Marital Status: Married    Spouse Name: N/A   Number of Children: 1    Occupational History   retired   Social History Main Topics   Smoking status: Current Every Day Smoker -- 0.50 packs/day for 20 years   Smokeless tobacco: Not on file   Alcohol Use: No   Drug Use: No   Current Outpatient Medications on File Prior to Visit  Medication Sig Dispense Refill   amLODipine (NORVASC) 10 MG tablet Take 1 tablet (10 mg total) by mouth daily. 90 tablet 1   anastrozole (ARIMIDEX) 1 MG tablet Take 1 tablet (1 mg total) by mouth daily. 90 tablet 4   aspirin (ASPIRIN ADULT) 325 MG tablet enteric coated; may use OTC 30 tablet 0   atorvastatin (LIPITOR) 40 MG tablet Take 1 tablet (40 mg total) by mouth daily. 90 tablet 1   Cholecalciferol (VITAMIN D3) 50 MCG (2000 UT) capsule Take 1 capsule (2,000 Units total) by mouth daily. 90 capsule 1   levothyroxine (SYNTHROID) 50 MCG tablet TAKE 1 TABLET BY MOUTH EVERY DAY 90 tablet 0   Omega-3 Fatty Acids (FISH OIL PO) Take 1,000 mg by mouth daily.     primidone (MYSOLINE) 50 MG tablet Take 2 tablets (100 mg total) by mouth every morning. 180 tablet 2   propranolol (INDERAL) 40 MG tablet Take 0.5 tablets (20 mg total) by mouth 2 (two) times daily. 90 tablet 1   VOLTAREN 1 % GEL APPLY TOPICALLY 4 TIMES DAILY AS NEEDED 100 g 0   No current facility-administered medications on file prior to visit.   Allergies  Allergen Reactions   Morphine And Related Itching   Other Itching and Nausea And Vomiting    "PAIN MEDICINES"   Percocet [Oxycodone-Acetaminophen] Nausea And Vomiting   Family History  Problem Relation Age of Onset   Hypertension Mother    Heart disease Mother    Stroke Mother    Multiple myeloma Father 81   Thyroid cancer Sister        dx 65's/50's   Cancer Paternal Aunt        unknown   Prostate cancer Paternal Uncle        dx >50- cancer was the cause of his death   Ovarian cancer Paternal Aunt    Throat cancer Paternal Uncle    Breast cancer Cousin        dx >50   Cancer Cousin  type unk    Cancer Cousin        type unk   Cancer Cousin        type unk  + see HPI  PE: LMP 01/24/2000  There is no height or weight on file to calculate BMI. Wt Readings from Last 3 Encounters:  03/17/21 169 lb 3.2 oz (76.7 kg)  12/02/20 165 lb 3.2 oz (74.9 kg)  05/27/20 175 lb 3.2 oz (79.5 kg)   Constitutional: Slightly overweight, in NAD Eyes: PERRLA, EOMI, no exophthalmos ENT: moist mucous membranes, no thyromegaly, no cervical lymphadenopathy Cardiovascular: RRR, No MRG, + mild periankle edema B Respiratory: CTA B Musculoskeletal: no deformities, strength intact in all 4 Skin: moist, warm, no rashes Neurological: + tremor with outstretched hands, DTR not elicited in knees due to bilateral TKRs  ASSESSMENT: 1.  Multiple thyroid nodules - H/o Toxic MNG  2. Subclinical hypothyroidism  PLAN:  1. 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  We treated her with RAI ablation in 01/2016, with subsequent normalization of her TFTs, however, her TSH is increased gradually since then and she developed postablative hypothyroidism -At last visit, TSH was only slightly high, but on repeat, this increased to 7.39, so we ended up starting levothyroxine in 10/2020 - latest thyroid labs reviewed with pt. >> normal after starting LT4: Lab Results  Component Value Date   TSH 3.35 12/28/2020  - she continues on LT4 50 mcg daily - pt feels good on this dose. - we discussed about taking the thyroid hormone every day, with water, >30 minutes before breakfast, separated by >4 hours from acid reflux medications, calcium, iron, multivitamins. Pt. is taking it in the middle of the night, as she thought that she needed to separated by 4 hours from all of her medicines.  Reviewing her other medicines, she does not need to wait that long to take them.  I advised her to move the levothyroxine to the morning and take the rest of her medicines at  least 30 minutes later.  She is occasionally taking a multivitamin, which I advised her to take it at lunchtime or later. - will check thyroid tests today: TSH and fT4 - If labs are abnormal, she will need to return for repeat TFTs in 1.5 months - I we will see her back in 6 months  2.  Multiple thyroid nodules -She denies neck compression symptoms -Thyroid size appears to have decreased after RAI treatment -Before last visit, we checked another ultrasound of her thyroid.  The left mid nodule, measuring 1.1 cm required follow-up in 1 year, but the rest of the nodules were not worrisome and not follow-up was needed for these -We will repeat her thyroid ultrasound now  Component     Latest Ref Rng & Units 06/01/2021  TSH     0.35 - 5.50 uIU/mL 1.64  T4,Free(Direct)     0.60 - 1.60 ng/dL 1.20   Thyroid tests are normal.  We will continue the same dose of levothyroxine.   Addendum (07/05/2021):  Thyroid U/S (07/04/2021): Parenchymal Echotexture: Moderately heterogenous  Isthmus: 0.3 cm  Right lobe: 5.0 x 1.5 x 1.3 cm Left lobe: 4.7 x 1.5 x 1.7 cm _________________________________________________________   Nodule labeled 4 is a solid hypoechoic TR 4 nodule in the mid left thyroid lobe which measures 4.0 x 1.0 x 0.7 cm, previously measuring 1.1 cm in July 2021. There remains similar in size and appearance, and  demonstrates 5 year stability.   Multiple other small subcentimeter nodules remain similar and do not meet criteria for further dedicated follow-up or biopsy.   IMPRESSION: Multinodular thyroid gland with a number of small and/or benign nodules as described. Specifically, nodule labeled 4 now demonstrates 5 year stability and is considered benign. There are no new nodules on today's exam that would necessitate further dedicated follow-up or biopsy.  Reviewing the images, the mid left thyroid lobe measures 1.08 cm, not 4 cm in the largest dimension.  I let radiology know and  they will correct/addend the report.  [name], MD PhD [facility] Endocr[facility]

## 2021-06-01 NOTE — Patient Instructions (Addendum)
Please stop at the lab.  Please continue Levothyroxine 50 mcg daily.  Take the thyroid hormone every day, with water, at least 30 minutes before breakfast, separated by at least 4 hours from: - acid reflux medications - calcium - iron - multivitamins  Move levothyroxine to morning and put it on the nightstand.  Let's schedule a new thyroid U/S.  Please come back for a follow-up appointment in 6 months.

## 2021-06-02 ENCOUNTER — Inpatient Hospital Stay: Payer: Medicare HMO | Admitting: Hematology and Oncology

## 2021-06-02 ENCOUNTER — Other Ambulatory Visit: Payer: Self-pay | Admitting: Pharmacist

## 2021-06-02 ENCOUNTER — Encounter: Payer: Self-pay | Admitting: Hematology and Oncology

## 2021-06-02 ENCOUNTER — Inpatient Hospital Stay: Payer: Medicare HMO | Attending: Hematology and Oncology

## 2021-06-02 DIAGNOSIS — I129 Hypertensive chronic kidney disease with stage 1 through stage 4 chronic kidney disease, or unspecified chronic kidney disease: Secondary | ICD-10-CM | POA: Diagnosis not present

## 2021-06-02 DIAGNOSIS — Z8041 Family history of malignant neoplasm of ovary: Secondary | ICD-10-CM | POA: Diagnosis not present

## 2021-06-02 DIAGNOSIS — Z79811 Long term (current) use of aromatase inhibitors: Secondary | ICD-10-CM

## 2021-06-02 DIAGNOSIS — F17210 Nicotine dependence, cigarettes, uncomplicated: Secondary | ICD-10-CM | POA: Insufficient documentation

## 2021-06-02 DIAGNOSIS — N189 Chronic kidney disease, unspecified: Secondary | ICD-10-CM | POA: Insufficient documentation

## 2021-06-02 DIAGNOSIS — Z170 Estrogen receptor positive status [ER+]: Secondary | ICD-10-CM

## 2021-06-02 DIAGNOSIS — C50311 Malignant neoplasm of lower-inner quadrant of right female breast: Secondary | ICD-10-CM | POA: Diagnosis not present

## 2021-06-02 DIAGNOSIS — Z923 Personal history of irradiation: Secondary | ICD-10-CM | POA: Insufficient documentation

## 2021-06-02 DIAGNOSIS — D569 Thalassemia, unspecified: Secondary | ICD-10-CM | POA: Diagnosis not present

## 2021-06-02 DIAGNOSIS — Z8042 Family history of malignant neoplasm of prostate: Secondary | ICD-10-CM | POA: Insufficient documentation

## 2021-06-02 DIAGNOSIS — Z803 Family history of malignant neoplasm of breast: Secondary | ICD-10-CM | POA: Insufficient documentation

## 2021-06-02 DIAGNOSIS — D573 Sickle-cell trait: Secondary | ICD-10-CM | POA: Diagnosis not present

## 2021-06-02 DIAGNOSIS — Z79899 Other long term (current) drug therapy: Secondary | ICD-10-CM | POA: Diagnosis not present

## 2021-06-02 LAB — CBC WITH DIFFERENTIAL/PLATELET
Abs Immature Granulocytes: 0.02 10*3/uL (ref 0.00–0.07)
Basophils Absolute: 0 10*3/uL (ref 0.0–0.1)
Basophils Relative: 1 %
Eosinophils Absolute: 0.3 10*3/uL (ref 0.0–0.5)
Eosinophils Relative: 6 %
HCT: 35.2 % — ABNORMAL LOW (ref 36.0–46.0)
Hemoglobin: 11.7 g/dL — ABNORMAL LOW (ref 12.0–15.0)
Immature Granulocytes: 0 %
Lymphocytes Relative: 27 %
Lymphs Abs: 1.3 10*3/uL (ref 0.7–4.0)
MCH: 23.2 pg — ABNORMAL LOW (ref 26.0–34.0)
MCHC: 33.2 g/dL (ref 30.0–36.0)
MCV: 69.7 fL — ABNORMAL LOW (ref 80.0–100.0)
Monocytes Absolute: 0.5 10*3/uL (ref 0.1–1.0)
Monocytes Relative: 11 %
Neutro Abs: 2.7 10*3/uL (ref 1.7–7.7)
Neutrophils Relative %: 55 %
Platelets: 185 10*3/uL (ref 150–400)
RBC: 5.05 MIL/uL (ref 3.87–5.11)
RDW: 15.2 % (ref 11.5–15.5)
WBC: 4.9 10*3/uL (ref 4.0–10.5)
nRBC: 0 % (ref 0.0–0.2)

## 2021-06-02 LAB — COMPREHENSIVE METABOLIC PANEL
ALT: 12 U/L (ref 0–44)
AST: 15 U/L (ref 15–41)
Albumin: 4 g/dL (ref 3.5–5.0)
Alkaline Phosphatase: 77 U/L (ref 38–126)
Anion gap: 4 — ABNORMAL LOW (ref 5–15)
BUN: 13 mg/dL (ref 8–23)
CO2: 29 mmol/L (ref 22–32)
Calcium: 9.5 mg/dL (ref 8.9–10.3)
Chloride: 107 mmol/L (ref 98–111)
Creatinine, Ser: 0.96 mg/dL (ref 0.44–1.00)
GFR, Estimated: >60 mL/min (ref >60)
Glucose, Bld: 98 mg/dL (ref 70–99)
Potassium: 3.9 mmol/L (ref 3.5–5.1)
Sodium: 140 mmol/L (ref 135–145)
Total Bilirubin: 0.5 mg/dL (ref 0.3–1.2)
Total Protein: 7.2 g/dL (ref 6.5–8.1)

## 2021-06-02 MED ORDER — ANASTROZOLE 1 MG PO TABS: 1.0000 mg | ORAL_TABLET | Freq: Every day | ORAL | 4 refills | Status: DC

## 2021-06-02 NOTE — Progress Notes (Addendum)
[place]  [phone] [phone]     ID: Catherine Lowery DOB: [birth year]  MR#: [id number]  GBM#:[id number]  Patient Care Team: [name], NP as PCP - General (Nurse Practitioner) Pixie Casino, MD as PCP - Cardiology (Cardiology) Magrinat, Virgie Dad, MD as Consulting Physician (Oncology) Gery Pray, MD as Consulting Physician (Radiation Oncology) Renette Butters, MD as Attending Physician (Orthopedic Surgery) [name], MD as Consulting Physician (General Surgery) Milus Banister, MD as Attending Physician (Gastroenterology) Penni Bombard, MD as Consulting Physician (Neurology) Estanislado Emms, MD (Inactive) as Consulting Physician (Nephrology) Edythe Clarity, [facility] as Pharmacist (Pharmacist) OTHER MD:   CHIEF COMPLAINT: 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  Most recent bone density scan showed T score of 1.9, last one in 2019 was 1.4. Last mammogram from Oct 2022, stable post treatment changes of right breast, no mammographic evidence of malignancy. Routine annual screening mammogram recommended. Patient also mentioned to me that she had a mammogram on the same day she had a bone density and the results were normal.  We do not have a report to view this.  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  She has been taking her Arimidex as prescribed and denies any adverse effects from it.  She has been checking her breast and no new breast changes.  No change in bowel or urinary habits.  No new neurological complaints.  Rest of the pertinent 10 point ROS reviewed and unremarkable.   COVID 19 VACCINATION STATUS: Pfizer x4, last  10/2020   HISTORY OF CURRENT ILLNESS: From the original intake note:  Catherine Lowery had routine screening mammography on 02/26/2018 showing a possible abnormality in the right breast. She underwent unilateral right diagnostic mammography with tomography and right breast ultrasonography at [facility] on 03/07/2018 showing: Breast Density Category B. On mammography, there is a new 1.5 cm irregular architectural distortion with a spiculated margin in the right breast at 5 o'clock anterior depth. On ultrasound, there is a 2.7 cm irregular solid mass in the right breast at 5 o'clock middle depth. This irregular mass displays posterior acoustic shadowing. No significant abnormalities were seen sonographically in the right axilla.  Accordingly on 03/18/2018 she proceeded to biopsy of the right breast area in question. The pathology from this procedure showed ([id number]): Invasive mammary carcinoma e-cadherin negative, grade II-III. 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 Prognostic indicators significant for: estrogen receptor, 95% positive and progesterone receptor, 95% positive, both with strong staining intensity. Proliferation marker Ki67 at 5%. HER2 negative by immunohistochemistry (1+).  On 04/10/2018 the patient underwent bilateral breast MRI with and without contrast showing a 2.3 cm spiculated enhancing mass associated with non-masslike enhancement, the total abnormal area measuring 4.6 cm.  There was no evidence of lymph node abnormality and the contralateral breast was benign appearing  The patient's subsequent history is as detailed below.   PAST MEDICAL HISTORY: Past Medical History:  Diagnosis Date   Cancer ([place]) 03/2018   right breast cancer   Chronic kidney disease    CKD   DJD (degenerative joint disease)    LEFT HIP   Family history of breast cancer    Family history of ovarian  cancer    Family history of prostate cancer    Family history of prostate  cancer    H/O migraine 07/01/2019   Hyperlipidemia    takes Simvasatin daily   Hypertension    takes Propranolol and HCTZ daioly   Hyperthyroidism    Pneumonia 10/2015   PONV (postoperative nausea and vomiting)    when ether used   Shortness of breath dyspnea    with ambulation   Tremors of nervous system    takes Primidone daily    PAST SURGICAL HISTORY: Past Surgical History:  Procedure Laterality Date   BOne Spur     left heel   BREAST LUMPECTOMY WITH RADIOACTIVE SEED AND SENTINEL LYMPH NODE BIOPSY Right 05/07/2018   Procedure: RIGHT BREAST LUMPECTOMY WITH RADIOACTIVE SEED AND RIGHT  SENTINEL LYMPH NODE MAPPING;  Surgeon: [name], MD;  Location: [facility];  Service: General;  Laterality: Right;   COLONOSCOPY     JOINT REPLACEMENT Bilateral    knee   KNEE SURGERY     RE-EXCISION OF BREAST LUMPECTOMY Right 06/05/2018   Procedure: RE-EXCISION OF RIGHT BREAST LUMPECTOMY;  Surgeon: [name], MD;  Location: [facility];  Service: General;  Laterality: Right;   ROTATOR CUFF REPAIR Bilateral    TOTAL HIP ARTHROPLASTY Left 12/29/2015   TOTAL HIP ARTHROPLASTY Left 12/29/2015   Procedure: TOTAL HIP ARTHROPLASTY ANTERIOR APPROACH;  Surgeon: Ninetta Lights, MD;  Location: [facility];  Service: Orthopedics;  Laterality: Left;    FAMILY HISTORY Family History  Problem Relation Age of Onset   Hypertension Mother    Heart disease Mother    Stroke Mother    Multiple myeloma Father 23   Thyroid cancer Sister        dx 53's/50's   Cancer Paternal Aunt        unknown   Prostate cancer Paternal Uncle        dx >50- cancer was the cause of his death   Ovarian cancer Paternal Aunt    Throat cancer Paternal Uncle    Breast cancer Cousin        dx >50   Cancer Cousin        type unk   Cancer Cousin        type unk   Cancer Cousin        type unk  She notes that her father died from a multiple myeloma at age 29. Patients' mother died from stroke at  age 23. The patient has 1 brother and 2 sisters. One paternal aunt had ovarian cancer.  One cousin had breast cancer, and her 40s. Another paternal aunt had cancer, type unknown to the patient, and a paternal uncle had prostate cancer.    GYNECOLOGIC HISTORY:  Menarche: 76 years old Age at first live birth: 76 years old GX P: 1 LMP: Patient's last menstrual period was 01/24/2000. 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 Before that, she has been employed at [employer], [employer], and as an elderly caregiver. 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 She is separated, and has not spoken to her husband, Catherine Lowery, in over 10 years. She lives with her only son, Catherine Lowery, who is disabled secondary to SCD and multiple surgeries. She has no  pets. She attends [employer] [facility].    ADVANCED DIRECTIVES: Her sister, Catherine Lowery, is [place]'s medical power of attorney. Catherine Lowery can be reached at [phone]. Lindo has stated that her husband, Catherine Lowery, is not to have access to her medical records or to make decisions on her behalf. ]   HEALTH MAINTENANCE: Social History   Tobacco Use   Smoking status: Every Day    Packs/day: 0.50    Years: 20.00    Pack years: 10.00    Types: Cigarettes   Smokeless tobacco: Never   Tobacco comments:    06/06/16 Pt stated "I'm trying to quit"  Substance Use Topics   Alcohol use: No    Alcohol/week: 0.0 standard drinks   Drug use: No     Colonoscopy: 01/2015  PAP:   Bone density:  02/26/2018: T-score 1.4   Allergies  Allergen Reactions   Morphine And Related Itching   Other Itching and Nausea And Vomiting    "PAIN MEDICINES"   Percocet [Oxycodone-Acetaminophen] Nausea And Vomiting    Current Outpatient Medications  Medication Sig  Dispense Refill   amLODipine (NORVASC) 10 MG tablet Take 1 tablet (10 mg total) by mouth daily. 90 tablet 1   anastrozole (ARIMIDEX) 1 MG tablet Take 1 tablet (1 mg total) by mouth daily. 90 tablet 4   aspirin (ASPIRIN ADULT) 325 MG tablet enteric coated; may use OTC 30 tablet 0   atorvastatin (LIPITOR) 40 MG tablet Take 1 tablet (40 mg total) by mouth daily. 90 tablet 1   Cholecalciferol (VITAMIN D3) 50 MCG (2000 UT) capsule Take 1 capsule (2,000 Units total) by mouth daily. 90 capsule 1   levothyroxine (SYNTHROID) 50 MCG tablet Take 1 tablet (50 mcg total) by mouth daily. 90 tablet 3   Omega-3 Fatty Acids (FISH OIL PO) Take 1,000 mg by mouth daily.     primidone (MYSOLINE) 50 MG tablet Take 2 tablets (100 mg total) by mouth every morning. 180 tablet 2   propranolol (INDERAL) 40 MG tablet Take 0.5 tablets (20 mg total) by mouth 2 (two) times daily. 90 tablet 1   VOLTAREN 1 % GEL APPLY TOPICALLY 4 TIMES DAILY AS NEEDED 100 g 0   No current facility-administered medications for this visit.    OBJECTIVE: African-American woman in no acute distress  There were no vitals filed for this visit.  Wt Readings from Last 3 Encounters:  06/01/21 170 lb (77.1 kg)  03/17/21 169 lb 3.2 oz (76.7 kg)  12/02/20 165 lb 3.2 oz (74.9 kg)   There is no height or weight on file to calculate BMI.    ECOG FS:1 - Symptomatic but completely ambulatory      LAB RESULTS:  CMP     Component Value Date/Time   NA 139 11/22/2020 0000   K 4.5 11/22/2020 0000   CL 104 11/22/2020 0000   CO2 25 02/26/2020 1243   GLUCOSE 105 (H) 02/26/2020 1243   BUN 17 02/26/2020 1243   CREATININE 1.09 (H) 02/26/2020 1243   CREATININE 1.05 (H) 05/19/2019 1034   CALCIUM 9.4 11/22/2020 0000   PROT 7.6 02/26/2020 1243   ALBUMIN 4.3 11/22/2020 0000   AST 21 02/26/2020 1243   ALT 18 02/26/2020 1243   ALKPHOS 93 02/26/2020 1243   BILITOT 0.5 02/26/2020 1243   GFRNONAA 53 (L) 02/26/2020 1243   GFRNONAA 48 (L) 01/23/2018  0804   GFRAA >60 06/23/2019 1524   GFRAA 56 (L) 01/23/2018 0804    No results  found for: TOTALPROTELP, ALBUMINELP, A1GS, A2GS, BETS, BETA2SER, GAMS, MSPIKE, SPEI  No results found for: KPAFRELGTCHN, LAMBDASER, [facility]  Lab Results  Component Value Date   WBC 4.0 12/02/2020   NEUTROABS 1,864 12/02/2020   HGB 11.8 12/02/2020   HCT 38.1 12/02/2020   MCV 74.4 (L) 12/02/2020   PLT 193 12/02/2020   No results found for: LABCA2  No components found for: ZDGLOV564  No results for input(s): INR in the last 168 hours.  No results found for: LABCA2  No results found for: PPI951  No results found for: OAC166  No results found for: AYT016  No results found for: CA2729  No components found for: HGQUANT  No results found for: CEA1 / No results found for: CEA1   No results found for: AFPTUMOR  No results found for: [facility]  Lab Results  Component Value Date   HGBA 70.8 (L) 06/23/2019   HGBA2QUANT 3.6 (H) 06/23/2019   HGBFQUANT 0.0 06/23/2019   HGBSQUAN 25.6 (H) 06/23/2019   (Hemoglobinopathy evaluation)   Lab Results  Component Value Date   LDH 147 06/23/2019    Lab Results  Component Value Date   IRON 82 12/02/2020   TIBC 267 12/02/2020   IRONPCTSAT 31 12/02/2020   (Iron and TIBC)  Lab Results  Component Value Date   FERRITIN 161 12/02/2020    Urinalysis    Component Value Date/Time   COLORURINE YELLOW 09/21/2017 1533   APPEARANCEUR CLOUDY (A) 09/21/2017 1533   LABSPEC 1.015 09/21/2017 1533   PHURINE 5.5 09/21/2017 1533   GLUCOSEU NEGATIVE 09/21/2017 1533   HGBUR TRACE (A) 09/21/2017 1533   BILIRUBINUR SMALL (A) 03/24/2011 1054   KETONESUR NEGATIVE 09/21/2017 1533   PROTEINUR NEGATIVE 09/21/2017 1533   UROBILINOGEN 0.2 03/24/2011 1054   NITRITE NEGATIVE 09/21/2017 1533   LEUKOCYTESUR TRACE (A) 09/21/2017 1533    STUDIES:  No results found.   ELIGIBLE FOR AVAILABLE RESEARCH PROTOCOL: no   ASSESSMENT: 76 y.o. [employer], [place] woman  status post right breast lower inner quadrant biopsy 03/18/2018 for a clinical T2N0, [name], grade 2 or 3, estrogen and progesterone receptor positive, [name], with an MIB-1 of 5%  (1) Genetic testing performed through [facility] Common Hereditary Cancers Panel + Thyroid Cancer Panel on 05/09/2018 showing no deleterious mutations APC, [name], [name], [name], [name], [name], [name], [name], [name], [name], [name], [name], [name], [name], [name], [name], [name], [name], [name], [name], [name], KIT, MEN1, [name], [name], [name], [name], [name], [name], [name], [name], [name], [name], [name], [name], [name], POLE, [name], [name], RAD50, [name], [name], [name], [name], [name], [name], [name], [name], [name], [name], [name], [name], [name], [name], [name], VHL.   (a) Two variants of uncertain significance in the genes [name] c.1176C>T (Silent) and [name] c.1419+4C>T (Intronic) were identified.  (2) right lumpectomy and sentinel lymph node sampling 05/07/2018 showed a pT2 pN1, [name], grade 2, with a positive inferior margin  (a) reexcision scheduled for 06/05/2018  (3) MammaPrint obtained from the definitive surgical sample read as "low risk" predicting a disease-free survival at 5 years of 97.8% without need of chemotherapy.  (4) adjuvant radiation 07/11/2018 - 08/27/2018  (a) 1. Right breast; [name]; 25 fractions of 1.8 Gy for a total of 45 Gy  3. Boost; 6 fractions of 2 Gy for a total of 12 Gy  (5) anastrozole started 10/02/2018  (a) bone density 05/05/2021, [name]  (6) genetic testing 05/18/2018 through the Common Hereditary Cancers Panel + Thyroid Caner panel showed no deleterious mutations in APC, [name], [name], [name], [name], [name], [name], [name], [name], [name], [name], [name], [name], [name], [name], [name], [name], [name],  [name], [name], [name], KIT, MEN1, [name], [name], [name], [name], [name], [name], [name], [name], [name], [name], [name], [name], [name], POLE, [name], [name], RAD50, [name], [name], [name], [name], [name], [name], [name], [name], [name], [name], [name], [name], [name], [name], [name], VHL.  (a) 2 variants of uncertain significance in the genes [name] c.1176C>T (Silent) and [name] c.1419+4C>T (Intronic) were identified.    (b) UPDATE: The [name] c.1419+4C>T (Intronic) VUS has been reclassified to "Likely Benign." The report date is 08/08/2018.  (7) the patient has thalassemia/sickle cell trait: Hemoglobin electrophoresis 06/23/2019 shows hemoglobin 87.8%, [name], hemoglobin A2 elevated at 3.6%   PLAN:  Ms. Mindi Junker is here for a follow-up on Arimidex.  She has been doing very well on this medication.  She had a recent mammogram with no evidence of malignancy.  No concerning review of systems or physical examination findings concerning for recurrence.  We have reviewed her bone density which showed worsening bone density scan, T score now 1.9.  Recommended to also try over-the-counter calcium supplementation once a day along with her vitamin D supplementation.    Given date from multiple studies/meta-analysis about using adjuvant bisphosphonates in patients while they are on aromatase inhibitors, have discussed about trying adjuvant Zometa every 6 months for a total of 3 years.  We have discussed that this has reduced risk of distant recurrence in the bone as well as reduced bone fractures but does not improve recurrence rates elsewhere or overall survival.  Since she might as well continue to stay on Arimidex for a total of 7 years given her histology, and decreasing bone density and lack of any dental issues (she has dentures), she is agreeable to trying bisphosphonates.  We have also discussed the risk of hypocalcemia from these medications.  She is okay to proceed with it.  Will arrange for these as soon as we have clearance from her  insurance. She will otherwise continue annual mammogram, bone density every 2 years and return to clinic with [place] in 6 months.  Her labs were reviewed, ongoing mild anemia with severe microcytosis most likely thalassemia/sickle cell trait, no need to treat this.  CMP reviewed, no hypocalcemia or creatinine impairment. Total time spent: 40 minutes.  *Total Encounter Time as defined by the Centers for Medicare and Medicaid Services includes, in addition to the face-to-face time of a patient visit (documented in the note above) non-face-to-face time: obtaining and reviewing outside history, ordering and reviewing medications, tests or procedures, care coordination (communications with other health care professionals or caregivers) and documentation in the medical record.

## 2021-06-03 ENCOUNTER — Encounter: Payer: Self-pay | Admitting: Hematology and Oncology

## 2021-06-06 ENCOUNTER — Ambulatory Visit (INDEPENDENT_AMBULATORY_CARE_PROVIDER_SITE_OTHER): Payer: Medicare HMO | Admitting: Nurse Practitioner

## 2021-06-06 ENCOUNTER — Other Ambulatory Visit: Payer: Self-pay

## 2021-06-06 ENCOUNTER — Encounter: Payer: Self-pay | Admitting: Nurse Practitioner

## 2021-06-06 VITALS — BP 122/72 | HR 61 | Ht 69.0 in | Wt 170.0 lb

## 2021-06-06 DIAGNOSIS — I10 Essential (primary) hypertension: Secondary | ICD-10-CM

## 2021-06-06 DIAGNOSIS — C50311 Malignant neoplasm of lower-inner quadrant of right female breast: Secondary | ICD-10-CM | POA: Diagnosis not present

## 2021-06-06 DIAGNOSIS — G250 Essential tremor: Secondary | ICD-10-CM | POA: Diagnosis not present

## 2021-06-06 DIAGNOSIS — E0520 Thyrotoxicosis with toxic multinodular goiter without thyrotoxic crisis or storm: Secondary | ICD-10-CM

## 2021-06-06 DIAGNOSIS — E782 Mixed hyperlipidemia: Secondary | ICD-10-CM | POA: Diagnosis not present

## 2021-06-06 DIAGNOSIS — Z170 Estrogen receptor positive status [ER+]: Secondary | ICD-10-CM | POA: Diagnosis not present

## 2021-06-06 DIAGNOSIS — Z720 Tobacco use: Secondary | ICD-10-CM | POA: Diagnosis not present

## 2021-06-06 MED ORDER — AMLODIPINE BESYLATE 10 MG PO TABS: 10.0000 mg | ORAL_TABLET | Freq: Every day | ORAL | 1 refills | Status: AC

## 2021-06-06 MED ORDER — PRIMIDONE 50 MG PO TABS: 100.0000 mg | ORAL_TABLET | Freq: Every morning | ORAL | 2 refills | Status: AC

## 2021-06-06 MED ORDER — PROPRANOLOL HCL 40 MG PO TABS: 20.0000 mg | ORAL_TABLET | Freq: Two times a day (BID) | ORAL | 1 refills | Status: AC

## 2021-06-06 MED ORDER — ATORVASTATIN CALCIUM 40 MG PO TABS: 40.0000 mg | ORAL_TABLET | Freq: Every day | ORAL | 1 refills | Status: DC

## 2021-06-06 NOTE — Assessment & Plan Note (Signed)
Congratulated patient on cutting back on smoking.  Encouraged complete cessation.

## 2021-06-06 NOTE — Assessment & Plan Note (Signed)
Chronic.  Stable with primidone and propanolol-continue these medications, refill sent in.  Recent electrolytes, kidney function, blood counts reviewed.

## 2021-06-06 NOTE — Assessment & Plan Note (Signed)
Chronic.  Last fasting lipid panel showed LDL less than 100, which is at goal.  Continues on atorvastatin 40 mg daily well.  Refill sent in today.  Continue this medication.

## 2021-06-06 NOTE — Assessment & Plan Note (Signed)
Continue collaboration with oncology. ?

## 2021-06-06 NOTE — Progress Notes (Signed)
Subjective:    Patient ID: Catherine Lowery, female    DOB: 17-Jul-1945, 76 y.o.   MRN: [id number]  HPI: Catherine Lowery is a 76 y.o. female presenting for follow up.  Chief Complaint  Patient presents with   Hypertension   Has familial tremor - reports today is a "good day."  On bad days, she has to have somebody else write for her.  Takes primidone 100 mg every morning and propranolol 40 mg daily.    She is working on quitting smoking with her son.  Reports he quit "cold [place]" after discharge from the hospital this weekend.  She is working on cutting back to quitting.   HYPERTENSION / HYPERLIPIDEMIA Currently taking amlodipine 10 mg daily and atorvastatin 40 mg daily.    BP monitoring frequency: not checking Aspirin: yes Recent stressors: no Recurrent headaches: no Visual changes: no Palpitations: no Dyspnea: no Chest pain: no Lower extremity edema:  yes; improves with elevation Dizzy/lightheaded: no Myalgias: no LDL goal: less than 100 BP goal: less than 140/90 The 10-year ASCVD risk score (Arnett DK, et al., 2019) is: 16.9%   Values used to calculate the score:     Age: 1 years     Sex: Female     Is Non-Hispanic African American: Yes     Diabetic: No     Tobacco smoker: Yes     Systolic Blood Pressure: 024 mmHg     Is BP treated: Yes     HDL Cholesterol: 35 mg/dL     Total Cholesterol: 133 mg/dL  History of toxic multinodular goiter - follows regularly with Endocrinology.   Also has history of breast cancer and follows regularly with Oncology.  She recently had blood work done.   Allergies  Allergen Reactions   Morphine And Related Itching   Other Itching and Nausea And Vomiting    "PAIN MEDICINES"   Percocet [Oxycodone-Acetaminophen] Nausea And Vomiting    Outpatient Encounter Medications as of 06/06/2021  Medication Sig   anastrozole (ARIMIDEX) 1 MG tablet Take 1 tablet (1 mg total) by mouth daily.   aspirin (ASPIRIN ADULT) 325 MG tablet enteric  coated; may use OTC   Cholecalciferol (VITAMIN D3) 50 MCG (2000 UT) capsule Take 1 capsule (2,000 Units total) by mouth daily.   levothyroxine (SYNTHROID) 50 MCG tablet Take 1 tablet (50 mcg total) by mouth daily.   Omega-3 Fatty Acids (FISH OIL PO) Take 1,000 mg by mouth daily.   VOLTAREN 1 % GEL APPLY TOPICALLY 4 TIMES DAILY AS NEEDED   [DISCONTINUED] amLODipine (NORVASC) 10 MG tablet Take 1 tablet (10 mg total) by mouth daily.   [DISCONTINUED] atorvastatin (LIPITOR) 40 MG tablet Take 1 tablet (40 mg total) by mouth daily.   [DISCONTINUED] primidone (MYSOLINE) 50 MG tablet Take 2 tablets (100 mg total) by mouth every morning.   [DISCONTINUED] propranolol (INDERAL) 40 MG tablet Take 0.5 tablets (20 mg total) by mouth 2 (two) times daily.   amLODipine (NORVASC) 10 MG tablet Take 1 tablet (10 mg total) by mouth daily.   atorvastatin (LIPITOR) 40 MG tablet Take 1 tablet (40 mg total) by mouth daily.   primidone (MYSOLINE) 50 MG tablet Take 2 tablets (100 mg total) by mouth every morning.   propranolol (INDERAL) 40 MG tablet Take 0.5 tablets (20 mg total) by mouth 2 (two) times daily.   No facility-administered encounter medications on file as of 06/06/2021.    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   Multiple thyroid nodules 06/01/2021   H/O migraine 07/01/2019   Bradycardia 12/17/2018   Peripheral edema 12/17/2018   Genetic testing 05/20/2018   Family history of breast cancer    Family history of prostate cancer    Family history of ovarian cancer    Malignant neoplasm of lower-inner quadrant of right breast of female, estrogen receptor positive ([place]) 04/11/2018   DNR (do not resuscitate) 05/22/2017   Benign familial tremor 05/16/2016   Vitamin D deficiency 03/01/2016   Primary localized osteoarthritis of left hip 12/29/2015   Encounter for screening colonoscopy 02/18/2015   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 crisis 02/04/2015   Hyperlipidemia 01/12/2015   Benign essential HTN 01/12/2015   Tobacco abuse 01/12/2015    Past Medical History:  Diagnosis Date   Cancer ([place]) 03/2018   right breast cancer   Chronic kidney disease    CKD   DJD (degenerative joint disease)    LEFT HIP   Family history of breast cancer    Family history of ovarian cancer    Family history of prostate cancer    Family history of prostate cancer    H/O migraine 07/01/2019   Hyperlipidemia    takes Simvasatin daily   Hypertension    takes Propranolol and HCTZ daioly   Hyperthyroidism    Pneumonia 10/2015   PONV (postoperative nausea and vomiting)    when ether used   Shortness of breath dyspnea    with ambulation   Tremors of nervous system    takes Primidone daily    Relevant past medical, surgical, family and social history reviewed and updated as indicated. Interim medical history since our last visit reviewed.  Review of Systems Per HPI unless specifically indicated above     Objective:    BP 122/72    Pulse 61    Ht 5\' 9"  (1.753 m)    Wt 170 lb (77.1 kg)    LMP 01/24/2000    SpO2 96%    BMI 25.10 kg/m   Wt Readings from Last 3 Encounters:  06/06/21 170 lb (77.1 kg)  06/02/21 167 lb (75.8 kg)  06/01/21 170 lb (77.1 kg)    Physical Exam Vitals and nursing note reviewed.  Constitutional:      Appearance: Normal appearance. She is normal weight.  Eyes:     General: No scleral icterus.    Extraocular Movements: Extraocular movements intact.     Conjunctiva/sclera: Conjunctivae normal.     Pupils: Pupils are equal, round, and reactive to light.  Cardiovascular:     Rate and Rhythm: Normal rate and regular rhythm.     Pulses: Normal pulses.     Heart sounds: No murmur heard. Pulmonary:     Effort: Pulmonary effort is normal. No respiratory distress.     Breath sounds: Normal breath sounds. No wheezing.  Abdominal:     General: Abdomen is flat. Bowel sounds are normal.      Palpations: Abdomen is soft. There is no mass.     Tenderness: There is no abdominal tenderness. There is no guarding.  Musculoskeletal:     Right lower leg: No edema.     Left lower leg: No edema.  Skin:    General: Skin is warm and dry.     Capillary Refill: Capillary refill takes less than 2 seconds.  Neurological:     Mental Status: She is alert and oriented to person, place, and time.  Mental status is at baseline.     Motor: No weakness.     Coordination: Coordination is intact.     Gait: Gait is intact. Gait normal.  Psychiatric:        Attention and Perception: Attention and perception normal.        Mood and Affect: Mood and affect normal.        Speech: Speech normal.        Behavior: Behavior normal. Behavior is cooperative.        Thought Content: Thought content normal.        Cognition and Memory: Cognition and memory normal.        Judgment: Judgment normal.       Assessment & Plan:   Problem List Items Addressed This Visit       Cardiovascular and Mediastinum   Benign essential HTN - Primary    Chronic.  Blood pressure at goal today in clinic.  Goal less than 140/90.  Continue amlodipine 10 mg daily, refill sent into pharmacy.  Reviewed recent lab work-kidney function stable.  Follow-up with new PCP.      Relevant Medications   amLODipine (NORVASC) 10 MG tablet   atorvastatin (LIPITOR) 40 MG tablet   propranolol (INDERAL) 40 MG tablet     Endocrine   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    Follows endocrinology-continue collaboration.      Relevant Medications   propranolol (INDERAL) 40 MG tablet     Nervous and Auditory   Benign familial tremor    Chronic.  Stable with primidone and propanolol-continue these medications, refill sent in.  Recent electrolytes, kidney function, blood counts reviewed.      Relevant Medications   propranolol (INDERAL) 40 MG tablet   primidone (MYSOLINE) 50 MG tablet     Other   Tobacco abuse    Congratulated  patient on cutting back on smoking.  Encouraged complete cessation.      Malignant neoplasm of lower-inner quadrant of right breast of female, estrogen receptor positive ([place])    Continue collaboration with oncology.      Hyperlipidemia    Chronic.  Last fasting lipid panel showed LDL less than 100, which is at goal.  Continues on atorvastatin 40 mg daily well.  Refill sent in today.  Continue this medication.      Relevant Medications   amLODipine (NORVASC) 10 MG tablet   atorvastatin (LIPITOR) 40 MG tablet   propranolol (INDERAL) 40 MG tablet     Follow up plan: Return for With new PCP.

## 2021-06-06 NOTE — Assessment & Plan Note (Signed)
Follows endocrinology-continue collaboration.

## 2021-06-06 NOTE — Assessment & Plan Note (Signed)
Chronic.  Blood pressure at goal today in clinic.  Goal less than 140/90.  Continue amlodipine 10 mg daily, refill sent into pharmacy.  Reviewed recent lab work-kidney function stable.  Follow-up with new PCP.

## 2021-06-22 ENCOUNTER — Other Ambulatory Visit: Payer: Medicare HMO

## 2021-07-04 ENCOUNTER — Ambulatory Visit
Admission: RE | Admit: 2021-07-04 | Discharge: 2021-07-04 | Disposition: A | Discharge status: Home / Self Care | Payer: Medicare HMO | Source: Ambulatory Visit | Attending: Internal Medicine | Admitting: Internal Medicine

## 2021-07-04 DIAGNOSIS — E041 Nontoxic single thyroid nodule: Secondary | ICD-10-CM | POA: Diagnosis not present

## 2021-07-04 DIAGNOSIS — E042 Nontoxic multinodular goiter: Secondary | ICD-10-CM

## 2021-07-18 ENCOUNTER — Ambulatory Visit: Payer: Medicare HMO | Admitting: Internal Medicine

## 2021-07-18 ENCOUNTER — Encounter: Payer: Self-pay | Admitting: Internal Medicine

## 2021-07-18 ENCOUNTER — Other Ambulatory Visit: Payer: Self-pay

## 2021-07-18 VITALS — BP 120/66 | HR 53 | Ht 69.05 in | Wt 167.0 lb

## 2021-07-18 DIAGNOSIS — E785 Hyperlipidemia, unspecified: Secondary | ICD-10-CM | POA: Diagnosis not present

## 2021-07-18 DIAGNOSIS — I10 Essential (primary) hypertension: Secondary | ICD-10-CM

## 2021-07-18 DIAGNOSIS — E039 Hypothyroidism, unspecified: Secondary | ICD-10-CM | POA: Diagnosis not present

## 2021-07-18 NOTE — Patient Instructions (Signed)
Medication Instructions:  ?Your physician recommends that you continue on your current medications as directed. Please refer to the Current Medication list given to you today. ? ?*If you need a refill on your cardiac medications before your next appointment, please call your pharmacy* ? ? ?Follow-Up: ?At [facility], you and your health needs are our priority.  As part of our continuing mission to provide you with exceptional heart care, we have created designated Provider Care Teams.  These Care Teams include your primary Cardiologist (physician) and Advanced Practice Providers (APPs -  Physician Assistants and Nurse Practitioners) who all work together to provide you with the care you need, when you need it. ? ?We recommend signing up for the patient portal called "MyChart".  Sign up information is provided on this After Visit Summary.  MyChart is used to connect with patients for Virtual Visits (Telemedicine).  Patients are able to view lab/test results, encounter notes, upcoming appointments, etc.  Non-urgent messages can be sent to your provider as well.   ?To learn more about what you can do with MyChart, go to [email or web address].   ? ?Your next appointment:   ? ?12 months with Dr. Debara Pickett  ? ?

## 2021-07-18 NOTE — Progress Notes (Signed)
? ? ?OFFICE NOTE ? ?Chief Complaint:  ?Routine follow-up ? ?Primary Care Physician: ?[name], NP ? ?HPI:  ?Catherine Lowery returns today for hospital follow-up. She presented with atypical chest pain symptoms. She ruled-out for ACS and underwent a myoview, which was negative for ischemia, however, the EF was reduced to 47%. It should also be noted, that she was found to have a suppressed TSH at 0.045.  She has had no treatment for this. She reports no further chest pain today. She denies worsening shortness of breath. She has had worsening tremor. She gets hot flashes a lot and has been sweaty.  ? ?Catherine Lowery returns today for follow-up. Overall she feels well. She denies any significant palpitations on propranolol. She continues to take aspirin 325 mg daily. Blood pressure is well-controlled. She is seeing Dr. Cruzita Lederer for workup of a suppressed TSH however T3 and T4 have been fairly normal. Laboratory work for graves disease and other possible etiologies are still in the works. She's not currently on any medication. ? ?03/29/2016 ? ?Catherine Lowery was seen today in follow-up. Overall she feels well. She recently saw her primary care provider who noted her total cholesterol was elevated 209 with LDL of 125. EKG shows sinus bradycardia with nonspecific T-wave changes. Blood pressure is at goal today. She has follow-up with her endocrinologist in December for further monitoring of her hyperthyroidism. ? ?03/30/2017 ? ?Catherine Lowery returns today for follow-up.  This is a routine annual visit.  She denies any chest pain or worsening shortness of breath.  Lab work in July 2018 showed total cholesterol 148, triglycerides 156, HDL 30 and LDL 87, an improvement from an LDL of 121 about 2 years ago.  She is on atorvastatin 40 mg daily. ? ?04/15/2019 ? ?Catherine Lowery is seen today in follow-up.  Overall she is doing well.  She seems to be in remission from breast cancer.  This required 2 surgeries and  radiation and she is now on Arimidex.  She has some hot flashes but generally feels pretty well.  There was some fatigue but it is improved with B12.  Blood pressures well controlled.  August 2020 or also well controlled with total cholesterol 121, HDL 31, triglycerides 87 and LDL 73.  She denies any chest pain or shortness of breath. ? ?07/18/2021 ? ?Catherine Lowery returns today for follow-up.  She seems to continue to do well.  Blood pressure is well controlled today.  She did have repeat lipids in August which showed improvement in triglycerides.  I had advised her to restart fish oil I last saw her.  Total cholesterol now 133, HDL 35, triglycerides 105 and LDL 79.  She has started taking low-dose levothyroxine per her endocrinologist after thyroid ablation. ? ?PMHx:  ?Past Medical History:  ?Diagnosis Date  ? Cancer ([place]) 03/2018  ? right breast cancer  ? Chronic kidney disease   ? CKD  ? DJD (degenerative joint disease)   ? LEFT HIP  ? Family history of breast cancer   ? Family history of ovarian cancer   ? Family history of prostate cancer   ? Family history of prostate cancer   ? H/O migraine 07/01/2019  ? Hyperlipidemia   ? takes Simvasatin daily  ? Hypertension   ? takes Propranolol and HCTZ daioly  ? Hyperthyroidism   ? Pneumonia 10/2015  ? PONV (postoperative nausea and vomiting)   ? when ether used  ? Shortness of breath dyspnea   ? with ambulation  ?  Tremors of nervous system   ? takes Primidone daily  ? ? ?Past Surgical History:  ?Procedure Laterality Date  ? BOne Spur    ? left heel  ? BREAST LUMPECTOMY WITH RADIOACTIVE SEED AND SENTINEL LYMPH NODE BIOPSY Right 05/07/2018  ? Procedure: RIGHT BREAST LUMPECTOMY WITH RADIOACTIVE SEED AND RIGHT  SENTINEL LYMPH NODE MAPPING;  Surgeon: [name], MD;  Location: [facility];  Service: General;  Laterality: Right;  ? COLONOSCOPY    ? JOINT REPLACEMENT Bilateral   ? knee  ? KNEE SURGERY    ? RE-EXCISION OF BREAST LUMPECTOMY Right 06/05/2018  ?  Procedure: RE-EXCISION OF RIGHT BREAST LUMPECTOMY;  Surgeon: [name], MD;  Location: [facility];  Service: General;  Laterality: Right;  ? ROTATOR CUFF REPAIR Bilateral   ? TOTAL HIP ARTHROPLASTY Left 12/29/2015  ? TOTAL HIP ARTHROPLASTY Left 12/29/2015  ? Procedure: TOTAL HIP ARTHROPLASTY ANTERIOR APPROACH;  Surgeon: Ninetta Lights, MD;  Location: [facility];  Service: Orthopedics;  Laterality: Left;  ? ? ?FAMHx:  ?Family History  ?Problem Relation Age of Onset  ? Hypertension Mother   ? Heart disease Mother   ? Stroke Mother   ? Multiple myeloma Father 50  ? Thyroid cancer Sister   ?     dx 40's/50's  ? Cancer Paternal Aunt   ?     unknown  ? Prostate cancer Paternal Uncle   ?     dx >50- cancer was the cause of his death  ? Ovarian cancer Paternal Aunt   ? Throat cancer Paternal Uncle   ? Breast cancer Cousin   ?     dx >50  ? Cancer Cousin   ?     type unk  ? Cancer Cousin   ?     type unk  ? Cancer Cousin   ?     type unk  ? ? ?SOCHx:  ? reports that she has been smoking cigarettes. She has a 10.00 pack-year smoking history. She has never used smokeless tobacco. She reports that she does not drink alcohol and does not use drugs. ? ?ALLERGIES:  ?Allergies  ?Allergen Reactions  ? Morphine And Related Itching  ? Other Itching and Nausea And Vomiting  ?  "PAIN MEDICINES"  ? Percocet [Oxycodone-Acetaminophen] Nausea And Vomiting  ? ? ?ROS: ?Pertinent items noted in HPI and remainder of comprehensive ROS otherwise negative. ? ?HOME MEDS: ?Current Outpatient Medications  ?Medication Sig Dispense Refill  ? amLODipine (NORVASC) 10 MG tablet Take 1 tablet (10 mg total) by mouth daily. 90 tablet 1  ? anastrozole (ARIMIDEX) 1 MG tablet Take 1 tablet (1 mg total) by mouth daily. 90 tablet 4  ? aspirin (ASPIRIN ADULT) 325 MG tablet enteric coated; may use OTC 30 tablet 0  ? atorvastatin (LIPITOR) 40 MG tablet Take 1 tablet (40 mg total) by mouth daily. 90 tablet 1  ? Cholecalciferol (VITAMIN D3) 50 MCG  (2000 UT) capsule Take 1 capsule (2,000 Units total) by mouth daily. 90 capsule 1  ? levothyroxine (SYNTHROID) 50 MCG tablet Take 1 tablet (50 mcg total) by mouth daily. 90 tablet 3  ? Omega-3 Fatty Acids (FISH OIL PO) Take 1,000 mg by mouth daily.    ? primidone (MYSOLINE) 50 MG tablet Take 2 tablets (100 mg total) by mouth every morning. 180 tablet 2  ? propranolol (INDERAL) 40 MG tablet Take 0.5 tablets (20 mg total) by mouth 2 (two) times daily. 90 tablet 1  ?  VOLTAREN 1 % GEL APPLY TOPICALLY 4 TIMES DAILY AS NEEDED 100 g 0  ? ?No current facility-administered medications for this visit.  ? ? ?LABS/IMAGING: ?No results found for this or any previous visit (from the past 48 hour(s)). ?No results found. ? ?WEIGHTS: ?Wt Readings from Last 3 Encounters:  ?07/18/21 167 lb (75.8 kg)  ?06/06/21 170 lb (77.1 kg)  ?06/02/21 167 lb (75.8 kg)  ? ? ?VITALS: ?BP 120/66   Pulse (!) 53   Ht 5' 9.05" (1.754 m)   Wt 167 lb (75.8 kg)   LMP 01/24/2000   SpO2 96%   BMI 24.63 kg/m?  ? ?EXAM: ?General appearance: alert and no distress ?Neck: no carotid bruit, no JVD and thyroid: enlarged ?Lungs: clear to auscultation bilaterally ?Heart: regular rate and rhythm, S1, S2 normal, no murmur, click, rub or gallop ?Abdomen: soft, non-tender; bowel sounds normal; no masses,  no organomegaly ?Extremities: extremities normal, atraumatic, no cyanosis or edema ?Pulses: 2+ and symmetric ?Skin: Skin color, texture, turgor normal. No rashes or lesions ?Neurologic: Mental status: Alert, oriented, thought content appropriate, fine motor tremor is noted ?Psych: Pleasant ? ?EKG: ?Sinus bradycardia 53, nonspecific T wave changes-personally reviewed ? ?ASSESSMENT: ?Atypical chest pain - negative myoview for ischemia (2016) ?Normal LV function by echo, however EF 47% by Myoview (suspect gating abnormality) -12/2014, LVEF 55-60% by echo (01/2015) ?Subclinical hyperthyroidism - recent radioactive iodine ablation, on thyroid  replacement ?Dyslipidemia ?Breast cancer, status post surgery/radiation/chemotherapy ? ?PLAN: ?1.   Catherine Lowery seems to be doing well with excellent blood pressure control.  Cholesterol is at goal.  She is on low-dose levothyroxine.  No further ch

## 2021-07-21 ENCOUNTER — Telehealth: Payer: Self-pay | Admitting: Pharmacist

## 2021-07-21 NOTE — Progress Notes (Signed)
Duplicate / error LS ?

## 2021-07-21 NOTE — Progress Notes (Signed)
? ? ?Chronic Care Management ?Pharmacy Assistant  ? ?Name: Catherine Lowery  MRN: [id number] DOB: [birth year] ? ? ?Reason for Encounter: Disease State - Hypertension Call  ?  ? ?Recent office visits:  ?06/06/21 Catherine Spanner, NP - Family Medicine - Hypertension - No medication changes. Follow up in new PCP. ? ?Recent consult visits:  ?07/18/21 Catherine Bishop, MD - Cardiology - Hypertension - EKG was done - No medication changes. Follow up in 1 year.  ? ?06/02/21 Catherine Pike, MD -  Oncology - Aromatase inhibitor - No changes noted. Follow up as scheduled.  ? ?Hospital visits:  ?None in previous 6 months ? ?Medications: ?Outpatient Encounter Medications as of 07/21/2021  ?Medication Sig  ? amLODipine (NORVASC) 10 MG tablet Take 1 tablet (10 mg total) by mouth daily.  ? anastrozole (ARIMIDEX) 1 MG tablet Take 1 tablet (1 mg total) by mouth daily.  ? aspirin (ASPIRIN ADULT) 325 MG tablet enteric coated; may use OTC  ? atorvastatin (LIPITOR) 40 MG tablet Take 1 tablet (40 mg total) by mouth daily.  ? Cholecalciferol (VITAMIN D3) 50 MCG (2000 UT) capsule Take 1 capsule (2,000 Units total) by mouth daily.  ? levothyroxine (SYNTHROID) 50 MCG tablet Take 1 tablet (50 mcg total) by mouth daily.  ? Omega-3 Fatty Acids (FISH OIL PO) Take 1,000 mg by mouth daily.  ? primidone (MYSOLINE) 50 MG tablet Take 2 tablets (100 mg total) by mouth every morning.  ? propranolol (INDERAL) 40 MG tablet Take 0.5 tablets (20 mg total) by mouth 2 (two) times daily.  ? VOLTAREN 1 % GEL APPLY TOPICALLY 4 TIMES DAILY AS NEEDED  ? ?No facility-administered encounter medications on file as of 07/21/2021.  ? ?Current antihypertensive regimen:  ?Propranolol 40 mg 0.5 tablets by mouth 2 times daily. ?Amlodipine 10 mg 1 tablet by mouth daily ?  ?How often are you checking your Blood Pressure?  ? Patient reported she has not been checking her blood pressures at home but did have a dr appt on Monday and it was good.  ?  ?  ?Current home BP readings: 120/66  (Monday at Dr Appt) ?  ?  ?What recent interventions/DTPs have been made by any provider to improve Blood Pressure control since last CPP Visit:  ? Patient denied any recent changes in her current medication regimen.  ?  ?  ?Any recent hospitalizations or ED visits since last visit with CPP?  ?Patient has not had any hospitalizations or ED visits since last visit with CPP. ?  ?  ?What diet changes have been made to improve Blood Pressure Control?  ? Patient reported she limits her salt intake currently.  ?  ?  ?What exercise is being done to improve your Blood Pressure Control?  ? 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  ?  ?  ?  ?Adherence Review: ?Is the patient currently on ACE/ARB medication? No ?Does the patient have >5 day gap between last estimated fill dates? No ?  ?  ?Care Gaps ?  ?AWV: overdue ?Colonoscopy: done 02/26/15 ?DM Eye Exam: N/A ?DM Foot Exam: N/A ?Microalbumin: N/A ?HbgAIC: done 12/02/20 (5.4) ?DEXA: done 05/05/21 ?Mammogram: done 05/05/21 ?  ?  ?Star Rating Drugs: ?Atorvastatin 40 mg - last filled 06/06/21 90 days  ? ? ? ?Future Appointments  ?Date Time Provider [place]  ?11/30/2021  9:00 AM [name], MD [facility] None  ?12/02/2021  9:00 AM [facility] None  ?  12/02/2021  9:30 AM Catherine Pike, MD [facility] None  ?12/02/2021 10:30 AM [facility] INFUSION [facility] None  ?06/02/2022  9:00 AM [facility] None  ?06/02/2022  9:30 AM Catherine Pike, MD [facility] None  ?06/02/2022 10:30 AM [facility] INFUSION [facility] None  ? ?Patient has not found a new PCP at this time. She reported she is still looking for a group that accepts her [employer] and they are working with her on this.  ? ?[employer], CCMA ?Clinical Pharmacist Assistant  ?([phone] ? ?

## 2021-08-18 ENCOUNTER — Other Ambulatory Visit: Payer: Self-pay | Admitting: Internal Medicine

## 2021-08-18 DIAGNOSIS — E038 Other specified hypothyroidism: Secondary | ICD-10-CM

## 2021-10-30 ENCOUNTER — Other Ambulatory Visit: Payer: Self-pay | Admitting: Nurse Practitioner

## 2021-10-30 DIAGNOSIS — G250 Essential tremor: Secondary | ICD-10-CM

## 2021-10-30 DIAGNOSIS — E782 Mixed hyperlipidemia: Secondary | ICD-10-CM

## 2021-10-31 NOTE — Telephone Encounter (Signed)
Refilled 06/06/2021 #90 1 refill a 6 month refill. Requested Prescriptions  Pending Prescriptions Disp Refills  . propranolol (INDERAL) 40 MG tablet [Pharmacy Med Name: PROPRANOLOL HCL 40 MG Tablet] 90 tablet 1    Sig: TAKE 1/2 TABLET TWICE DAILY     Cardiovascular:  Beta Blockers Passed - 10/30/2021  4:14 AM      Passed - Last BP in normal range    BP Readings from Last 1 Encounters:  07/18/21 120/66         Passed - Last Heart Rate in normal range    Pulse Readings from Last 1 Encounters:  07/18/21 (!) 53         Passed - Valid encounter within last 6 months    Recent Outpatient Visits          4 months ago Benign essential HTN   [place] [name], NP   7 months ago Ganglion cyst of volar aspect of left wrist   [place] [name], NP   11 months ago Benign essential HTN   [place] [name], NP   1 year ago Need for immunization against influenza   [place], Modena Nunnery, MD   2 years ago Benign essential HTN   [place], Modena Nunnery, MD             . atorvastatin (LIPITOR) 40 MG tablet [Pharmacy Med Name: ATORVASTATIN CALCIUM 40 MG Tablet] 90 tablet 1    Sig: TAKE 1 TABLET EVERY DAY     Cardiovascular:  Antilipid - Statins Failed - 10/30/2021  4:14 AM      Failed - Lipid Panel in normal range within the last 12 months    Cholesterol  Date Value Ref Range Status  12/02/2020 133 <200 mg/dL Final   LDL Cholesterol (Calc)  Date Value Ref Range Status  12/02/2020 79 mg/dL (calc) Final    Comment:    Reference range: <100 . Desirable range <100 mg/dL for primary prevention;   <70 mg/dL for patients with CHD or diabetic patients  with > or = 2 CHD risk factors. Marland Kitchen LDL-C is now calculated using the Martin-Hopkins  calculation, which is a validated novel method providing  better accuracy than the Friedewald equation in the   estimation of LDL-C.  Cresenciano Genre et al. Annamaria Helling. 4097;353(29): 2061-2068  (http://education.[facility].com/faq/FAQ164)    HDL  Date Value Ref Range Status  12/02/2020 35 (L) > OR = 50 mg/dL Final   Triglycerides  Date Value Ref Range Status  12/02/2020 105 <150 mg/dL Final         Passed - Patient is not pregnant      Passed - Valid encounter within last 12 months    Recent Outpatient Visits          4 months ago Benign essential HTN   [facility] [name], NP   7 months ago Ganglion cyst of volar aspect of left wrist   [place] [name], NP   11 months ago Benign essential HTN   [place] [name], NP   1 year ago Need for immunization against influenza   [place], Modena Nunnery, MD   2 years ago Benign essential HTN   [place], Modena Nunnery, MD

## 2021-11-23 DIAGNOSIS — D649 Anemia, unspecified: Secondary | ICD-10-CM | POA: Diagnosis not present

## 2021-11-23 DIAGNOSIS — I129 Hypertensive chronic kidney disease with stage 1 through stage 4 chronic kidney disease, or unspecified chronic kidney disease: Secondary | ICD-10-CM | POA: Diagnosis not present

## 2021-11-23 DIAGNOSIS — I10 Essential (primary) hypertension: Secondary | ICD-10-CM | POA: Diagnosis not present

## 2021-11-23 DIAGNOSIS — E785 Hyperlipidemia, unspecified: Secondary | ICD-10-CM | POA: Diagnosis not present

## 2021-11-23 DIAGNOSIS — N1831 Chronic kidney disease, stage 3a: Secondary | ICD-10-CM | POA: Diagnosis not present

## 2021-11-23 DIAGNOSIS — M1990 Unspecified osteoarthritis, unspecified site: Secondary | ICD-10-CM | POA: Diagnosis not present

## 2021-11-23 DIAGNOSIS — Z79899 Other long term (current) drug therapy: Secondary | ICD-10-CM | POA: Diagnosis not present

## 2021-11-23 DIAGNOSIS — D631 Anemia in chronic kidney disease: Secondary | ICD-10-CM | POA: Diagnosis not present

## 2021-11-23 DIAGNOSIS — E890 Postprocedural hypothyroidism: Secondary | ICD-10-CM | POA: Diagnosis not present

## 2021-11-23 DIAGNOSIS — Z1159 Encounter for screening for other viral diseases: Secondary | ICD-10-CM | POA: Diagnosis not present

## 2021-11-23 DIAGNOSIS — E559 Vitamin D deficiency, unspecified: Secondary | ICD-10-CM | POA: Diagnosis not present

## 2021-11-30 ENCOUNTER — Ambulatory Visit: Payer: Medicare HMO | Admitting: Internal Medicine

## 2021-11-30 ENCOUNTER — Other Ambulatory Visit: Payer: Self-pay | Admitting: *Deleted

## 2021-11-30 NOTE — Progress Notes (Deleted)
Patient ID: Catherine Lowery, female   DOB: July 28, 1945, 76 y.o.   MRN: [id number]  HPI  Catherine Lowery is a 76 y.o.-year-old female, initially referred by her cardiologist, Dr. Debara Pickett, returning for f/u for history of toxic MNG, with subclinical thyrotoxicosis, now s/p RAI tx. Last visit 6 months ago.  Interim hx: At this visit, she is feeling well, without complaints.  Reviewed and addended history: Pt was found to have a low TSH at the time of a recent hospitalization for CP in 12/2014. She r/o for an AMI. She had a follow up visit with Dr Debara Pickett >> a TSH was repeated >> better, but still low.  Thyroid Uptake and scan (11/30/2015) report reviewed:  The 24 hour radioactive iodine uptake is equal to 21.7%.   Heterogeneous tracer activity is identified within both lobes of the thyroid gland. Relative areas of decreased uptake localizing to the upper pole of the left lobe, mid right lobe and upper pole of right lobe is noted.   IMPRESSION: 1. 24 hour radioactive iodine uptake is within normal limits at 21.7%. 2. Multi nodular thyroid gland.       We next obtained a thyroid U/S (12/09/2015) to investigate the upper "cold" thyroid nodules - however, these were small, while the dominant nodules appeared "hot" >> we did not have to biopsy them: Right thyroid lobe: 5.9 x 2.0 x 2.4 cm. Multiple nodules are scattered throughout the right lobe.  1.3 x 0.7 x 0.9 cm mid lobe solid nodule. 1.7 x 1.3 x 0.8 cm upper pole solid nodule.  1.9 x 0.9 x 1.2 cm lower pole nodule.  Other scattered smaller nodules are noted.  Left thyroid lobe: 5.6 x 1.5 x 2.0 cm. Multiple solid nodules are scattered throughout the left lobe.  1.0 x 1.1 x 1.4 cm mid lobe nodule.  2.1 x 0.9 x 1.2 cm lower pole nodule.  1.4 x 1.0 x 1.1 cm upper pole nodule.  Other smaller nodules are noted. Isthmus Thickness: 0.4 cm.  Small subcentimeter nodules are present. Lymphadenopathy: None visualized.  Pt had RAI tx  (02/24/2016): 29.4 mCi   Thyroid U/S (11/26/2019): Parenchymal Echotexture: Moderately heterogenous Isthmus: 0.2 cm Right lobe: 4.6 cm x 1.2 cm x 1.7 cm Left lobe: 3.4 cm x 1.1 cm x 1.7 cm _________________________________________________________   Nodule labeled 1 right mid thyroid, decreased in size to 6 mm, indicative of benign behavior. TR 4 and does not meet criteria for surveillance or biopsy.   Nodule labeled 2 in the right mid thyroid, decreased to 7 mm indicative of benign behavior. This remains TR 4 with central macrocalcification. Nodule does not meet criteria for surveillance or biopsy.   Nodule labeled 3 in the right mid thyroid, decreased to 6 mm indicative of benign behavior. Spongiform appearance and does not meet criteria for surveillance or biopsy.   Nodule labeled 4 in the left mid thyroid, 1.1 cm, decreased from 1.4 cm, indicating benign behavior. TR 4 and meets criteria for surveillance.   Nodule labeled 5 inferior left thyroid, decreased to 7 mm indicative of benign behavior. This nodule is TR 2 and does not meet criteria for further surveillance or biopsy.   Nodule # 6: Location: Left; Inferior Maximum size: 0.7 cm; Other 2 dimensions: 0.5 cm x 0.5 cm Composition: cannot determine (2) Echogenicity: hypoechoic (2) Nodule does not meet criteria for surveillance or biopsy  _________________________________________________________   No adenopathy   IMPRESSION: Multinodular thyroid.   Left mid thyroid nodule (labeled 4,  TR 4, 1.1 cm) meets criteria for surveillance, as designated by the newly established ACR TI-RADS criteria. Surveillance ultrasound study recommended to be performed annually up to 5 years. Given the decreasing size over time, this is favored to represent a benign nodule, and 5 years of surveillance would span 12/09/2015-August of 2022.  Thyroid U/S (07/04/2021): Parenchymal Echotexture: Moderately heterogenous  Isthmus: 0.3 cm   Right lobe: 5.0 x 1.5 x 1.3 cm Left lobe: 4.7 x 1.5 x 1.7 cm _________________________________________________________   Nodule labeled 4 is a solid hypoechoic TR 4 nodule in the mid left thyroid lobe which measures 4.0 x 1.0 x 0.7 cm, previously measuring 1.1 cm in July 2021. There remains similar in size and appearance, and demonstrates 5 year stability.   Multiple other small subcentimeter nodules remain similar and do not meet criteria for further dedicated follow-up or biopsy.   IMPRESSION: Multinodular thyroid gland with a number of small and/or benign nodules as described. Specifically, nodule labeled 4 now demonstrates 5 year stability and is considered benign. There are no new nodules on today's exam that would necessitate further dedicated follow-up or biopsy.  Reviewing the images, the mid left thyroid lobe measures 1.08 cm, not 4 cm in the largest dimension.  I let radiology know and they corrected the report.  She developed postoperative hypothyroidism.  We started levothyroxine 50 mcg daily in 10/2020.  She takes this: - at 1-2 am - fasting - at least 30 min from b'fast - no calcium - no iron - no multivitamins - no PPIs - not on Biotin besides a MVI taken inconsistently  Reviewed her TFTs: Lab Results  Component Value Date   TSH 1.64 06/01/2021   TSH 3.35 12/28/2020   TSH 7.39 (H) 11/09/2020   TSH 4.99 (H) 05/27/2020   TSH 4.56 (H) 11/21/2019   TSH 5.67 (H) 09/17/2019   TSH 2.87 09/11/2018   TSH 2.27 09/10/2017   TSH 2.43 03/13/2017   TSH 1.89 11/10/2016   FREET4 1.20 06/01/2021   FREET4 0.92 12/28/2020   FREET4 0.76 11/09/2020   FREET4 0.78 05/27/2020   FREET4 0.79 11/21/2019   FREET4 0.9 09/17/2019   FREET4 0.93 09/11/2018   FREET4 0.76 09/10/2017   FREET4 0.78 03/13/2017   FREET4 0.67 11/10/2016    Her Graves' antibodies were not elevated: Lab Results  Component Value Date   TSI 51 03/02/2015   She has chronic hereditary tremor.  She  sees neurology (Dr. Amalia Hailey in [place]).  She is on propranolol.  Pt denies: - feeling nodules in neck - dysphagia - choking But she does have hoarseness from  smoking.  She has a family history of thyroid cancer in sister.  No FH of thyroid cancer. No h/o radiation tx to head or neck other than RAI treatment. No herbal supplements. No Biotin use. No recent steroids use.   She was diagnosed with breast cancer in 03/2018. She had partial mastectomy, then more extensive R breast sx. she had RxTx up to 07/2018.  She is on Arimidex-has hot flashes.  She takes a B12 vitamin.  ROS: + see HPI Neurological: + tremors/no numbness/no tingling/no dizziness  I reviewed pt's medications, allergies, PMH, social hx, family hx, and changes were documented in the history of present illness. Otherwise, unchanged from my initial visit note.  Past Medical History:  Diagnosis Date   Cancer ([place]) 03/2018   right breast cancer   Chronic kidney disease    CKD   DJD (degenerative joint disease)  LEFT HIP   Family history of breast cancer    Family history of ovarian cancer    Family history of prostate cancer    Family history of prostate cancer    H/O migraine 07/01/2019   Hyperlipidemia    takes Simvasatin daily   Hypertension    takes Propranolol and HCTZ daioly   Hyperthyroidism    Pneumonia 10/2015   PONV (postoperative nausea and vomiting)    when ether used   Shortness of breath dyspnea    with ambulation   Tremors of nervous system    takes Primidone daily   Past Surgical History:  Procedure Laterality Date   BOne Spur     left heel   BREAST LUMPECTOMY WITH RADIOACTIVE SEED AND SENTINEL LYMPH NODE BIOPSY Right 05/07/2018   Procedure: RIGHT BREAST LUMPECTOMY WITH RADIOACTIVE SEED AND RIGHT  SENTINEL LYMPH NODE MAPPING;  Surgeon: Harriette Bouillon, MD;  Location: [facility];  Service: General;  Laterality: Right;   COLONOSCOPY     JOINT REPLACEMENT Bilateral    knee    KNEE SURGERY     RE-EXCISION OF BREAST LUMPECTOMY Right 06/05/2018   Procedure: RE-EXCISION OF RIGHT BREAST LUMPECTOMY;  Surgeon: Harriette Bouillon, MD;  Location: [facility];  Service: General;  Laterality: Right;   ROTATOR CUFF REPAIR Bilateral    TOTAL HIP ARTHROPLASTY Left 12/29/2015   TOTAL HIP ARTHROPLASTY Left 12/29/2015   Procedure: TOTAL HIP ARTHROPLASTY ANTERIOR APPROACH;  Surgeon: Loreta Ave, MD;  Location: [facility];  Service: Orthopedics;  Laterality: Left;   Social History   Social History   Marital Status: Married    Spouse Name: N/A   Number of Children: 1   Occupational History   retired   Social History Main Topics   Smoking status: Current Every Day Smoker -- 0.50 packs/day for 20 years   Smokeless tobacco: Not on file   Alcohol Use: No   Drug Use: No   Current Outpatient Medications on File Prior to Visit  Medication Sig Dispense Refill   amLODipine (NORVASC) 10 MG tablet Take 1 tablet (10 mg total) by mouth daily. 90 tablet 1   anastrozole (ARIMIDEX) 1 MG tablet Take 1 tablet (1 mg total) by mouth daily. 90 tablet 4   aspirin (ASPIRIN ADULT) 325 MG tablet enteric coated; may use OTC 30 tablet 0   atorvastatin (LIPITOR) 40 MG tablet Take 1 tablet (40 mg total) by mouth daily. 90 tablet 1   Cholecalciferol (VITAMIN D3) 50 MCG (2000 UT) capsule Take 1 capsule (2,000 Units total) by mouth daily. 90 capsule 1   levothyroxine (SYNTHROID) 50 MCG tablet Take 1 tablet (50 mcg total) by mouth daily. 90 tablet 3   Omega-3 Fatty Acids (FISH OIL PO) Take 1,000 mg by mouth daily.     primidone (MYSOLINE) 50 MG tablet Take 2 tablets (100 mg total) by mouth every morning. 180 tablet 2   propranolol (INDERAL) 40 MG tablet Take 0.5 tablets (20 mg total) by mouth 2 (two) times daily. 90 tablet 1   VOLTAREN 1 % GEL APPLY TOPICALLY 4 TIMES DAILY AS NEEDED 100 g 0   No current facility-administered medications on file prior to visit.   Allergies  Allergen  Reactions   Morphine And Related Itching   Other Itching and Nausea And Vomiting    "PAIN MEDICINES"   Percocet [Oxycodone-Acetaminophen] Nausea And Vomiting   Family History  Problem Relation Age of Onset   Hypertension Mother  Heart disease Mother    Stroke Mother    Multiple myeloma Father 35   Thyroid cancer Sister        dx 33's/50's   Cancer Paternal Aunt        unknown   Prostate cancer Paternal Uncle        dx >50- cancer was the cause of his death   Ovarian cancer Paternal Aunt    Throat cancer Paternal Uncle    Breast cancer Cousin        dx >50   Cancer Cousin        type unk   Cancer Cousin        type unk   Cancer Cousin        type unk  + see HPI  PE: LMP 01/24/2000  There is no height or weight on file to calculate BMI. Wt Readings from Last 3 Encounters:  07/18/21 167 lb (75.8 kg)  06/06/21 170 lb (77.1 kg)  06/02/21 167 lb (75.8 kg)   Constitutional: overweight, in NAD Eyes: no exophthalmos ENT: moist mucous membranes, no masses palpated in neck, no cervical lymphadenopathy Cardiovascular: RRR, No MRG, + mild B periAnkle edema Respiratory: CTA B Musculoskeletal: no deformities Skin: moist, warm, no rashes Neurological: + tremor with outstretched hands  ASSESSMENT: 1. Post ablative hypothyroidism  2.   Multiple thyroid nodules - H/o Toxic MNG  PLAN:  1. 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  We treated her with RAI ablation in 01/2016, with subsequent normalization of TFTs, however, her TSH increased gradually and she ended up developing possibility of hypothyroidism.  We started levothyroxine in 10/2020, when the TSH returned at 7.39. - latest thyroid labs reviewed with pt. >> normal: Lab Results  Component Value Date   TSH 1.64 06/01/2021  - she continues on LT4 50 mcg daily - pt feels good on this dose. - we discussed about taking the thyroid hormone every day,  with water, >30 minutes before breakfast, separated by >4 hours from acid reflux medications, calcium, iron, multivitamins. Pt. is taking it correctly. - will check thyroid tests today: TSH and fT4 - If labs are abnormal, she will need to return for repeat TFTs in 1.5 months - OTW, I will see her in a year  2.  Multiple thyroid nodules -No neck compression symptoms -The thyroid size appears to have decreased after RAI treatment -She had a 1.1 cm left mid thyroid nodule, which required follow-up, and we checked a new ultrasound on 07/04/2021.  The nodule appears to be approximately stable with no follow-up needed. -We will continue to monitor her clinically for this, but no further ultrasounds are needed  [name], MD PhD [facility] Endocrinology

## 2021-12-01 ENCOUNTER — Other Ambulatory Visit: Payer: Self-pay | Admitting: *Deleted

## 2021-12-01 DIAGNOSIS — Z79811 Long term (current) use of aromatase inhibitors: Secondary | ICD-10-CM

## 2021-12-01 DIAGNOSIS — E559 Vitamin D deficiency, unspecified: Secondary | ICD-10-CM

## 2021-12-02 ENCOUNTER — Encounter: Payer: Self-pay | Admitting: Hematology and Oncology

## 2021-12-02 ENCOUNTER — Inpatient Hospital Stay: Payer: Medicare HMO | Attending: Hematology and Oncology

## 2021-12-02 ENCOUNTER — Inpatient Hospital Stay: Payer: Medicare HMO | Admitting: Hematology and Oncology

## 2021-12-02 ENCOUNTER — Other Ambulatory Visit: Payer: Self-pay

## 2021-12-02 ENCOUNTER — Telehealth: Payer: Self-pay | Admitting: Hematology and Oncology

## 2021-12-02 ENCOUNTER — Inpatient Hospital Stay: Payer: Medicare HMO

## 2021-12-02 VITALS — BP 136/69 | HR 70 | Temp 97.9°F | Resp 16 | Ht 69.0 in | Wt 157.9 lb

## 2021-12-02 DIAGNOSIS — M8580 Other specified disorders of bone density and structure, unspecified site: Secondary | ICD-10-CM | POA: Diagnosis not present

## 2021-12-02 DIAGNOSIS — Z79811 Long term (current) use of aromatase inhibitors: Secondary | ICD-10-CM | POA: Insufficient documentation

## 2021-12-02 DIAGNOSIS — D569 Thalassemia, unspecified: Secondary | ICD-10-CM | POA: Insufficient documentation

## 2021-12-02 DIAGNOSIS — C50311 Malignant neoplasm of lower-inner quadrant of right female breast: Secondary | ICD-10-CM | POA: Insufficient documentation

## 2021-12-02 DIAGNOSIS — D573 Sickle-cell trait: Secondary | ICD-10-CM | POA: Insufficient documentation

## 2021-12-02 DIAGNOSIS — E559 Vitamin D deficiency, unspecified: Secondary | ICD-10-CM | POA: Diagnosis not present

## 2021-12-02 DIAGNOSIS — Z79899 Other long term (current) drug therapy: Secondary | ICD-10-CM | POA: Insufficient documentation

## 2021-12-02 DIAGNOSIS — Z923 Personal history of irradiation: Secondary | ICD-10-CM | POA: Insufficient documentation

## 2021-12-02 DIAGNOSIS — Z170 Estrogen receptor positive status [ER+]: Secondary | ICD-10-CM | POA: Diagnosis not present

## 2021-12-02 LAB — CBC WITH DIFFERENTIAL (CANCER CENTER ONLY)
Abs Immature Granulocytes: 0.01 10*3/uL (ref 0.00–0.07)
Basophils Absolute: 0.1 10*3/uL (ref 0.0–0.1)
Basophils Relative: 2 %
Eosinophils Absolute: 0.3 10*3/uL (ref 0.0–0.5)
Eosinophils Relative: 5 %
HCT: 36 % (ref 36.0–46.0)
Hemoglobin: 11.8 g/dL — ABNORMAL LOW (ref 12.0–15.0)
Immature Granulocytes: 0 %
Lymphocytes Relative: 30 %
Lymphs Abs: 1.5 10*3/uL (ref 0.7–4.0)
MCH: 22.7 pg — ABNORMAL LOW (ref 26.0–34.0)
MCHC: 32.8 g/dL (ref 30.0–36.0)
MCV: 69.2 fL — ABNORMAL LOW (ref 80.0–100.0)
Monocytes Absolute: 0.6 10*3/uL (ref 0.1–1.0)
Monocytes Relative: 11 %
Neutro Abs: 2.6 10*3/uL (ref 1.7–7.7)
Neutrophils Relative %: 52 %
Platelet Count: 210 10*3/uL (ref 150–400)
RBC: 5.2 MIL/uL — ABNORMAL HIGH (ref 3.87–5.11)
RDW: 15.6 % — ABNORMAL HIGH (ref 11.5–15.5)
WBC Count: 5.1 10*3/uL (ref 4.0–10.5)
nRBC: 0 % (ref 0.0–0.2)

## 2021-12-02 LAB — CMP (CANCER CENTER ONLY)
ALT: 12 U/L (ref 0–44)
AST: 18 U/L (ref 15–41)
Albumin: 4 g/dL (ref 3.5–5.0)
Alkaline Phosphatase: 90 U/L (ref 38–126)
Anion gap: 5 (ref 5–15)
BUN: 18 mg/dL (ref 8–23)
CO2: 28 mmol/L (ref 22–32)
Calcium: 9.4 mg/dL (ref 8.9–10.3)
Chloride: 106 mmol/L (ref 98–111)
Creatinine: 0.93 mg/dL (ref 0.44–1.00)
GFR, Estimated: >60 mL/min (ref >60)
Glucose, Bld: 85 mg/dL (ref 70–99)
Potassium: 4 mmol/L (ref 3.5–5.1)
Sodium: 139 mmol/L (ref 135–145)
Total Bilirubin: 0.4 mg/dL (ref 0.3–1.2)
Total Protein: 7.6 g/dL (ref 6.5–8.1)

## 2021-12-02 NOTE — Progress Notes (Signed)
[place]  [phone]) [phone] [phone]) [phone]     ID: Catherine Lowery DOB: 01/21/1946  MR#: [id number]  WPY#:[id number]  Patient Care Team: Pcp, No as PCP - General Catherine Pickett [name], MD as PCP - Cardiology (Cardiology) Lowery, Catherine Dad, MD (Inactive) as Consulting Physician (Oncology) Catherine Pray, MD as Consulting Physician (Radiation Oncology) Catherine Butters, MD as Attending Physician (Orthopedic Surgery) Catherine Luna, MD as Consulting Physician (General Surgery) Catherine Banister, MD as Attending Physician (Gastroenterology) Catherine Bombard, MD as Consulting Physician (Neurology) Catherine Emms, MD (Inactive) as Consulting Physician (Nephrology) Catherine Lowery, [facility] as Pharmacist (Pharmacist) OTHER MD:   CHIEF COMPLAINT: 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  She is here for follow-up on anastrozole.  Since last visit, no new health complaints except for mild hot flashes.  No new breast changes reported.  She had her last dental visit more than a year ago. She denies any new dental concerns but has about 4 teeth, dentures otherwise. Rest of the pertinent 10 point ROS reviewed and negative.  COVID 19 VACCINATION STATUS: Pfizer x4, last 10/2020   HISTORY OF CURRENT ILLNESS: From the original intake note:  Catherine Lowery had routine screening mammography on 02/26/2018 showing a possible abnormality in the right breast. She underwent unilateral right diagnostic mammography with tomography and right breast ultrasonography at [facility] on 03/07/2018 showing: Breast Density Category B. On mammography, there is a new 1.5 cm irregular architectural distortion with a spiculated margin in the right breast at 5 o'clock anterior depth. On ultrasound, there is a 2.7 cm  irregular solid mass in the right breast at 5 o'clock middle depth. This irregular mass displays posterior acoustic shadowing. No significant abnormalities were seen sonographically in the right axilla.  Accordingly on 03/18/2018 she proceeded to biopsy of the right breast area in question. The pathology from this procedure showed ([id number]): Invasive mammary carcinoma e-cadherin negative, grade II-III. 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 Prognostic indicators significant for: estrogen receptor, 95% positive and progesterone receptor, 95% positive, both with strong staining intensity. Proliferation marker Ki67 at 5%. HER2 negative by immunohistochemistry (1+).  On 04/10/2018 the patient underwent bilateral breast MRI with and without contrast showing a 2.3 cm spiculated enhancing mass associated with non-masslike enhancement, the total abnormal area measuring 4.6 cm.  There was no evidence of lymph node abnormality and the contralateral breast was benign appearing  The patient's subsequent history is as detailed below.   PAST MEDICAL HISTORY: Past Medical History:  Diagnosis Date   Cancer ([place]) 03/2018   right breast cancer   Chronic kidney disease    CKD   DJD (degenerative joint disease)    LEFT HIP   Family history of breast cancer    Family history of ovarian cancer    Family history of prostate cancer    Family history of prostate cancer    H/O migraine 07/01/2019   Hyperlipidemia    takes Simvasatin daily   Hypertension    takes Propranolol and HCTZ daioly   Hyperthyroidism    Pneumonia 10/2015   PONV (postoperative nausea and vomiting)    when ether used   Shortness of breath dyspnea    with ambulation   Tremors of nervous system    takes Primidone daily    PAST SURGICAL HISTORY: Past Surgical  History:  Procedure Laterality Date   BOne Spur     left heel   BREAST LUMPECTOMY WITH RADIOACTIVE SEED AND SENTINEL LYMPH NODE BIOPSY Right  05/07/2018   Procedure: RIGHT BREAST LUMPECTOMY WITH RADIOACTIVE SEED AND RIGHT  SENTINEL LYMPH NODE MAPPING;  Surgeon: Catherine Luna, MD;  Location: [place];  Service: General;  Laterality: Right;   COLONOSCOPY     JOINT REPLACEMENT Bilateral    knee   KNEE SURGERY     RE-EXCISION OF BREAST LUMPECTOMY Right 06/05/2018   Procedure: RE-EXCISION OF RIGHT BREAST LUMPECTOMY;  Surgeon: Catherine Luna, MD;  Location: [place];  Service: General;  Laterality: Right;   ROTATOR CUFF REPAIR Bilateral    TOTAL HIP ARTHROPLASTY Left 12/29/2015   TOTAL HIP ARTHROPLASTY Left 12/29/2015   Procedure: TOTAL HIP ARTHROPLASTY ANTERIOR APPROACH;  Surgeon: Ninetta Lights, MD;  Location: [place];  Service: Orthopedics;  Laterality: Left;    FAMILY HISTORY Family History  Problem Relation Age of Onset   Hypertension Mother    Heart disease Mother    Stroke Mother    Multiple myeloma Father 24   Thyroid cancer Sister        dx 40's/50's   Cancer Paternal Aunt        unknown   Prostate cancer Paternal Uncle        dx >50- cancer was the cause of his death   Ovarian cancer Paternal Aunt    Throat cancer Paternal Uncle    Breast cancer Cousin        dx >50   Cancer Cousin        type unk   Cancer Cousin        type unk   Cancer Cousin        type unk  She notes that her father died from a multiple myeloma at age 62. Patients' mother died from stroke at age 87. The patient has 1 brother and 2 sisters. One paternal aunt had ovarian cancer.  One cousin had breast cancer, and her 82s. Another paternal aunt had cancer, type unknown to the patient, and a paternal uncle had prostate cancer.    GYNECOLOGIC HISTORY:  Menarche: 76 years old Age at first live birth: 76 years old GX P: 1 LMP: Patient's last menstrual period was 01/24/2000. 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 Before that, she has been employed at [employer], [employer], and as an elderly caregiver. 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 She is separated, and has not spoken to her husband, Lenard Lance, in over 10 years. She lives with her only son, Maggie Senseney, who is disabled secondary to SCD and multiple surgeries. She has no pets. She attends [employer].    ADVANCED DIRECTIVES: Her sister, Adria Dill, is [place]'s medical power of attorney. Barbaraann Share can be reached at [phone]. Cristo has stated that her husband, Lenard Lance, is not to have access to her medical records or to make decisions on her behalf. ]   HEALTH MAINTENANCE: Social History   Tobacco Use   Smoking status: Every Day    Packs/day: 0.50    Years: 20.00    Total pack years: 10.00    Types: Cigarettes   Smokeless tobacco:  Never   Tobacco comments:    06/06/16 Pt stated "I'm trying to quit"  Substance Use Topics   Alcohol use: No    Alcohol/week: 0.0 standard drinks of alcohol   Drug use: No     Colonoscopy: 01/2015  PAP:   Bone density:  02/26/2018: T-score 1.4   Allergies  Allergen Reactions   Morphine And Related Itching   Other Itching and Nausea And Vomiting    "PAIN MEDICINES"   Percocet [Oxycodone-Acetaminophen] Nausea And Vomiting    Current Outpatient Medications  Medication Sig Dispense Refill   amLODipine (NORVASC) 10 MG tablet Take 1 tablet (10 mg total) by mouth daily. 90 tablet 1   anastrozole (ARIMIDEX) 1 MG tablet Take 1 tablet (1 mg total) by mouth daily. 90 tablet 4   aspirin (ASPIRIN ADULT) 325 MG tablet enteric coated; may use OTC 30 tablet 0   atorvastatin (LIPITOR) 40 MG tablet Take 1 tablet (40 mg total) by mouth daily. 90 tablet 1   Cholecalciferol (VITAMIN D3) 50 MCG (2000 UT) capsule Take 1 capsule (2,000 Units total) by mouth daily. 90 capsule 1    levothyroxine (SYNTHROID) 50 MCG tablet Take 1 tablet (50 mcg total) by mouth daily. 90 tablet 3   Omega-3 Fatty Acids (FISH OIL PO) Take 1,000 mg by mouth daily.     primidone (MYSOLINE) 50 MG tablet Take 2 tablets (100 mg total) by mouth every morning. 180 tablet 2   propranolol (INDERAL) 40 MG tablet Take 0.5 tablets (20 mg total) by mouth 2 (two) times daily. 90 tablet 1   VOLTAREN 1 % GEL APPLY TOPICALLY 4 TIMES DAILY AS NEEDED 100 g 0   No current facility-administered medications for this visit.    OBJECTIVE: African-American woman in no acute distress  Vitals:   12/02/21 0945  BP: 136/69  Pulse: 70  Resp: 16  Temp: 97.9 F (36.6 C)  SpO2: 99%    Wt Readings from Last 3 Encounters:  12/02/21 157 lb 14.4 oz (71.6 kg)  07/18/21 167 lb (75.8 kg)  06/06/21 170 lb (77.1 kg)   Body mass index is 23.32 kg/m.    ECOG FS:1 - Symptomatic but completely ambulatory   Physical Exam Constitutional:      Appearance: Normal appearance.  Chest:     Comments: Bilateral breasts inspected.  Right breast status postlumpectomy.  No palpable masses or regional adenopathy.  Left breast normal to inspection and palpation.  No regional adenopathy. Musculoskeletal:     Cervical back: Normal range of motion. No rigidity.  Lymphadenopathy:     Cervical: No cervical adenopathy.  Neurological:     Mental Status: She is alert.       LAB RESULTS:  CMP     Component Value Date/Time   NA 139 12/02/2021 0859   NA 139 11/22/2020 0000   K 4.0 12/02/2021 0859   CL 106 12/02/2021 0859   CO2 28 12/02/2021 0859   GLUCOSE 85 12/02/2021 0859   BUN 18 12/02/2021 0859   CREATININE 0.93 12/02/2021 0859   CREATININE 1.05 (H) 05/19/2019 1034   CALCIUM 9.4 12/02/2021 0859   PROT 7.6 12/02/2021 0859   ALBUMIN 4.0 12/02/2021 0859   AST 18 12/02/2021 0859   ALT 12 12/02/2021 0859   ALKPHOS 90 12/02/2021 0859   BILITOT 0.4 12/02/2021 0859   GFRNONAA >60 12/02/2021 0859   GFRNONAA 48 (L)  01/23/2018 0804   GFRAA >60 06/23/2019 1524   GFRAA 56 (L) 01/23/2018 8406  No results found for: "TOTALPROTELP", "ALBUMINELP", "A1GS", "A2GS", "BETS", "BETA2SER", "GAMS", "MSPIKE", "SPEI"  No results found for: "KPAFRELGTCHN", "LAMBDASER", "KAPLAMBRATIO"  Lab Results  Component Value Date   WBC 5.1 12/02/2021   NEUTROABS 2.6 12/02/2021   HGB 11.8 (L) 12/02/2021   HCT 36.0 12/02/2021   MCV 69.2 (L) 12/02/2021   PLT 210 12/02/2021   No results found for: "LABCA2"  No components found for: "LUNGBM184"  No results for input(s): "INR" in the last 168 hours.  No results found for: "LABCA2"  No results found for: "QTT276"  No results found for: "CAN125"  No results found for: "CAN153"  No results found for: "CA2729"  No components found for: "HGQUANT"  No results found for: "CEA1", "CEA" / No results found for: "CEA1", "CEA"   No results found for: "AFPTUMOR"  No results found for: "CHROMOGRNA"  Lab Results  Component Value Date   HGBA 70.8 (L) 06/23/2019   HGBA2QUANT 3.6 (H) 06/23/2019   HGBFQUANT 0.0 06/23/2019   HGBSQUAN 25.6 (H) 06/23/2019   (Hemoglobinopathy evaluation)   Lab Results  Component Value Date   LDH 147 06/23/2019    Lab Results  Component Value Date   IRON 82 12/02/2020   TIBC 267 12/02/2020   IRONPCTSAT 31 12/02/2020   (Iron and TIBC)  Lab Results  Component Value Date   FERRITIN 161 12/02/2020    Urinalysis    Component Value Date/Time   COLORURINE YELLOW 09/21/2017 1533   APPEARANCEUR CLOUDY (A) 09/21/2017 1533   LABSPEC 1.015 09/21/2017 1533   PHURINE 5.5 09/21/2017 1533   GLUCOSEU NEGATIVE 09/21/2017 1533   HGBUR TRACE (A) 09/21/2017 1533   BILIRUBINUR SMALL (A) 03/24/2011 [place] 09/21/2017 1533   PROTEINUR NEGATIVE 09/21/2017 1533   UROBILINOGEN 0.2 03/24/2011 1054   NITRITE NEGATIVE 09/21/2017 1533   LEUKOCYTESUR TRACE (A) 09/21/2017 1533    STUDIES:  No results found.   ELIGIBLE FOR  AVAILABLE RESEARCH PROTOCOL: no   ASSESSMENT: 76 y.o. [employer], [place] woman status post right breast lower inner quadrant biopsy 03/18/2018 for a clinical T2N0, [name], grade 2 or 3, estrogen and progesterone receptor positive, [name], with an MIB-1 of 5%  (1) Genetic testing performed through [facility] Common Hereditary Cancers Panel + Thyroid Cancer Panel on 05/09/2018 showing no deleterious mutations APC, [name], [name], [name], [name], [name], [name], [name], [name], [name], [name], [name], [name], [name], [name], [name], [name], [name], [name], [name], [name], KIT, MEN1, [name], [name], [name], [name], [name], [name], [name], [name], [name], [name], [name], [name], [name], POLE, [name], [name], RAD50, [name], [name], [name], [name], [name], [name], [name], [name], [name], [name], [name], [name], [name], [name], [name], VHL.   (a) Two variants of uncertain significance in the genes [name] c.1176C>T (Silent) and [name] c.1419+4C>T (Intronic) were identified.  (2) right lumpectomy and sentinel lymph node sampling 05/07/2018 showed a pT2 pN1, [name], grade 2, with a positive inferior margin  (a) reexcision scheduled for 06/05/2018  (3) MammaPrint obtained from the definitive surgical sample read as "low risk" predicting a disease-free survival at 5 years of 97.8% without need of chemotherapy.  (4) adjuvant radiation 07/11/2018 - 08/27/2018  (a) 1. Right breast; [name]; 25 fractions of 1.8 Gy for a total of 45 Gy  3. Boost; 6 fractions of 2 Gy for a total of 12 Gy  (5) anastrozole started 10/02/2018  (a) bone density 05/05/2021, [name]  (6) genetic testing 05/18/2018 through the Common Hereditary Cancers Panel + Thyroid [place]er panel showed no deleterious mutations in APC, [name], [name], [name], [name], [name], [name], [name], [name], [name],  [name], [name], [name], [name], [name], [name], [name], [name], [name], [name], [name], KIT, MEN1, [name], [name], [name], [name], [name], [name], [name], [name], [name], [name], [name], [name], [name], POLE, [name], [name], RAD50, [name], [name], [name], [name], [name], [name], [name], [name], [name], [name], [name], [name], [name], [name], [name], VHL.  (a) 2 variants of uncertain significance in the genes [name] c.1176C>T (Silent) and [name] c.1419+4C>T (Intronic) were identified.    (b) UPDATE: The [name] c.1419+4C>T (Intronic) VUS has been reclassified to "Likely Benign." The report date is 08/08/2018.  (7) the patient has thalassemia/sickle cell trait: Hemoglobin electrophoresis 06/23/2019 shows hemoglobin 87.8%, [name], hemoglobin A2 elevated at 3.6%   PLAN:  Ms. Mazell is here for a follow-up on Arimidex.  She has been tolerating this well except for hot flashes.  Last mammogram in January 2023 with no evidence of malignancy, she is already scheduled for mammogram in January 2024 at [facility] according to her.  Last bone density showed a T score of -1.9 consistent with osteopenia.  She is already on vitamin D supplementation.  We have once again discussed her marked weightbearing exercises.  In the past we have talked about Zometa every 6 months for total of 3 years since she is on concomitant aromatase inhibitors and since she had osteopenia.  During her last visit, she mentioned only dentures.  Today she mentions that she has 4 teeth.  We have hence discussed about dental clearance before proceeding with bisphosphonates.  They are once again discussed about risks of hypocalcemia and osteonecrosis of the jaw with this medications. We will postpone Zometa which has been scheduled for today for later visit once we have dental clearance.  She will continue Zometa every 6 months for a total of 3 years.  We also discussed about considering anastrozole for 7 years given her histology in the past but we have not talked about  this today.   Return to clinic in 1 year or sooner as needed.  No concerning findings on physical examination.  Total time spent: 30 minutes.  *Total Encounter Time as defined by the Centers for Medicare and Medicaid Services includes, in addition to the face-to-face time of a patient visit (documented in the note above) non-face-to-face time: obtaining and reviewing outside history, ordering and reviewing medications, tests or procedures, care coordination (communications with other health care professionals or caregivers) and documentation in the medical record.

## 2021-12-02 NOTE — Telephone Encounter (Signed)
Scheduled appointment per 8/4 los. Unable to scheduled August 2024 Zometa due to infusion template not being available out that far. Patient stated she will make August 2024 appointment for infusion in March 2024 when she comes for her lab/infusion appointment.

## 2021-12-08 ENCOUNTER — Other Ambulatory Visit: Payer: Self-pay | Admitting: Nurse Practitioner

## 2021-12-08 DIAGNOSIS — I10 Essential (primary) hypertension: Secondary | ICD-10-CM

## 2021-12-14 ENCOUNTER — Telehealth: Payer: Self-pay | Admitting: *Deleted

## 2021-12-14 NOTE — Telephone Encounter (Signed)
Received signed release from Dr Gwendolyn Fill DDS stating pt does note receive regular dental care with last full exam on 01/06/2020. " Numerous extractions performed on 06/22/2020 , patient has edentulous maxillary arch- complete denture and mandibular partial denture four teeth remaining"   He did not answer (left blank) if the patients overall dental health allow for pt to proceed with treatment (bisphosphonate).  Letter given to MD for review and forward to pharmacist Teldrin James   Pt is scheduled Friday for zometa infusion.

## 2021-12-15 NOTE — Progress Notes (Signed)
Hold off on Zometa until Dental clearance received by MD Iruku.   Larene Beach, PharmD

## 2021-12-19 ENCOUNTER — Other Ambulatory Visit: Payer: Self-pay | Admitting: Registered Nurse

## 2021-12-19 DIAGNOSIS — F17210 Nicotine dependence, cigarettes, uncomplicated: Secondary | ICD-10-CM

## 2021-12-20 ENCOUNTER — Other Ambulatory Visit: Payer: Self-pay | Admitting: Hematology and Oncology

## 2021-12-20 NOTE — Progress Notes (Signed)
We were unable to obtain dental clearance hence at this time I recommended patient to continue supportive care without bisphosphonates.  In basket message sent to our scheduler to cancel appointments and plan has been discontinued.

## 2021-12-22 ENCOUNTER — Telehealth: Payer: Self-pay | Admitting: Surgery

## 2021-12-22 NOTE — Telephone Encounter (Signed)
I called the pt let her know that her dentist did not give approval for her to receive the IV medication Zometa, which helps with her bones. Therefore, our office is canceling her appointments on September 1, since the pt was to receive Zometa that day.  The pt verbalized understanding of these changes, and she was told to call the office back if she had any questions or concerns.

## 2021-12-30 ENCOUNTER — Ambulatory Visit: Payer: Medicare HMO

## 2021-12-30 ENCOUNTER — Other Ambulatory Visit: Payer: Medicare HMO

## 2022-01-12 ENCOUNTER — Ambulatory Visit
Admission: RE | Admit: 2022-01-12 | Discharge: 2022-01-12 | Disposition: A | Discharge status: Home / Self Care | Payer: Medicare HMO | Source: Ambulatory Visit | Attending: Registered Nurse | Admitting: Registered Nurse

## 2022-01-12 DIAGNOSIS — F17210 Nicotine dependence, cigarettes, uncomplicated: Secondary | ICD-10-CM

## 2022-02-01 ENCOUNTER — Telehealth: Payer: Self-pay | Admitting: Acute Care

## 2022-02-01 NOTE — Telephone Encounter (Signed)
This patient was screened by her hematologist. She has a history of breast cancer. Dr. Lamonte Sakai send me a message to get her scheduled with one of our providers. Can you see if any of the docs have an opening as a new consult. This would be for an abnormal lung cancer screening in a patient wit a history of breast cancer. I can see her if there are no docs with consult spots. Thanks  I have called the patient and made sure that she knows the results of her scan and that the plan is for a 3 month follow up, and appointment with one of the pulmonary docs before. She is I agreement with this plan.   Langley Gauss, can we get this patient scheduled with one of our docs or me in a consult slot? Thanks so much.

## 2022-02-06 ENCOUNTER — Telehealth: Payer: Self-pay | Admitting: Pharmacist

## 2022-02-06 NOTE — Progress Notes (Signed)
    Chronic Care Management Pharmacy Assistant   Name: Catherine Lowery  MRN: [id number] DOB: 1946/04/22   Reason for Encounter: Disease State - Hypertension Call      Recent office visits:  None noted.   Recent consult visits:  12/02/21 [name], MD - Breast Cancer - Oncology - Zometa postponed awaiting dental clearance. Follow up as scheduled.   Hospital visits:  None in previous 6 months  Medications: Outpatient Encounter Medications as of 02/06/2022  Medication Sig   amLODipine (NORVASC) 10 MG tablet Take 1 tablet (10 mg total) by mouth daily.   anastrozole (ARIMIDEX) 1 MG tablet Take 1 tablet (1 mg total) by mouth daily.   aspirin (ASPIRIN ADULT) 325 MG tablet enteric coated; may use OTC   atorvastatin (LIPITOR) 40 MG tablet Take 1 tablet (40 mg total) by mouth daily.   Cholecalciferol (VITAMIN D3) 50 MCG (2000 UT) capsule Take 1 capsule (2,000 Units total) by mouth daily.   levothyroxine (SYNTHROID) 50 MCG tablet Take 1 tablet (50 mcg total) by mouth daily.   Omega-3 Fatty Acids (FISH OIL PO) Take 1,000 mg by mouth daily.   primidone (MYSOLINE) 50 MG tablet Take 2 tablets (100 mg total) by mouth every morning.   propranolol (INDERAL) 40 MG tablet Take 0.5 tablets (20 mg total) by mouth 2 (two) times daily.   VOLTAREN 1 % GEL APPLY TOPICALLY 4 TIMES DAILY AS NEEDED   No facility-administered encounter medications on file as of 02/06/2022.    Current antihypertensive regimen:  Amlodipine (NORVASC) 10 MG tablet  How often are you checking your Blood Pressure?  Patient reported checking blood pressures daily   Current home BP readings: 125/70    What recent interventions/DTPs have been made by any provider to improve Blood Pressure control since last CPP Visit:  Patient denied and recent changes in medications since last visit with CPP   Any recent hospitalizations or ED visits since last visit with CPP? Patient has not had any hospitalizations and ED visits  since last visit with CPP   What diet changes have been made to improve Blood Pressure Control?  Patient reported limiting her sodium intake in her diet.    What exercise is being done to improve your Blood Pressure Control?   Patient reported she remains as active as she can.   Adherence Review: Is the patient currently on ACE/ARB medication? No Does the patient have >5 day gap between last estimated fill dates? No      Care Gaps   AWV: overdue Colonoscopy: done 02/26/15 DM Eye Exam: N/A DM Foot Exam: N/A Microalbumin: N/A HbgAIC: done 12/02/20 (5.4) DEXA: done 05/05/21 Mammogram: done 05/05/21     Star Rating Drugs: Atorvastatin 40 mg - last filled 12/19/21 90 days    Future Appointments  Date Time Provider [place]  02/13/2022 10:00 AM Icard, Octavio Graves, DO [facility] None  02/17/2022 11:40 AM [name], MD [facility] None  12/29/2022  9:00 AM [facility] None  12/29/2022  9:30 AM [name], MD [facility] None    Jobe Gibbon, [facility] Clinical Pharmacist Assistant  [phone]

## 2022-02-13 ENCOUNTER — Ambulatory Visit: Payer: Medicare HMO | Admitting: Pulmonary Disease

## 2022-02-13 ENCOUNTER — Encounter: Payer: Self-pay | Admitting: Pulmonary Disease

## 2022-02-13 VITALS — BP 140/68 | HR 62 | Ht 69.0 in | Wt 159.0 lb

## 2022-02-13 DIAGNOSIS — R911 Solitary pulmonary nodule: Secondary | ICD-10-CM

## 2022-02-13 DIAGNOSIS — J432 Centrilobular emphysema: Secondary | ICD-10-CM

## 2022-02-13 DIAGNOSIS — Z227 Latent tuberculosis: Secondary | ICD-10-CM

## 2022-02-13 NOTE — Patient Instructions (Addendum)
Thank you for visiting Dr. Valeta Harms at [facility]. Today we recommend the following:  Orders Placed This Encounter  Procedures   CT Chest Wo Contrast   QuantiFERON-TB Gold Plus   Return in about 3 months (around 05/16/2022) for with Eric Form, NP.    Please do your part to reduce the spread of COVID-19.

## 2022-02-13 NOTE — Progress Notes (Signed)
Synopsis: Referred in October 2023 for pulmonary nodule by No ref. provider found  Subjective:   PATIENT ID: Catherine Lowery GENDER: female DOB: [birth year], MRN: [id number]  Chief Complaint  Patient presents with   Consult    Xray results.     This is a 76 year old female, history of right breast cancer, hypertension.  Patient is a smoker started smoking at age 49.  Had a recent lung cancer screening CT with a right upper lobe peripheral cluster of nodules.  Recommended short-term CT follow-up.  Here today to discuss CT results and next steps.  Patient accompanied today in the office with her sister.  Patient was treated for latent TB when she was in the [employer] many years ago it sounds like she received 12 months of medication.  I presume this was INH.  She states that she was treated after having a positive skin test on the forearm.    Past Medical History:  Diagnosis Date   Cancer ([place]) 03/2018   right breast cancer   Chronic kidney disease    CKD   DJD (degenerative joint disease)    LEFT HIP   Family history of breast cancer    Family history of ovarian cancer    Family history of prostate cancer    Family history of prostate cancer    H/O migraine 07/01/2019   Hyperlipidemia    takes Simvasatin daily   Hypertension    takes Propranolol and HCTZ daioly   Hyperthyroidism    Pneumonia 10/2015   PONV (postoperative nausea and vomiting)    when ether used   Shortness of breath dyspnea    with ambulation   Tremors of nervous system    takes Primidone daily     Family History  Problem Relation Age of Onset   Hypertension Mother    Heart disease Mother    Stroke Mother    Multiple myeloma Father 53   Thyroid cancer Sister        dx 49's/50's   Cancer Paternal Aunt        unknown   Prostate cancer Paternal Uncle        dx >50- cancer was the cause of his death   Ovarian cancer Paternal Aunt    Throat cancer Paternal Uncle    Breast cancer Cousin        dx >50    Cancer Cousin        type unk   Cancer Cousin        type unk   Cancer Cousin        type unk     Past Surgical History:  Procedure Laterality Date   BOne Spur     left heel   BREAST LUMPECTOMY WITH RADIOACTIVE SEED AND SENTINEL LYMPH NODE BIOPSY Right 05/07/2018   Procedure: RIGHT BREAST LUMPECTOMY WITH RADIOACTIVE SEED AND RIGHT  SENTINEL LYMPH NODE MAPPING;  Surgeon: [name], MD;  Location: [facility];  Service: General;  Laterality: Right;   COLONOSCOPY     JOINT REPLACEMENT Bilateral    knee   KNEE SURGERY     RE-EXCISION OF BREAST LUMPECTOMY Right 06/05/2018   Procedure: RE-EXCISION OF RIGHT BREAST LUMPECTOMY;  Surgeon: [name], MD;  Location: [facility];  Service: General;  Laterality: Right;   ROTATOR CUFF REPAIR Bilateral    TOTAL HIP ARTHROPLASTY Left 12/29/2015   TOTAL HIP ARTHROPLASTY Left 12/29/2015   Procedure: TOTAL HIP ARTHROPLASTY ANTERIOR APPROACH;  Surgeon: Ninetta Lights, MD;  Location: [facility];  Service: Orthopedics;  Laterality: Left;    Social History   Socioeconomic History   Marital status: Single    Spouse name: Not on file   Number of children: 1   Years of education: 14   Highest education level: Not on file  Occupational History    Comment: retired  Tobacco Use   Smoking status: Every Day    Packs/day: 0.50    Years: 20.00    Total pack years: 10.00    Types: Cigarettes   Smokeless tobacco: Never   Tobacco comments:    02/13/22 Pt stated "I'm trying to quit" [name], CMA   Substance and Sexual Activity   Alcohol use: No    Alcohol/week: 0.0 standard drinks of alcohol   Drug use: No   Sexual activity: Not Currently    Birth control/protection: Post-menopausal  Other Topics Concern   Not on file  Social History Narrative   Lives with handicapped son   Caffeine- coffee, 2 cups daily, 1-2 sodas daily   Social Determinants of Health   Financial Resource Strain: Low Risk   (01/28/2020)   Overall Financial Resource Strain (CARDIA)    Difficulty of Paying Living Expenses: Not very hard  Food Insecurity: Not on file  Transportation Needs: Not on file  Physical Activity: Not on file  Stress: Not on file  Social Connections: Not on file  Intimate Partner Violence: Not on file     Allergies  Allergen Reactions   Morphine And Related Itching   Other Itching and Nausea And Vomiting    "PAIN MEDICINES"   Percocet [Oxycodone-Acetaminophen] Nausea And Vomiting     Outpatient Medications Prior to Visit  Medication Sig Dispense Refill   amLODipine (NORVASC) 10 MG tablet Take 1 tablet (10 mg total) by mouth daily. 90 tablet 1   anastrozole (ARIMIDEX) 1 MG tablet Take 1 tablet (1 mg total) by mouth daily. 90 tablet 4   aspirin (ASPIRIN ADULT) 325 MG tablet enteric coated; may use OTC 30 tablet 0   atorvastatin (LIPITOR) 40 MG tablet Take 1 tablet (40 mg total) by mouth daily. 90 tablet 1   Cholecalciferol (VITAMIN D3) 50 MCG (2000 UT) capsule Take 1 capsule (2,000 Units total) by mouth daily. 90 capsule 1   levothyroxine (SYNTHROID) 50 MCG tablet Take 1 tablet (50 mcg total) by mouth daily. 90 tablet 3   Omega-3 Fatty Acids (FISH OIL PO) Take 1,000 mg by mouth daily.     primidone (MYSOLINE) 50 MG tablet Take 2 tablets (100 mg total) by mouth every morning. 180 tablet 2   propranolol (INDERAL) 40 MG tablet Take 0.5 tablets (20 mg total) by mouth 2 (two) times daily. 90 tablet 1   VOLTAREN 1 % GEL APPLY TOPICALLY 4 TIMES DAILY AS NEEDED 100 g 0   No facility-administered medications prior to visit.    Review of Systems  Constitutional:  Negative for chills, fever, malaise/fatigue and weight loss.  HENT:  Negative for hearing loss, sore throat and tinnitus.   Eyes:  Negative for blurred vision and double vision.  Respiratory:  Negative for cough, hemoptysis, sputum production, wheezing and stridor.   Cardiovascular:  Negative for chest pain, palpitations,  orthopnea, leg swelling and PND.  Gastrointestinal:  Negative for abdominal pain, constipation, diarrhea, heartburn, nausea and vomiting.  Genitourinary:  Negative for dysuria, hematuria and urgency.  Musculoskeletal:  Negative for joint pain and myalgias.  Skin:  Negative  for itching and rash.  Neurological:  Negative for dizziness, tingling, weakness and headaches.  Endo/Heme/Allergies:  Negative for environmental allergies. Does not bruise/bleed easily.  Psychiatric/Behavioral:  Negative for depression. The patient is not nervous/anxious and does not have insomnia.   All other systems reviewed and are negative.    Objective:  Physical Exam Vitals reviewed.  Constitutional:      General: She is not in acute distress.    Appearance: She is well-developed.     Comments: Thin  HENT:     Head: Normocephalic and atraumatic.  Eyes:     General: No scleral icterus.    Conjunctiva/sclera: Conjunctivae normal.     Pupils: Pupils are equal, round, and reactive to light.  Neck:     Vascular: No JVD.     Trachea: No tracheal deviation.  Cardiovascular:     Rate and Rhythm: Normal rate and regular rhythm.     Heart sounds: No murmur heard. Pulmonary:     Effort: Pulmonary effort is normal. No tachypnea, accessory muscle usage or respiratory distress.     Breath sounds: No stridor. No wheezing, rhonchi or rales.     Comments: Diminished breath sounds bilaterally Abdominal:     General: Bowel sounds are normal. There is no distension.     Palpations: Abdomen is soft.     Tenderness: There is no abdominal tenderness.  Musculoskeletal:        General: No tenderness.     Cervical back: Neck supple.  Lymphadenopathy:     Cervical: No cervical adenopathy.  Skin:    General: Skin is warm and dry.     Capillary Refill: Capillary refill takes less than 2 seconds.     Findings: No rash.  Neurological:     Mental Status: She is alert and oriented to person, place, and time.  Psychiatric:         Behavior: Behavior normal.      Vitals:   02/13/22 1018  BP: (!) 140/68  Pulse: 62  SpO2: 99%  Weight: 159 lb (72.1 kg)  Height: _0  (1.753 m)   99% on RA BMI Readings from Last 3 Encounters:  02/13/22 23.48 kg/m  12/02/21 23.32 kg/m  07/18/21 24.63 kg/m   Wt Readings from Last 3 Encounters:  02/13/22 159 lb (72.1 kg)  12/02/21 157 lb 14.4 oz (71.6 kg)  07/18/21 167 lb (75.8 kg)     CBC    Component Value Date/Time   WBC 5.1 12/02/2021 0859   WBC 4.9 06/02/2021 0840   RBC 5.20 (H) 12/02/2021 0859   HGB 11.8 (L) 12/02/2021 0859   HCT 36.0 12/02/2021 0859   PLT 210 12/02/2021 0859   MCV 69.2 (L) 12/02/2021 0859   MCH 22.7 (L) 12/02/2021 0859   MCHC 32.8 12/02/2021 0859   RDW 15.6 (H) 12/02/2021 0859   LYMPHSABS 1.5 12/02/2021 0859   MONOABS 0.6 12/02/2021 0859   EOSABS 0.3 12/02/2021 0859   BASOSABS 0.1 12/02/2021 0859    Chest Imaging: 01/12/2022 lung cancer screening CT: Lung RADS 4, cluster right upper lobe nodules. The patient's images have been independently reviewed by me.    Pulmonary Functions Testing Results:     No data to display          FeNO:   Pathology:   Echocardiogram:   Heart Catheterization:     Assessment & Plan:     ICD-10-CM   1. Lung nodule  R91.1 CT Chest Wo Contrast  QuantiFERON-TB Gold Plus    2. TB lung, latent  Z22.7     3. Centrilobular emphysema ([place])  J43.2       Discussion:  This is a 76 year old female, abnormal lung cancer screening CT, centrilobular emphysema on CT.  History of latent TB treated with 12 months of medication, presumed INH when she was in the [employer].  Plan: Repeat noncontrasted CT chest in 3 months for her nodules. Check QuantiFERON gold, Likely benefit from having PFTs at some point. Was counseled on smoking cessation. Follow-up with SG, NP after CT chest in 3 months. If clustered area of nodules are still persistent may need to consider sputum cultures.      Current Outpatient Medications:    amLODipine (NORVASC) 10 MG tablet, Take 1 tablet (10 mg total) by mouth daily., Disp: 90 tablet, Rfl: 1   anastrozole (ARIMIDEX) 1 MG tablet, Take 1 tablet (1 mg total) by mouth daily., Disp: 90 tablet, Rfl: 4   aspirin (ASPIRIN ADULT) 325 MG tablet, enteric coated; may use OTC, Disp: 30 tablet, Rfl: 0   atorvastatin (LIPITOR) 40 MG tablet, Take 1 tablet (40 mg total) by mouth daily., Disp: 90 tablet, Rfl: 1   Cholecalciferol (VITAMIN D3) 50 MCG (2000 UT) capsule, Take 1 capsule (2,000 Units total) by mouth daily., Disp: 90 capsule, Rfl: 1   levothyroxine (SYNTHROID) 50 MCG tablet, Take 1 tablet (50 mcg total) by mouth daily., Disp: 90 tablet, Rfl: 3   Omega-3 Fatty Acids (FISH OIL PO), Take 1,000 mg by mouth daily., Disp: , Rfl:    primidone (MYSOLINE) 50 MG tablet, Take 2 tablets (100 mg total) by mouth every morning., Disp: 180 tablet, Rfl: 2   propranolol (INDERAL) 40 MG tablet, Take 0.5 tablets (20 mg total) by mouth 2 (two) times daily., Disp: 90 tablet, Rfl: 1   VOLTAREN 1 % GEL, APPLY TOPICALLY 4 TIMES DAILY AS NEEDED, Disp: 100 g, Rfl: 0    Garner Nash, DO [facility] 02/13/2022 10:46 AM

## 2022-02-17 ENCOUNTER — Ambulatory Visit: Payer: Medicare HMO | Admitting: Internal Medicine

## 2022-02-17 LAB — QUANTIFERON-TB GOLD PLUS
Mitogen-NIL: >10 IU/mL
NIL: 0.09 IU/mL
QuantiFERON-TB Gold Plus: NEGATIVE
TB1-NIL: 0.02 IU/mL
TB2-NIL: 0.01 IU/mL

## 2022-02-17 NOTE — Progress Notes (Deleted)
Patient ID: Catherine Lowery, female   DOB: July 05, 1945, 76 y.o.   MRN: [id number]  HPI  Catherine Lowery is a 76 y.o.-year-old female, initially referred by her cardiologist, Dr. Debara Pickett, returning for f/u for history of toxic MNG, with subclinical thyrotoxicosis, now s/p RAI tx. Last visit 8 months ago.  Interim hx: At this visit, she is feeling well, without complaints. She has a history of breast cancer.  She has a newly found right upper lobe cluster of nodule.  She also has a history of treated latent TB.  No shortness of breath.  Reviewed and addended history: Pt was found to have a low TSH at the time of a recent hospitalization for CP in 12/2014. She r/o for an AMI. She had a follow up visit with Dr Debara Pickett >> a TSH was repeated >> better, but still low.  Thyroid Uptake and scan (11/30/2015) report reviewed:  The 24 hour radioactive iodine uptake is equal to 21.7%.   Heterogeneous tracer activity is identified within both lobes of the thyroid gland. Relative areas of decreased uptake localizing to the upper pole of the left lobe, mid right lobe and upper pole of right lobe is noted.   IMPRESSION: 1. 24 hour radioactive iodine uptake is within normal limits at 21.7%. 2. Multi nodular thyroid gland.       We next obtained a thyroid U/S (12/09/2015) to investigate the upper "cold" thyroid nodules - however, these were small, while the dominant nodules appeared "hot" >> we did not have to biopsy them: Right thyroid lobe: 5.9 x 2.0 x 2.4 cm. Multiple nodules are scattered throughout the right lobe.  1.3 x 0.7 x 0.9 cm mid lobe solid nodule. 1.7 x 1.3 x 0.8 cm upper pole solid nodule.  1.9 x 0.9 x 1.2 cm lower pole nodule.  Other scattered smaller nodules are noted.  Left thyroid lobe: 5.6 x 1.5 x 2.0 cm. Multiple solid nodules are scattered throughout the left lobe.  1.0 x 1.1 x 1.4 cm mid lobe nodule.  2.1 x 0.9 x 1.2 cm lower pole nodule.  1.4 x 1.0 x 1.1 cm upper pole nodule.   Other smaller nodules are noted. Isthmus Thickness: 0.4 cm.  Small subcentimeter nodules are present. Lymphadenopathy: None visualized.  Pt had RAI tx (02/24/2016): 29.4 mCi   Thyroid U/S (11/26/2019): Parenchymal Echotexture: Moderately heterogenous Isthmus: 0.2 cm Right lobe: 4.6 cm x 1.2 cm x 1.7 cm Left lobe: 3.4 cm x 1.1 cm x 1.7 cm _________________________________________________________   Nodule labeled 1 right mid thyroid, decreased in size to 6 mm, indicative of benign behavior. TR 4 and does not meet criteria for surveillance or biopsy.   Nodule labeled 2 in the right mid thyroid, decreased to 7 mm indicative of benign behavior. This remains TR 4 with central macrocalcification. Nodule does not meet criteria for surveillance or biopsy.   Nodule labeled 3 in the right mid thyroid, decreased to 6 mm indicative of benign behavior. Spongiform appearance and does not meet criteria for surveillance or biopsy.   Nodule labeled 4 in the left mid thyroid, 1.1 cm, decreased from 1.4 cm, indicating benign behavior. TR 4 and meets criteria for surveillance.   Nodule labeled 5 inferior left thyroid, decreased to 7 mm indicative of benign behavior. This nodule is TR 2 and does not meet criteria for further surveillance or biopsy.   Nodule # 6: Location: Left; Inferior Maximum size: 0.7 cm; Other 2 dimensions: 0.5 cm x 0.5 cm Composition:  cannot determine (2) Echogenicity: hypoechoic (2) Nodule does not meet criteria for surveillance or biopsy  _________________________________________________________   No adenopathy   IMPRESSION: Multinodular thyroid.   Left mid thyroid nodule (labeled 4, TR 4, 1.1 cm) meets criteria for surveillance, as designated by the newly established ACR TI-RADS criteria. Surveillance ultrasound study recommended to be performed annually up to 5 years. Given the decreasing size over time, this is favored to represent a benign nodule, and 5  years of surveillance would span 12/09/2015-August of 2022.  Thyroid U/S (07/04/2021): Parenchymal Echotexture: Moderately heterogenous  Isthmus: 0.3 cm  Right lobe: 5.0 x 1.5 x 1.3 cm Left lobe: 4.7 x 1.5 x 1.7 cm _________________________________________________________   Nodule labeled 4 is a solid hypoechoic TR 4 nodule in the mid left thyroid lobe which measures 4.0 x 1.0 x 0.7 cm, previously measuring 1.1 cm in July 2021. There remains similar in size and appearance, and demonstrates 5 year stability.   Multiple other small subcentimeter nodules remain similar and do not meet criteria for further dedicated follow-up or biopsy.   IMPRESSION: Multinodular thyroid gland with a number of small and/or benign nodules as described. Specifically, nodule labeled 4 now demonstrates 5 year stability and is considered benign. There are no new nodules on today's exam that would necessitate further dedicated follow-up or biopsy.  Reviewing the images, the mid left thyroid lobe measures 1.08 cm, not 4 cm in the largest dimension.  I let radiology know and they corrected the report: ADDENDUM REPORT: 07/05/2021 13:55  Nodule 4 measurements should read 1.1 x 1.0 x 0.7 cm. It remains similar in size and appearance, and demonstrates 5 year stability.  She developed postoperative hypothyroidism.  We started levothyroxine 50 mcg daily in 10/2020.  She takes this: - at 1-2 am - fasting - at least 30 min from b'fast - no calcium - no iron - no multivitamins - no PPIs - not on Biotin besides a MVI taken inconsistently  Reviewed her TFTs: Lab Results  Component Value Date   TSH 1.64 06/01/2021   TSH 3.35 12/28/2020   TSH 7.39 (H) 11/09/2020   TSH 4.99 (H) 05/27/2020   TSH 4.56 (H) 11/21/2019   TSH 5.67 (H) 09/17/2019   TSH 2.87 09/11/2018   TSH 2.27 09/10/2017   TSH 2.43 03/13/2017   TSH 1.89 11/10/2016   FREET4 1.20 06/01/2021   FREET4 0.92 12/28/2020   FREET4 0.76 11/09/2020    FREET4 0.78 05/27/2020   FREET4 0.79 11/21/2019   FREET4 0.9 09/17/2019   FREET4 0.93 09/11/2018   FREET4 0.76 09/10/2017   FREET4 0.78 03/13/2017   FREET4 0.67 11/10/2016    Her Graves' antibodies were not elevated: Lab Results  Component Value Date   TSI 51 03/02/2015   She has chronic hereditary tremor.  She sees neurology (Dr. Amalia Hailey in [place]).  She is on propranolol.  Pt denies: - feeling nodules in neck - dysphagia - choking But she does have hoarseness from  smoking.  She has a family history of thyroid cancer in sister.  No FH of thyroid cancer. No h/o radiation tx to head or neck other than RAI treatment. No herbal supplements. No Biotin use. No recent steroids use.   She was diagnosed with breast cancer in 03/2018. She had partial mastectomy, then more extensive R breast sx. she had RxTx up to 07/2018.  She is on Arimidex-has hot flashes.  She takes a B12 vitamin.  ROS: + see HPI Neurological: + tremors/no numbness/no tingling/no  dizziness  I reviewed pt's medications, allergies, PMH, social hx, family hx, and changes were documented in the history of present illness. Otherwise, unchanged from my initial visit note.  Past Medical History:  Diagnosis Date   Cancer ([place]) 03/2018   right breast cancer   Chronic kidney disease    CKD   DJD (degenerative joint disease)    LEFT HIP   Family history of breast cancer    Family history of ovarian cancer    Family history of prostate cancer    Family history of prostate cancer    H/O migraine 07/01/2019   Hyperlipidemia    takes Simvasatin daily   Hypertension    takes Propranolol and HCTZ daioly   Hyperthyroidism    Pneumonia 10/2015   PONV (postoperative nausea and vomiting)    when ether used   Shortness of breath dyspnea    with ambulation   Tremors of nervous system    takes Primidone daily   Past Surgical History:  Procedure Laterality Date   BOne Spur     left heel   BREAST LUMPECTOMY  WITH RADIOACTIVE SEED AND SENTINEL LYMPH NODE BIOPSY Right 05/07/2018   Procedure: RIGHT BREAST LUMPECTOMY WITH RADIOACTIVE SEED AND RIGHT  SENTINEL LYMPH NODE MAPPING;  Surgeon: [name], MD;  Location: [facility];  Service: General;  Laterality: Right;   COLONOSCOPY     JOINT REPLACEMENT Bilateral    knee   KNEE SURGERY     RE-EXCISION OF BREAST LUMPECTOMY Right 06/05/2018   Procedure: RE-EXCISION OF RIGHT BREAST LUMPECTOMY;  Surgeon: [name], MD;  Location: [facility];  Service: General;  Laterality: Right;   ROTATOR CUFF REPAIR Bilateral    TOTAL HIP ARTHROPLASTY Left 12/29/2015   TOTAL HIP ARTHROPLASTY Left 12/29/2015   Procedure: TOTAL HIP ARTHROPLASTY ANTERIOR APPROACH;  Surgeon: Ninetta Lights, MD;  Location: [facility];  Service: Orthopedics;  Laterality: Left;   Social History   Social History   Marital Status: Married    Spouse Name: N/A   Number of Children: 1   Occupational History   retired   Social History Main Topics   Smoking status: Current Every Day Smoker -- 0.50 packs/day for 20 years   Smokeless tobacco: Not on file   Alcohol Use: No   Drug Use: No   Current Outpatient Medications on File Prior to Visit  Medication Sig Dispense Refill   amLODipine (NORVASC) 10 MG tablet Take 1 tablet (10 mg total) by mouth daily. 90 tablet 1   anastrozole (ARIMIDEX) 1 MG tablet Take 1 tablet (1 mg total) by mouth daily. 90 tablet 4   aspirin (ASPIRIN ADULT) 325 MG tablet enteric coated; may use OTC 30 tablet 0   atorvastatin (LIPITOR) 40 MG tablet Take 1 tablet (40 mg total) by mouth daily. 90 tablet 1   Cholecalciferol (VITAMIN D3) 50 MCG (2000 UT) capsule Take 1 capsule (2,000 Units total) by mouth daily. 90 capsule 1   levothyroxine (SYNTHROID) 50 MCG tablet Take 1 tablet (50 mcg total) by mouth daily. 90 tablet 3   Omega-3 Fatty Acids (FISH OIL PO) Take 1,000 mg by mouth daily.     primidone (MYSOLINE) 50 MG tablet Take 2 tablets (100  mg total) by mouth every morning. 180 tablet 2   propranolol (INDERAL) 40 MG tablet Take 0.5 tablets (20 mg total) by mouth 2 (two) times daily. 90 tablet 1   VOLTAREN 1 % GEL APPLY TOPICALLY 4 TIMES DAILY  AS NEEDED 100 g 0   No current facility-administered medications on file prior to visit.   Allergies  Allergen Reactions   Morphine And Related Itching   Other Itching and Nausea And Vomiting    "PAIN MEDICINES"   Percocet [Oxycodone-Acetaminophen] Nausea And Vomiting   Family History  Problem Relation Age of Onset   Hypertension Mother    Heart disease Mother    Stroke Mother    Multiple myeloma Father 96   Thyroid cancer Sister        dx 41's/50's   Cancer Paternal Aunt        unknown   Prostate cancer Paternal Uncle        dx >50- cancer was the cause of his death   Ovarian cancer Paternal Aunt    Throat cancer Paternal Uncle    Breast cancer Cousin        dx >50   Cancer Cousin        type unk   Cancer Cousin        type unk   Cancer Cousin        type unk  + see HPI  PE: LMP 01/24/2000  There is no height or weight on file to calculate BMI. Wt Readings from Last 3 Encounters:  02/13/22 159 lb (72.1 kg)  12/02/21 157 lb 14.4 oz (71.6 kg)  07/18/21 167 lb (75.8 kg)   Constitutional: Slightly overweight, in NAD Eyes: EOMI, no exophthalmos ENT: no thyromegaly, no cervical lymphadenopathy Cardiovascular: RRR, No MRG, + mild periankle edema B Respiratory: CTA B Musculoskeletal: no deformities Skin: no rashes Neurological: + tremor with outstretched hands  ASSESSMENT: 1.  Multiple thyroid nodules - H/o Toxic MNG  2. Subclinical hypothyroidism  PLAN:  1. 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  We treated her with RAI ablation in 01/2016, with subsequent normalization of her TFTs, however, she developed post ablative hypothyroidism -We started levothyroxine 10/2020.  She requires a  low dose. - latest thyroid labs reviewed with pt. >> normal: Lab Results  Component Value Date   TSH 1.64 06/01/2021  - she continues on LT4 50 mcg daily - pt feels good on this dose. - we discussed about taking the thyroid hormone every day, with water, >30 minutes before breakfast, separated by >4 hours from acid reflux medications, calcium, iron, multivitamins. Pt. is taking it correctly now.  At last visit, she was taking it in the middle of the night, as she thought she needed to separated by 4 hours from all her other medicines.  We moved this to the morning and I advised her to move the multivitamin at lunchtime or later. - will check thyroid tests today: TSH and fT4 - If labs are abnormal, she will need to return for repeat TFTs in 1.5 months - I we will see her back in 1 year  2.  Multiple thyroid nodules -Neck compression symptoms -Right size appears to have decreased after RAI treatment -In 2021, we checked a thyroid ultrasound that showed that the left mid nodule, measuring 1.1 cm required follow-up in a year.  The rest of the nodules were not worrisome -After last visit, we checked another thyroid ultrasound that showed stability of the left main nodule.  No further follow-up was needed -We will continue to follow her expectantly.  [name], MD PhD [facility]

## 2022-02-20 NOTE — Progress Notes (Signed)
Catherine Lowery, please let patient know that her QuantiFERON is negative.  She will just need her follow-up CT scan planned in January.  Thanks,  BLI  Garner Nash, DO [facility] 02/20/2022 4:46 PM

## 2022-03-20 NOTE — Progress Notes (Signed)
Left msg on machine with results ok per [facility]

## 2022-05-10 ENCOUNTER — Other Ambulatory Visit: Payer: Self-pay | Admitting: Internal Medicine

## 2022-05-10 ENCOUNTER — Emergency Department (HOSPITAL_BASED_OUTPATIENT_CLINIC_OR_DEPARTMENT_OTHER)
Admission: EM | Admit: 2022-05-10 | Discharge: 2022-05-10 | Disposition: A | Discharge status: Home / Self Care | Payer: Medicare HMO | Attending: Emergency Medicine | Admitting: Emergency Medicine

## 2022-05-10 ENCOUNTER — Other Ambulatory Visit: Payer: Self-pay

## 2022-05-10 ENCOUNTER — Emergency Department (HOSPITAL_BASED_OUTPATIENT_CLINIC_OR_DEPARTMENT_OTHER): Payer: Medicare HMO

## 2022-05-10 ENCOUNTER — Encounter (HOSPITAL_BASED_OUTPATIENT_CLINIC_OR_DEPARTMENT_OTHER): Payer: Self-pay

## 2022-05-10 DIAGNOSIS — W108XXA Fall (on) (from) other stairs and steps, initial encounter: Secondary | ICD-10-CM | POA: Insufficient documentation

## 2022-05-10 DIAGNOSIS — E038 Other specified hypothyroidism: Secondary | ICD-10-CM

## 2022-05-10 DIAGNOSIS — S0083XA Contusion of other part of head, initial encounter: Secondary | ICD-10-CM | POA: Diagnosis present

## 2022-05-10 NOTE — Discharge Instructions (Signed)
Take over-the-counter medications as needed for pain.  Apply the ice pack to help with swelling and discomfort.  Return to the ER if you start having trouble with severe headache, vomiting, confusion

## 2022-05-10 NOTE — ED Provider Notes (Signed)
[facility] Provider Note   CSN: [id number] Arrival date & time: 05/10/22  1509     History  Chief Complaint  Patient presents with   Catherine Lowery    Catherine Lowery is a 77 y.o. female.   Fall     Patient presents to the ED for evaluation of a head injury.  Patient states she was walking up the steps when she feels like her hip Konner gave way.  She was carrying a lot of items.  She ended up falling forward and striking her left forehead on the steps.  Patient developed hematoma.  She does not have any loss of consciousness.  She is not having any neck pain.  Does have a headache.  Patient does take aspirin but is not on any Xarelto or Coumadin or Eliquis  Home Medications Prior to Admission medications   Medication Sig Start Date End Date Taking? Authorizing Provider  amLODipine (NORVASC) 10 MG tablet Take 1 tablet (10 mg total) by mouth daily. 06/06/21   [name], NP  anastrozole (ARIMIDEX) 1 MG tablet Take 1 tablet (1 mg total) by mouth daily. 06/02/21   [name], MD  aspirin (ASPIRIN ADULT) 325 MG tablet enteric coated; may use OTC 07/01/19   Ishmael Holter A, FNP  atorvastatin (LIPITOR) 40 MG tablet Take 1 tablet (40 mg total) by mouth daily. 06/06/21   [name], NP  Cholecalciferol (VITAMIN D3) 50 MCG (2000 UT) capsule Take 1 capsule (2,000 Units total) by mouth daily. 03/04/21   [name], NP  levothyroxine (SYNTHROID) 50 MCG tablet Take 1 tablet (50 mcg total) by mouth daily. 06/01/21   [name], MD  Omega-3 Fatty Acids (FISH OIL PO) Take 1,000 mg by mouth daily.    [provider]  primidone (MYSOLINE) 50 MG tablet Take 2 tablets (100 mg total) by mouth every morning. 06/06/21   [name], NP  propranolol (INDERAL) 40 MG tablet Take 0.5 tablets (20 mg total) by mouth 2 (two) times daily. 06/06/21   [name], NP  VOLTAREN 1 % GEL APPLY TOPICALLY 4 TIMES DAILY AS NEEDED 10/16/16   Alycia Rossetti, MD      Allergies    Morphine and related, Other, and Percocet [oxycodone-acetaminophen]    Review of Systems   Review of Systems  Physical Exam Updated Vital Signs BP (!) 149/64 (BP Location: Right Arm)   Pulse (!) 52   Temp 97.7 F (36.5 C) (Temporal)   Resp 18   Ht 1.753 m ('5\' 9"'$ )   Wt 72.1 kg   LMP 01/24/2000   SpO2 99%   BMI 23.47 kg/m  Physical Exam Vitals and nursing note reviewed.  Constitutional:      General: She is not in acute distress.    Appearance: She is well-developed.  HENT:     Head: Normocephalic.     Comments: Hematoma left forehead    Right Ear: External ear normal.     Left Ear: External ear normal.  Eyes:     General: No scleral icterus.       Right eye: No discharge.        Left eye: No discharge.     Conjunctiva/sclera: Conjunctivae normal.  Neck:     Trachea: No tracheal deviation.  Cardiovascular:     Rate and Rhythm: Normal rate and regular rhythm.  Pulmonary:     Effort: Pulmonary effort is normal. No respiratory distress.  Breath sounds: Normal breath sounds. No stridor. No wheezing or rales.  Abdominal:     General: Bowel sounds are normal. There is no distension.     Palpations: Abdomen is soft.     Tenderness: There is no abdominal tenderness. There is no guarding or rebound.  Musculoskeletal:        General: No tenderness or deformity.     Cervical back: Neck supple.  Skin:    General: Skin is warm and dry.     Findings: No rash.  Neurological:     General: No focal deficit present.     Mental Status: She is alert.     Cranial Nerves: No cranial nerve deficit, dysarthria or facial asymmetry.     Sensory: No sensory deficit.     Motor: No abnormal muscle tone or seizure activity.     Coordination: Coordination normal.  Psychiatric:        Mood and Affect: Mood normal.     ED Results / Procedures / Treatments   Labs (all labs ordered are listed, but only abnormal results are displayed) Labs Reviewed -  No data to display  EKG None  Radiology CT Cervical Spine Wo Contrast  Result Date: 05/10/2022 CLINICAL DATA:  Trauma, fall EXAM: CT CERVICAL SPINE WITHOUT CONTRAST TECHNIQUE: Multidetector CT imaging of the cervical spine was performed without intravenous contrast. Multiplanar CT image reconstructions were also generated. RADIATION DOSE REDUCTION: This exam was performed according to the departmental dose-optimization program which includes automated exposure control, adjustment of the mA and/or kV according to patient size and/or use of iterative reconstruction technique. COMPARISON:  None Available. FINDINGS: Alignment: There is minimal anterolisthesis at C6-C7 level. Skull base and vertebrae: No recent fracture is seen. Degenerative changes are noted in cervical spine. Soft tissues and spinal canal: There is extrinsic pressure over the ventral margin of thecal sac caused by posterior bony spurs from C3-C6 levels. Disc levels: There is encroachment of neural foramina by bony spurs and facet hypertrophy from C2 to C6 levels. Upper chest: Minimal scarring is seen in the right apex. Other: None. IMPRESSION: No recent fracture is seen in cervical spine. Cervical spondylosis with encroachment of neural foramina from C2-C6 levels. Electronically Signed   By: Elmer Picker M.D.   On: 05/10/2022 16:13   CT Head Wo Contrast  Result Date: 05/10/2022 CLINICAL DATA:  Trauma, fall EXAM: CT HEAD WITHOUT CONTRAST TECHNIQUE: Contiguous axial images were obtained from the base of the skull through the vertex without intravenous contrast. RADIATION DOSE REDUCTION: This exam was performed according to the departmental dose-optimization program which includes automated exposure control, adjustment of the mA and/or kV according to patient size and/or use of iterative reconstruction technique. COMPARISON:  None Available. FINDINGS: Brain: No acute intracranial findings are seen. There are no signs of bleeding within  the cranium. Cortical sulci are prominent. There is decreased density in periventricular white matter. Vascular: Unremarkable. Skull: No fracture is seen in calvarium. There is subcutaneous hematoma in the left frontal scalp. Sinuses/Orbits: Possible small osteoma is seen in left ethmoid sinus. There is minimal mucosal thickening in ethmoid sinus. Other: None. IMPRESSION: No acute intracranial findings are seen. Atrophy. Small-vessel disease. There is subcutaneous hematoma in the left frontal scalp. No fracture is seen in calvarium. Electronically Signed   By: Elmer Picker M.D.   On: 05/10/2022 16:08    Procedures Procedures    Medications Ordered in ED Medications - No data to display  ED Course/ Medical Decision Making/  A&P                           Medical Decision Making Problems Addressed: Traumatic hematoma of forehead, initial encounter: acute illness or injury that poses a threat to life or bodily functions  Amount and/or Complexity of Data Reviewed Radiology: ordered and independent interpretation performed.    Details: No acute abnormalities   Patient presented ER for evaluation of a head injury.  Patient noted a large hematoma on her forehead.  She does take aspirin but no other anticoagulants.  Head CT performed.  No acute abnormality other than soft tissue injury.  Patient without new weakness.  No other injuries noted  Evaluation and diagnostic testing in the emergency department does not suggest an emergent condition requiring admission or immediate intervention beyond what has been performed at this time.  The patient is safe for discharge and has been instructed to return immediately for worsening symptoms, change in symptoms or any other concerns.        Final Clinical Impression(s) / ED Diagnoses Final diagnoses:  Traumatic hematoma of forehead, initial encounter    Rx / DC Orders ED Discharge Orders     None         Dorie Rank, MD 05/10/22  [phone]

## 2022-05-10 NOTE — ED Triage Notes (Signed)
Patient here POV from Home.  Endorses Falling today while going up the steps. Was walking when her Right Hip gave way causing her fall. No Hip Pain but endorses Pain and Bruising/Swelling to Head.  No Anticoagulants. No LOC. No Dizziness.   NAD Noted during Triage. A&Ox4. GCS 15. Ambulatory.

## 2022-05-10 NOTE — ED Notes (Signed)
Pt verbalized understanding of d/c instructions, meds, and followup care. Denies questions. VSS, no distress noted. Steady gait to exit with all belongings.  ?

## 2022-05-15 ENCOUNTER — Ambulatory Visit (HOSPITAL_COMMUNITY)
Admission: RE | Admit: 2022-05-15 | Discharge: 2022-05-15 | Disposition: A | Discharge status: Home / Self Care | Payer: Medicare HMO | Source: Ambulatory Visit | Attending: Pulmonary Disease | Admitting: Pulmonary Disease

## 2022-05-15 ENCOUNTER — Encounter (HOSPITAL_COMMUNITY): Payer: Self-pay

## 2022-05-15 DIAGNOSIS — R911 Solitary pulmonary nodule: Secondary | ICD-10-CM

## 2022-05-15 DIAGNOSIS — I700 Atherosclerosis of aorta: Secondary | ICD-10-CM | POA: Diagnosis not present

## 2022-05-15 DIAGNOSIS — N289 Disorder of kidney and ureter, unspecified: Secondary | ICD-10-CM | POA: Insufficient documentation

## 2022-05-15 DIAGNOSIS — I7122 Aneurysm of the aortic arch, without rupture: Secondary | ICD-10-CM | POA: Insufficient documentation

## 2022-05-15 NOTE — Progress Notes (Signed)
Judson Roch,  Patient is seeing you on the 24th.  Lung nodules look improved.  She may qualify for lung cancer screening however.  If so enroll her in the program.  She will need follow-up regarding her aortic aneurysm.  Thanks,  BLI  Garner Nash, DO [facility] 05/15/2022 2:31 PM

## 2022-05-24 ENCOUNTER — Encounter: Payer: Self-pay | Admitting: Acute Care

## 2022-05-24 ENCOUNTER — Ambulatory Visit: Payer: Medicare HMO | Admitting: Acute Care

## 2022-05-24 VITALS — BP 118/72 | HR 60 | Temp 98.1°F | Ht 69.0 in | Wt 157.6 lb

## 2022-05-24 DIAGNOSIS — F17210 Nicotine dependence, cigarettes, uncomplicated: Secondary | ICD-10-CM

## 2022-05-24 DIAGNOSIS — I719 Aortic aneurysm of unspecified site, without rupture: Secondary | ICD-10-CM | POA: Diagnosis not present

## 2022-05-24 DIAGNOSIS — R911 Solitary pulmonary nodule: Secondary | ICD-10-CM | POA: Diagnosis not present

## 2022-05-24 DIAGNOSIS — J209 Acute bronchitis, unspecified: Secondary | ICD-10-CM

## 2022-05-24 DIAGNOSIS — Z720 Tobacco use: Secondary | ICD-10-CM

## 2022-05-24 DIAGNOSIS — J440 Chronic obstructive pulmonary disease with acute lower respiratory infection: Secondary | ICD-10-CM | POA: Diagnosis not present

## 2022-05-24 NOTE — Progress Notes (Signed)
History of Present Illness Catherine Lowery is a 77 y.o. female with history of right breast cancer, and hypertension. Patient is a smoker started smoking at age 41, had a 20+ pack year smoking history. She Had a recent lung cancer screening 12/2021 CT with a right upper lobe peripheral cluster of nodules. Recommended short-term CT follow-up. She was seen by Dr. Valeta Harms 02/13/2022, and she is here to review her 3 month follow up CT Chest.    05/24/2022 Pt. Presents for follow up , and to review 3 month follow up CT Chest. CT Chest shows improvement .Nodule is smaller in size. We will do a 12 month follow up CT Chest to assess for stability. Because of her age she does not qualify for lung cancer screening. She is compliant with her Trelegy and albuterol as needed for  breakthrough shortness of breath or wheezing. Her COPD is managed by her PCP.  She has almost quit smoking. I have asked her to call 1-800-quit now for free nicotine patches, gum or mints. There was notation of a 3 cm aneurysm. We will re-evaluate on her 05/2023 CT Chest as follow up to lung nodules and aneurysm. ( Recommendation is for 3 year follow up, but as we are following lung nodule, it will be 12 months) We discussed the importance of good BP control. She verbalized understanding.   Test Results: 05/15/2022 CT Chest without Contrast Emphysematous scarring diffuse bronchiectatic changes consistent with chronic bronchitis. Consolidation or scarring stable in the right base and. Nodularity right upper lobe laterally which has diminished when compared to the prior study. The largest nodule is now about 4 mm. No new nodules or suspicious lesions identified.  IMPRESSION: 1. Right upper lobe nodularity has diminished compared to the previous examination. 2. Numerous foci of pleural/parenchymal changes consistent with scarring which are stable findings bilaterally. 3. Emphysematous scarring. 4. Atheromatous changes and focal  aneurysmal dilatation of the arch at the AP window which is stable measuring 3 cm in diameter. Typically 3 years CT follow up recommended. 5. Bilateral renal parenchymal lesions that were partially imaged and likely to represent cysts.     Latest Ref Rng & Units 12/02/2021    8:59 AM 06/02/2021    8:40 AM 12/02/2020   11:49 AM  CBC  WBC 4.0 - 10.5 K/uL 5.1  4.9  4.0   Hemoglobin 12.0 - 15.0 g/dL 11.8  11.7  11.8   Hematocrit 36.0 - 46.0 % 36.0  35.2  38.1   Platelets 150 - 400 K/uL 210  185  193        Latest Ref Rng & Units 12/02/2021    8:59 AM 06/02/2021    8:40 AM 11/22/2020   12:00 AM  BMP  Glucose 70 - 99 mg/dL 85  98    BUN 8 - 23 mg/dL 18  13    Creatinine 0.44 - 1.00 mg/dL 0.93  0.96    Sodium 135 - 145 mmol/L 139  140  139      Potassium 3.5 - 5.1 mmol/L 4.0  3.9  4.5      Chloride 98 - 111 mmol/L 106  107  104      CO2 22 - 32 mmol/L 28  29    Calcium 8.9 - 10.3 mg/dL 9.4  9.5  9.4         This result is from an external source.    BNP    Component Value Date/Time   BNP 21  09/21/2017 1516    ProBNP No results found for: "PROBNP"  PFT No results found for: "FEV1PRE", "FEV1POST", "FVCPRE", "FVCPOST", "TLC", "DLCOUNC", "PREFEV1FVCRT", "PSTFEV1FVCRT"  CT Chest Wo Contrast  Result Date: 05/15/2022 CLINICAL DATA:  Nodule EXAM: CT CHEST WITHOUT CONTRAST TECHNIQUE: Multidetector CT imaging of the chest was performed following the standard protocol without IV contrast. RADIATION DOSE REDUCTION: This exam was performed according to the departmental dose-optimization program which includes automated exposure control, adjustment of the mA and/or kV according to patient size and/or use of iterative reconstruction technique. COMPARISON:  01/12/2022 FINDINGS: Cardiovascular: Atheromatous changes of the aorta 3 cm aneurysmal dilatation at AP window. Prominence of the main arteries consistent with pulmonary arterial hypertension. No pericardial effusion or cardiomegaly.  Mediastinum/Nodes: No enlarged mediastinal or axillary lymph nodes. Thyroid gland, trachea, and esophagus demonstrate no significant findings. Lungs/Pleura: Emphysematous scarring diffuse bronchiectatic changes consistent with chronic bronchitis. Consolidation or scarring stable in the right base and. Nodularity right upper lobe laterally which has diminished when compared to the prior study. The largest nodule is now about 4 mm. No new nodules or suspicious lesions identified. Upper Abdomen: Stable bilateral partially imaged renal parenchymal lesions, likely cysts, which could be assessed with a renal protocol CT if indicated. Musculoskeletal: No chest wall mass or suspicious bone lesions identified. Extensive thoracic degenerative changes are identified each thoracic level. Lower cervical degenerative changes identified as well. IMPRESSION: 1. Right upper lobe nodularity has diminished compared to the previous examination. 2. Numerous foci of pleural/parenchymal changes consistent with scarring which are stable findings bilaterally. 3. Emphysematous scarring. 4. 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 Typically 3 years CT follow up recommended. 5. Bilateral renal parenchymal lesions that were partially imaged and likely to represent cysts. Electronically Signed   By: Sammie Bench M.D.   On: 05/15/2022 12:01   CT Cervical Spine Wo Contrast  Result Date: 05/10/2022 CLINICAL DATA:  Trauma, fall EXAM: CT CERVICAL SPINE WITHOUT CONTRAST TECHNIQUE: Multidetector CT imaging of the cervical spine was performed without intravenous contrast. Multiplanar CT image reconstructions were also generated. RADIATION DOSE REDUCTION: This exam was performed according to the departmental dose-optimization program which includes automated exposure control, adjustment of the mA and/or kV according to patient size and/or use of iterative  reconstruction technique. COMPARISON:  None Available. FINDINGS: Alignment: There is minimal anterolisthesis at C6-C7 level. Skull base and vertebrae: No recent fracture is seen. Degenerative changes are noted in cervical spine. Soft tissues and spinal canal: There is extrinsic pressure over the ventral margin of thecal sac caused by posterior bony spurs from C3-C6 levels. Disc levels: There is encroachment of neural foramina by bony spurs and facet hypertrophy from C2 to C6 levels. Upper chest: Minimal scarring is seen in the right apex. Other: None. IMPRESSION: No recent fracture is seen in cervical spine. Cervical spondylosis with encroachment of neural foramina from C2-C6 levels. Electronically Signed   By: Elmer Picker M.D.   On: 05/10/2022 16:13   CT Head Wo Contrast  Result Date: 05/10/2022 CLINICAL DATA:  Trauma, fall EXAM: CT HEAD WITHOUT CONTRAST TECHNIQUE: Contiguous axial images were obtained from the base of the skull through the vertex without intravenous contrast. RADIATION DOSE REDUCTION: This exam was performed according to the departmental dose-optimization program which includes automated exposure control, adjustment of the mA and/or kV according to patient size and/or use of iterative reconstruction technique. COMPARISON:  None Available. FINDINGS: Brain: No acute  intracranial findings are seen. There are no signs of bleeding within the cranium. Cortical sulci are prominent. There is decreased density in periventricular white matter. Vascular: Unremarkable. Skull: No fracture is seen in calvarium. There is subcutaneous hematoma in the left frontal scalp. Sinuses/Orbits: Possible small osteoma is seen in left ethmoid sinus. There is minimal mucosal thickening in ethmoid sinus. Other: None. IMPRESSION: No acute intracranial findings are seen. Atrophy. Small-vessel disease. There is subcutaneous hematoma in the left frontal scalp. No fracture is seen in calvarium. Electronically Signed    By: Elmer Picker M.D.   On: 05/10/2022 16:08     Past medical hx Past Medical History:  Diagnosis Date   Cancer ([place]) 03/2018   right breast cancer   Chronic kidney disease    CKD   DJD (degenerative joint disease)    LEFT HIP   Family history of breast cancer    Family history of ovarian cancer    Family history of prostate cancer    Family history of prostate cancer    H/O migraine 07/01/2019   Hyperlipidemia    takes Simvasatin daily   Hypertension    takes Propranolol and HCTZ daioly   Hyperthyroidism    Pneumonia 10/2015   PONV (postoperative nausea and vomiting)    when ether used   Shortness of breath dyspnea    with ambulation   Tremors of nervous system    takes Primidone daily     Social History   Tobacco Use   Smoking status: Every Day    Packs/day: 0.50    Years: 20.00    Total pack years: 10.00    Types: Cigarettes   Smokeless tobacco: Never   Tobacco comments:    02/13/22 Pt stated "I'm trying to quit" [name], CMA   Substance Use Topics   Alcohol use: No    Alcohol/week: 0.0 standard drinks of alcohol   Drug use: No    Ms.Jester reports that she has been smoking cigarettes. She has a 10.00 pack-year smoking history. She has never used smokeless tobacco. She reports that she does not drink alcohol and does not use drugs.  Tobacco Cessation: 21 pack year smoking history. Current smoker, Pt. Is working on quitting.  Past surgical hx, Family hx, Social hx all reviewed.  Current Outpatient Medications on File Prior to Visit  Medication Sig   albuterol (VENTOLIN HFA) 108 (90 Base) MCG/ACT inhaler Inhale 2 puffs into the lungs every 4 (four) hours as needed for wheezing or shortness of breath.   amLODipine (NORVASC) 10 MG tablet Take 1 tablet (10 mg total) by mouth daily.   anastrozole (ARIMIDEX) 1 MG tablet Take 1 tablet (1 mg total) by mouth daily.   aspirin (ASPIRIN ADULT) 325 MG tablet enteric coated; may use OTC    atorvastatin (LIPITOR) 40 MG tablet Take 1 tablet (40 mg total) by mouth daily.   Cholecalciferol (VITAMIN D3) 50 MCG (2000 UT) capsule Take 1 capsule (2,000 Units total) by mouth daily.   levothyroxine (SYNTHROID) 50 MCG tablet Take 1 tablet (50 mcg total) by mouth daily.   Omega-3 Fatty Acids (FISH OIL PO) Take 1,000 mg by mouth daily.   primidone (MYSOLINE) 50 MG tablet Take 2 tablets (100 mg total) by mouth every morning.   propranolol (INDERAL) 40 MG tablet Take 0.5 tablets (20 mg total) by mouth 2 (two) times daily.   TRELEGY ELLIPTA 100-62.5-25 MCG/ACT AEPB Inhale 1 puff into the lungs daily.   VOLTAREN 1 %  GEL APPLY TOPICALLY 4 TIMES DAILY AS NEEDED   No current facility-administered medications on file prior to visit.     Allergies  Allergen Reactions   Morphine And Related Itching   Other Itching and Nausea And Vomiting    "PAIN MEDICINES"   Percocet [Oxycodone-Acetaminophen] Nausea And Vomiting    Review Of Systems:  Constitutional:   No  weight loss, night sweats,  Fevers, chills, fatigue, or  lassitude.  HEENT:   No headaches,  Difficulty swallowing,  Tooth/dental problems, or  Sore throat,                No sneezing, itching, ear ache, nasal congestion, post nasal drip,   CV:  No chest pain,  Orthopnea, PND, swelling in lower extremities, anasarca, dizziness, palpitations, syncope.   GI  No heartburn, indigestion, abdominal pain, nausea, vomiting, diarrhea, change in bowel habits, loss of appetite, bloody stools.   Resp: No shortness of breath with exertion or at rest.  No excess mucus, no productive cough,  No non-productive cough,  No coughing up of blood.  No change in color of mucus.  No wheezing.  No chest wall deformity  Skin: no rash or lesions.  GU: no dysuria, change in color of urine, no urgency or frequency.  No flank pain, no hematuria   MS:  No joint pain or swelling.  No decreased range of motion.  No back pain.  Psych:  No change in mood or affect.  No depression or anxiety.  No memory loss.   Vital Signs BP 118/72 (BP Location: Left Arm, Patient Position: Sitting, Cuff Size: Normal)   Pulse 60   Temp 98.1 F (36.7 C) (Oral)   Ht '5\' 9"'$  (1.753 m)   Wt 157 lb 9.6 oz (71.5 kg)   LMP 01/24/2000   SpO2 99%   BMI 23.27 kg/m    Physical Exam:  General- No distress,  A&Ox3, pleasant ENT: No sinus tenderness, TM clear, pale nasal mucosa, no oral exudate,no post nasal drip, no LAN Cardiac: S1, S2, regular rate and rhythm, no murmur Chest: No wheeze/ rales/ dullness; no accessory muscle use, no nasal flaring, no sternal retractions, slightly diminished per bases Abd.: Soft Non-tender, ND, BS +, Body mass index is 23.27 kg/m.  Ext: No clubbing cyanosis, edema Neuro:  normal strength, MAE x 4, A&O x 3 Skin: No rashes, warm and dry, no lesions  Psych: normal mood and behavior   Assessment/Plan  Lung Nodules in current every day smoker Plan Follow up CT Chest without contrast in 12 months Follow up appointment with Judson Roch NP after scan is done Please work on quitting smoking.  Call 1-800-quit now for free nicotine patches, gum or mints   Suspected COPD Takes Trelegy as maintenance Albuterol as rescue No PFT's on file Plan  Continue Trelegy and albuterol inhaler as needed for shortness of breath or wheezing. Managed by PCP  Aortic Aneurysm Plan We will do a 12 month follow up low dose CT Chest and evaluate the area for stability We will refer to thoracic surgery is this enlarges.  Continue good BP control.  Seek emergency care for sudden onset back pain.   I spent 35 minutes dedicated to the care of this patient on the date of this encounter to include pre-visit review of records, face-to-face time with the patient discussing conditions above, post visit ordering of testing, clinical documentation with the electronic health record, making appropriate referrals as documented, and communicating necessary information to the  patient's healthcare team.     Magdalen Spatz, NP 05/24/2022  2:04 PM

## 2022-05-24 NOTE — Patient Instructions (Addendum)
Follow up CT Chest without contrast in 12 months Follow up appointment with Judson Roch NP after scan is done( 1 year from now) Please work on quitting smoking.  Call 1-800-quit now for free nicotine patches, gum or mints Continue Trelegy and albuterol inhaler as needed for shortness of breath or wheezing. We will refer to thoracic surgery is this enlarges.  Continue good BP control.  Seek emergency care for sudden onset back pain.  Please contact office for sooner follow up if symptoms do not improve or worsen or seek emergency care

## 2022-06-02 ENCOUNTER — Ambulatory Visit: Payer: Medicare HMO

## 2022-06-02 ENCOUNTER — Other Ambulatory Visit: Payer: Medicare HMO

## 2022-06-02 ENCOUNTER — Ambulatory Visit: Payer: Medicare HMO | Admitting: Hematology and Oncology

## 2022-06-08 ENCOUNTER — Ambulatory Visit (HOSPITAL_COMMUNITY): Payer: Medicare HMO

## 2022-06-13 ENCOUNTER — Ambulatory Visit: Payer: Medicare HMO | Admitting: Acute Care

## 2022-06-30 ENCOUNTER — Inpatient Hospital Stay: Payer: Medicare HMO

## 2022-08-01 ENCOUNTER — Encounter: Payer: Self-pay | Admitting: Hematology and Oncology

## 2022-08-02 ENCOUNTER — Ambulatory Visit (HOSPITAL_BASED_OUTPATIENT_CLINIC_OR_DEPARTMENT_OTHER): Payer: Medicare HMO | Admitting: Internal Medicine

## 2022-08-29 NOTE — Progress Notes (Unsigned)
Cardiology Office Note:    Date:  08/30/2022   ID:  Catherine Lowery, DOB 1946/04/20, MRN [id number]  PCP:  [name], NP [facility] Cardiologist: [name], MD   Reason for visit: Year follow-up  History of Present Illness:    Catherine Lowery is a 78 y.o. female with a hx of Right breast cancer, CKD, hyperlipidemia, hypertension, hypothyroidism status post thyroid ablation -now on levothyroxine.  Is a history of atypical chest pain with negative Myoview in 2016.  She last saw Dr. Rennis Golden in March 2023.  BP controlled.    Today, she states she is doing well.  She denies chest pain.  She has COPD and a lung nodule she sees pulmonology for.  She denies shortness of breath and is cutting back on her cigarette use.  She has not needed to use inhaler.  She denies palpitations, PND, orthopnea, lower extremity edema and syncope.  She takes aspirin 325 mg daily -she states she was previously on 81 mg in the beginning and they tried to take her back to 81 mg but it was not " working as it should."  She denies bleeding issues on aspirin 325mg .        Past Medical History:  Diagnosis Date   Cancer (HCC) 03/2018   right breast cancer   Chronic kidney disease    CKD   DJD (degenerative joint disease)    LEFT HIP   Family history of breast cancer    Family history of ovarian cancer    Family history of prostate cancer    Family history of prostate cancer    H/O migraine 07/01/2019   Hyperlipidemia    takes Simvasatin daily   Hypertension    takes Propranolol and HCTZ daioly   Hyperthyroidism    Pneumonia 10/2015   PONV (postoperative nausea and vomiting)    when ether used   Shortness of breath dyspnea    with ambulation   Tremors of nervous system    takes Primidone daily    Past Surgical History:  Procedure Laterality Date   BOne Spur     left heel   BREAST LUMPECTOMY WITH RADIOACTIVE SEED AND SENTINEL LYMPH NODE BIOPSY Right 05/07/2018   Procedure: RIGHT BREAST  LUMPECTOMY WITH RADIOACTIVE SEED AND RIGHT  SENTINEL LYMPH NODE MAPPING;  Surgeon: Harriette Bouillon, MD;  Location: [facility];  Service: General;  Laterality: Right;   COLONOSCOPY     JOINT REPLACEMENT Bilateral    knee   KNEE SURGERY     RE-EXCISION OF BREAST LUMPECTOMY Right 06/05/2018   Procedure: RE-EXCISION OF RIGHT BREAST LUMPECTOMY;  Surgeon: Harriette Bouillon, MD;  Location: [facility];  Service: General;  Laterality: Right;   ROTATOR CUFF REPAIR Bilateral    TOTAL HIP ARTHROPLASTY Left 12/29/2015   TOTAL HIP ARTHROPLASTY Left 12/29/2015   Procedure: TOTAL HIP ARTHROPLASTY ANTERIOR APPROACH;  Surgeon: Loreta Ave, MD;  Location: [facility];  Service: Orthopedics;  Laterality: Left;    Current Medications: Current Meds  Medication Sig   albuterol (VENTOLIN HFA) 108 (90 Base) MCG/ACT inhaler Inhale 2 puffs into the lungs every 4 (four) hours as needed for wheezing or shortness of breath.   amLODipine (NORVASC) 10 MG tablet Take 1 tablet (10 mg total) by mouth daily.   anastrozole (ARIMIDEX) 1 MG tablet Take 1 tablet (1 mg total) by mouth daily.   aspirin (ASPIRIN ADULT) 325 MG tablet enteric coated; may use OTC  atorvastatin (LIPITOR) 40 MG tablet Take 1 tablet (40 mg total) by mouth daily.   Cholecalciferol (VITAMIN D3) 50 MCG (2000 UT) capsule Take 1 capsule (2,000 Units total) by mouth daily.   levothyroxine (SYNTHROID) 50 MCG tablet Take 1 tablet (50 mcg total) by mouth daily.   Omega-3 Fatty Acids (FISH OIL PO) Take 1,000 mg by mouth daily.   primidone (MYSOLINE) 50 MG tablet Take 2 tablets (100 mg total) by mouth every morning.   propranolol (INDERAL) 40 MG tablet Take 0.5 tablets (20 mg total) by mouth 2 (two) times daily.   TRELEGY ELLIPTA 100-62.5-25 MCG/ACT AEPB Inhale 1 puff into the lungs daily.   VOLTAREN 1 % GEL APPLY TOPICALLY 4 TIMES DAILY AS NEEDED     Allergies:   Morphine and related, Other, and Percocet [oxycodone-acetaminophen]    Social History   Socioeconomic History   Marital status: Single    Spouse name: Not on file   Number of children: 1   Years of education: 14   Highest education level: Not on file  Occupational History    Comment: retired  Tobacco Use   Smoking status: Every Day    Packs/day: 0.75    Years: 28.00    Additional pack years: 0.00    Total pack years: 21.00    Types: Cigarettes   Smokeless tobacco: Never   Tobacco comments:    02/13/22 Pt stated "I'm trying to quit" Glynda Jaeger, CMA   Substance and Sexual Activity   Alcohol use: No    Alcohol/week: 0.0 standard drinks of alcohol   Drug use: No   Sexual activity: Not Currently    Birth control/protection: Post-menopausal  Other Topics Concern   Not on file  Social History Narrative   Lives with handicapped son   Caffeine- coffee, 2 cups daily, 1-2 sodas daily   Social Determinants of Health   Financial Resource Strain: Low Risk  (01/28/2020)   Overall Financial Resource Strain (CARDIA)    Difficulty of Paying Living Expenses: Not very hard  Food Insecurity: Not on file  Transportation Needs: Not on file  Physical Activity: Not on file  Stress: Not on file  Social Connections: Not on file     Family History: The patient's family history includes Breast cancer in her cousin; Cancer in her cousin, cousin, cousin, and paternal aunt; Heart disease in her mother; Hypertension in her mother; Multiple myeloma (age of onset: 61) in her father; Ovarian cancer in her paternal aunt; Prostate cancer in her paternal uncle; Stroke in her mother; Throat cancer in her paternal uncle; Thyroid cancer in her sister.  ROS:   Please see the history of present illness.     EKGs/Labs/Other Studies Reviewed:    EKG:  The ekg ordered today demonstrates sinus bradycardia with heart rate 55.  Recent Labs: 12/02/2021: ALT 12; BUN 18; Creatinine 0.93; Hemoglobin 11.8; Platelet Count 210; Potassium 4.0; Sodium 139   Recent Lipid  Panel Lab Results  Component Value Date/Time   CHOL 133 12/02/2020 11:49 AM   TRIG 105 12/02/2020 11:49 AM   HDL 35 (L) 12/02/2020 11:49 AM   LDLCALC 79 12/02/2020 11:49 AM    Physical Exam:    VS:  BP 124/68   Pulse (!) 55   Ht 5' 9.5" (1.765 m)   Wt 155 lb (70.3 kg)   LMP 01/24/2000   SpO2 95%   BMI 22.56 kg/m    No data found.   Wt Readings from Last 3  Encounters:  08/30/22 155 lb (70.3 kg)  05/24/22 157 lb 9.6 oz (71.5 kg)  05/10/22 158 lb 15.2 oz (72.1 kg)     GEN:  Well nourished, well developed in no acute distress HEENT: Normal NECK: No JVD; No carotid bruits CARDIAC: RRR, no murmurs, rubs, gallops RESPIRATORY:  Clear to auscultation without rales, wheezing or rhonchi  ABDOMEN: Soft, non-tender, non-distended MUSCULOSKELETAL: No edema SKIN: Warm and dry NEUROLOGIC:  Alert and oriented PSYCHIATRIC:  Normal affect     ASSESSMENT AND PLAN   History of precordial pain, no recent symptoms -Negative Myoview in 2016 -EKG without ischemic changes -Recommend tobacco cessation. -On a beta-blocker for her tremor.  Continue statin therapy with goal LDL less than 70.  Continue aspirin.    Hypertension well-controlled -Continue amlodipine 10 mg daily.  Continue propranolol. -Goal BP is <130/80.  Recommend DASH diet (high in vegetables, fruits, low-fat dairy products, whole grains, poultry, fish, and nuts and low in sweets, sugar-sweetened beverages, and red meats), salt restriction and increase physical activity.  Hyperlipidemia with goal LDL less than 70 -Continue Lipitor 40 mg daily.  Disposition - Follow-up as needed.  She follows regularly with her PCP, pulmonology and oncology.  We discussed signs and symptoms of ischemic heart disease.  Tobacco cessation strongly encouraged.   Medication Adjustments/Labs and Tests Ordered: Current medicines are reviewed at length with the patient today.  Concerns regarding medicines are outlined above.  Orders Placed This  Encounter  Procedures   EKG 12-Lead   No orders of the defined types were placed in this encounter.   Patient Instructions  Medication Instructions:  No Changes *If you need a refill on your cardiac medications before your next appointment, please call your pharmacy*   Lab Work: No Labs If you have labs (blood work) drawn today and your tests are completely normal, you will receive your results only by: MyChart Message (if you have MyChart) OR A paper copy in the mail If you have any lab test that is abnormal or we need to change your treatment, we will call you to review the results.   Testing/Procedures: No Testing   Follow-Up: At [facility], you and your health needs are our priority.  As part of our continuing mission to provide you with exceptional heart care, we have created designated Provider Care Teams.  These Care Teams include your primary Cardiologist (physician) and Advanced Practice Providers (APPs -  Physician Assistants and Nurse Practitioners) who all work together to provide you with the care you need, when you need it.  We recommend signing up for the patient portal called "MyChart".  Sign up information is provided on this After Visit Summary.  MyChart is used to connect with patients for Virtual Visits (Telemedicine).  Patients are able to view lab/test results, encounter notes, upcoming appointments, etc.  Non-urgent messages can be sent to your provider as well.   To learn more about what you can do with MyChart, go to [email or web address].    Your next appointment:   As Needed  Provider:   [name], MD     Signed, Cannon Kettle, PA-C  08/30/2022 11:09 AM    [facility]

## 2022-08-30 ENCOUNTER — Ambulatory Visit: Payer: Medicare HMO | Attending: Internal Medicine | Admitting: Physician Assistant

## 2022-08-30 ENCOUNTER — Encounter: Payer: Self-pay | Admitting: Physician Assistant

## 2022-08-30 VITALS — BP 124/68 | HR 55 | Ht 69.5 in | Wt 155.0 lb

## 2022-08-30 DIAGNOSIS — I10 Essential (primary) hypertension: Secondary | ICD-10-CM | POA: Diagnosis not present

## 2022-08-30 DIAGNOSIS — E785 Hyperlipidemia, unspecified: Secondary | ICD-10-CM | POA: Diagnosis not present

## 2022-08-30 DIAGNOSIS — R072 Precordial pain: Secondary | ICD-10-CM | POA: Diagnosis not present

## 2022-08-30 NOTE — Patient Instructions (Signed)
Medication Instructions:  No Changes *If you need a refill on your cardiac medications before your next appointment, please call your pharmacy*   Lab Work: No Labs If you have labs (blood work) drawn today and your tests are completely normal, you will receive your results only by: MyChart Message (if you have MyChart) OR A paper copy in the mail If you have any lab test that is abnormal or we need to change your treatment, we will call you to review the results.   Testing/Procedures: No Testing   Follow-Up: At [facility], you and your health needs are our priority.  As part of our continuing mission to provide you with exceptional heart care, we have created designated Provider Care Teams.  These Care Teams include your primary Cardiologist (physician) and Advanced Practice Providers (APPs -  Physician Assistants and Nurse Practitioners) who all work together to provide you with the care you need, when you need it.  We recommend signing up for the patient portal called "MyChart".  Sign up information is provided on this After Visit Summary.  MyChart is used to connect with patients for Virtual Visits (Telemedicine).  Patients are able to view lab/test results, encounter notes, upcoming appointments, etc.  Non-urgent messages can be sent to your provider as well.   To learn more about what you can do with MyChart, go to [email or web address].    Your next appointment:   As Needed  Provider:   [name], MD

## 2022-09-22 ENCOUNTER — Encounter: Payer: Self-pay | Admitting: Genetic Counselor

## 2022-09-22 NOTE — Progress Notes (Signed)
UPDATE: The [name] [id number] (Silent) VUS has been reclassified to "Likely Benign." The report date is 09/20/2022.

## 2022-12-28 ENCOUNTER — Other Ambulatory Visit: Payer: Self-pay | Admitting: Hematology and Oncology

## 2022-12-28 DIAGNOSIS — Z170 Estrogen receptor positive status [ER+]: Secondary | ICD-10-CM

## 2022-12-29 ENCOUNTER — Inpatient Hospital Stay: Payer: Medicare HMO | Attending: Hematology and Oncology | Admitting: Hematology and Oncology

## 2022-12-29 ENCOUNTER — Inpatient Hospital Stay: Payer: Medicare HMO

## 2022-12-29 ENCOUNTER — Encounter: Payer: Self-pay | Admitting: Hematology and Oncology

## 2022-12-29 ENCOUNTER — Ambulatory Visit: Payer: Medicare HMO

## 2022-12-29 VITALS — BP 114/55 | HR 57 | Temp 97.5°F | Resp 18 | Ht 69.5 in | Wt 147.9 lb

## 2022-12-29 DIAGNOSIS — Z923 Personal history of irradiation: Secondary | ICD-10-CM | POA: Insufficient documentation

## 2022-12-29 DIAGNOSIS — C50311 Malignant neoplasm of lower-inner quadrant of right female breast: Secondary | ICD-10-CM

## 2022-12-29 DIAGNOSIS — Z170 Estrogen receptor positive status [ER+]: Secondary | ICD-10-CM | POA: Insufficient documentation

## 2022-12-29 DIAGNOSIS — D509 Iron deficiency anemia, unspecified: Secondary | ICD-10-CM | POA: Insufficient documentation

## 2022-12-29 LAB — CBC WITH DIFFERENTIAL/PLATELET
Abs Immature Granulocytes: 0.01 10*3/uL (ref 0.00–0.07)
Basophils Absolute: 0.1 10*3/uL (ref 0.0–0.1)
Basophils Relative: 1 %
Eosinophils Absolute: 0.2 10*3/uL (ref 0.0–0.5)
Eosinophils Relative: 5 %
HCT: 35.3 % — ABNORMAL LOW (ref 36.0–46.0)
Hemoglobin: 11.5 g/dL — ABNORMAL LOW (ref 12.0–15.0)
Immature Granulocytes: 0 %
Lymphocytes Relative: 26 %
Lymphs Abs: 1.1 10*3/uL (ref 0.7–4.0)
MCH: 23.3 pg — ABNORMAL LOW (ref 26.0–34.0)
MCHC: 32.6 g/dL (ref 30.0–36.0)
MCV: 71.5 fL — ABNORMAL LOW (ref 80.0–100.0)
Monocytes Absolute: 0.4 10*3/uL (ref 0.1–1.0)
Monocytes Relative: 10 %
Neutro Abs: 2.5 10*3/uL (ref 1.7–7.7)
Neutrophils Relative %: 58 %
Platelets: 219 10*3/uL (ref 150–400)
RBC: 4.94 MIL/uL (ref 3.87–5.11)
RDW: 14.6 % (ref 11.5–15.5)
WBC: 4.4 10*3/uL (ref 4.0–10.5)
nRBC: 0 % (ref 0.0–0.2)

## 2022-12-29 LAB — CMP (CANCER CENTER ONLY)
ALT: 22 U/L (ref 0–44)
AST: 27 U/L (ref 15–41)
Albumin: 3.9 g/dL (ref 3.5–5.0)
Alkaline Phosphatase: 61 U/L (ref 38–126)
Anion gap: 5 (ref 5–15)
BUN: 17 mg/dL (ref 8–23)
CO2: 28 mmol/L (ref 22–32)
Calcium: 9.2 mg/dL (ref 8.9–10.3)
Chloride: 106 mmol/L (ref 98–111)
Creatinine: 0.87 mg/dL (ref 0.44–1.00)
GFR, Estimated: >60 mL/min (ref >60)
Glucose, Bld: 96 mg/dL (ref 70–99)
Potassium: 4.2 mmol/L (ref 3.5–5.1)
Sodium: 139 mmol/L (ref 135–145)
Total Bilirubin: 0.4 mg/dL (ref 0.3–1.2)
Total Protein: 7.3 g/dL (ref 6.5–8.1)

## 2022-12-29 NOTE — Progress Notes (Signed)
[facility]  Telephone:(336) [phone] Fax:([phone]     ID: RAMYA LINGG DOB: 1945-05-05  MR#: [id number]  JYN#:[id number]  Patient Care Team: [name], NP as PCP - General (Nurse Practitioner) [name], MD as PCP - Cardiology (Cardiology) Magrinat, Valentino Hue, MD (Inactive) as Consulting Physician (Oncology) [name], MD as Consulting Physician (Radiation Oncology) [name], MD as Attending Physician (Orthopedic Surgery) Harriette Bouillon, MD as Consulting Physician (General Surgery) Rachael Fee, MD as Attending Physician (Gastroenterology) [name], MD as Consulting Physician (Neurology) Lauris Poag, MD as Consulting Physician (Nephrology) [name], [facility] (Inactive) as Pharmacist (Pharmacist) OTHER MD:   CHIEF COMPLAINT: 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  She is here for follow-up on anastrozole.  She is doing well, no hot flashes, just noticed some hair thinning. Mammogram in March looks well, no evidence of malignancy. No change in breast concerning for recurrence. She had a fall and had to go to the ED, had a hematoma, no fractures. She is otherwise doing well overall.  Rest of the pertinent 10 point ROS reviewed and negative  COVID 19 VACCINATION STATUS: Pfizer x4, last 10/2020   HISTORY OF CURRENT ILLNESS: From the original intake note:  JENNYE BLEYER had routine screening mammography on 02/26/2018 showing a possible abnormality in the right breast. She underwent unilateral right diagnostic mammography with tomography and right breast ultrasonography at [facility] on 03/07/2018 showing: Breast Density Category B. On mammography, there is a new 1.5 cm irregular architectural distortion with a spiculated margin in the right  breast at 5 o'clock anterior depth. On ultrasound, there is a 2.7 cm irregular solid mass in the right breast at 5 o'clock middle depth. This irregular mass displays posterior acoustic shadowing. No significant abnormalities were seen sonographically in the right axilla.  Accordingly on 03/18/2018 she proceeded to biopsy of the right breast area in question. The pathology from this procedure showed ([id number]): Invasive mammary carcinoma e-cadherin negative, grade II-III. 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 Prognostic indicators significant for: estrogen receptor, 95% positive and progesterone receptor, 95% positive, both with strong staining intensity. Proliferation marker Ki67 at 5%. HER2 negative by immunohistochemistry (1+).  On 04/10/2018 the patient underwent bilateral breast MRI with and without contrast showing a 2.3 cm spiculated enhancing mass associated with non-masslike enhancement, the total abnormal area measuring 4.6 cm.  There was no evidence of lymph node abnormality and the contralateral breast was benign appearing  The patient's subsequent history is as detailed below.   PAST MEDICAL HISTORY: Past Medical History:  Diagnosis Date   Cancer (HCC) 03/2018   right breast cancer   Chronic kidney disease    CKD   DJD (degenerative joint disease)    LEFT HIP   Family history of breast cancer    Family history of ovarian cancer    Family history of prostate cancer    Family history of prostate cancer    H/O migraine 07/01/2019   Hyperlipidemia    takes Simvasatin daily   Hypertension    takes Propranolol and HCTZ daioly   Hyperthyroidism    Pneumonia 10/2015   PONV (postoperative nausea and vomiting)    when ether used   Shortness of breath dyspnea    with ambulation   Tremors of nervous system  takes Primidone daily    PAST SURGICAL HISTORY: Past Surgical History:  Procedure Laterality Date   BOne Spur     left heel   BREAST  LUMPECTOMY WITH RADIOACTIVE SEED AND SENTINEL LYMPH NODE BIOPSY Right 05/07/2018   Procedure: RIGHT BREAST LUMPECTOMY WITH RADIOACTIVE SEED AND RIGHT  SENTINEL LYMPH NODE MAPPING;  Surgeon: Harriette Bouillon, MD;  Location: [facility];  Service: General;  Laterality: Right;   COLONOSCOPY     JOINT REPLACEMENT Bilateral    knee   KNEE SURGERY     RE-EXCISION OF BREAST LUMPECTOMY Right 06/05/2018   Procedure: RE-EXCISION OF RIGHT BREAST LUMPECTOMY;  Surgeon: Harriette Bouillon, MD;  Location: [facility];  Service: General;  Laterality: Right;   ROTATOR CUFF REPAIR Bilateral    TOTAL HIP ARTHROPLASTY Left 12/29/2015   TOTAL HIP ARTHROPLASTY Left 12/29/2015   Procedure: TOTAL HIP ARTHROPLASTY ANTERIOR APPROACH;  Surgeon: Loreta Ave, MD;  Location: [facility];  Service: Orthopedics;  Laterality: Left;    FAMILY HISTORY Family History  Problem Relation Age of Onset   Hypertension Mother    Heart disease Mother    Stroke Mother    Multiple myeloma Father 55   Thyroid cancer Sister        dx 31's/50's   Cancer Paternal Aunt        unknown   Prostate cancer Paternal Uncle        dx >50- cancer was the cause of his death   Ovarian cancer Paternal Aunt    Throat cancer Paternal Uncle    Breast cancer Cousin        dx >50   Cancer Cousin        type unk   Cancer Cousin        type unk   Cancer Cousin        type unk  She notes that her father died from a multiple myeloma at age 35. Patients' mother died from stroke at age 55. The patient has 1 brother and 2 sisters. One paternal aunt had ovarian cancer.  One cousin had breast cancer, and her 41s. Another paternal aunt had cancer, type unknown to the patient, and a paternal uncle had prostate cancer.    GYNECOLOGIC HISTORY:  Menarche: 77 years old Age at first live birth: 77 years old GX P: 1 LMP: Patient's last menstrual period was 01/24/2000. 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 Before that, she has been employed at [employer], [employer], and as an elderly caregiver. 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 She is separated, and has not spoken to her husband, Lupita Shutter, in over 10 years. She lives with her only son, Marybel Dankert, who is disabled secondary to SCD and multiple surgeries. She has no pets. She attends [employer].    ADVANCED DIRECTIVES: Her sister, Zorita Pang, is Ayelet's medical power of attorney. Sallye Ober can be reached at [phone]. Gellner has stated that her husband, Lupita Shutter, is not to have access to her medical records or to make decisions on her behalf. ]   HEALTH MAINTENANCE: Social History   Tobacco Use   Smoking status: Every Day    Current packs/day: 0.75    Average packs/day: 0.8 packs/day for 28.0  years (21.0 ttl pk-yrs)    Types: Cigarettes   Smokeless tobacco: Never   Tobacco comments:    02/13/22 Pt stated "I'm trying to quit" Glynda Jaeger, CMA   Substance Use Topics   Alcohol use: No    Alcohol/week: 0.0 standard drinks of alcohol   Drug use: No     Colonoscopy: 01/2015  PAP:   Bone density:  02/26/2018: T-score 1.4   Allergies  Allergen Reactions   Morphine And Codeine Itching   Other Itching and Nausea And Vomiting    "PAIN MEDICINES"   Percocet [Oxycodone-Acetaminophen] Nausea And Vomiting    Current Outpatient Medications  Medication Sig Dispense Refill   albuterol (VENTOLIN HFA) 108 (90 Base) MCG/ACT inhaler Inhale 2 puffs into the lungs every 4 (four) hours as needed for wheezing or shortness of breath.     amLODipine (NORVASC) 10 MG tablet Take 1 tablet (10 mg total) by mouth daily. 90 tablet 1   anastrozole (ARIMIDEX) 1 MG tablet Take 1 tablet (1 mg total) by mouth daily. 90 tablet 4   aspirin  (ASPIRIN ADULT) 325 MG tablet enteric coated; may use OTC 30 tablet 0   atorvastatin (LIPITOR) 40 MG tablet Take 1 tablet (40 mg total) by mouth daily. 90 tablet 1   Cholecalciferol (VITAMIN D3) 50 MCG (2000 UT) capsule Take 1 capsule (2,000 Units total) by mouth daily. 90 capsule 1   levothyroxine (SYNTHROID) 50 MCG tablet Take 1 tablet (50 mcg total) by mouth daily. 90 tablet 3   Omega-3 Fatty Acids (FISH OIL PO) Take 1,000 mg by mouth daily.     primidone (MYSOLINE) 50 MG tablet Take 2 tablets (100 mg total) by mouth every morning. 180 tablet 2   propranolol (INDERAL) 40 MG tablet Take 0.5 tablets (20 mg total) by mouth 2 (two) times daily. 90 tablet 1   TRELEGY ELLIPTA 100-62.5-25 MCG/ACT AEPB Inhale 1 puff into the lungs daily.     VOLTAREN 1 % GEL APPLY TOPICALLY 4 TIMES DAILY AS NEEDED 100 g 0   No current facility-administered medications for this visit.    OBJECTIVE: African-American woman in no acute distress  Vitals:   12/29/22 0908  BP: (!) 114/55  Pulse: (!) 57  Resp: 18  Temp: (!) 97.5 F (36.4 C)  SpO2: 98%    Wt Readings from Last 3 Encounters:  12/29/22 147 lb 14.4 oz (67.1 kg)  08/30/22 155 lb (70.3 kg)  05/24/22 157 lb 9.6 oz (71.5 kg)   Body mass index is 21.53 kg/m.    ECOG FS:1 - Symptomatic but completely ambulatory   Physical Exam Constitutional:      Appearance: Normal appearance.  Chest:     Comments: Bilateral breasts inspected.  Right breast status postlumpectomy.  No palpable masses or regional adenopathy.  Left breast normal to inspection and palpation.  No regional adenopathy. Musculoskeletal:     Cervical back: Normal range of motion. No rigidity.  Lymphadenopathy:     Cervical: No cervical adenopathy.  Neurological:     Mental Status: She is alert.       LAB RESULTS:  CMP     Component Value Date/Time   NA 139 12/02/2021 0859   NA 139 11/22/2020 0000   K 4.0 12/02/2021 0859   CL 106 12/02/2021 0859   CO2 28 12/02/2021 0859    GLUCOSE 85 12/02/2021 0859   BUN 18 12/02/2021 0859   CREATININE 0.93 12/02/2021 0859   CREATININE 1.05 (H) 05/19/2019 1034  CALCIUM 9.4 12/02/2021 0859   PROT 7.6 12/02/2021 0859   ALBUMIN 4.0 12/02/2021 0859   AST 18 12/02/2021 0859   ALT 12 12/02/2021 0859   ALKPHOS 90 12/02/2021 0859   BILITOT 0.4 12/02/2021 0859   GFRNONAA >60 12/02/2021 0859   GFRNONAA 48 (L) 01/23/2018 0804   GFRAA >60 06/23/2019 1524   GFRAA 56 (L) 01/23/2018 0804    No results found for: "TOTALPROTELP", "ALBUMINELP", "A1GS", "A2GS", "BETS", "BETA2SER", "GAMS", "MSPIKE", "SPEI"  No results found for: "KPAFRELGTCHN", "LAMBDASER", "KAPLAMBRATIO"  Lab Results  Component Value Date   WBC 4.4 12/29/2022   NEUTROABS 2.5 12/29/2022   HGB 11.5 (L) 12/29/2022   HCT 35.3 (L) 12/29/2022   MCV 71.5 (L) 12/29/2022   PLT 219 12/29/2022   No results found for: "LABCA2"  No components found for: "URKYHC623"  No results for input(s): "INR" in the last 168 hours.  No results found for: "LABCA2"  No results found for: "JSE831"  No results found for: "CAN125"  No results found for: "CAN153"  No results found for: "CA2729"  No components found for: "HGQUANT"  No results found for: "CEA1", "CEA" / No results found for: "CEA1", "CEA"   No results found for: "AFPTUMOR"  No results found for: "CHROMOGRNA"  Lab Results  Component Value Date   HGBA 70.8 (L) 06/23/2019   HGBA2QUANT 3.6 (H) 06/23/2019   HGBFQUANT 0.0 06/23/2019   HGBSQUAN 25.6 (H) 06/23/2019   (Hemoglobinopathy evaluation)   Lab Results  Component Value Date   LDH 147 06/23/2019    Lab Results  Component Value Date   IRON 82 12/02/2020   TIBC 267 12/02/2020   IRONPCTSAT 31 12/02/2020   (Iron and TIBC)  Lab Results  Component Value Date   FERRITIN 161 12/02/2020    Urinalysis    Component Value Date/Time   COLORURINE YELLOW 09/21/2017 1533   APPEARANCEUR CLOUDY (A) 09/21/2017 1533   LABSPEC 1.015 09/21/2017 1533    PHURINE 5.5 09/21/2017 1533   GLUCOSEU NEGATIVE 09/21/2017 1533   HGBUR TRACE (A) 09/21/2017 1533   BILIRUBINUR SMALL (A) 03/24/2011 1054   KETONESUR NEGATIVE 09/21/2017 1533   PROTEINUR NEGATIVE 09/21/2017 1533   UROBILINOGEN 0.2 03/24/2011 1054   NITRITE NEGATIVE 09/21/2017 1533   LEUKOCYTESUR TRACE (A) 09/21/2017 1533    STUDIES:  No results found.   ELIGIBLE FOR AVAILABLE RESEARCH PROTOCOL: no   ASSESSMENT: 77 y.o. [name], [name] woman status post right breast lower inner quadrant biopsy 03/18/2018 for a clinical T2N0, [name], grade 2 or 3, estrogen and progesterone receptor positive, [name], with an MIB-1 of 5%  (1) Genetic testing performed through [facility] Common Hereditary Cancers Panel + Thyroid Cancer Panel on 05/09/2018 showing no deleterious mutations APC, [name], [name], [name], [name], [name], [name], [name], [name], [name], [name], [name], [name], [name], [name], [name], [name], [name], [name], [name], [name], KIT, MEN1, [name], [name], [name], [name], [name], [name], [name], [name], [name], [name], [name], [name], [name], POLE, [name], [name], RAD50, [name], [name], [name], [name], [name], [name], [name], [name], [name], [name], [name], [name], [name], [name], [name], VHL.   (a) Two variants of uncertain significance in the genes [name] c.1176C>T (Silent) and [name] c.1419+4C>T (Intronic) were identified.  (2) right lumpectomy and sentinel lymph node sampling 05/07/2018 showed a pT2 pN1, [name], grade 2, with a positive inferior margin  (a) reexcision scheduled for 06/05/2018  (3) MammaPrint obtained from the definitive surgical sample read as "low risk" predicting a disease-free  survival at 5 years of 97.8% without need of chemotherapy.  (4) adjuvant radiation 07/11/2018 - 08/27/2018  (a) 1. Right breast; [name]; 25 fractions of 1.8 Gy for a total of 45  Gy                      3. Boost; 6 fractions of 2 Gy for a total of 12 Gy  (5) anastrozole started 10/02/2018  (a) bone density 05/05/2021, [name], normal imits       (6) genetic testing 05/18/2018 through the Common Hereditary Cancers Panel + Thyroid Caner panel showed no deleterious mutations in APC, [name], [name], [name], [name], [name], [name], [name], [name], [name], [name], [name], [name], [name], [name], [name], [name], [name], [name], [name], [name], KIT, MEN1, [name], [name], [name], [name], [name], [name], [name], [name], [name], [name], [name], [name], [name], POLE, [name], [name], RAD50, [name], [name], [name], [name], [name], [name], [name], [name], [name], [name], [name], [name], [name], [name], [name], VHL.  (a) 2 variants of uncertain significance in the genes [name] c.1176C>T (Silent) and [name] c.1419+4C>T (Intronic) were identified.    (b) UPDATE: The [name] c.1419+4C>T (Intronic) VUS has been reclassified to "Likely Benign." The report date is 08/08/2018.  (7) the patient has thalassemia/sickle cell trait: Hemoglobin electrophoresis 06/23/2019 shows hemoglobin 87.8%, [name], hemoglobin A2 elevated at 3.6%   PLAN:  Ms. Gavrielle is here for a follow-up on Arimidex.  She has been tolerating this very well. No concerns on exam today, right breast status post surgery and postsurgical changes noted. With regards to bone density, last bone density in 2023 is within normal limits, this is going to be repeated in 2025.  In the meantime she will continue vitamin D supplementation and weightbearing exercises She does have some mild microcytic anemia which is likely from thalassemia/sickle cell trait, at this time there is no need for considering iron since her hemoglobin has not changed much She will continue anastrozole and return to clinic in approximately 1 year, we have once again discussed about considering anastrozole beyond 5 years given lobular histology. Return to clinic in 1  year or sooner as needed.  No concerning findings on physical examination.  Total time spent: 30 minutes.  *Total Encounter Time as defined by the Centers for Medicare and Medicaid Services includes, in addition to the face-to-face time of a patient visit (documented in the note above) non-face-to-face time: obtaining and reviewing outside history, ordering and reviewing medications, tests or procedures, care coordination (communications with other health care professionals or caregivers) and documentation in the medical record.

## 2023-03-08 ENCOUNTER — Telehealth: Payer: Medicare HMO | Admitting: Pulmonary Disease

## 2023-03-08 NOTE — Telephone Encounter (Signed)
Appt has been scheduled and patient aware of the appt

## 2023-03-08 NOTE — Telephone Encounter (Signed)
Patient is requesting a CT Scan before her appointment with Dr.Icard in January. No active requests are in for a CT Scan.

## 2023-04-27 ENCOUNTER — Encounter (HOSPITAL_BASED_OUTPATIENT_CLINIC_OR_DEPARTMENT_OTHER): Payer: Self-pay | Admitting: Emergency Medicine

## 2023-04-27 ENCOUNTER — Other Ambulatory Visit: Payer: Self-pay

## 2023-04-27 ENCOUNTER — Emergency Department (HOSPITAL_BASED_OUTPATIENT_CLINIC_OR_DEPARTMENT_OTHER)
Admission: EM | Admit: 2023-04-27 | Discharge: 2023-04-27 | Disposition: A | Discharge status: Home / Self Care | Payer: Medicare HMO | Attending: Emergency Medicine | Admitting: Emergency Medicine

## 2023-04-27 ENCOUNTER — Emergency Department (HOSPITAL_BASED_OUTPATIENT_CLINIC_OR_DEPARTMENT_OTHER): Payer: Medicare HMO | Admitting: Radiology

## 2023-04-27 DIAGNOSIS — Z79899 Other long term (current) drug therapy: Secondary | ICD-10-CM | POA: Insufficient documentation

## 2023-04-27 DIAGNOSIS — J449 Chronic obstructive pulmonary disease, unspecified: Secondary | ICD-10-CM | POA: Diagnosis not present

## 2023-04-27 DIAGNOSIS — I10 Essential (primary) hypertension: Secondary | ICD-10-CM | POA: Insufficient documentation

## 2023-04-27 DIAGNOSIS — J069 Acute upper respiratory infection, unspecified: Secondary | ICD-10-CM | POA: Diagnosis not present

## 2023-04-27 DIAGNOSIS — Z20822 Contact with and (suspected) exposure to covid-19: Secondary | ICD-10-CM | POA: Insufficient documentation

## 2023-04-27 DIAGNOSIS — E039 Hypothyroidism, unspecified: Secondary | ICD-10-CM | POA: Diagnosis not present

## 2023-04-27 DIAGNOSIS — R0981 Nasal congestion: Secondary | ICD-10-CM | POA: Diagnosis present

## 2023-04-27 DIAGNOSIS — F17200 Nicotine dependence, unspecified, uncomplicated: Secondary | ICD-10-CM | POA: Insufficient documentation

## 2023-04-27 DIAGNOSIS — Z7982 Long term (current) use of aspirin: Secondary | ICD-10-CM | POA: Diagnosis not present

## 2023-04-27 LAB — RESP PANEL BY RT-PCR (RSV, FLU A&B, COVID)  RVPGX2
Influenza A by PCR: NEGATIVE
Influenza B by PCR: NEGATIVE
Resp Syncytial Virus by PCR: NEGATIVE
SARS Coronavirus 2 by RT PCR: NEGATIVE

## 2023-04-27 MED ORDER — DM-GUAIFENESIN ER 30-600 MG PO TB12: 1.0000 | ORAL_TABLET | Freq: Two times a day (BID) | ORAL | 0 refills | Status: DC

## 2023-04-27 NOTE — ED Triage Notes (Signed)
Chills and congestion since 12/24- started theraflu. Complains of ongoing cough. Could not get in to see PCP. Denies CP and SOB.  HX frequent PNA.

## 2023-04-27 NOTE — ED Provider Notes (Signed)
[facility] Provider Note   CSN: [id number] Arrival date & time: 04/27/23  1338     History  Chief Complaint  Patient presents with   Cough    Catherine Lowery is a 77 y.o. female with past medical history of tobacco abuse, HTN, HLD, postablative hypothyroidism, COPD presents emergency department for evaluation of chills, congestion, clear productive cough since 04/24/2023.  She reports that she was going to see her PCP this morning however they recommended ER visit as they could not fit her in today.  She was concerned regarding pneumonia since she had 2 episodes of pneumonia last year.  She has not required use of in her inhaler as she has not had shortness of breath.  She has had TheraFlu and Alka-Seltzer tabs at home without relief.  She denies fevers, nausea, vomiting, diarrhea, chest pain, shortness of breath.    Cough Associated symptoms: chills   Associated symptoms: no chest pain, no fever, no headaches, no shortness of breath and no wheezing      Home Medications Prior to Admission medications   Medication Sig Start Date End Date Taking? Authorizing Provider  dextromethorphan-guaiFENesin (MUCINEX DM) 30-600 MG 12hr tablet Take 1 tablet by mouth 2 (two) times daily. 04/27/23  Yes Judithann Sheen, PA  albuterol (VENTOLIN HFA) 108 (90 Base) MCG/ACT inhaler Inhale 2 puffs into the lungs every 4 (four) hours as needed for wheezing or shortness of breath. 12/23/21   [provider]  amLODipine (NORVASC) 10 MG tablet Take 1 tablet (10 mg total) by mouth daily. 06/06/21   Valentino Nose, NP  anastrozole (ARIMIDEX) 1 MG tablet Take 1 tablet (1 mg total) by mouth daily. 06/02/21   Rachel Moulds, MD  aspirin (ASPIRIN ADULT) 325 MG tablet enteric coated; may use OTC 07/01/19   Lawson Fiscal A, FNP  atorvastatin (LIPITOR) 40 MG tablet Take 1 tablet (40 mg total) by mouth daily. 06/06/21   Valentino Nose, NP  Cholecalciferol  (VITAMIN D3) 50 MCG (2000 UT) capsule Take 1 capsule (2,000 Units total) by mouth daily. 03/04/21   Valentino Nose, NP  levothyroxine (SYNTHROID) 50 MCG tablet Take 1 tablet (50 mcg total) by mouth daily. 06/01/21   Carlus Pavlov, MD  Omega-3 Fatty Acids (FISH OIL PO) Take 1,000 mg by mouth daily.    [provider]  primidone (MYSOLINE) 50 MG tablet Take 2 tablets (100 mg total) by mouth every morning. 06/06/21   Valentino Nose, NP  propranolol (INDERAL) 40 MG tablet Take 0.5 tablets (20 mg total) by mouth 2 (two) times daily. 06/06/21   Valentino Nose, NP  TRELEGY ELLIPTA 100-62.5-25 MCG/ACT AEPB Inhale 1 puff into the lungs daily. 12/23/21   [provider]  VOLTAREN 1 % GEL APPLY TOPICALLY 4 TIMES DAILY AS NEEDED 10/16/16   Salley Scarlet, MD      Allergies    Morphine and codeine, Other, and Percocet [oxycodone-acetaminophen]    Review of Systems   Review of Systems  Constitutional:  Positive for chills. Negative for fatigue and fever.  HENT:  Positive for congestion.   Respiratory:  Positive for cough. Negative for chest tightness, shortness of breath and wheezing.   Cardiovascular:  Negative for chest pain and palpitations.  Gastrointestinal:  Negative for abdominal pain, constipation, diarrhea, nausea and vomiting.  Neurological:  Negative for dizziness, seizures, weakness, light-headedness, numbness and headaches.    Physical Exam Updated Vital Signs BP 125/71  Pulse 63   Temp 98.5 F (36.9 C)   Resp 18   Ht 5' 9.5" (1.765 m)   Wt 63 kg   LMP 01/24/2000   SpO2 100%   BMI 20.23 kg/m  Physical Exam Vitals and nursing note reviewed.  Constitutional:      General: She is not in acute distress.    Appearance: Normal appearance. She is not ill-appearing.  HENT:     Head: Normocephalic and atraumatic.     Right Ear: Tympanic membrane, ear canal and external ear normal.     Left Ear: Tympanic membrane, ear canal and external ear normal.      Nose: Congestion present.     Mouth/Throat:     Mouth: Mucous membranes are moist.     Pharynx: No oropharyngeal exudate or posterior oropharyngeal erythema.     Comments: Uvula midline. No abscess, fluctuance, erythema noted in mouth Eyes:     General: No scleral icterus.       Right eye: No discharge.        Left eye: No discharge.     Extraocular Movements: Extraocular movements intact.     Conjunctiva/sclera: Conjunctivae normal.     Pupils: Pupils are equal, round, and reactive to light.  Cardiovascular:     Rate and Rhythm: Normal rate.     Pulses: Normal pulses.  Pulmonary:     Effort: Pulmonary effort is normal. No respiratory distress.     Breath sounds: Normal breath sounds. No stridor. No wheezing or rhonchi.  Chest:     Chest wall: No tenderness.  Abdominal:     General: There is no distension.     Palpations: Abdomen is soft. There is no mass.     Tenderness: There is no abdominal tenderness. There is no guarding or rebound.  Musculoskeletal:     Cervical back: Normal range of motion and neck supple. No rigidity or tenderness.     Right lower leg: No edema.     Left lower leg: No edema.  Lymphadenopathy:     Cervical: No cervical adenopathy.  Skin:    General: Skin is warm.     Capillary Refill: Capillary refill takes less than 2 seconds.     Coloration: Skin is not jaundiced or pale.  Neurological:     Mental Status: She is alert and oriented to person, place, and time. Mental status is at baseline.    ED Results / Procedures / Treatments   Labs (all labs ordered are listed, but only abnormal results are displayed) Labs Reviewed  RESP PANEL BY RT-PCR (RSV, FLU A&B, COVID)  [id number]    EKG None  Radiology DG Chest 2 View Result Date: 04/27/2023 CLINICAL DATA:  Cough.  Chills and congestion. EXAM: CHEST - 2 VIEW COMPARISON:  03/15/2020. FINDINGS: Redemonstration of irregular opacity overlying the right lateral costophrenic angle, which corresponds to area  of scarring/atelectasis on the prior CT scan from 05/15/2022. Bilateral lungs are otherwise clear. No acute consolidation or lung collapse. Bilateral costophrenic angles are clear. Normal cardio-mediastinal silhouette. No acute osseous abnormalities. The soft tissues are within normal limits. IMPRESSION: No active cardiopulmonary disease. Electronically Signed   By: Jules Schick M.D.   On: 04/27/2023 15:46    Procedures Procedures    Medications Ordered in ED Medications - No data to display  ED Course/ Medical Decision Making/ A&P  Medical Decision Making Amount and/or Complexity of Data Reviewed Radiology: ordered.  Risk OTC drugs.   Patient presents to the ED for concern of URI symptoms, this involves an extensive number of treatment options, and is a complaint that carries with it a high risk of complications and morbidity.  The differential diagnosis includes viral infection, pneumonia, COPD exacerbation.   Co morbidities that complicate the patient evaluation  tobacco abuse, HTN, HLD, postablative hypothyroidism, COPD   Additional history obtained:  Additional history obtained from Nursing and Outside Medical Records   External records from outside source obtained and reviewed including  Triage RN note Recent medical evaluation and medication list   Lab Tests:  I Ordered, and personally interpreted labs.  The pertinent results include:   Negative respiratory panel   Imaging Studies ordered:  I ordered imaging studies including CXR  I independently visualized and interpreted imaging which showed no acute cardiopulmonary changes I agree with the radiologist interpretation    Medicines ordered and prescription drug management:  I ordered medication including Mucinex DM  for URI symptoms  Reevaluation of the patient after these medicines showed that the patient improved I have reviewed the patients home medicines and have  made adjustments as needed    Problem List / ED Course:  URI Very reassuring physical exam and very well-appearing patient.  Lung sounds CTAB Respiratory panel negative.  Chest x-ray negative for acute findings Will provide dexamethasone for pain and prescription for symptoms Discussed return to emergency department precautions with patient who expresses understanding agrees with plan.  All questions answered to her satisfaction.  She is agreeable to discharge at this time.   Reevaluation:  After the interventions noted above, I reevaluated the patient and found that they have :improved   Social Determinants of Health:  Has PCP F/U   Dispostion:  After consideration of the diagnostic results and the patients response to treatment, I feel that the patent would benefit from outpatient management.   Final Clinical Impression(s) / ED Diagnoses Final diagnoses:  None    Rx / DC Orders ED Discharge Orders          Ordered    dextromethorphan-guaiFENesin [facility] DM) 30-600 MG 12hr tablet  2 times daily        04/27/23 1626              Judithann Sheen, [place] 04/27/23 1636

## 2023-04-27 NOTE — Discharge Instructions (Signed)
Thank you for letting us evaluate you today.  Your respiratory panel was negative for RSV, flu, COVID.  Your x-ray was clear.  I have sent Mucinex DM to your pharmacy.   Please return to emergency department if you experience worsening symptoms, chest pain, shortness of breath.

## 2023-05-07 ENCOUNTER — Ambulatory Visit (HOSPITAL_COMMUNITY): Payer: Medicare HMO

## 2023-05-08 ENCOUNTER — Encounter (HOSPITAL_COMMUNITY): Payer: Self-pay

## 2023-05-08 ENCOUNTER — Emergency Department (HOSPITAL_COMMUNITY): Payer: Medicare HMO

## 2023-05-08 ENCOUNTER — Inpatient Hospital Stay (HOSPITAL_COMMUNITY): Payer: Medicare HMO

## 2023-05-08 ENCOUNTER — Ambulatory Visit (HOSPITAL_COMMUNITY)
Admission: EM | Admit: 2023-05-08 | Discharge: 2023-05-08 | Disposition: A | Discharge status: Home / Self Care | Payer: Medicare HMO | Attending: Family Medicine | Admitting: Family Medicine

## 2023-05-08 ENCOUNTER — Encounter (HOSPITAL_COMMUNITY): Payer: Self-pay | Admitting: Emergency Medicine

## 2023-05-08 ENCOUNTER — Inpatient Hospital Stay (HOSPITAL_COMMUNITY)
Admission: EM | Admit: 2023-05-08 | Discharge: 2023-05-10 | DRG: 749 | Disposition: A | Discharge status: Home / Self Care | Payer: Medicare HMO | Attending: Internal Medicine | Admitting: Internal Medicine

## 2023-05-08 ENCOUNTER — Other Ambulatory Visit: Payer: Self-pay

## 2023-05-08 DIAGNOSIS — E559 Vitamin D deficiency, unspecified: Secondary | ICD-10-CM | POA: Diagnosis present

## 2023-05-08 DIAGNOSIS — I129 Hypertensive chronic kidney disease with stage 1 through stage 4 chronic kidney disease, or unspecified chronic kidney disease: Secondary | ICD-10-CM | POA: Diagnosis present

## 2023-05-08 DIAGNOSIS — Z8249 Family history of ischemic heart disease and other diseases of the circulatory system: Secondary | ICD-10-CM

## 2023-05-08 DIAGNOSIS — Z853 Personal history of malignant neoplasm of breast: Secondary | ICD-10-CM | POA: Diagnosis not present

## 2023-05-08 DIAGNOSIS — F17210 Nicotine dependence, cigarettes, uncomplicated: Secondary | ICD-10-CM | POA: Insufficient documentation

## 2023-05-08 DIAGNOSIS — N1831 Chronic kidney disease, stage 3a: Secondary | ICD-10-CM | POA: Diagnosis present

## 2023-05-08 DIAGNOSIS — Z885 Allergy status to narcotic agent status: Secondary | ICD-10-CM

## 2023-05-08 DIAGNOSIS — D4959 Neoplasm of unspecified behavior of other genitourinary organ: Secondary | ICD-10-CM

## 2023-05-08 DIAGNOSIS — C786 Secondary malignant neoplasm of retroperitoneum and peritoneum: Secondary | ICD-10-CM | POA: Diagnosis present

## 2023-05-08 DIAGNOSIS — R109 Unspecified abdominal pain: Secondary | ICD-10-CM

## 2023-05-08 DIAGNOSIS — J439 Emphysema, unspecified: Secondary | ICD-10-CM | POA: Diagnosis present

## 2023-05-08 DIAGNOSIS — K5900 Constipation, unspecified: Secondary | ICD-10-CM | POA: Diagnosis present

## 2023-05-08 DIAGNOSIS — Z808 Family history of malignant neoplasm of other organs or systems: Secondary | ICD-10-CM

## 2023-05-08 DIAGNOSIS — Z79811 Long term (current) use of aromatase inhibitors: Secondary | ICD-10-CM | POA: Diagnosis not present

## 2023-05-08 DIAGNOSIS — I700 Atherosclerosis of aorta: Secondary | ICD-10-CM | POA: Diagnosis present

## 2023-05-08 DIAGNOSIS — Z79890 Hormone replacement therapy (postmenopausal): Secondary | ICD-10-CM | POA: Diagnosis not present

## 2023-05-08 DIAGNOSIS — Z823 Family history of stroke: Secondary | ICD-10-CM | POA: Diagnosis not present

## 2023-05-08 DIAGNOSIS — E039 Hypothyroidism, unspecified: Secondary | ICD-10-CM | POA: Diagnosis present

## 2023-05-08 DIAGNOSIS — Z7982 Long term (current) use of aspirin: Secondary | ICD-10-CM | POA: Diagnosis not present

## 2023-05-08 DIAGNOSIS — Z79899 Other long term (current) drug therapy: Secondary | ICD-10-CM

## 2023-05-08 DIAGNOSIS — R1032 Left lower quadrant pain: Secondary | ICD-10-CM | POA: Diagnosis not present

## 2023-05-08 DIAGNOSIS — Z96642 Presence of left artificial hip joint: Secondary | ICD-10-CM | POA: Diagnosis present

## 2023-05-08 DIAGNOSIS — I10 Essential (primary) hypertension: Secondary | ICD-10-CM | POA: Diagnosis present

## 2023-05-08 DIAGNOSIS — C7962 Secondary malignant neoplasm of left ovary: Principal | ICD-10-CM | POA: Diagnosis present

## 2023-05-08 DIAGNOSIS — E785 Hyperlipidemia, unspecified: Secondary | ICD-10-CM | POA: Diagnosis present

## 2023-05-08 DIAGNOSIS — R1900 Intra-abdominal and pelvic swelling, mass and lump, unspecified site: Principal | ICD-10-CM

## 2023-05-08 DIAGNOSIS — Z8041 Family history of malignant neoplasm of ovary: Secondary | ICD-10-CM

## 2023-05-08 DIAGNOSIS — Z66 Do not resuscitate: Secondary | ICD-10-CM | POA: Diagnosis present

## 2023-05-08 DIAGNOSIS — J4489 Other specified chronic obstructive pulmonary disease: Secondary | ICD-10-CM | POA: Diagnosis present

## 2023-05-08 DIAGNOSIS — C772 Secondary and unspecified malignant neoplasm of intra-abdominal lymph nodes: Secondary | ICD-10-CM | POA: Diagnosis present

## 2023-05-08 DIAGNOSIS — Z8042 Family history of malignant neoplasm of prostate: Secondary | ICD-10-CM

## 2023-05-08 DIAGNOSIS — C562 Malignant neoplasm of left ovary: Secondary | ICD-10-CM

## 2023-05-08 DIAGNOSIS — Z803 Family history of malignant neoplasm of breast: Secondary | ICD-10-CM

## 2023-05-08 DIAGNOSIS — J45909 Unspecified asthma, uncomplicated: Secondary | ICD-10-CM

## 2023-05-08 DIAGNOSIS — Z8679 Personal history of other diseases of the circulatory system: Secondary | ICD-10-CM | POA: Diagnosis not present

## 2023-05-08 DIAGNOSIS — Z807 Family history of other malignant neoplasms of lymphoid, hematopoietic and related tissues: Secondary | ICD-10-CM

## 2023-05-08 DIAGNOSIS — G250 Essential tremor: Secondary | ICD-10-CM | POA: Diagnosis present

## 2023-05-08 DIAGNOSIS — R1030 Lower abdominal pain, unspecified: Secondary | ICD-10-CM | POA: Diagnosis not present

## 2023-05-08 DIAGNOSIS — N183 Chronic kidney disease, stage 3 unspecified: Secondary | ICD-10-CM | POA: Diagnosis not present

## 2023-05-08 HISTORY — DX: Unspecified asthma, uncomplicated: J45.909

## 2023-05-08 LAB — COMPREHENSIVE METABOLIC PANEL
ALT: 12 U/L (ref 0–44)
AST: 28 U/L (ref 15–41)
Albumin: 3.6 g/dL (ref 3.5–5.0)
Alkaline Phosphatase: 60 U/L (ref 38–126)
Anion gap: 11 (ref 5–15)
BUN: 19 mg/dL (ref 8–23)
CO2: 23 mmol/L (ref 22–32)
Calcium: 9.7 mg/dL (ref 8.9–10.3)
Chloride: 104 mmol/L (ref 98–111)
Creatinine, Ser: 0.98 mg/dL (ref 0.44–1.00)
GFR, Estimated: 59 mL/min — ABNORMAL LOW (ref >60)
Glucose, Bld: 113 mg/dL — ABNORMAL HIGH (ref 70–99)
Potassium: 4.4 mmol/L (ref 3.5–5.1)
Sodium: 138 mmol/L (ref 135–145)
Total Bilirubin: 0.8 mg/dL (ref 0.0–1.2)
Total Protein: 7.9 g/dL (ref 6.5–8.1)

## 2023-05-08 LAB — CBC
HCT: 38.6 % (ref 36.0–46.0)
Hemoglobin: 12.7 g/dL (ref 12.0–15.0)
MCH: 23 pg — ABNORMAL LOW (ref 26.0–34.0)
MCHC: 32.9 g/dL (ref 30.0–36.0)
MCV: 69.8 fL — ABNORMAL LOW (ref 80.0–100.0)
Platelets: 363 10*3/uL (ref 150–400)
RBC: 5.53 MIL/uL — ABNORMAL HIGH (ref 3.87–5.11)
RDW: 14.4 % (ref 11.5–15.5)
WBC: 6.8 10*3/uL (ref 4.0–10.5)
nRBC: 0 % (ref 0.0–0.2)

## 2023-05-08 LAB — LIPASE, BLOOD: Lipase: 32 U/L (ref 11–51)

## 2023-05-08 MED ORDER — IOHEXOL 350 MG/ML SOLN
60.0000 mL | Freq: Once | INTRAVENOUS | Status: AC | PRN
  Administered 2023-05-09: 60 mL via INTRAVENOUS

## 2023-05-08 MED ORDER — ONDANSETRON HCL 4 MG PO TABS: 4.0000 mg | ORAL_TABLET | Freq: Four times a day (QID) | ORAL | Status: DC | PRN

## 2023-05-08 MED ORDER — SODIUM CHLORIDE 0.9% FLUSH: 3.0000 mL | INTRAVENOUS | Status: DC | PRN

## 2023-05-08 MED ORDER — ONDANSETRON HCL 4 MG/2ML IJ SOLN
4.0000 mg | Freq: Once | INTRAMUSCULAR | Status: AC
  Administered 2023-05-08: 4 mg via INTRAVENOUS
  Filled 2023-05-08: qty 2

## 2023-05-08 MED ORDER — IOHEXOL 350 MG/ML SOLN
75.0000 mL | Freq: Once | INTRAVENOUS | Status: AC | PRN
  Administered 2023-05-08: 50 mL via INTRAVENOUS

## 2023-05-08 MED ORDER — PANTOPRAZOLE SODIUM 40 MG IV SOLR
40.0000 mg | Freq: Once | INTRAVENOUS | Status: AC
  Administered 2023-05-08: 40 mg via INTRAVENOUS
  Filled 2023-05-08: qty 10

## 2023-05-08 MED ORDER — PANTOPRAZOLE SODIUM 40 MG PO TBEC
40.0000 mg | DELAYED_RELEASE_TABLET | Freq: Every day | ORAL | Status: DC
  Administered 2023-05-09 – 2023-05-10 (×2): 40 mg via ORAL
  Filled 2023-05-08 (×2): qty 1

## 2023-05-08 MED ORDER — ONDANSETRON 4 MG PO TBDP: 4.0000 mg | ORAL_TABLET | Freq: Once | ORAL | Status: DC

## 2023-05-08 MED ORDER — PREDNISONE 10 MG PO TABS
10.0000 mg | ORAL_TABLET | Freq: Every day | ORAL | Status: AC
  Administered 2023-05-09: 10 mg via ORAL
  Filled 2023-05-08: qty 1

## 2023-05-08 MED ORDER — ATORVASTATIN CALCIUM 40 MG PO TABS
40.0000 mg | ORAL_TABLET | Freq: Every day | ORAL | Status: DC
  Administered 2023-05-09 – 2023-05-10 (×2): 40 mg via ORAL
  Filled 2023-05-08 (×2): qty 1

## 2023-05-08 MED ORDER — SODIUM CHLORIDE 0.9% FLUSH
3.0000 mL | Freq: Two times a day (BID) | INTRAVENOUS | Status: DC
  Administered 2023-05-08 – 2023-05-10 (×4): 3 mL via INTRAVENOUS

## 2023-05-08 MED ORDER — HYDRALAZINE HCL 20 MG/ML IJ SOLN: 5.0000 mg | Freq: Three times a day (TID) | INTRAMUSCULAR | Status: DC | PRN

## 2023-05-08 MED ORDER — ONDANSETRON HCL 4 MG/2ML IJ SOLN: 4.0000 mg | Freq: Four times a day (QID) | INTRAMUSCULAR | Status: DC | PRN

## 2023-05-08 MED ORDER — FENTANYL CITRATE PF 50 MCG/ML IJ SOSY: 50.0000 ug | PREFILLED_SYRINGE | INTRAMUSCULAR | Status: DC | PRN

## 2023-05-08 MED ORDER — LEVOTHYROXINE SODIUM 50 MCG PO TABS
50.0000 ug | ORAL_TABLET | Freq: Every day | ORAL | Status: DC
  Administered 2023-05-09 – 2023-05-10 (×2): 50 ug via ORAL
  Filled 2023-05-08 (×2): qty 1

## 2023-05-08 MED ORDER — PRIMIDONE 50 MG PO TABS: 100.0000 mg | ORAL_TABLET | Freq: Every morning | ORAL | Status: DC

## 2023-05-08 MED ORDER — ANASTROZOLE 1 MG PO TABS
1.0000 mg | ORAL_TABLET | Freq: Every day | ORAL | Status: DC
Start: 2023-05-09 — End: 2023-05-10
  Administered 2023-05-09 – 2023-05-10 (×2): 1 mg via ORAL
  Filled 2023-05-08 (×2): qty 1

## 2023-05-08 MED ORDER — HEPARIN SODIUM (PORCINE) 5000 UNIT/ML IJ SOLN
5000.0000 [IU] | Freq: Three times a day (TID) | INTRAMUSCULAR | Status: DC
  Administered 2023-05-09: 5000 [IU] via SUBCUTANEOUS
  Filled 2023-05-08: qty 1

## 2023-05-08 MED ORDER — NICOTINE 14 MG/24HR TD PT24
14.0000 mg | MEDICATED_PATCH | Freq: Every day | TRANSDERMAL | Status: DC
  Administered 2023-05-08: 14 mg via TRANSDERMAL
  Filled 2023-05-08: qty 1

## 2023-05-08 MED ORDER — POLYETHYLENE GLYCOL 3350 17 G PO PACK
17.0000 g | PACK | Freq: Every day | ORAL | Status: DC
  Administered 2023-05-09: 17 g via ORAL
  Filled 2023-05-08: qty 1

## 2023-05-08 MED ORDER — AMLODIPINE BESYLATE 10 MG PO TABS: 10.0000 mg | ORAL_TABLET | Freq: Every day | ORAL | Status: DC

## 2023-05-08 MED ORDER — LOSARTAN POTASSIUM 25 MG PO TABS: 25.0000 mg | ORAL_TABLET | Freq: Every day | ORAL | Status: DC

## 2023-05-08 MED ORDER — FENTANYL CITRATE PF 50 MCG/ML IJ SOSY
12.5000 ug | PREFILLED_SYRINGE | INTRAMUSCULAR | Status: DC | PRN
  Administered 2023-05-09 (×2): 25 ug via INTRAVENOUS
  Filled 2023-05-08 (×2): qty 1

## 2023-05-08 MED ORDER — SENNOSIDES-DOCUSATE SODIUM 8.6-50 MG PO TABS
1.0000 | ORAL_TABLET | Freq: Two times a day (BID) | ORAL | Status: DC
  Administered 2023-05-08 – 2023-05-10 (×4): 1 via ORAL
  Filled 2023-05-08 (×4): qty 1

## 2023-05-08 MED ORDER — IBUPROFEN 400 MG PO TABS
400.0000 mg | ORAL_TABLET | Freq: Four times a day (QID) | ORAL | Status: DC | PRN
  Administered 2023-05-09: 400 mg via ORAL
  Filled 2023-05-08: qty 1

## 2023-05-08 MED ORDER — NICOTINE POLACRILEX 2 MG MT GUM: 2.0000 mg | CHEWING_GUM | OROMUCOSAL | Status: DC | PRN

## 2023-05-08 MED ORDER — ACETAMINOPHEN 325 MG PO TABS
650.0000 mg | ORAL_TABLET | Freq: Once | ORAL | Status: AC
  Administered 2023-05-08: 650 mg via ORAL
  Filled 2023-05-08: qty 2

## 2023-05-08 MED ORDER — PREDNISONE 5 MG PO TABS: 10.0000 mg | ORAL_TABLET | Freq: Every day | ORAL | Status: DC

## 2023-05-08 MED ORDER — DM-GUAIFENESIN ER 30-600 MG PO TB12: 1.0000 | ORAL_TABLET | Freq: Two times a day (BID) | ORAL | Status: DC

## 2023-05-08 MED ORDER — DOXYCYCLINE HYCLATE 100 MG PO TABS
100.0000 mg | ORAL_TABLET | Freq: Two times a day (BID) | ORAL | Status: AC
  Administered 2023-05-08 – 2023-05-10 (×4): 100 mg via ORAL
  Filled 2023-05-08 (×4): qty 1

## 2023-05-08 MED ORDER — KETOROLAC TROMETHAMINE 15 MG/ML IJ SOLN
7.5000 mg | Freq: Once | INTRAMUSCULAR | Status: AC
  Administered 2023-05-08: 7.5 mg via INTRAMUSCULAR
  Filled 2023-05-08: qty 1

## 2023-05-08 MED ORDER — ALBUTEROL SULFATE (2.5 MG/3ML) 0.083% IN NEBU: 2.5000 mg | INHALATION_SOLUTION | RESPIRATORY_TRACT | Status: DC | PRN

## 2023-05-08 MED ORDER — SODIUM CHLORIDE 0.9 % IV SOLN: 250.0000 mL | INTRAVENOUS | Status: DC | PRN

## 2023-05-08 NOTE — H&P (Signed)
 History and Physical    Catherine Lowery FMW:[id number] DOB: 1946/01/23 DOA: 05/08/2023  PCP: [name], NP   Patient coming from: Home   Chief Complaint:  Chief Complaint  Patient presents with   Constipation   Abdominal Pain   ED TRIAGE note:Patient reports that she was having a cough and nasal congestion on 12/27 and was seen at [facility] ER. Patient states that she was told to take OTC medications for her symptoms. Patient then states she had diarrhea on 12/30. Patient states she was prescribed Prednisone  and doxycycline  which she did not start taking until 05/03/23.   Patient now reports that she still has a cough, but has not had a BM since having diarrhea on 12/30 and is having abdominal cramping.  HPI:  Catherine Lowery is a 78 y.o. female with medical history significant of history of CKD stage III, history of breast cancer on Arimidex , essential hypertension, reactive airway disease, and chronic vitamin D  deficiency presented emergency department with multiple complaint including cough, nasal congestion since 12/27, diarrhea until 12/30 now having constipation and abdominal cramping.  Patient reported to prednisone  and doxycycline  which helped with improvement of nasal congestion and cough.  Reported that she has noticed left-sided lower abdominal pain for last 2 weeks.  Also reported associated constipation for last 6 to 7 days.  Patient denies any nausea, vomiting.  Abdominal cramping.  She also has poor appetite, generalized weakness and lost weight. Patient denies any fever, chill, chest pain and shortness of breath.  ED Course:  At presentation to ED patient found hypertensive blood pressure 161/83 otherwise hemodynamically stable. CBC showing low MCV 69 which is chronic in nature.  Otherwise unremarkable. CMP showing evidence of CKD stage IIIa otherwise normal finding. Pending UA.  CT abdomen pelvis showed: IMPRESSION: 1. Large heterogeneous cystic and solid left  adnexal mass highly suspicious for ovarian neoplasm. 2. Positive for peritoneal metastatic disease as evidence by multiple intra-abdominal and pelvic tumor implants. Trace volume ascites in the pelvis. Several mildly enlarged retroperitoneal lymph nodes also suspicious for metastatic disease. 3. Diverticular disease of the left colon without acute inflammatory process. 4. Aortic atherosclerosis.  Due to significant abdominal pain in the ED patient has been DC'd fentanyl  and Toradol  dose. ED for reported that patient has history of aortic aneurysm which is 3 cm size.  Given patient has a new finding of ovarian neoplasm planning to obtaining CT chest without contrast to follow-up with any metastasis and aortic aneurysm as well.  Hospital has been contacted for further evaluation management of inpatient ovarian cancer workup and pain management.  I have consulted and informed Dr. Hoy Masters over phone.  Stated that will order serum tumor marker and oncology team will evaluate patient in the a.m.  Significant labs in the ED: Lab Orders         Lipase, blood         Comprehensive metabolic panel         CBC         Urinalysis, Routine w reflex microscopic -Urine, Clean Catch         Comprehensive metabolic panel         CBC         CA 125         Cancer antigen 19-9         Cancer antigen 27.29         CEA         Vitamin B12  Folate         Iron and TIBC         Ferritin         Reticulocytes         Occult blood card to lab, stool       Review of Systems:  Review of Systems  Constitutional:  Positive for malaise/fatigue and weight loss. Negative for chills and fever.  Respiratory:  Negative for cough, sputum production and shortness of breath.   Cardiovascular:  Negative for chest pain and palpitations.  Gastrointestinal:  Positive for abdominal pain and constipation. Negative for diarrhea, heartburn, nausea and vomiting.  Genitourinary:  Negative for dysuria,  frequency and urgency.  Musculoskeletal:  Negative for back pain, falls, joint pain, myalgias and neck pain.  Neurological:  Negative for dizziness and headaches.  Psychiatric/Behavioral:  The patient is not nervous/anxious.     Past Medical History:  Diagnosis Date   Cancer (HCC) 03/2018   right breast cancer   Chronic kidney disease    CKD   DJD (degenerative joint disease)    LEFT HIP   Family history of breast cancer    Family history of ovarian cancer    Family history of prostate cancer    Family history of prostate cancer    H/O migraine 07/01/2019   Hyperlipidemia    takes Simvasatin daily   Hypertension    takes Propranolol  and HCTZ daioly   Hyperthyroidism    Pneumonia 10/2015   PONV (postoperative nausea and vomiting)    when ether used   Reactive airway disease 05/08/2023   Shortness of breath dyspnea    with ambulation   Tremors of nervous system    takes Primidone  daily    Past Surgical History:  Procedure Laterality Date   BOne Spur     left heel   BREAST LUMPECTOMY WITH RADIOACTIVE SEED AND SENTINEL LYMPH NODE BIOPSY Right 05/07/2018   Procedure: RIGHT BREAST LUMPECTOMY WITH RADIOACTIVE SEED AND RIGHT  SENTINEL LYMPH NODE MAPPING;  Surgeon: Vanderbilt Ned, MD;  Location: [facility];  Service: General;  Laterality: Right;   COLONOSCOPY     JOINT REPLACEMENT Bilateral    knee   KNEE SURGERY     RE-EXCISION OF BREAST LUMPECTOMY Right 06/05/2018   Procedure: RE-EXCISION OF RIGHT BREAST LUMPECTOMY;  Surgeon: Vanderbilt Ned, MD;  Location: [facility];  Service: General;  Laterality: Right;   ROTATOR CUFF REPAIR Bilateral    TOTAL HIP ARTHROPLASTY Left 12/29/2015   TOTAL HIP ARTHROPLASTY Left 12/29/2015   Procedure: TOTAL HIP ARTHROPLASTY ANTERIOR APPROACH;  Surgeon: Toribio JULIANNA Chancy, MD;  Location: [facility] OR;  Service: Orthopedics;  Laterality: Left;     reports that she has been smoking cigarettes. She has a 21 pack-year smoking  history. She has never used smokeless tobacco. She reports that she does not drink alcohol and does not use drugs.  Allergies  Allergen Reactions   Morphine And Codeine Itching   Other Itching and Nausea And Vomiting    PAIN MEDICINES   Percocet [Oxycodone -Acetaminophen ] Nausea And Vomiting    Family History  Problem Relation Age of Onset   Hypertension Mother    Heart disease Mother    Stroke Mother    Multiple myeloma Father 25   Thyroid  cancer Sister        dx 38's/50's   Cancer Paternal Aunt        unknown   Prostate cancer Paternal Uncle  dx >50- cancer was the cause of his death   Ovarian cancer Paternal Aunt    Throat cancer Paternal Uncle    Breast cancer Cousin        dx >50   Cancer Cousin        type unk   Cancer Cousin        type unk   Cancer Cousin        type unk    Prior to Admission medications   Medication Sig Start Date End Date Taking? Authorizing Provider  albuterol  (VENTOLIN  HFA) 108 (90 Base) MCG/ACT inhaler Inhale 2 puffs into the lungs every 4 (four) hours as needed for wheezing or shortness of breath. 12/23/21  Yes [provider]  amLODipine  (NORVASC ) 10 MG tablet Take 1 tablet (10 mg total) by mouth daily. 06/06/21  Yes Chandra Raisin A, NP  anastrozole  (ARIMIDEX ) 1 MG tablet Take 1 tablet (1 mg total) by mouth daily. 06/02/21  Yes Iruku, Praveena, MD  aspirin  (ASPIRIN  ADULT) 325 MG tablet enteric coated; may use OTC Patient taking differently: Take 325 mg by mouth daily. enteric coated; may use OTC 07/01/19  Yes Bates, Crystal A, FNP  atorvastatin  (LIPITOR) 40 MG tablet Take 1 tablet (40 mg total) by mouth daily. 06/06/21  Yes Chandra Raisin LABOR, NP  Cholecalciferol  (VITAMIN D3) 50 MCG (2000 UT) capsule Take 1 capsule (2,000 Units total) by mouth daily. 03/04/21  Yes Chandra Raisin LABOR, NP  doxycycline  (MONODOX ) 100 MG capsule Take 100 mg by mouth 2 (two) times daily. 05/03/23  Yes [provider]  levothyroxine  (SYNTHROID )  50 MCG tablet Take 1 tablet (50 mcg total) by mouth daily. 06/01/21  Yes Trixie File, MD  losartan  (COZAAR ) 25 MG tablet Take 25 mg by mouth daily. 03/08/23  Yes [provider]  Omega-3 Fatty Acids (FISH OIL PO) Take 1,000 mg by mouth daily.   Yes [provider]  predniSONE  (DELTASONE ) 10 MG tablet Take by mouth. Take 6 tablets on day 1 and take are directed for 6 days 05/03/23  Yes [provider]  primidone  (MYSOLINE ) 50 MG tablet Take 2 tablets (100 mg total) by mouth every morning. 06/06/21  Yes Chandra Raisin LABOR, NP  propranolol  (INDERAL ) 40 MG tablet Take 0.5 tablets (20 mg total) by mouth 2 (two) times daily. Patient taking differently: Take 20 mg by mouth daily. 06/06/21  Yes Chandra Raisin A, NP  TRELEGY ELLIPTA 100-62.5-25 MCG/ACT AEPB Inhale 1 puff into the lungs daily. 12/23/21  Yes [provider]  VOLTAREN 1 % GEL APPLY TOPICALLY 4 TIMES DAILY AS NEEDED 10/16/16  Yes Telford, Theodoro FALCON, MD  dextromethorphan-guaiFENesin  (MUCINEX  DM) 30-600 MG 12hr tablet Take 1 tablet by mouth 2 (two) times daily. Patient not taking: Reported on 05/08/2023 04/27/23   Minnie Tinnie BRAVO, PA     Physical Exam: Vitals:   05/08/23 2200 05/08/23 2230 05/08/23 2331 05/09/23 0033  BP: (!) 161/83 (!) 147/95 (!) 147/79 (!) 154/83  Pulse: 60 (!) 59 76 71  Resp: 20 14 (!) 23 20  Temp:   98.2 F (36.8 C) 98.2 F (36.8 C)  TempSrc:   Oral Oral  SpO2: 99% 98% 97% 100%  Weight:    57.7 kg    Physical Exam Vitals and nursing note reviewed.  Constitutional:      General: She is not in acute distress.    Appearance: She is ill-appearing.  Cardiovascular:     Rate and Rhythm: Normal rate and regular rhythm.  Pulmonary:     Effort: Pulmonary effort is normal.     Breath sounds: Normal breath sounds.  Abdominal:     General: Abdomen is flat and scaphoid. Bowel sounds are normal.     Palpations: Abdomen is soft. There is no shifting dullness.     Tenderness: There is  abdominal tenderness in the left lower quadrant. There is no guarding or rebound.     Hernia: No hernia is present.  Skin:    General: Skin is dry.     Capillary Refill: Capillary refill takes less than 2 seconds.  Neurological:     General: No focal deficit present.     Mental Status: She is alert.  Psychiatric:        Mood and Affect: Mood normal.        Behavior: Behavior normal.      Labs on Admission: I have personally reviewed following labs and imaging studies  CBC: Recent Labs  Lab 05/08/23 1317  WBC 6.8  HGB 12.7  HCT 38.6  MCV 69.8*  PLT 363   Basic Metabolic Panel: Recent Labs  Lab 05/08/23 1317  NA 138  K 4.4  CL 104  CO2 23  GLUCOSE 113*  BUN 19  CREATININE 0.98  CALCIUM  9.7   GFR: Estimated Creatinine Clearance: 42.4 mL/min (by C-G formula based on SCr of 0.98 mg/dL). Liver Function Tests: Recent Labs  Lab 05/08/23 1317  AST 28  ALT 12  ALKPHOS 60  BILITOT 0.8  PROT 7.9  ALBUMIN  3.6   Recent Labs  Lab 05/08/23 1317  LIPASE 32   No results for input(s): AMMONIA in the last 168 hours. Coagulation Profile: No results for input(s): INR, PROTIME in the last 168 hours. Cardiac Enzymes: No results for input(s): CKTOTAL, CKMB, CKMBINDEX, TROPONINI, TROPONINIHS in the last 168 hours. BNP (last 3 results) No results for input(s): BNP in the last 8760 hours. HbA1C: No results for input(s): HGBA1C in the last 72 hours. CBG: No results for input(s): GLUCAP in the last 168 hours. Lipid Profile: No results for input(s): CHOL, HDL, LDLCALC, TRIG, CHOLHDL, LDLDIRECT in the last 72 hours. Thyroid  Function Tests: No results for input(s): TSH, T4TOTAL, FREET4, T3FREE, THYROIDAB in the last 72 hours. Anemia Panel: No results for input(s): VITAMINB12, FOLATE, FERRITIN, TIBC, IRON, RETICCTPCT in the last 72 hours. Urine analysis:    Component Value Date/Time   COLORURINE YELLOW 09/21/2017 1533    APPEARANCEUR CLOUDY (A) 09/21/2017 1533   LABSPEC 1.015 09/21/2017 1533   PHURINE 5.5 09/21/2017 1533   GLUCOSEU NEGATIVE 09/21/2017 1533   HGBUR TRACE (A) 09/21/2017 1533   BILIRUBINUR SMALL (A) 03/24/2011 1054   KETONESUR NEGATIVE 09/21/2017 1533   PROTEINUR NEGATIVE 09/21/2017 1533   UROBILINOGEN 0.2 03/24/2011 1054   NITRITE NEGATIVE 09/21/2017 1533   LEUKOCYTESUR TRACE (A) 09/21/2017 1533    Radiological Exams on Admission: I have personally reviewed images CT Angio Chest Aorta W and/or Wo Contrast Result Date: 05/09/2023 CLINICAL DATA:  Constipation and abdominal pain. Acute aortic syndrome suspected. EXAM: CT ANGIOGRAPHY CHEST WITH CONTRAST TECHNIQUE: Multidetector CT imaging of the chest was performed using the standard protocol during bolus administration of intravenous contrast. Multiplanar CT image reconstructions and MIPs were obtained to evaluate the vascular anatomy. RADIATION DOSE REDUCTION: This exam was performed according to the departmental dose-optimization program which includes automated exposure control, adjustment of the mA and/or kV according to patient size and/or use of iterative reconstruction technique. CONTRAST:  60mL OMNIPAQUE  IOHEXOL  350 MG/ML  SOLN COMPARISON:  Radiograph 04/27/2023 and CT chest 05/15/2022 FINDINGS: Cardiovascular: Normal heart size. No pericardial effusion. Aortic atherosclerotic calcification. No aortic aneurysm, dissection, or penetrating atherosclerotic ulcer. Coronary artery calcification. Mediastinum/Nodes: Trachea and esophagus are unremarkable. No thoracic adenopathy. Lungs/Pleura: Centrilobular emphysema greatest in the apices. Bronchial wall thickening and mucous plugging in the lower lobes. Right lower lobe atelectasis. Clustered centrilobular micro nodules and tree-in-bud opacities in the right upper lobe. No pleural effusion or pneumothorax. Upper Abdomen: No acute abnormality. Musculoskeletal: No acute fracture.  Thoracic spondylosis.  Review of the MIP images confirms the above findings. IMPRESSION: 1. No aortic aneurysm or dissection. 2. Emphysema and chronic bronchitis. Aortic Atherosclerosis (ICD10-I70.0) and Emphysema (ICD10-J43.9). Electronically Signed   By: Norman Gatlin M.D.   On: 05/09/2023 00:15   CT ABDOMEN PELVIS W CONTRAST Result Date: 05/08/2023 CLINICAL DATA:  Left lower quadrant pain EXAM: CT ABDOMEN AND PELVIS WITH CONTRAST TECHNIQUE: Multidetector CT imaging of the abdomen and pelvis was performed using the standard protocol following bolus administration of intravenous contrast. RADIATION DOSE REDUCTION: This exam was performed according to the departmental dose-optimization program which includes automated exposure control, adjustment of the mA and/or kV according to patient size and/or use of iterative reconstruction technique. CONTRAST:  50mL OMNIPAQUE  IOHEXOL  350 MG/ML SOLN COMPARISON:  CT 10/30/2015 FINDINGS: Lower chest: Lung bases demonstrate bandlike scarring within the lower lobes and right middle lobe. No acute airspace disease. Hepatobiliary: Subcentimeter hypodensities in the liver too small to further characterize. No calcified gallstone or biliary dilatation. Pancreas: Unremarkable. No pancreatic ductal dilatation or surrounding inflammatory changes. Spleen: Normal in size without focal abnormality. Adrenals/Urinary Tract: Adrenal glands show no focal abnormality. Kidneys show no hydronephrosis. Simple bilateral renal cysts for which no imaging follow-up is recommended. 2.1 cm superior pole exophytic complex cyst. Bladder partially obscured Stomach/Bowel: Stomach nonenlarged. No dilated small bowel. No acute bowel wall thickening. Diverticular disease of the left colon. Vascular/Lymphatic: Advanced aortic atherosclerosis. No aneurysm. Mildly enlarged left Peri aortic node measuring 19 mm on series 3, image 42. Reproductive: Uterus is unremarkable. Heterogenous left adnexal mass lesion measuring 6.8 x 5.8 by 7  cm concerning for neoplasm. Other: Small volume free fluid within the pelvis. Evidence for peritoneal metastatic disease. 10 mm left upper quadrant peritoneal nodule on series 3, image 35. Right mid quadrant soft tissue mass measuring 5.7 x 2.4 cm on series 3, image 44. Left lower quadrant soft tissue mass measuring 8.1 by 2.5 by 12.1 cm. Soft tissue nodule in the right colic gutter measuring 1.7 cm on series 3, image 55. Right posterior pelvic soft tissue mass measuring 3.6 x 2.6 cm on series 3, image 76. Additional small mesenteric nodules are noted. Musculoskeletal: No acute or suspicious osseous abnormality. Left hip replacement with artifact. IMPRESSION: 1. Large heterogeneous cystic and solid left adnexal mass highly suspicious for ovarian neoplasm. 2. Positive for peritoneal metastatic disease as evidence by multiple intra-abdominal and pelvic tumor implants. Trace volume ascites in the pelvis. Several mildly enlarged retroperitoneal lymph nodes also suspicious for metastatic disease. 3. Diverticular disease of the left colon without acute inflammatory process. 4. Aortic atherosclerosis. Aortic Atherosclerosis (ICD10-I70.0). Electronically Signed   By: Luke Bun M.D.   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  She has abdominal cramping which is mostly constant in nature.  Patient reported having diarrhea before 12/30 afterward she developed  constipation. -At presentation to ED patient found hemodynamically stable. -CMP unremarkable, CBC showing stable H&H.  Normal lipase. -CT abdomen pelvis unfortunately showed large heterogenous  cystic and solid left adnexal mass highly suspicious for ovarian neoplasm. Positive for peritoneal metastatic disease as evidence by multiple intra-abdominal and pelvic tumor implants. Trace volume ascites in the pelvis. Several mildly enlarged retroperitoneal lymph nodes also suspicious for metastatic disease. -CT chest did not showed any evidence of metastasis. -Abdominal pain and constipation secondary to above findings. - Consulted and spoke with gynecology-oncology is Dr. Hoy Masters, will evaluate patient in the a.m. and checking  tumor marker. -Continue pain control. - Please inform patient's oncologist Dr. Loretha in the daytime.   Constipation - Constipation secondary to tumor burden. - Continue MiraLAX  and senna twice daily - Encouraging ambulation with assistance.  History of aortic aneurysm-resolved -CT angiogram obtained which did not showed any aortic aneurysm or dissection.  It showed evidence of emphysema and chronic bronchitis.   History of breast cancer -History of breast cancer status post lumpectomy, adjuvant radiation and currently on Arimidex . -Plan to continue Arimidex  1 mg daily. -Please inform patient's oncologist Dr. Loretha in the daytime   History of recent upper respiratory infection - Continue doxycycline  to complete the course on prednisone  10 mg until 05/08/2022.   Essential hypertension -Reported not taking losartan  anymore while he takes amlodipine . - Continue amlodipine  10 mg daily and hydralazine  as needed  History of COPD Stable.  O2 sat 97% on room air. -At home patient is on Trelegy. - Continue Breo Ellipta  and Incruse Ellipta  once daily.  And DuoNeb as needed   Chronic cigarette smoker -Reported not smoking since Sunday.  Counseling given at  the bedside for pulmonary smoking cessation.   -Continue nicotine  patch.   Essential tremor -Reported not taking primidone  anymore.  Currently she is on propranolol  20 mg daily  CKD stage IIIa -Stable renal function.  Continue to monitor.  Hypothyroidism - Continue Synthyroid 50 mcg daily   Chronic microcytic anemia - Checking anemia panel and FOBT. -Holding to start oral iron supplement as patient has significant constipation.  DVT prophylaxis:  SQ Heparin  Code Status:  DNR/DNI(Do NOT Intubate) Diet: Heart healthy diet Family Communication:   Family was present at bedside, at the time of interview. Opportunity was given to ask question and all questions were answered satisfactorily.  Disposition Plan: Waiting for evaluation by GYN oncology. Consults: Gyneoncology Admission status:   Inpatient, Telemetry bed  Severity of Illness: The appropriate patient status for this patient is INPATIENT. Inpatient status is judged to be reasonable and necessary in order to provide the required intensity of service to ensure the patient's safety. The patient's presenting symptoms, physical exam findings, and initial radiographic and laboratory data in the context of their chronic comorbidities is felt to place them at high risk for further clinical deterioration. Furthermore, it is not anticipated that the patient will be medically stable for discharge from the hospital within 2 midnights of admission.   * I certify that at the point of admission it is my clinical judgment that  the patient will require inpatient hospital care spanning beyond 2 midnights from the point of admission due to high intensity of service, high risk for further deterioration and high frequency of surveillance required.DEWAINE    Deandrew Hoecker, MD [facility]  How to contact the [facility] Attending or Consulting provider 7A - 7P or covering provider during after hours 7P -7A, for this patient.  Check the care team in [facility]  and look for a) attending/consulting [facility] provider listed and b) the [facility] team listed Log into [email or web address] and use [facility] universal password to access. If you do not have the password, please contact the hospital operator. Locate the [facility] provider you are looking for under [facility] and page to a number that you can be directly reached. If you still have difficulty reaching the provider, please page the [facility] (Director on Call) for the Hospitalists listed on amion for assistance.  05/09/2023, 1:54 AM

## 2023-05-08 NOTE — ED Triage Notes (Signed)
 Patient reports that she was having a cough and nasal congestion on 12/27 and was seen at [facility] ER. Patient states that she was told to take OTC medications for her symptoms. Patient then states she had diarrhea on 12/30. Patient states she was prescribed Prednisone  and doxycycline  which she did not start taking until 05/03/23.  Patient now reports that she still has a cough, but has not had a BM since having diarrhea on 12/30 and is having abdominal cramping.

## 2023-05-08 NOTE — ED Notes (Signed)
 Patient was given some water to drink. Patient is resting in bed with family at bedside.

## 2023-05-08 NOTE — Discharge Instructions (Signed)
 Patient will go to the emergency room for further evaluation and treatment

## 2023-05-08 NOTE — ED Provider Triage Note (Signed)
 Emergency Medicine Provider Triage Evaluation Note  Catherine Lowery , a 78 y.o. female  was evaluated in triage.  Pt complains of abdominal pain, nausea, anorexia, weight loss and absence of bowel movements.  Last bowel movement was 8 days ago.  It was loose, no Imodium or other medications.  Since that time she has had left lower quadrant abdominal pain, worsening as well as no bowel movements.  Review of Systems  Positive: Pain Negative: No vomiting  Physical Exam  BP 139/78 (BP Location: Right Arm)   Pulse (!) 109   Temp 98.3 F (36.8 C)   Resp 18   Wt 63 kg   LMP 01/24/2000   SpO2 95%   BMI 20.22 kg/m  Gen:   Awake, no distress speaking clearly Resp:  Normal effort no increased work of breathing MSK:   Moves extremities without difficulty no deformity Other:  Abdominal left lower quadrant pain  Medical Decision Making  Medically screening exam initiated at 1:56 PM.  Appropriate orders placed.  EMIKO OSORTO was informed that the remainder of the evaluation will be completed by another provider, this initial triage assessment does not replace that evaluation, and the importance of remaining in the ED until their evaluation is complete.   Garrick Charleston, MD 05/08/23 1357

## 2023-05-08 NOTE — ED Provider Notes (Signed)
 [facility]    CSN: [id number] Arrival date & time: 05/08/23  1058      History   Chief Complaint Chief Complaint  Patient presents with   Abdominal Pain   Constipation   Cough   Nasal Congestion    HPI Catherine Lowery is a 78 y.o. female.    Abdominal Pain Associated symptoms: constipation and cough   Constipation Associated symptoms: abdominal pain   Cough Here for abdominal pain for a few days that is been worsening and now is a 10 out of 10 in the last 2 days.  She has had no bowel movement since December 30.  She had been ill with upper respiratory symptoms and diarrhea prior to that.  She was prescribed prednisone  and doxycycline , apparently by another provider and started taking it January 2.    Past Medical History:  Diagnosis Date   Cancer (HCC) 03/2018   right breast cancer   Chronic kidney disease    CKD   DJD (degenerative joint disease)    LEFT HIP   Family history of breast cancer    Family history of ovarian cancer    Family history of prostate cancer    Family history of prostate cancer    H/O migraine 07/01/2019   Hyperlipidemia    takes Simvasatin daily   Hypertension    takes Propranolol  and HCTZ daioly   Hyperthyroidism    Pneumonia 10/2015   PONV (postoperative nausea and vomiting)    when ether used   Shortness of breath dyspnea    with ambulation   Tremors of nervous system    takes Primidone  daily    Patient Active Problem List   Diagnosis Date Noted   Aromatase inhibitor use 06/02/2021   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   Multiple thyroid  nodules 06/01/2021   H/O migraine 07/01/2019   Bradycardia 12/17/2018   Peripheral edema 12/17/2018   Genetic testing 05/20/2018   Family history of breast cancer    Family history of prostate cancer    Family history of ovarian cancer    Malignant neoplasm of lower-inner quadrant of right breast of female, estrogen receptor positive (HCC) 04/11/2018   DNR (do not  resuscitate) 05/22/2017   Benign familial tremor 05/16/2016   Vitamin D  deficiency 03/01/2016   Primary localized osteoarthritis of left hip 12/29/2015   Encounter for screening colonoscopy 02/18/2015   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 02/04/2015   Hyperlipidemia 01/12/2015   Benign essential HTN 01/12/2015   Tobacco abuse 01/12/2015    Past Surgical History:  Procedure Laterality Date   BOne Spur     left heel   BREAST LUMPECTOMY WITH RADIOACTIVE SEED AND SENTINEL LYMPH NODE BIOPSY Right 05/07/2018   Procedure: RIGHT BREAST LUMPECTOMY WITH RADIOACTIVE SEED AND RIGHT  SENTINEL LYMPH NODE MAPPING;  Surgeon: Vanderbilt Ned, MD;  Location: [facility];  Service: General;  Laterality: Right;   COLONOSCOPY     JOINT REPLACEMENT Bilateral    knee   KNEE SURGERY     RE-EXCISION OF BREAST LUMPECTOMY Right 06/05/2018   Procedure: RE-EXCISION OF RIGHT BREAST LUMPECTOMY;  Surgeon: Vanderbilt Ned, MD;  Location: [facility];  Service: General;  Laterality: Right;   ROTATOR CUFF REPAIR Bilateral    TOTAL HIP ARTHROPLASTY Left 12/29/2015   TOTAL HIP ARTHROPLASTY Left 12/29/2015   Procedure: TOTAL HIP ARTHROPLASTY ANTERIOR APPROACH;  Surgeon: Toribio JULIANNA Chancy, MD;  Location: [facility] OR;  Service: Orthopedics;  Laterality: Left;    OB  History   No obstetric history on file.      Home Medications    Prior to Admission medications   Medication Sig Start Date End Date Taking? Authorizing Provider  albuterol  (VENTOLIN  HFA) 108 (90 Base) MCG/ACT inhaler Inhale 2 puffs into the lungs every 4 (four) hours as needed for wheezing or shortness of breath. 12/23/21   [provider]  amLODipine  (NORVASC ) 10 MG tablet Take 1 tablet (10 mg total) by mouth daily. 06/06/21   Chandra Harlene LABOR, NP  anastrozole  (ARIMIDEX ) 1 MG tablet Take 1 tablet (1 mg total) by mouth daily. 06/02/21   Iruku, Praveena, MD  aspirin  (ASPIRIN  ADULT) 325 MG tablet enteric coated; may use OTC 07/01/19    Carlie Salines A, FNP  atorvastatin  (LIPITOR) 40 MG tablet Take 1 tablet (40 mg total) by mouth daily. 06/06/21   Chandra Harlene LABOR, NP  Cholecalciferol  (VITAMIN D3) 50 MCG (2000 UT) capsule Take 1 capsule (2,000 Units total) by mouth daily. 03/04/21   Chandra Harlene LABOR, NP  dextromethorphan-guaiFENesin  (MUCINEX  DM) 30-600 MG 12hr tablet Take 1 tablet by mouth 2 (two) times daily. 04/27/23   Minnie Tinnie BRAVO, PA  levothyroxine  (SYNTHROID ) 50 MCG tablet Take 1 tablet (50 mcg total) by mouth daily. 06/01/21   Trixie File, MD  Omega-3 Fatty Acids (FISH OIL PO) Take 1,000 mg by mouth daily.    [provider]  primidone  (MYSOLINE ) 50 MG tablet Take 2 tablets (100 mg total) by mouth every morning. 06/06/21   Chandra Harlene LABOR, NP  propranolol  (INDERAL ) 40 MG tablet Take 0.5 tablets (20 mg total) by mouth 2 (two) times daily. 06/06/21   Chandra Harlene LABOR, NP  TRELEGY ELLIPTA 100-62.5-25 MCG/ACT AEPB Inhale 1 puff into the lungs daily. 12/23/21   [provider]  VOLTAREN 1 % GEL APPLY TOPICALLY 4 TIMES DAILY AS NEEDED 10/16/16   Bari Theodoro FALCON, MD    Family History Family History  Problem Relation Age of Onset   Hypertension Mother    Heart disease Mother    Stroke Mother    Multiple myeloma Father 61   Thyroid  cancer Sister        dx 40's/50's   Cancer Paternal Aunt        unknown   Prostate cancer Paternal Uncle        dx >50- cancer was the cause of his death   Ovarian cancer Paternal Aunt    Throat cancer Paternal Uncle    Breast cancer Cousin        dx >50   Cancer Cousin        type unk   Cancer Cousin        type unk   Cancer Cousin        type unk    Social History Social History   Tobacco Use   Smoking status: Every Day    Current packs/day: 0.75    Average packs/day: 0.8 packs/day for 28.0 years (21.0 ttl pk-yrs)    Types: Cigarettes   Smokeless tobacco: Never   Tobacco comments:    02/13/22 Pt stated I'm trying to quit Archie JONELLE Che, CMA   Vaping Use   Vaping status: Never Used  Substance Use Topics   Alcohol use: No    Alcohol/week: 0.0 standard drinks of alcohol   Drug use: No     Allergies   Morphine and codeine, Other, and Percocet [oxycodone -acetaminophen ]   Review of Systems Review of Systems  Respiratory:  Positive for cough.   Gastrointestinal:  Positive for abdominal pain and constipation.     Physical Exam Triage Vital Signs ED Triage Vitals  Encounter Vitals Group     BP 05/08/23 1140 135/81     Systolic BP Percentile --      Diastolic BP Percentile --      Pulse Rate 05/08/23 1140 97     Resp 05/08/23 1140 14     Temp 05/08/23 1140 98.8 F (37.1 C)     Temp Source 05/08/23 1140 Oral     SpO2 05/08/23 1140 95 %     Weight --      Height --      Head Circumference --      Peak Flow --      Pain Score 05/08/23 1141 10     Pain Loc --      Pain Education --      Exclude from Growth Chart --    No data found.  Updated Vital Signs BP 135/81 (BP Location: Left Arm)   Pulse 97   Temp 98.8 F (37.1 C) (Oral)   Resp 14   LMP 01/24/2000   SpO2 95%   Visual Acuity Right Eye Distance:   Left Eye Distance:   Bilateral Distance:    Right Eye Near:   Left Eye Near:    Bilateral Near:     Physical Exam Vitals reviewed.  Constitutional:      Appearance: She is not ill-appearing, toxic-appearing or diaphoretic.     Comments: The patient is rocking back and forth while sitting on the exam table, in pain.  No acute respiratory distress  HENT:     Mouth/Throat:     Mouth: Mucous membranes are moist.  Eyes:     Extraocular Movements: Extraocular movements intact.     Pupils: Pupils are equal, round, and reactive to light.  Cardiovascular:     Rate and Rhythm: Regular rhythm.     Heart sounds: No murmur heard. Pulmonary:     Effort: Pulmonary effort is normal.     Breath sounds: Normal breath sounds.  Musculoskeletal:     Cervical back: Neck supple.  Skin:     Coloration: Skin is not jaundiced or pale.  Neurological:     General: No focal deficit present.     Mental Status: She is oriented to person, place, and time.  Psychiatric:        Behavior: Behavior normal.      UC Treatments / Results  Labs (all labs ordered are listed, but only abnormal results are displayed) Labs Reviewed - No data to display  EKG   Radiology No results found.  Procedures Procedures (including critical care time)  Medications Ordered in UC Medications - No data to display  Initial Impression / Assessment and Plan / UC Course  I have reviewed the triage vital signs and the nursing notes.  Pertinent labs & imaging results that were available during my care of the patient were reviewed by me and considered in my medical decision making (see chart for details).     With her degree of pain, I have asked her to proceed to the emergency room by private car for evaluation that we cannot accomplish for her here in the clinic setting.  Also she may need treatments that we cannot provide for her here. Final Clinical Impressions(s) / UC Diagnoses   Final diagnoses:  Left lower quadrant abdominal pain     Discharge  Instructions      Patient will go to the emergency room for further evaluation and treatment     ED Prescriptions   None    PDMP not reviewed this encounter.   Vonna Sharlet POUR, MD 05/08/23 [phone]

## 2023-05-08 NOTE — ED Notes (Signed)
 Patient is being discharged from the Urgent Care and sent to the Emergency Department via POV . Per Dr. Vonna, patient is in need of higher level of care due to severe abdominal pain and no BM in 7 days.. Patient is aware and verbalizes understanding of plan of care.  Vitals:   05/08/23 1140  BP: 135/81  Pulse: 97  Resp: 14  Temp: 98.8 F (37.1 C)  SpO2: 95%

## 2023-05-08 NOTE — ED Provider Notes (Signed)
 [name] Provider Note   CSN: [id number] Arrival date & time: 05/08/23  1217     History  Chief Complaint  Patient presents with   Constipation   Abdominal Pain    Catherine Lowery is a 78 y.o. female who presents emergency department with a chief complaint of abdominal pain. She has a past medical history of Tobacco abuse, breast cancer, thyroid  nodule, who presents emergency department for abdominal pain.  History is given by the patient and her family member at bedside.  Patient reports that she had onset of shaking chills and cough on 27 December.  She was seen at [facility] emergency department and advised to use over-the-counter cough medicine.  Patient was not getting better so she contacted her primary care doctor who started her on prednisone  and doxycycline .  Ports that since that time she has had pretty significant epigastric abdominal pain that has been progressively worsening.  8 days ago she had severe diarrhea all day.  Since that time she has not made a bowel movement.  She also states that she has had decreased urinary output.  She has had progressively worsening pain in the left lower quadrant of her abdomen.  She has had weight loss and soaking night sweats since that time.  She went to urgent care earlier but was referred to the emergency department.  Her family member states that she had 10 out of 10 pain earlier was doubled over.  She denies fever.   Constipation Associated symptoms: abdominal pain   Abdominal Pain Associated symptoms: constipation        Home Medications Prior to Admission medications   Medication Sig Start Date End Date Taking? Authorizing Provider  albuterol  (VENTOLIN  HFA) 108 (90 Base) MCG/ACT inhaler Inhale 2 puffs into the lungs every 4 (four) hours as needed for wheezing or shortness of breath. 12/23/21   [provider]  amLODipine  (NORVASC ) 10 MG tablet Take 1 tablet (10 mg total) by  mouth daily. 06/06/21   Chandra Harlene LABOR, NP  anastrozole  (ARIMIDEX ) 1 MG tablet Take 1 tablet (1 mg total) by mouth daily. 06/02/21   Iruku, Praveena, MD  aspirin  (ASPIRIN  ADULT) 325 MG tablet enteric coated; may use OTC 07/01/19   Carlie Salines A, FNP  atorvastatin  (LIPITOR) 40 MG tablet Take 1 tablet (40 mg total) by mouth daily. 06/06/21   Chandra Harlene LABOR, NP  Cholecalciferol  (VITAMIN D3) 50 MCG (2000 UT) capsule Take 1 capsule (2,000 Units total) by mouth daily. 03/04/21   Chandra Harlene LABOR, NP  dextromethorphan-guaiFENesin  (MUCINEX  DM) 30-600 MG 12hr tablet Take 1 tablet by mouth 2 (two) times daily. 04/27/23   Minnie Tinnie BRAVO, PA  levothyroxine  (SYNTHROID ) 50 MCG tablet Take 1 tablet (50 mcg total) by mouth daily. 06/01/21   Trixie File, MD  Omega-3 Fatty Acids (FISH OIL PO) Take 1,000 mg by mouth daily.    [provider]  primidone  (MYSOLINE ) 50 MG tablet Take 2 tablets (100 mg total) by mouth every morning. 06/06/21   Chandra Harlene LABOR, NP  propranolol  (INDERAL ) 40 MG tablet Take 0.5 tablets (20 mg total) by mouth 2 (two) times daily. 06/06/21   Chandra Harlene LABOR, NP  TRELEGY ELLIPTA 100-62.5-25 MCG/ACT AEPB Inhale 1 puff into the lungs daily. 12/23/21   [provider]  VOLTAREN 1 % GEL APPLY TOPICALLY 4 TIMES DAILY AS NEEDED 10/16/16   Bari Theodoro FALCON, MD      Allergies    Morphine  and codeine, Other, and Percocet [oxycodone -acetaminophen ]    Review of Systems   Review of Systems  Gastrointestinal:  Positive for abdominal pain and constipation.    Physical Exam Updated Vital Signs BP (!) 150/84   Pulse 69   Temp 98.3 F (36.8 C)   Resp 17   Wt 63 kg   LMP 01/24/2000   SpO2 99%   BMI 20.22 kg/m  Physical Exam Vitals and nursing note reviewed.  Constitutional:      General: She is not in acute distress.    Appearance: She is well-developed. She is not diaphoretic.  HENT:     Head: Normocephalic and atraumatic.     Right Ear: External ear  normal.     Left Ear: External ear normal.     Nose: Nose normal.     Mouth/Throat:     Mouth: Mucous membranes are moist.  Eyes:     General: No scleral icterus.    Conjunctiva/sclera: Conjunctivae normal.  Cardiovascular:     Rate and Rhythm: Normal rate and regular rhythm.     Heart sounds: Normal heart sounds. No murmur heard.    No friction rub. No gallop.  Pulmonary:     Effort: Pulmonary effort is normal. No respiratory distress.     Breath sounds: Normal breath sounds.  Abdominal:     General: Bowel sounds are normal. There is no distension.     Palpations: Abdomen is soft. There is no mass.     Tenderness: There is abdominal tenderness in the left lower quadrant. There is guarding. There is no rebound.  Musculoskeletal:     Cervical back: Normal range of motion.  Skin:    General: Skin is warm and dry.  Neurological:     Mental Status: She is alert and oriented to person, place, and time.  Psychiatric:        Behavior: Behavior normal.     ED Results / Procedures / Treatments   Labs (all labs ordered are listed, but only abnormal results are displayed) Labs Reviewed  COMPREHENSIVE METABOLIC PANEL - Abnormal; Notable for the following components:      Result Value   Glucose, Bld 113 (*)    GFR, Estimated 59 (*)    All other components within normal limits  CBC - Abnormal; Notable for the following components:   RBC 5.53 (*)    MCV 69.8 (*)    MCH 23.0 (*)    All other components within normal limits  LIPASE, BLOOD  URINALYSIS, ROUTINE W REFLEX MICROSCOPIC    EKG None  Radiology No results found.  Procedures Procedures    Medications Ordered in ED Medications  nicotine  (NICODERM CQ  - dosed in mg/24 hours) patch 14 mg (14 mg Transdermal Patch Applied 05/08/23 2054)  nicotine  polacrilex (NICORETTE ) gum 2 mg (has no administration in time range)  doxycycline  (VIBRA -TABS) tablet 100 mg (has no administration in time range)  anastrozole  (ARIMIDEX ) tablet  1 mg (has no administration in time range)  amLODipine  (NORVASC ) tablet 10 mg (has no administration in time range)  atorvastatin  (LIPITOR) tablet 40 mg (has no administration in time range)  losartan  (COZAAR ) tablet 25 mg (has no administration in time range)  levothyroxine  (SYNTHROID ) tablet 50 mcg (has no administration in time range)  predniSONE  (DELTASONE ) tablet 10 mg (has no administration in time range)  albuterol  (PROVENTIL ) (2.5 MG/3ML) 0.083% nebulizer solution 2.5 mg (has no administration in time range)  dextromethorphan-guaiFENesin  (MUCINEX  DM) 30-600 MG per 12 hr  tablet 1 tablet (has no administration in time range)  primidone  (MYSOLINE ) tablet 100 mg (has no administration in time range)  heparin  injection 5,000 Units (has no administration in time range)  sodium chloride  flush (NS) 0.9 % injection 3 mL (has no administration in time range)  sodium chloride  flush (NS) 0.9 % injection 3 mL (has no administration in time range)  0.9 %  sodium chloride  infusion (has no administration in time range)  ibuprofen  (ADVIL ) tablet 400 mg (has no administration in time range)  ondansetron  (ZOFRAN ) tablet 4 mg (has no administration in time range)    Or  ondansetron  (ZOFRAN ) injection 4 mg (has no administration in time range)  polyethylene glycol (MIRALAX  / GLYCOLAX ) packet 17 g (has no administration in time range)  senna-docusate (Senokot-S) tablet 1 tablet (has no administration in time range)  hydrALAZINE  (APRESOLINE ) injection 5 mg (has no administration in time range)  fentaNYL  (SUBLIMAZE ) injection 12.5-25 mcg (has no administration in time range)  ketorolac  (TORADOL ) 15 MG/ML injection 7.5 mg (7.5 mg Intramuscular Given 05/08/23 1415)  pantoprazole  (PROTONIX ) injection 40 mg (40 mg Intravenous Given 05/08/23 1856)  ondansetron  (ZOFRAN ) injection 4 mg (4 mg Intravenous Given 05/08/23 1857)  iohexol  (OMNIPAQUE ) 350 MG/ML injection 75 mL (50 mLs Intravenous Contrast Given 05/08/23 1928)   acetaminophen  (TYLENOL ) tablet 650 mg (650 mg Oral Given 05/08/23 2045)    ED Course/ Medical Decision Making/ A&P Clinical Course as of 05/08/23 1847  Tue May 08, 2023  1837 WBC: 6.8 [AH]    Clinical Course User Index [AH] Arloa Chroman, PA-C                                 Medical Decision Making Amount and/or Complexity of Data Reviewed Labs: ordered. Decision-making details documented in ED Course.  Risk OTC drugs. Prescription drug management. Decision regarding hospitalization.   This patient presents to the ED for concern of abdominal pain , this involves an extensive number of treatment options, and is a complaint that carries with it a high risk of complications and morbidity.  The differential diagnosis for generalized abdominal pain includes, but is not limited to AAA, gastroenteritis, appendicitis, Bowel obstruction, Bowel perforation. Gastroparesis, DKA, Hernia, Inflammatory bowel disease, mesenteric ischemia, pancreatitis, peritonitis SBP, volvulus.   Co morbidities:   has a past medical history of Cancer (HCC) (03/2018), Chronic kidney disease, DJD (degenerative joint disease), Family history of breast cancer, Family history of ovarian cancer, Family history of prostate cancer, Family history of prostate cancer, H/O migraine (07/01/2019), Hyperlipidemia, Hypertension, Hyperthyroidism, Pneumonia (10/2015), PONV (postoperative nausea and vomiting), Shortness of breath dyspnea, and Tremors of nervous system.   Social Determinants of Health:   SDOH Screenings   Alcohol Screen: Low Risk  (03/22/2020)  Depression (PHQ2-9): Low Risk  (03/22/2020)  Financial Resource Strain: Low Risk  (01/28/2020)  Tobacco Use: High Risk (05/08/2023)     Additional history:  {Additional history obtained from family at bedside    Lab Tests:  I Ordered, and personally interpreted labs.  The pertinent results include:   No sig findings  Imaging Studies:  I ordered imaging  studies including CT abdomen I independently visualized and interpreted imaging which showed large ovarian mass I agree with the radiologist interpretation  Cardiac Monitoring/ECG:  The patient was maintained on a cardiac monitor.  I personally viewed and interpreted the cardiac monitored which showed an underlying rhythm of: nsr  Medicines ordered and prescription  drug management:  I ordered medication including  Medications  fentaNYL  (SUBLIMAZE ) injection 50 mcg (has no administration in time range)  nicotine  (NICODERM CQ  - dosed in mg/24 hours) patch 14 mg (has no administration in time range)  nicotine  polacrilex (NICORETTE ) gum 2 mg (has no administration in time range)  ketorolac  (TORADOL ) 15 MG/ML injection 7.5 mg (7.5 mg Intramuscular Given 05/08/23 1415)  pantoprazole  (PROTONIX ) injection 40 mg (40 mg Intravenous Given 05/08/23 1856)  ondansetron  (ZOFRAN ) injection 4 mg (4 mg Intravenous Given 05/08/23 1857)  iohexol  (OMNIPAQUE ) 350 MG/ML injection 75 mL (50 mLs Intravenous Contrast Given 05/08/23 1928)  acetaminophen  (TYLENOL ) tablet 650 mg (650 mg Oral Given 05/08/23 2045)   for abdominal pain  Reevaluation of the patient after these medicines showed that the patient improved I have reviewed the patients home medicines and have made adjustments as needed  Test Considered:  Patient recently canceled a CTA chest to monitor her aortic aneurysm - may benefit patient at this time to r/o worsening and metastasis to the chest  Critical Interventions:    Consultations Obtained:   Problem List / ED Course:     ICD-10-CM   1. Pelvic mass  R19.00     2. Constipation, unspecified constipation type  K59.00       MDM: patient here with severe abdominal pain, now resolved . Inability to make a bm for the past 8 days. Patient also weak. Poor oral intake. Feel she would benfit from admission and expedited work up.     Dispostion:  After consideration of the diagnostic results and  the patients response to treatment, I feel that the patent would benefit from admission.         Final Clinical Impression(s) / ED Diagnoses Final diagnoses:  None    Rx / DC Orders ED Discharge Orders     None         Arloa Chroman, PA-C 05/08/23 2343    Jerrol Agent, MD 05/09/23 1341

## 2023-05-08 NOTE — ED Triage Notes (Addendum)
 PT sent over from Urgent care. Pt complains of abd pain x 8 days and has not had a BM. Denies nausea/vomiting. Pt states pain is primarily on LLQ

## 2023-05-08 NOTE — ED Notes (Signed)
 ED TO INPATIENT HANDOFF REPORT  ED Nurse Name and Phone #: Braulio RAMAN Name/Age/Gender Catherine Lowery Hummer 78 y.o. female Room/Bed: 019C/019C  Code Status   Code Status: Limited: Do not attempt resuscitation (DNR) -DNR-LIMITED -Do Not Intubate/DNI   Home/SNF/Other Home Patient oriented to: self, place, time, and situation Is this baseline? Yes   Triage Complete: Triage complete  Chief Complaint Abdominal pain [R10.9]  Triage Note PT sent over from Urgent care. Pt complains of abd pain x 8 days and has not had a BM. Denies nausea/vomiting. Pt states pain is primarily on LLQ   Allergies Allergies  Allergen Reactions   Morphine And Codeine Itching   Other Itching and Nausea And Vomiting    PAIN MEDICINES   Percocet [Oxycodone -Acetaminophen ] Nausea And Vomiting    Level of Care/Admitting Diagnosis ED Disposition     ED Disposition  Admit   Condition  --   Comment  Hospital Area: [facility] [[id number]]  Level of Care: Telemetry Medical [104]  May admit patient to [facility] or [facility] if equivalent level of care is available:: No  Covid Evaluation: Asymptomatic - no recent exposure (last 10 days) testing not required  Diagnosis: Abdominal pain [255246]  Admitting Physician: SUNDIL, SUBRINA [[id number]]  Attending Physician: SUNDIL, SUBRINA [[id number]]  Certification:: I certify this patient will need inpatient services for at least 2 midnights  Expected Medical Readiness: 05/14/2023          B Medical/Surgery History Past Medical History:  Diagnosis Date   Cancer (HCC) 03/2018   right breast cancer   Chronic kidney disease    CKD   DJD (degenerative joint disease)    LEFT HIP   Family history of breast cancer    Family history of ovarian cancer    Family history of prostate cancer    Family history of prostate cancer    H/O migraine 07/01/2019   Hyperlipidemia    takes Simvasatin daily   Hypertension    takes Propranolol  and HCTZ daioly    Hyperthyroidism    Pneumonia 10/2015   PONV (postoperative nausea and vomiting)    when ether used   Reactive airway disease 05/08/2023   Shortness of breath dyspnea    with ambulation   Tremors of nervous system    takes Primidone  daily   Past Surgical History:  Procedure Laterality Date   BOne Spur     left heel   BREAST LUMPECTOMY WITH RADIOACTIVE SEED AND SENTINEL LYMPH NODE BIOPSY Right 05/07/2018   Procedure: RIGHT BREAST LUMPECTOMY WITH RADIOACTIVE SEED AND RIGHT  SENTINEL LYMPH NODE MAPPING;  Surgeon: Vanderbilt Ned, MD;  Location: [facility];  Service: General;  Laterality: Right;   COLONOSCOPY     JOINT REPLACEMENT Bilateral    knee   KNEE SURGERY     RE-EXCISION OF BREAST LUMPECTOMY Right 06/05/2018   Procedure: RE-EXCISION OF RIGHT BREAST LUMPECTOMY;  Surgeon: Vanderbilt Ned, MD;  Location: [facility];  Service: General;  Laterality: Right;   ROTATOR CUFF REPAIR Bilateral    TOTAL HIP ARTHROPLASTY Left 12/29/2015   TOTAL HIP ARTHROPLASTY Left 12/29/2015   Procedure: TOTAL HIP ARTHROPLASTY ANTERIOR APPROACH;  Surgeon: Toribio JULIANNA Chancy, MD;  Location: [facility] OR;  Service: Orthopedics;  Laterality: Left;     A IV Location/Drains/Wounds Patient Lines/Drains/Airways Status     Active Line/Drains/Airways     Name Placement date Placement time Site Days   Peripheral IV 05/08/23 20 G Right Antecubital  05/08/23  --  Antecubital  less than 1            Intake/Output Last 24 hours No intake or output data in the 24 hours ending 05/08/23 2321  Labs/Imaging Results for orders placed or performed during the hospital encounter of 05/08/23 (from the past 48 hours)  Lipase, blood     Status: None   Collection Time: 05/08/23  1:17 PM  Result Value Ref Range   Lipase 32 11 - 51 U/L    Comment: Performed at [facility], [address], [place], [name] [address]  Comprehensive metabolic panel     Status: Abnormal   Collection Time: 05/08/23   1:17 PM  Result Value Ref Range   Sodium 138 135 - 145 mmol/L   Potassium 4.4 3.5 - 5.1 mmol/L   Chloride 104 98 - 111 mmol/L   CO2 23 22 - 32 mmol/L   Glucose, Bld 113 (H) 70 - 99 mg/dL    Comment: Glucose reference range applies only to samples taken after fasting for at least 8 hours.   BUN 19 8 - 23 mg/dL   Creatinine, Ser 9.01 0.44 - 1.00 mg/dL   Calcium  9.7 8.9 - 10.3 mg/dL   Total Protein 7.9 6.5 - 8.1 g/dL   Albumin  3.6 3.5 - 5.0 g/dL   AST 28 15 - 41 U/L   ALT 12 0 - 44 U/L   Alkaline Phosphatase 60 38 - 126 U/L   Total Bilirubin 0.8 0.0 - 1.2 mg/dL   GFR, Estimated 59 (L) >60 mL/min    Comment: (NOTE) Calculated using the CKD-EPI Creatinine Equation (2021)    Anion gap 11 5 - 15    Comment: Performed at [facility], [address], [place], [name] [address]  CBC     Status: Abnormal   Collection Time: 05/08/23  1:17 PM  Result Value Ref Range   WBC 6.8 4.0 - 10.5 K/uL   RBC 5.53 (H) 3.87 - 5.11 MIL/uL   Hemoglobin 12.7 12.0 - 15.0 g/dL   HCT 61.3 63.9 - 53.9 %   MCV 69.8 (L) 80.0 - 100.0 fL   MCH 23.0 (L) 26.0 - 34.0 pg   MCHC 32.9 30.0 - 36.0 g/dL   RDW 85.5 88.4 - 84.4 %   Platelets 363 150 - 400 K/uL    Comment: REPEATED TO VERIFY   nRBC 0.0 0.0 - 0.2 %    Comment: Performed at [facility], [address], [place], [name] [address]   CT ABDOMEN PELVIS W CONTRAST Result Date: 05/08/2023 CLINICAL DATA:  Left lower quadrant pain EXAM: CT ABDOMEN AND PELVIS WITH CONTRAST TECHNIQUE: Multidetector CT imaging of the abdomen and pelvis was performed using the standard protocol following bolus administration of intravenous contrast. RADIATION DOSE REDUCTION: This exam was performed according to the departmental dose-optimization program which includes automated exposure control, adjustment of the mA and/or kV according to patient size and/or use of iterative reconstruction technique. CONTRAST:  50mL OMNIPAQUE  IOHEXOL  350 MG/ML SOLN COMPARISON:  CT 10/30/2015  FINDINGS: Lower chest: Lung bases demonstrate bandlike scarring within the lower lobes and right middle lobe. No acute airspace disease. Hepatobiliary: Subcentimeter hypodensities in the liver too small to further characterize. No calcified gallstone or biliary dilatation. Pancreas: Unremarkable. No pancreatic ductal dilatation or surrounding inflammatory changes. Spleen: Normal in size without focal abnormality. Adrenals/Urinary Tract: Adrenal glands show no focal abnormality. Kidneys show no hydronephrosis. Simple bilateral renal cysts for  which no imaging follow-up is recommended. 2.1 cm superior pole exophytic complex cyst. Bladder partially obscured Stomach/Bowel: Stomach nonenlarged. No dilated small bowel. No acute bowel wall thickening. Diverticular disease of the left colon. Vascular/Lymphatic: Advanced aortic atherosclerosis. No aneurysm. Mildly enlarged left Peri aortic node measuring 19 mm on series 3, image 42. Reproductive: Uterus is unremarkable. Heterogenous left adnexal mass lesion measuring 6.8 x 5.8 by 7 cm concerning for neoplasm. Other: Small volume free fluid within the pelvis. Evidence for peritoneal metastatic disease. 10 mm left upper quadrant peritoneal nodule on series 3, image 35. Right mid quadrant soft tissue mass measuring 5.7 x 2.4 cm on series 3, image 44. Left lower quadrant soft tissue mass measuring 8.1 by 2.5 by 12.1 cm. Soft tissue nodule in the right colic gutter measuring 1.7 cm on series 3, image 55. Right posterior pelvic soft tissue mass measuring 3.6 x 2.6 cm on series 3, image 76. Additional small mesenteric nodules are noted. Musculoskeletal: No acute or suspicious osseous abnormality. Left hip replacement with artifact. IMPRESSION: 1. Large heterogeneous cystic and solid left adnexal mass highly suspicious for ovarian neoplasm. 2. Positive for peritoneal metastatic disease as evidence by multiple intra-abdominal and pelvic tumor implants. Trace volume ascites in the  pelvis. Several mildly enlarged retroperitoneal lymph nodes also suspicious for metastatic disease. 3. Diverticular disease of the left colon without acute inflammatory process. 4. Aortic atherosclerosis. Aortic Atherosclerosis (ICD10-I70.0). Electronically Signed   By: Luke Bun M.D.   On: 05/08/2023 19:53    Pending Labs Unresulted Labs (From admission, onward)     Start     Ordered   05/09/23 0500  Comprehensive metabolic panel  Tomorrow morning,   R        05/08/23 2303   05/09/23 0500  CBC  Tomorrow morning,   R        05/08/23 2303   05/08/23 2304  CA 125  Add-on,   AD        05/08/23 2303   05/08/23 2304  Cancer antigen 19-9  Add-on,   AD        05/08/23 2303   05/08/23 2304  Cancer antigen 27.29  Add-on,   AD        05/08/23 2303   05/08/23 2304  CEA  Add-on,   AD        05/08/23 2303   05/08/23 1311  Urinalysis, Routine w reflex microscopic -Urine, Clean Catch  Once,   URGENT       Question:  Specimen Source  Answer:  Urine, Clean Catch   05/08/23 1310            Vitals/Pain Today's Vitals   05/08/23 2100 05/08/23 2130 05/08/23 2200 05/08/23 2230  BP: (!) 153/93 (!) 155/98 (!) 161/83 (!) 147/95  Pulse: 89 80 60 (!) 59  Resp: (!) 23 17 20 14   Temp:      TempSrc:      SpO2: 95% 97% 99% 98%  Weight:      PainSc:        Isolation Precautions No active isolations  Medications Medications  nicotine  (NICODERM CQ  - dosed in mg/24 hours) patch 14 mg (14 mg Transdermal Patch Applied 05/08/23 2054)  nicotine  polacrilex (NICORETTE ) gum 2 mg (has no administration in time range)  doxycycline  (VIBRA -TABS) tablet 100 mg (has no administration in time range)  anastrozole  (ARIMIDEX ) tablet 1 mg (has no administration in time range)  amLODipine  (NORVASC ) tablet 10 mg (has no administration in time range)  atorvastatin  (LIPITOR) tablet 40 mg (has no administration in time range)  losartan  (COZAAR ) tablet 25 mg (has no administration in time range)  levothyroxine   (SYNTHROID ) tablet 50 mcg (has no administration in time range)  predniSONE  (DELTASONE ) tablet 10 mg (has no administration in time range)  albuterol  (PROVENTIL ) (2.5 MG/3ML) 0.083% nebulizer solution 2.5 mg (has no administration in time range)  dextromethorphan-guaiFENesin  (MUCINEX  DM) 30-600 MG per 12 hr tablet 1 tablet (has no administration in time range)  primidone  (MYSOLINE ) tablet 100 mg (has no administration in time range)  heparin  injection 5,000 Units (has no administration in time range)  sodium chloride  flush (NS) 0.9 % injection 3 mL (has no administration in time range)  sodium chloride  flush (NS) 0.9 % injection 3 mL (has no administration in time range)  0.9 %  sodium chloride  infusion (has no administration in time range)  ibuprofen  (ADVIL ) tablet 400 mg (has no administration in time range)  ondansetron  (ZOFRAN ) tablet 4 mg (has no administration in time range)    Or  ondansetron  (ZOFRAN ) injection 4 mg (has no administration in time range)  polyethylene glycol (MIRALAX  / GLYCOLAX ) packet 17 g (has no administration in time range)  senna-docusate (Senokot-S) tablet 1 tablet (has no administration in time range)  hydrALAZINE  (APRESOLINE ) injection 5 mg (has no administration in time range)  fentaNYL  (SUBLIMAZE ) injection 12.5-25 mcg (has no administration in time range)  ketorolac  (TORADOL ) 15 MG/ML injection 7.5 mg (7.5 mg Intramuscular Given 05/08/23 1415)  pantoprazole  (PROTONIX ) injection 40 mg (40 mg Intravenous Given 05/08/23 1856)  ondansetron  (ZOFRAN ) injection 4 mg (4 mg Intravenous Given 05/08/23 1857)  iohexol  (OMNIPAQUE ) 350 MG/ML injection 75 mL (50 mLs Intravenous Contrast Given 05/08/23 1928)  acetaminophen  (TYLENOL ) tablet 650 mg (650 mg Oral Given 05/08/23 2045)    Mobility walks     Focused Assessments     R Recommendations: See Admitting Provider Note  Report given to:   Additional Notes:

## 2023-05-09 DIAGNOSIS — R1030 Lower abdominal pain, unspecified: Secondary | ICD-10-CM

## 2023-05-09 DIAGNOSIS — D4959 Neoplasm of unspecified behavior of other genitourinary organ: Secondary | ICD-10-CM | POA: Diagnosis not present

## 2023-05-09 DIAGNOSIS — R1900 Intra-abdominal and pelvic swelling, mass and lump, unspecified site: Secondary | ICD-10-CM

## 2023-05-09 DIAGNOSIS — J4489 Other specified chronic obstructive pulmonary disease: Secondary | ICD-10-CM

## 2023-05-09 LAB — CBC
HCT: 36 % (ref 36.0–46.0)
Hemoglobin: 12.1 g/dL (ref 12.0–15.0)
MCH: 23.1 pg — ABNORMAL LOW (ref 26.0–34.0)
MCHC: 33.6 g/dL (ref 30.0–36.0)
MCV: 68.7 fL — ABNORMAL LOW (ref 80.0–100.0)
Platelets: 340 10*3/uL (ref 150–400)
RBC: 5.24 MIL/uL — ABNORMAL HIGH (ref 3.87–5.11)
RDW: 14.2 % (ref 11.5–15.5)
WBC: 6.3 10*3/uL (ref 4.0–10.5)
nRBC: 0 % (ref 0.0–0.2)

## 2023-05-09 LAB — IRON AND TIBC
Iron: 59 ug/dL (ref 28–170)
Saturation Ratios: 26 % (ref 10.4–31.8)
TIBC: 228 ug/dL — ABNORMAL LOW (ref 250–450)
UIBC: 169 ug/dL

## 2023-05-09 LAB — COMPREHENSIVE METABOLIC PANEL
ALT: 11 U/L (ref 0–44)
AST: 25 U/L (ref 15–41)
Albumin: 3.5 g/dL (ref 3.5–5.0)
Alkaline Phosphatase: 57 U/L (ref 38–126)
Anion gap: 13 (ref 5–15)
BUN: 26 mg/dL — ABNORMAL HIGH (ref 8–23)
CO2: 23 mmol/L (ref 22–32)
Calcium: 9.5 mg/dL (ref 8.9–10.3)
Chloride: 101 mmol/L (ref 98–111)
Creatinine, Ser: 0.97 mg/dL (ref 0.44–1.00)
GFR, Estimated: 59 mL/min — ABNORMAL LOW (ref >60)
Glucose, Bld: 100 mg/dL — ABNORMAL HIGH (ref 70–99)
Potassium: 4.5 mmol/L (ref 3.5–5.1)
Sodium: 137 mmol/L (ref 135–145)
Total Bilirubin: 1 mg/dL (ref 0.0–1.2)
Total Protein: 7.5 g/dL (ref 6.5–8.1)

## 2023-05-09 LAB — URINALYSIS, ROUTINE W REFLEX MICROSCOPIC
Bacteria, UA: NONE SEEN
Bilirubin Urine: NEGATIVE
Glucose, UA: NEGATIVE mg/dL
Ketones, ur: NEGATIVE mg/dL
Leukocytes,Ua: NEGATIVE
Nitrite: NEGATIVE
Protein, ur: 100 mg/dL — AB
Specific Gravity, Urine: >1.046 — ABNORMAL HIGH (ref 1.005–1.030)
pH: 5 (ref 5.0–8.0)

## 2023-05-09 LAB — RETICULOCYTES
Immature Retic Fract: 18 % — ABNORMAL HIGH (ref 2.3–15.9)
RBC.: 5.51 MIL/uL — ABNORMAL HIGH (ref 3.87–5.11)
Retic Count, Absolute: 77.1 10*3/uL (ref 19.0–186.0)
Retic Ct Pct: 1.4 % (ref 0.4–3.1)

## 2023-05-09 LAB — VITAMIN B12: Vitamin B-12: 285 pg/mL (ref 180–914)

## 2023-05-09 LAB — FERRITIN: Ferritin: 263 ng/mL (ref 11–307)

## 2023-05-09 LAB — FOLATE: Folate: 20 ng/mL (ref >5.9)

## 2023-05-09 MED ORDER — PROPRANOLOL HCL 20 MG PO TABS
20.0000 mg | ORAL_TABLET | Freq: Every day | ORAL | Status: DC
  Administered 2023-05-09 – 2023-05-10 (×2): 20 mg via ORAL
  Filled 2023-05-09 (×2): qty 1

## 2023-05-09 MED ORDER — FLUTICASONE FUROATE-VILANTEROL 100-25 MCG/ACT IN AEPB
1.0000 | INHALATION_SPRAY | Freq: Every day | RESPIRATORY_TRACT | Status: DC
  Administered 2023-05-09 – 2023-05-10 (×2): 1 via RESPIRATORY_TRACT
  Filled 2023-05-09: qty 28

## 2023-05-09 MED ORDER — HYDROCODONE-ACETAMINOPHEN 5-325 MG PO TABS
1.0000 | ORAL_TABLET | Freq: Four times a day (QID) | ORAL | Status: DC | PRN
  Administered 2023-05-09 – 2023-05-10 (×2): 1 via ORAL
  Filled 2023-05-09 (×2): qty 1

## 2023-05-09 MED ORDER — NICOTINE 14 MG/24HR TD PT24: 14.0000 mg | MEDICATED_PATCH | Freq: Every day | TRANSDERMAL | Status: DC

## 2023-05-09 MED ORDER — NICOTINE 14 MG/24HR TD PT24
14.0000 mg | MEDICATED_PATCH | Freq: Every day | TRANSDERMAL | Status: DC
  Administered 2023-05-09 – 2023-05-10 (×2): 14 mg via TRANSDERMAL
  Filled 2023-05-09 (×2): qty 1

## 2023-05-09 MED ORDER — ENSURE ENLIVE PO LIQD
237.0000 mL | Freq: Two times a day (BID) | ORAL | Status: DC
  Administered 2023-05-09 – 2023-05-10 (×3): 237 mL via ORAL

## 2023-05-09 MED ORDER — BISACODYL 5 MG PO TBEC
10.0000 mg | DELAYED_RELEASE_TABLET | Freq: Two times a day (BID) | ORAL | Status: DC
  Administered 2023-05-09 – 2023-05-10 (×2): 10 mg via ORAL
  Filled 2023-05-09 (×2): qty 2

## 2023-05-09 MED ORDER — POLYETHYLENE GLYCOL 3350 17 G PO PACK
17.0000 g | PACK | Freq: Once | ORAL | Status: AC
  Administered 2023-05-09: 17 g via ORAL
  Filled 2023-05-09: qty 1

## 2023-05-09 MED ORDER — SODIUM CHLORIDE 0.9% FLUSH: 3.0000 mL | INTRAVENOUS | Status: DC | PRN

## 2023-05-09 MED ORDER — IPRATROPIUM-ALBUTEROL 0.5-2.5 (3) MG/3ML IN SOLN: 3.0000 mL | RESPIRATORY_TRACT | Status: DC | PRN

## 2023-05-09 MED ORDER — AMLODIPINE BESYLATE 10 MG PO TABS
10.0000 mg | ORAL_TABLET | Freq: Every day | ORAL | Status: DC
  Administered 2023-05-09 – 2023-05-10 (×2): 10 mg via ORAL
  Filled 2023-05-09 (×2): qty 1

## 2023-05-09 MED ORDER — POLYETHYLENE GLYCOL 3350 17 G PO PACK
34.0000 g | PACK | Freq: Every day | ORAL | Status: DC
  Administered 2023-05-10: 34 g via ORAL
  Filled 2023-05-09: qty 2

## 2023-05-09 MED ORDER — UMECLIDINIUM BROMIDE 62.5 MCG/ACT IN AEPB
1.0000 | INHALATION_SPRAY | Freq: Every day | RESPIRATORY_TRACT | Status: DC
  Administered 2023-05-09 – 2023-05-10 (×2): 1 via RESPIRATORY_TRACT
  Filled 2023-05-09: qty 7

## 2023-05-09 MED ORDER — SODIUM CHLORIDE 0.9% FLUSH
3.0000 mL | Freq: Two times a day (BID) | INTRAVENOUS | Status: DC
  Administered 2023-05-09: 3 mL via INTRAVENOUS

## 2023-05-09 NOTE — Assessment & Plan Note (Signed)
 05-09-2023 gyn/onc to address today in consultation.

## 2023-05-09 NOTE — Assessment & Plan Note (Signed)
 05-09-2023 stable.

## 2023-05-09 NOTE — Assessment & Plan Note (Addendum)
 05-08-2022 gyn/onc consulted. They will see her this afternoon after they finish up at [facility]. Pt aware of consultation this afternoon. pt's regular oncologist(Iruku) made aware of pt's admission. Continue with IV fentanyl  prn. Will add po norco 5-10 mg prn. Further diagnostic workup and therapeutic intervention per gyn/onc consult.
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
## 2023-05-09 NOTE — Assessment & Plan Note (Signed)
 05-09-2023 followed by medical oncology

## 2023-05-09 NOTE — Assessment & Plan Note (Signed)
 05-09-2023 continue with inderal 20 mg every day, norvasc 10 mg qday.

## 2023-05-09 NOTE — Plan of Care (Signed)
  Problem: Education: Goal: Knowledge of General Education information will improve Description: Including pain rating scale, medication(s)/side effects and non-pharmacologic comfort measures Outcome: Progressing   Problem: Activity: Goal: Risk for activity intolerance will decrease Outcome: Progressing   Problem: Nutrition: Goal: Adequate nutrition will be maintained Outcome: Progressing   Problem: Coping: Goal: Level of anxiety will decrease Outcome: Progressing   Problem: Pain Management: Goal: General experience of comfort will improve Outcome: Progressing   Problem: Safety: Goal: Ability to remain free from injury will improve Outcome: Progressing   Problem: Skin Integrity: Goal: Risk for impaired skin integrity will decrease Outcome: Progressing   Problem: Elimination: Goal: Will not experience complications related to bowel motility Outcome: Not Progressing

## 2023-05-09 NOTE — Hospital Course (Addendum)
 HPI: Catherine Lowery is a 78 y.o. female with medical history significant of history of CKD stage III, history of breast cancer on Arimidex , essential hypertension, reactive airway disease, and chronic vitamin D  deficiency presented emergency department with multiple complaint including cough, nasal congestion since 12/27, diarrhea until 12/30 now having constipation and abdominal cramping.  Patient reported to prednisone  and doxycycline  which helped with improvement of nasal congestion and cough.   Reported that she has noticed left-sided lower abdominal pain for last 2 weeks.  Also reported associated constipation for last 6 to 7 days.  Patient denies any nausea, vomiting.  Abdominal cramping.   She also has poor appetite, generalized weakness and lost weight. Patient denies any fever, chill, chest pain and shortness of breath.   ED Course:  At presentation to ED patient found hypertensive blood pressure 161/83 otherwise hemodynamically stable. CBC showing low MCV 69 which is chronic in nature.  Otherwise unremarkable. CMP showing evidence of CKD stage IIIa otherwise normal finding. Pending UA.   CT abdomen pelvis showed: IMPRESSION: 1. Large heterogeneous cystic and solid left adnexal mass highly suspicious for ovarian neoplasm. 2. Positive for peritoneal metastatic disease as evidence by multiple intra-abdominal and pelvic tumor implants. Trace volume ascites in the pelvis. Several mildly enlarged retroperitoneal lymph nodes also suspicious for metastatic disease. 3. Diverticular disease of the left colon without acute inflammatory process. 4. Aortic atherosclerosis.   Due to significant abdominal pain in the ED patient has been DC'd fentanyl  and Toradol  dose. ED for reported that patient has history of aortic aneurysm which is 3 cm size.  Given patient has a new finding of ovarian neoplasm planning to obtaining CT chest without contrast to follow-up with any metastasis and aortic  aneurysm as well.   Hospital has been contacted for further evaluation management of inpatient ovarian cancer workup and pain management.   I have consulted and informed Dr. Hoy Masters over phone.  Stated that will order serum tumor marker and oncology team will evaluate patient in the a.m.  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 2. Positive for peritoneal metastatic disease as evidence by multiple intra-abdominal and pelvic tumor implants. Trace volume ascites in the pelvis. Several mildly enlarged retroperitoneal lymph nodes also suspicious for metastatic disease. 3. Diverticular disease of the left colon without acute inflammatory process. 4. Aortic atherosclerosis. CTA  No aortic aneurysm or dissection. 2. Emphysema and chronic bronchitis  Antibiotic Therapy: Anti-infectives (From admission, onward)    Start     Dose/Rate Route Frequency Ordered Stop   05/08/23 2315  doxycycline  (VIBRA -TABS) tablet 100 mg        100 mg Oral 2 times daily 05/08/23 2303 05/10/23 2159       Procedures:   Consultants: Gyn-Onc

## 2023-05-09 NOTE — Consult Note (Signed)
 GYN ONCOLOGY CONSULT NOTE ADDENDUM   History of Present Illness The patient, with a history of breast cancer, presented with left-sided abdominal pain that began after starting a course of prednisone  and antibiotics for an upper respiratory infection. The pain was not present prior to the infection. The patient also reported a history of constipation and abdominal pain a month prior, which resolved after bowel movement and a day of diarrhea.  Over the past six months, the patient experienced significant unintentional weight loss, initially attributed to grief following the death of her son. Despite attempts to regain weight through dietary changes, the weight loss persisted. The patient denied any bloating, nausea, or vomiting.  The patient has a history of breast cancer, for which she underwent surgery and is currently on Arimidex . She also has a history of thyroid  disease, for which she received radiation therapy. The patient reported a family history of various cancers, including thyroid  cancer in a sister, bone cancer in her father, and uterine cancer in an aunt.  The patient is a former smoker, having quit very recently, and was smoking a pack and a half per day prior to cessation. She also reported having COPD. The patient is currently working two jobs and reported no issues with energy levels.    Physical Exam . GENERAL:Thin NECK: No supraclavicular lymphadenopathy palpated. ABDOMEN: Soft, tympanic upon percussion, with a firm area on the left side, subcosal, possibly the lower edge of the liver or spleen. No palpable masses or tenderness. 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 History of breast cancer w/negative cancer genetic testing and family history of various cancers.  Medical Oncology note  appreciated -Order biopsy of the abdominal mass under CT guidance to confirm diagnosis and determine origin of the tumor.  Imaging was reviewed by IR and there is a lesion that is amenable to sampling -Review results tumor markers  [place], MD

## 2023-05-09 NOTE — Assessment & Plan Note (Signed)
 05-09-2023 on RA. Not exacerbated.

## 2023-05-09 NOTE — Assessment & Plan Note (Signed)
 05-09-2023 due to new ovarian mass.

## 2023-05-09 NOTE — Assessment & Plan Note (Signed)
 05-09-2023 stable. Scr 0.97

## 2023-05-09 NOTE — Subjective & Objective (Signed)
 Pt seen and examined. Abd is hurting again. Requesting more IV fentanyl. Heard back from gyn/onc. They are operating at [facility] today and will see patient in consult this afternoon.
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
## 2023-05-09 NOTE — Consult Note (Signed)
 Chief Complaint: Metastatic cancer  Referring Provider(s): Olam Mill  Supervising Physician: Alona Corners  Patient Status: [facility] - In-pt  History of Present Illness: Catherine Lowery is a 78 y.o. female with a history of breast cancer, CKD, and hyperthyroidism.  She presented with left-sided abdominal pain.  She reports a significant unintentional weight loss over the last 6 months. She attributed this to grief following the death of her son.   CT scan showed = 1. Large heterogeneous cystic and solid left adnexal mass highly suspicious for ovarian neoplasm. 2. Positive for peritoneal metastatic disease as evidence by multiple intra-abdominal and pelvic tumor implants. Trace volume ascites in the pelvis. Several mildly enlarged retroperitoneal lymph nodes also suspicious for metastatic disease.  We are asked to perform a biopsy for tissue diagnosis and to place a tunneled cathter with port.  Patient is DNR  Past Medical History:  Diagnosis Date   Cancer (HCC) 03/2018   right breast cancer   Chronic kidney disease    CKD   DJD (degenerative joint disease)    LEFT HIP   Family history of breast cancer    Family history of ovarian cancer    Family history of prostate cancer    Family history of prostate cancer    H/O migraine 07/01/2019   Hyperlipidemia    takes Simvasatin daily   Hypertension    takes Propranolol  and HCTZ daioly   Hyperthyroidism    Pneumonia 10/2015   PONV (postoperative nausea and vomiting)    when ether used   Reactive airway disease 05/08/2023   Shortness of breath dyspnea    with ambulation   Tremors of nervous system    takes Primidone  daily    Past Surgical History:  Procedure Laterality Date   BOne Spur     left heel   BREAST LUMPECTOMY WITH RADIOACTIVE SEED AND SENTINEL LYMPH NODE BIOPSY Right 05/07/2018   Procedure: RIGHT BREAST LUMPECTOMY WITH RADIOACTIVE SEED AND RIGHT  SENTINEL LYMPH NODE MAPPING;  Surgeon: Vanderbilt Ned, MD;  Location: [facility];  Service: General;  Laterality: Right;   COLONOSCOPY     JOINT REPLACEMENT Bilateral    knee   KNEE SURGERY     RE-EXCISION OF BREAST LUMPECTOMY Right 06/05/2018   Procedure: RE-EXCISION OF RIGHT BREAST LUMPECTOMY;  Surgeon: Vanderbilt Ned, MD;  Location: [facility];  Service: General;  Laterality: Right;   ROTATOR CUFF REPAIR Bilateral    TOTAL HIP ARTHROPLASTY Left 12/29/2015   TOTAL HIP ARTHROPLASTY Left 12/29/2015   Procedure: TOTAL HIP ARTHROPLASTY ANTERIOR APPROACH;  Surgeon: Toribio JULIANNA Chancy, MD;  Location: [facility] OR;  Service: Orthopedics;  Laterality: Left;    Allergies: Morphine and codeine, Other, and Percocet [oxycodone -acetaminophen ]  Medications: Prior to Admission medications   Medication Sig Start Date End Date Taking? Authorizing Provider  albuterol  (VENTOLIN  HFA) 108 (90 Base) MCG/ACT inhaler Inhale 2 puffs into the lungs every 4 (four) hours as needed for wheezing or shortness of breath. 12/23/21  Yes [provider]  amLODipine  (NORVASC ) 10 MG tablet Take 1 tablet (10 mg total) by mouth daily. 06/06/21  Yes Chandra Harlene LABOR, NP  anastrozole  (ARIMIDEX ) 1 MG tablet Take 1 tablet (1 mg total) by mouth daily. 06/02/21  Yes Iruku, Praveena, MD  aspirin  (ASPIRIN  ADULT) 325 MG tablet enteric coated; may use OTC Patient taking differently: Take 325 mg by mouth daily. enteric coated; may use OTC 07/01/19  Yes Carlie Camelia LABOR, FNP  atorvastatin  (  LIPITOR) 40 MG tablet Take 1 tablet (40 mg total) by mouth daily. 06/06/21  Yes Chandra Harlene LABOR, NP  Cholecalciferol  (VITAMIN D3) 50 MCG (2000 UT) capsule Take 1 capsule (2,000 Units total) by mouth daily. 03/04/21  Yes Chandra Harlene LABOR, NP  doxycycline  (MONODOX ) 100 MG capsule Take 100 mg by mouth 2 (two) times daily. 05/03/23  Yes [provider]  levothyroxine  (SYNTHROID ) 50 MCG tablet Take 1 tablet (50 mcg total) by mouth daily. 06/01/21  Yes Trixie File,  MD  losartan  (COZAAR ) 25 MG tablet Take 25 mg by mouth daily. 03/08/23  Yes [provider]  Omega-3 Fatty Acids (FISH OIL PO) Take 1,000 mg by mouth daily.   Yes [provider]  predniSONE  (DELTASONE ) 10 MG tablet Take by mouth. Take 6 tablets on day 1 and take are directed for 6 days 05/03/23  Yes [provider]  primidone  (MYSOLINE ) 50 MG tablet Take 2 tablets (100 mg total) by mouth every morning. 06/06/21  Yes Chandra Harlene LABOR, NP  propranolol  (INDERAL ) 40 MG tablet Take 0.5 tablets (20 mg total) by mouth 2 (two) times daily. Patient taking differently: Take 20 mg by mouth daily. 06/06/21  Yes Chandra Harlene A, NP  TRELEGY ELLIPTA 100-62.5-25 MCG/ACT AEPB Inhale 1 puff into the lungs daily. 12/23/21  Yes [provider]  VOLTAREN 1 % GEL APPLY TOPICALLY 4 TIMES DAILY AS NEEDED 10/16/16  Yes Roscoe, Theodoro FALCON, MD  dextromethorphan-guaiFENesin  (MUCINEX  DM) 30-600 MG 12hr tablet Take 1 tablet by mouth 2 (two) times daily. Patient not taking: Reported on 05/08/2023 04/27/23   Minnie Tinnie BRAVO, PA     Family History  Problem Relation Age of Onset   Hypertension Mother    Heart disease Mother    Stroke Mother    Multiple myeloma Father 86   Thyroid  cancer Sister        dx 55's/50's   Cancer Paternal Aunt        unknown   Prostate cancer Paternal Uncle        dx >50- cancer was the cause of his death   Ovarian cancer Paternal Aunt    Throat cancer Paternal Uncle    Breast cancer Cousin        dx >50   Cancer Cousin        type unk   Cancer Cousin        type unk   Cancer Cousin        type unk    Social History   Socioeconomic History   Marital status: Single    Spouse name: Not on file   Number of children: 1   Years of education: 14   Highest education level: Not on file  Occupational History    Comment: retired  Tobacco Use   Smoking status: Every Day    Current packs/day: 0.75    Average packs/day: 0.8 packs/day for 28.0 years (21.0  ttl pk-yrs)    Types: Cigarettes   Smokeless tobacco: Never   Tobacco comments:    02/13/22 Pt stated I'm trying to quit Archie JONELLE Che, CMA   Vaping Use   Vaping status: Never Used  Substance and Sexual Activity   Alcohol use: No    Alcohol/week: 0.0 standard drinks of alcohol   Drug use: No   Sexual activity: Not Currently    Birth control/protection: Post-menopausal  Other Topics Concern   Not on file  Social History Narrative   Lives with handicapped son  Caffeine- coffee, 2 cups daily, 1-2 sodas daily   Social Drivers of Health   Financial Resource Strain: Low Risk  (01/28/2020)   Overall Financial Resource Strain (CARDIA)    Difficulty of Paying Living Expenses: Not very hard  Food Insecurity: No Food Insecurity (05/09/2023)   Hunger Vital Sign    Worried About Running Out of Food in the Last Year: Never true    Ran Out of Food in the Last Year: Never true  Transportation Needs: No Transportation Needs (05/09/2023)   PRAPARE - Administrator, Civil Service (Medical): No    Lack of Transportation (Non-Medical): No  Physical Activity: Not on file  Stress: Not on file  Social Connections: Moderately Integrated (05/09/2023)   Social Connection and Isolation Panel [NHANES]    Frequency of Communication with Friends and Family: More than three times a week    Frequency of Social Gatherings with Friends and Family: Three times a week    Attends Religious Services: More than 4 times per year    Active Member of Clubs or Organizations: Yes    Attends Banker Meetings: Never    Marital Status: Divorced     Review of Systems: A 12 point ROS discussed and pertinent positives are indicated in the HPI above.  All other systems are negative.  Vital Signs: BP 107/64 (BP Location: Left Arm)   Pulse 64   Temp 98.2 F (36.8 C) (Oral)   Resp 16   Ht 5' 9 (1.753 m)   Wt 127 lb 3.3 oz (57.7 kg)   LMP 01/24/2000   SpO2 100%   BMI 18.78 kg/m    Advance Care Plan: The advanced care place/surrogate decision maker was discussed at the time of visit and the patient did not wish to discuss or was not able to name a surrogate decision maker or provide an advance care plan.  Physical Exam Vitals reviewed.  Constitutional:      Appearance: Normal appearance.  HENT:     Head: Normocephalic and atraumatic.  Eyes:     Extraocular Movements: Extraocular movements intact.  Cardiovascular:     Rate and Rhythm: Normal rate and regular rhythm.  Pulmonary:     Effort: Pulmonary effort is normal. No respiratory distress.     Breath sounds: Normal breath sounds.  Abdominal:     Palpations: Abdomen is soft.  Musculoskeletal:        General: Normal range of motion.     Cervical back: Normal range of motion.  Skin:    General: Skin is warm and dry.  Neurological:     General: No focal deficit present.     Mental Status: She is alert and oriented to person, place, and time.  Psychiatric:        Mood and Affect: Mood normal.        Behavior: Behavior normal.        Thought Content: Thought content normal.        Judgment: Judgment normal.     Imaging: CLINICAL DATA:  Left lower quadrant pain   EXAM: CT ABDOMEN AND PELVIS WITH CONTRAST   TECHNIQUE: Multidetector CT imaging of the abdomen and pelvis was performed using the standard protocol following bolus administration of intravenous contrast.   RADIATION DOSE REDUCTION: This exam was performed according to the departmental dose-optimization program which includes automated exposure control, adjustment of the mA and/or kV according to patient size and/or use of iterative reconstruction technique.   CONTRAST:  50mL OMNIPAQUE  IOHEXOL  350 MG/ML SOLN   COMPARISON:  CT 10/30/2015   FINDINGS: Lower chest: Lung bases demonstrate bandlike scarring within the lower lobes and right middle lobe. No acute airspace disease.   Hepatobiliary: Subcentimeter hypodensities in the liver  too small to further characterize. No calcified gallstone or biliary dilatation.   Pancreas: Unremarkable. No pancreatic ductal dilatation or surrounding inflammatory changes.   Spleen: Normal in size without focal abnormality.   Adrenals/Urinary Tract: Adrenal glands show no focal abnormality. Kidneys show no hydronephrosis. Simple bilateral renal cysts for which no imaging follow-up is recommended. 2.1 cm superior pole exophytic complex cyst. Bladder partially obscured   Stomach/Bowel: Stomach nonenlarged. No dilated small bowel. No acute bowel wall thickening. Diverticular disease of the left colon.   Vascular/Lymphatic: Advanced aortic atherosclerosis. No aneurysm. Mildly enlarged left Peri aortic node measuring 19 mm on series 3, image 42.   Reproductive: Uterus is unremarkable. Heterogenous left adnexal mass lesion measuring 6.8 x 5.8 by 7 cm concerning for neoplasm.   Other: Small volume free fluid within the pelvis. Evidence for peritoneal metastatic disease. 10 mm left upper quadrant peritoneal nodule on series 3, image 35. Right mid quadrant soft tissue mass measuring 5.7 x 2.4 cm on series 3, image 44. Left lower quadrant soft tissue mass measuring 8.1 by 2.5 by 12.1 cm. Soft tissue nodule in the right colic gutter measuring 1.7 cm on series 3, image 55. Right posterior pelvic soft tissue mass measuring 3.6 x 2.6 cm on series 3, image 76. Additional small mesenteric nodules are noted.   Musculoskeletal: No acute or suspicious osseous abnormality. Left hip replacement with artifact.   IMPRESSION: 1. Large heterogeneous cystic and solid left adnexal mass highly suspicious for ovarian neoplasm. 2. Positive for peritoneal metastatic disease as evidence by multiple intra-abdominal and pelvic tumor implants. Trace volume ascites in the pelvis. Several mildly enlarged retroperitoneal lymph nodes also suspicious for metastatic disease. 3. Diverticular disease of the left  colon without acute inflammatory process. 4. Aortic atherosclerosis.   Aortic Atherosclerosis (ICD10-I70.0).     Electronically Signed   By: Luke Bun M.D.   On: 05/08/2023 19:53  Labs:  CBC: Recent Labs    12/29/22 0851 05/08/23 1317 05/09/23 0702  WBC 4.4 6.8 6.3  HGB 11.5* 12.7 12.1  HCT 35.3* 38.6 36.0  PLT 219 363 340    COAGS: No results for input(s): INR, APTT in the last 8760 hours.  BMP: Recent Labs    12/29/22 0851 05/08/23 1317 05/09/23 0702  NA 139 138 137  K 4.2 4.4 4.5  CL 106 104 101  CO2 28 23 23   GLUCOSE 96 113* 100*  BUN 17 19 26*  CALCIUM  9.2 9.7 9.5  CREATININE 0.87 0.98 0.97  GFRNONAA >60 59* 59*    LIVER FUNCTION TESTS: Recent Labs    12/29/22 0851 05/08/23 1317 05/09/23 0702  BILITOT 0.4 0.8 1.0  AST 27 28 25   ALT 22 12 11   ALKPHOS 61 60 57  PROT 7.3 7.9 7.5  ALBUMIN  3.9 3.6 3.5    TUMOR MARKERS: No results for input(s): AFPTM, CEA, CA199, CHROMGRNA in the last 8760 hours.  Assessment and Plan:  Large heterogeneous cystic and solid left adnexal mass highly suspicious for ovarian neoplasm.  Positive for peritoneal metastatic disease as evidence by multiple intra-abdominal and pelvic tumor implants.  Several mildly enlarged retroperitoneal lymph nodes also suspicious for metastatic disease.  Images reviewed by Dr. Alona.  Will plan for image guided biopsy tomorrow  by Dr. Hughes. Will also try and place the port as well if IR schedule allows. If not, she can be scheduled as outpatient for [facility] placement.  Risks and benefits of peritoneal biopsy was discussed with the patient and/or patient's family including, but not limited to bleeding, infection, damage to adjacent structures or low yield requiring additional tests.  Risks and benefits of image guided port-a-catheter placement was discussed with the patient including, but not limited to bleeding, infection, pneumothorax, or fibrin sheath development and  need for additional procedures.  All of the patient's questions were answered, patient is agreeable to proceed. Consent signed and in chart.  Thank you for allowing our service to participate in Catherine Lowery 's care.  Electronically Signed: SARI GORMAN LAMP, PA-C   05/09/2023, 4:25 PM      I spent a total of 40 Minutes  in face to face in clinical consultation, greater than 50% of which was counseling/coordinating care for Biopsy and port placement.

## 2023-05-09 NOTE — Progress Notes (Signed)
 Pt transferred from ED to 6N13. Alert and fully oriented x 4, stable hemodynamically, afebrile, normal respiratory efforts, no acute distress at arrival.   Left lower abdominal pain is tolerated well by Tylenol , Ibuprofen  and Fentanyl  PRN. She is able to rest, no distress overnight.  We will continue to monitor.   Wendi Dash, RN

## 2023-05-09 NOTE — Assessment & Plan Note (Signed)
 05-09-2023 has nicotine patch and prn nicotine gum

## 2023-05-09 NOTE — Progress Notes (Addendum)
 PROGRESS NOTE    Catherine Lowery  FMW:[id number] DOB: 19-Feb-1946 DOA: 05/08/2023 PCP: [name], NP  Subjective: Pt seen and examined. Abd is hurting again. Requesting more IV fentanyl . Heard back from gyn/onc. They are operating at [facility] today and will see patient in consult this afternoon.   Hospital Course: HPI: Catherine Lowery is a 78 y.o. female with medical history significant of history of CKD stage III, history of breast cancer on Arimidex , essential hypertension, reactive airway disease, and chronic vitamin D  deficiency presented emergency department with multiple complaint including cough, nasal congestion since 12/27, diarrhea until 12/30 now having constipation and abdominal cramping.  Patient reported to prednisone  and doxycycline  which helped with improvement of nasal congestion and cough.   Reported that she has noticed left-sided lower abdominal pain for last 2 weeks.  Also reported associated constipation for last 6 to 7 days.  Patient denies any nausea, vomiting.  Abdominal cramping.   She also has poor appetite, generalized weakness and lost weight. Patient denies any fever, chill, chest pain and shortness of breath.   ED Course:  At presentation to ED patient found hypertensive blood pressure 161/83 otherwise hemodynamically stable. CBC showing low MCV 69 which is chronic in nature.  Otherwise unremarkable. CMP showing evidence of CKD stage IIIa otherwise normal finding. Pending UA.   CT abdomen pelvis showed: IMPRESSION: 1. Large heterogeneous cystic and solid left adnexal mass highly suspicious for ovarian neoplasm. 2. Positive for peritoneal metastatic disease as evidence by multiple intra-abdominal and pelvic tumor implants. Trace volume ascites in the pelvis. Several mildly enlarged retroperitoneal lymph nodes also suspicious for metastatic disease. 3. Diverticular disease of the left colon without acute inflammatory process. 4. Aortic atherosclerosis.    Due to significant abdominal pain in the ED patient has been DC'd fentanyl  and Toradol  dose. ED for reported that patient has history of aortic aneurysm which is 3 cm size.  Given patient has a new finding of ovarian neoplasm planning to obtaining CT chest without contrast to follow-up with any metastasis and aortic aneurysm as well.   Hospital has been contacted for further evaluation management of inpatient ovarian cancer workup and pain management.   I have consulted and informed Dr. Hoy Masters over phone.  Stated that will order serum tumor marker and oncology team will evaluate patient in the a.m.  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 2. Positive for peritoneal metastatic disease as evidence by multiple intra-abdominal and pelvic tumor implants. Trace volume ascites in the pelvis. Several mildly enlarged retroperitoneal lymph nodes also suspicious for metastatic disease. 3. Diverticular disease of the left colon without acute inflammatory process. 4. Aortic atherosclerosis. CTA  No aortic aneurysm or dissection. 2. 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 They will see her this afternoon after they finish up at [facility]. Pt aware of consultation this afternoon. pt's regular oncologist(Iruku) made aware of  pt's admission. Continue with IV fentanyl  prn. Will add po norco 5-10 mg prn.  Further diagnostic workup and therapeutic intervention per gyn/onc consult.  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  Abdominal pain 05-09-2023 due to new ovarian mass.  COPD (chronic obstructive pulmonary disease) with chronic bronchitis (HCC) 05-09-2023 on RA. Not exacerbated.  Essential tremor 05-09-2023 stable.  Cigarette smoker 05-09-2023 has nicotine  patch and prn nicotine  gum  Reactive airway disease 05-09-2023 stable  CKD stage 3a, GFR 45-59 ml/min (HCC) 05-09-2023 stable. Scr 0.97  Essential hypertension 05-09-2023 continue with inderal  20 mg every day, norvasc  10 mg qday.   DVT prophylaxis: heparin  injection 5,000 Units Start: 05/09/23 0600 SCDs Start: 05/08/23 2304 Place TED hose Start: 05/08/23 2304    Code Status: Limited: Do not attempt resuscitation (DNR) -DNR-LIMITED -Do Not Intubate/DNI  Family Communication: no family at bedside Disposition Plan: return home Reason for continuing need for hospitalization: awaiting gyn/onc consult.  Objective: Vitals:   05/09/23 0729 05/09/23 0741 05/09/23 0842 05/09/23 1057  BP: 128/70  128/70   Pulse: 83  83   Resp: 16     Temp: 98.2 F (36.8 C)     TempSrc: Oral     SpO2: 99% 97%    Weight:      Height:    5' 9 (1.753 m)    Intake/Output Summary (Last 24 hours) at 05/09/2023 1248 Last data filed at 05/09/2023 1203 Gross per 24 hour  Intake 506 ml  Output --  Net 506 ml   Filed Weights   05/08/23 1309 05/09/23 0033 05/09/23 0230  Weight: 63 kg 57.7 kg 57.7 kg    Examination:  Physical Exam Vitals and nursing note reviewed.  Constitutional:      General: She is not in acute distress.    Appearance: She is not toxic-appearing or diaphoretic.  HENT:     Head: Normocephalic and atraumatic.     Nose: Nose normal.  Eyes:     General: No scleral icterus. Cardiovascular:     Rate and  Rhythm: Normal rate and regular rhythm.     Pulses: Normal pulses.  Pulmonary:     Effort: Pulmonary effort is normal. No respiratory distress.     Breath sounds: Normal breath sounds.  Abdominal:     General: Bowel sounds are normal. There is no distension.     Palpations: Abdomen is soft.     Tenderness: There is abdominal tenderness in the right lower quadrant and left lower quadrant. There is no guarding or rebound.  Musculoskeletal:     Right lower leg: No edema.     Left lower leg: No edema.  Skin:    General: Skin is warm and dry.     Capillary Refill: Capillary refill takes less than 2 seconds.  Neurological:     Mental Status: She is alert and oriented to person, place, and time.     Data Reviewed: I have personally reviewed following labs and imaging studies  CBC: Recent Labs  Lab 05/08/23 1317 05/09/23 0702  WBC 6.8 6.3  HGB 12.7 12.1  HCT 38.6 36.0  MCV 69.8* 68.7*  PLT 363 340   Basic Metabolic Panel: Recent Labs  Lab 05/08/23 1317 05/09/23 0702  NA 138 137  K 4.4 4.5  CL 104 101  CO2 23 23  GLUCOSE 113* 100*  BUN 19 26*  CREATININE 0.98 0.97  CALCIUM  9.7  9.5   GFR: Estimated Creatinine Clearance: 42.8 mL/min (by C-G formula based on SCr of 0.97 mg/dL). Liver Function Tests: Recent Labs  Lab 05/08/23 1317 05/09/23 0702  AST 28 25  ALT 12 11  ALKPHOS 60 57  BILITOT 0.8 1.0  PROT 7.9 7.5  ALBUMIN  3.6 3.5   Recent Labs  Lab 05/08/23 1317  LIPASE 32   Anemia Panel: Recent Labs    05/08/23 1317 05/09/23 0702  VITAMINB12  --  285  FOLATE 20.0  --   FERRITIN  --  263  TIBC  --  228*  IRON  --  59  RETICCTPCT 1.4  --     Radiology Studies: CT Angio Chest Aorta W and/or Wo Contrast Result Date: 05/09/2023 CLINICAL DATA:  Constipation and abdominal pain. Acute aortic syndrome suspected. EXAM: CT ANGIOGRAPHY CHEST WITH CONTRAST TECHNIQUE: Multidetector CT imaging of the chest was performed using the standard protocol during bolus  administration of intravenous contrast. Multiplanar CT image reconstructions and MIPs were obtained to evaluate the vascular anatomy. RADIATION DOSE REDUCTION: This exam was performed according to the departmental dose-optimization program which includes automated exposure control, adjustment of the mA and/or kV according to patient size and/or use of iterative reconstruction technique. CONTRAST:  60mL OMNIPAQUE  IOHEXOL  350 MG/ML SOLN COMPARISON:  Radiograph 04/27/2023 and CT chest 05/15/2022 FINDINGS: Cardiovascular: Normal heart size. No pericardial effusion. Aortic atherosclerotic calcification. No aortic aneurysm, dissection, or penetrating atherosclerotic ulcer. Coronary artery calcification. Mediastinum/Nodes: Trachea and esophagus are unremarkable. No thoracic adenopathy. Lungs/Pleura: Centrilobular emphysema greatest in the apices. Bronchial wall thickening and mucous plugging in the lower lobes. Right lower lobe atelectasis. Clustered centrilobular micro nodules and tree-in-bud opacities in the right upper lobe. No pleural effusion or pneumothorax. Upper Abdomen: No acute abnormality. Musculoskeletal: No acute fracture.  Thoracic spondylosis. Review of the MIP images confirms the above findings. IMPRESSION: 1. No aortic aneurysm or dissection. 2. Emphysema and chronic bronchitis. Aortic Atherosclerosis (ICD10-I70.0) and Emphysema (ICD10-J43.9). Electronically Signed   By: Norman Gatlin M.D.   On: 05/09/2023 00:15   CT ABDOMEN PELVIS W CONTRAST Result Date: 05/08/2023 CLINICAL DATA:  Left lower quadrant pain EXAM: CT ABDOMEN AND PELVIS WITH CONTRAST TECHNIQUE: Multidetector CT imaging of the abdomen and pelvis was performed using the standard protocol following bolus administration of intravenous contrast. RADIATION DOSE REDUCTION: This exam was performed according to the departmental dose-optimization program which includes automated exposure control, adjustment of the mA and/or kV according to patient  size and/or use of iterative reconstruction technique. CONTRAST:  50mL OMNIPAQUE  IOHEXOL  350 MG/ML SOLN COMPARISON:  CT 10/30/2015 FINDINGS: Lower chest: Lung bases demonstrate bandlike scarring within the lower lobes and right middle lobe. No acute airspace disease. Hepatobiliary: Subcentimeter hypodensities in the liver too small to further characterize. No calcified gallstone or biliary dilatation. Pancreas: Unremarkable. No pancreatic ductal dilatation or surrounding inflammatory changes. Spleen: Normal in size without focal abnormality. Adrenals/Urinary Tract: Adrenal glands show no focal abnormality. Kidneys show no hydronephrosis. Simple bilateral renal cysts for which no imaging follow-up is recommended. 2.1 cm superior pole exophytic complex cyst. Bladder partially obscured Stomach/Bowel: Stomach nonenlarged. No dilated small bowel. No acute bowel wall thickening. Diverticular disease of the left colon. Vascular/Lymphatic: Advanced aortic atherosclerosis. No aneurysm. Mildly enlarged left Peri aortic node measuring 19 mm on series 3, image 42. Reproductive: Uterus is unremarkable. Heterogenous left adnexal mass lesion measuring 6.8 x 5.8 by 7 cm concerning for neoplasm. Other: Small volume free fluid within the pelvis. Evidence for peritoneal metastatic disease.  10 mm left upper quadrant peritoneal nodule on series 3, image 35. Right mid quadrant soft tissue mass measuring 5.7 x 2.4 cm on series 3, image 44. Left lower quadrant soft tissue mass measuring 8.1 by 2.5 by 12.1 cm. Soft tissue nodule in the right colic gutter measuring 1.7 cm on series 3, image 55. Right posterior pelvic soft tissue mass measuring 3.6 x 2.6 cm on series 3, image 76. Additional small mesenteric nodules are noted. Musculoskeletal: No acute or suspicious osseous abnormality. Left hip replacement with artifact. IMPRESSION: 1. Large heterogeneous cystic and solid left adnexal mass highly suspicious for ovarian neoplasm. 2. Positive  for peritoneal metastatic disease as evidence by multiple intra-abdominal and pelvic tumor implants. Trace volume ascites in the pelvis. Several mildly enlarged retroperitoneal lymph nodes also suspicious for metastatic disease. 3. Diverticular disease of the left colon without acute inflammatory process. 4. Aortic atherosclerosis. Aortic Atherosclerosis (ICD10-I70.0). Electronically Signed   By: Luke Bun M.D.   On: 05/08/2023 19:53    Scheduled Meds:  amLODipine   10 mg Oral Daily   anastrozole   1 mg Oral Daily   atorvastatin   40 mg Oral Daily   doxycycline   100 mg Oral BID   feeding supplement  237 mL Oral BID BM   fluticasone  furoate-vilanterol  1 puff Inhalation Daily   And   umeclidinium bromide   1 puff Inhalation Daily   heparin   5,000 Units Subcutaneous Q8H   levothyroxine   50 mcg Oral Daily   [START ON 05/10/2023] nicotine   14 mg Transdermal Daily   pantoprazole   40 mg Oral Daily   polyethylene glycol  17 g Oral Daily   propranolol   20 mg Oral Daily   senna-docusate  1 tablet Oral BID   sodium chloride  flush  3 mL Intravenous Q12H   sodium chloride  flush  3-10 mL Intravenous Q12H   Continuous Infusions:   LOS: 1 day   Time spent: 40 minutes  Camellia Door, DO  [facility]  05/09/2023, 12:48 PM

## 2023-05-10 ENCOUNTER — Inpatient Hospital Stay (HOSPITAL_COMMUNITY): Payer: Medicare HMO

## 2023-05-10 DIAGNOSIS — D4959 Neoplasm of unspecified behavior of other genitourinary organ: Secondary | ICD-10-CM | POA: Diagnosis not present

## 2023-05-10 HISTORY — PX: IR US GUIDE BX ASP/DRAIN: IMG2392

## 2023-05-10 HISTORY — PX: IR IMAGING GUIDED PORT INSERTION: IMG5740

## 2023-05-10 LAB — CANCER ANTIGEN 27.29: CA 27.29: 44.1 U/mL — ABNORMAL HIGH (ref 0.0–38.6)

## 2023-05-10 LAB — CA 125: Cancer Antigen (CA) 125: 137 U/mL — ABNORMAL HIGH (ref 0.0–38.1)

## 2023-05-10 LAB — CEA: CEA: 3.4 ng/mL (ref 0.0–4.7)

## 2023-05-10 LAB — CANCER ANTIGEN 19-9: CA 19-9: 596 U/mL — ABNORMAL HIGH (ref 0–35)

## 2023-05-10 LAB — PROTIME-INR
INR: 1.1 (ref 0.8–1.2)
Prothrombin Time: 14.3 s (ref 11.4–15.2)

## 2023-05-10 MED ORDER — FLEET ENEMA RE ENEM
1.0000 | ENEMA | Freq: Once | RECTAL | Status: AC
  Administered 2023-05-10: 1 via RECTAL
  Filled 2023-05-10: qty 1

## 2023-05-10 MED ORDER — LIDOCAINE-EPINEPHRINE 1 %-1:100000 IJ SOLN
INTRAMUSCULAR | Status: AC
  Filled 2023-05-10: qty 1

## 2023-05-10 MED ORDER — HEPARIN SOD (PORK) LOCK FLUSH 100 UNIT/ML IV SOLN
INTRAVENOUS | Status: AC
  Filled 2023-05-10: qty 5

## 2023-05-10 MED ORDER — LIDOCAINE-EPINEPHRINE 1 %-1:100000 IJ SOLN
20.0000 mL | Freq: Once | INTRAMUSCULAR | Status: AC
  Administered 2023-05-10: 20 mL via INTRADERMAL

## 2023-05-10 MED ORDER — SENNOSIDES-DOCUSATE SODIUM 8.6-50 MG PO TABS: 1.0000 | ORAL_TABLET | Freq: Two times a day (BID) | ORAL | Status: DC

## 2023-05-10 MED ORDER — FLUMAZENIL 0.5 MG/5ML IV SOLN
INTRAVENOUS | Status: AC
Start: 2023-05-10 — End: ?
  Filled 2023-05-10: qty 5

## 2023-05-10 MED ORDER — MIDAZOLAM HCL 2 MG/2ML IJ SOLN
INTRAMUSCULAR | Status: AC | PRN
  Administered 2023-05-10 (×3): .5 mg via INTRAVENOUS
  Administered 2023-05-10: 1 mg via INTRAVENOUS
  Administered 2023-05-10: .5 mg via INTRAVENOUS

## 2023-05-10 MED ORDER — POLYETHYLENE GLYCOL 3350 17 G PO PACK: 34.0000 g | PACK | Freq: Every day | ORAL | Status: DC

## 2023-05-10 MED ORDER — BISACODYL 5 MG PO TBEC: 10.0000 mg | DELAYED_RELEASE_TABLET | Freq: Every day | ORAL | Status: DC | PRN

## 2023-05-10 MED ORDER — BISACODYL 10 MG RE SUPP
10.0000 mg | Freq: Every day | RECTAL | Status: DC | PRN
  Administered 2023-05-10: 10 mg via RECTAL
  Filled 2023-05-10: qty 1

## 2023-05-10 MED ORDER — HEPARIN SOD (PORK) LOCK FLUSH 100 UNIT/ML IV SOLN
INTRAVENOUS | Status: AC | PRN
  Administered 2023-05-10: 500 [IU]

## 2023-05-10 MED ORDER — FENTANYL CITRATE (PF) 100 MCG/2ML IJ SOLN
INTRAMUSCULAR | Status: AC
  Filled 2023-05-10: qty 4

## 2023-05-10 MED ORDER — HEPARIN SODIUM (PORCINE) 5000 UNIT/ML IJ SOLN: 5000.0000 [IU] | Freq: Three times a day (TID) | INTRAMUSCULAR | Status: DC

## 2023-05-10 MED ORDER — NALOXONE HCL 2 MG/2ML IJ SOSY
PREFILLED_SYRINGE | INTRAMUSCULAR | Status: AC
  Filled 2023-05-10: qty 2

## 2023-05-10 MED ORDER — FENTANYL CITRATE (PF) 100 MCG/2ML IJ SOLN
INTRAMUSCULAR | Status: AC | PRN
  Administered 2023-05-10: 25 ug via INTRAVENOUS
  Administered 2023-05-10: 50 ug via INTRAVENOUS
  Administered 2023-05-10 (×2): 25 ug via INTRAVENOUS

## 2023-05-10 MED ORDER — MIDAZOLAM HCL 2 MG/2ML IJ SOLN
INTRAMUSCULAR | Status: AC
  Filled 2023-05-10: qty 4

## 2023-05-10 NOTE — Progress Notes (Signed)
 Interventional Radiology Brief Note:  Patient to [facility] this AM for inpatient biopsy and Port-A-Cath placement.  In reviewing code status prior to procedure patient indicates she would like to reverse her DNR and would like to be FULL CODE. Due to limited role of IR in patient's care, her primary care team has been notified so that additional conversation can be held with the patient. DNR will be suspended during our procedure today as is typical during a sedation case. Patient aware and agreeable.   Randye Treichler, MS RD PA-C

## 2023-05-10 NOTE — Procedures (Signed)
Pre Procedure Dx: Poor venous access Post Procedural Dx: Same  Successful placement of right IJ approach port-a-cath with tip at the superior caval atrial junction. The catheter is ready for immediate use.  Estimated Blood Loss: Trace  Complications: None immediate.  Jay Haile Bosler, MD Pager #: [phone]   

## 2023-05-10 NOTE — Discharge Summary (Signed)
 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  Diet recommendation:   Regular.  Wound care: None.  Code status: Full.   History of Present Illness:   Catherine Lowery is a 78 y.o. female with medical history significant of history of CKD stage III, history of breast cancer on Arimidex , essential hypertension, reactive airway disease, and chronic vitamin D  deficiency presented emergency department with multiple complaint including cough, nasal congestion since 12/27, diarrhea until 12/30 now having constipation and abdominal cramping.  Patient reported to prednisone  and doxycycline  which helped with improvement of nasal congestion and cough.   Reported that she has noticed left-sided lower abdominal pain for last 2 weeks.  Also reported associated constipation for last 6 to 7 days.  Patient denies any nausea, vomiting.  Abdominal cramping.   She also has poor appetite, generalized weakness and lost weight. Patient denies any fever, chill, chest pain and shortness of breath.   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   COPD (chronic obstructive pulmonary disease) with chronic bronchitis (HCC) -on RA. Not exacerbated.   Essential tremor - stable.   Cigarette smoker -encourage cessation   Reactive airway disease - stable   CKD stage 3a, GFR 45-59 ml/min (HCC) - stable. -   Essential hypertension - continue with inderal  20 mg every day, norvasc  10 mg qday.   Constipation -bowel regimen with successful BM on 1/9 -eating well, no nausea    Medical Consultants:    oncology  Discharge Exam:   Vitals:   05/10/23 1100 05/10/23 1115  BP: (!) 136/92 135/87  Pulse: 98 83  Resp: 18 18  Temp:    SpO2: 95% 98%   Vitals:   05/10/23 1050 05/10/23 1055 05/10/23 1100 05/10/23 1115  BP: (!) 144/1 (!) 142/85 (!) 136/92 135/87  Pulse: 79 79 98 83  Resp: 13 14 18 18   Temp:      TempSrc:      SpO2: 97% 97% 95% 98%  Weight:      Height:        General exam: Appears calm and comfortable.  The results of significant diagnostics from this hospitalization (including imaging, microbiology, ancillary and laboratory) are listed below for reference.     Procedures and Diagnostic Studies:   CT Angio Chest Aorta W and/or Wo Contrast Result Date: 05/09/2023 CLINICAL DATA:  Constipation and abdominal pain. Acute aortic syndrome suspected. EXAM: CT ANGIOGRAPHY CHEST WITH CONTRAST TECHNIQUE: Multidetector CT imaging of the chest was performed using the standard protocol during bolus  administration of intravenous contrast. Multiplanar CT image reconstructions and MIPs were obtained to evaluate the vascular anatomy. RADIATION DOSE REDUCTION: This exam was performed according to the departmental dose-optimization program which includes automated exposure control, adjustment of the mA and/or kV according to patient size and/or use of iterative reconstruction technique. CONTRAST:  60mL OMNIPAQUE   IOHEXOL  350 MG/ML SOLN COMPARISON:  Radiograph 04/27/2023 and CT chest 05/15/2022 FINDINGS: Cardiovascular: Normal heart size. No pericardial effusion. Aortic atherosclerotic calcification. No aortic aneurysm, dissection, or penetrating atherosclerotic ulcer. Coronary artery calcification. Mediastinum/Nodes: Trachea and esophagus are unremarkable. No thoracic adenopathy. Lungs/Pleura: Centrilobular emphysema greatest in the apices. Bronchial wall thickening and mucous plugging in the lower lobes. Right lower lobe atelectasis. Clustered centrilobular micro nodules and tree-in-bud opacities in the right upper lobe. No pleural effusion or pneumothorax. Upper Abdomen: No acute abnormality. Musculoskeletal: No acute fracture.  Thoracic spondylosis. Review of the MIP images confirms the above findings. IMPRESSION: 1. No aortic aneurysm or dissection. 2. Emphysema and chronic bronchitis. Aortic Atherosclerosis (ICD10-I70.0) and Emphysema (ICD10-J43.9). Electronically Signed   By: Norman Gatlin M.D.   On: 05/09/2023 00:15   CT ABDOMEN PELVIS W CONTRAST Result Date: 05/08/2023 CLINICAL DATA:  Left lower quadrant pain EXAM: CT ABDOMEN AND PELVIS WITH CONTRAST TECHNIQUE: Multidetector CT imaging of the abdomen and pelvis was performed using the standard protocol following bolus administration of intravenous contrast. RADIATION DOSE REDUCTION: This exam was performed according to the departmental dose-optimization program which includes automated exposure control, adjustment of the mA and/or kV according to patient size and/or use of iterative reconstruction technique. CONTRAST:  50mL OMNIPAQUE  IOHEXOL  350 MG/ML SOLN COMPARISON:  CT 10/30/2015 FINDINGS: Lower chest: Lung bases demonstrate bandlike scarring within the lower lobes and right middle lobe. No acute airspace disease. Hepatobiliary: Subcentimeter hypodensities in the liver too small to further characterize. No calcified gallstone or biliary dilatation. Pancreas:  Unremarkable. No pancreatic ductal dilatation or surrounding inflammatory changes. Spleen: Normal in size without focal abnormality. Adrenals/Urinary Tract: Adrenal glands show no focal abnormality. Kidneys show no hydronephrosis. Simple bilateral renal cysts for which no imaging follow-up is recommended. 2.1 cm superior pole exophytic complex cyst. Bladder partially obscured Stomach/Bowel: Stomach nonenlarged. No dilated small bowel. No acute bowel wall thickening. Diverticular disease of the left colon. Vascular/Lymphatic: Advanced aortic atherosclerosis. No aneurysm. Mildly enlarged left Peri aortic node measuring 19 mm on series 3, image 42. Reproductive: Uterus is unremarkable. Heterogenous left adnexal mass lesion measuring 6.8 x 5.8 by 7 cm concerning for neoplasm. Other: Small volume free fluid within the pelvis. Evidence for peritoneal metastatic disease. 10 mm left upper quadrant peritoneal nodule on series 3, image 35. Right mid quadrant soft tissue mass measuring 5.7 x 2.4 cm on series 3, image 44. Left lower quadrant soft tissue mass measuring 8.1 by 2.5 by 12.1 cm. Soft tissue nodule in the right colic gutter measuring 1.7 cm on series 3, image 55. Right posterior pelvic soft tissue mass measuring 3.6 x 2.6 cm on series 3, image 76. Additional small mesenteric nodules are noted. Musculoskeletal: No acute or suspicious osseous abnormality. Left hip replacement with artifact. IMPRESSION: 1. Large heterogeneous cystic and solid left adnexal mass highly suspicious for ovarian neoplasm. 2. Positive for peritoneal metastatic disease as evidence by multiple intra-abdominal and pelvic tumor implants. Trace volume ascites in the pelvis. Several mildly enlarged retroperitoneal lymph nodes also suspicious for metastatic disease. 3. Diverticular disease of the left colon without acute inflammatory process. 4. Aortic atherosclerosis. Aortic Atherosclerosis (ICD10-I70.0). Electronically Signed   By: Luke  Scott  M.D.   On: 05/08/2023 19:53     Labs:   Basic Metabolic Panel: Recent Labs  Lab 05/08/23 1317 05/09/23 0702  NA 138 137  K 4.4 4.5  CL 104 101  CO2 23 23  GLUCOSE 113* 100*  BUN 19 26*  CREATININE 0.98 0.97  CALCIUM  9.7 9.5   GFR Estimated Creatinine Clearance: 42.8 mL/min (by C-G formula based on SCr of 0.97 mg/dL). Liver Function Tests: Recent Labs  Lab 05/08/23 1317 05/09/23 0702  AST 28 25  ALT 12 11  ALKPHOS 60 57  BILITOT 0.8 1.0  PROT 7.9 7.5  ALBUMIN  3.6 3.5   Recent Labs  Lab 05/08/23 1317  LIPASE 32   No results for input(s): AMMONIA in the last 168 hours. Coagulation profile Recent Labs  Lab 05/10/23 0506  INR 1.1    CBC: Recent Labs  Lab 05/08/23 1317 05/09/23 0702  WBC 6.8 6.3  HGB 12.7 12.1  HCT 38.6 36.0  MCV 69.8* 68.7*  PLT 363 340   Cardiac Enzymes: No results for input(s): CKTOTAL, CKMB, CKMBINDEX, TROPONINI in the last 168 hours. BNP: Invalid input(s): POCBNP CBG: No results for input(s): GLUCAP in the last 168 hours. D-Dimer No results for input(s): DDIMER in the last 72 hours. Hgb A1c No results for input(s): HGBA1C in the last 72 hours. Lipid Profile No results for input(s): CHOL, HDL, LDLCALC, TRIG, CHOLHDL, LDLDIRECT in the last 72 hours. Thyroid  function studies No results for input(s): TSH, T4TOTAL, T3FREE, THYROIDAB in the last 72 hours.  Invalid input(s): FREET3 Anemia work up Recent Labs    05/08/23 1317 05/09/23 0702  VITAMINB12  --  285  FOLATE 20.0  --   FERRITIN  --  263  TIBC  --  228*  IRON  --  59  RETICCTPCT 1.4  --    Microbiology No results found for this or any previous visit (from the past 240 hours).   Discharge Instructions:   Discharge Instructions     Ambulatory Referral for Lung Cancer Scre   Complete by: As directed    Diet - low sodium heart healthy   Complete by: As directed    Discharge instructions   Complete by: As directed     Bowel regimen -- will need to adjust for better routine   Increase activity slowly   Complete by: As directed       Allergies as of 05/10/2023       Reactions   Morphine And Codeine Itching   Other Itching, Nausea And Vomiting   PAIN MEDICINES   Percocet [oxycodone -acetaminophen ] Nausea And Vomiting        Medication List     STOP taking these medications    dextromethorphan-guaiFENesin  30-600 MG 12hr tablet Commonly known as: MUCINEX  DM   doxycycline  100 MG capsule Commonly known as: MONODOX    predniSONE  10 MG tablet Commonly known as: DELTASONE        TAKE these medications    albuterol  108 (90 Base) MCG/ACT inhaler Commonly known as: VENTOLIN  HFA Inhale 2 puffs into the lungs every 4 (four) hours as needed for wheezing or shortness of breath.   amLODipine  10 MG tablet Commonly known as: NORVASC  Take 1 tablet (10 mg total) by mouth daily.   anastrozole  1 MG tablet Commonly known as: ARIMIDEX  Take 1 tablet (1 mg total) by mouth daily.   aspirin  325 MG tablet Commonly known as: Aspirin  Adult enteric coated; may use OTC What changed:  how much to  take how to take this when to take this   atorvastatin  40 MG tablet Commonly known as: LIPITOR Take 1 tablet (40 mg total) by mouth daily.   bisacodyl  5 MG EC tablet Commonly known as: DULCOLAX Take 2 tablets (10 mg total) by mouth daily as needed for moderate constipation.   FISH OIL PO Take 1,000 mg by mouth daily.   levothyroxine  50 MCG tablet Commonly known as: SYNTHROID  Take 1 tablet (50 mcg total) by mouth daily.   losartan  25 MG tablet Commonly known as: COZAAR  Take 25 mg by mouth daily.   polyethylene glycol 17 g packet Commonly known as: MIRALAX  / GLYCOLAX  Take 34 g by mouth daily. Start taking on: May 11, 2023   primidone  50 MG tablet Commonly known as: MYSOLINE  Take 2 tablets (100 mg total) by mouth every morning.   propranolol  40 MG tablet Commonly known as: INDERAL  Take 0.5  tablets (20 mg total) by mouth 2 (two) times daily. What changed: when to take this   senna-docusate 8.6-50 MG tablet Commonly known as: Senokot-S Take 1 tablet by mouth 2 (two) times daily.   Trelegy Ellipta 100-62.5-25 MCG/ACT Aepb Generic drug: Fluticasone -Umeclidin-Vilant Inhale 1 puff into the lungs daily.   Vitamin D3 50 MCG (2000 UT) capsule Take 1 capsule (2,000 Units total) by mouth daily.   Voltaren 1 % Gel Generic drug: diclofenac Sodium APPLY TOPICALLY 4 TIMES DAILY AS NEEDED          Time coordinating discharge: 45 min  Signed:  Harlene RAYMOND Bowl DO  [facility] 05/10/2023, 2:40 PM

## 2023-05-10 NOTE — Plan of Care (Signed)
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
## 2023-05-10 NOTE — Procedures (Signed)
 Pre Procedure Dx: Omental mass Post Procedural Dx: Same  Technically successful US guided biopsy of indeterminate mass within the ventral aspect of the left mid abdomen.  EBL: Trace No immediate complications.   Katherina Right, MD Pager #: [phone]

## 2023-05-11 ENCOUNTER — Telehealth: Payer: Self-pay | Admitting: Oncology

## 2023-05-11 NOTE — Telephone Encounter (Signed)
 Called Catherine Lowery and her sister Sallye Ober.  Notified them of the appointment with Dr. Bertis Ruddy on 05/17/23 at 2:00.  They verbalized agreement and understanding of the appointment.

## 2023-05-14 ENCOUNTER — Encounter: Payer: Self-pay | Admitting: Hematology and Oncology

## 2023-05-15 LAB — SURGICAL PATHOLOGY

## 2023-05-16 ENCOUNTER — Encounter: Payer: Self-pay | Admitting: Pulmonary Disease

## 2023-05-16 ENCOUNTER — Ambulatory Visit: Payer: Medicare HMO | Admitting: Pulmonary Disease

## 2023-05-16 VITALS — BP 124/62 | HR 60 | Ht 69.0 in | Wt 130.4 lb

## 2023-05-16 DIAGNOSIS — J432 Centrilobular emphysema: Secondary | ICD-10-CM

## 2023-05-16 DIAGNOSIS — Z227 Latent tuberculosis: Secondary | ICD-10-CM | POA: Diagnosis not present

## 2023-05-16 DIAGNOSIS — R911 Solitary pulmonary nodule: Secondary | ICD-10-CM | POA: Diagnosis not present
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
## 2023-05-16 NOTE — Patient Instructions (Signed)
 Thank you for visiting Dr. Thelda Finney at [facility]. Today we recommend the following:  Return in about 6 months (around 11/13/2023) for with Dara Ear, NP, or Dr. Thelda Finney.    Please do your part to reduce the spread of COVID-19.

## 2023-05-17 ENCOUNTER — Telehealth: Payer: Self-pay

## 2023-05-17 ENCOUNTER — Encounter: Payer: Self-pay | Admitting: Hematology and Oncology

## 2023-05-17 ENCOUNTER — Inpatient Hospital Stay: Payer: Medicare HMO | Attending: Hematology and Oncology | Admitting: Hematology and Oncology

## 2023-05-17 VITALS — BP 110/60 | HR 55 | Temp 97.8°F | Resp 18 | Ht 69.0 in | Wt 132.8 lb

## 2023-05-17 DIAGNOSIS — C562 Malignant neoplasm of left ovary: Secondary | ICD-10-CM

## 2023-05-17 DIAGNOSIS — Z5111 Encounter for antineoplastic chemotherapy: Secondary | ICD-10-CM | POA: Insufficient documentation

## 2023-05-17 DIAGNOSIS — Z170 Estrogen receptor positive status [ER+]: Secondary | ICD-10-CM | POA: Insufficient documentation

## 2023-05-17 DIAGNOSIS — Z923 Personal history of irradiation: Secondary | ICD-10-CM | POA: Insufficient documentation

## 2023-05-17 DIAGNOSIS — R630 Anorexia: Secondary | ICD-10-CM | POA: Insufficient documentation

## 2023-05-17 DIAGNOSIS — C50311 Malignant neoplasm of lower-inner quadrant of right female breast: Secondary | ICD-10-CM | POA: Insufficient documentation

## 2023-05-17 DIAGNOSIS — Z1721 Progesterone receptor positive status: Secondary | ICD-10-CM | POA: Diagnosis not present

## 2023-05-17 DIAGNOSIS — C786 Secondary malignant neoplasm of retroperitoneum and peritoneum: Secondary | ICD-10-CM | POA: Insufficient documentation

## 2023-05-17 DIAGNOSIS — Z1732 Human epidermal growth factor receptor 2 negative status: Secondary | ICD-10-CM | POA: Diagnosis not present

## 2023-05-17 DIAGNOSIS — F17210 Nicotine dependence, cigarettes, uncomplicated: Secondary | ICD-10-CM | POA: Diagnosis not present

## 2023-05-17 MED ORDER — LIDOCAINE-PRILOCAINE 2.5-2.5 % EX CREA: TOPICAL_CREAM | CUTANEOUS | 3 refills | Status: AC

## 2023-05-17 MED ORDER — ONDANSETRON HCL 8 MG PO TABS: 8.0000 mg | ORAL_TABLET | Freq: Three times a day (TID) | ORAL | 1 refills | Status: DC | PRN

## 2023-05-17 MED ORDER — DEXAMETHASONE 4 MG PO TABS
ORAL_TABLET | ORAL | 6 refills | Status: DC
Start: 2023-05-17 — End: 2023-09-26

## 2023-05-17 MED ORDER — PROCHLORPERAZINE MALEATE 10 MG PO TABS: 10.0000 mg | ORAL_TABLET | Freq: Four times a day (QID) | ORAL | 1 refills | Status: AC | PRN

## 2023-05-17 NOTE — Telephone Encounter (Signed)
FMLA paperwork dropped off to be completed on behalf of patient's sister, Catherine Lowery.

## 2023-05-17 NOTE — Research (Signed)
Trial:  [id number]: COGNITIVE TRAINING FOR CANCER RELATED COGNITIVE IMPAIRMENT IN BREAST CANCER SURVIVORS: A MULTI-CENTER RANDOMIZED DOUBLE- BLINDED CONTROLLED TRIAL  Patient Catherine Lowery was identified by Dr Bertis Ruddy as a potential candidate for the above listed study.  This Clinical Research Nurse met with Catherine Lowery, [id number], on 05/17/23 in a manner and location that ensures patient privacy to discuss participation in the above listed research study.  Patient is Accompanied by her 2 sisters .  A copy of the informed consent document and separate HIPAA Authorization was provided to the patient.  Patient reads, speaks, and understands [place].   Patient was provided with the business card of this Nurse and encouraged to contact the research team with any questions.  Approximately 20 minutes were spent with the patient reviewing the informed consent documents.  Patient was provided the option of taking informed consent documents home to review and was encouraged to review at their convenience with their support network, including other care providers. Patient took the consent documents home to review. Plan to follow up with patient at her chemo ed class next week. Assisted patient with obtaining her schedule from [facility] in scheduling to make sure patient has her appts lined up. Margret Chance Braylea Brancato, RN, BSN, [facility]  Her  Hers Clinical Research Nurse [facility] Direct Dial [phone] 05/17/2023 3:09 PM

## 2023-05-17 NOTE — Progress Notes (Signed)
START ON PATHWAY REGIMEN - Ovarian     A cycle is every 21 days:     Paclitaxel      Carboplatin   **Always confirm dose/schedule in your pharmacy ordering system**  Patient Characteristics: Preoperative or Nonsurgical Candidate (Clinical Staging), Newly Diagnosed, Neoadjuvant Therapy followed by Surgery BRCA Mutation Status: Awaiting Test Results Therapeutic Status: Preoperative or Nonsurgical Candidate (Clinical Staging) AJCC T Category: cT3 AJCC 8 Stage Grouping: Unknown AJCC N Category: cN1 AJCC M Category: cM0 Therapy Plan: Neoadjuvant Therapy followed by Surgery Intent of Therapy: Curative Intent, Discussed with Patient

## 2023-05-17 NOTE — Progress Notes (Signed)
[facility] FOLLOW-UP progress notes  Patient Care Team: [name], NP as PCP - General (Nurse Practitioner) [name], MD as PCP - Cardiology (Cardiology) [name], MD as Consulting Physician (Radiation Oncology) [name], MD as Attending Physician (Orthopedic Surgery) [name], MD as Consulting Physician (Neurology) [name], [facility] (Inactive) as Pharmacist (Pharmacist)  CHIEF COMPLAINTS/PURPOSE OF VISIT:  Newly diagnosed ovarian cancer, for management  HISTORY OF PRESENTING ILLNESS:  Catherine Lowery 78 y.o. female was transferred to my care after recent diagnosis of ovarian cancer The patient has remote history of breast cancer and had received treatment with antiestrogen therapy up until recently She was seen in the hospital on May 09, 2023.  Please see details from that visit She has not been feeling well for several weeks with abdominal pain and bloating, along with severe constipation.  This is new over the past month She has anorexia and has lost almost 10 pounds She denies vaginal bleeding She has not had bowel movement for 8 days She denies nausea vomiting Since admission to the hospital, she underwent CT angiogram of the chest as well as CT scan of the abdomen and pelvis which revealed significant abnormalities worrisome for undiagnosed ovarian cancer She had biopsy and port placement prior to discharge from the hospital and returns today to discuss plan of care She is doing well since discharge.  She is able to eat regular diet and with regular laxative, she have normal bowel movement  I reviewed the patient's records extensive and collaborated the history with the patient. Summary of her history is as follows: Oncology History  Malignant neoplasm of lower-inner quadrant of right breast of female, estrogen receptor positive (HCC)  03/18/2018 Initial Diagnosis   Screening mammography on 02/27/2019 revealed a right  breast abnormality, Right breast lower inner quadrant biopsy for a clinical T2N0, [name], grade 2 or 3, estrogen and progesterone receptor positive, [name], with an MIB-1 of 5%   05/07/2018 Cancer Staging   Staging form: Breast, AJCC 8th Edition - Pathologic stage from 05/07/2018: Stage IB (pT2, pN16mi, cM0, G2, ER+, PR+, HER2-) - Signed by Loa Socks, NP on 04/07/2019   05/07/2018 Surgery   pT2 pN1, [name], grade 2, with a positive inferior margin.   05/07/2018 Oncotype testing   MammaPrint obtained from the definitive surgical sample read as "low risk" predicting a disease-free survival at 5 years of 97.8% without need of chemotherapy.   05/18/2018 Genetic Testing   The Common Hereditary Cancers Panel + Thyroid Caner panel was ordered (55 genes): APC, [name], [name], [name], [name], [name], [name], [name], [name], [name], [name], [name], [name], [name], [name], [name], [name], [name], [name], [name], [name], KIT, MEN1, [name], [name], [name], [name], [name], [name], [name], [name], [name], [name], [name], [name], [name], POLE, [name], [name], RAD50, [name], [name], [name], [name], [name], [name], [name], [name], [name], [name], [name], [name], [name], [name], [name], VHL.  Results: No pathogenic variants identified.  2 variants of uncertain significance in the genes [name] c.1176C>T (Silent) and [name] c.1419+4C>T (Intronic) were identified.  The date of this test report is 05/18/2018.   UPDATE: The [name] c.1419+4C>T (Intronic) VUS has been reclassified to "Likely Benign." The report date is 08/08/2018. UPDATE: The [name] c.1176C>T (Silent) VUS has been reclassified to "Likely Benign." The report date is 09/20/2022.    06/05/2018 Surgery   ([id number]) Reexcision of the right breast; all margins negative.   07/11/2018 - 08/27/2018  Radiation Therapy   The patient initially received a dose of 50.4 Gy in 28 fractions to the breast using whole-breast tangent  fields with an additional 45 Gy in 25 fractions to the right axilla. This was delivered using a 3-D conformal technique. The pt received a boost delivering an additional 12 Gy in 6 fractions using a electron boost with electrons. The total dose was 107.4 Gy.    09/2018 - 09/2023 Anti-estrogen oral therapy   anastrozole   Left ovarian epithelial cancer (HCC)  05/08/2023 Initial Diagnosis   Left ovarian epithelial cancer (HCC)   05/08/2023 Imaging   IR IMAGING GUIDED PORT INSERTION Result Date: 05/10/2023 INDICATION: Concern for metastatic ovarian cancer. Please perform image guided biopsy of peritoneal mass for tissue diagnostic purposes. Please perform image guided Port a catheter placement for durable intravenous access for impending chemotherapy. EXAM: 1. IMPLANTED PORT A CATH PLACEMENT WITH ULTRASOUND AND FLUOROSCOPIC GUIDANCE 2. ULTRASOUND-GUIDED LEFT VENTRAL LATERAL PERITONEAL ABDOMINAL MASS BIOPSY COMPARISON:  Chest CT-05/09/2023 CT abdomen and pelvis-05/08/2023 MEDICATIONS: None ANESTHESIA/SEDATION: Moderate (conscious) sedation was employed during this procedure as administered by the Interventional Radiology RN. A total of Versed 3 mg and Fentanyl 125 mcg was administered intravenously. Moderate Sedation Time: 46 minutes. The patient's level of consciousness and vital signs were monitored continuously by radiology nursing throughout the procedure under my direct supervision. CONTRAST:  None FLUOROSCOPY TIME:  42 seconds (4 mGy) COMPLICATIONS: None immediate. PROCEDURE: The procedure, risks, benefits, and alternatives were explained to the patient. Questions regarding the procedure were encouraged and answered. The patient understands and consents to the procedure. The right neck and chest were prepped with chlorhexidine in a sterile fashion, and a sterile drape was applied covering the operative field. Maximum barrier sterile technique with sterile gowns and gloves were used for the procedure. A  timeout was performed prior to the initiation of the procedure. Local anesthesia was provided with 1% lidocaine with epinephrine. After creating a small venotomy incision, a micropuncture kit was utilized to access the internal jugular vein. Real-time ultrasound guidance was utilized for vascular access including the acquisition of a permanent ultrasound image documenting patency of the accessed vessel. The microwire was utilized to measure appropriate catheter length. A subcutaneous port pocket was then created along the upper chest wall utilizing a combination of sharp and blunt dissection. The pocket was irrigated with sterile saline. A single lumen Slim sized power injectable port was chosen for placement. The 8 Fr catheter was tunneled from the port pocket site to the venotomy incision. The port was placed in the pocket. The external catheter was trimmed to appropriate length. At the venotomy, an 8 Fr peel-away sheath was placed over a guidewire under fluoroscopic guidance. The catheter was then placed through the sheath and the sheath was removed. Final catheter positioning was confirmed and documented with a fluoroscopic spot radiograph. The port was accessed with a Huber needle, aspirated and flushed with heparinized saline. The venotomy site was closed with an interrupted 4-0 Vicryl suture. The port pocket incision was closed with interrupted 2-0 Vicryl suture. Dermabond and Steri-strips were applied to both incisions. Dressings were applied. Attention was now paid towards image guided biopsy of dominant peritoneal mass. Sonographic evaluation performed of the ventral aspect of the left mid abdomen demonstrating an at least 8.3 x 2.1 cm hypoechoic macrolobulated mass (image 1), correlating with the mass seen on preceding abdominal CT image 50, series 3. The procedure was planned. The skin overlying the operative site was prepped and  draped in usual sterile fashion. After the overlying soft tissues were  anesthetized with 1% lidocaine with epinephrine, a 17 gauge coaxial needle was advanced into the peripheral aspect of the mass. Next, under direct ultrasound guidance, 6 core needle biopsies were obtained yielding the return of adequate tissue. Samples were submitted to the laboratory for pathologic analysis. The coaxial needle was removed following the administration of a Gel-Foam slurry and superficial hemostasis achieved with manual compression. Postprocedural imaging was negative for complication, specifically, no significant Peri biopsy hematoma. A dressing was applied. The patient tolerated the above procedures well without immediate post procedural complication. FINDINGS: After catheter placement, the tip lies within the superior cavoatrial junction. The catheter aspirates and flushes normally and is ready for immediate use. Sonographic evaluation of the left mid abdomen demonstrates an at least 8.3 x 2.1 cm hypoechoic macrolobulated mass, correlating with the mass seen on preceding abdominal CT. IMPRESSION: 1. Successful placement of a right internal jugular approach power injectable Port-A-Cath. The catheter is ready for immediate use. 2. Successful ultrasound-guided biopsy of peritoneal mass within the ventral aspect of the left mid/lateral abdomen. Electronically Signed   By: Simonne Come M.D.   On: 05/10/2023 14:59   IR US Guide Bx Asp/Drain Result Date: 05/10/2023 INDICATION: Concern for metastatic ovarian cancer. Please perform image guided biopsy of peritoneal mass for tissue diagnostic purposes. Please perform image guided Port a catheter placement for durable intravenous access for impending chemotherapy. EXAM: 1. IMPLANTED PORT A CATH PLACEMENT WITH ULTRASOUND AND FLUOROSCOPIC GUIDANCE 2. ULTRASOUND-GUIDED LEFT VENTRAL LATERAL PERITONEAL ABDOMINAL MASS BIOPSY COMPARISON:  Chest CT-05/09/2023 CT abdomen and pelvis-05/08/2023 MEDICATIONS: None ANESTHESIA/SEDATION: Moderate (conscious) sedation was  employed during this procedure as administered by the Interventional Radiology RN. A total of Versed 3 mg and Fentanyl 125 mcg was administered intravenously. Moderate Sedation Time: 46 minutes. The patient's level of consciousness and vital signs were monitored continuously by radiology nursing throughout the procedure under my direct supervision. CONTRAST:  None FLUOROSCOPY TIME:  42 seconds (4 mGy) COMPLICATIONS: None immediate. PROCEDURE: The procedure, risks, benefits, and alternatives were explained to the patient. Questions regarding the procedure were encouraged and answered. The patient understands and consents to the procedure. The right neck and chest were prepped with chlorhexidine in a sterile fashion, and a sterile drape was applied covering the operative field. Maximum barrier sterile technique with sterile gowns and gloves were used for the procedure. A timeout was performed prior to the initiation of the procedure. Local anesthesia was provided with 1% lidocaine with epinephrine. After creating a small venotomy incision, a micropuncture kit was utilized to access the internal jugular vein. Real-time ultrasound guidance was utilized for vascular access including the acquisition of a permanent ultrasound image documenting patency of the accessed vessel. The microwire was utilized to measure appropriate catheter length. A subcutaneous port pocket was then created along the upper chest wall utilizing a combination of sharp and blunt dissection. The pocket was irrigated with sterile saline. A single lumen Slim sized power injectable port was chosen for placement. The 8 Fr catheter was tunneled from the port pocket site to the venotomy incision. The port was placed in the pocket. The external catheter was trimmed to appropriate length. At the venotomy, an 8 Fr peel-away sheath was placed over a guidewire under fluoroscopic guidance. The catheter was then placed through the sheath and the sheath was  removed. Final catheter positioning was confirmed and documented with a fluoroscopic spot radiograph. The port was accessed with a Demetrios Isaacs  needle, aspirated and flushed with heparinized saline. The venotomy site was closed with an interrupted 4-0 Vicryl suture. The port pocket incision was closed with interrupted 2-0 Vicryl suture. Dermabond and Steri-strips were applied to both incisions. Dressings were applied. Attention was now paid towards image guided biopsy of dominant peritoneal mass. Sonographic evaluation performed of the ventral aspect of the left mid abdomen demonstrating an at least 8.3 x 2.1 cm hypoechoic macrolobulated mass (image 1), correlating with the mass seen on preceding abdominal CT image 50, series 3. The procedure was planned. The skin overlying the operative site was prepped and draped in usual sterile fashion. After the overlying soft tissues were anesthetized with 1% lidocaine with epinephrine, a 17 gauge coaxial needle was advanced into the peripheral aspect of the mass. Next, under direct ultrasound guidance, 6 core needle biopsies were obtained yielding the return of adequate tissue. Samples were submitted to the laboratory for pathologic analysis. The coaxial needle was removed following the administration of a Gel-Foam slurry and superficial hemostasis achieved with manual compression. Postprocedural imaging was negative for complication, specifically, no significant Peri biopsy hematoma. A dressing was applied. The patient tolerated the above procedures well without immediate post procedural complication. FINDINGS: After catheter placement, the tip lies within the superior cavoatrial junction. The catheter aspirates and flushes normally and is ready for immediate use. Sonographic evaluation of the left mid abdomen demonstrates an at least 8.3 x 2.1 cm hypoechoic macrolobulated mass, correlating with the mass seen on preceding abdominal CT. IMPRESSION: 1. Successful placement of a right  internal jugular approach power injectable Port-A-Cath. The catheter is ready for immediate use. 2. Successful ultrasound-guided biopsy of peritoneal mass within the ventral aspect of the left mid/lateral abdomen. Electronically Signed   By: Simonne Come M.D.   On: 05/10/2023 14:59   CT Angio Chest Aorta W and/or Wo Contrast Result Date: 05/09/2023 CLINICAL DATA:  Constipation and abdominal pain. Acute aortic syndrome suspected. EXAM: CT ANGIOGRAPHY CHEST WITH CONTRAST TECHNIQUE: Multidetector CT imaging of the chest was performed using the standard protocol during bolus administration of intravenous contrast. Multiplanar CT image reconstructions and MIPs were obtained to evaluate the vascular anatomy. RADIATION DOSE REDUCTION: This exam was performed according to the departmental dose-optimization program which includes automated exposure control, adjustment of the mA and/or kV according to patient size and/or use of iterative reconstruction technique. CONTRAST:  60mL OMNIPAQUE IOHEXOL 350 MG/ML SOLN COMPARISON:  Radiograph 04/27/2023 and CT chest 05/15/2022 FINDINGS: Cardiovascular: Normal heart size. No pericardial effusion. Aortic atherosclerotic calcification. No aortic aneurysm, dissection, or penetrating atherosclerotic ulcer. Coronary artery calcification. Mediastinum/Nodes: Trachea and esophagus are unremarkable. No thoracic adenopathy. Lungs/Pleura: Centrilobular emphysema greatest in the apices. Bronchial wall thickening and mucous plugging in the lower lobes. Right lower lobe atelectasis. Clustered centrilobular micro nodules and tree-in-bud opacities in the right upper lobe. No pleural effusion or pneumothorax. Upper Abdomen: No acute abnormality. Musculoskeletal: No acute fracture.  Thoracic spondylosis. Review of the MIP images confirms the above findings. IMPRESSION: 1. No aortic aneurysm or dissection. 2. Emphysema and chronic bronchitis. Aortic Atherosclerosis (ICD10-I70.0) and Emphysema  (ICD10-J43.9). Electronically Signed   By: Minerva Fester M.D.   On: 05/09/2023 00:15   CT ABDOMEN PELVIS W CONTRAST Result Date: 05/08/2023 CLINICAL DATA:  Left lower quadrant pain EXAM: CT ABDOMEN AND PELVIS WITH CONTRAST TECHNIQUE: Multidetector CT imaging of the abdomen and pelvis was performed using the standard protocol following bolus administration of intravenous contrast. RADIATION DOSE REDUCTION: This exam was performed according to the departmental  dose-optimization program which includes automated exposure control, adjustment of the mA and/or kV according to patient size and/or use of iterative reconstruction technique. CONTRAST:  50mL OMNIPAQUE IOHEXOL 350 MG/ML SOLN COMPARISON:  CT 10/30/2015 FINDINGS: Lower chest: Lung bases demonstrate bandlike scarring within the lower lobes and right middle lobe. No acute airspace disease. Hepatobiliary: Subcentimeter hypodensities in the liver too small to further characterize. No calcified gallstone or biliary dilatation. Pancreas: Unremarkable. No pancreatic ductal dilatation or surrounding inflammatory changes. Spleen: Normal in size without focal abnormality. Adrenals/Urinary Tract: Adrenal glands show no focal abnormality. Kidneys show no hydronephrosis. Simple bilateral renal cysts for which no imaging follow-up is recommended. 2.1 cm superior pole exophytic complex cyst. Bladder partially obscured Stomach/Bowel: Stomach nonenlarged. No dilated small bowel. No acute bowel wall thickening. Diverticular disease of the left colon. Vascular/Lymphatic: Advanced aortic atherosclerosis. No aneurysm. Mildly enlarged left Peri aortic node measuring 19 mm on series 3, image 42. Reproductive: Uterus is unremarkable. Heterogenous left adnexal mass lesion measuring 6.8 x 5.8 by 7 cm concerning for neoplasm. Other: Small volume free fluid within the pelvis. Evidence for peritoneal metastatic disease. 10 mm left upper quadrant peritoneal nodule on series 3, image 35.  Right mid quadrant soft tissue mass measuring 5.7 x 2.4 cm on series 3, image 44. Left lower quadrant soft tissue mass measuring 8.1 by 2.5 by 12.1 cm. Soft tissue nodule in the right colic gutter measuring 1.7 cm on series 3, image 55. Right posterior pelvic soft tissue mass measuring 3.6 x 2.6 cm on series 3, image 76. Additional small mesenteric nodules are noted. Musculoskeletal: No acute or suspicious osseous abnormality. Left hip replacement with artifact. IMPRESSION: 1. Large heterogeneous cystic and solid left adnexal mass highly suspicious for ovarian neoplasm. 2. Positive for peritoneal metastatic disease as evidence by multiple intra-abdominal and pelvic tumor implants. Trace volume ascites in the pelvis. Several mildly enlarged retroperitoneal lymph nodes also suspicious for metastatic disease. 3. Diverticular disease of the left colon without acute inflammatory process. 4. Aortic atherosclerosis. Aortic Atherosclerosis (ICD10-I70.0). Electronically Signed   By: Jasmine Pang M.D.   On: 05/08/2023 19:53   DG Chest 2 View Result Date: 04/27/2023 CLINICAL DATA:  Cough.  Chills and congestion. EXAM: CHEST - 2 VIEW COMPARISON:  03/15/2020. FINDINGS: Redemonstration of irregular opacity overlying the right lateral costophrenic angle, which corresponds to area of scarring/atelectasis on the prior CT scan from 05/15/2022. Bilateral lungs are otherwise clear. No acute consolidation or lung collapse. Bilateral costophrenic angles are clear. Normal cardio-mediastinal silhouette. No acute osseous abnormalities. The soft tissues are within normal limits. IMPRESSION: No active cardiopulmonary disease. Electronically Signed   By: Jules Schick M.D.   On: 04/27/2023 15:46      05/09/2023 Tumor Marker   Patient's tumor was tested for the following markers: CA-125. Results of the tumor marker test revealed 137.   05/10/2023 Pathology Results   SURGICAL PATHOLOGY  CASE: [id number]  PATIENT: Laniece Flow   Surgical Pathology Report   Clinical History: concern for metastatic ovarian cancer (cm)   FINAL MICROSCOPIC DIAGNOSIS:   A. OMENTUM, MASS WITHIN LEFT MID ABDOMEN, NEEDLE CORE BIOPSY:  - Metastatic carcinoma with a gynecologic primary, see comment  Immunohistochemical stain show that the tumor cells are positive for CK7 and PAX8 while they are negative for CK20 and GATA3 with above interpretation.  Dr. Kenyon Ana reviewed the case and concurs with the above diagnosis.    05/14/2023 Cancer Staging   Staging form: Ovary, Fallopian Tube, and Primary Peritoneal Carcinoma, AJCC  8th Edition - Clinical stage from 05/14/2023: FIGO Stage IIIC (cT3c, cN1, cM0) - Signed by [name], MD on 05/14/2023 Stage prefix: Initial diagnosis   05/25/2023 -  Chemotherapy   Patient is on Treatment Plan : OVARIAN Carboplatin (AUC 6) + Paclitaxel (175) q21d X 6 Cycles       MEDICAL HISTORY:  Past Medical History:  Diagnosis Date   Cancer (HCC) 03/2018   right breast cancer   Chronic kidney disease    CKD   DJD (degenerative joint disease)    LEFT HIP   Family history of breast cancer    Family history of ovarian cancer    Family history of prostate cancer    Family history of prostate cancer    H/O migraine 07/01/2019   Hyperlipidemia    takes Simvasatin daily   Hypertension    takes Propranolol and HCTZ daioly   Hyperthyroidism    Pneumonia 10/2015   PONV (postoperative nausea and vomiting)    when ether used   Reactive airway disease 05/08/2023   Shortness of breath dyspnea    with ambulation   Tremors of nervous system    takes Primidone daily    SURGICAL HISTORY: Past Surgical History:  Procedure Laterality Date   BOne Spur     left heel   BREAST LUMPECTOMY WITH RADIOACTIVE SEED AND SENTINEL LYMPH NODE BIOPSY Right 05/07/2018   Procedure: RIGHT BREAST LUMPECTOMY WITH RADIOACTIVE SEED AND RIGHT  SENTINEL LYMPH NODE MAPPING;  Surgeon: Harriette Bouillon, MD;  Location: [facility];   Service: General;  Laterality: Right;   COLONOSCOPY     IR IMAGING GUIDED PORT INSERTION  05/10/2023   IR US GUIDE BX ASP/DRAIN  05/10/2023   JOINT REPLACEMENT Bilateral    knee   KNEE SURGERY     RE-EXCISION OF BREAST LUMPECTOMY Right 06/05/2018   Procedure: RE-EXCISION OF RIGHT BREAST LUMPECTOMY;  Surgeon: Harriette Bouillon, MD;  Location: [facility];  Service: General;  Laterality: Right;   ROTATOR CUFF REPAIR Bilateral    TOTAL HIP ARTHROPLASTY Left 12/29/2015   TOTAL HIP ARTHROPLASTY Left 12/29/2015   Procedure: TOTAL HIP ARTHROPLASTY ANTERIOR APPROACH;  Surgeon: Loreta Ave, MD;  Location: [facility] OR;  Service: Orthopedics;  Laterality: Left;    SOCIAL HISTORY: Social History   Socioeconomic History   Marital status: Single    Spouse name: Not on file   Number of children: 1   Years of education: 14   Highest education level: Not on file  Occupational History    Comment: retired  Tobacco Use   Smoking status: Every Day    Current packs/day: 0.75    Average packs/day: 0.8 packs/day for 28.0 years (21.0 ttl pk-yrs)    Types: Cigarettes   Smokeless tobacco: Never   Tobacco comments:    02/13/22 Pt stated "I'm trying to quit" Glynda Jaeger, CMA   Vaping Use   Vaping status: Never Used  Substance and Sexual Activity   Alcohol use: No    Alcohol/week: 0.0 standard drinks of alcohol   Drug use: No   Sexual activity: Not Currently    Birth control/protection: Post-menopausal  Other Topics Concern   Not on file  Social History Narrative   Lives with handicapped son   Caffeine- coffee, 2 cups daily, 1-2 sodas daily   Social Drivers of Health   Financial Resource Strain: Low Risk  (01/28/2020)   Overall Financial Resource Strain (CARDIA)    Difficulty of Paying Living  Expenses: Not very hard  Food Insecurity: No Food Insecurity (05/09/2023)   Hunger Vital Sign    Worried About Running Out of Food in the Last Year: Never true    Ran Out of Food in the Last  Year: Never true  Transportation Needs: No Transportation Needs (05/09/2023)   PRAPARE - Administrator, Civil Service (Medical): No    Lack of Transportation (Non-Medical): No  Physical Activity: Not on file  Stress: Not on file  Social Connections: Moderately Integrated (05/09/2023)   Social Connection and Isolation Panel [NHANES]    Frequency of Communication with Friends and Family: More than three times a week    Frequency of Social Gatherings with Friends and Family: Three times a week    Attends Religious Services: More than 4 times per year    Active Member of Clubs or Organizations: Yes    Attends Banker Meetings: Never    Marital Status: Divorced  Catering manager Violence: At Risk (05/09/2023)   Humiliation, Afraid, Rape, and Kick questionnaire    Fear of Current or Ex-Partner: Yes    Emotionally Abused: No    Physically Abused: No    Sexually Abused: No    FAMILY HISTORY: Family History  Problem Relation Age of Onset   Hypertension Mother    Heart disease Mother    Stroke Mother    Multiple myeloma Father 31   Thyroid cancer Sister        dx 40's/50's   Cancer Paternal Aunt        unknown   Prostate cancer Paternal Uncle        dx >50- cancer was the cause of his death   Ovarian cancer Paternal Aunt    Throat cancer Paternal Uncle    Breast cancer Cousin        dx >50   Cancer Cousin        type unk   Cancer Cousin        type unk   Cancer Cousin        type unk    ALLERGIES:  is allergic to morphine and codeine, other, and percocet [oxycodone-acetaminophen].  MEDICATIONS:  Current Outpatient Medications  Medication Sig Dispense Refill   dexamethasone (DECADRON) 4 MG tablet Take 2 tabs at the night before and 2 tab the morning of chemotherapy, every 3 weeks, by mouth x 6 cycles 24 tablet 6   albuterol (VENTOLIN HFA) 108 (90 Base) MCG/ACT inhaler Inhale 2 puffs into the lungs every 4 (four) hours as needed for wheezing or shortness  of breath.     amLODipine (NORVASC) 10 MG tablet Take 1 tablet (10 mg total) by mouth daily. 90 tablet 1   aspirin (ASPIRIN ADULT) 325 MG tablet enteric coated; may use OTC (Patient taking differently: Take 325 mg by mouth daily. enteric coated; may use OTC) 30 tablet 0   atorvastatin (LIPITOR) 40 MG tablet Take 1 tablet (40 mg total) by mouth daily. 90 tablet 1   bisacodyl (DULCOLAX) 5 MG EC tablet Take 2 tablets (10 mg total) by mouth daily as needed for moderate constipation.     Cholecalciferol (VITAMIN D3) 50 MCG (2000 UT) capsule Take 1 capsule (2,000 Units total) by mouth daily. 90 capsule 1   levothyroxine (SYNTHROID) 50 MCG tablet Take 1 tablet (50 mcg total) by mouth daily. 90 tablet 3   lidocaine-prilocaine (EMLA) cream Apply to affected area once 30 g 3   losartan (COZAAR) 25 MG  tablet Take 25 mg by mouth daily.     Omega-3 Fatty Acids (FISH OIL PO) Take 1,000 mg by mouth daily.     ondansetron (ZOFRAN) 8 MG tablet Take 1 tablet (8 mg total) by mouth every 8 (eight) hours as needed for nausea or vomiting. Start on the third day after carboplatin. 30 tablet 1   polyethylene glycol (MIRALAX / GLYCOLAX) 17 g packet Take 34 g by mouth daily.     primidone (MYSOLINE) 50 MG tablet Take 2 tablets (100 mg total) by mouth every morning. 180 tablet 2   prochlorperazine (COMPAZINE) 10 MG tablet Take 1 tablet (10 mg total) by mouth every 6 (six) hours as needed for nausea or vomiting. 30 tablet 1   propranolol (INDERAL) 40 MG tablet Take 0.5 tablets (20 mg total) by mouth 2 (two) times daily. (Patient taking differently: Take 20 mg by mouth daily.) 90 tablet 1   senna-docusate (SENOKOT-S) 8.6-50 MG tablet Take 1 tablet by mouth 2 (two) times daily.     TRELEGY ELLIPTA 100-62.5-25 MCG/ACT AEPB Inhale 1 puff into the lungs daily.     VOLTAREN 1 % GEL APPLY TOPICALLY 4 TIMES DAILY AS NEEDED 100 g 0   No current facility-administered medications for this visit.    REVIEW OF SYSTEMS:    Constitutional: Denies fevers, chills or abnormal night sweats Eyes: Denies blurriness of vision, double vision or watery eyes Ears, nose, mouth, throat, and face: Denies mucositis or sore throat Respiratory: Denies cough, dyspnea or wheezes Cardiovascular: Denies palpitation, chest discomfort or lower extremity swelling Gastrointestinal:  Denies nausea, heartburn or change in bowel habits Skin: Denies abnormal skin rashes Lymphatics: Denies new lymphadenopathy or easy bruising Neurological:Denies numbness, tingling or new weaknesses Behavioral/Psych: Mood is stable, no new changes  All other systems were reviewed with the patient and are negative.  PHYSICAL EXAMINATION: ECOG PERFORMANCE STATUS: 1 - Symptomatic but completely ambulatory  Vitals:   05/17/23 1407  BP: 110/60  Pulse: (!) 55  Resp: 18  Temp: 97.8 F (36.6 C)  SpO2: 100%   Filed Weights   05/17/23 1407  Weight: 132 lb 12.8 oz (60.2 kg)    GENERAL:alert, no distress and comfortable SKIN: skin color, texture, turgor are normal, no rashes or significant lesions EYES: normal, conjunctiva are pink and non-injected, sclera clear OROPHARYNX:no exudate, normal lips, buccal mucosa, and tongue  NECK: supple, thyroid normal size, non-tender, without nodularity LYMPH:  no palpable lymphadenopathy in the cervical, axillary or inguinal LUNGS: clear to auscultation and percussion with normal breathing effort HEART: regular rate & rhythm and no murmurs without lower extremity edema ABDOMEN:abdomen soft, non-tender and normal bowel sounds Musculoskeletal:no cyanosis of digits and no clubbing  PSYCH: alert & oriented x 3 with fluent speech NEURO: no focal motor/sensory deficits  LABORATORY DATA:  I have reviewed the data as listed Lab Results  Component Value Date   WBC 6.3 05/09/2023   HGB 12.1 05/09/2023   HCT 36.0 05/09/2023   MCV 68.7 (L) 05/09/2023   PLT 340 05/09/2023   Recent Labs    12/29/22 0851  05/08/23 1317 05/09/23 0702  NA 139 138 137  K 4.2 4.4 4.5  CL 106 104 101  CO2 28 23 23   GLUCOSE 96 113* 100*  BUN 17 19 26*  CREATININE 0.87 0.98 0.97  CALCIUM 9.2 9.7 9.5  GFRNONAA >60 59* 59*  PROT 7.3 7.9 7.5  ALBUMIN 3.9 3.6 3.5  AST 27 28 25   ALT 22 12 11  ALKPHOS 61 60 57  BILITOT 0.4 0.8 1.0    RADIOGRAPHIC STUDIES: I have personally reviewed the radiological images as listed and agreed with the findings in the report. IR IMAGING GUIDED PORT INSERTION Result Date: 05/10/2023 INDICATION: Concern for metastatic ovarian cancer. Please perform image guided biopsy of peritoneal mass for tissue diagnostic purposes. Please perform image guided Port a catheter placement for durable intravenous access for impending chemotherapy. EXAM: 1. IMPLANTED PORT A CATH PLACEMENT WITH ULTRASOUND AND FLUOROSCOPIC GUIDANCE 2. ULTRASOUND-GUIDED LEFT VENTRAL LATERAL PERITONEAL ABDOMINAL MASS BIOPSY COMPARISON:  Chest CT-05/09/2023 CT abdomen and pelvis-05/08/2023 MEDICATIONS: None ANESTHESIA/SEDATION: Moderate (conscious) sedation was employed during this procedure as administered by the Interventional Radiology RN. A total of Versed 3 mg and Fentanyl 125 mcg was administered intravenously. Moderate Sedation Time: 46 minutes. The patient's level of consciousness and vital signs were monitored continuously by radiology nursing throughout the procedure under my direct supervision. CONTRAST:  None FLUOROSCOPY TIME:  42 seconds (4 mGy) COMPLICATIONS: None immediate. PROCEDURE: The procedure, risks, benefits, and alternatives were explained to the patient. Questions regarding the procedure were encouraged and answered. The patient understands and consents to the procedure. The right neck and chest were prepped with chlorhexidine in a sterile fashion, and a sterile drape was applied covering the operative field. Maximum barrier sterile technique with sterile gowns and gloves were used for the procedure. A timeout  was performed prior to the initiation of the procedure. Local anesthesia was provided with 1% lidocaine with epinephrine. After creating a small venotomy incision, a micropuncture kit was utilized to access the internal jugular vein. Real-time ultrasound guidance was utilized for vascular access including the acquisition of a permanent ultrasound image documenting patency of the accessed vessel. The microwire was utilized to measure appropriate catheter length. A subcutaneous port pocket was then created along the upper chest wall utilizing a combination of sharp and blunt dissection. The pocket was irrigated with sterile saline. A single lumen Slim sized power injectable port was chosen for placement. The 8 Fr catheter was tunneled from the port pocket site to the venotomy incision. The port was placed in the pocket. The external catheter was trimmed to appropriate length. At the venotomy, an 8 Fr peel-away sheath was placed over a guidewire under fluoroscopic guidance. The catheter was then placed through the sheath and the sheath was removed. Final catheter positioning was confirmed and documented with a fluoroscopic spot radiograph. The port was accessed with a Huber needle, aspirated and flushed with heparinized saline. The venotomy site was closed with an interrupted 4-0 Vicryl suture. The port pocket incision was closed with interrupted 2-0 Vicryl suture. Dermabond and Steri-strips were applied to both incisions. Dressings were applied. Attention was now paid towards image guided biopsy of dominant peritoneal mass. Sonographic evaluation performed of the ventral aspect of the left mid abdomen demonstrating an at least 8.3 x 2.1 cm hypoechoic macrolobulated mass (image 1), correlating with the mass seen on preceding abdominal CT image 50, series 3. The procedure was planned. The skin overlying the operative site was prepped and draped in usual sterile fashion. After the overlying soft tissues were anesthetized  with 1% lidocaine with epinephrine, a 17 gauge coaxial needle was advanced into the peripheral aspect of the mass. Next, under direct ultrasound guidance, 6 core needle biopsies were obtained yielding the return of adequate tissue. Samples were submitted to the laboratory for pathologic analysis. The coaxial needle was removed following the administration of a Gel-Foam slurry and superficial hemostasis achieved with  manual compression. Postprocedural imaging was negative for complication, specifically, no significant Peri biopsy hematoma. A dressing was applied. The patient tolerated the above procedures well without immediate post procedural complication. FINDINGS: After catheter placement, the tip lies within the superior cavoatrial junction. The catheter aspirates and flushes normally and is ready for immediate use. Sonographic evaluation of the left mid abdomen demonstrates an at least 8.3 x 2.1 cm hypoechoic macrolobulated mass, correlating with the mass seen on preceding abdominal CT. IMPRESSION: 1. Successful placement of a right internal jugular approach power injectable Port-A-Cath. The catheter is ready for immediate use. 2. Successful ultrasound-guided biopsy of peritoneal mass within the ventral aspect of the left mid/lateral abdomen. Electronically Signed   By: Simonne Come M.D.   On: 05/10/2023 14:59   IR US Guide Bx Asp/Drain Result Date: 05/10/2023 INDICATION: Concern for metastatic ovarian cancer. Please perform image guided biopsy of peritoneal mass for tissue diagnostic purposes. Please perform image guided Port a catheter placement for durable intravenous access for impending chemotherapy. EXAM: 1. IMPLANTED PORT A CATH PLACEMENT WITH ULTRASOUND AND FLUOROSCOPIC GUIDANCE 2. ULTRASOUND-GUIDED LEFT VENTRAL LATERAL PERITONEAL ABDOMINAL MASS BIOPSY COMPARISON:  Chest CT-05/09/2023 CT abdomen and pelvis-05/08/2023 MEDICATIONS: None ANESTHESIA/SEDATION: Moderate (conscious) sedation was employed  during this procedure as administered by the Interventional Radiology RN. A total of Versed 3 mg and Fentanyl 125 mcg was administered intravenously. Moderate Sedation Time: 46 minutes. The patient's level of consciousness and vital signs were monitored continuously by radiology nursing throughout the procedure under my direct supervision. CONTRAST:  None FLUOROSCOPY TIME:  42 seconds (4 mGy) COMPLICATIONS: None immediate. PROCEDURE: The procedure, risks, benefits, and alternatives were explained to the patient. Questions regarding the procedure were encouraged and answered. The patient understands and consents to the procedure. The right neck and chest were prepped with chlorhexidine in a sterile fashion, and a sterile drape was applied covering the operative field. Maximum barrier sterile technique with sterile gowns and gloves were used for the procedure. A timeout was performed prior to the initiation of the procedure. Local anesthesia was provided with 1% lidocaine with epinephrine. After creating a small venotomy incision, a micropuncture kit was utilized to access the internal jugular vein. Real-time ultrasound guidance was utilized for vascular access including the acquisition of a permanent ultrasound image documenting patency of the accessed vessel. The microwire was utilized to measure appropriate catheter length. A subcutaneous port pocket was then created along the upper chest wall utilizing a combination of sharp and blunt dissection. The pocket was irrigated with sterile saline. A single lumen Slim sized power injectable port was chosen for placement. The 8 Fr catheter was tunneled from the port pocket site to the venotomy incision. The port was placed in the pocket. The external catheter was trimmed to appropriate length. At the venotomy, an 8 Fr peel-away sheath was placed over a guidewire under fluoroscopic guidance. The catheter was then placed through the sheath and the sheath was removed. Final  catheter positioning was confirmed and documented with a fluoroscopic spot radiograph. The port was accessed with a Huber needle, aspirated and flushed with heparinized saline. The venotomy site was closed with an interrupted 4-0 Vicryl suture. The port pocket incision was closed with interrupted 2-0 Vicryl suture. Dermabond and Steri-strips were applied to both incisions. Dressings were applied. Attention was now paid towards image guided biopsy of dominant peritoneal mass. Sonographic evaluation performed of the ventral aspect of the left mid abdomen demonstrating an at least 8.3 x 2.1 cm hypoechoic macrolobulated mass (  image 1), correlating with the mass seen on preceding abdominal CT image 50, series 3. The procedure was planned. The skin overlying the operative site was prepped and draped in usual sterile fashion. After the overlying soft tissues were anesthetized with 1% lidocaine with epinephrine, a 17 gauge coaxial needle was advanced into the peripheral aspect of the mass. Next, under direct ultrasound guidance, 6 core needle biopsies were obtained yielding the return of adequate tissue. Samples were submitted to the laboratory for pathologic analysis. The coaxial needle was removed following the administration of a Gel-Foam slurry and superficial hemostasis achieved with manual compression. Postprocedural imaging was negative for complication, specifically, no significant Peri biopsy hematoma. A dressing was applied. The patient tolerated the above procedures well without immediate post procedural complication. FINDINGS: After catheter placement, the tip lies within the superior cavoatrial junction. The catheter aspirates and flushes normally and is ready for immediate use. Sonographic evaluation of the left mid abdomen demonstrates an at least 8.3 x 2.1 cm hypoechoic macrolobulated mass, correlating with the mass seen on preceding abdominal CT. IMPRESSION: 1. Successful placement of a right internal  jugular approach power injectable Port-A-Cath. The catheter is ready for immediate use. 2. Successful ultrasound-guided biopsy of peritoneal mass within the ventral aspect of the left mid/lateral abdomen. Electronically Signed   By: Simonne Come M.D.   On: 05/10/2023 14:59   CT Angio Chest Aorta W and/or Wo Contrast Result Date: 05/09/2023 CLINICAL DATA:  Constipation and abdominal pain. Acute aortic syndrome suspected. EXAM: CT ANGIOGRAPHY CHEST WITH CONTRAST TECHNIQUE: Multidetector CT imaging of the chest was performed using the standard protocol during bolus administration of intravenous contrast. Multiplanar CT image reconstructions and MIPs were obtained to evaluate the vascular anatomy. RADIATION DOSE REDUCTION: This exam was performed according to the departmental dose-optimization program which includes automated exposure control, adjustment of the mA and/or kV according to patient size and/or use of iterative reconstruction technique. CONTRAST:  60mL OMNIPAQUE IOHEXOL 350 MG/ML SOLN COMPARISON:  Radiograph 04/27/2023 and CT chest 05/15/2022 FINDINGS: Cardiovascular: Normal heart size. No pericardial effusion. Aortic atherosclerotic calcification. No aortic aneurysm, dissection, or penetrating atherosclerotic ulcer. Coronary artery calcification. Mediastinum/Nodes: Trachea and esophagus are unremarkable. No thoracic adenopathy. Lungs/Pleura: Centrilobular emphysema greatest in the apices. Bronchial wall thickening and mucous plugging in the lower lobes. Right lower lobe atelectasis. Clustered centrilobular micro nodules and tree-in-bud opacities in the right upper lobe. No pleural effusion or pneumothorax. Upper Abdomen: No acute abnormality. Musculoskeletal: No acute fracture.  Thoracic spondylosis. Review of the MIP images confirms the above findings. IMPRESSION: 1. No aortic aneurysm or dissection. 2. Emphysema and chronic bronchitis. Aortic Atherosclerosis (ICD10-I70.0) and Emphysema (ICD10-J43.9).  Electronically Signed   By: Minerva Fester M.D.   On: 05/09/2023 00:15   CT ABDOMEN PELVIS W CONTRAST Result Date: 05/08/2023 CLINICAL DATA:  Left lower quadrant pain EXAM: CT ABDOMEN AND PELVIS WITH CONTRAST TECHNIQUE: Multidetector CT imaging of the abdomen and pelvis was performed using the standard protocol following bolus administration of intravenous contrast. RADIATION DOSE REDUCTION: This exam was performed according to the departmental dose-optimization program which includes automated exposure control, adjustment of the mA and/or kV according to patient size and/or use of iterative reconstruction technique. CONTRAST:  50mL OMNIPAQUE IOHEXOL 350 MG/ML SOLN COMPARISON:  CT 10/30/2015 FINDINGS: Lower chest: Lung bases demonstrate bandlike scarring within the lower lobes and right middle lobe. No acute airspace disease. Hepatobiliary: Subcentimeter hypodensities in the liver too small to further characterize. No calcified gallstone or biliary dilatation. Pancreas: Unremarkable. No  pancreatic ductal dilatation or surrounding inflammatory changes. Spleen: Normal in size without focal abnormality. Adrenals/Urinary Tract: Adrenal glands show no focal abnormality. Kidneys show no hydronephrosis. Simple bilateral renal cysts for which no imaging follow-up is recommended. 2.1 cm superior pole exophytic complex cyst. Bladder partially obscured Stomach/Bowel: Stomach nonenlarged. No dilated small bowel. No acute bowel wall thickening. Diverticular disease of the left colon. Vascular/Lymphatic: Advanced aortic atherosclerosis. No aneurysm. Mildly enlarged left Peri aortic node measuring 19 mm on series 3, image 42. Reproductive: Uterus is unremarkable. Heterogenous left adnexal mass lesion measuring 6.8 x 5.8 by 7 cm concerning for neoplasm. Other: Small volume free fluid within the pelvis. Evidence for peritoneal metastatic disease. 10 mm left upper quadrant peritoneal nodule on series 3, image 35. Right mid  quadrant soft tissue mass measuring 5.7 x 2.4 cm on series 3, image 44. Left lower quadrant soft tissue mass measuring 8.1 by 2.5 by 12.1 cm. Soft tissue nodule in the right colic gutter measuring 1.7 cm on series 3, image 55. Right posterior pelvic soft tissue mass measuring 3.6 x 2.6 cm on series 3, image 76. Additional small mesenteric nodules are noted. Musculoskeletal: No acute or suspicious osseous abnormality. Left hip replacement with artifact. IMPRESSION: 1. Large heterogeneous cystic and solid left adnexal mass highly suspicious for ovarian neoplasm. 2. Positive for peritoneal metastatic disease as evidence by multiple intra-abdominal and pelvic tumor implants. Trace volume ascites in the pelvis. Several mildly enlarged retroperitoneal lymph nodes also suspicious for metastatic disease. 3. Diverticular disease of the left colon without acute inflammatory process. 4. Aortic atherosclerosis. Aortic Atherosclerosis (ICD10-I70.0). Electronically Signed   By: Jasmine Pang M.D.   On: 05/08/2023 19:53   DG Chest 2 View Result Date: 04/27/2023 CLINICAL DATA:  Cough.  Chills and congestion. EXAM: CHEST - 2 VIEW COMPARISON:  03/15/2020. FINDINGS: Redemonstration of irregular opacity overlying the right lateral costophrenic angle, which corresponds to area of scarring/atelectasis on the prior CT scan from 05/15/2022. Bilateral lungs are otherwise clear. No acute consolidation or lung collapse. Bilateral costophrenic angles are clear. Normal cardio-mediastinal silhouette. No acute osseous abnormalities. The soft tissues are within normal limits. IMPRESSION: No active cardiopulmonary disease. Electronically Signed   By: Jules Schick M.D.   On: 04/27/2023 15:46    ASSESSMENT & PLAN:  Left ovarian epithelial cancer (HCC) I reviewed recent pathology report with the patient and family It confirmed diagnosis of cancer from GYN primary; based on recent imaging study, ovarian cancer would be the likely  diagnosis We discussed the role of neoadjuvant chemotherapy with combination treatment of carboplatin and paclitaxel  We reviewed the NCCN guidelines We discussed the role of chemotherapy. The intent is of curative intent.  We discussed some of the risks, benefits, side-effects of carboplatin & Taxol. Treatment is intravenous, every 3 weeks x 6 cycles  Some of the short term side-effects included, though not limited to, including weight loss, life threatening infections, risk of allergic reactions, need for transfusions of blood products, nausea, vomiting, change in bowel habits, loss of hair, admission to hospital for various reasons, and risks of death.   Long term side-effects are also discussed including risks of infertility, permanent damage to nerve function, hearing loss, chronic fatigue, kidney damage with possibility needing hemodialysis, and rare secondary malignancy including bone marrow disorders.  The patient is aware that the response rates discussed earlier is not guaranteed.  After a long discussion, patient made an informed decision to proceed with the prescribed plan of care.   Patient education  material was dispensed. We discussed premedication with dexamethasone before chemotherapy. We will schedule chemo education class and blood work with plan to start treatment next week  Malignant neoplasm of lower-inner quadrant of right breast of female, estrogen receptor positive (HCC) The patient will discontinue aromatase inhibitor and follow-up at the breast cancer clinic while she is receiving treatment here  Orders Placed This Encounter  Procedures   CBC with Differential (Cancer Center Only)    Standing Status:   Future    Expected Date:   05/25/2023    Expiration Date:   05/24/2024   CMP (Cancer Center only)    Standing Status:   Future    Expected Date:   05/25/2023    Expiration Date:   05/24/2024   CBC with Differential (Cancer Center Only)    Standing Status:   Future     Expected Date:   06/15/2023    Expiration Date:   06/14/2024   CMP (Cancer Center only)    Standing Status:   Future    Expected Date:   06/15/2023    Expiration Date:   06/14/2024   CBC with Differential (Cancer Center Only)    Standing Status:   Future    Expected Date:   07/06/2023    Expiration Date:   07/05/2024   CMP (Cancer Center only)    Standing Status:   Future    Expected Date:   07/06/2023    Expiration Date:   07/05/2024   CBC with Differential (Cancer Center Only)    Standing Status:   Future    Expected Date:   07/27/2023    Expiration Date:   07/26/2024   CMP (Cancer Center only)    Standing Status:   Future    Expected Date:   07/27/2023    Expiration Date:   07/26/2024   CBC with Differential (Cancer Center Only)    Standing Status:   Future    Expected Date:   08/17/2023    Expiration Date:   08/16/2024   CMP (Cancer Center only)    Standing Status:   Future    Expected Date:   08/17/2023    Expiration Date:   08/16/2024   CBC with Differential (Cancer Center Only)    Standing Status:   Future    Expected Date:   09/07/2023    Expiration Date:   09/06/2024   CMP (Cancer Center only)    Standing Status:   Future    Expected Date:   09/07/2023    Expiration Date:   09/06/2024    All questions were answered. The patient knows to call the clinic with any problems, questions or concerns. The total time spent in the appointment was 55 minutes encounter with patients including review of chart and various tests results, discussions about plan of care and coordination of care plan   [name], MD 05/17/2023 2:40 PM

## 2023-05-17 NOTE — Assessment & Plan Note (Signed)
I reviewed recent pathology report with the patient and family It confirmed diagnosis of cancer from GYN primary; based on recent imaging study, ovarian cancer would be the likely diagnosis We discussed the role of neoadjuvant chemotherapy with combination treatment of carboplatin and paclitaxel  We reviewed the NCCN guidelines We discussed the role of chemotherapy. The intent is of curative intent.  We discussed some of the risks, benefits, side-effects of carboplatin & Taxol. Treatment is intravenous, every 3 weeks x 6 cycles  Some of the short term side-effects included, though not limited to, including weight loss, life threatening infections, risk of allergic reactions, need for transfusions of blood products, nausea, vomiting, change in bowel habits, loss of hair, admission to hospital for various reasons, and risks of death.   Long term side-effects are also discussed including risks of infertility, permanent damage to nerve function, hearing loss, chronic fatigue, kidney damage with possibility needing hemodialysis, and rare secondary malignancy including bone marrow disorders.  The patient is aware that the response rates discussed earlier is not guaranteed.  After a long discussion, patient made an informed decision to proceed with the prescribed plan of care.   Patient education material was dispensed. We discussed premedication with dexamethasone before chemotherapy. We will schedule chemo education class and blood work with plan to start treatment next week

## 2023-05-17 NOTE — Assessment & Plan Note (Signed)
The patient will discontinue aromatase inhibitor and follow-up at the breast cancer clinic while she is receiving treatment here

## 2023-05-18 ENCOUNTER — Other Ambulatory Visit: Payer: Self-pay

## 2023-05-18 NOTE — Progress Notes (Signed)
Pharmacist Chemotherapy Monitoring - Initial Assessment    Anticipated start date: 05/25/23   The following has been reviewed per standard work regarding the patient's treatment regimen: The patient's diagnosis, treatment plan and drug doses, and organ/hematologic function Lab orders and baseline tests specific to treatment regimen  The treatment plan start date, drug sequencing, and pre-medications Prior authorization status  Patient's documented medication list, including drug-drug interaction screen and prescriptions for anti-emetics and supportive care specific to the treatment regimen The drug concentrations, fluid compatibility, administration routes, and timing of the medications to be used The patient's access for treatment and lifetime cumulative dose history, if applicable  The patient's medication allergies and previous infusion related reactions, if applicable   Changes made to treatment plan:  N/A  Follow up needed:  Pending authorization for treatment    Jerry Caras, PharmD PGY2 Oncology Pharmacy Resident   05/18/2023 12:26 PM

## 2023-05-23 ENCOUNTER — Inpatient Hospital Stay: Payer: Medicare HMO

## 2023-05-23 DIAGNOSIS — Z5111 Encounter for antineoplastic chemotherapy: Secondary | ICD-10-CM | POA: Diagnosis not present

## 2023-05-23 DIAGNOSIS — C50311 Malignant neoplasm of lower-inner quadrant of right female breast: Secondary | ICD-10-CM

## 2023-05-23 DIAGNOSIS — C562 Malignant neoplasm of left ovary: Secondary | ICD-10-CM

## 2023-05-23 LAB — CBC WITH DIFFERENTIAL (CANCER CENTER ONLY)
Abs Immature Granulocytes: 0.01 10*3/uL (ref 0.00–0.07)
Basophils Absolute: 0.1 10*3/uL (ref 0.0–0.1)
Basophils Relative: 1 %
Eosinophils Absolute: 0.2 10*3/uL (ref 0.0–0.5)
Eosinophils Relative: 4 %
HCT: 33.1 % — ABNORMAL LOW (ref 36.0–46.0)
Hemoglobin: 10.6 g/dL — ABNORMAL LOW (ref 12.0–15.0)
Immature Granulocytes: 0 %
Lymphocytes Relative: 22 %
Lymphs Abs: 1 10*3/uL (ref 0.7–4.0)
MCH: 22.9 pg — ABNORMAL LOW (ref 26.0–34.0)
MCHC: 32 g/dL (ref 30.0–36.0)
MCV: 71.6 fL — ABNORMAL LOW (ref 80.0–100.0)
Monocytes Absolute: 0.5 10*3/uL (ref 0.1–1.0)
Monocytes Relative: 12 %
Neutro Abs: 2.8 10*3/uL (ref 1.7–7.7)
Neutrophils Relative %: 61 %
Platelet Count: 257 10*3/uL (ref 150–400)
RBC: 4.62 MIL/uL (ref 3.87–5.11)
RDW: 15.5 % (ref 11.5–15.5)
WBC Count: 4.5 10*3/uL (ref 4.0–10.5)
nRBC: 0 % (ref 0.0–0.2)

## 2023-05-23 LAB — CMP (CANCER CENTER ONLY)
ALT: 11 U/L (ref 0–44)
AST: 31 U/L (ref 15–41)
Albumin: 3.8 g/dL (ref 3.5–5.0)
Alkaline Phosphatase: 57 U/L (ref 38–126)
Anion gap: 3 — ABNORMAL LOW (ref 5–15)
BUN: 16 mg/dL (ref 8–23)
CO2: 30 mmol/L (ref 22–32)
Calcium: 9.5 mg/dL (ref 8.9–10.3)
Chloride: 107 mmol/L (ref 98–111)
Creatinine: 0.72 mg/dL (ref 0.44–1.00)
GFR, Estimated: >60 mL/min (ref >60)
Glucose, Bld: 70 mg/dL (ref 70–99)
Potassium: 4.3 mmol/L (ref 3.5–5.1)
Sodium: 140 mmol/L (ref 135–145)
Total Bilirubin: 0.4 mg/dL (ref 0.0–1.2)
Total Protein: 7.5 g/dL (ref 6.5–8.1)

## 2023-05-23 NOTE — Research (Signed)
Trial Name: [id number]: Effectiveness of Out of Pocket Cost Communication and Financial Navigation (CostCOM) in Cancer Patients  Patient Catherine Lowery was identified by Dr Bertis Ruddy as a potential candidate for the above listed study.  This Clinical Research Nurse met with LANET VANDEHEY, [id number] on 05/23/23 in a manner and location that ensures patient privacy to discuss participation in the above listed research study.  Patient is Accompanied by her sister and brother in law .  Patient was previously provided with informed consent documents.  Patient confirmed they have read the informed consent documents.  As outlined in the informed consent form, this Nurse and Lilia Pro discussed the purpose of the research study, the investigational nature of the study, study procedures and requirements for study participation, potential risks and benefits of study participation, as well as alternatives to participation.  This study is not blinded or double-blinded. The patient understands participation is voluntary and they may withdraw from study participation at any time.  Each study arm was reviewed, and randomization discussed.  This study does not involve an investigational drug or device. This study does not involve a placebo. Patient understands enrollment is pending full eligibility review.   Confidentiality and how the patient's information will be used as part of study participation were discussed.  Patient was informed there is not reimbursement provided for their time and effort spent on trial participation.  The patient is encouraged to discuss research study participation with their insurance provider to determine what costs they may incur as part of study participation, including research related injury.    All questions were answered to patient's satisfaction.  The informed consent and separate HIPAA Authorization was reviewed page by page.  The patient's mental and emotional status is  appropriate to provide informed consent, and the patient verbalizes an understanding of study participation.  Patient has agreed to participate in the above listed research study and has voluntarily signed the informed consent version date 03/01/2023 and separate HIPAA Authorization, version approval date 01/29/2023  on 05/23/23 at 1100AM.  The patient was provided with a copy of the signed informed consent form and separate HIPAA Authorization for their reference.  No study specific procedures were obtained prior to the signing of the informed consent document.  Approximately 20 minutes were spent with the patient reviewing the informed consent documents.  Patient was not requested to complete a Release of Information form.  Confirmed phone number [phone] and email address [email or web address].  Margret Chance Cyd Hostler, RN, BSN, [facility] She  Her  Hers Clinical Research Nurse [facility] Direct Dial [phone] 05/23/2023 12:34 PM  ADDENDUM:  Patient reports she will be getting her treatment and monitoring at [facility] ([id number]) and will be followed by Dr Bertis Ruddy.  Margret Chance Alajiah Dutkiewicz, RN, BSN, [facility] She  Her  Hers Clinical Research Nurse [facility] Direct Dial [phone] 05/24/2023 8:29 AM

## 2023-05-24 ENCOUNTER — Other Ambulatory Visit: Payer: Self-pay

## 2023-05-24 ENCOUNTER — Encounter: Payer: Self-pay | Admitting: Hematology and Oncology

## 2023-05-24 DIAGNOSIS — Z170 Estrogen receptor positive status [ER+]: Secondary | ICD-10-CM

## 2023-05-24 MED FILL — Fosaprepitant Dimeglumine For IV Infusion 150 MG (Base Eq): INTRAVENOUS | Qty: 5 | Status: AC

## 2023-05-24 NOTE — Research (Signed)
[id number]- Effectiveness of Out-of Pocket Cost Communication and Financial Navigation (CostCOM) in Cancer Patients:    This Nurse has reviewed this patient's inclusion and exclusion criteria as a second review and confirms Catherine Lowery is eligible for study participation.  Patient may continue with enrollment.  Zerita Boers BSN RN Clinical Research Nurse [facility] Direct Dial: [phone] 05/24/2023  2:25 PM

## 2023-05-24 NOTE — Research (Signed)
Effectiveness of Out-of-Pocket Psychologist, forensic (CostCOM) in Cancer Patients  Confirmed with patient that she is not enrolled in any other clinical trials, including [id number] or [id number], treatment trials, and trials providing OOPC communication or financial navigation.  Margret Chance Leaha Cuervo, RN, BSN, [facility] She  Her  Hers Clinical Research Nurse [facility] Direct Dial [phone] 05/24/2023 8:35 AM

## 2023-05-24 NOTE — Progress Notes (Signed)
FMLA Forms completed for Catherine Lowery as requested. Fax transmission confirmation received. Copy of forms placed for pick-up and emailed as requested. No other needs or concerns noted at this time.

## 2023-05-24 NOTE — Research (Signed)
Effectiveness of Out-of-Pocket Psychologist, forensic (CostCOM) in Cancer Patients   This Nurse has reviewed this patient's inclusion and exclusion criteria and confirmed Catherine Lowery is eligible for study participation.  Patient will continue with enrollment.  Eligibility confirmed by treating investigator, who also agrees that patient should proceed with enrollment.  [name], RN, BSN, [facility] She  Her  Hers Clinical Research Nurse [facility] Direct Dial [phone] 05/24/2023 9:14 AM

## 2023-05-25 ENCOUNTER — Inpatient Hospital Stay: Payer: Medicare HMO

## 2023-05-25 ENCOUNTER — Encounter: Payer: Self-pay | Admitting: Hematology and Oncology

## 2023-05-25 VITALS — BP 120/54 | HR 54 | Temp 98.2°F | Resp 16 | Wt 134.5 lb

## 2023-05-25 DIAGNOSIS — Z5111 Encounter for antineoplastic chemotherapy: Secondary | ICD-10-CM | POA: Diagnosis not present

## 2023-05-25 DIAGNOSIS — C562 Malignant neoplasm of left ovary: Secondary | ICD-10-CM

## 2023-05-25 MED ORDER — SODIUM CHLORIDE 0.9 % IV SOLN
150.0000 mg | Freq: Once | INTRAVENOUS | Status: AC
  Administered 2023-05-25: 150 mg via INTRAVENOUS
  Filled 2023-05-25: qty 150

## 2023-05-25 MED ORDER — SODIUM CHLORIDE 0.9 % IV SOLN
405.6000 mg | Freq: Once | INTRAVENOUS | Status: AC
  Administered 2023-05-25: 410 mg via INTRAVENOUS
  Filled 2023-05-25: qty 41

## 2023-05-25 MED ORDER — SODIUM CHLORIDE 0.9 % IV SOLN: INTRAVENOUS | Status: DC

## 2023-05-25 MED ORDER — PALONOSETRON HCL INJECTION 0.25 MG/5ML
0.2500 mg | Freq: Once | INTRAVENOUS | Status: AC
  Administered 2023-05-25: 0.25 mg via INTRAVENOUS
  Filled 2023-05-25: qty 5

## 2023-05-25 MED ORDER — FAMOTIDINE IN NACL 20-0.9 MG/50ML-% IV SOLN
20.0000 mg | Freq: Once | INTRAVENOUS | Status: AC
Start: 2023-05-25 — End: 2023-05-25
  Administered 2023-05-25: 20 mg via INTRAVENOUS
  Filled 2023-05-25: qty 50

## 2023-05-25 MED ORDER — HEPARIN SOD (PORK) LOCK FLUSH 100 UNIT/ML IV SOLN
500.0000 [IU] | Freq: Once | INTRAVENOUS | Status: AC | PRN
  Administered 2023-05-25: 500 [IU]

## 2023-05-25 MED ORDER — SODIUM CHLORIDE 0.9% FLUSH
10.0000 mL | INTRAVENOUS | Status: DC | PRN
  Administered 2023-05-25: 10 mL

## 2023-05-25 MED ORDER — SODIUM CHLORIDE 0.9 % IV SOLN
175.0000 mg/m2 | Freq: Once | INTRAVENOUS | Status: AC
  Administered 2023-05-25: 300 mg via INTRAVENOUS
  Filled 2023-05-25: qty 50

## 2023-05-25 MED ORDER — CETIRIZINE HCL 10 MG/ML IV SOLN
10.0000 mg | Freq: Once | INTRAVENOUS | Status: AC
  Administered 2023-05-25: 10 mg via INTRAVENOUS
  Filled 2023-05-25: qty 1

## 2023-05-25 MED ORDER — DEXAMETHASONE SODIUM PHOSPHATE 10 MG/ML IJ SOLN
10.0000 mg | Freq: Once | INTRAMUSCULAR | Status: AC
  Administered 2023-05-25: 10 mg via INTRAVENOUS
  Filled 2023-05-25: qty 1

## 2023-05-25 NOTE — Patient Instructions (Signed)
[facility] - A DEPT OF [facility]  Discharge Instructions: Thank you for choosing [facility] to provide your oncology and hematology care.   If you have a lab appointment with the Cancer Center, please go directly to the Cancer Center and check in at the registration area.   Wear comfortable clothing and clothing appropriate for easy access to any Portacath or PICC line.   We strive to give you quality time with your provider. You may need to reschedule your appointment if you arrive late (15 or more minutes).  Arriving late affects you and other patients whose appointments are after yours.  Also, if you miss three or more appointments without notifying the office, you may be dismissed from the clinic at the provider's discretion.      For prescription refill requests, have your pharmacy contact our office and allow 72 hours for refills to be completed.    Today you received the following chemotherapy and/or immunotherapy agents paclitaxel, carboplatin      To help prevent nausea and vomiting after your treatment, we encourage you to take your nausea medication as directed.  BELOW ARE SYMPTOMS THAT SHOULD BE REPORTED IMMEDIATELY: *FEVER GREATER THAN 100.4 F (38 C) OR HIGHER *CHILLS OR SWEATING *NAUSEA AND VOMITING THAT IS NOT CONTROLLED WITH YOUR NAUSEA MEDICATION *UNUSUAL SHORTNESS OF BREATH *UNUSUAL BRUISING OR BLEEDING *URINARY PROBLEMS (pain or burning when urinating, or frequent urination) *BOWEL PROBLEMS (unusual diarrhea, constipation, pain near the anus) TENDERNESS IN MOUTH AND THROAT WITH OR WITHOUT PRESENCE OF ULCERS (sore throat, sores in mouth, or a toothache) UNUSUAL RASH, SWELLING OR PAIN  UNUSUAL VAGINAL DISCHARGE OR ITCHING   Items with * indicate a potential emergency and should be followed up as soon as possible or go to the Emergency Department if any problems should occur.  Please show the CHEMOTHERAPY ALERT CARD or  IMMUNOTHERAPY ALERT CARD at check-in to the Emergency Department and triage nurse.  Should you have questions after your visit or need to cancel or reschedule your appointment, please contact [facility] - A DEPT OF [facility]  Dept: [phone]  and follow the prompts.  Office hours are 8:00 a.m. to 4:30 p.m. Monday - Friday. Please note that voicemails left after 4:00 p.m. may not be returned until the following business day.  We are closed weekends and major holidays. You have access to a nurse at all times for urgent questions. Please call the main number to the clinic Dept: [phone] and follow the prompts.   For any non-urgent questions, you may also contact your provider using MyChart. We now offer e-Visits for anyone 24 and older to request care online for non-urgent symptoms. For details visit [email or web address].   Also download the MyChart app! Go to the app store, search "MyChart", open the app, select [place], and log in with your MyChart username and password.

## 2023-05-27 ENCOUNTER — Other Ambulatory Visit: Payer: Self-pay

## 2023-05-28 ENCOUNTER — Telehealth: Payer: Self-pay

## 2023-05-28 NOTE — Telephone Encounter (Signed)
Ms Goggins states that she is having aches in her hips and legs. Told her that this was from the taxol. She can apply a heating pad and take tylenol. If the aches/pain gets worse, she needs to call Dr. Maxine Glenn nurse. She is eating small amounts and needs to increase her fluids to at least 64 oz a day. She used her nausea medication with good effect. She is urinating well. She knows to call the office at [phone] if  she has any questions or concerns.

## 2023-05-28 NOTE — Telephone Encounter (Signed)
-----   Message from Nurse Guilford Shi sent at 05/25/2023  2:52 PM EST ----- Regarding: FT Chemo- [place] First time 3hr taxol/carbo. Dr. Bertis Ruddy. Pt completed chemo successfully!

## 2023-06-04 ENCOUNTER — Other Ambulatory Visit: Payer: Self-pay

## 2023-06-04 ENCOUNTER — Inpatient Hospital Stay: Payer: Medicare HMO | Attending: Psychiatry | Admitting: Psychiatry

## 2023-06-04 ENCOUNTER — Telehealth: Payer: Self-pay

## 2023-06-04 ENCOUNTER — Encounter: Payer: Self-pay | Admitting: Psychiatry

## 2023-06-04 VITALS — BP 115/50 | HR 69 | Temp 97.6°F | Resp 16 | Wt 132.0 lb

## 2023-06-04 DIAGNOSIS — C786 Secondary malignant neoplasm of retroperitoneum and peritoneum: Secondary | ICD-10-CM | POA: Insufficient documentation

## 2023-06-04 DIAGNOSIS — C562 Malignant neoplasm of left ovary: Secondary | ICD-10-CM

## 2023-06-04 DIAGNOSIS — Z923 Personal history of irradiation: Secondary | ICD-10-CM | POA: Insufficient documentation

## 2023-06-04 DIAGNOSIS — Z853 Personal history of malignant neoplasm of breast: Secondary | ICD-10-CM | POA: Insufficient documentation

## 2023-06-04 NOTE — Progress Notes (Unsigned)
Gynecologic Oncology Return Clinic Visit  Date of Service: 06/04/2023 Medical Oncologist: 05/09/23  Assessment & Plan: Catherine Lowery is a 78 y.o. woman with Stage *** who presents for ***.  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  Clide Cliff, MD Gynecologic Oncology   Medical Decision Making I personally spent  TOTAL *** minutes face-to-face and non-face-to-face in the care of this patient, which includes all pre, intra, and post visit time on the date of service.   ----------------------- Reason for Visit: ***  Treatment History: Oncology History  Malignant neoplasm of lower-inner quadrant of right breast of female, estrogen receptor positive (HCC)  03/18/2018 Initial Diagnosis   Screening mammography on 02/27/2019 revealed a right breast abnormality, Right breast lower inner quadrant biopsy for a clinical T2N0, [name], grade 2 or 3, estrogen and progesterone receptor positive, [name], with an MIB-1 of 5%   05/07/2018 Cancer Staging   Staging form: Breast, AJCC 8th Edition - Pathologic stage from 05/07/2018: Stage IB (pT2, pN14mi, cM0, G2, ER+, PR+, HER2-) - Signed by Loa Socks, NP on 04/07/2019   05/07/2018 Surgery   pT2 pN1, [name], grade 2, with a positive inferior margin.   05/07/2018 Oncotype testing   MammaPrint obtained from the definitive surgical sample read as "low risk" predicting a disease-free survival at 5 years of 97.8% without need of chemotherapy.   05/18/2018 Genetic Testing   The Common Hereditary Cancers Panel + Thyroid Caner panel was ordered (55 genes): APC, [name], [name], [name], [name], [name],  [name], [name], [name], [name], [name], [name], [name], [name], [name], [name], [name], [name], [name], [name], [name], KIT, MEN1, [name], [name], [name], [name], [name], [name], [name], [name], [name], [name], [name], [name], [name], POLE, [name], [name], RAD50, [name], [name], [name], [name], [name], [name], [name], [name], [name], [name], [name], [name], [name], [name], [name], VHL.  Results: No pathogenic variants identified.  2 variants of uncertain significance in the genes [name] c.1176C>T (Silent) and [name] c.1419+4C>T (Intronic) were identified.  The date of this test report is 05/18/2018.   UPDATE: The [name] c.1419+4C>T (Intronic) VUS has been reclassified to "Likely Benign." The report date is 08/08/2018. UPDATE: The [name] c.1176C>T (Silent) VUS has been reclassified to "Likely Benign." The report date is 09/20/2022.    06/05/2018 Surgery   ([id number]) Reexcision of the right breast; all margins negative.   07/11/2018 - 08/27/2018 Radiation Therapy   The patient initially received a dose of 50.4 Gy in 28 fractions to the breast using whole-breast tangent fields with an additional 45 Gy in 25 fractions to the right axilla. This was delivered using a 3-D conformal technique. The pt received a boost delivering an additional 12 Gy in 6 fractions using a electron boost with electrons. The total dose was 107.4 Gy.    09/2018 - 09/2023 Anti-estrogen oral therapy   anastrozole   Left ovarian epithelial cancer (HCC)  05/08/2023 Initial Diagnosis   Left ovarian epithelial cancer (HCC)   05/08/2023 Imaging   IR IMAGING GUIDED PORT INSERTION Result Date: 05/10/2023 INDICATION: Concern for metastatic ovarian cancer. Please perform image guided biopsy of peritoneal mass for tissue diagnostic purposes. Please perform image guided Port a catheter placement for durable intravenous access  for impending chemotherapy. EXAM: 1. IMPLANTED PORT A CATH PLACEMENT WITH ULTRASOUND AND FLUOROSCOPIC GUIDANCE 2. ULTRASOUND-GUIDED LEFT VENTRAL LATERAL  PERITONEAL ABDOMINAL MASS BIOPSY COMPARISON:  Chest CT-05/09/2023 CT abdomen and pelvis-05/08/2023 MEDICATIONS: None ANESTHESIA/SEDATION: Moderate (conscious) sedation was employed during this procedure as administered by the Interventional Radiology RN. A total of Versed 3 mg and Fentanyl 125 mcg was administered intravenously. Moderate Sedation Time: 46 minutes. The patient's level of consciousness and vital signs were monitored continuously by radiology nursing throughout the procedure under my direct supervision. CONTRAST:  None FLUOROSCOPY TIME:  42 seconds (4 mGy) COMPLICATIONS: None immediate. PROCEDURE: The procedure, risks, benefits, and alternatives were explained to the patient. Questions regarding the procedure were encouraged and answered. The patient understands and consents to the procedure. The right neck and chest were prepped with chlorhexidine in a sterile fashion, and a sterile drape was applied covering the operative field. Maximum barrier sterile technique with sterile gowns and gloves were used for the procedure. A timeout was performed prior to the initiation of the procedure. Local anesthesia was provided with 1% lidocaine with epinephrine. After creating a small venotomy incision, a micropuncture kit was utilized to access the internal jugular vein. Real-time ultrasound guidance was utilized for vascular access including the acquisition of a permanent ultrasound image documenting patency of the accessed vessel. The microwire was utilized to measure appropriate catheter length. A subcutaneous port pocket was then created along the upper chest wall utilizing a combination of sharp and blunt dissection. The pocket was irrigated with sterile saline. A single lumen Slim sized power injectable port was chosen for placement. The 8 Fr catheter was tunneled from the port pocket site to the venotomy incision. The port was placed in the pocket. The external catheter was trimmed to appropriate length.  At the venotomy, an 8 Fr peel-away sheath was placed over a guidewire under fluoroscopic guidance. The catheter was then placed through the sheath and the sheath was removed. Final catheter positioning was confirmed and documented with a fluoroscopic spot radiograph. The port was accessed with a Huber needle, aspirated and flushed with heparinized saline. The venotomy site was closed with an interrupted 4-0 Vicryl suture. The port pocket incision was closed with interrupted 2-0 Vicryl suture. Dermabond and Steri-strips were applied to both incisions. Dressings were applied. Attention was now paid towards image guided biopsy of dominant peritoneal mass. Sonographic evaluation performed of the ventral aspect of the left mid abdomen demonstrating an at least 8.3 x 2.1 cm hypoechoic macrolobulated mass (image 1), correlating with the mass seen on preceding abdominal CT image 50, series 3. The procedure was planned. The skin overlying the operative site was prepped and draped in usual sterile fashion. After the overlying soft tissues were anesthetized with 1% lidocaine with epinephrine, a 17 gauge coaxial needle was advanced into the peripheral aspect of the mass. Next, under direct ultrasound guidance, 6 core needle biopsies were obtained yielding the return of adequate tissue. Samples were submitted to the laboratory for pathologic analysis. The coaxial needle was removed following the administration of a Gel-Foam slurry and superficial hemostasis achieved with manual compression. Postprocedural imaging was negative for complication, specifically, no significant Peri biopsy hematoma. A dressing was applied. The patient tolerated the above procedures well without immediate post procedural complication. FINDINGS: After catheter placement, the tip lies within the superior cavoatrial junction. The catheter aspirates and flushes normally and is ready for immediate use. Sonographic evaluation of the left mid abdomen  demonstrates an at least 8.3 x 2.1  cm hypoechoic macrolobulated mass, correlating with the mass seen on preceding abdominal CT. IMPRESSION: 1. Successful placement of a right internal jugular approach power injectable Port-A-Cath. The catheter is ready for immediate use. 2. Successful ultrasound-guided biopsy of peritoneal mass within the ventral aspect of the left mid/lateral abdomen. Electronically Signed   By: Simonne Come M.D.   On: 05/10/2023 14:59   IR US Guide Bx Asp/Drain Result Date: 05/10/2023 INDICATION: Concern for metastatic ovarian cancer. Please perform image guided biopsy of peritoneal mass for tissue diagnostic purposes. Please perform image guided Port a catheter placement for durable intravenous access for impending chemotherapy. EXAM: 1. IMPLANTED PORT A CATH PLACEMENT WITH ULTRASOUND AND FLUOROSCOPIC GUIDANCE 2. ULTRASOUND-GUIDED LEFT VENTRAL LATERAL PERITONEAL ABDOMINAL MASS BIOPSY COMPARISON:  Chest CT-05/09/2023 CT abdomen and pelvis-05/08/2023 MEDICATIONS: None ANESTHESIA/SEDATION: Moderate (conscious) sedation was employed during this procedure as administered by the Interventional Radiology RN. A total of Versed 3 mg and Fentanyl 125 mcg was administered intravenously. Moderate Sedation Time: 46 minutes. The patient's level of consciousness and vital signs were monitored continuously by radiology nursing throughout the procedure under my direct supervision. CONTRAST:  None FLUOROSCOPY TIME:  42 seconds (4 mGy) COMPLICATIONS: None immediate. PROCEDURE: The procedure, risks, benefits, and alternatives were explained to the patient. Questions regarding the procedure were encouraged and answered. The patient understands and consents to the procedure. The right neck and chest were prepped with chlorhexidine in a sterile fashion, and a sterile drape was applied covering the operative field. Maximum barrier sterile technique with sterile gowns and gloves were used for the procedure. A timeout  was performed prior to the initiation of the procedure. Local anesthesia was provided with 1% lidocaine with epinephrine. After creating a small venotomy incision, a micropuncture kit was utilized to access the internal jugular vein. Real-time ultrasound guidance was utilized for vascular access including the acquisition of a permanent ultrasound image documenting patency of the accessed vessel. The microwire was utilized to measure appropriate catheter length. A subcutaneous port pocket was then created along the upper chest wall utilizing a combination of sharp and blunt dissection. The pocket was irrigated with sterile saline. A single lumen Slim sized power injectable port was chosen for placement. The 8 Fr catheter was tunneled from the port pocket site to the venotomy incision. The port was placed in the pocket. The external catheter was trimmed to appropriate length. At the venotomy, an 8 Fr peel-away sheath was placed over a guidewire under fluoroscopic guidance. The catheter was then placed through the sheath and the sheath was removed. Final catheter positioning was confirmed and documented with a fluoroscopic spot radiograph. The port was accessed with a Huber needle, aspirated and flushed with heparinized saline. The venotomy site was closed with an interrupted 4-0 Vicryl suture. The port pocket incision was closed with interrupted 2-0 Vicryl suture. Dermabond and Steri-strips were applied to both incisions. Dressings were applied. Attention was now paid towards image guided biopsy of dominant peritoneal mass. Sonographic evaluation performed of the ventral aspect of the left mid abdomen demonstrating an at least 8.3 x 2.1 cm hypoechoic macrolobulated mass (image 1), correlating with the mass seen on preceding abdominal CT image 50, series 3. The procedure was planned. The skin overlying the operative site was prepped and draped in usual sterile fashion. After the overlying soft tissues were anesthetized  with 1% lidocaine with epinephrine, a 17 gauge coaxial needle was advanced into the peripheral aspect of the mass. Next, under direct ultrasound guidance, 6 core needle biopsies  were obtained yielding the return of adequate tissue. Samples were submitted to the laboratory for pathologic analysis. The coaxial needle was removed following the administration of a Gel-Foam slurry and superficial hemostasis achieved with manual compression. Postprocedural imaging was negative for complication, specifically, no significant Peri biopsy hematoma. A dressing was applied. The patient tolerated the above procedures well without immediate post procedural complication. FINDINGS: After catheter placement, the tip lies within the superior cavoatrial junction. The catheter aspirates and flushes normally and is ready for immediate use. Sonographic evaluation of the left mid abdomen demonstrates an at least 8.3 x 2.1 cm hypoechoic macrolobulated mass, correlating with the mass seen on preceding abdominal CT. IMPRESSION: 1. Successful placement of a right internal jugular approach power injectable Port-A-Cath. The catheter is ready for immediate use. 2. Successful ultrasound-guided biopsy of peritoneal mass within the ventral aspect of the left mid/lateral abdomen. Electronically Signed   By: Simonne Come M.D.   On: 05/10/2023 14:59   CT Angio Chest Aorta W and/or Wo Contrast Result Date: 05/09/2023 CLINICAL DATA:  Constipation and abdominal pain. Acute aortic syndrome suspected. EXAM: CT ANGIOGRAPHY CHEST WITH CONTRAST TECHNIQUE: Multidetector CT imaging of the chest was performed using the standard protocol during bolus administration of intravenous contrast. Multiplanar CT image reconstructions and MIPs were obtained to evaluate the vascular anatomy. RADIATION DOSE REDUCTION: This exam was performed according to the departmental dose-optimization program which includes automated exposure control, adjustment of the mA and/or kV  according to patient size and/or use of iterative reconstruction technique. CONTRAST:  60mL OMNIPAQUE IOHEXOL 350 MG/ML SOLN COMPARISON:  Radiograph 04/27/2023 and CT chest 05/15/2022 FINDINGS: Cardiovascular: Normal heart size. No pericardial effusion. Aortic atherosclerotic calcification. No aortic aneurysm, dissection, or penetrating atherosclerotic ulcer. Coronary artery calcification. Mediastinum/Nodes: Trachea and esophagus are unremarkable. No thoracic adenopathy. Lungs/Pleura: Centrilobular emphysema greatest in the apices. Bronchial wall thickening and mucous plugging in the lower lobes. Right lower lobe atelectasis. Clustered centrilobular micro nodules and tree-in-bud opacities in the right upper lobe. No pleural effusion or pneumothorax. Upper Abdomen: No acute abnormality. Musculoskeletal: No acute fracture.  Thoracic spondylosis. Review of the MIP images confirms the above findings. IMPRESSION: 1. No aortic aneurysm or dissection. 2. Emphysema and chronic bronchitis. Aortic Atherosclerosis (ICD10-I70.0) and Emphysema (ICD10-J43.9). Electronically Signed   By: Minerva Fester M.D.   On: 05/09/2023 00:15   CT ABDOMEN PELVIS W CONTRAST Result Date: 05/08/2023 CLINICAL DATA:  Left lower quadrant pain EXAM: CT ABDOMEN AND PELVIS WITH CONTRAST TECHNIQUE: Multidetector CT imaging of the abdomen and pelvis was performed using the standard protocol following bolus administration of intravenous contrast. RADIATION DOSE REDUCTION: This exam was performed according to the departmental dose-optimization program which includes automated exposure control, adjustment of the mA and/or kV according to patient size and/or use of iterative reconstruction technique. CONTRAST:  50mL OMNIPAQUE IOHEXOL 350 MG/ML SOLN COMPARISON:  CT 10/30/2015 FINDINGS: Lower chest: Lung bases demonstrate bandlike scarring within the lower lobes and right middle lobe. No acute airspace disease. Hepatobiliary: Subcentimeter hypodensities in  the liver too small to further characterize. No calcified gallstone or biliary dilatation. Pancreas: Unremarkable. No pancreatic ductal dilatation or surrounding inflammatory changes. Spleen: Normal in size without focal abnormality. Adrenals/Urinary Tract: Adrenal glands show no focal abnormality. Kidneys show no hydronephrosis. Simple bilateral renal cysts for which no imaging follow-up is recommended. 2.1 cm superior pole exophytic complex cyst. Bladder partially obscured Stomach/Bowel: Stomach nonenlarged. No dilated small bowel. No acute bowel wall thickening. Diverticular disease of the left colon. Vascular/Lymphatic: Advanced aortic  atherosclerosis. No aneurysm. Mildly enlarged left Peri aortic node measuring 19 mm on series 3, image 42. Reproductive: Uterus is unremarkable. Heterogenous left adnexal mass lesion measuring 6.8 x 5.8 by 7 cm concerning for neoplasm. Other: Small volume free fluid within the pelvis. Evidence for peritoneal metastatic disease. 10 mm left upper quadrant peritoneal nodule on series 3, image 35. Right mid quadrant soft tissue mass measuring 5.7 x 2.4 cm on series 3, image 44. Left lower quadrant soft tissue mass measuring 8.1 by 2.5 by 12.1 cm. Soft tissue nodule in the right colic gutter measuring 1.7 cm on series 3, image 55. Right posterior pelvic soft tissue mass measuring 3.6 x 2.6 cm on series 3, image 76. Additional small mesenteric nodules are noted. Musculoskeletal: No acute or suspicious osseous abnormality. Left hip replacement with artifact. IMPRESSION: 1. Large heterogeneous cystic and solid left adnexal mass highly suspicious for ovarian neoplasm. 2. Positive for peritoneal metastatic disease as evidence by multiple intra-abdominal and pelvic tumor implants. Trace volume ascites in the pelvis. Several mildly enlarged retroperitoneal lymph nodes also suspicious for metastatic disease. 3. Diverticular disease of the left colon without acute inflammatory process. 4.  Aortic atherosclerosis. Aortic Atherosclerosis (ICD10-I70.0). Electronically Signed   By: Jasmine Pang M.D.   On: 05/08/2023 19:53   DG Chest 2 View Result Date: 04/27/2023 CLINICAL DATA:  Cough.  Chills and congestion. EXAM: CHEST - 2 VIEW COMPARISON:  03/15/2020. FINDINGS: Redemonstration of irregular opacity overlying the right lateral costophrenic angle, which corresponds to area of scarring/atelectasis on the prior CT scan from 05/15/2022. Bilateral lungs are otherwise clear. No acute consolidation or lung collapse. Bilateral costophrenic angles are clear. Normal cardio-mediastinal silhouette. No acute osseous abnormalities. The soft tissues are within normal limits. IMPRESSION: No active cardiopulmonary disease. Electronically Signed   By: Jules Schick M.D.   On: 04/27/2023 15:46      05/09/2023 Tumor Marker   Patient's tumor was tested for the following markers: CA-125. Results of the tumor marker test revealed 137.   05/10/2023 Pathology Results   SURGICAL PATHOLOGY  CASE: [id number]  PATIENT: Catherine Lowery  Surgical Pathology Report   Clinical History: concern for metastatic ovarian cancer (cm)   FINAL MICROSCOPIC DIAGNOSIS:   A. OMENTUM, MASS WITHIN LEFT MID ABDOMEN, NEEDLE CORE BIOPSY:  - Metastatic carcinoma with a gynecologic primary, see comment  Immunohistochemical stain show that the tumor cells are positive for CK7 and PAX8 while they are negative for CK20 and GATA3 with above interpretation.  Dr. Kenyon Ana reviewed the case and concurs with the above diagnosis.    05/14/2023 Cancer Staging   Staging form: Ovary, Fallopian Tube, and Primary Peritoneal Carcinoma, AJCC 8th Edition - Clinical stage from 05/14/2023: FIGO Stage IIIC (cT3c, cN1, cM0) - Signed by [name], MD on 05/14/2023 Stage prefix: Initial diagnosis   05/25/2023 -  Chemotherapy   Patient is on Treatment Plan : OVARIAN Carboplatin (AUC 6) + Paclitaxel (175) q21d X 6 Cycles       Interval  History: *** Nausea, no vomiting, diarrhea, the week after treatmetn Now better  No pain No vaginal bleeding Eatin okay with not have nausea Dulcolax helping with BM  PCP manages COPD, no issues with breathing these days  Past Medical/Surgical History: Past Medical History:  Diagnosis Date   Cancer (HCC) 03/2018   right breast cancer   Chronic kidney disease    CKD   COPD (chronic obstructive pulmonary disease) (HCC)    DJD (degenerative joint disease)    LEFT  HIP   Family history of breast cancer    Family history of ovarian cancer    Family history of prostate cancer    Family history of prostate cancer    H/O migraine 07/01/2019   Hyperlipidemia    takes Simvasatin daily   Hypertension    takes Propranolol and HCTZ daioly   Hyperthyroidism    Pneumonia 10/2015   PONV (postoperative nausea and vomiting)    when ether used   Reactive airway disease 05/08/2023   Shortness of breath dyspnea    with ambulation   Tremors of nervous system    takes Primidone daily    Past Surgical History:  Procedure Laterality Date   BOne Spur     left heel   BREAST LUMPECTOMY WITH RADIOACTIVE SEED AND SENTINEL LYMPH NODE BIOPSY Right 05/07/2018   Procedure: RIGHT BREAST LUMPECTOMY WITH RADIOACTIVE SEED AND RIGHT  SENTINEL LYMPH NODE MAPPING;  Surgeon: Harriette Bouillon, MD;  Location: [facility];  Service: General;  Laterality: Right;   COLONOSCOPY     IR IMAGING GUIDED PORT INSERTION  05/10/2023   IR US GUIDE BX ASP/DRAIN  05/10/2023   JOINT REPLACEMENT Bilateral    knee   KNEE SURGERY     RE-EXCISION OF BREAST LUMPECTOMY Right 06/05/2018   Procedure: RE-EXCISION OF RIGHT BREAST LUMPECTOMY;  Surgeon: Harriette Bouillon, MD;  Location: [facility];  Service: General;  Laterality: Right;   ROTATOR CUFF REPAIR Bilateral    TOTAL HIP ARTHROPLASTY Right    TOTAL HIP ARTHROPLASTY Left 12/29/2015   Procedure: TOTAL HIP ARTHROPLASTY ANTERIOR APPROACH;   Surgeon: Loreta Ave, MD;  Location: [facility];  Service: Orthopedics;  Laterality: Left;    Family History  Problem Relation Age of Onset   Hypertension Mother    Heart disease Mother    Stroke Mother    Multiple myeloma Father 52   Thyroid cancer Sister        dx 60's/50's   Cancer Paternal Aunt        unknown   Prostate cancer Paternal Uncle        dx >50- cancer was the cause of his death   Ovarian cancer Paternal Aunt    Throat cancer Paternal Uncle    Breast cancer Cousin        dx >50   Cancer Cousin        type unk   Cancer Cousin        type unk   Cancer Cousin        type unk    Social History   Socioeconomic History   Marital status: Single    Spouse name: Not on file   Number of children: 1   Years of education: 14   Highest education level: Not on file  Occupational History    Comment: retired  Tobacco Use   Smoking status: Every Day    Current packs/day: 0.75    Average packs/day: 0.8 packs/day for 28.0 years (21.0 ttl pk-yrs)    Types: Cigarettes   Smokeless tobacco: Never   Tobacco comments:    02/13/22 Pt stated "I'm trying to quit" Glynda Jaeger, CMA   Vaping Use   Vaping status: Never Used  Substance and Sexual Activity   Alcohol use: No    Alcohol/week: 0.0 standard drinks of alcohol   Drug use: No   Sexual activity: Not Currently    Birth control/protection: Post-menopausal  Other Topics Concern   Not  on file  Social History Narrative   Lives with handicapped son   Caffeine- coffee, 2 cups daily, 1-2 sodas daily   Social Drivers of Health   Financial Resource Strain: Low Risk  (01/28/2020)   Overall Financial Resource Strain (CARDIA)    Difficulty of Paying Living Expenses: Not very hard  Food Insecurity: No Food Insecurity (05/09/2023)   Hunger Vital Sign    Worried About Running Out of Food in the Last Year: Never true    Ran Out of Food in the Last Year: Never true  Transportation Needs: No Transportation Needs  (05/09/2023)   PRAPARE - Administrator, Civil Service (Medical): No    Lack of Transportation (Non-Medical): No  Physical Activity: Not on file  Stress: Not on file  Social Connections: Moderately Integrated (05/09/2023)   Social Connection and Isolation Panel [NHANES]    Frequency of Communication with Friends and Family: More than three times a week    Frequency of Social Gatherings with Friends and Family: Three times a week    Attends Religious Services: More than 4 times per year    Active Member of Clubs or Organizations: Yes    Attends Banker Meetings: Never    Marital Status: Divorced    Current Medications:  Current Outpatient Medications:    albuterol (VENTOLIN HFA) 108 (90 Base) MCG/ACT inhaler, Inhale 2 puffs into the lungs every 4 (four) hours as needed for wheezing or shortness of breath., Disp: , Rfl:    amLODipine (NORVASC) 10 MG tablet, Take 1 tablet (10 mg total) by mouth daily., Disp: 90 tablet, Rfl: 1   aspirin (ASPIRIN ADULT) 325 MG tablet, enteric coated; may use OTC (Patient taking differently: Take 325 mg by mouth daily. enteric coated; may use OTC), Disp: 30 tablet, Rfl: 0   atorvastatin (LIPITOR) 40 MG tablet, Take 1 tablet (40 mg total) by mouth daily., Disp: 90 tablet, Rfl: 1   bisacodyl (DULCOLAX) 5 MG EC tablet, Take 2 tablets (10 mg total) by mouth daily as needed for moderate constipation., Disp: , Rfl:    Cholecalciferol (VITAMIN D3) 50 MCG (2000 UT) capsule, Take 1 capsule (2,000 Units total) by mouth daily., Disp: 90 capsule, Rfl: 1   dexamethasone (DECADRON) 4 MG tablet, Take 2 tabs at the night before and 2 tab the morning of chemotherapy, every 3 weeks, by mouth x 6 cycles, Disp: 24 tablet, Rfl: 6   levothyroxine (SYNTHROID) 50 MCG tablet, Take 1 tablet (50 mcg total) by mouth daily., Disp: 90 tablet, Rfl: 3   lidocaine-prilocaine (EMLA) cream, Apply to affected area once, Disp: 30 g, Rfl: 3   losartan (COZAAR) 25 MG tablet, Take  25 mg by mouth daily., Disp: , Rfl:    Omega-3 Fatty Acids (FISH OIL PO), Take 1,000 mg by mouth daily., Disp: , Rfl:    ondansetron (ZOFRAN) 8 MG tablet, Take 1 tablet (8 mg total) by mouth every 8 (eight) hours as needed for nausea or vomiting. Start on the third day after carboplatin., Disp: 30 tablet, Rfl: 1   polyethylene glycol (MIRALAX / GLYCOLAX) 17 g packet, Take 34 g by mouth daily., Disp: , Rfl:    primidone (MYSOLINE) 50 MG tablet, Take 2 tablets (100 mg total) by mouth every morning., Disp: 180 tablet, Rfl: 2   prochlorperazine (COMPAZINE) 10 MG tablet, Take 1 tablet (10 mg total) by mouth every 6 (six) hours as needed for nausea or vomiting., Disp: 30 tablet, Rfl: 1  propranolol (INDERAL) 40 MG tablet, Take 0.5 tablets (20 mg total) by mouth 2 (two) times daily. (Patient taking differently: Take 20 mg by mouth daily.), Disp: 90 tablet, Rfl: 1   senna-docusate (SENOKOT-S) 8.6-50 MG tablet, Take 1 tablet by mouth 2 (two) times daily., Disp: , Rfl:    TRELEGY ELLIPTA 100-62.5-25 MCG/ACT AEPB, Inhale 1 puff into the lungs daily., Disp: , Rfl:    VOLTAREN 1 % GEL, APPLY TOPICALLY 4 TIMES DAILY AS NEEDED, Disp: 100 g, Rfl: 0  Review of Symptoms: Complete 10-system review is negative except as above in Interval History.  Physical Exam: BP (!) 115/50 (BP Location: Right Arm, Patient Position: Sitting)   Pulse 69   Temp 97.6 F (36.4 C) (Oral)   Resp 16   Wt 132 lb (59.9 kg)   LMP 01/24/2000   SpO2 100%   BMI 19.49 kg/m  General: Alert, oriented, no acute distress. HEENT: Normocephalic, atraumatic. Neck symmetric without masses. Chest: Normal work of breathing. Clear to auscultation bilaterally.  Cardiovascular: Regular rate and rhythm, no murmurs. Abdomen: Soft, nontender.   Extremities: Grossly normal range of motion.  Warm, well perfused. Skin: No rashes or lesions noted.   Laboratory & Radiologic Studies: ***

## 2023-06-04 NOTE — Patient Instructions (Signed)
It was a pleasure to see you in clinic today. - You will get imaging after 3 cycles of chemotherapy - If showing a good response, then we will likely plan for you to get surgery after your 4th cycle of chemo and have you come see me some time before that to review your surgery plan.   Thank you very much for allowing me to provide care for you today.  I appreciate your confidence in choosing our Gynecologic Oncology team at [facility].  If you have any questions about your visit today please call our office or send [place] a MyChart message and we will get back to you as soon as possible.

## 2023-06-04 NOTE — Telephone Encounter (Signed)
Effectiveness of Out-of-Pocket Psychologist, forensic ([employer]) in Cancer Patients  Called Ms Viviano to remind her to logon and complete initial questionnaires. She verbalized understanding and stated she would do so.  Margret Chance Araiya Tilmon, RN, BSN, [facility]  Her  Hers Clinical Research Nurse [facility] Direct Dial [phone] 06/04/2023 12:57 PM

## 2023-06-06 ENCOUNTER — Encounter: Payer: Self-pay | Admitting: Hematology and Oncology

## 2023-06-10 ENCOUNTER — Encounter: Payer: Self-pay | Admitting: Psychiatry

## 2023-06-10 ENCOUNTER — Encounter: Payer: Self-pay | Admitting: Hematology and Oncology

## 2023-06-12 ENCOUNTER — Other Ambulatory Visit: Payer: Self-pay

## 2023-06-13 MED FILL — Fosaprepitant Dimeglumine For IV Infusion 150 MG (Base Eq): INTRAVENOUS | Qty: 5 | Status: AC

## 2023-06-14 ENCOUNTER — Encounter: Payer: Self-pay | Admitting: Hematology and Oncology

## 2023-06-14 ENCOUNTER — Inpatient Hospital Stay (HOSPITAL_BASED_OUTPATIENT_CLINIC_OR_DEPARTMENT_OTHER): Payer: Medicare HMO | Admitting: Hematology and Oncology

## 2023-06-14 ENCOUNTER — Other Ambulatory Visit: Payer: Medicare HMO

## 2023-06-14 ENCOUNTER — Inpatient Hospital Stay: Payer: Medicare HMO | Admitting: Dietician

## 2023-06-14 ENCOUNTER — Inpatient Hospital Stay: Payer: Medicare HMO

## 2023-06-14 ENCOUNTER — Encounter: Payer: Self-pay | Admitting: *Deleted

## 2023-06-14 VITALS — BP 139/61 | HR 66 | Temp 97.5°F | Resp 18 | Ht 69.0 in | Wt 138.0 lb

## 2023-06-14 DIAGNOSIS — C562 Malignant neoplasm of left ovary: Secondary | ICD-10-CM

## 2023-06-14 DIAGNOSIS — C50311 Malignant neoplasm of lower-inner quadrant of right female breast: Secondary | ICD-10-CM

## 2023-06-14 DIAGNOSIS — D6481 Anemia due to antineoplastic chemotherapy: Secondary | ICD-10-CM | POA: Diagnosis not present

## 2023-06-14 DIAGNOSIS — T451X5A Adverse effect of antineoplastic and immunosuppressive drugs, initial encounter: Secondary | ICD-10-CM

## 2023-06-14 DIAGNOSIS — Z79811 Long term (current) use of aromatase inhibitors: Secondary | ICD-10-CM

## 2023-06-14 LAB — CMP (CANCER CENTER ONLY)
ALT: 29 U/L (ref 0–44)
AST: 24 U/L (ref 15–41)
Albumin: 3.7 g/dL (ref 3.5–5.0)
Alkaline Phosphatase: 68 U/L (ref 38–126)
Anion gap: 7 (ref 5–15)
BUN: 16 mg/dL (ref 8–23)
CO2: 25 mmol/L (ref 22–32)
Calcium: 9 mg/dL (ref 8.9–10.3)
Chloride: 107 mmol/L (ref 98–111)
Creatinine: 0.7 mg/dL (ref 0.44–1.00)
GFR, Estimated: >60 mL/min (ref >60)
Glucose, Bld: 170 mg/dL — ABNORMAL HIGH (ref 70–99)
Potassium: 4.4 mmol/L (ref 3.5–5.1)
Sodium: 139 mmol/L (ref 135–145)
Total Bilirubin: 0.3 mg/dL (ref 0.0–1.2)
Total Protein: 7.1 g/dL (ref 6.5–8.1)

## 2023-06-14 LAB — CBC WITH DIFFERENTIAL (CANCER CENTER ONLY)
Abs Immature Granulocytes: 0.03 10*3/uL (ref 0.00–0.07)
Basophils Absolute: 0 10*3/uL (ref 0.0–0.1)
Basophils Relative: 0 %
Eosinophils Absolute: 0 10*3/uL (ref 0.0–0.5)
Eosinophils Relative: 0 %
HCT: 28.1 % — ABNORMAL LOW (ref 36.0–46.0)
Hemoglobin: 9.3 g/dL — ABNORMAL LOW (ref 12.0–15.0)
Immature Granulocytes: 1 %
Lymphocytes Relative: 21 %
Lymphs Abs: 0.9 10*3/uL (ref 0.7–4.0)
MCH: 23.4 pg — ABNORMAL LOW (ref 26.0–34.0)
MCHC: 33.1 g/dL (ref 30.0–36.0)
MCV: 70.8 fL — ABNORMAL LOW (ref 80.0–100.0)
Monocytes Absolute: 0.2 10*3/uL (ref 0.1–1.0)
Monocytes Relative: 5 %
Neutro Abs: 3.3 10*3/uL (ref 1.7–7.7)
Neutrophils Relative %: 73 %
Platelet Count: 178 10*3/uL (ref 150–400)
RBC: 3.97 MIL/uL (ref 3.87–5.11)
RDW: 16.1 % — ABNORMAL HIGH (ref 11.5–15.5)
WBC Count: 4.5 10*3/uL (ref 4.0–10.5)
nRBC: 0 % (ref 0.0–0.2)

## 2023-06-14 MED ORDER — FAMOTIDINE IN NACL 20-0.9 MG/50ML-% IV SOLN
20.0000 mg | Freq: Once | INTRAVENOUS | Status: AC
  Administered 2023-06-14: 20 mg via INTRAVENOUS
  Filled 2023-06-14: qty 50

## 2023-06-14 MED ORDER — CETIRIZINE HCL 10 MG/ML IV SOLN
10.0000 mg | Freq: Once | INTRAVENOUS | Status: AC
  Administered 2023-06-14: 10 mg via INTRAVENOUS
  Filled 2023-06-14: qty 1

## 2023-06-14 MED ORDER — SODIUM CHLORIDE 0.9 % IV SOLN
420.6000 mg | Freq: Once | INTRAVENOUS | Status: AC
Start: 2023-06-14 — End: 2023-06-14
  Administered 2023-06-14: 420 mg via INTRAVENOUS
  Filled 2023-06-14: qty 42

## 2023-06-14 MED ORDER — SODIUM CHLORIDE 0.9% FLUSH
10.0000 mL | Freq: Once | INTRAVENOUS | Status: AC
  Administered 2023-06-14: 10 mL

## 2023-06-14 MED ORDER — SODIUM CHLORIDE 0.9 % IV SOLN: INTRAVENOUS | Status: DC

## 2023-06-14 MED ORDER — PALONOSETRON HCL INJECTION 0.25 MG/5ML
0.2500 mg | Freq: Once | INTRAVENOUS | Status: AC
  Administered 2023-06-14: 0.25 mg via INTRAVENOUS
  Filled 2023-06-14: qty 5

## 2023-06-14 MED ORDER — SODIUM CHLORIDE 0.9 % IV SOLN
150.0000 mg | Freq: Once | INTRAVENOUS | Status: AC
  Administered 2023-06-14: 150 mg via INTRAVENOUS
  Filled 2023-06-14: qty 150

## 2023-06-14 MED ORDER — SODIUM CHLORIDE 0.9 % IV SOLN
173.0000 mg/m2 | Freq: Once | INTRAVENOUS | Status: AC
  Administered 2023-06-14: 300 mg via INTRAVENOUS
  Filled 2023-06-14: qty 50

## 2023-06-14 MED ORDER — DEXAMETHASONE SODIUM PHOSPHATE 10 MG/ML IJ SOLN
10.0000 mg | Freq: Once | INTRAMUSCULAR | Status: AC
  Administered 2023-06-14: 10 mg via INTRAVENOUS
  Filled 2023-06-14: qty 1

## 2023-06-14 NOTE — Patient Instructions (Signed)
[facility] - A DEPT OF [facility]  Discharge Instructions: Thank you for choosing [facility] to provide your oncology and hematology care.   If you have a lab appointment with the Cancer Center, please go directly to the Cancer Center and check in at the registration area.   Wear comfortable clothing and clothing appropriate for easy access to any Portacath or PICC line.   We strive to give you quality time with your provider. You may need to reschedule your appointment if you arrive late (15 or more minutes).  Arriving late affects you and other patients whose appointments are after yours.  Also, if you miss three or more appointments without notifying the office, you may be dismissed from the clinic at the provider's discretion.      For prescription refill requests, have your pharmacy contact our office and allow 72 hours for refills to be completed.    Today you received the following chemotherapy and/or immunotherapy agents paclitaxel, carboplatin      To help prevent nausea and vomiting after your treatment, we encourage you to take your nausea medication as directed.  BELOW ARE SYMPTOMS THAT SHOULD BE REPORTED IMMEDIATELY: *FEVER GREATER THAN 100.4 F (38 C) OR HIGHER *CHILLS OR SWEATING *NAUSEA AND VOMITING THAT IS NOT CONTROLLED WITH YOUR NAUSEA MEDICATION *UNUSUAL SHORTNESS OF BREATH *UNUSUAL BRUISING OR BLEEDING *URINARY PROBLEMS (pain or burning when urinating, or frequent urination) *BOWEL PROBLEMS (unusual diarrhea, constipation, pain near the anus) TENDERNESS IN MOUTH AND THROAT WITH OR WITHOUT PRESENCE OF ULCERS (sore throat, sores in mouth, or a toothache) UNUSUAL RASH, SWELLING OR PAIN  UNUSUAL VAGINAL DISCHARGE OR ITCHING   Items with * indicate a potential emergency and should be followed up as soon as possible or go to the Emergency Department if any problems should occur.  Please show the CHEMOTHERAPY ALERT CARD or  IMMUNOTHERAPY ALERT CARD at check-in to the Emergency Department and triage nurse.  Should you have questions after your visit or need to cancel or reschedule your appointment, please contact [facility] - A DEPT OF [facility]  Dept: [phone]  and follow the prompts.  Office hours are 8:00 a.m. to 4:30 p.m. Monday - Friday. Please note that voicemails left after 4:00 p.m. may not be returned until the following business day.  We are closed weekends and major holidays. You have access to a nurse at all times for urgent questions. Please call the main number to the clinic Dept: [phone] and follow the prompts.   For any non-urgent questions, you may also contact your provider using MyChart. We now offer e-Visits for anyone 44 and older to request care online for non-urgent symptoms. For details visit [email or web address].   Also download the MyChart app! Go to the app store, search "MyChart", open the app, select [facility], and log in with your MyChart username and password.

## 2023-06-14 NOTE — Progress Notes (Signed)
Nutrition Assessment   Reason for Assessment: +MST   ASSESSMENT: 78 year old female with stage III ovarian cancer. She is receiving neoadjuvant chemotherapy with carboplatin + paclitaxel q21d. Patient is under the care of Dr. Bertis Ruddy  Past medical history includes COPD, reactive airway disease, HTN, HLD, essential tremor, CKD3, DJD, vit D deficiency, breast cancer (2020) s/p resection/XRT (anastrozole d/c under treatment per MD)  Met with patient and sister in infusion. Patient reports good appetite and eating well at this time. She is tolerating regular diet, eating 3 good meals with support of family and neighbors. Recalls sausage, biscuit with butter/jelly, apples for breakfast. Pintos with cornbread for lunch. Had 2 helpings of chicken pot pie for dinner. Patient reports nausea with vomiting starting 2 days after first chemo, lasting 3-4 days. She did take compazine which was helpful. She had diarrhea for a couple days after resolve of nausea. Patient feels this was related to no BM x7 days. Reports she will take imodium if diarrhea returns per Dr. Bertis Ruddy. Patient does not like taste of water. Drinking 1-2 bottles at most. Sister has purchased flavoring drops, however pt has not tried this. Pt would prefer to have Pedialyte. Patient does drink protein shake, but not regularly.    Nutrition Focused Physical Exam:   Orbital Region: severe Buccal Region: moderate Temple Region: moderate Clavicle Bone Region: severe Dorsal Hand: moderate Eyes: reviewed Mouth: poor dentition; missing teeth Skin: reviewed  Nails: uta - painted    Medications: amlodipine, lipitor, dulcolax, D3, losartan, zofran, miralax, compazine, propranolol, primidone   Labs: glucose 170, Hgb 9.3   Anthropometrics: Weights trending up last 4 weeks. Per chart, pt has lost 13% (20 lbs) in the last 12 months. This is insignificant for time frame, however concerning given advanced age and medical history   Height:  5'9" Weight: 138 lb  UBW: 158 lb (05/10/22) BMI: 20.38   NUTRITION DIAGNOSIS: Unintended wt loss related to cancer as evidenced by 13% wt loss in 12 months    MALNUTRITION DIAGNOSIS: Pt meets criteria for malnutrition in the context of chronic disease as evidenced by moderate/severe fat and muscle depletion    INTERVENTION:  Educated on importance of adequate calorie and protein energy intake to maintain weights/strength Encourage small frequent meals (5-6/day), recommend protein source with every meal - snack ideas + soft moist high protein foods provided  Educated on strategies for taste changes, suggested trying baking soda/salt water rinses prior to po - handout with tips + recipe provided Educated on strategies for N/V/D - foods best tolerated and foods to avoid - handout provided Recommend daily Ensure Complete/equivalent - samples + coupons   MONITORING, EVALUATION, GOAL: Pt will tolerate increased calories and protein to minimize further wt loss    Next Visit: Thursday March 27 during infusion

## 2023-06-14 NOTE — Assessment & Plan Note (Signed)
This is likely due to recent treatment. The patient denies recent history of bleeding such as epistaxis, hematuria or hematochezia. She is asymptomatic from the anemia. I will observe for now.  She does not require transfusion now. I will continue the chemotherapy at current dose without dosage adjustment.  If the anemia gets progressive worse in the future, I might have to delay her treatment or adjust the chemotherapy dose.

## 2023-06-14 NOTE — Assessment & Plan Note (Signed)
She tolerated recent treatment well She is starting to do better and gaining weight She denies side effects from treatment We will proceed with treatment without delay Plan to repeat imaging study after cycle 3

## 2023-06-14 NOTE — Research (Signed)
Effectiveness of Out-of-Pocket Psychologist, forensic ([employer]) in Cancer Patients   The research nurse along with Cooper Render, research coordinator, met with the pt during her clinic visit and reminded the pt that she needs to complete her questionnaires for the above clinical trial.  The pt was offered the opportunity to complete her questionnaires on paper during her clinic visit today.  The pt was informed that the questionnaires must be completed by next week.  The pt agreed to complete the questionnaires on paper today.  The nurse thanked the pt for her support of this study.  Melanie Henslee, research nurse, will follow up with the pt in the infusion room regarding the completion of her study questionnaires.   Janan Ridge RN, BSN, CCRP Clinical Research Nurse Lead 06/14/2023 11:33 AM

## 2023-06-14 NOTE — Progress Notes (Signed)
 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 The patient denies recent history of bleeding such as epistaxis, hematuria or hematochezia. She is asymptomatic from the anemia. I will observe for now.  She does not require transfusion now. I will continue the chemotherapy at current dose without dosage adjustment.  If the anemia gets progressive worse in the future, I might have to delay her treatment or adjust the chemotherapy dose.   No orders of the defined types were placed in this encounter.   All questions were answered. The patient knows to call the clinic with any problems, questions or concerns. The total time spent in the appointment was 20 minutes encounter with patients including review of chart and various tests results, discussions about plan of care and coordination of care plan   [name], MD 06/14/2023 12:58 PM  INTERVAL HISTORY: Please see below for problem oriented charting. she returns for chemo follow-up She is seen prior to cycle 2 of treatment With cycle 1, she have some nausea and diarrhea but they have resolved She is eating well and gaining weight Denies peripheral neuropathy  REVIEW OF SYSTEMS:   Constitutional: Denies fevers,  chills or abnormal weight loss Eyes: Denies blurriness of vision Ears, nose, mouth, throat, and face: Denies mucositis or sore throat Respiratory: Denies cough, dyspnea or wheezes Cardiovascular: Denies palpitation, chest discomfort or lower extremity swelling Skin: Denies abnormal skin rashes Lymphatics: Denies new lymphadenopathy or easy bruising Neurological:Denies numbness, tingling or new weaknesses Behavioral/Psych: Mood is stable, no new changes  All other systems were reviewed with the patient and are negative.  I have reviewed the past medical history, past surgical history, social history and family history with the patient and they are unchanged from previous note.  ALLERGIES:  is allergic to morphine and codeine, other, and percocet [oxycodone-acetaminophen].  MEDICATIONS:  Current Outpatient Medications  Medication Sig Dispense Refill   albuterol (VENTOLIN HFA) 108 (90 Base) MCG/ACT inhaler Inhale 2 puffs into the lungs every 4 (four) hours as needed for wheezing or shortness of breath.     amLODipine (NORVASC) 10 MG tablet Take 1 tablet (10 mg total) by mouth daily. 90 tablet 1   aspirin (ASPIRIN ADULT) 325 MG tablet enteric coated; may use OTC (Patient taking differently: Take 325 mg by mouth daily. enteric coated; may use OTC) 30 tablet 0   atorvastatin (LIPITOR) 40 MG tablet Take 1 tablet (40 mg total) by mouth daily. 90 tablet 1   bisacodyl (DULCOLAX) 5 MG EC tablet Take 2 tablets (10 mg total) by mouth daily as needed for moderate constipation.     Cholecalciferol (VITAMIN D3) 50 MCG (2000 UT) capsule Take 1 capsule (2,000 Units total) by mouth daily. 90 capsule 1   dexamethasone (DECADRON) 4 MG tablet Take 2 tabs at the night before and 2 tab the morning of chemotherapy,  every 3 weeks, by mouth x 6 cycles 24 tablet 6   levothyroxine (SYNTHROID) 50 MCG tablet Take 1 tablet (50 mcg total) by mouth daily. 90 tablet 3   lidocaine-prilocaine (EMLA) cream Apply to affected  area once 30 g 3   losartan (COZAAR) 25 MG tablet Take 25 mg by mouth daily.     Omega-3 Fatty Acids (FISH OIL PO) Take 1,000 mg by mouth daily.     ondansetron (ZOFRAN) 8 MG tablet Take 1 tablet (8 mg total) by mouth every 8 (eight) hours as needed for nausea or vomiting. Start on the third day after carboplatin. 30 tablet 1   polyethylene glycol (MIRALAX / GLYCOLAX) 17 g packet Take 34 g by mouth daily.     primidone (MYSOLINE) 50 MG tablet Take 2 tablets (100 mg total) by mouth every morning. 180 tablet 2   prochlorperazine (COMPAZINE) 10 MG tablet Take 1 tablet (10 mg total) by mouth every 6 (six) hours as needed for nausea or vomiting. 30 tablet 1   propranolol (INDERAL) 40 MG tablet Take 0.5 tablets (20 mg total) by mouth 2 (two) times daily. (Patient taking differently: Take 20 mg by mouth daily.) 90 tablet 1   senna-docusate (SENOKOT-S) 8.6-50 MG tablet Take 1 tablet by mouth 2 (two) times daily.     TRELEGY ELLIPTA 100-62.5-25 MCG/ACT AEPB Inhale 1 puff into the lungs daily.     VOLTAREN 1 % GEL APPLY TOPICALLY 4 TIMES DAILY AS NEEDED 100 g 0   No current facility-administered medications for this visit.   Facility-Administered Medications Ordered in Other Visits  Medication Dose Route Frequency Provider Last Rate Last Admin   0.9 %  sodium chloride infusion   Intravenous Continuous Bertis Ruddy, Brinna Divelbiss, MD 10 mL/hr at 06/14/23 1125 New Bag at 06/14/23 1125   CARBOplatin (PARAPLATIN) 420 mg in sodium chloride 0.9 % 250 mL chemo infusion  420 mg Intravenous Once Bertis Ruddy, Eliz Nigg, MD       PACLitaxel (TAXOL) 300 mg in sodium chloride 0.9 % 250 mL chemo infusion (> 80mg /m2)  173 mg/m2 (Treatment Plan Recorded) Intravenous Once [name], MD 100 mL/hr at 06/14/23 1257 300 mg at 06/14/23 1257    SUMMARY OF ONCOLOGIC HISTORY: Oncology History  Malignant neoplasm of lower-inner quadrant of right breast of female, estrogen receptor positive (HCC)  03/18/2018 Initial Diagnosis   Screening mammography on  02/27/2019 revealed a right breast abnormality, Right breast lower inner quadrant biopsy for a clinical T2N0, [name], grade 2 or 3, estrogen and progesterone receptor positive, [name], with an MIB-1 of 5%   05/07/2018 Cancer Staging   Staging form: Breast, AJCC 8th Edition - Pathologic stage from 05/07/2018: Stage IB (pT2, pN55mi, cM0, G2, ER+, PR+, HER2-) - Signed by Loa Socks, NP on 04/07/2019   05/07/2018 Surgery   pT2 pN1, [name], grade 2, with a positive inferior margin.   05/07/2018 Oncotype testing   MammaPrint obtained from the definitive surgical sample read as "low risk" predicting a disease-free survival at 5 years of 97.8% without need of chemotherapy.   05/18/2018 Genetic Testing   The Common Hereditary Cancers Panel + Thyroid Caner panel was ordered (55 genes): APC, [name], [name], [name], [name], [name], [name], [name], [name], [name], [name], [name], [name], [name], [name], [name], [name], [name], [name], [name], [name], KIT, MEN1, [name], [name], [name], [name], [name], [name], [name], [name], [name], [name], [name], [name], [name], POLE, [name], [name], RAD50, [name], [name], [name], [name],  [name], [name], [name], [name], [name], [name], [name], [name], [name], [name], [name], VHL.  Results: No pathogenic variants identified.  2 variants of uncertain significance in the genes [name] c.1176C>T (Silent) and [name] c.1419+4C>T (Intronic) were identified.  The date of this test report is 05/18/2018.   UPDATE: The [name] c.1419+4C>T (Intronic) VUS has been reclassified to "Likely Benign." The report date is 08/08/2018. UPDATE: The [name] c.1176C>T (Silent) VUS has been reclassified to "Likely Benign." The report date is 09/20/2022.    06/05/2018 Surgery   ([id number]) Reexcision of the right breast; all margins negative.   07/11/2018 - 08/27/2018 Radiation Therapy   The patient initially received a dose of 50.4 Gy in 28 fractions to the breast  using whole-breast tangent fields with an additional 45 Gy in 25 fractions to the right axilla. This was delivered using a 3-D conformal technique. The pt received a boost delivering an additional 12 Gy in 6 fractions using a electron boost with electrons. The total dose was 107.4 Gy.    09/2018 - 09/2023 Anti-estrogen oral therapy   anastrozole   Left ovarian epithelial cancer (HCC)  05/08/2023 Initial Diagnosis   Left ovarian epithelial cancer (HCC)   05/08/2023 Imaging   IR IMAGING GUIDED PORT INSERTION Result Date: 05/10/2023 INDICATION: Concern for metastatic ovarian cancer. Please perform image guided biopsy of peritoneal mass for tissue diagnostic purposes. Please perform image guided Port a catheter placement for durable intravenous access for impending chemotherapy. EXAM: 1. IMPLANTED PORT A CATH PLACEMENT WITH ULTRASOUND AND FLUOROSCOPIC GUIDANCE 2. ULTRASOUND-GUIDED LEFT VENTRAL LATERAL PERITONEAL ABDOMINAL MASS BIOPSY COMPARISON:  Chest CT-05/09/2023 CT abdomen and pelvis-05/08/2023 MEDICATIONS: None ANESTHESIA/SEDATION: Moderate (conscious) sedation was employed during this procedure as administered by the Interventional Radiology RN. A total of Versed 3 mg and Fentanyl 125 mcg was administered intravenously. Moderate Sedation Time: 46 minutes. The patient's level of consciousness and vital signs were monitored continuously by radiology nursing throughout the procedure under my direct supervision. CONTRAST:  None FLUOROSCOPY TIME:  42 seconds (4 mGy) COMPLICATIONS: None immediate. PROCEDURE: The procedure, risks, benefits, and alternatives were explained to the patient. Questions regarding the procedure were encouraged and answered. The patient understands and consents to the procedure. The right neck and chest were prepped with chlorhexidine in a sterile fashion, and a sterile drape was applied covering the operative field. Maximum barrier sterile technique with sterile gowns and gloves were  used for the procedure. A timeout was performed prior to the initiation of the procedure. Local anesthesia was provided with 1% lidocaine with epinephrine. After creating a small venotomy incision, a micropuncture kit was utilized to access the internal jugular vein. Real-time ultrasound guidance was utilized for vascular access including the acquisition of a permanent ultrasound image documenting patency of the accessed vessel. The microwire was utilized to measure appropriate catheter length. A subcutaneous port pocket was then created along the upper chest wall utilizing a combination of sharp and blunt dissection. The pocket was irrigated with sterile saline. A single lumen Slim sized power injectable port was chosen for placement. The 8 Fr catheter was tunneled from the port pocket site to the venotomy incision. The port was placed in the pocket. The external catheter was trimmed to appropriate length. At the venotomy, an 8 Fr peel-away sheath was placed over a guidewire under fluoroscopic guidance. The catheter was then placed through the sheath and the sheath was removed. Final catheter positioning was confirmed and documented with a fluoroscopic spot radiograph. The port was accessed with a Demetrios Isaacs needle,  aspirated and flushed with heparinized saline. The venotomy site was closed with an interrupted 4-0 Vicryl suture. The port pocket incision was closed with interrupted 2-0 Vicryl suture. Dermabond and Steri-strips were applied to both incisions. Dressings were applied. Attention was now paid towards image guided biopsy of dominant peritoneal mass. Sonographic evaluation performed of the ventral aspect of the left mid abdomen demonstrating an at least 8.3 x 2.1 cm hypoechoic macrolobulated mass (image 1), correlating with the mass seen on preceding abdominal CT image 50, series 3. The procedure was planned. The skin overlying the operative site was prepped and draped in usual sterile fashion. After the  overlying soft tissues were anesthetized with 1% lidocaine with epinephrine, a 17 gauge coaxial needle was advanced into the peripheral aspect of the mass. Next, under direct ultrasound guidance, 6 core needle biopsies were obtained yielding the return of adequate tissue. Samples were submitted to the laboratory for pathologic analysis. The coaxial needle was removed following the administration of a Gel-Foam slurry and superficial hemostasis achieved with manual compression. Postprocedural imaging was negative for complication, specifically, no significant Peri biopsy hematoma. A dressing was applied. The patient tolerated the above procedures well without immediate post procedural complication. FINDINGS: After catheter placement, the tip lies within the superior cavoatrial junction. The catheter aspirates and flushes normally and is ready for immediate use. Sonographic evaluation of the left mid abdomen demonstrates an at least 8.3 x 2.1 cm hypoechoic macrolobulated mass, correlating with the mass seen on preceding abdominal CT. IMPRESSION: 1. Successful placement of a right internal jugular approach power injectable Port-A-Cath. The catheter is ready for immediate use. 2. Successful ultrasound-guided biopsy of peritoneal mass within the ventral aspect of the left mid/lateral abdomen. Electronically Signed   By: Simonne Come M.D.   On: 05/10/2023 14:59   IR US Guide Bx Asp/Drain Result Date: 05/10/2023 INDICATION: Concern for metastatic ovarian cancer. Please perform image guided biopsy of peritoneal mass for tissue diagnostic purposes. Please perform image guided Port a catheter placement for durable intravenous access for impending chemotherapy. EXAM: 1. IMPLANTED PORT A CATH PLACEMENT WITH ULTRASOUND AND FLUOROSCOPIC GUIDANCE 2. ULTRASOUND-GUIDED LEFT VENTRAL LATERAL PERITONEAL ABDOMINAL MASS BIOPSY COMPARISON:  Chest CT-05/09/2023 CT abdomen and pelvis-05/08/2023 MEDICATIONS: None ANESTHESIA/SEDATION:  Moderate (conscious) sedation was employed during this procedure as administered by the Interventional Radiology RN. A total of Versed 3 mg and Fentanyl 125 mcg was administered intravenously. Moderate Sedation Time: 46 minutes. The patient's level of consciousness and vital signs were monitored continuously by radiology nursing throughout the procedure under my direct supervision. CONTRAST:  None FLUOROSCOPY TIME:  42 seconds (4 mGy) COMPLICATIONS: None immediate. PROCEDURE: The procedure, risks, benefits, and alternatives were explained to the patient. Questions regarding the procedure were encouraged and answered. The patient understands and consents to the procedure. The right neck and chest were prepped with chlorhexidine in a sterile fashion, and a sterile drape was applied covering the operative field. Maximum barrier sterile technique with sterile gowns and gloves were used for the procedure. A timeout was performed prior to the initiation of the procedure. Local anesthesia was provided with 1% lidocaine with epinephrine. After creating a small venotomy incision, a micropuncture kit was utilized to access the internal jugular vein. Real-time ultrasound guidance was utilized for vascular access including the acquisition of a permanent ultrasound image documenting patency of the accessed vessel. The microwire was utilized to measure appropriate catheter length. A subcutaneous port pocket was then created along the upper chest wall utilizing a combination of  sharp and blunt dissection. The pocket was irrigated with sterile saline. A single lumen Slim sized power injectable port was chosen for placement. The 8 Fr catheter was tunneled from the port pocket site to the venotomy incision. The port was placed in the pocket. The external catheter was trimmed to appropriate length. At the venotomy, an 8 Fr peel-away sheath was placed over a guidewire under fluoroscopic guidance. The catheter was then placed through  the sheath and the sheath was removed. Final catheter positioning was confirmed and documented with a fluoroscopic spot radiograph. The port was accessed with a Huber needle, aspirated and flushed with heparinized saline. The venotomy site was closed with an interrupted 4-0 Vicryl suture. The port pocket incision was closed with interrupted 2-0 Vicryl suture. Dermabond and Steri-strips were applied to both incisions. Dressings were applied. Attention was now paid towards image guided biopsy of dominant peritoneal mass. Sonographic evaluation performed of the ventral aspect of the left mid abdomen demonstrating an at least 8.3 x 2.1 cm hypoechoic macrolobulated mass (image 1), correlating with the mass seen on preceding abdominal CT image 50, series 3. The procedure was planned. The skin overlying the operative site was prepped and draped in usual sterile fashion. After the overlying soft tissues were anesthetized with 1% lidocaine with epinephrine, a 17 gauge coaxial needle was advanced into the peripheral aspect of the mass. Next, under direct ultrasound guidance, 6 core needle biopsies were obtained yielding the return of adequate tissue. Samples were submitted to the laboratory for pathologic analysis. The coaxial needle was removed following the administration of a Gel-Foam slurry and superficial hemostasis achieved with manual compression. Postprocedural imaging was negative for complication, specifically, no significant Peri biopsy hematoma. A dressing was applied. The patient tolerated the above procedures well without immediate post procedural complication. FINDINGS: After catheter placement, the tip lies within the superior cavoatrial junction. The catheter aspirates and flushes normally and is ready for immediate use. Sonographic evaluation of the left mid abdomen demonstrates an at least 8.3 x 2.1 cm hypoechoic macrolobulated mass, correlating with the mass seen on preceding abdominal CT. IMPRESSION: 1.  Successful placement of a right internal jugular approach power injectable Port-A-Cath. The catheter is ready for immediate use. 2. Successful ultrasound-guided biopsy of peritoneal mass within the ventral aspect of the left mid/lateral abdomen. Electronically Signed   By: Simonne Come M.D.   On: 05/10/2023 14:59   CT Angio Chest Aorta W and/or Wo Contrast Result Date: 05/09/2023 CLINICAL DATA:  Constipation and abdominal pain. Acute aortic syndrome suspected. EXAM: CT ANGIOGRAPHY CHEST WITH CONTRAST TECHNIQUE: Multidetector CT imaging of the chest was performed using the standard protocol during bolus administration of intravenous contrast. Multiplanar CT image reconstructions and MIPs were obtained to evaluate the vascular anatomy. RADIATION DOSE REDUCTION: This exam was performed according to the departmental dose-optimization program which includes automated exposure control, adjustment of the mA and/or kV according to patient size and/or use of iterative reconstruction technique. CONTRAST:  60mL OMNIPAQUE IOHEXOL 350 MG/ML SOLN COMPARISON:  Radiograph 04/27/2023 and CT chest 05/15/2022 FINDINGS: Cardiovascular: Normal heart size. No pericardial effusion. Aortic atherosclerotic calcification. No aortic aneurysm, dissection, or penetrating atherosclerotic ulcer. Coronary artery calcification. Mediastinum/Nodes: Trachea and esophagus are unremarkable. No thoracic adenopathy. Lungs/Pleura: Centrilobular emphysema greatest in the apices. Bronchial wall thickening and mucous plugging in the lower lobes. Right lower lobe atelectasis. Clustered centrilobular micro nodules and tree-in-bud opacities in the right upper lobe. No pleural effusion or pneumothorax. Upper Abdomen: No acute abnormality. Musculoskeletal: No acute  fracture.  Thoracic spondylosis. Review of the MIP images confirms the above findings. IMPRESSION: 1. No aortic aneurysm or dissection. 2. Emphysema and chronic bronchitis. Aortic Atherosclerosis  (ICD10-I70.0) and Emphysema (ICD10-J43.9). Electronically Signed   By: Minerva Fester M.D.   On: 05/09/2023 00:15   CT ABDOMEN PELVIS W CONTRAST Result Date: 05/08/2023 CLINICAL DATA:  Left lower quadrant pain EXAM: CT ABDOMEN AND PELVIS WITH CONTRAST TECHNIQUE: Multidetector CT imaging of the abdomen and pelvis was performed using the standard protocol following bolus administration of intravenous contrast. RADIATION DOSE REDUCTION: This exam was performed according to the departmental dose-optimization program which includes automated exposure control, adjustment of the mA and/or kV according to patient size and/or use of iterative reconstruction technique. CONTRAST:  50mL OMNIPAQUE IOHEXOL 350 MG/ML SOLN COMPARISON:  CT 10/30/2015 FINDINGS: Lower chest: Lung bases demonstrate bandlike scarring within the lower lobes and right middle lobe. No acute airspace disease. Hepatobiliary: Subcentimeter hypodensities in the liver too small to further characterize. No calcified gallstone or biliary dilatation. Pancreas: Unremarkable. No pancreatic ductal dilatation or surrounding inflammatory changes. Spleen: Normal in size without focal abnormality. Adrenals/Urinary Tract: Adrenal glands show no focal abnormality. Kidneys show no hydronephrosis. Simple bilateral renal cysts for which no imaging follow-up is recommended. 2.1 cm superior pole exophytic complex cyst. Bladder partially obscured Stomach/Bowel: Stomach nonenlarged. No dilated small bowel. No acute bowel wall thickening. Diverticular disease of the left colon. Vascular/Lymphatic: Advanced aortic atherosclerosis. No aneurysm. Mildly enlarged left Peri aortic node measuring 19 mm on series 3, image 42. Reproductive: Uterus is unremarkable. Heterogenous left adnexal mass lesion measuring 6.8 x 5.8 by 7 cm concerning for neoplasm. Other: Small volume free fluid within the pelvis. Evidence for peritoneal metastatic disease. 10 mm left upper quadrant peritoneal  nodule on series 3, image 35. Right mid quadrant soft tissue mass measuring 5.7 x 2.4 cm on series 3, image 44. Left lower quadrant soft tissue mass measuring 8.1 by 2.5 by 12.1 cm. Soft tissue nodule in the right colic gutter measuring 1.7 cm on series 3, image 55. Right posterior pelvic soft tissue mass measuring 3.6 x 2.6 cm on series 3, image 76. Additional small mesenteric nodules are noted. Musculoskeletal: No acute or suspicious osseous abnormality. Left hip replacement with artifact. IMPRESSION: 1. Large heterogeneous cystic and solid left adnexal mass highly suspicious for ovarian neoplasm. 2. Positive for peritoneal metastatic disease as evidence by multiple intra-abdominal and pelvic tumor implants. Trace volume ascites in the pelvis. Several mildly enlarged retroperitoneal lymph nodes also suspicious for metastatic disease. 3. Diverticular disease of the left colon without acute inflammatory process. 4. Aortic atherosclerosis. Aortic Atherosclerosis (ICD10-I70.0). Electronically Signed   By: Jasmine Pang M.D.   On: 05/08/2023 19:53   DG Chest 2 View Result Date: 04/27/2023 CLINICAL DATA:  Cough.  Chills and congestion. EXAM: CHEST - 2 VIEW COMPARISON:  03/15/2020. FINDINGS: Redemonstration of irregular opacity overlying the right lateral costophrenic angle, which corresponds to area of scarring/atelectasis on the prior CT scan from 05/15/2022. Bilateral lungs are otherwise clear. No acute consolidation or lung collapse. Bilateral costophrenic angles are clear. Normal cardio-mediastinal silhouette. No acute osseous abnormalities. The soft tissues are within normal limits. IMPRESSION: No active cardiopulmonary disease. Electronically Signed   By: Jules Schick M.D.   On: 04/27/2023 15:46      05/09/2023 Tumor Marker   Patient's tumor was tested for the following markers: CA-125. Results of the tumor marker test revealed 137.   05/10/2023 Pathology Results   SURGICAL PATHOLOGY  CASE:  [id number]  PATIENT: Catherine Lowery  Surgical Pathology Report   Clinical History: concern for metastatic ovarian cancer (cm)   FINAL MICROSCOPIC DIAGNOSIS:   A. OMENTUM, MASS WITHIN LEFT MID ABDOMEN, NEEDLE CORE BIOPSY:  - Metastatic carcinoma with a gynecologic primary, see comment  Immunohistochemical stain show that the tumor cells are positive for CK7 and PAX8 while they are negative for CK20 and GATA3 with above interpretation.  Dr. Kenyon Ana reviewed the case and concurs with the above diagnosis.    05/14/2023 Cancer Staging   Staging form: Ovary, Fallopian Tube, and Primary Peritoneal Carcinoma, AJCC 8th Edition - Clinical stage from 05/14/2023: FIGO Stage IIIC (cT3c, cN1, cM0) - Signed by [name], MD on 05/14/2023 Stage prefix: Initial diagnosis   05/25/2023 -  Chemotherapy   Patient is on Treatment Plan : OVARIAN Carboplatin (AUC 6) + Paclitaxel (175) q21d X 6 Cycles       PHYSICAL EXAMINATION: ECOG PERFORMANCE STATUS: 0 - Asymptomatic  Vitals:   06/14/23 1039  BP: 139/61  Pulse: 66  Resp: 18  Temp: (!) 97.5 F (36.4 C)  SpO2: 100%   Filed Weights   06/14/23 1039  Weight: 138 lb (62.6 kg)    GENERAL:alert, no distress and comfortable NEURO: alert & oriented x 3 with fluent speech, no focal motor/sensory deficits  LABORATORY DATA:  I have reviewed the data as listed    Component Value Date/Time   NA 139 06/14/2023 1013   NA 139 11/22/2020 0000   K 4.4 06/14/2023 1013   CL 107 06/14/2023 1013   CO2 25 06/14/2023 1013   GLUCOSE 170 (H) 06/14/2023 1013   BUN 16 06/14/2023 1013   CREATININE 0.70 06/14/2023 1013   CREATININE 1.05 (H) 05/19/2019 1034   CALCIUM 9.0 06/14/2023 1013   PROT 7.1 06/14/2023 1013   ALBUMIN 3.7 06/14/2023 1013   AST 24 06/14/2023 1013   ALT 29 06/14/2023 1013   ALKPHOS 68 06/14/2023 1013   BILITOT 0.3 06/14/2023 1013   GFRNONAA >60 06/14/2023 1013   GFRNONAA 48 (L) 01/23/2018 0804   GFRAA >60 06/23/2019 1524   GFRAA 56  (L) 01/23/2018 0804    No results found for: "SPEP", "UPEP"  Lab Results  Component Value Date   WBC 4.5 06/14/2023   NEUTROABS 3.3 06/14/2023   HGB 9.3 (L) 06/14/2023   HCT 28.1 (L) 06/14/2023   MCV 70.8 (L) 06/14/2023   PLT 178 06/14/2023      Chemistry      Component Value Date/Time   NA 139 06/14/2023 1013   NA 139 11/22/2020 0000   K 4.4 06/14/2023 1013   CL 107 06/14/2023 1013   CO2 25 06/14/2023 1013   BUN 16 06/14/2023 1013   CREATININE 0.70 06/14/2023 1013   CREATININE 1.05 (H) 05/19/2019 1034   GLU 89 11/22/2020 0000      Component Value Date/Time   CALCIUM 9.0 06/14/2023 1013   ALKPHOS 68 06/14/2023 1013   AST 24 06/14/2023 1013   ALT 29 06/14/2023 1013   BILITOT 0.3 06/14/2023 1013

## 2023-06-15 ENCOUNTER — Encounter: Payer: Self-pay | Admitting: Hematology and Oncology

## 2023-06-15 NOTE — Research (Signed)
Effectiveness of Out-of-Pocket Psychologist, forensic ([employer]) in Cancer Patients  Picked up completed questionnaires from patient in infusion chair. She confirmed that she would prefer paper questionnaires going forward. She was thanked for her time and reminded to call our team with any questions or concerns.  Margret Chance Shirelle Tootle, RN, BSN, [facility] She  Her  Hers Clinical Research Nurse [facility] Direct Dial [phone] 06/15/2023 10:09 AM

## 2023-06-16 ENCOUNTER — Other Ambulatory Visit: Payer: Self-pay

## 2023-06-19 ENCOUNTER — Other Ambulatory Visit: Payer: Self-pay

## 2023-06-20 ENCOUNTER — Other Ambulatory Visit: Payer: Self-pay

## 2023-07-04 MED FILL — Fosaprepitant Dimeglumine For IV Infusion 150 MG (Base Eq): INTRAVENOUS | Qty: 5 | Status: AC

## 2023-07-05 ENCOUNTER — Encounter: Payer: Self-pay | Admitting: Hematology and Oncology

## 2023-07-05 ENCOUNTER — Inpatient Hospital Stay: Payer: Medicare HMO

## 2023-07-05 ENCOUNTER — Inpatient Hospital Stay: Payer: Medicare HMO | Attending: Psychiatry | Admitting: Hematology and Oncology

## 2023-07-05 VITALS — BP 118/64 | HR 64 | Temp 98.9°F | Resp 18 | Ht 69.0 in | Wt 144.8 lb

## 2023-07-05 DIAGNOSIS — D61818 Other pancytopenia: Secondary | ICD-10-CM | POA: Insufficient documentation

## 2023-07-05 DIAGNOSIS — C562 Malignant neoplasm of left ovary: Secondary | ICD-10-CM

## 2023-07-05 DIAGNOSIS — Z5111 Encounter for antineoplastic chemotherapy: Secondary | ICD-10-CM | POA: Insufficient documentation

## 2023-07-05 DIAGNOSIS — Z170 Estrogen receptor positive status [ER+]: Secondary | ICD-10-CM

## 2023-07-05 DIAGNOSIS — Z79811 Long term (current) use of aromatase inhibitors: Secondary | ICD-10-CM

## 2023-07-05 DIAGNOSIS — R978 Other abnormal tumor markers: Secondary | ICD-10-CM | POA: Insufficient documentation

## 2023-07-05 LAB — CMP (CANCER CENTER ONLY)
ALT: 23 U/L (ref 0–44)
AST: 20 U/L (ref 15–41)
Albumin: 3.7 g/dL (ref 3.5–5.0)
Alkaline Phosphatase: 67 U/L (ref 38–126)
Anion gap: 7 (ref 5–15)
BUN: 16 mg/dL (ref 8–23)
CO2: 26 mmol/L (ref 22–32)
Calcium: 8.8 mg/dL — ABNORMAL LOW (ref 8.9–10.3)
Chloride: 107 mmol/L (ref 98–111)
Creatinine: 0.78 mg/dL (ref 0.44–1.00)
GFR, Estimated: >60 mL/min (ref >60)
Glucose, Bld: 174 mg/dL — ABNORMAL HIGH (ref 70–99)
Potassium: 4.5 mmol/L (ref 3.5–5.1)
Sodium: 140 mmol/L (ref 135–145)
Total Bilirubin: 0.3 mg/dL (ref 0.0–1.2)
Total Protein: 7 g/dL (ref 6.5–8.1)

## 2023-07-05 LAB — CBC WITH DIFFERENTIAL (CANCER CENTER ONLY)
Abs Immature Granulocytes: 0.01 10*3/uL (ref 0.00–0.07)
Basophils Absolute: 0 10*3/uL (ref 0.0–0.1)
Basophils Relative: 0 %
Eosinophils Absolute: 0 10*3/uL (ref 0.0–0.5)
Eosinophils Relative: 0 %
HCT: 26.5 % — ABNORMAL LOW (ref 36.0–46.0)
Hemoglobin: 8.5 g/dL — ABNORMAL LOW (ref 12.0–15.0)
Immature Granulocytes: 0 %
Lymphocytes Relative: 18 %
Lymphs Abs: 0.4 10*3/uL — ABNORMAL LOW (ref 0.7–4.0)
MCH: 23.3 pg — ABNORMAL LOW (ref 26.0–34.0)
MCHC: 32.1 g/dL (ref 30.0–36.0)
MCV: 72.6 fL — ABNORMAL LOW (ref 80.0–100.0)
Monocytes Absolute: 0.1 10*3/uL (ref 0.1–1.0)
Monocytes Relative: 3 %
Neutro Abs: 2 10*3/uL (ref 1.7–7.7)
Neutrophils Relative %: 79 %
Platelet Count: 150 10*3/uL (ref 150–400)
RBC: 3.65 MIL/uL — ABNORMAL LOW (ref 3.87–5.11)
RDW: 18 % — ABNORMAL HIGH (ref 11.5–15.5)
WBC Count: 2.5 10*3/uL — ABNORMAL LOW (ref 4.0–10.5)
nRBC: 0 % (ref 0.0–0.2)

## 2023-07-05 MED ORDER — SODIUM CHLORIDE 0.9% FLUSH
10.0000 mL | Freq: Once | INTRAVENOUS | Status: AC
  Administered 2023-07-05: 10 mL

## 2023-07-05 MED ORDER — FAMOTIDINE IN NACL 20-0.9 MG/50ML-% IV SOLN
20.0000 mg | Freq: Once | INTRAVENOUS | Status: AC
  Administered 2023-07-05: 20 mg via INTRAVENOUS
  Filled 2023-07-05: qty 50

## 2023-07-05 MED ORDER — CETIRIZINE HCL 10 MG/ML IV SOLN
10.0000 mg | Freq: Once | INTRAVENOUS | Status: AC
  Administered 2023-07-05: 10 mg via INTRAVENOUS
  Filled 2023-07-05: qty 1

## 2023-07-05 MED ORDER — SODIUM CHLORIDE 0.9 % IV SOLN
173.0000 mg/m2 | Freq: Once | INTRAVENOUS | Status: AC
  Administered 2023-07-05: 300 mg via INTRAVENOUS
  Filled 2023-07-05: qty 50

## 2023-07-05 MED ORDER — SODIUM CHLORIDE 0.9 % IV SOLN: INTRAVENOUS | Status: DC

## 2023-07-05 MED ORDER — DEXAMETHASONE SODIUM PHOSPHATE 10 MG/ML IJ SOLN
10.0000 mg | Freq: Once | INTRAMUSCULAR | Status: AC
  Administered 2023-07-05: 10 mg via INTRAVENOUS
  Filled 2023-07-05: qty 1

## 2023-07-05 MED ORDER — SODIUM CHLORIDE 0.9 % IV SOLN
350.5000 mg | Freq: Once | INTRAVENOUS | Status: AC
  Administered 2023-07-05: 350 mg via INTRAVENOUS
  Filled 2023-07-05: qty 35

## 2023-07-05 MED ORDER — HEPARIN SOD (PORK) LOCK FLUSH 100 UNIT/ML IV SOLN
500.0000 [IU] | Freq: Once | INTRAVENOUS | Status: AC | PRN
  Administered 2023-07-05: 500 [IU]

## 2023-07-05 MED ORDER — PALONOSETRON HCL INJECTION 0.25 MG/5ML
0.2500 mg | Freq: Once | INTRAVENOUS | Status: AC
  Administered 2023-07-05: 0.25 mg via INTRAVENOUS
  Filled 2023-07-05: qty 5

## 2023-07-05 MED ORDER — SODIUM CHLORIDE 0.9 % IV SOLN
150.0000 mg | Freq: Once | INTRAVENOUS | Status: AC
  Administered 2023-07-05: 150 mg via INTRAVENOUS
  Filled 2023-07-05: qty 150

## 2023-07-05 MED ORDER — SODIUM CHLORIDE 0.9% FLUSH
10.0000 mL | INTRAVENOUS | Status: DC | PRN
  Administered 2023-07-05: 10 mL

## 2023-07-05 NOTE — Assessment & Plan Note (Addendum)
 Due to recent chemotherapy She is not symptomatic I will order dose reduction

## 2023-07-05 NOTE — Progress Notes (Signed)
 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, currently on neoadjuvant chemotherapy with combination of carboplatin and paclitaxel  She tolerated last cycle therapy very well although this is complicated by severe pancytopenia We will proceed with treatment with dose adjustment Plan to order CT imaging in 2 weeks for objective assessment of response to therapy Acquired pancytopenia (HCC) Due to recent chemotherapy She is not symptomatic I will order dose reduction  Orders Placed This Encounter  Procedures   CT ABDOMEN PELVIS W CONTRAST    Standing Status:   Future    Expected Date:   07/19/2023    Expiration Date:   07/04/2024    If indicated for the ordered procedure, I authorize the administration of contrast media per Radiology protocol:   Yes    Does the patient have a contrast media/X-ray dye allergy?:   No    Preferred imaging location?:   [facility]    If indicated for the ordered procedure, I authorize the administration of oral contrast media per Radiology protocol:   Yes     [name], MD  INTERVAL HISTORY: she returns for treatment follow-up Complications related to previous cycle of chemotherapy included pancytopenia, She denies recent nausea or constipation No peripheral neuropathy I reviewed recent test results with her She has gained some weight since last time I saw her We discussed timing of next imaging study  PHYSICAL EXAMINATION: ECOG PERFORMANCE STATUS: 1 -  Symptomatic but completely ambulatory  Vitals:   07/05/23 1115  BP: 118/64  Pulse: 64  Resp: 18  Temp: 98.9 F (37.2 C)  SpO2: 100%   Filed Weights   07/05/23 1115  Weight: 144 lb 12.8 oz (65.7 kg)    Relevant data reviewed during this visit included CBC and CMP

## 2023-07-05 NOTE — Assessment & Plan Note (Addendum)
 She has locally advanced stage IIIc ovarian cancer, currently on neoadjuvant chemotherapy with combination of carboplatin and paclitaxel  She tolerated last cycle therapy very well although this is complicated by severe pancytopenia We will proceed with treatment with dose adjustment Plan to order CT imaging in 2 weeks for objective assessment of response to therapy

## 2023-07-05 NOTE — Patient Instructions (Signed)
 [facility] - A DEPT OF [facility]  Discharge Instructions: Thank you for choosing [facility] to provide your oncology and hematology care.   If you have a lab appointment with the Cancer Center, please go directly to the Cancer Center and check in at the registration area.   Wear comfortable clothing and clothing appropriate for easy access to any Portacath or PICC line.   We strive to give you quality time with your provider. You may need to reschedule your appointment if you arrive late (15 or more minutes).  Arriving late affects you and other patients whose appointments are after yours.  Also, if you miss three or more appointments without notifying the office, you may be dismissed from the clinic at the provider's discretion.      For prescription refill requests, have your pharmacy contact our office and allow 72 hours for refills to be completed.    Today you received the following chemotherapy and/or immunotherapy agents paclitaxel, carboplatin      To help prevent nausea and vomiting after your treatment, we encourage you to take your nausea medication as directed.  BELOW ARE SYMPTOMS THAT SHOULD BE REPORTED IMMEDIATELY: *FEVER GREATER THAN 100.4 F (38 C) OR HIGHER *CHILLS OR SWEATING *NAUSEA AND VOMITING THAT IS NOT CONTROLLED WITH YOUR NAUSEA MEDICATION *UNUSUAL SHORTNESS OF BREATH *UNUSUAL BRUISING OR BLEEDING *URINARY PROBLEMS (pain or burning when urinating, or frequent urination) *BOWEL PROBLEMS (unusual diarrhea, constipation, pain near the anus) TENDERNESS IN MOUTH AND THROAT WITH OR WITHOUT PRESENCE OF ULCERS (sore throat, sores in mouth, or a toothache) UNUSUAL RASH, SWELLING OR PAIN  UNUSUAL VAGINAL DISCHARGE OR ITCHING   Items with * indicate a potential emergency and should be followed up as soon as possible or go to the Emergency Department if any problems should occur.  Please show the CHEMOTHERAPY ALERT CARD or  IMMUNOTHERAPY ALERT CARD at check-in to the Emergency Department and triage nurse.  Should you have questions after your visit or need to cancel or reschedule your appointment, please contact [facility] - A DEPT OF [facility]  Dept: [phone]  and follow the prompts.  Office hours are 8:00 a.m. to 4:30 p.m. Monday - Friday. Please note that voicemails left after 4:00 p.m. may not be returned until the following business day.  We are closed weekends and major holidays. You have access to a nurse at all times for urgent questions. Please call the main number to the clinic Dept: [phone] and follow the prompts.   For any non-urgent questions, you may also contact your provider using MyChart. We now offer e-Visits for anyone 44 and older to request care online for non-urgent symptoms. For details visit [email or web address].   Also download the MyChart app! Go to the app store, search "MyChart", open the app, select [facility], and log in with your MyChart username and password.

## 2023-07-11 ENCOUNTER — Ambulatory Visit (HOSPITAL_COMMUNITY)
Admission: RE | Admit: 2023-07-11 | Discharge: 2023-07-11 | Disposition: A | Discharge status: Home / Self Care | Source: Ambulatory Visit | Attending: Hematology and Oncology | Admitting: Hematology and Oncology

## 2023-07-11 DIAGNOSIS — C562 Malignant neoplasm of left ovary: Secondary | ICD-10-CM | POA: Diagnosis present

## 2023-07-11 MED ORDER — IOHEXOL 300 MG/ML  SOLN
100.0000 mL | Freq: Once | INTRAMUSCULAR | Status: AC | PRN
  Administered 2023-07-11: 100 mL via INTRAVENOUS

## 2023-07-23 ENCOUNTER — Other Ambulatory Visit: Payer: Self-pay | Admitting: Gynecologic Oncology

## 2023-07-23 ENCOUNTER — Telehealth: Payer: Self-pay | Admitting: Oncology

## 2023-07-23 DIAGNOSIS — C562 Malignant neoplasm of left ovary: Secondary | ICD-10-CM

## 2023-07-23 DIAGNOSIS — R978 Other abnormal tumor markers: Secondary | ICD-10-CM

## 2023-07-23 NOTE — Telephone Encounter (Signed)
 Called Catherine Lowery and advised her of the appointment with Dr. Alvester Morin on 08/13/23 at 8:45.  Also discussed the upcoming plan for surgery and that we are tentatively holding 08/21/23 as a surgery date.  Catherine Lowery verbalized understanding and agreement.

## 2023-07-24 ENCOUNTER — Other Ambulatory Visit: Payer: Self-pay

## 2023-07-25 MED FILL — Fosaprepitant Dimeglumine For IV Infusion 150 MG (Base Eq): INTRAVENOUS | Qty: 5 | Status: AC

## 2023-07-26 ENCOUNTER — Inpatient Hospital Stay: Payer: Medicare HMO

## 2023-07-26 ENCOUNTER — Encounter: Payer: Self-pay | Admitting: Hematology and Oncology

## 2023-07-26 ENCOUNTER — Inpatient Hospital Stay (HOSPITAL_BASED_OUTPATIENT_CLINIC_OR_DEPARTMENT_OTHER): Payer: Medicare HMO | Admitting: Hematology and Oncology

## 2023-07-26 ENCOUNTER — Inpatient Hospital Stay: Payer: Medicare HMO | Admitting: Dietician

## 2023-07-26 VITALS — BP 159/63 | HR 74 | Temp 98.0°F | Resp 18 | Ht 69.0 in | Wt 147.4 lb

## 2023-07-26 DIAGNOSIS — R978 Other abnormal tumor markers: Secondary | ICD-10-CM

## 2023-07-26 DIAGNOSIS — Z5111 Encounter for antineoplastic chemotherapy: Secondary | ICD-10-CM | POA: Diagnosis not present

## 2023-07-26 DIAGNOSIS — C562 Malignant neoplasm of left ovary: Secondary | ICD-10-CM

## 2023-07-26 DIAGNOSIS — D61818 Other pancytopenia: Secondary | ICD-10-CM | POA: Diagnosis not present

## 2023-07-26 DIAGNOSIS — Z170 Estrogen receptor positive status [ER+]: Secondary | ICD-10-CM

## 2023-07-26 DIAGNOSIS — Z79811 Long term (current) use of aromatase inhibitors: Secondary | ICD-10-CM

## 2023-07-26 LAB — CMP (CANCER CENTER ONLY)
ALT: 14 U/L (ref 0–44)
AST: 16 U/L (ref 15–41)
Albumin: 3.9 g/dL (ref 3.5–5.0)
Alkaline Phosphatase: 75 U/L (ref 38–126)
Anion gap: 9 (ref 5–15)
BUN: 24 mg/dL — ABNORMAL HIGH (ref 8–23)
CO2: 22 mmol/L (ref 22–32)
Calcium: 9.4 mg/dL (ref 8.9–10.3)
Chloride: 109 mmol/L (ref 98–111)
Creatinine: 0.92 mg/dL (ref 0.44–1.00)
GFR, Estimated: >60 mL/min (ref >60)
Glucose, Bld: 196 mg/dL — ABNORMAL HIGH (ref 70–99)
Potassium: 4.5 mmol/L (ref 3.5–5.1)
Sodium: 140 mmol/L (ref 135–145)
Total Bilirubin: 0.3 mg/dL (ref 0.0–1.2)
Total Protein: 7.5 g/dL (ref 6.5–8.1)

## 2023-07-26 LAB — CBC WITH DIFFERENTIAL (CANCER CENTER ONLY)
Abs Immature Granulocytes: 0.02 10*3/uL (ref 0.00–0.07)
Basophils Absolute: 0 10*3/uL (ref 0.0–0.1)
Basophils Relative: 0 %
Eosinophils Absolute: 0 10*3/uL (ref 0.0–0.5)
Eosinophils Relative: 0 %
HCT: 25.5 % — ABNORMAL LOW (ref 36.0–46.0)
Hemoglobin: 8.4 g/dL — ABNORMAL LOW (ref 12.0–15.0)
Immature Granulocytes: 1 %
Lymphocytes Relative: 14 %
Lymphs Abs: 0.6 10*3/uL — ABNORMAL LOW (ref 0.7–4.0)
MCH: 24.2 pg — ABNORMAL LOW (ref 26.0–34.0)
MCHC: 32.9 g/dL (ref 30.0–36.0)
MCV: 73.5 fL — ABNORMAL LOW (ref 80.0–100.0)
Monocytes Absolute: 0.1 10*3/uL (ref 0.1–1.0)
Monocytes Relative: 3 %
Neutro Abs: 3.4 10*3/uL (ref 1.7–7.7)
Neutrophils Relative %: 82 %
Platelet Count: 195 10*3/uL (ref 150–400)
RBC: 3.47 MIL/uL — ABNORMAL LOW (ref 3.87–5.11)
RDW: 18.9 % — ABNORMAL HIGH (ref 11.5–15.5)
WBC Count: 4.1 10*3/uL (ref 4.0–10.5)
nRBC: 0 % (ref 0.0–0.2)

## 2023-07-26 MED ORDER — DEXAMETHASONE SODIUM PHOSPHATE 10 MG/ML IJ SOLN
10.0000 mg | Freq: Once | INTRAMUSCULAR | Status: AC
  Administered 2023-07-26: 10 mg via INTRAVENOUS
  Filled 2023-07-26: qty 1

## 2023-07-26 MED ORDER — SODIUM CHLORIDE 0.9% FLUSH
10.0000 mL | Freq: Once | INTRAVENOUS | Status: AC
  Administered 2023-07-26: 10 mL

## 2023-07-26 MED ORDER — SODIUM CHLORIDE 0.9 % IV SOLN
150.0000 mg | Freq: Once | INTRAVENOUS | Status: AC
  Administered 2023-07-26: 150 mg via INTRAVENOUS
  Filled 2023-07-26: qty 150

## 2023-07-26 MED ORDER — CETIRIZINE HCL 10 MG/ML IV SOLN
10.0000 mg | Freq: Once | INTRAVENOUS | Status: AC
  Administered 2023-07-26: 10 mg via INTRAVENOUS
  Filled 2023-07-26: qty 1

## 2023-07-26 MED ORDER — PALONOSETRON HCL INJECTION 0.25 MG/5ML
0.2500 mg | Freq: Once | INTRAVENOUS | Status: AC
  Administered 2023-07-26: 0.25 mg via INTRAVENOUS
  Filled 2023-07-26: qty 5

## 2023-07-26 MED ORDER — FAMOTIDINE IN NACL 20-0.9 MG/50ML-% IV SOLN
20.0000 mg | Freq: Once | INTRAVENOUS | Status: AC
  Administered 2023-07-26: 20 mg via INTRAVENOUS
  Filled 2023-07-26: qty 50

## 2023-07-26 MED ORDER — SODIUM CHLORIDE 0.9% FLUSH
10.0000 mL | INTRAVENOUS | Status: DC | PRN
  Administered 2023-07-26: 10 mL

## 2023-07-26 MED ORDER — SODIUM CHLORIDE 0.9 % IV SOLN
366.0000 mg | Freq: Once | INTRAVENOUS | Status: AC
  Administered 2023-07-26: 370 mg via INTRAVENOUS
  Filled 2023-07-26: qty 37

## 2023-07-26 MED ORDER — HEPARIN SOD (PORK) LOCK FLUSH 100 UNIT/ML IV SOLN
500.0000 [IU] | Freq: Once | INTRAVENOUS | Status: AC | PRN
  Administered 2023-07-26: 500 [IU]

## 2023-07-26 MED ORDER — SODIUM CHLORIDE 0.9 % IV SOLN
175.0000 mg/m2 | Freq: Once | INTRAVENOUS | Status: AC
  Administered 2023-07-26: 318 mg via INTRAVENOUS
  Filled 2023-07-26: qty 53

## 2023-07-26 MED ORDER — SODIUM CHLORIDE 0.9 % IV SOLN: INTRAVENOUS | Status: DC

## 2023-07-26 NOTE — Assessment & Plan Note (Addendum)
 She has locally advanced stage IIIc ovarian cancer, currently on neoadjuvant chemotherapy with combination of carboplatin and paclitaxel  She tolerated last cycle therapy very well although this is complicated by severe pancytopenia I reviewed CT imaging from March 2025 with the patient and family which show positive response to therapy Overall, she tolerated treatment well with minor dose adjustment We will proceed with treatment as scheduled Due to pancytopenia, she will return next week for blood transfusion support

## 2023-07-26 NOTE — Assessment & Plan Note (Addendum)
 Due to recent chemotherapy She is not symptomatic We will proceed with chemotherapy today Next week, she will return for blood count check I recommend 1 unit of blood if hemoglobin is less than 8

## 2023-07-26 NOTE — Patient Instructions (Signed)
 [facility] - A DEPT OF [facility]  Discharge Instructions: Thank you for choosing [facility] to provide your oncology and hematology care.   If you have a lab appointment with the Cancer Center, please go directly to the Cancer Center and check in at the registration area.   Wear comfortable clothing and clothing appropriate for easy access to any Portacath or PICC line.   We strive to give you quality time with your provider. You may need to reschedule your appointment if you arrive late (15 or more minutes).  Arriving late affects you and other patients whose appointments are after yours.  Also, if you miss three or more appointments without notifying the office, you may be dismissed from the clinic at the provider's discretion.      For prescription refill requests, have your pharmacy contact our office and allow 72 hours for refills to be completed.    Today you received the following chemotherapy and/or immunotherapy agents paclitaxel, carboplatin      To help prevent nausea and vomiting after your treatment, we encourage you to take your nausea medication as directed.  BELOW ARE SYMPTOMS THAT SHOULD BE REPORTED IMMEDIATELY: *FEVER GREATER THAN 100.4 F (38 C) OR HIGHER *CHILLS OR SWEATING *NAUSEA AND VOMITING THAT IS NOT CONTROLLED WITH YOUR NAUSEA MEDICATION *UNUSUAL SHORTNESS OF BREATH *UNUSUAL BRUISING OR BLEEDING *URINARY PROBLEMS (pain or burning when urinating, or frequent urination) *BOWEL PROBLEMS (unusual diarrhea, constipation, pain near the anus) TENDERNESS IN MOUTH AND THROAT WITH OR WITHOUT PRESENCE OF ULCERS (sore throat, sores in mouth, or a toothache) UNUSUAL RASH, SWELLING OR PAIN  UNUSUAL VAGINAL DISCHARGE OR ITCHING   Items with * indicate a potential emergency and should be followed up as soon as possible or go to the Emergency Department if any problems should occur.  Please show the CHEMOTHERAPY ALERT CARD or  IMMUNOTHERAPY ALERT CARD at check-in to the Emergency Department and triage nurse.  Should you have questions after your visit or need to cancel or reschedule your appointment, please contact [facility] - A DEPT OF [facility]  Dept: [phone]  and follow the prompts.  Office hours are 8:00 a.m. to 4:30 p.m. Monday - Friday. Please note that voicemails left after 4:00 p.m. may not be returned until the following business day.  We are closed weekends and major holidays. You have access to a nurse at all times for urgent questions. Please call the main number to the clinic Dept: [phone] and follow the prompts.   For any non-urgent questions, you may also contact your provider using MyChart. We now offer e-Visits for anyone 44 and older to request care online for non-urgent symptoms. For details visit [email or web address].   Also download the MyChart app! Go to the app store, search "MyChart", open the app, select [facility], and log in with your MyChart username and password.

## 2023-07-26 NOTE — Progress Notes (Signed)
 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, currently on neoadjuvant chemotherapy with combination of carboplatin and paclitaxel  She tolerated last cycle therapy very well although this is complicated by severe pancytopenia I reviewed CT imaging from March 2025 with the patient and family which show positive response to therapy Overall, she tolerated treatment well with minor dose adjustment We will proceed with treatment as scheduled Due to pancytopenia, she will return next week for blood transfusion support Acquired pancytopenia (HCC) Due to recent chemotherapy She is not symptomatic We will proceed with chemotherapy today Next week, she will return for blood count check I recommend 1 unit of blood if hemoglobin is less than 8  Orders Placed This Encounter  Procedures   CBC with Differential (Cancer Center Only)    Standing Status:   Future    Expected Date:   11/01/2023    Expiration Date:   10/31/2024   CMP (Cancer Center only)    Standing Status:   Future    Expected Date:   11/01/2023    Expiration Date:   10/31/2024   CBC with Differential (Cancer Center Only)    Standing Status:   Future    Expected Date:   08/03/2023    Expiration Date:   07/25/2024   Sample to Blood Bank    Standing Status:   Standing    Number of Occurrences:   33    Expiration Date:   07/25/2024   ABO/Rh    Standing Status:   Future    Expected Date:   08/03/2023    Expiration Date:   07/25/2024     [name],  MD  INTERVAL HISTORY: she returns for treatment follow-up Complications related to previous cycle of chemotherapy included anemia, Overall, she tolerated recent treatment well She is gaining weight Denies nausea or constipation No peripheral neuropathy We reviewed test and discussed timing of surgery and future treatment  PHYSICAL EXAMINATION: ECOG PERFORMANCE STATUS: 0 - Asymptomatic  Vitals:   07/26/23 0924  BP: (!) 159/63  Pulse: 74  Resp: 18  Temp: 98 F (36.7 C)  SpO2: 100%   Filed Weights   07/26/23 0924  Weight: 147 lb 6.4 oz (66.9 kg)    Relevant data reviewed during this visit included CBC, CMP and CT imaging from March 2025

## 2023-07-26 NOTE — Progress Notes (Signed)
 Nutrition Follow-up:  Pt with stage III ovarian cancer. She is receiving neoadjuvant chemotherapy with carboplatin + paclitaxel q21d. Patient is under the care of Dr. Bertis Ruddy   Met with pt in infusion. She is doing well today. Patient has a good appetite. States it's almost too good because she eats all day. She was hoping to weigh 150 lb this visit. Yesterday had sausage egg/cheese biscuit with fruit for breakfast, baked spaghetti with garlic bread for lunch, and 2 pieces of pizza for dinner. Pt is drinking 2-3 Boost HP in between meals. She denies nausea, vomiting, diarrhea, constipation. Patient reports surgery scheduled for 4/22. She is going to work on increasing activity in preparation.    Medications: reviewed   Labs: glucose 196, BUN 24  Anthropometrics: Wt 147 lb 6.4 oz today - trending up  3/6 - 144 lb 12.8 oz  2/13 - 138 lb    NUTRITION DIAGNOSIS: Unintended wt loss improving    MALNUTRITION DIAGNOSIS: Severe malnutrition continues, however slowly improving   INTERVENTION:  Continue 3 meals + 3 Boost HP Encourage lean protein source at every meal Encourage activity as able Planned for surgery 4/22 Support and encouragement    MONITORING, EVALUATION, GOAL: wt trends, intake   NEXT VISIT: Thursday May 22 during infusion

## 2023-07-27 ENCOUNTER — Other Ambulatory Visit: Payer: Self-pay

## 2023-07-27 LAB — CANCER ANTIGEN 19-9: CA 19-9: 41 U/mL — ABNORMAL HIGH (ref 0–35)

## 2023-07-27 LAB — CA 125: Cancer Antigen (CA) 125: 14.2 U/mL (ref 0.0–38.1)

## 2023-07-29 ENCOUNTER — Other Ambulatory Visit: Payer: Self-pay

## 2023-08-01 ENCOUNTER — Other Ambulatory Visit: Payer: Self-pay

## 2023-08-03 ENCOUNTER — Other Ambulatory Visit: Payer: Self-pay | Admitting: Hematology and Oncology

## 2023-08-03 ENCOUNTER — Inpatient Hospital Stay

## 2023-08-03 ENCOUNTER — Inpatient Hospital Stay: Attending: Psychiatry

## 2023-08-03 DIAGNOSIS — Z8041 Family history of malignant neoplasm of ovary: Secondary | ICD-10-CM | POA: Diagnosis not present

## 2023-08-03 DIAGNOSIS — Z1721 Progesterone receptor positive status: Secondary | ICD-10-CM | POA: Insufficient documentation

## 2023-08-03 DIAGNOSIS — D649 Anemia, unspecified: Secondary | ICD-10-CM | POA: Diagnosis not present

## 2023-08-03 DIAGNOSIS — C562 Malignant neoplasm of left ovary: Secondary | ICD-10-CM | POA: Insufficient documentation

## 2023-08-03 DIAGNOSIS — Z807 Family history of other malignant neoplasms of lymphoid, hematopoietic and related tissues: Secondary | ICD-10-CM | POA: Insufficient documentation

## 2023-08-03 DIAGNOSIS — Z170 Estrogen receptor positive status [ER+]: Secondary | ICD-10-CM | POA: Diagnosis not present

## 2023-08-03 DIAGNOSIS — Z808 Family history of malignant neoplasm of other organs or systems: Secondary | ICD-10-CM | POA: Diagnosis not present

## 2023-08-03 DIAGNOSIS — Z8042 Family history of malignant neoplasm of prostate: Secondary | ICD-10-CM | POA: Insufficient documentation

## 2023-08-03 DIAGNOSIS — C786 Secondary malignant neoplasm of retroperitoneum and peritoneum: Secondary | ICD-10-CM | POA: Diagnosis not present

## 2023-08-03 DIAGNOSIS — F17210 Nicotine dependence, cigarettes, uncomplicated: Secondary | ICD-10-CM | POA: Insufficient documentation

## 2023-08-03 DIAGNOSIS — D6481 Anemia due to antineoplastic chemotherapy: Secondary | ICD-10-CM

## 2023-08-03 DIAGNOSIS — Z79899 Other long term (current) drug therapy: Secondary | ICD-10-CM | POA: Insufficient documentation

## 2023-08-03 DIAGNOSIS — Z79811 Long term (current) use of aromatase inhibitors: Secondary | ICD-10-CM | POA: Insufficient documentation

## 2023-08-03 DIAGNOSIS — Z1732 Human epidermal growth factor receptor 2 negative status: Secondary | ICD-10-CM | POA: Insufficient documentation

## 2023-08-03 DIAGNOSIS — Z803 Family history of malignant neoplasm of breast: Secondary | ICD-10-CM | POA: Insufficient documentation

## 2023-08-03 DIAGNOSIS — C50311 Malignant neoplasm of lower-inner quadrant of right female breast: Secondary | ICD-10-CM | POA: Insufficient documentation

## 2023-08-03 DIAGNOSIS — D61818 Other pancytopenia: Secondary | ICD-10-CM

## 2023-08-03 LAB — CBC WITH DIFFERENTIAL (CANCER CENTER ONLY)
Abs Immature Granulocytes: 0.01 10*3/uL (ref 0.00–0.07)
Basophils Absolute: 0 10*3/uL (ref 0.0–0.1)
Basophils Relative: 2 %
Eosinophils Absolute: 0.1 10*3/uL (ref 0.0–0.5)
Eosinophils Relative: 4 %
HCT: 22.8 % — ABNORMAL LOW (ref 36.0–46.0)
Hemoglobin: 7.4 g/dL — ABNORMAL LOW (ref 12.0–15.0)
Immature Granulocytes: 1 %
Lymphocytes Relative: 45 %
Lymphs Abs: 0.8 10*3/uL (ref 0.7–4.0)
MCH: 23.9 pg — ABNORMAL LOW (ref 26.0–34.0)
MCHC: 32.5 g/dL (ref 30.0–36.0)
MCV: 73.5 fL — ABNORMAL LOW (ref 80.0–100.0)
Monocytes Absolute: 0.1 10*3/uL (ref 0.1–1.0)
Monocytes Relative: 5 %
Neutro Abs: 0.8 10*3/uL — ABNORMAL LOW (ref 1.7–7.7)
Neutrophils Relative %: 43 %
Platelet Count: 109 10*3/uL — ABNORMAL LOW (ref 150–400)
RBC: 3.1 MIL/uL — ABNORMAL LOW (ref 3.87–5.11)
RDW: 17.2 % — ABNORMAL HIGH (ref 11.5–15.5)
Smear Review: NORMAL
WBC Count: 1.8 10*3/uL — ABNORMAL LOW (ref 4.0–10.5)
nRBC: 0 % (ref 0.0–0.2)

## 2023-08-03 LAB — PREPARE RBC (CROSSMATCH)

## 2023-08-03 LAB — ABO/RH: ABO/RH(D): O POS

## 2023-08-03 LAB — SAMPLE TO BLOOD BANK

## 2023-08-03 MED ORDER — HEPARIN SOD (PORK) LOCK FLUSH 100 UNIT/ML IV SOLN
500.0000 [IU] | Freq: Every day | INTRAVENOUS | Status: AC | PRN
  Administered 2023-08-03: 500 [IU]

## 2023-08-03 MED ORDER — SODIUM CHLORIDE 0.9% IV SOLUTION
250.0000 mL | INTRAVENOUS | Status: DC
Start: 2023-08-03 — End: 2023-08-03
  Administered 2023-08-03: 100 mL via INTRAVENOUS

## 2023-08-03 MED ORDER — ACETAMINOPHEN 325 MG PO TABS
650.0000 mg | ORAL_TABLET | Freq: Once | ORAL | Status: AC
  Administered 2023-08-03: 650 mg via ORAL
  Filled 2023-08-03: qty 2

## 2023-08-03 MED ORDER — SODIUM CHLORIDE 0.9% FLUSH
10.0000 mL | Freq: Once | INTRAVENOUS | Status: AC
  Administered 2023-08-03: 10 mL

## 2023-08-03 MED ORDER — SODIUM CHLORIDE 0.9% FLUSH
10.0000 mL | INTRAVENOUS | Status: AC | PRN
Start: 2023-08-03 — End: 2023-08-03
  Administered 2023-08-03: 10 mL

## 2023-08-03 NOTE — Patient Instructions (Signed)

## 2023-08-04 LAB — TYPE AND SCREEN
ABO/RH(D): O POS
Antibody Screen: NEGATIVE
Unit division: 0

## 2023-08-04 LAB — BPAM RBC
Blood Product Expiration Date: 202505022359
ISSUE DATE / TIME: 202504041031
Unit Type and Rh: 5100

## 2023-08-13 ENCOUNTER — Encounter: Payer: Self-pay | Admitting: Psychiatry

## 2023-08-13 ENCOUNTER — Inpatient Hospital Stay

## 2023-08-13 ENCOUNTER — Inpatient Hospital Stay: Admitting: Psychiatry

## 2023-08-13 ENCOUNTER — Telehealth: Payer: Self-pay | Admitting: *Deleted

## 2023-08-13 ENCOUNTER — Inpatient Hospital Stay: Admitting: Gynecologic Oncology

## 2023-08-13 VITALS — BP 135/63 | HR 67 | Temp 97.9°F | Resp 17 | Wt 146.0 lb

## 2023-08-13 DIAGNOSIS — Z7189 Other specified counseling: Secondary | ICD-10-CM | POA: Diagnosis not present

## 2023-08-13 DIAGNOSIS — D61818 Other pancytopenia: Secondary | ICD-10-CM

## 2023-08-13 DIAGNOSIS — C562 Malignant neoplasm of left ovary: Secondary | ICD-10-CM | POA: Diagnosis not present

## 2023-08-13 LAB — CBC WITH DIFFERENTIAL (CANCER CENTER ONLY)
Abs Immature Granulocytes: 0.01 10*3/uL (ref 0.00–0.07)
Basophils Absolute: 0 10*3/uL (ref 0.0–0.1)
Basophils Relative: 1 %
Eosinophils Absolute: 0.1 10*3/uL (ref 0.0–0.5)
Eosinophils Relative: 2 %
HCT: 28.6 % — ABNORMAL LOW (ref 36.0–46.0)
Hemoglobin: 9.3 g/dL — ABNORMAL LOW (ref 12.0–15.0)
Immature Granulocytes: 0 %
Lymphocytes Relative: 32 %
Lymphs Abs: 0.9 10*3/uL (ref 0.7–4.0)
MCH: 24.7 pg — ABNORMAL LOW (ref 26.0–34.0)
MCHC: 32.5 g/dL (ref 30.0–36.0)
MCV: 76.1 fL — ABNORMAL LOW (ref 80.0–100.0)
Monocytes Absolute: 0.7 10*3/uL (ref 0.1–1.0)
Monocytes Relative: 23 %
Neutro Abs: 1.2 10*3/uL — ABNORMAL LOW (ref 1.7–7.7)
Neutrophils Relative %: 42 %
Platelet Count: 122 10*3/uL — ABNORMAL LOW (ref 150–400)
RBC: 3.76 MIL/uL — ABNORMAL LOW (ref 3.87–5.11)
RDW: 19.4 % — ABNORMAL HIGH (ref 11.5–15.5)
WBC Count: 2.9 10*3/uL — ABNORMAL LOW (ref 4.0–10.5)
nRBC: 0 % (ref 0.0–0.2)

## 2023-08-13 LAB — CMP (CANCER CENTER ONLY)
ALT: 13 U/L (ref 0–44)
AST: 15 U/L (ref 15–41)
Albumin: 4 g/dL (ref 3.5–5.0)
Alkaline Phosphatase: 68 U/L (ref 38–126)
Anion gap: 5 (ref 5–15)
BUN: 17 mg/dL (ref 8–23)
CO2: 28 mmol/L (ref 22–32)
Calcium: 9.5 mg/dL (ref 8.9–10.3)
Chloride: 108 mmol/L (ref 98–111)
Creatinine: 0.79 mg/dL (ref 0.44–1.00)
GFR, Estimated: >60 mL/min (ref >60)
Glucose, Bld: 89 mg/dL (ref 70–99)
Potassium: 4.2 mmol/L (ref 3.5–5.1)
Sodium: 141 mmol/L (ref 135–145)
Total Bilirubin: 0.4 mg/dL (ref 0.0–1.2)
Total Protein: 7.4 g/dL (ref 6.5–8.1)

## 2023-08-13 MED ORDER — SENNOSIDES-DOCUSATE SODIUM 8.6-50 MG PO TABS: 2.0000 | ORAL_TABLET | Freq: Every day | ORAL | 0 refills | Status: DC

## 2023-08-13 MED ORDER — TRAMADOL HCL 50 MG PO TABS: 50.0000 mg | ORAL_TABLET | Freq: Four times a day (QID) | ORAL | 0 refills | Status: DC | PRN

## 2023-08-13 NOTE — Telephone Encounter (Signed)
 Spoke with [name] and relayed message from Catherine Grieves, NP that Dr. Daisey Dryer does not feel pt needs additional blood at this time based on labs and we will plan on rechecking this the morning of surgery again. Pt verbalized understanding and thanked the office for calling.

## 2023-08-13 NOTE — H&P (View-Only) (Signed)
 Gynecologic Oncology Return Clinic Visit  Date of Service: 08/13/2023 Referring Provider: Dr. [name]   Assessment & Plan: Catherine Lowery is a 78 y.o. 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  Reviewed her imaging following 3 cycles of chemotherapy.  This has shown good response to treatment.  Also her CA125 has normalized.  Feel that patient is a reasonable candidate to proceed with interval debulking surgery.  Patient was consented for: robotic assisted total laparoscopic hysterectomy, bilateral salpingo-oophorectomy, omentectomy, tumor debulking, possible laparotomy, possible bowel surgery on 08/21/23.  The risks of surgery were discussed in detail and she understands these to including but not limited to bleeding requiring a blood transfusion, infection, injury to adjacent organs (including but not limited to the bowels, bladder, ureters, nerves, blood vessels), thromboembolic events, wound separation, hernia, vaginal cuff separation, possible risk of lymphedema and lymphocyst if lymphadenectomy performed, unforseen complication, possible need for re-exploration, and possible permanent ostomy.  If the patient experiences any of these events, she understands that her hospitalization or recovery may be prolonged and that she may need to take additional medications for a prolonged period. The patient will receive DVT and antibiotic prophylaxis as indicated. She voiced a clear understanding. She had the opportunity to ask questions and informed consent was obtained today. She wishes to proceed.  She will proceed to the lab today for CBC, CMP. Will see if pt needs additional blood prior to surgery. Also recommend that pt start dulcolax so she is having slightly more frequent BM prior to surgery given some stool in colon on imaging. She does not require  preoperative clearance. Her METs are >4.  We will have pt change from 325mg  aspirin to 81mg . All preoperative instructions were reviewed. Postoperative expectations were also reviewed. Written handouts were provided to the patient.  RTC postop.  Clide Cliff, MD Gynecologic Oncology   Medical Decision Making I personally spent  TOTAL 40 minutes face-to-face and non-face-to-face in the care of this patient, which includes all pre, intra, and post visit time on the date of service.   ----------------------- Reason for Visit: Follow-up  Treatment History: Oncology History  Malignant neoplasm of lower-inner quadrant of right breast of female, estrogen receptor positive (HCC)  03/18/2018 Initial Diagnosis   Screening mammography on 02/27/2019 revealed a right breast abnormality, Right breast lower inner quadrant biopsy for a clinical T2N0, [name], grade 2 or 3, estrogen and progesterone receptor positive, [name], with an MIB-1 of 5%   05/07/2018 Cancer Staging   Staging form: Breast, AJCC 8th Edition - Pathologic stage from 05/07/2018: Stage IB (pT2, pN53mi, cM0, G2, ER+, PR+, HER2-) - Signed by Loa Socks, NP on 04/07/2019   05/07/2018 Surgery   pT2 pN1, [name], grade 2, with a positive inferior margin.   05/07/2018 Oncotype testing   MammaPrint obtained from the definitive surgical sample read as "low risk" predicting a disease-free survival at 5 years of 97.8% without need of chemotherapy.   05/18/2018 Genetic Testing   The Common Hereditary Cancers Panel + Thyroid Caner panel was ordered (55 genes): APC, [name], [name], [name], [name], [name], [name], [name], [name], [name], [name], [name], [name], [name], [name], [name], [name], [name], [name], [name], [name], KIT, MEN1, [name], [name], [name], [name], [name], [name], [name], [name], [name], [name], [name], [name], [name], POLE, [name], [name], RAD50, [name], [name],  [name], [name], [name], [name], [name], [name], [name], [name], [name], [name], [name], [name], [name], VHL.  Results: No pathogenic variants identified.  2 variants of uncertain significance in the genes [name] c.1176C>T (Silent) and [name] c.1419+4C>T (Intronic) were identified.  The date of this test report is 05/18/2018.   UPDATE: The [name] c.1419+4C>T (Intronic) VUS has been reclassified to "Likely Benign." The report date is 08/08/2018. UPDATE: The [name] c.1176C>T (Silent) VUS has been reclassified to "Likely Benign." The report date is 09/20/2022.    06/05/2018 Surgery   ([id number]) Reexcision of the right breast; all margins negative.   07/11/2018 - 08/27/2018 Radiation Therapy   The patient initially received a dose of 50.4 Gy in 28 fractions to the breast using whole-breast tangent fields with an additional 45 Gy in 25 fractions to the right axilla. This was delivered using a 3-D conformal technique. The pt received a boost delivering an additional 12 Gy in 6 fractions using a electron boost with electrons. The total dose was 107.4 Gy.    09/2018 - 09/2023 Anti-estrogen oral therapy   anastrozole   Left ovarian epithelial cancer (HCC)  05/08/2023 Initial Diagnosis   Left ovarian epithelial cancer (HCC)   05/08/2023 Imaging   IR IMAGING GUIDED PORT INSERTION Result Date: 05/10/2023 INDICATION: Concern for metastatic ovarian cancer. Please perform image guided biopsy of peritoneal mass for tissue diagnostic purposes. Please perform image guided Port a catheter placement for durable intravenous access for impending chemotherapy. EXAM: 1. IMPLANTED PORT A CATH PLACEMENT WITH ULTRASOUND AND FLUOROSCOPIC GUIDANCE 2. ULTRASOUND-GUIDED LEFT VENTRAL LATERAL PERITONEAL ABDOMINAL MASS BIOPSY COMPARISON:  Chest CT-05/09/2023 CT abdomen and pelvis-05/08/2023 MEDICATIONS: None ANESTHESIA/SEDATION: Moderate (conscious) sedation was employed during this procedure as administered by the Interventional Radiology RN. A total of  Versed 3 mg and Fentanyl 125 mcg was administered intravenously. Moderate Sedation Time: 46 minutes. The patient's level of consciousness and vital signs were monitored continuously by radiology nursing throughout the procedure under my direct supervision. CONTRAST:  None FLUOROSCOPY TIME:  42 seconds (4 mGy) COMPLICATIONS: None immediate. PROCEDURE: The procedure, risks, benefits, and alternatives were explained to the patient. Questions regarding the procedure were encouraged and answered. The patient understands and consents to the procedure. The right neck and chest were prepped with chlorhexidine in a sterile fashion, and a sterile drape was applied covering the operative field. Maximum barrier sterile technique with sterile gowns and gloves were used for the procedure. A timeout was performed prior to the initiation of the procedure. Local anesthesia was provided with 1% lidocaine with epinephrine. After creating a small venotomy incision, a micropuncture kit was utilized to access the internal jugular vein. Real-time ultrasound guidance was utilized for vascular access including the acquisition of a permanent ultrasound image documenting patency of the accessed vessel. The microwire was utilized to measure appropriate catheter length. A subcutaneous port pocket was then created along the upper chest wall utilizing a combination of sharp and blunt dissection. The pocket was irrigated with sterile saline. A single lumen Slim sized power injectable port was chosen for placement. The 8 Fr catheter was tunneled from the port pocket site to the venotomy incision. The port was placed in the pocket. The external catheter was trimmed to appropriate length. At the venotomy, an 8 Fr peel-away sheath was placed over a guidewire under fluoroscopic guidance. The catheter was then placed through the sheath and the sheath was removed. Final catheter positioning was confirmed and documented with a fluoroscopic spot  radiograph. The port was accessed with a  Huber needle, aspirated and flushed with heparinized saline. The venotomy site was closed with an interrupted 4-0 Vicryl suture. The port pocket incision was closed with interrupted 2-0 Vicryl suture. Dermabond and Steri-strips were applied to both incisions. Dressings were applied. Attention was now paid towards image guided biopsy of dominant peritoneal mass. Sonographic evaluation performed of the ventral aspect of the left mid abdomen demonstrating an at least 8.3 x 2.1 cm hypoechoic macrolobulated mass (image 1), correlating with the mass seen on preceding abdominal CT image 50, series 3. The procedure was planned. The skin overlying the operative site was prepped and draped in usual sterile fashion. After the overlying soft tissues were anesthetized with 1% lidocaine with epinephrine, a 17 gauge coaxial needle was advanced into the peripheral aspect of the mass. Next, under direct ultrasound guidance, 6 core needle biopsies were obtained yielding the return of adequate tissue. Samples were submitted to the laboratory for pathologic analysis. The coaxial needle was removed following the administration of a Gel-Foam slurry and superficial hemostasis achieved with manual compression. Postprocedural imaging was negative for complication, specifically, no significant Peri biopsy hematoma. A dressing was applied. The patient tolerated the above procedures well without immediate post procedural complication. FINDINGS: After catheter placement, the tip lies within the superior cavoatrial junction. The catheter aspirates and flushes normally and is ready for immediate use. Sonographic evaluation of the left mid abdomen demonstrates an at least 8.3 x 2.1 cm hypoechoic macrolobulated mass, correlating with the mass seen on preceding abdominal CT. IMPRESSION: 1. Successful placement of a right internal jugular approach power injectable Port-A-Cath. The catheter is ready for  immediate use. 2. Successful ultrasound-guided biopsy of peritoneal mass within the ventral aspect of the left mid/lateral abdomen. Electronically Signed   By: Simonne Come M.D.   On: 05/10/2023 14:59   IR US Guide Bx Asp/Drain Result Date: 05/10/2023 INDICATION: Concern for metastatic ovarian cancer. Please perform image guided biopsy of peritoneal mass for tissue diagnostic purposes. Please perform image guided Port a catheter placement for durable intravenous access for impending chemotherapy. EXAM: 1. IMPLANTED PORT A CATH PLACEMENT WITH ULTRASOUND AND FLUOROSCOPIC GUIDANCE 2. ULTRASOUND-GUIDED LEFT VENTRAL LATERAL PERITONEAL ABDOMINAL MASS BIOPSY COMPARISON:  Chest CT-05/09/2023 CT abdomen and pelvis-05/08/2023 MEDICATIONS: None ANESTHESIA/SEDATION: Moderate (conscious) sedation was employed during this procedure as administered by the Interventional Radiology RN. A total of Versed 3 mg and Fentanyl 125 mcg was administered intravenously. Moderate Sedation Time: 46 minutes. The patient's level of consciousness and vital signs were monitored continuously by radiology nursing throughout the procedure under my direct supervision. CONTRAST:  None FLUOROSCOPY TIME:  42 seconds (4 mGy) COMPLICATIONS: None immediate. PROCEDURE: The procedure, risks, benefits, and alternatives were explained to the patient. Questions regarding the procedure were encouraged and answered. The patient understands and consents to the procedure. The right neck and chest were prepped with chlorhexidine in a sterile fashion, and a sterile drape was applied covering the operative field. Maximum barrier sterile technique with sterile gowns and gloves were used for the procedure. A timeout was performed prior to the initiation of the procedure. Local anesthesia was provided with 1% lidocaine with epinephrine. After creating a small venotomy incision, a micropuncture kit was utilized to access the internal jugular vein. Real-time ultrasound  guidance was utilized for vascular access including the acquisition of a permanent ultrasound image documenting patency of the accessed vessel. The microwire was utilized to measure appropriate catheter length. A subcutaneous port pocket was then created along the upper chest wall utilizing a  combination of sharp and blunt dissection. The pocket was irrigated with sterile saline. A single lumen Slim sized power injectable port was chosen for placement. The 8 Fr catheter was tunneled from the port pocket site to the venotomy incision. The port was placed in the pocket. The external catheter was trimmed to appropriate length. At the venotomy, an 8 Fr peel-away sheath was placed over a guidewire under fluoroscopic guidance. The catheter was then placed through the sheath and the sheath was removed. Final catheter positioning was confirmed and documented with a fluoroscopic spot radiograph. The port was accessed with a Huber needle, aspirated and flushed with heparinized saline. The venotomy site was closed with an interrupted 4-0 Vicryl suture. The port pocket incision was closed with interrupted 2-0 Vicryl suture. Dermabond and Steri-strips were applied to both incisions. Dressings were applied. Attention was now paid towards image guided biopsy of dominant peritoneal mass. Sonographic evaluation performed of the ventral aspect of the left mid abdomen demonstrating an at least 8.3 x 2.1 cm hypoechoic macrolobulated mass (image 1), correlating with the mass seen on preceding abdominal CT image 50, series 3. The procedure was planned. The skin overlying the operative site was prepped and draped in usual sterile fashion. After the overlying soft tissues were anesthetized with 1% lidocaine with epinephrine, a 17 gauge coaxial needle was advanced into the peripheral aspect of the mass. Next, under direct ultrasound guidance, 6 core needle biopsies were obtained yielding the return of adequate tissue. Samples were submitted  to the laboratory for pathologic analysis. The coaxial needle was removed following the administration of a Gel-Foam slurry and superficial hemostasis achieved with manual compression. Postprocedural imaging was negative for complication, specifically, no significant Peri biopsy hematoma. A dressing was applied. The patient tolerated the above procedures well without immediate post procedural complication. FINDINGS: After catheter placement, the tip lies within the superior cavoatrial junction. The catheter aspirates and flushes normally and is ready for immediate use. Sonographic evaluation of the left mid abdomen demonstrates an at least 8.3 x 2.1 cm hypoechoic macrolobulated mass, correlating with the mass seen on preceding abdominal CT. IMPRESSION: 1. Successful placement of a right internal jugular approach power injectable Port-A-Cath. The catheter is ready for immediate use. 2. Successful ultrasound-guided biopsy of peritoneal mass within the ventral aspect of the left mid/lateral abdomen. Electronically Signed   By: Simonne Come M.D.   On: 05/10/2023 14:59   CT Angio Chest Aorta W and/or Wo Contrast Result Date: 05/09/2023 CLINICAL DATA:  Constipation and abdominal pain. Acute aortic syndrome suspected. EXAM: CT ANGIOGRAPHY CHEST WITH CONTRAST TECHNIQUE: Multidetector CT imaging of the chest was performed using the standard protocol during bolus administration of intravenous contrast. Multiplanar CT image reconstructions and MIPs were obtained to evaluate the vascular anatomy. RADIATION DOSE REDUCTION: This exam was performed according to the departmental dose-optimization program which includes automated exposure control, adjustment of the mA and/or kV according to patient size and/or use of iterative reconstruction technique. CONTRAST:  60mL OMNIPAQUE IOHEXOL 350 MG/ML SOLN COMPARISON:  Radiograph 04/27/2023 and CT chest 05/15/2022 FINDINGS: Cardiovascular: Normal heart size. No pericardial effusion.  Aortic atherosclerotic calcification. No aortic aneurysm, dissection, or penetrating atherosclerotic ulcer. Coronary artery calcification. Mediastinum/Nodes: Trachea and esophagus are unremarkable. No thoracic adenopathy. Lungs/Pleura: Centrilobular emphysema greatest in the apices. Bronchial wall thickening and mucous plugging in the lower lobes. Right lower lobe atelectasis. Clustered centrilobular micro nodules and tree-in-bud opacities in the right upper lobe. No pleural effusion or pneumothorax. Upper Abdomen: No acute abnormality. Musculoskeletal:  No acute fracture.  Thoracic spondylosis. Review of the MIP images confirms the above findings. IMPRESSION: 1. No aortic aneurysm or dissection. 2. Emphysema and chronic bronchitis. Aortic Atherosclerosis (ICD10-I70.0) and Emphysema (ICD10-J43.9). Electronically Signed   By: Rozell Cornet M.D.   On: 05/09/2023 00:15   CT ABDOMEN PELVIS W CONTRAST Result Date: 05/08/2023 CLINICAL DATA:  Left lower quadrant pain EXAM: CT ABDOMEN AND PELVIS WITH CONTRAST TECHNIQUE: Multidetector CT imaging of the abdomen and pelvis was performed using the standard protocol following bolus administration of intravenous contrast. RADIATION DOSE REDUCTION: This exam was performed according to the departmental dose-optimization program which includes automated exposure control, adjustment of the mA and/or kV according to patient size and/or use of iterative reconstruction technique. CONTRAST:  50mL OMNIPAQUE IOHEXOL 350 MG/ML SOLN COMPARISON:  CT 10/30/2015 FINDINGS: Lower chest: Lung bases demonstrate bandlike scarring within the lower lobes and right middle lobe. No acute airspace disease. Hepatobiliary: Subcentimeter hypodensities in the liver too small to further characterize. No calcified gallstone or biliary dilatation. Pancreas: Unremarkable. No pancreatic ductal dilatation or surrounding inflammatory changes. Spleen: Normal in size without focal abnormality. Adrenals/Urinary  Tract: Adrenal glands show no focal abnormality. Kidneys show no hydronephrosis. Simple bilateral renal cysts for which no imaging follow-up is recommended. 2.1 cm superior pole exophytic complex cyst. Bladder partially obscured Stomach/Bowel: Stomach nonenlarged. No dilated small bowel. No acute bowel wall thickening. Diverticular disease of the left colon. Vascular/Lymphatic: Advanced aortic atherosclerosis. No aneurysm. Mildly enlarged left Peri aortic node measuring 19 mm on series 3, image 42. Reproductive: Uterus is unremarkable. Heterogenous left adnexal mass lesion measuring 6.8 x 5.8 by 7 cm concerning for neoplasm. Other: Small volume free fluid within the pelvis. Evidence for peritoneal metastatic disease. 10 mm left upper quadrant peritoneal nodule on series 3, image 35. Right mid quadrant soft tissue mass measuring 5.7 x 2.4 cm on series 3, image 44. Left lower quadrant soft tissue mass measuring 8.1 by 2.5 by 12.1 cm. Soft tissue nodule in the right colic gutter measuring 1.7 cm on series 3, image 55. Right posterior pelvic soft tissue mass measuring 3.6 x 2.6 cm on series 3, image 76. Additional small mesenteric nodules are noted. Musculoskeletal: No acute or suspicious osseous abnormality. Left hip replacement with artifact. IMPRESSION: 1. Large heterogeneous cystic and solid left adnexal mass highly suspicious for ovarian neoplasm. 2. Positive for peritoneal metastatic disease as evidence by multiple intra-abdominal and pelvic tumor implants. Trace volume ascites in the pelvis. Several mildly enlarged retroperitoneal lymph nodes also suspicious for metastatic disease. 3. Diverticular disease of the left colon without acute inflammatory process. 4. Aortic atherosclerosis. Aortic Atherosclerosis (ICD10-I70.0). Electronically Signed   By: Esmeralda Hedge M.D.   On: 05/08/2023 19:53   DG Chest 2 View Result Date: 04/27/2023 CLINICAL DATA:  Cough.  Chills and congestion. EXAM: CHEST - 2 VIEW  COMPARISON:  03/15/2020. FINDINGS: Redemonstration of irregular opacity overlying the right lateral costophrenic angle, which corresponds to area of scarring/atelectasis on the prior CT scan from 05/15/2022. Bilateral lungs are otherwise clear. No acute consolidation or lung collapse. Bilateral costophrenic angles are clear. Normal cardio-mediastinal silhouette. No acute osseous abnormalities. The soft tissues are within normal limits. IMPRESSION: No active cardiopulmonary disease. Electronically Signed   By: Beula Brunswick M.D.   On: 04/27/2023 15:46      05/09/2023 Tumor Marker   Patient's tumor was tested for the following markers: CA-125. Results of the tumor marker test revealed 137.   05/10/2023 Pathology Results   SURGICAL PATHOLOGY  CASE: [id number]  PATIENT: Catherine Lowery  Surgical Pathology Report   Clinical History: concern for metastatic ovarian cancer (cm)   FINAL MICROSCOPIC DIAGNOSIS:   A. OMENTUM, MASS WITHIN LEFT MID ABDOMEN, NEEDLE CORE BIOPSY:  - Metastatic carcinoma with a gynecologic primary, see comment  Immunohistochemical stain show that the tumor cells are positive for CK7 and PAX8 while they are negative for CK20 and GATA3 with above interpretation.  Dr. LeGolvan reviewed the case and concurs with the above diagnosis.    05/14/2023 Cancer Staging   Staging form: Ovary, Fallopian Tube, and Primary Peritoneal Carcinoma, AJCC 8th Edition - Clinical stage from 05/14/2023: FIGO Stage IIIC (cT3c, cN1, cM0) - Signed by Almeda Jacobs, MD on 05/14/2023 Stage prefix: Initial diagnosis   05/25/2023 -  Chemotherapy   Patient is on Treatment Plan : OVARIAN Carboplatin (AUC 6) + Paclitaxel (175) q21d X 6 Cycles     07/11/2023 Imaging   CT ABDOMEN PELVIS W CONTRAST Result Date: 07/23/2023 CLINICAL DATA:  Ovarian cancer, assess treatment response. * Tracking Code: BO * EXAM: CT ABDOMEN AND PELVIS WITH CONTRAST TECHNIQUE: Multidetector CT imaging of the abdomen and pelvis was  performed using the standard protocol following bolus administration of intravenous contrast. RADIATION DOSE REDUCTION: This exam was performed according to the departmental dose-optimization program which includes automated exposure control, adjustment of the mA and/or kV according to patient size and/or use of iterative reconstruction technique. CONTRAST:  OMNIPAQUE IOHEXOL 300 MG/ML  SOLN COMPARISON:  Abdominopelvic CT 05/08/2023 and 10/30/2015 FINDINGS: Lower chest: Similar bandlike scarring at both lung bases. No confluent airspace disease, suspicious nodularity or pleural effusion. Aortic and coronary artery atherosclerosis noted. Hepatobiliary: The liver is normal in density without suspicious focal abnormality. Stable small low-density lesions inferiorly in the right hepatic lobe, likely cysts. No evidence of gallstones, gallbladder wall thickening or biliary dilatation. Pancreas: Unremarkable. No pancreatic ductal dilatation or surrounding inflammatory changes. Spleen: Normal in size without focal abnormality. Adrenals/Urinary Tract: Stable mild chronic enlargement of both adrenal glands, unchanged from 2017, consistent with mild hyperplasia. No evidence of urinary tract calculus, suspicious renal lesion or hydronephrosis. Stable bilateral renal cysts for which no specific follow-up imaging is recommended. The urinary bladder is largely obscured by artifact from the left total hip arthroplasty. No bladder abnormality identified. Stomach/Bowel: Enteric contrast has passed into the distal small bowel. The stomach appears unremarkable for its degree of distension. No evidence of bowel wall thickening, distention or surrounding inflammatory change. Prominent stool throughout the colon, especially within the rectum. Mild distal colonic diverticulosis. Vascular/Lymphatic: Interval resolution of previously demonstrated left periaortic adenopathy. No enlarged abdominal or pelvic lymph nodes are identified.  Diffuse aortic and branch vessel atherosclerosis without evidence of aneurysm or large vessel occlusion. Reproductive: Interval decreased size of previously demonstrated left adnexal mass, now measuring 4.7 x 4.1 cm on image 61/2 (previously 6.8 x 5.8 cm). No right adnexal mass. The uterus appears unremarkable. Other: Previously demonstrated omental nodularity has substantially improved in the interval. No significant residual measurable omental disease demonstrated. There is no significant ascites. A right perirectal mass measuring 2.5 x 1.7 cm on image 75/2 previously measured up to 3.6 cm. No progressive peritoneal disease identified. Musculoskeletal: No acute or significant osseous findings. Multilevel spondylosis. Previous left total hip arthroplasty with moderate right hip degenerative changes. IMPRESSION: 1. 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 No evidence of progressive disease. 2. Interval resolution of previously demonstrated left periaortic adenopathy. 3. No acute findings  or evidence of progressive disease. 4.  Aortic Atherosclerosis (ICD10-I70.0). Electronically Signed   By: Carey Bullocks M.D.   On: 07/23/2023 11:31        Interval History: Patient presents today with her sisters.  Reports that she has tolerated chemo well.  Denies any numbness.  Only had nausea with the first cycle and no nausea since.  Having a bowel movement about every other day.  Has Dulcolax at home and can take it.  Did present on 08/03/2023 for repeat labs and was transfused 1 unit of blood given anemia.     Past Medical/Surgical History: Past Medical History:  Diagnosis Date   Cancer (HCC) 03/2018   right breast cancer   Chronic kidney disease    CKD   COPD (chronic obstructive pulmonary disease) (HCC)    DJD (degenerative joint disease)    LEFT HIP   Family history of breast cancer    Family history of ovarian cancer    Family history of  prostate cancer    Family history of prostate cancer    H/O migraine 07/01/2019   Hyperlipidemia    takes Simvasatin daily   Hypertension    takes Propranolol and HCTZ daioly   Hyperthyroidism    Pneumonia 10/2015   PONV (postoperative nausea and vomiting)    when ether used   Reactive airway disease 05/08/2023   Shortness of breath dyspnea    with ambulation   Tremors of nervous system    takes Primidone daily    Past Surgical History:  Procedure Laterality Date   BOne Spur     left heel   BREAST LUMPECTOMY WITH RADIOACTIVE SEED AND SENTINEL LYMPH NODE BIOPSY Right 05/07/2018   Procedure: RIGHT BREAST LUMPECTOMY WITH RADIOACTIVE SEED AND RIGHT  SENTINEL LYMPH NODE MAPPING;  Surgeon: Harriette Bouillon, MD;  Location: [facility];  Service: General;  Laterality: Right;   COLONOSCOPY     IR IMAGING GUIDED PORT INSERTION  05/10/2023   IR US GUIDE BX ASP/DRAIN  05/10/2023   JOINT REPLACEMENT Bilateral    knee   KNEE SURGERY     RE-EXCISION OF BREAST LUMPECTOMY Right 06/05/2018   Procedure: RE-EXCISION OF RIGHT BREAST LUMPECTOMY;  Surgeon: Harriette Bouillon, MD;  Location:  SURGERY CENTER;  Service: General;  Laterality: Right;   ROTATOR CUFF REPAIR Bilateral    TOTAL HIP ARTHROPLASTY Right    TOTAL HIP ARTHROPLASTY Left 12/29/2015   Procedure: TOTAL HIP ARTHROPLASTY ANTERIOR APPROACH;  Surgeon: Loreta Ave, MD;  Location: [facility] OR;  Service: Orthopedics;  Laterality: Left;    Family History  Problem Relation Age of Onset   Hypertension Mother    Heart disease Mother    Stroke Mother    Multiple myeloma Father 24   Thyroid cancer Sister        dx 52's/50's   Cancer Paternal Aunt        unknown   Prostate cancer Paternal Uncle        dx >50- cancer was the cause of his death   Ovarian cancer Paternal Aunt    Throat cancer Paternal Uncle    Breast cancer Cousin        dx >50   Cancer Cousin        type unk   Cancer Cousin        type unk    Cancer Cousin        type unk    Social History   Socioeconomic History  Marital status: Single    Spouse name: Not on file   Number of children: 1   Years of education: 14   Highest education level: Not on file  Occupational History    Comment: retired  Tobacco Use   Smoking status: Every Day    Current packs/day: 0.75    Average packs/day: 0.8 packs/day for 28.0 years (21.0 ttl pk-yrs)    Types: Cigarettes   Smokeless tobacco: Never   Tobacco comments:    02/13/22 Pt stated "I'm trying to quit" Harding Li, CMA   Vaping Use   Vaping status: Never Used  Substance and Sexual Activity   Alcohol use: No    Alcohol/week: 0.0 standard drinks of alcohol   Drug use: No   Sexual activity: Not Currently    Birth control/protection: Post-menopausal  Other Topics Concern   Not on file  Social History Narrative   Lives with handicapped son   Caffeine- coffee, 2 cups daily, 1-2 sodas daily   Social Drivers of Health   Financial Resource Strain: Low Risk  (01/28/2020)   Overall Financial Resource Strain (CARDIA)    Difficulty of Paying Living Expenses: Not very hard  Food Insecurity: No Food Insecurity (05/09/2023)   Hunger Vital Sign    Worried About Running Out of Food in the Last Year: Never true    Ran Out of Food in the Last Year: Never true  Transportation Needs: No Transportation Needs (05/09/2023)   PRAPARE - Administrator, Civil Service (Medical): No    Lack of Transportation (Non-Medical): No  Physical Activity: Not on file  Stress: Not on file  Social Connections: Moderately Integrated (05/09/2023)   Social Connection and Isolation Panel [NHANES]    Frequency of Communication with Friends and Family: More than three times a week    Frequency of Social Gatherings with Friends and Family: Three times a week    Attends Religious Services: More than 4 times per year    Active Member of Clubs or Organizations: Yes    Attends Banker  Meetings: Never    Marital Status: Divorced    Current Medications:  Current Outpatient Medications:    albuterol (VENTOLIN HFA) 108 (90 Base) MCG/ACT inhaler, Inhale 2 puffs into the lungs every 4 (four) hours as needed for wheezing or shortness of breath., Disp: , Rfl:    amLODipine (NORVASC) 10 MG tablet, Take 1 tablet (10 mg total) by mouth daily., Disp: 90 tablet, Rfl: 1   aspirin (ASPIRIN ADULT) 325 MG tablet, enteric coated; may use OTC (Patient taking differently: Take 325 mg by mouth daily. enteric coated; may use OTC), Disp: 30 tablet, Rfl: 0   atorvastatin (LIPITOR) 40 MG tablet, Take 1 tablet (40 mg total) by mouth daily., Disp: 90 tablet, Rfl: 1   bisacodyl (DULCOLAX) 5 MG EC tablet, Take 2 tablets (10 mg total) by mouth daily as needed for moderate constipation., Disp: , Rfl:    Cholecalciferol (VITAMIN D3) 50 MCG (2000 UT) capsule, Take 1 capsule (2,000 Units total) by mouth daily., Disp: 90 capsule, Rfl: 1   dexamethasone (DECADRON) 4 MG tablet, Take 2 tabs at the night before and 2 tab the morning of chemotherapy, every 3 weeks, by mouth x 6 cycles, Disp: 24 tablet, Rfl: 6   levothyroxine (SYNTHROID) 50 MCG tablet, Take 1 tablet (50 mcg total) by mouth daily., Disp: 90 tablet, Rfl: 3   lidocaine-prilocaine (EMLA) cream, Apply to affected area once, Disp: 30 g, Rfl:  3   losartan (COZAAR) 25 MG tablet, Take 25 mg by mouth daily., Disp: , Rfl:    Omega-3 Fatty Acids (FISH OIL PO), Take 1,000 mg by mouth daily., Disp: , Rfl:    ondansetron (ZOFRAN) 8 MG tablet, Take 1 tablet (8 mg total) by mouth every 8 (eight) hours as needed for nausea or vomiting. Start on the third day after carboplatin., Disp: 30 tablet, Rfl: 1   polyethylene glycol (MIRALAX / GLYCOLAX) 17 g packet, Take 34 g by mouth daily., Disp: , Rfl:    primidone (MYSOLINE) 50 MG tablet, Take 2 tablets (100 mg total) by mouth every morning., Disp: 180 tablet, Rfl: 2   prochlorperazine (COMPAZINE) 10 MG tablet, Take 1  tablet (10 mg total) by mouth every 6 (six) hours as needed for nausea or vomiting., Disp: 30 tablet, Rfl: 1   propranolol (INDERAL) 40 MG tablet, Take 0.5 tablets (20 mg total) by mouth 2 (two) times daily. (Patient taking differently: Take 20 mg by mouth daily.), Disp: 90 tablet, Rfl: 1   senna-docusate (SENOKOT-S) 8.6-50 MG tablet, Take 1 tablet by mouth 2 (two) times daily., Disp: , Rfl:    TRELEGY ELLIPTA 100-62.5-25 MCG/ACT AEPB, Inhale 1 puff into the lungs daily., Disp: , Rfl:    VOLTAREN 1 % GEL, APPLY TOPICALLY 4 TIMES DAILY AS NEEDED, Disp: 100 g, Rfl: 0  Review of Symptoms: Complete 10-system review is negative except as above in Interval History.  Physical Exam: LMP 01/24/2000  General: Alert, oriented, no acute distress. HEENT: Normocephalic, atraumatic. Neck symmetric without masses. Sclera anicteric.  Chest: Normal work of breathing. Clear to auscultation bilaterally.   Cardiovascular: Regular rate and rhythm, no murmurs. Abdomen: Soft, nontender.  Normoactive bowel sounds.  No masses appreciated.   Extremities: Grossly normal range of motion.  Warm, well perfused.  No edema bilaterally. Skin: No rashes or lesions noted. Lymphatics: No cervical, supraclavicular, or inguinal adenopathy. GU: Normal appearing external genitalia without erythema, excoriation, or lesions.  Speculum exam reveals atrophic but normal vaginal mucosa and cervix.  Bimanual exam reveals normal cervix and small uterus. Difficult to palpate adnexal due to patient discomfort.  Rectovaginal exam slight firmness on right adjacent to rectum. Exam chaperoned by Kimberly [place], CMA   Laboratory & Radiologic Studies: Lab Results  Component Value Date   CAN125 14.2 07/26/2023   CAN125 137.0 (H) 05/09/2023   CEA1 3.4 05/09/2023   LDH 147 06/23/2019     CT ABDOMEN PELVIS W CONTRAST 07/11/2023  Narrative CLINICAL DATA:  Ovarian cancer, assess treatment response. * Tracking Code: BO *  EXAM: CT ABDOMEN  AND PELVIS WITH CONTRAST  TECHNIQUE: Multidetector CT imaging of the abdomen and pelvis was performed using the standard protocol following bolus administration of intravenous contrast.  RADIATION DOSE REDUCTION: This exam was performed according to the departmental dose-optimization program which includes automated exposure control, adjustment of the mA and/or kV according to patient size and/or use of iterative reconstruction technique.  CONTRAST:  OMNIPAQUE IOHEXOL 300 MG/ML  SOLN  COMPARISON:  Abdominopelvic CT 05/08/2023 and 10/30/2015  FINDINGS: Lower chest: Similar bandlike scarring at both lung bases. No confluent airspace disease, suspicious nodularity or pleural effusion. Aortic and coronary artery atherosclerosis noted.  Hepatobiliary: The liver is normal in density without suspicious focal abnormality. Stable small low-density lesions inferiorly in the right hepatic lobe, likely cysts. No evidence of gallstones, gallbladder wall thickening or biliary dilatation.  Pancreas: Unremarkable. No pancreatic ductal dilatation or surrounding inflammatory changes.  Spleen: Normal  in size without focal abnormality.  Adrenals/Urinary Tract: Stable mild chronic enlargement of both adrenal glands, unchanged from 2017, consistent with mild hyperplasia. No evidence of urinary tract calculus, suspicious renal lesion or hydronephrosis. Stable bilateral renal cysts for which no specific follow-up imaging is recommended. The urinary bladder is largely obscured by artifact from the left total hip arthroplasty. No bladder abnormality identified.  Stomach/Bowel: Enteric contrast has passed into the distal small bowel. The stomach appears unremarkable for its degree of distension. No evidence of bowel wall thickening, distention or surrounding inflammatory change. Prominent stool throughout the colon, especially within the rectum. Mild distal  colonic diverticulosis.  Vascular/Lymphatic: Interval resolution of previously demonstrated left periaortic adenopathy. No enlarged abdominal or pelvic lymph nodes are identified. Diffuse aortic and branch vessel atherosclerosis without evidence of aneurysm or large vessel occlusion.  Reproductive: Interval decreased size of previously demonstrated left adnexal mass, now measuring 4.7 x 4.1 cm on image 61/2 (previously 6.8 x 5.8 cm). No right adnexal mass. The uterus appears unremarkable.  Other: Previously demonstrated omental nodularity has substantially improved in the interval. No significant residual measurable omental disease demonstrated. There is no significant ascites. A right perirectal mass measuring 2.5 x 1.7 cm on image 75/2 previously measured up to 3.6 cm. No progressive peritoneal disease identified.  Musculoskeletal: No acute or significant osseous findings. Multilevel spondylosis. Previous left total hip arthroplasty with moderate right hip degenerative changes.  IMPRESSION: 1. 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 No evidence of progressive disease. 2. Interval resolution of previously demonstrated left periaortic adenopathy. 3. No acute findings or evidence of progressive disease. 4.  Aortic Atherosclerosis (ICD10-I70.0).   Electronically Signed By: Elmon Hagedorn M.D. On: 07/23/2023 11:31

## 2023-08-13 NOTE — Progress Notes (Signed)
 Patient here for a follow up with Dr. Daisey Dryer and for a pre-operative appointment prior to her scheduled surgery on 08/21/2023. She is scheduled for a robotic assisted total laparoscopic hysterectomy, bilateral salpingo-oophorectomy, omentectomy, tumor debulking, possible laparotomy, possible bowel surgery. The surgery was discussed in detail.  See after visit summary for additional details.      Discussed post-op pain management in detail including the aspects of the enhanced recovery pathway.  Advised her that a new prescription would be sent in for Tramadol and it is only to be used for after her upcoming surgery.  We discussed the use of tylenol post-op and to monitor for a maximum of 4,000 mg in a 24 hour period.  Also let her know that sennakot will be prescribed to be used after surgery and to hold if having loose stools.  Discussed bowel regimen in detail.   Discussed the use of SCDs and measures to take at home to prevent DVT including frequent mobility.  Reportable signs and symptoms of DVT discussed. Post-operative instructions discussed and expectations for after surgery. Incisional care discussed as well including reportable signs and symptoms including erythema, drainage, wound separation.     30 minutes spent with the patient.  Verbalizing understanding of material discussed. No needs or concerns voiced at the end of the visit.   Advised patient to call for any needs.  Advised that her post-operative medications had been prescribed and could be picked up at any time.    This appointment is included in the global surgical bundle as pre-operative teaching and has no charge.

## 2023-08-13 NOTE — Patient Instructions (Addendum)
 Preparing for your Surgery  Plan for surgery on 08/21/2023 with Dr. Clide Cliff at [facility]. You will be scheduled for robotic assisted total laparoscopic hysterectomy (removal of the uterus and cervix), bilateral salpingo-oophorectomy (removal of both ovaries and fallopian tubes), omentectomy, tumor debulking, possible laparotomy (larger incision on your abdomen if needed), possible bowel surgery.   Pre-operative Testing -You will receive a phone call from presurgical testing at [facility] to arrange for a pre-operative appointment and lab work.  -Bring your insurance card, copy of an advanced directive if applicable, medication list  -At that visit, you will be asked to sign a consent for a possible blood transfusion in case a transfusion becomes necessary during surgery.  The need for a blood transfusion is rare but having consent is a necessary part of your care.     -STOP TAKING ASPIRIN 325 MG NOW. PLAN ON CHANGING TO ASPIRIN 81 MG ONCE DAILY. LAST DOSE WILL BE THE DAY BEFORE SURGERY. DO NOT TAKE THE MORNING OF.  -STOP FISH OIL NOW. Do not take supplements such as fish oil (omega 3), red yeast rice, turmeric before your surgery. STOP TAKING AT LEAST 10 DAYS BEFORE SURGERY. You want to avoid medications with aspirin in them including headache powders such as BC or Goody's), Excedrin migraine.  Day Before Surgery at Home -You will be asked to take in a light diet the day before surgery. You will be advised you can have clear liquids up until 3 hours before your surgery.    Eat a light diet the day before surgery.  Examples including soups, broths, toast, yogurt, mashed potatoes.  AVOID GAS PRODUCING FOODS AND BEVERAGES. Things to avoid include carbonated beverages (fizzy beverages, sodas), raw fruits and raw vegetables (uncooked), or beans.   If your bowels are filled with gas, your surgeon will have difficulty visualizing your pelvic organs which increases your  surgical risks.  Your role in recovery Your role is to become active as soon as directed by your doctor, while still giving yourself time to heal.  Rest when you feel tired. You will be asked to do the following in order to speed your recovery:  - Cough and breathe deeply. This helps to clear and expand your lungs and can prevent pneumonia after surgery.  - STAY ACTIVE WHEN YOU GET HOME. Do mild physical activity. Walking or moving your legs help your circulation and body functions return to normal. Do not try to get up or walk alone the first time after surgery.   -If you develop swelling on one leg or the other, pain in the back of your leg, redness/warmth in one of your legs, please call the office or go to the Emergency Room to have a doppler to rule out a blood clot. For shortness of breath, chest pain-seek care in the Emergency Room as soon as possible. - Actively manage your pain. Managing your pain lets you move in comfort. We will ask you to rate your pain on a scale of zero to 10. It is your responsibility to tell your doctor or nurse where and how much you hurt so your pain can be treated.  Special Considerations -If you are diabetic, you may be placed on insulin after surgery to have closer control over your blood sugars to promote healing and recovery.  This does not mean that you will be discharged on insulin.  If applicable, your oral antidiabetics will be resumed when you are tolerating a solid diet.  -  Your final pathology results from surgery should be available around one week after surgery and the results will be relayed to you when available.  -Dr. Antionette Char is the surgeon that assists your GYN Oncologist with surgery.  If you end up staying the night, the next day after your surgery you will either see Dr. Pricilla Holm, Dr. Alvester Morin, or Dr. Antionette Char.  -FMLA forms can be faxed to [phone] and please allow 5-7 business days for completion.  Pain Management After  Surgery -You will be prescribed your pain medication and bowel regimen medications before surgery so that you can have these available when you are discharged from the hospital. The pain medication is for use ONLY AFTER surgery and a new prescription will not be given.   -Make sure that you have Tylenol IF YOU ARE ABLE TO TAKE THESE MEDICATION at home to use on a regular basis after surgery for pain control.   -Review the attached handout on narcotic use and their risks and side effects.   Bowel Regimen -You will be prescribed Sennakot-S to take nightly to prevent constipation especially if you are taking the narcotic pain medication intermittently.  It is important to prevent constipation and drink adequate amounts of liquids. You can stop taking this medication when you are not taking pain medication and you are back on your normal bowel routine.  Risks of Surgery Risks of surgery are low but include bleeding, infection, damage to surrounding structures, re-operation, blood clots, and very rarely death.   Blood Transfusion Information (For the consent to be signed before surgery)  We will be checking your blood type before surgery so in case of emergencies, we will know what type of blood you would need.                                            WHAT IS A BLOOD TRANSFUSION?  A transfusion is the replacement of blood or some of its parts. Blood is made up of multiple cells which provide different functions. Red blood cells carry oxygen and are used for blood loss replacement. White blood cells fight against infection. Platelets control bleeding. Plasma helps clot blood. Other blood products are available for specialized needs, such as hemophilia or other clotting disorders. BEFORE THE TRANSFUSION  Who gives blood for transfusions?  You may be able to donate blood to be used at a later date on yourself (autologous donation). Relatives can be asked to donate blood. This is generally not  any safer than if you have received blood from a stranger. The same precautions are taken to ensure safety when a relative's blood is donated. Healthy volunteers who are fully evaluated to make sure their blood is safe. This is blood bank blood. Transfusion therapy is the safest it has ever been in the practice of medicine. Before blood is taken from a donor, a complete history is taken to make sure that person has no history of diseases nor engages in risky social behavior (examples are intravenous drug use or sexual activity with multiple partners). The donor's travel history is screened to minimize risk of transmitting infections, such as malaria. The donated blood is tested for signs of infectious diseases, such as HIV and hepatitis. The blood is then tested to be sure it is compatible with you in order to minimize the chance of a transfusion reaction. If you or  a relative donates blood, this is often done in anticipation of surgery and is not appropriate for emergency situations. It takes many days to process the donated blood. RISKS AND COMPLICATIONS Although transfusion therapy is very safe and saves many lives, the main dangers of transfusion include:  Getting an infectious disease. Developing a transfusion reaction. This is an allergic reaction to something in the blood you were given. Every precaution is taken to prevent this. The decision to have a blood transfusion has been considered carefully by your caregiver before blood is given. Blood is not given unless the benefits outweigh the risks.  AFTER SURGERY INSTRUCTIONS  Return to work: 4-6 weeks if applicable  Activity: 1. Be up and out of the bed during the day.  Take a nap if needed.  You may walk up steps but be careful and use the hand rail.  Stair climbing will tire you more than you think, you may need to stop part way and rest.   2. No lifting or straining for 6 weeks over 10 pounds. No pushing, pulling, straining for 6  weeks.  3. No driving for 1-61 days when the following criteria have been met: Do not drive if you are taking narcotic pain medicine and make sure that your reaction time has returned.   4. You can shower as soon as the next day after surgery. Shower daily.  Use your regular soap and water (not directly on the incision) and pat your incision(s) dry afterwards; don't rub.  No tub baths or submerging your body in water until cleared by your surgeon. If you have the soap that was given to you by pre-surgical testing that was used before surgery, you do not need to use it afterwards because this can irritate your incisions.   5. No sexual activity and nothing in the vagina for 12 weeks.  6. You may experience a small amount of clear drainage from your incisions, which is normal.  If the drainage persists, increases, or changes color please call the office.  7. Do not use creams, lotions, or ointments such as neosporin on your incisions after surgery until advised by your surgeon because they can cause removal of the dermabond glue on your incisions.    8. You may experience vaginal spotting after surgery or when the stitches at the top of the vagina begin to dissolve.  The spotting is normal but if you experience heavy bleeding, call our office.  9. Take Tylenol first for pain if you are able to take these medication and only use narcotic pain medication for severe pain not relieved by the Tylenol.  Monitor your Tylenol intake to a max of 4,000 mg in a 24 hour period.  Diet: 1. Low sodium Heart Healthy Diet is recommended but you are cleared to resume your normal (before surgery) diet after your procedure.  2. It is safe to use a laxative, such as Miralax or Colace, if you have difficulty moving your bowels before surgery. You have been prescribed Sennakot-S to take at bedtime every evening after surgery to keep bowel movements regular and to prevent constipation.    Wound Care: 1. Keep clean and  dry.  Shower daily.  Reasons to call the Doctor: Fever - Oral temperature greater than 100.4 degrees Fahrenheit Foul-smelling vaginal discharge Difficulty urinating Nausea and vomiting Increased pain at the site of the incision that is unrelieved with pain medicine. Difficulty breathing with or without chest pain New calf pain especially if only on  one side Sudden, continuing increased vaginal bleeding with or without clots.   Contacts: For questions or concerns you should contact:  Dr. Derrel Flies at [phone]  Vira Grieves, NP at [phone]  After Hours: call [phone] and have the GYN Oncologist paged/contacted (after 5 pm or on the weekends). You will speak with an after hours RN and let he or she know you have had surgery.  Messages sent via mychart are for non-urgent matters and are not responded to after hours so for urgent needs, please call the after hours number.

## 2023-08-13 NOTE — Telephone Encounter (Signed)
-----   Message from Suellyn Emory sent at 08/13/2023 12:22 PM EDT ----- Please let her know Dr. Daisey Dryer does not feel she needs additional blood at this time based on labs and we will plan on rechecking this the morning of surgery again.

## 2023-08-13 NOTE — Progress Notes (Signed)
 Gynecologic Oncology Return Clinic Visit  Date of Service: 08/13/2023 Referring Provider: Dr. [name]   Assessment & Plan: Catherine Lowery is a 78 y.o. 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  Reviewed her imaging following 3 cycles of chemotherapy.  This has shown good response to treatment.  Also her CA125 has normalized.  Feel that patient is a reasonable candidate to proceed with interval debulking surgery.  Patient was consented for: robotic assisted total laparoscopic hysterectomy, bilateral salpingo-oophorectomy, omentectomy, tumor debulking, possible laparotomy, possible bowel surgery on 08/21/23.  The risks of surgery were discussed in detail and she understands these to including but not limited to bleeding requiring a blood transfusion, infection, injury to adjacent organs (including but not limited to the bowels, bladder, ureters, nerves, blood vessels), thromboembolic events, wound separation, hernia, vaginal cuff separation, possible risk of lymphedema and lymphocyst if lymphadenectomy performed, unforseen complication, possible need for re-exploration, and possible permanent ostomy.  If the patient experiences any of these events, she understands that her hospitalization or recovery may be prolonged and that she may need to take additional medications for a prolonged period. The patient will receive DVT and antibiotic prophylaxis as indicated. She voiced a clear understanding. She had the opportunity to ask questions and informed consent was obtained today. She wishes to proceed.  She will proceed to the lab today for CBC, CMP. Will see if pt needs additional blood prior to surgery. Also recommend that pt start dulcolax so she is having slightly more frequent BM prior to surgery given some stool in colon on imaging. She does not require  preoperative clearance. Her METs are >4.  We will have pt change from 325mg  aspirin to 81mg . All preoperative instructions were reviewed. Postoperative expectations were also reviewed. Written handouts were provided to the patient.  RTC postop.  Clide Cliff, MD Gynecologic Oncology   Medical Decision Making I personally spent  TOTAL 40 minutes face-to-face and non-face-to-face in the care of this patient, which includes all pre, intra, and post visit time on the date of service.   ----------------------- Reason for Visit: Follow-up  Treatment History: Oncology History  Malignant neoplasm of lower-inner quadrant of right breast of female, estrogen receptor positive (HCC)  03/18/2018 Initial Diagnosis   Screening mammography on 02/27/2019 revealed a right breast abnormality, Right breast lower inner quadrant biopsy for a clinical T2N0, [name], grade 2 or 3, estrogen and progesterone receptor positive, [name], with an MIB-1 of 5%   05/07/2018 Cancer Staging   Staging form: Breast, AJCC 8th Edition - Pathologic stage from 05/07/2018: Stage IB (pT2, pN53mi, cM0, G2, ER+, PR+, HER2-) - Signed by Loa Socks, NP on 04/07/2019   05/07/2018 Surgery   pT2 pN1, [name], grade 2, with a positive inferior margin.   05/07/2018 Oncotype testing   MammaPrint obtained from the definitive surgical sample read as "low risk" predicting a disease-free survival at 5 years of 97.8% without need of chemotherapy.   05/18/2018 Genetic Testing   The Common Hereditary Cancers Panel + Thyroid Caner panel was ordered (55 genes): APC, [name], [name], [name], [name], [name], [name], [name], [name], [name], [name], [name], [name], [name], [name], [name], [name], [name], [name], [name], [name], KIT, MEN1, [name], [name], [name], [name], [name], [name], [name], [name], [name], [name], [name], [name], [name], POLE, [name], [name], RAD50, [name], [name],  [name], [name], [name], [name], [name], [name], [name], [name], [name], [name], [name], [name], [name], VHL.  Results: No pathogenic variants identified.  2 variants of uncertain significance in the genes [name] c.1176C>T (Silent) and [name] c.1419+4C>T (Intronic) were identified.  The date of this test report is 05/18/2018.   UPDATE: The [name] c.1419+4C>T (Intronic) VUS has been reclassified to "Likely Benign." The report date is 08/08/2018. UPDATE: The [name] c.1176C>T (Silent) VUS has been reclassified to "Likely Benign." The report date is 09/20/2022.    06/05/2018 Surgery   ([id number]) Reexcision of the right breast; all margins negative.   07/11/2018 - 08/27/2018 Radiation Therapy   The patient initially received a dose of 50.4 Gy in 28 fractions to the breast using whole-breast tangent fields with an additional 45 Gy in 25 fractions to the right axilla. This was delivered using a 3-D conformal technique. The pt received a boost delivering an additional 12 Gy in 6 fractions using a electron boost with electrons. The total dose was 107.4 Gy.    09/2018 - 09/2023 Anti-estrogen oral therapy   anastrozole   Left ovarian epithelial cancer (HCC)  05/08/2023 Initial Diagnosis   Left ovarian epithelial cancer (HCC)   05/08/2023 Imaging   IR IMAGING GUIDED PORT INSERTION Result Date: 05/10/2023 INDICATION: Concern for metastatic ovarian cancer. Please perform image guided biopsy of peritoneal mass for tissue diagnostic purposes. Please perform image guided Port a catheter placement for durable intravenous access for impending chemotherapy. EXAM: 1. IMPLANTED PORT A CATH PLACEMENT WITH ULTRASOUND AND FLUOROSCOPIC GUIDANCE 2. ULTRASOUND-GUIDED LEFT VENTRAL LATERAL PERITONEAL ABDOMINAL MASS BIOPSY COMPARISON:  Chest CT-05/09/2023 CT abdomen and pelvis-05/08/2023 MEDICATIONS: None ANESTHESIA/SEDATION: Moderate (conscious) sedation was employed during this procedure as administered by the Interventional Radiology RN. A total of  Versed 3 mg and Fentanyl 125 mcg was administered intravenously. Moderate Sedation Time: 46 minutes. The patient's level of consciousness and vital signs were monitored continuously by radiology nursing throughout the procedure under my direct supervision. CONTRAST:  None FLUOROSCOPY TIME:  42 seconds (4 mGy) COMPLICATIONS: None immediate. PROCEDURE: The procedure, risks, benefits, and alternatives were explained to the patient. Questions regarding the procedure were encouraged and answered. The patient understands and consents to the procedure. The right neck and chest were prepped with chlorhexidine in a sterile fashion, and a sterile drape was applied covering the operative field. Maximum barrier sterile technique with sterile gowns and gloves were used for the procedure. A timeout was performed prior to the initiation of the procedure. Local anesthesia was provided with 1% lidocaine with epinephrine. After creating a small venotomy incision, a micropuncture kit was utilized to access the internal jugular vein. Real-time ultrasound guidance was utilized for vascular access including the acquisition of a permanent ultrasound image documenting patency of the accessed vessel. The microwire was utilized to measure appropriate catheter length. A subcutaneous port pocket was then created along the upper chest wall utilizing a combination of sharp and blunt dissection. The pocket was irrigated with sterile saline. A single lumen Slim sized power injectable port was chosen for placement. The 8 Fr catheter was tunneled from the port pocket site to the venotomy incision. The port was placed in the pocket. The external catheter was trimmed to appropriate length. At the venotomy, an 8 Fr peel-away sheath was placed over a guidewire under fluoroscopic guidance. The catheter was then placed through the sheath and the sheath was removed. Final catheter positioning was confirmed and documented with a fluoroscopic spot  radiograph. The port was accessed with a  Huber needle, aspirated and flushed with heparinized saline. The venotomy site was closed with an interrupted 4-0 Vicryl suture. The port pocket incision was closed with interrupted 2-0 Vicryl suture. Dermabond and Steri-strips were applied to both incisions. Dressings were applied. Attention was now paid towards image guided biopsy of dominant peritoneal mass. Sonographic evaluation performed of the ventral aspect of the left mid abdomen demonstrating an at least 8.3 x 2.1 cm hypoechoic macrolobulated mass (image 1), correlating with the mass seen on preceding abdominal CT image 50, series 3. The procedure was planned. The skin overlying the operative site was prepped and draped in usual sterile fashion. After the overlying soft tissues were anesthetized with 1% lidocaine with epinephrine, a 17 gauge coaxial needle was advanced into the peripheral aspect of the mass. Next, under direct ultrasound guidance, 6 core needle biopsies were obtained yielding the return of adequate tissue. Samples were submitted to the laboratory for pathologic analysis. The coaxial needle was removed following the administration of a Gel-Foam slurry and superficial hemostasis achieved with manual compression. Postprocedural imaging was negative for complication, specifically, no significant Peri biopsy hematoma. A dressing was applied. The patient tolerated the above procedures well without immediate post procedural complication. FINDINGS: After catheter placement, the tip lies within the superior cavoatrial junction. The catheter aspirates and flushes normally and is ready for immediate use. Sonographic evaluation of the left mid abdomen demonstrates an at least 8.3 x 2.1 cm hypoechoic macrolobulated mass, correlating with the mass seen on preceding abdominal CT. IMPRESSION: 1. Successful placement of a right internal jugular approach power injectable Port-A-Cath. The catheter is ready for  immediate use. 2. Successful ultrasound-guided biopsy of peritoneal mass within the ventral aspect of the left mid/lateral abdomen. Electronically Signed   By: Simonne Come M.D.   On: 05/10/2023 14:59   IR US Guide Bx Asp/Drain Result Date: 05/10/2023 INDICATION: Concern for metastatic ovarian cancer. Please perform image guided biopsy of peritoneal mass for tissue diagnostic purposes. Please perform image guided Port a catheter placement for durable intravenous access for impending chemotherapy. EXAM: 1. IMPLANTED PORT A CATH PLACEMENT WITH ULTRASOUND AND FLUOROSCOPIC GUIDANCE 2. ULTRASOUND-GUIDED LEFT VENTRAL LATERAL PERITONEAL ABDOMINAL MASS BIOPSY COMPARISON:  Chest CT-05/09/2023 CT abdomen and pelvis-05/08/2023 MEDICATIONS: None ANESTHESIA/SEDATION: Moderate (conscious) sedation was employed during this procedure as administered by the Interventional Radiology RN. A total of Versed 3 mg and Fentanyl 125 mcg was administered intravenously. Moderate Sedation Time: 46 minutes. The patient's level of consciousness and vital signs were monitored continuously by radiology nursing throughout the procedure under my direct supervision. CONTRAST:  None FLUOROSCOPY TIME:  42 seconds (4 mGy) COMPLICATIONS: None immediate. PROCEDURE: The procedure, risks, benefits, and alternatives were explained to the patient. Questions regarding the procedure were encouraged and answered. The patient understands and consents to the procedure. The right neck and chest were prepped with chlorhexidine in a sterile fashion, and a sterile drape was applied covering the operative field. Maximum barrier sterile technique with sterile gowns and gloves were used for the procedure. A timeout was performed prior to the initiation of the procedure. Local anesthesia was provided with 1% lidocaine with epinephrine. After creating a small venotomy incision, a micropuncture kit was utilized to access the internal jugular vein. Real-time ultrasound  guidance was utilized for vascular access including the acquisition of a permanent ultrasound image documenting patency of the accessed vessel. The microwire was utilized to measure appropriate catheter length. A subcutaneous port pocket was then created along the upper chest wall utilizing a  combination of sharp and blunt dissection. The pocket was irrigated with sterile saline. A single lumen Slim sized power injectable port was chosen for placement. The 8 Fr catheter was tunneled from the port pocket site to the venotomy incision. The port was placed in the pocket. The external catheter was trimmed to appropriate length. At the venotomy, an 8 Fr peel-away sheath was placed over a guidewire under fluoroscopic guidance. The catheter was then placed through the sheath and the sheath was removed. Final catheter positioning was confirmed and documented with a fluoroscopic spot radiograph. The port was accessed with a Huber needle, aspirated and flushed with heparinized saline. The venotomy site was closed with an interrupted 4-0 Vicryl suture. The port pocket incision was closed with interrupted 2-0 Vicryl suture. Dermabond and Steri-strips were applied to both incisions. Dressings were applied. Attention was now paid towards image guided biopsy of dominant peritoneal mass. Sonographic evaluation performed of the ventral aspect of the left mid abdomen demonstrating an at least 8.3 x 2.1 cm hypoechoic macrolobulated mass (image 1), correlating with the mass seen on preceding abdominal CT image 50, series 3. The procedure was planned. The skin overlying the operative site was prepped and draped in usual sterile fashion. After the overlying soft tissues were anesthetized with 1% lidocaine with epinephrine, a 17 gauge coaxial needle was advanced into the peripheral aspect of the mass. Next, under direct ultrasound guidance, 6 core needle biopsies were obtained yielding the return of adequate tissue. Samples were submitted  to the laboratory for pathologic analysis. The coaxial needle was removed following the administration of a Gel-Foam slurry and superficial hemostasis achieved with manual compression. Postprocedural imaging was negative for complication, specifically, no significant Peri biopsy hematoma. A dressing was applied. The patient tolerated the above procedures well without immediate post procedural complication. FINDINGS: After catheter placement, the tip lies within the superior cavoatrial junction. The catheter aspirates and flushes normally and is ready for immediate use. Sonographic evaluation of the left mid abdomen demonstrates an at least 8.3 x 2.1 cm hypoechoic macrolobulated mass, correlating with the mass seen on preceding abdominal CT. IMPRESSION: 1. Successful placement of a right internal jugular approach power injectable Port-A-Cath. The catheter is ready for immediate use. 2. Successful ultrasound-guided biopsy of peritoneal mass within the ventral aspect of the left mid/lateral abdomen. Electronically Signed   By: Simonne Come M.D.   On: 05/10/2023 14:59   CT Angio Chest Aorta W and/or Wo Contrast Result Date: 05/09/2023 CLINICAL DATA:  Constipation and abdominal pain. Acute aortic syndrome suspected. EXAM: CT ANGIOGRAPHY CHEST WITH CONTRAST TECHNIQUE: Multidetector CT imaging of the chest was performed using the standard protocol during bolus administration of intravenous contrast. Multiplanar CT image reconstructions and MIPs were obtained to evaluate the vascular anatomy. RADIATION DOSE REDUCTION: This exam was performed according to the departmental dose-optimization program which includes automated exposure control, adjustment of the mA and/or kV according to patient size and/or use of iterative reconstruction technique. CONTRAST:  60mL OMNIPAQUE IOHEXOL 350 MG/ML SOLN COMPARISON:  Radiograph 04/27/2023 and CT chest 05/15/2022 FINDINGS: Cardiovascular: Normal heart size. No pericardial effusion.  Aortic atherosclerotic calcification. No aortic aneurysm, dissection, or penetrating atherosclerotic ulcer. Coronary artery calcification. Mediastinum/Nodes: Trachea and esophagus are unremarkable. No thoracic adenopathy. Lungs/Pleura: Centrilobular emphysema greatest in the apices. Bronchial wall thickening and mucous plugging in the lower lobes. Right lower lobe atelectasis. Clustered centrilobular micro nodules and tree-in-bud opacities in the right upper lobe. No pleural effusion or pneumothorax. Upper Abdomen: No acute abnormality. Musculoskeletal:  No acute fracture.  Thoracic spondylosis. Review of the MIP images confirms the above findings. IMPRESSION: 1. No aortic aneurysm or dissection. 2. Emphysema and chronic bronchitis. Aortic Atherosclerosis (ICD10-I70.0) and Emphysema (ICD10-J43.9). Electronically Signed   By: Rozell Cornet M.D.   On: 05/09/2023 00:15   CT ABDOMEN PELVIS W CONTRAST Result Date: 05/08/2023 CLINICAL DATA:  Left lower quadrant pain EXAM: CT ABDOMEN AND PELVIS WITH CONTRAST TECHNIQUE: Multidetector CT imaging of the abdomen and pelvis was performed using the standard protocol following bolus administration of intravenous contrast. RADIATION DOSE REDUCTION: This exam was performed according to the departmental dose-optimization program which includes automated exposure control, adjustment of the mA and/or kV according to patient size and/or use of iterative reconstruction technique. CONTRAST:  50mL OMNIPAQUE IOHEXOL 350 MG/ML SOLN COMPARISON:  CT 10/30/2015 FINDINGS: Lower chest: Lung bases demonstrate bandlike scarring within the lower lobes and right middle lobe. No acute airspace disease. Hepatobiliary: Subcentimeter hypodensities in the liver too small to further characterize. No calcified gallstone or biliary dilatation. Pancreas: Unremarkable. No pancreatic ductal dilatation or surrounding inflammatory changes. Spleen: Normal in size without focal abnormality. Adrenals/Urinary  Tract: Adrenal glands show no focal abnormality. Kidneys show no hydronephrosis. Simple bilateral renal cysts for which no imaging follow-up is recommended. 2.1 cm superior pole exophytic complex cyst. Bladder partially obscured Stomach/Bowel: Stomach nonenlarged. No dilated small bowel. No acute bowel wall thickening. Diverticular disease of the left colon. Vascular/Lymphatic: Advanced aortic atherosclerosis. No aneurysm. Mildly enlarged left Peri aortic node measuring 19 mm on series 3, image 42. Reproductive: Uterus is unremarkable. Heterogenous left adnexal mass lesion measuring 6.8 x 5.8 by 7 cm concerning for neoplasm. Other: Small volume free fluid within the pelvis. Evidence for peritoneal metastatic disease. 10 mm left upper quadrant peritoneal nodule on series 3, image 35. Right mid quadrant soft tissue mass measuring 5.7 x 2.4 cm on series 3, image 44. Left lower quadrant soft tissue mass measuring 8.1 by 2.5 by 12.1 cm. Soft tissue nodule in the right colic gutter measuring 1.7 cm on series 3, image 55. Right posterior pelvic soft tissue mass measuring 3.6 x 2.6 cm on series 3, image 76. Additional small mesenteric nodules are noted. Musculoskeletal: No acute or suspicious osseous abnormality. Left hip replacement with artifact. IMPRESSION: 1. Large heterogeneous cystic and solid left adnexal mass highly suspicious for ovarian neoplasm. 2. Positive for peritoneal metastatic disease as evidence by multiple intra-abdominal and pelvic tumor implants. Trace volume ascites in the pelvis. Several mildly enlarged retroperitoneal lymph nodes also suspicious for metastatic disease. 3. Diverticular disease of the left colon without acute inflammatory process. 4. Aortic atherosclerosis. Aortic Atherosclerosis (ICD10-I70.0). Electronically Signed   By: Esmeralda Hedge M.D.   On: 05/08/2023 19:53   DG Chest 2 View Result Date: 04/27/2023 CLINICAL DATA:  Cough.  Chills and congestion. EXAM: CHEST - 2 VIEW  COMPARISON:  03/15/2020. FINDINGS: Redemonstration of irregular opacity overlying the right lateral costophrenic angle, which corresponds to area of scarring/atelectasis on the prior CT scan from 05/15/2022. Bilateral lungs are otherwise clear. No acute consolidation or lung collapse. Bilateral costophrenic angles are clear. Normal cardio-mediastinal silhouette. No acute osseous abnormalities. The soft tissues are within normal limits. IMPRESSION: No active cardiopulmonary disease. Electronically Signed   By: Beula Brunswick M.D.   On: 04/27/2023 15:46      05/09/2023 Tumor Marker   Patient's tumor was tested for the following markers: CA-125. Results of the tumor marker test revealed 137.   05/10/2023 Pathology Results   SURGICAL PATHOLOGY  CASE: [id number]  PATIENT: Loella Calico  Surgical Pathology Report   Clinical History: concern for metastatic ovarian cancer (cm)   FINAL MICROSCOPIC DIAGNOSIS:   A. OMENTUM, MASS WITHIN LEFT MID ABDOMEN, NEEDLE CORE BIOPSY:  - Metastatic carcinoma with a gynecologic primary, see comment  Immunohistochemical stain show that the tumor cells are positive for CK7 and PAX8 while they are negative for CK20 and GATA3 with above interpretation.  Dr. LeGolvan reviewed the case and concurs with the above diagnosis.    05/14/2023 Cancer Staging   Staging form: Ovary, Fallopian Tube, and Primary Peritoneal Carcinoma, AJCC 8th Edition - Clinical stage from 05/14/2023: FIGO Stage IIIC (cT3c, cN1, cM0) - Signed by Almeda Jacobs, MD on 05/14/2023 Stage prefix: Initial diagnosis   05/25/2023 -  Chemotherapy   Patient is on Treatment Plan : OVARIAN Carboplatin (AUC 6) + Paclitaxel (175) q21d X 6 Cycles     07/11/2023 Imaging   CT ABDOMEN PELVIS W CONTRAST Result Date: 07/23/2023 CLINICAL DATA:  Ovarian cancer, assess treatment response. * Tracking Code: BO * EXAM: CT ABDOMEN AND PELVIS WITH CONTRAST TECHNIQUE: Multidetector CT imaging of the abdomen and pelvis was  performed using the standard protocol following bolus administration of intravenous contrast. RADIATION DOSE REDUCTION: This exam was performed according to the departmental dose-optimization program which includes automated exposure control, adjustment of the mA and/or kV according to patient size and/or use of iterative reconstruction technique. CONTRAST:  OMNIPAQUE IOHEXOL 300 MG/ML  SOLN COMPARISON:  Abdominopelvic CT 05/08/2023 and 10/30/2015 FINDINGS: Lower chest: Similar bandlike scarring at both lung bases. No confluent airspace disease, suspicious nodularity or pleural effusion. Aortic and coronary artery atherosclerosis noted. Hepatobiliary: The liver is normal in density without suspicious focal abnormality. Stable small low-density lesions inferiorly in the right hepatic lobe, likely cysts. No evidence of gallstones, gallbladder wall thickening or biliary dilatation. Pancreas: Unremarkable. No pancreatic ductal dilatation or surrounding inflammatory changes. Spleen: Normal in size without focal abnormality. Adrenals/Urinary Tract: Stable mild chronic enlargement of both adrenal glands, unchanged from 2017, consistent with mild hyperplasia. No evidence of urinary tract calculus, suspicious renal lesion or hydronephrosis. Stable bilateral renal cysts for which no specific follow-up imaging is recommended. The urinary bladder is largely obscured by artifact from the left total hip arthroplasty. No bladder abnormality identified. Stomach/Bowel: Enteric contrast has passed into the distal small bowel. The stomach appears unremarkable for its degree of distension. No evidence of bowel wall thickening, distention or surrounding inflammatory change. Prominent stool throughout the colon, especially within the rectum. Mild distal colonic diverticulosis. Vascular/Lymphatic: Interval resolution of previously demonstrated left periaortic adenopathy. No enlarged abdominal or pelvic lymph nodes are identified.  Diffuse aortic and branch vessel atherosclerosis without evidence of aneurysm or large vessel occlusion. Reproductive: Interval decreased size of previously demonstrated left adnexal mass, now measuring 4.7 x 4.1 cm on image 61/2 (previously 6.8 x 5.8 cm). No right adnexal mass. The uterus appears unremarkable. Other: Previously demonstrated omental nodularity has substantially improved in the interval. No significant residual measurable omental disease demonstrated. There is no significant ascites. A right perirectal mass measuring 2.5 x 1.7 cm on image 75/2 previously measured up to 3.6 cm. No progressive peritoneal disease identified. Musculoskeletal: No acute or significant osseous findings. Multilevel spondylosis. Previous left total hip arthroplasty with moderate right hip degenerative changes. IMPRESSION: 1. 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 No evidence of progressive disease. 2. Interval resolution of previously demonstrated left periaortic adenopathy. 3. No acute findings  or evidence of progressive disease. 4.  Aortic Atherosclerosis (ICD10-I70.0). Electronically Signed   By: Carey Bullocks M.D.   On: 07/23/2023 11:31        Interval History: Patient presents today with her sisters.  Reports that she has tolerated chemo well.  Denies any numbness.  Only had nausea with the first cycle and no nausea since.  Having a bowel movement about every other day.  Has Dulcolax at home and can take it.  Did present on 08/03/2023 for repeat labs and was transfused 1 unit of blood given anemia.     Past Medical/Surgical History: Past Medical History:  Diagnosis Date   Cancer (HCC) 03/2018   right breast cancer   Chronic kidney disease    CKD   COPD (chronic obstructive pulmonary disease) (HCC)    DJD (degenerative joint disease)    LEFT HIP   Family history of breast cancer    Family history of ovarian cancer    Family history of  prostate cancer    Family history of prostate cancer    H/O migraine 07/01/2019   Hyperlipidemia    takes Simvasatin daily   Hypertension    takes Propranolol and HCTZ daioly   Hyperthyroidism    Pneumonia 10/2015   PONV (postoperative nausea and vomiting)    when ether used   Reactive airway disease 05/08/2023   Shortness of breath dyspnea    with ambulation   Tremors of nervous system    takes Primidone daily    Past Surgical History:  Procedure Laterality Date   BOne Spur     left heel   BREAST LUMPECTOMY WITH RADIOACTIVE SEED AND SENTINEL LYMPH NODE BIOPSY Right 05/07/2018   Procedure: RIGHT BREAST LUMPECTOMY WITH RADIOACTIVE SEED AND RIGHT  SENTINEL LYMPH NODE MAPPING;  Surgeon: Harriette Bouillon, MD;  Location: [facility];  Service: General;  Laterality: Right;   COLONOSCOPY     IR IMAGING GUIDED PORT INSERTION  05/10/2023   IR US GUIDE BX ASP/DRAIN  05/10/2023   JOINT REPLACEMENT Bilateral    knee   KNEE SURGERY     RE-EXCISION OF BREAST LUMPECTOMY Right 06/05/2018   Procedure: RE-EXCISION OF RIGHT BREAST LUMPECTOMY;  Surgeon: Harriette Bouillon, MD;  Location:  SURGERY CENTER;  Service: General;  Laterality: Right;   ROTATOR CUFF REPAIR Bilateral    TOTAL HIP ARTHROPLASTY Right    TOTAL HIP ARTHROPLASTY Left 12/29/2015   Procedure: TOTAL HIP ARTHROPLASTY ANTERIOR APPROACH;  Surgeon: Loreta Ave, MD;  Location: [facility] OR;  Service: Orthopedics;  Laterality: Left;    Family History  Problem Relation Age of Onset   Hypertension Mother    Heart disease Mother    Stroke Mother    Multiple myeloma Father 24   Thyroid cancer Sister        dx 52's/50's   Cancer Paternal Aunt        unknown   Prostate cancer Paternal Uncle        dx >50- cancer was the cause of his death   Ovarian cancer Paternal Aunt    Throat cancer Paternal Uncle    Breast cancer Cousin        dx >50   Cancer Cousin        type unk   Cancer Cousin        type unk    Cancer Cousin        type unk    Social History   Socioeconomic History  Marital status: Single    Spouse name: Not on file   Number of children: 1   Years of education: 14   Highest education level: Not on file  Occupational History    Comment: retired  Tobacco Use   Smoking status: Every Day    Current packs/day: 0.75    Average packs/day: 0.8 packs/day for 28.0 years (21.0 ttl pk-yrs)    Types: Cigarettes   Smokeless tobacco: Never   Tobacco comments:    02/13/22 Pt stated "I'm trying to quit" Harding Li, CMA   Vaping Use   Vaping status: Never Used  Substance and Sexual Activity   Alcohol use: No    Alcohol/week: 0.0 standard drinks of alcohol   Drug use: No   Sexual activity: Not Currently    Birth control/protection: Post-menopausal  Other Topics Concern   Not on file  Social History Narrative   Lives with handicapped son   Caffeine- coffee, 2 cups daily, 1-2 sodas daily   Social Drivers of Health   Financial Resource Strain: Low Risk  (01/28/2020)   Overall Financial Resource Strain (CARDIA)    Difficulty of Paying Living Expenses: Not very hard  Food Insecurity: No Food Insecurity (05/09/2023)   Hunger Vital Sign    Worried About Running Out of Food in the Last Year: Never true    Ran Out of Food in the Last Year: Never true  Transportation Needs: No Transportation Needs (05/09/2023)   PRAPARE - Administrator, Civil Service (Medical): No    Lack of Transportation (Non-Medical): No  Physical Activity: Not on file  Stress: Not on file  Social Connections: Moderately Integrated (05/09/2023)   Social Connection and Isolation Panel [NHANES]    Frequency of Communication with Friends and Family: More than three times a week    Frequency of Social Gatherings with Friends and Family: Three times a week    Attends Religious Services: More than 4 times per year    Active Member of Clubs or Organizations: Yes    Attends Banker  Meetings: Never    Marital Status: Divorced    Current Medications:  Current Outpatient Medications:    albuterol (VENTOLIN HFA) 108 (90 Base) MCG/ACT inhaler, Inhale 2 puffs into the lungs every 4 (four) hours as needed for wheezing or shortness of breath., Disp: , Rfl:    amLODipine (NORVASC) 10 MG tablet, Take 1 tablet (10 mg total) by mouth daily., Disp: 90 tablet, Rfl: 1   aspirin (ASPIRIN ADULT) 325 MG tablet, enteric coated; may use OTC (Patient taking differently: Take 325 mg by mouth daily. enteric coated; may use OTC), Disp: 30 tablet, Rfl: 0   atorvastatin (LIPITOR) 40 MG tablet, Take 1 tablet (40 mg total) by mouth daily., Disp: 90 tablet, Rfl: 1   bisacodyl (DULCOLAX) 5 MG EC tablet, Take 2 tablets (10 mg total) by mouth daily as needed for moderate constipation., Disp: , Rfl:    Cholecalciferol (VITAMIN D3) 50 MCG (2000 UT) capsule, Take 1 capsule (2,000 Units total) by mouth daily., Disp: 90 capsule, Rfl: 1   dexamethasone (DECADRON) 4 MG tablet, Take 2 tabs at the night before and 2 tab the morning of chemotherapy, every 3 weeks, by mouth x 6 cycles, Disp: 24 tablet, Rfl: 6   levothyroxine (SYNTHROID) 50 MCG tablet, Take 1 tablet (50 mcg total) by mouth daily., Disp: 90 tablet, Rfl: 3   lidocaine-prilocaine (EMLA) cream, Apply to affected area once, Disp: 30 g, Rfl:  3   losartan (COZAAR) 25 MG tablet, Take 25 mg by mouth daily., Disp: , Rfl:    Omega-3 Fatty Acids (FISH OIL PO), Take 1,000 mg by mouth daily., Disp: , Rfl:    ondansetron (ZOFRAN) 8 MG tablet, Take 1 tablet (8 mg total) by mouth every 8 (eight) hours as needed for nausea or vomiting. Start on the third day after carboplatin., Disp: 30 tablet, Rfl: 1   polyethylene glycol (MIRALAX / GLYCOLAX) 17 g packet, Take 34 g by mouth daily., Disp: , Rfl:    primidone (MYSOLINE) 50 MG tablet, Take 2 tablets (100 mg total) by mouth every morning., Disp: 180 tablet, Rfl: 2   prochlorperazine (COMPAZINE) 10 MG tablet, Take 1  tablet (10 mg total) by mouth every 6 (six) hours as needed for nausea or vomiting., Disp: 30 tablet, Rfl: 1   propranolol (INDERAL) 40 MG tablet, Take 0.5 tablets (20 mg total) by mouth 2 (two) times daily. (Patient taking differently: Take 20 mg by mouth daily.), Disp: 90 tablet, Rfl: 1   senna-docusate (SENOKOT-S) 8.6-50 MG tablet, Take 1 tablet by mouth 2 (two) times daily., Disp: , Rfl:    TRELEGY ELLIPTA 100-62.5-25 MCG/ACT AEPB, Inhale 1 puff into the lungs daily., Disp: , Rfl:    VOLTAREN 1 % GEL, APPLY TOPICALLY 4 TIMES DAILY AS NEEDED, Disp: 100 g, Rfl: 0  Review of Symptoms: Complete 10-system review is negative except as above in Interval History.  Physical Exam: LMP 01/24/2000  General: Alert, oriented, no acute distress. HEENT: Normocephalic, atraumatic. Neck symmetric without masses. Sclera anicteric.  Chest: Normal work of breathing. Clear to auscultation bilaterally.   Cardiovascular: Regular rate and rhythm, no murmurs. Abdomen: Soft, nontender.  Normoactive bowel sounds.  No masses appreciated.   Extremities: Grossly normal range of motion.  Warm, well perfused.  No edema bilaterally. Skin: No rashes or lesions noted. Lymphatics: No cervical, supraclavicular, or inguinal adenopathy. GU: Normal appearing external genitalia without erythema, excoriation, or lesions.  Speculum exam reveals atrophic but normal vaginal mucosa and cervix.  Bimanual exam reveals normal cervix and small uterus. Difficult to palpate adnexal due to patient discomfort.  Rectovaginal exam slight firmness on right adjacent to rectum. Exam chaperoned by Kimberly Swaziland, CMA   Laboratory & Radiologic Studies: Lab Results  Component Value Date   CAN125 14.2 07/26/2023   CAN125 137.0 (H) 05/09/2023   CEA1 3.4 05/09/2023   LDH 147 06/23/2019     CT ABDOMEN PELVIS W CONTRAST 07/11/2023  Narrative CLINICAL DATA:  Ovarian cancer, assess treatment response. * Tracking Code: BO *  EXAM: CT ABDOMEN  AND PELVIS WITH CONTRAST  TECHNIQUE: Multidetector CT imaging of the abdomen and pelvis was performed using the standard protocol following bolus administration of intravenous contrast.  RADIATION DOSE REDUCTION: This exam was performed according to the departmental dose-optimization program which includes automated exposure control, adjustment of the mA and/or kV according to patient size and/or use of iterative reconstruction technique.  CONTRAST:  OMNIPAQUE IOHEXOL 300 MG/ML  SOLN  COMPARISON:  Abdominopelvic CT 05/08/2023 and 10/30/2015  FINDINGS: Lower chest: Similar bandlike scarring at both lung bases. No confluent airspace disease, suspicious nodularity or pleural effusion. Aortic and coronary artery atherosclerosis noted.  Hepatobiliary: The liver is normal in density without suspicious focal abnormality. Stable small low-density lesions inferiorly in the right hepatic lobe, likely cysts. No evidence of gallstones, gallbladder wall thickening or biliary dilatation.  Pancreas: Unremarkable. No pancreatic ductal dilatation or surrounding inflammatory changes.  Spleen: Normal  in size without focal abnormality.  Adrenals/Urinary Tract: Stable mild chronic enlargement of both adrenal glands, unchanged from 2017, consistent with mild hyperplasia. No evidence of urinary tract calculus, suspicious renal lesion or hydronephrosis. Stable bilateral renal cysts for which no specific follow-up imaging is recommended. The urinary bladder is largely obscured by artifact from the left total hip arthroplasty. No bladder abnormality identified.  Stomach/Bowel: Enteric contrast has passed into the distal small bowel. The stomach appears unremarkable for its degree of distension. No evidence of bowel wall thickening, distention or surrounding inflammatory change. Prominent stool throughout the colon, especially within the rectum. Mild distal  colonic diverticulosis.  Vascular/Lymphatic: Interval resolution of previously demonstrated left periaortic adenopathy. No enlarged abdominal or pelvic lymph nodes are identified. Diffuse aortic and branch vessel atherosclerosis without evidence of aneurysm or large vessel occlusion.  Reproductive: Interval decreased size of previously demonstrated left adnexal mass, now measuring 4.7 x 4.1 cm on image 61/2 (previously 6.8 x 5.8 cm). No right adnexal mass. The uterus appears unremarkable.  Other: Previously demonstrated omental nodularity has substantially improved in the interval. No significant residual measurable omental disease demonstrated. There is no significant ascites. A right perirectal mass measuring 2.5 x 1.7 cm on image 75/2 previously measured up to 3.6 cm. No progressive peritoneal disease identified.  Musculoskeletal: No acute or significant osseous findings. Multilevel spondylosis. Previous left total hip arthroplasty with moderate right hip degenerative changes.  IMPRESSION: 1. 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 No evidence of progressive disease. 2. Interval resolution of previously demonstrated left periaortic adenopathy. 3. No acute findings or evidence of progressive disease. 4.  Aortic Atherosclerosis (ICD10-I70.0).   Electronically Signed By: Elmon Hagedorn M.D. On: 07/23/2023 11:31

## 2023-08-14 NOTE — Patient Instructions (Signed)
 SURGICAL WAITING ROOM VISITATION Patients having surgery or a procedure may have no more than 2 support people in the waiting area - these visitors may rotate in the visitor waiting room.   If the patient needs to stay at the hospital during part of their recovery, the visitor guidelines for inpatient rooms apply.  PRE-OP VISITATION  Pre-op nurse will coordinate an appropriate time for 1 support person to accompany the patient in pre-op.  This support person may not rotate.  This visitor will be contacted when the time is appropriate for the visitor to come back in the pre-op area.  Please refer to the [facility] website for the visitor guidelines for Inpatients (after your surgery is over and you are in a regular room).  You are not required to quarantine at this time prior to your surgery. However, you must do this: Hand Hygiene often Do NOT share personal items Notify your provider if you are in close contact with someone who has COVID or you develop fever 100.4 or greater, new onset of sneezing, cough, sore throat, shortness of breath or body aches.  If you test positive for Covid or have been in contact with anyone that has tested positive in the last 10 days please notify you surgeon.    Your procedure is scheduled on:  Tuesday  August 21, 2023  Report to [facility] Main Entrance: [address] entrance where the [employer] is available.   Report to admitting at:  10:15   AM  Call this number if you have any questions or problems the morning of surgery [phone]  FOLLOW ANY ADDITIONAL PRE OP INSTRUCTIONS YOU RECEIVED FROM YOUR SURGEON'S OFFICE!!!  Do not eat food after Midnight the night prior to your surgery/procedure.  After Midnight you may have the following liquids until   09:30  AM DAY OF SURGERY  Clear Liquid Diet Water Black Coffee (sugar ok, NO MILK/CREAM OR CREAMERS)  Tea (sugar ok, NO MILK/CREAM OR CREAMERS) regular and decaf                              Plain Jell-O  with no fruit (NO RED)                                           Fruit ices (not with fruit pulp, NO RED)                                     Popsicles (NO RED)                                                                  Juice: NO CITRUS JUICES: only apple, WHITE grape, WHITE cranberry Sports drinks like Gatorade or Powerade (NO RED)           Oral Hygiene is also important to reduce your risk of infection.Remember - BRUSH YOUR TEETH THE MORNING OF SURGERY WITH YOUR REGULAR TOOTHPASTE  Do NOT smoke after Midnight the night before surgery.  Eat a light diet  the day before surgery.  Examples including soups, broths, toast, yogurt, mashed potatoes.  AVOID GAS PRODUCING FOODS. Things to avoid include carbonated beverages (fizzy beverages, sodas), raw fruits and raw vegetables (uncooked), or beans.    STOP TAKING all Vitamins, Herbs and supplements 1 week before your surgery.   Take ONLY these medicines the morning of surgery with A SIP OF WATER: ??? Levothyroxine, propranolol, amlodipine, Primidone, and you may use your Albuterol inhaler if needed.    You may not have any metal on your body including hair pins, jewelry, and body piercing  Do not wear make-up, lotions, powders, perfumes or deodorant  Do not wear nail polish including gel and S&S, artificial / acrylic nails, or any other type of covering on natural nails including finger and toenails. If you have artificial nails, gel coating, etc., that needs to be removed by a nail salon, Please have this removed prior to surgery. Not doing so may mean that your surgery could be cancelled or delayed if the Surgeon or anesthesia staff feels like they are unable to monitor you safely.   Do not shave 48 hours prior to surgery to avoid nicks in your skin which may contribute to postoperative infections.   Contacts, Hearing Aids, dentures or bridgework may not be worn into surgery. DENTURES WILL BE REMOVED PRIOR TO SURGERY PLEASE  DO NOT APPLY "Poly grip" OR ADHESIVES!!!  You may bring a small overnight bag with you on the day of surgery, only pack items that are not valuable. [facility] IS NOT RESPONSIBLE   FOR VALUABLES THAT ARE LOST OR STOLEN.   Patients discharged on the day of surgery will not be allowed to drive home.  Someone NEEDS to stay with you for the first 24 hours after anesthesia.  Do not bring your home medications to the hospital. The Pharmacy will dispense medications listed on your medication list to you during your admission in the Hospital.  Special Instructions: Bring a copy of your healthcare power of attorney and living will documents the day of surgery, if you wish to have them scanned into your [facility] Records- EPIC  Please read over the following fact sheets you were given: IF YOU HAVE QUESTIONS ABOUT YOUR PRE-OP INSTRUCTIONS, PLEASE CALL [phone]   [facility] - Preparing for Surgery Before surgery, you can play an important role.  Because skin is not sterile, your skin needs to be as free of germs as possible.  You can reduce the number of germs on your skin by washing with CHG (chlorahexidine gluconate) soap before surgery.  CHG is an antiseptic cleaner which kills germs and bonds with the skin to continue killing germs even after washing. Please DO NOT use if you have an allergy to CHG or antibacterial soaps.  If your skin becomes reddened/irritated stop using the CHG and inform your nurse when you arrive at Short Stay. Do not shave (including legs and underarms) for at least 48 hours prior to the first CHG shower.  You may shave your face/neck.  Please follow these instructions carefully:  1.  Shower with CHG Soap the night before surgery and the  morning of surgery.  2.  If you choose to wash your hair, wash your hair first as usual with your normal  shampoo.  3.  After you shampoo, rinse your hair and body thoroughly to remove the shampoo.  4.   Use CHG as you would any other liquid soap.  You can apply chg directly to the skin and wash.  Gently with a scrungie or clean washcloth.  5.  Apply the CHG Soap to your body ONLY FROM THE NECK DOWN.   Do not use on face/ open                           Wound or open sores. Avoid contact with eyes, ears mouth and genitals (private parts).                       Wash face,  Genitals (private parts) with your normal soap.             6.  Wash thoroughly, paying special attention to the area where your  surgery  will be performed.  7.  Thoroughly rinse your body with warm water from the neck down.  8.  DO NOT shower/wash with your normal soap after using and rinsing off the CHG Soap.            9.  Pat yourself dry with a clean towel.            10.  Wear clean pajamas.            11.  Place clean sheets on your bed the night of your first shower and do not  sleep with pets.  ON THE DAY OF SURGERY : Do not apply any lotions/deodorants the morning of surgery.  Please wear clean clothes to the hospital/surgery center.     FAILURE TO FOLLOW THESE INSTRUCTIONS MAY RESULT IN THE CANCELLATION OF YOUR SURGERY  PATIENT SIGNATURE_________________________________  NURSE SIGNATURE__________________________________  ________________________________________________________________________

## 2023-08-14 NOTE — Progress Notes (Signed)
 COVID Vaccine received:  []  No [x]  Yes Date of any COVID positive Test in last 90 days:  PCP - Hershell Lose, NP  Cardiologist - Dinah Franco, MD Neurology- [place], MD  Oncology- Almeda Jacobs, MD   Chest x-ray - 04-27-2023  2v  Epic EKG -  08-30-2022  Epic Stress Test - 01-12-2015  Epic ECHO - 02-15-2015  Epic Cardiac Cath -   Bowel Prep - []  No  []   Yes __GYN Diet  Pacemaker / ICD device [x]  No []  Yes   Spinal Cord Stimulator:[x]  No []  Yes       History of Sleep Apnea? [x]  No []  Yes   CPAP used?- [x]  No []  Yes    Does the patient monitor blood sugar?   [x]  N/A   []  No []  Yes  Patient has: []  NO Hx DM   [x]  Pre-DM   []  DM1  []   DM2  Blood Thinner / Instructions:  none Aspirin Instructions:  ASA 325 mg- switch to 81mg  and continue taking until the morning of the DAY BEFORE Surgery.   ERAS Protocol Ordered: []  No  [x]  Yes PRE-SURGERY []  ENSURE  []  G2   [x]  No Drink Ordered Patient is to be NPO after: 09:30  Dental hx: []  Dentures:  []  N/A      []  Bridge or Partial:                   []  Loose or Damaged teeth:   Comments:   Activity level: Patient is able / unable to climb a flight of stairs without difficulty; []  No CP  []  No SOB, but would have ___   Patient can / can not perform ADLs without assistance.   Anesthesia review: current lchemo, CKD, COPD, HTN, PONV, Tremors, Brady cardia, smoker, goiter, Reactive airway disease.   Patient denies shortness of breath, fever, cough and chest pain at PAT appointment.  Patient verbalized understanding and agreement to the Pre-Surgical Instructions that were given to them at this PAT appointment. Patient was also educated of the need to review these PAT instructions again prior to her surgery.I reviewed the appropriate phone numbers to call if they have any and questions or concerns.

## 2023-08-15 ENCOUNTER — Other Ambulatory Visit: Payer: Self-pay

## 2023-08-15 ENCOUNTER — Encounter (HOSPITAL_COMMUNITY)
Admission: RE | Admit: 2023-08-15 | Discharge: 2023-08-15 | Disposition: A | Discharge status: Home / Self Care | Source: Ambulatory Visit | Attending: Psychiatry | Admitting: Psychiatry

## 2023-08-15 ENCOUNTER — Encounter (HOSPITAL_COMMUNITY): Payer: Self-pay

## 2023-08-15 VITALS — BP 144/66 | HR 50 | Temp 98.1°F | Resp 16 | Ht 69.5 in | Wt 143.0 lb

## 2023-08-15 DIAGNOSIS — I10 Essential (primary) hypertension: Secondary | ICD-10-CM | POA: Diagnosis not present

## 2023-08-15 DIAGNOSIS — Z01818 Encounter for other preprocedural examination: Secondary | ICD-10-CM | POA: Insufficient documentation

## 2023-08-15 NOTE — Progress Notes (Addendum)
 CMP 08-03-23 epic  CBC/ diff repeat DOS  T&S  repeat DOS recent transfusion   08-03-23  Pt. Able to climb a flight of stairs without CP or SOB  Cardiologist- LOV  Phyliss Breen PA-C  08-30-22 epic      Anesthesia review - Current chemo  - Last chemo-07-26-23

## 2023-08-20 ENCOUNTER — Telehealth: Payer: Self-pay | Admitting: *Deleted

## 2023-08-20 NOTE — Anesthesia Preprocedure Evaluation (Addendum)
 Anesthesia Evaluation  Patient identified by MRN, date of birth, ID band Patient awake    Reviewed: Allergy & Precautions, NPO status , Patient's Chart, lab work & pertinent test results  History of Anesthesia Complications (+) PONV and history of anesthetic complications  Airway Mallampati: II  TM Distance: >3 FB Neck ROM: Full    Dental no notable dental hx. (+) Missing, Edentulous Upper, Dental Advisory Given,    Pulmonary COPD, Patient abstained from smoking., former smoker   Pulmonary exam normal breath sounds clear to auscultation       Cardiovascular hypertension, Pt. on medications (-) angina (-) Past MI Normal cardiovascular exam Rhythm:Regular Rate:Normal     Neuro/Psych negative neurological ROS  negative psych ROS   GI/Hepatic   Endo/Other  Hypothyroidism    Renal/GU Lab Results      Component                Value               Date                       K                        4.2                 08/13/2023                  CREATININE               0.79                08/13/2023                   Musculoskeletal  (+) Arthritis ,    Abdominal   Peds  Hematology  (+) Blood dyscrasia, anemia Lab Results      Component                Value               Date                      WBC                      2.9 (L)             08/13/2023                HGB                      9.3 (L)             08/13/2023                HCT                      28.6 (L)            08/13/2023                MCV                      76.1 (L)            08/13/2023                PLT  122 (L)             08/13/2023              Anesthesia Other Findings S/P R Breast CA  Reproductive/Obstetrics                             Anesthesia Physical Anesthesia Plan  ASA: 3  Anesthesia Plan: General   Post-op Pain Management: Ketamine  IV* and Ofirmev  IV (intra-op)*   Induction:  Intravenous  PONV Risk Score and Plan: Treatment may vary due to age or medical condition, Ondansetron  and Dexamethasone   Airway Management Planned: Oral ETT  Additional Equipment: None  Intra-op Plan:   Post-operative Plan: Extubation in OR  Informed Consent: I have reviewed the patients History and Physical, chart, labs and discussed the procedure including the risks, benefits and alternatives for the proposed anesthesia with the patient or authorized representative who has indicated his/her understanding and acceptance.     Dental advisory given  Plan Discussed with: CRNA and Surgeon  Anesthesia Plan Comments:        Anesthesia Quick Evaluation

## 2023-08-20 NOTE — Telephone Encounter (Signed)
Telephone call to check on pre-operative status.  Patient compliant with pre-operative instructions.  Reinforced nothing to eat after midnight. Clear liquids until 0930. Patient to arrive at 1030.  No questions or concerns voiced.  Instructed to call for any needs.

## 2023-08-21 ENCOUNTER — Encounter (HOSPITAL_COMMUNITY)
Admission: RE | Disposition: A | Discharge status: Home / Self Care | Payer: Self-pay | Source: Home / Self Care | Attending: Psychiatry

## 2023-08-21 ENCOUNTER — Other Ambulatory Visit: Payer: Self-pay

## 2023-08-21 ENCOUNTER — Inpatient Hospital Stay (HOSPITAL_COMMUNITY): Payer: Self-pay | Admitting: Physician Assistant

## 2023-08-21 ENCOUNTER — Inpatient Hospital Stay (HOSPITAL_COMMUNITY)

## 2023-08-21 ENCOUNTER — Inpatient Hospital Stay (HOSPITAL_COMMUNITY)
Admission: RE | Admit: 2023-08-21 | Discharge: 2023-09-03 | DRG: 742 | Disposition: A | Discharge status: Home / Self Care | Attending: Psychiatry | Admitting: Psychiatry

## 2023-08-21 ENCOUNTER — Encounter (HOSPITAL_COMMUNITY): Payer: Self-pay | Admitting: Psychiatry

## 2023-08-21 DIAGNOSIS — K567 Ileus, unspecified: Secondary | ICD-10-CM | POA: Diagnosis not present

## 2023-08-21 DIAGNOSIS — I10 Essential (primary) hypertension: Secondary | ICD-10-CM

## 2023-08-21 DIAGNOSIS — Z8042 Family history of malignant neoplasm of prostate: Secondary | ICD-10-CM

## 2023-08-21 DIAGNOSIS — K9189 Other postprocedural complications and disorders of digestive system: Secondary | ICD-10-CM

## 2023-08-21 DIAGNOSIS — Z808 Family history of malignant neoplasm of other organs or systems: Secondary | ICD-10-CM

## 2023-08-21 DIAGNOSIS — Z79633 Long term (current) use of mitotic inhibitor: Secondary | ICD-10-CM | POA: Diagnosis not present

## 2023-08-21 DIAGNOSIS — C50311 Malignant neoplasm of lower-inner quadrant of right female breast: Secondary | ICD-10-CM | POA: Diagnosis present

## 2023-08-21 DIAGNOSIS — Z79899 Other long term (current) drug therapy: Secondary | ICD-10-CM

## 2023-08-21 DIAGNOSIS — Z803 Family history of malignant neoplasm of breast: Secondary | ICD-10-CM | POA: Diagnosis not present

## 2023-08-21 DIAGNOSIS — Z96643 Presence of artificial hip joint, bilateral: Secondary | ICD-10-CM | POA: Diagnosis present

## 2023-08-21 DIAGNOSIS — E785 Hyperlipidemia, unspecified: Secondary | ICD-10-CM | POA: Diagnosis present

## 2023-08-21 DIAGNOSIS — Z8041 Family history of malignant neoplasm of ovary: Secondary | ICD-10-CM

## 2023-08-21 DIAGNOSIS — Z7969 Long term (current) use of other immunomodulators and immunosuppressants: Secondary | ICD-10-CM | POA: Diagnosis not present

## 2023-08-21 DIAGNOSIS — C786 Secondary malignant neoplasm of retroperitoneum and peritoneum: Secondary | ICD-10-CM | POA: Diagnosis present

## 2023-08-21 DIAGNOSIS — J4489 Other specified chronic obstructive pulmonary disease: Secondary | ICD-10-CM | POA: Diagnosis present

## 2023-08-21 DIAGNOSIS — F17210 Nicotine dependence, cigarettes, uncomplicated: Secondary | ICD-10-CM | POA: Diagnosis present

## 2023-08-21 DIAGNOSIS — C579 Malignant neoplasm of female genital organ, unspecified: Secondary | ICD-10-CM | POA: Diagnosis present

## 2023-08-21 DIAGNOSIS — R519 Headache, unspecified: Secondary | ICD-10-CM | POA: Diagnosis not present

## 2023-08-21 DIAGNOSIS — Z79890 Hormone replacement therapy (postmenopausal): Secondary | ICD-10-CM

## 2023-08-21 DIAGNOSIS — J449 Chronic obstructive pulmonary disease, unspecified: Secondary | ICD-10-CM | POA: Diagnosis not present

## 2023-08-21 DIAGNOSIS — Z7982 Long term (current) use of aspirin: Secondary | ICD-10-CM | POA: Diagnosis not present

## 2023-08-21 DIAGNOSIS — Z1721 Progesterone receptor positive status: Secondary | ICD-10-CM

## 2023-08-21 DIAGNOSIS — Z170 Estrogen receptor positive status [ER+]: Secondary | ICD-10-CM

## 2023-08-21 DIAGNOSIS — R001 Bradycardia, unspecified: Secondary | ICD-10-CM

## 2023-08-21 DIAGNOSIS — Z8701 Personal history of pneumonia (recurrent): Secondary | ICD-10-CM

## 2023-08-21 DIAGNOSIS — Z885 Allergy status to narcotic agent status: Secondary | ICD-10-CM

## 2023-08-21 DIAGNOSIS — D61818 Other pancytopenia: Secondary | ICD-10-CM | POA: Diagnosis present

## 2023-08-21 DIAGNOSIS — Z01818 Encounter for other preprocedural examination: Secondary | ICD-10-CM

## 2023-08-21 DIAGNOSIS — N189 Chronic kidney disease, unspecified: Secondary | ICD-10-CM | POA: Diagnosis present

## 2023-08-21 DIAGNOSIS — C562 Malignant neoplasm of left ovary: Secondary | ICD-10-CM | POA: Diagnosis present

## 2023-08-21 DIAGNOSIS — E876 Hypokalemia: Secondary | ICD-10-CM

## 2023-08-21 DIAGNOSIS — I129 Hypertensive chronic kidney disease with stage 1 through stage 4 chronic kidney disease, or unspecified chronic kidney disease: Secondary | ICD-10-CM | POA: Diagnosis present

## 2023-08-21 DIAGNOSIS — C569 Malignant neoplasm of unspecified ovary: Principal | ICD-10-CM | POA: Diagnosis present

## 2023-08-21 DIAGNOSIS — Z8249 Family history of ischemic heart disease and other diseases of the circulatory system: Secondary | ICD-10-CM

## 2023-08-21 DIAGNOSIS — C784 Secondary malignant neoplasm of small intestine: Secondary | ICD-10-CM | POA: Diagnosis present

## 2023-08-21 DIAGNOSIS — Z853 Personal history of malignant neoplasm of breast: Secondary | ICD-10-CM

## 2023-08-21 DIAGNOSIS — Z823 Family history of stroke: Secondary | ICD-10-CM | POA: Diagnosis not present

## 2023-08-21 DIAGNOSIS — Z923 Personal history of irradiation: Secondary | ICD-10-CM

## 2023-08-21 DIAGNOSIS — Z807 Family history of other malignant neoplasms of lymphoid, hematopoietic and related tissues: Secondary | ICD-10-CM

## 2023-08-21 HISTORY — PX: BOWEL RESECTION: SHX1257

## 2023-08-21 HISTORY — PX: OMENTECTOMY: SHX5985

## 2023-08-21 HISTORY — PX: ROBOTIC ASSISTED TOTAL HYSTERECTOMY WITH BILATERAL SALPINGO OOPHERECTOMY: SHX6086

## 2023-08-21 LAB — CBC WITH DIFFERENTIAL/PLATELET
Abs Immature Granulocytes: 0.01 10*3/uL (ref 0.00–0.07)
Basophils Absolute: 0 10*3/uL (ref 0.0–0.1)
Basophils Relative: 1 %
Eosinophils Absolute: 0.1 10*3/uL (ref 0.0–0.5)
Eosinophils Relative: 3 %
HCT: 29.4 % — ABNORMAL LOW (ref 36.0–46.0)
Hemoglobin: 8.9 g/dL — ABNORMAL LOW (ref 12.0–15.0)
Immature Granulocytes: 0 %
Lymphocytes Relative: 30 %
Lymphs Abs: 1 10*3/uL (ref 0.7–4.0)
MCH: 24.3 pg — ABNORMAL LOW (ref 26.0–34.0)
MCHC: 30.3 g/dL (ref 30.0–36.0)
MCV: 80.1 fL (ref 80.0–100.0)
Monocytes Absolute: 0.4 10*3/uL (ref 0.1–1.0)
Monocytes Relative: 13 %
Neutro Abs: 1.8 10*3/uL (ref 1.7–7.7)
Neutrophils Relative %: 53 %
Platelets: 93 10*3/uL — ABNORMAL LOW (ref 150–400)
RBC: 3.67 MIL/uL — ABNORMAL LOW (ref 3.87–5.11)
RDW: 18.6 % — ABNORMAL HIGH (ref 11.5–15.5)
Smear Review: DECREASED
WBC: 3.3 10*3/uL — ABNORMAL LOW (ref 4.0–10.5)
nRBC: 0 % (ref 0.0–0.2)

## 2023-08-21 LAB — BASIC METABOLIC PANEL WITH GFR
Anion gap: 8 (ref 5–15)
BUN: 21 mg/dL (ref 8–23)
CO2: 25 mmol/L (ref 22–32)
Calcium: 9.1 mg/dL (ref 8.9–10.3)
Chloride: 106 mmol/L (ref 98–111)
Creatinine, Ser: 0.66 mg/dL (ref 0.44–1.00)
GFR, Estimated: >60 mL/min (ref >60)
Glucose, Bld: 86 mg/dL (ref 70–99)
Potassium: 4.6 mmol/L (ref 3.5–5.1)
Sodium: 139 mmol/L (ref 135–145)

## 2023-08-21 LAB — TYPE AND SCREEN
ABO/RH(D): O POS
Antibody Screen: NEGATIVE

## 2023-08-21 LAB — POCT I-STAT EG7
Acid-Base Excess: 1 mmol/L (ref 0.0–2.0)
Bicarbonate: 25.9 mmol/L (ref 20.0–28.0)
Calcium, Ion: 1.25 mmol/L (ref 1.15–1.40)
HCT: 27 % — ABNORMAL LOW (ref 36.0–46.0)
Hemoglobin: 9.2 g/dL — ABNORMAL LOW (ref 12.0–15.0)
O2 Saturation: 37 %
Potassium: 4.6 mmol/L (ref 3.5–5.1)
Sodium: 139 mmol/L (ref 135–145)
TCO2: 27 mmol/L (ref 22–32)
pCO2, Ven: 42.5 mmHg — ABNORMAL LOW (ref 44–60)
pH, Ven: 7.393 (ref 7.25–7.43)
pO2, Ven: 22 mmHg — CL (ref 32–45)

## 2023-08-21 SURGERY — HYSTERECTOMY, TOTAL, ROBOT-ASSISTED, LAPAROSCOPIC, WITH BILATERAL SALPINGO-OOPHORECTOMY
Anesthesia: General

## 2023-08-21 MED ORDER — ALBUTEROL SULFATE HFA 108 (90 BASE) MCG/ACT IN AERS: 2.0000 | INHALATION_SPRAY | RESPIRATORY_TRACT | Status: DC | PRN

## 2023-08-21 MED ORDER — LACTATED RINGERS IR SOLN
Status: DC | PRN
  Administered 2023-08-21: 1000 mL

## 2023-08-21 MED ORDER — DEXAMETHASONE SODIUM PHOSPHATE 10 MG/ML IJ SOLN
INTRAMUSCULAR | Status: AC
  Filled 2023-08-21: qty 1

## 2023-08-21 MED ORDER — ACETAMINOPHEN 10 MG/ML IV SOLN
INTRAVENOUS | Status: AC
  Filled 2023-08-21: qty 100

## 2023-08-21 MED ORDER — BUPIVACAINE HCL (PF) 0.25 % IJ SOLN
INTRAMUSCULAR | Status: AC
  Filled 2023-08-21: qty 30

## 2023-08-21 MED ORDER — TRAMADOL HCL 50 MG PO TABS
100.0000 mg | ORAL_TABLET | Freq: Two times a day (BID) | ORAL | Status: DC | PRN
  Administered 2023-08-21 – 2023-08-25 (×4): 100 mg via ORAL
  Filled 2023-08-21 (×4): qty 2

## 2023-08-21 MED ORDER — ACETAMINOPHEN 10 MG/ML IV SOLN
INTRAVENOUS | Status: DC | PRN
Start: 2023-08-21 — End: 2023-08-21
  Administered 2023-08-21: 1000 mg via INTRAVENOUS

## 2023-08-21 MED ORDER — ACETAMINOPHEN 500 MG PO TABS
1000.0000 mg | ORAL_TABLET | Freq: Two times a day (BID) | ORAL | Status: DC
  Administered 2023-08-22 – 2023-08-25 (×7): 1000 mg via ORAL
  Filled 2023-08-21 (×7): qty 2

## 2023-08-21 MED ORDER — OXYCODONE HCL 5 MG PO TABS: 5.0000 mg | ORAL_TABLET | Freq: Once | ORAL | Status: DC | PRN

## 2023-08-21 MED ORDER — HYDROMORPHONE HCL 1 MG/ML IJ SOLN
0.2500 mg | INTRAMUSCULAR | Status: DC | PRN
  Administered 2023-08-21 (×2): 0.25 mg via INTRAVENOUS

## 2023-08-21 MED ORDER — SENNOSIDES-DOCUSATE SODIUM 8.6-50 MG PO TABS
2.0000 | ORAL_TABLET | Freq: Every day | ORAL | Status: DC
Start: 2023-08-21 — End: 2023-08-26
  Administered 2023-08-21 – 2023-08-24 (×4): 2 via ORAL
  Filled 2023-08-21 (×4): qty 2

## 2023-08-21 MED ORDER — ROCURONIUM BROMIDE 10 MG/ML (PF) SYRINGE
PREFILLED_SYRINGE | INTRAVENOUS | Status: AC
  Filled 2023-08-21: qty 10

## 2023-08-21 MED ORDER — ACETAMINOPHEN 10 MG/ML IV SOLN: 1000.0000 mg | Freq: Once | INTRAVENOUS | Status: DC | PRN

## 2023-08-21 MED ORDER — CHLORHEXIDINE GLUCONATE 0.12 % MT SOLN
15.0000 mL | Freq: Once | OROMUCOSAL | Status: AC
  Administered 2023-08-21: 15 mL via OROMUCOSAL

## 2023-08-21 MED ORDER — SODIUM CHLORIDE 0.9% FLUSH
3.0000 mL | INTRAVENOUS | Status: DC | PRN
Start: 2023-08-21 — End: 2023-09-03

## 2023-08-21 MED ORDER — KETAMINE HCL 50 MG/5ML IJ SOSY
PREFILLED_SYRINGE | INTRAMUSCULAR | Status: DC | PRN
  Administered 2023-08-21 (×2): 10 mg via INTRAVENOUS

## 2023-08-21 MED ORDER — ONDANSETRON HCL 4 MG PO TABS
4.0000 mg | ORAL_TABLET | Freq: Four times a day (QID) | ORAL | Status: DC | PRN
Start: 2023-08-21 — End: 2023-09-03
  Filled 2023-08-21: qty 1

## 2023-08-21 MED ORDER — ORAL CARE MOUTH RINSE: 15.0000 mL | Freq: Once | OROMUCOSAL | Status: AC

## 2023-08-21 MED ORDER — SODIUM CHLORIDE (PF) 0.9 % IJ SOLN
INTRAMUSCULAR | Status: DC | PRN
  Administered 2023-08-21: 50 mL

## 2023-08-21 MED ORDER — CHLORHEXIDINE GLUCONATE CLOTH 2 % EX PADS
6.0000 | MEDICATED_PAD | Freq: Every day | CUTANEOUS | Status: DC
  Administered 2023-08-22 – 2023-09-03 (×13): 6 via TOPICAL

## 2023-08-21 MED ORDER — ONDANSETRON HCL 4 MG/2ML IJ SOLN
INTRAMUSCULAR | Status: DC | PRN
  Administered 2023-08-21: 4 mg via INTRAVENOUS

## 2023-08-21 MED ORDER — ONDANSETRON HCL 4 MG/2ML IJ SOLN
INTRAMUSCULAR | Status: AC
  Filled 2023-08-21: qty 2

## 2023-08-21 MED ORDER — PROPOFOL 10 MG/ML IV BOLUS
INTRAVENOUS | Status: AC
  Filled 2023-08-21: qty 20

## 2023-08-21 MED ORDER — ENOXAPARIN SODIUM 40 MG/0.4ML IJ SOSY: 40.0000 mg | PREFILLED_SYRINGE | INTRAMUSCULAR | Status: DC

## 2023-08-21 MED ORDER — CEFAZOLIN SODIUM 1 G IJ SOLR
INTRAMUSCULAR | Status: AC
  Filled 2023-08-21: qty 10

## 2023-08-21 MED ORDER — CEFAZOLIN SODIUM-DEXTROSE 2-4 GM/100ML-% IV SOLN
2.0000 g | INTRAVENOUS | Status: AC
  Administered 2023-08-21 (×2): 2 g via INTRAVENOUS
  Filled 2023-08-21: qty 100

## 2023-08-21 MED ORDER — ROCURONIUM BROMIDE 100 MG/10ML IV SOLN
INTRAVENOUS | Status: DC | PRN
  Administered 2023-08-21 (×2): 30 mg via INTRAVENOUS
  Administered 2023-08-21: 50 mg via INTRAVENOUS
  Administered 2023-08-21: 15 mg via INTRAVENOUS
  Administered 2023-08-21 (×2): 20 mg via INTRAVENOUS
  Administered 2023-08-21: 10 mg via INTRAVENOUS

## 2023-08-21 MED ORDER — ONDANSETRON HCL 4 MG/2ML IJ SOLN: 4.0000 mg | Freq: Once | INTRAMUSCULAR | Status: DC | PRN

## 2023-08-21 MED ORDER — FENTANYL CITRATE (PF) 250 MCG/5ML IJ SOLN
INTRAMUSCULAR | Status: AC
  Filled 2023-08-21: qty 5

## 2023-08-21 MED ORDER — ATORVASTATIN CALCIUM 20 MG PO TABS
40.0000 mg | ORAL_TABLET | Freq: Every day | ORAL | Status: DC
  Administered 2023-08-22 – 2023-08-24 (×3): 40 mg via ORAL
  Filled 2023-08-21 (×4): qty 2

## 2023-08-21 MED ORDER — ACETAMINOPHEN 500 MG PO TABS
1000.0000 mg | ORAL_TABLET | ORAL | Status: AC
  Administered 2023-08-21: 1000 mg via ORAL
  Filled 2023-08-21: qty 2

## 2023-08-21 MED ORDER — LIDOCAINE HCL (PF) 2 % IJ SOLN
INTRAMUSCULAR | Status: DC | PRN
  Administered 2023-08-21: 1.5 mg/kg/h via INTRADERMAL

## 2023-08-21 MED ORDER — DEXAMETHASONE SODIUM PHOSPHATE 10 MG/ML IJ SOLN
4.0000 mg | INTRAMUSCULAR | Status: AC
  Administered 2023-08-21: 5 mg via INTRAVENOUS

## 2023-08-21 MED ORDER — SODIUM CHLORIDE 0.9% FLUSH
3.0000 mL | Freq: Two times a day (BID) | INTRAVENOUS | Status: DC
  Administered 2023-08-21: 5 mL via INTRAVENOUS
  Administered 2023-08-22 – 2023-08-24 (×5): 3 mL via INTRAVENOUS
  Administered 2023-08-24: 5 mL via INTRAVENOUS
  Administered 2023-08-25: 10 mL via INTRAVENOUS
  Administered 2023-08-25: 3 mL via INTRAVENOUS
  Administered 2023-08-26: 10 mL via INTRAVENOUS
  Administered 2023-08-27: 3 mL via INTRAVENOUS
  Administered 2023-08-30 (×2): 10 mL via INTRAVENOUS
  Administered 2023-08-31: 3 mL via INTRAVENOUS
  Administered 2023-08-31 – 2023-09-01 (×2): 10 mL via INTRAVENOUS
  Administered 2023-09-02: 3 mL via INTRAVENOUS
  Administered 2023-09-03: 10 mL via INTRAVENOUS

## 2023-08-21 MED ORDER — ALBUMIN HUMAN 5 % IV SOLN
INTRAVENOUS | Status: AC
  Filled 2023-08-21: qty 250

## 2023-08-21 MED ORDER — LEVOTHYROXINE SODIUM 50 MCG PO TABS
50.0000 ug | ORAL_TABLET | Freq: Every day | ORAL | Status: DC
  Administered 2023-08-22 – 2023-08-25 (×4): 50 ug via ORAL
  Filled 2023-08-21 (×4): qty 1

## 2023-08-21 MED ORDER — PRIMIDONE 50 MG PO TABS
100.0000 mg | ORAL_TABLET | Freq: Every morning | ORAL | Status: DC
  Administered 2023-08-22 – 2023-08-24 (×3): 100 mg via ORAL
  Filled 2023-08-21 (×5): qty 2

## 2023-08-21 MED ORDER — ONDANSETRON HCL 4 MG/2ML IJ SOLN
4.0000 mg | Freq: Four times a day (QID) | INTRAMUSCULAR | Status: DC | PRN
Start: 2023-08-21 — End: 2023-09-03
  Administered 2023-08-25 – 2023-08-26 (×2): 4 mg via INTRAVENOUS
  Filled 2023-08-21 (×2): qty 2

## 2023-08-21 MED ORDER — PROPOFOL 10 MG/ML IV BOLUS
INTRAVENOUS | Status: DC | PRN
  Administered 2023-08-21: 100 mg via INTRAVENOUS

## 2023-08-21 MED ORDER — PHENYLEPHRINE HCL-NACL 20-0.9 MG/250ML-% IV SOLN
INTRAVENOUS | Status: AC
Start: 2023-08-21 — End: 2023-08-22
  Filled 2023-08-21: qty 250

## 2023-08-21 MED ORDER — LACTATED RINGERS IV SOLN: INTRAVENOUS | Status: DC

## 2023-08-21 MED ORDER — HYDROMORPHONE HCL 1 MG/ML IJ SOLN
INTRAMUSCULAR | Status: AC
  Filled 2023-08-21: qty 1

## 2023-08-21 MED ORDER — BUPIVACAINE HCL 0.25 % IJ SOLN
INTRAMUSCULAR | Status: DC | PRN
  Administered 2023-08-21: 28 mL

## 2023-08-21 MED ORDER — KETAMINE HCL 50 MG/5ML IJ SOSY
PREFILLED_SYRINGE | INTRAMUSCULAR | Status: AC
  Filled 2023-08-21: qty 5

## 2023-08-21 MED ORDER — ALBUTEROL SULFATE (2.5 MG/3ML) 0.083% IN NEBU: 2.5000 mg | INHALATION_SOLUTION | Freq: Four times a day (QID) | RESPIRATORY_TRACT | Status: DC | PRN

## 2023-08-21 MED ORDER — METRONIDAZOLE 500 MG/100ML IV SOLN
500.0000 mg | INTRAVENOUS | Status: AC
  Administered 2023-08-21: 500 mg via INTRAVENOUS
  Filled 2023-08-21: qty 100

## 2023-08-21 MED ORDER — ALBUMIN HUMAN 5 % IV SOLN: INTRAVENOUS | Status: DC | PRN

## 2023-08-21 MED ORDER — HYDROMORPHONE HCL 1 MG/ML IJ SOLN
0.5000 mg | INTRAMUSCULAR | Status: DC | PRN
  Administered 2023-08-22 – 2023-08-29 (×15): 0.5 mg via INTRAVENOUS
  Filled 2023-08-21 (×15): qty 0.5

## 2023-08-21 MED ORDER — BUPIVACAINE LIPOSOME 1.3 % IJ SUSP
INTRAMUSCULAR | Status: AC
  Filled 2023-08-21: qty 20

## 2023-08-21 MED ORDER — SODIUM CHLORIDE 0.9% FLUSH: 10.0000 mL | INTRAVENOUS | Status: DC | PRN

## 2023-08-21 MED ORDER — FENTANYL CITRATE (PF) 100 MCG/2ML IJ SOLN
INTRAMUSCULAR | Status: DC | PRN
  Administered 2023-08-21 (×2): 50 ug via INTRAVENOUS
  Administered 2023-08-21: 25 ug via INTRAVENOUS
  Administered 2023-08-21 (×2): 50 ug via INTRAVENOUS
  Administered 2023-08-21: 25 ug via INTRAVENOUS

## 2023-08-21 MED ORDER — LIDOCAINE HCL (PF) 2 % IJ SOLN
INTRAMUSCULAR | Status: AC
  Filled 2023-08-21: qty 5

## 2023-08-21 MED ORDER — SUGAMMADEX SODIUM 200 MG/2ML IV SOLN
INTRAVENOUS | Status: DC | PRN
  Administered 2023-08-21: 200 mg via INTRAVENOUS

## 2023-08-21 MED ORDER — IBUPROFEN 400 MG PO TABS
600.0000 mg | ORAL_TABLET | Freq: Four times a day (QID) | ORAL | Status: DC
  Administered 2023-08-22 – 2023-08-24 (×7): 600 mg via ORAL
  Filled 2023-08-21 (×6): qty 1

## 2023-08-21 MED ORDER — LIDOCAINE HCL (CARDIAC) PF 100 MG/5ML IV SOSY
PREFILLED_SYRINGE | INTRAVENOUS | Status: DC | PRN
  Administered 2023-08-21: 80 mg via INTRAVENOUS

## 2023-08-21 MED ORDER — PROPRANOLOL HCL 10 MG PO TABS
20.0000 mg | ORAL_TABLET | Freq: Two times a day (BID) | ORAL | Status: DC
Start: 2023-08-21 — End: 2023-08-26
  Administered 2023-08-21 – 2023-08-24 (×7): 20 mg via ORAL
  Filled 2023-08-21 (×8): qty 2

## 2023-08-21 MED ORDER — LIDOCAINE HCL 2 % IJ SOLN
INTRAMUSCULAR | Status: AC
  Filled 2023-08-21: qty 40

## 2023-08-21 MED ORDER — OXYCODONE HCL 5 MG/5ML PO SOLN: 5.0000 mg | Freq: Once | ORAL | Status: DC | PRN

## 2023-08-21 MED ORDER — LACTATED RINGERS IV SOLN: INTRAVENOUS | Status: DC | PRN

## 2023-08-21 MED ORDER — HEPARIN SODIUM (PORCINE) 5000 UNIT/ML IJ SOLN
5000.0000 [IU] | INTRAMUSCULAR | Status: AC
  Administered 2023-08-21: 5000 [IU] via SUBCUTANEOUS
  Filled 2023-08-21: qty 1

## 2023-08-21 MED ORDER — AMLODIPINE BESYLATE 10 MG PO TABS
10.0000 mg | ORAL_TABLET | Freq: Every day | ORAL | Status: DC
Start: 2023-08-22 — End: 2023-08-26
  Administered 2023-08-22 – 2023-08-24 (×3): 10 mg via ORAL
  Filled 2023-08-21 (×4): qty 1

## 2023-08-21 SURGICAL SUPPLY — 77 items
APPLICATOR SURGIFLO ENDO (HEMOSTASIS) IMPLANT
BAG LAPAROSCOPIC 12 15 PORT 16 (BASKET) IMPLANT
BLADE SURG SZ10 CARB STEEL (BLADE) IMPLANT
CELLS DAT CNTRL 66122 CELL SVR (MISCELLANEOUS) ×1 IMPLANT
COVER BACK TABLE 60X90IN (DRAPES) ×1 IMPLANT
COVER TIP SHEARS 8 DVNC (MISCELLANEOUS) ×1 IMPLANT
DERMABOND ADVANCED .7 DNX12 (GAUZE/BANDAGES/DRESSINGS) ×1 IMPLANT
DRAPE ARM DVNC X/XI (DISPOSABLE) ×4 IMPLANT
DRAPE COLUMN DVNC XI (DISPOSABLE) ×1 IMPLANT
DRAPE SHEET LG 3/4 BI-LAMINATE (DRAPES) ×1 IMPLANT
DRAPE SURG IRRIG POUCH 19X23 (DRAPES) ×1 IMPLANT
DRIVER NDL MEGA SUTCUT DVNCXI (INSTRUMENTS) ×1 IMPLANT
DRIVER NDLE MEGA SUTCUT DVNCXI (INSTRUMENTS) ×1 IMPLANT
DRSG OPSITE POSTOP 4X6 (GAUZE/BANDAGES/DRESSINGS) IMPLANT
DRSG OPSITE POSTOP 4X8 (GAUZE/BANDAGES/DRESSINGS) IMPLANT
ELECT PENCIL ROCKER SW 15FT (MISCELLANEOUS) IMPLANT
ELECT REM PT RETURN 15FT ADLT (MISCELLANEOUS) ×1 IMPLANT
FORCEPS BPLR FENES DVNC XI (FORCEP) ×1 IMPLANT
FORCEPS PROGRASP DVNC XI (FORCEP) ×1 IMPLANT
GAUZE 4X4 16PLY ~~LOC~~+RFID DBL (SPONGE) ×1 IMPLANT
GLOVE BIO SURGEON STRL SZ 6.5 (GLOVE) ×1 IMPLANT
GLOVE BIOGEL PI IND STRL 6.5 (GLOVE) ×2 IMPLANT
GLOVE BIOGEL PI MICRO STRL 6 (GLOVE) ×4 IMPLANT
GOWN STRL REUS W/ TWL LRG LVL3 (GOWN DISPOSABLE) ×4 IMPLANT
GRASPER SUT TROCAR 14GX15 (MISCELLANEOUS) IMPLANT
HOLDER FOLEY CATH W/STRAP (MISCELLANEOUS) IMPLANT
IRRIGATION SUCT STRKRFLW 2 WTP (MISCELLANEOUS) ×1 IMPLANT
KIT PROCEDURE DVNC SI (MISCELLANEOUS) IMPLANT
KIT TURNOVER KIT A (KITS) IMPLANT
LIGASURE BLUNT TIP 5 LONG 44CM (ELECTROSURGICAL) IMPLANT
LIGASURE IMPACT 36 18CM CVD LR (INSTRUMENTS) IMPLANT
MANIPULATOR ADVINCU DEL 3.0 PL (MISCELLANEOUS) IMPLANT
MANIPULATOR ADVINCU DEL 3.5 PL (MISCELLANEOUS) IMPLANT
MANIPULATOR UTERINE 4.5 ZUMI (MISCELLANEOUS) IMPLANT
NDL HYPO 21X1.5 SAFETY (NEEDLE) ×1 IMPLANT
NDL INSUFFLATION 14GA 120MM (NEEDLE) IMPLANT
NDL SPNL 20GX3.5 QUINCKE YW (NEEDLE) IMPLANT
NEEDLE HYPO 21X1.5 SAFETY (NEEDLE) ×1 IMPLANT
NEEDLE INSUFFLATION 14GA 120MM (NEEDLE) IMPLANT
NEEDLE SPNL 20GX3.5 QUINCKE YW (NEEDLE) IMPLANT
OBTURATOR OPTICALSTD 8 DVNC (TROCAR) ×1 IMPLANT
PACK ROBOT GYN CUSTOM WL (TRAY / TRAY PROCEDURE) ×1 IMPLANT
PAD ARMBOARD POSITIONER FOAM (MISCELLANEOUS) ×1 IMPLANT
PAD POSITIONING PINK XL (MISCELLANEOUS) ×1 IMPLANT
PORT ACCESS TROCAR AIRSEAL 12 (TROCAR) IMPLANT
RELOAD PROXIMATE 75MM BLUE (ENDOMECHANICALS) ×2 IMPLANT
RELOAD STAPLE 60 3.5 BLU DVNC (STAPLE) IMPLANT
RELOAD STAPLE 75 3.8 BLU REG (ENDOMECHANICALS) IMPLANT
RELOAD STAPLER 3.5X60 BLU DVNC (STAPLE) ×2 IMPLANT
RETRACTOR WND ALEXIS 18 MED (MISCELLANEOUS) IMPLANT
SCISSORS LAP 5X35 DISP (ENDOMECHANICALS) IMPLANT
SCISSORS MNPLR CVD DVNC XI (INSTRUMENTS) ×1 IMPLANT
SCRUB CHG 4% DYNA-HEX 4OZ (MISCELLANEOUS) ×2 IMPLANT
SEAL UNIV 5-12 XI (MISCELLANEOUS) ×4 IMPLANT
SET TRI-LUMEN FLTR TB AIRSEAL (TUBING) ×1 IMPLANT
SPIKE FLUID TRANSFER (MISCELLANEOUS) ×1 IMPLANT
SPONGE T-LAP 18X18 ~~LOC~~+RFID (SPONGE) IMPLANT
STAPLER 60 SUREFORM DVNC (STAPLE) IMPLANT
STAPLER PROXIMATE 75MM BLUE (STAPLE) IMPLANT
SURGIFLO W/THROMBIN 8M KIT (HEMOSTASIS) IMPLANT
SUT MNCRL AB 4-0 PS2 18 (SUTURE) IMPLANT
SUT PDS AB 1 TP1 54 (SUTURE) IMPLANT
SUT PDS AB 1 TP1 96 (SUTURE) IMPLANT
SUT VIC AB 0 CT1 27XBRD ANTBC (SUTURE) IMPLANT
SUT VIC AB 2-0 CT1 TAPERPNT 27 (SUTURE) IMPLANT
SUT VIC AB 3-0 SH 18 (SUTURE) IMPLANT
SUT VIC AB 4-0 PS2 18 (SUTURE) ×2 IMPLANT
SUT VICRYL 0 27 CT2 27 ABS (SUTURE) ×1 IMPLANT
SYR 10ML LL (SYRINGE) IMPLANT
SYSTEM BAG RETRIEVAL 10MM (BASKET) IMPLANT
SYSTEM WOUND ALEXIS 18CM MED (MISCELLANEOUS) IMPLANT
TRAP SPECIMEN MUCUS 40CC (MISCELLANEOUS) IMPLANT
TRAY FOLEY MTR SLVR 16FR STAT (SET/KITS/TRAYS/PACK) ×1 IMPLANT
TROCAR PORT AIRSEAL 5X120 (TROCAR) IMPLANT
UNDERPAD 30X36 HEAVY ABSORB (UNDERPADS AND DIAPERS) ×2 IMPLANT
WATER STERILE IRR 1000ML POUR (IV SOLUTION) ×1 IMPLANT
YANKAUER SUCT BULB TIP 10FT TU (MISCELLANEOUS) IMPLANT

## 2023-08-21 NOTE — Discharge Instructions (Addendum)
 AFTER SURGERY INSTRUCTIONS   Return to work: 4-6 weeks if applicable  You can resume your aspirin  325 mg starting tomorrow, May 6.  Dr. Daisey Dryer would like for you to continue taking miralax  twice daily. When bowels are moving, you can decrease to once daily.   Activity: 1. Be up and out of the bed during the day.  Take a nap if needed.  You may walk up steps but be careful and use the hand rail.  Stair climbing will tire you more than you think, you may need to stop part way and rest.    2. No lifting or straining for 6 weeks over 10 pounds. No pushing, pulling, straining for 6 weeks.   3. No driving for 1-61 days when the following criteria have been met: Do not drive if you are taking narcotic pain medicine and make sure that your reaction time has returned.    4. You can shower as soon as the next day after surgery. Shower daily.  Use your regular soap and water (not directly on the incision) and pat your incision(s) dry afterwards; don't rub.  No tub baths or submerging your body in water until cleared by your surgeon. If you have the soap that was given to you by pre-surgical testing that was used before surgery, you do not need to use it afterwards because this can irritate your incisions.    5. No sexual activity and nothing in the vagina for 12 weeks.   6. You may experience a small amount of clear drainage from your incisions, which is normal.  If the drainage persists, increases, or changes color please call the office.   7. Do not use creams, lotions, or ointments such as neosporin on your incisions after surgery until advised by your surgeon because they can cause removal of the dermabond glue on your incisions.     8. You may experience vaginal spotting after surgery or when the stitches at the top of the vagina begin to dissolve.  The spotting is normal but if you experience heavy bleeding, call our office.   9. Take Tylenol  first for pain if you are able to take these  medication and only use narcotic pain medication for severe pain not relieved by the Tylenol .  Monitor your Tylenol  intake to a max of 4,000 mg in a 24 hour period.   Diet: 1. Low sodium Heart Healthy Diet is recommended but you are cleared to resume your normal (before surgery) diet after your procedure.   2. It is safe to use a laxative, such as Miralax  or Colace, if you have difficulty moving your bowels before surgery. You have been prescribed Sennakot-S to take at bedtime every evening after surgery to keep bowel movements regular and to prevent constipation.     Wound Care: 1. Keep clean and dry.  Shower daily.   Reasons to call the Doctor: Fever - Oral temperature greater than 100.4 degrees Fahrenheit Foul-smelling vaginal discharge Difficulty urinating Nausea and vomiting Increased pain at the site of the incision that is unrelieved with pain medicine. Difficulty breathing with or without chest pain New calf pain especially if only on one side Sudden, continuing increased vaginal bleeding with or without clots.   Contacts: For questions or concerns you should contact:   Dr. Derrel Flies at [phone]   Vira Grieves, NP at [phone]   After Hours: call [phone] and have the GYN Oncologist paged/contacted (after 5 pm or on the weekends). You will speak  with an after hours RN and let he or she know you have had surgery.   Messages sent via mychart are for non-urgent matters and are not responded to after hours so for urgent needs, please call the after hours number.

## 2023-08-21 NOTE — Op Note (Addendum)
 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  Postoperative Diagnosis: Same  Procedures: Robotic-assisted total laparoscopic hysterectomy, bilateral salpingo-oophorectomy, radical dissection for tumor debulking, omentectomy, small bowel resection and side-to-side functional end-to-end anastomosis  Surgeon: Derrel Flies, MD  Assistants: Abdul Hodgkin, MD and (an MD assistant was necessary for tissue manipulation, management of robotic instrumentation, retraction and positioning due to the complexity of the case and hospital policies)  Anesthesia: General  Estimated Blood Loss: 200 mL    Fluids: 1100 ml, crystalloid  Urine Output: 500 ml, clear yellow  Findings: Upper abdominal survey with omentum and liver edge adherent to the anterior abdominal wall initially occluding view of bilateral diaphragm.  Following dissection of adhesions, able to view the liver surface and bilateral diaphragm with evidence of true disease but no concerning areas for active disease.  Omentum with small nodularity and scarring consistent with treated disease and possibly some residual small volume disease.  Small bowel and mesentery with miliary areas of treated versus miliary ongoing disease.  Solid left adnexal mass densely adherent to the left pelvic sidewall, the rectosigmoid colon mesentery, and a loop of distal ileum.  Dense fibrosis also encountered in the left pelvic sidewall.  Overall normal-appearing right ovary adherent to the ovarian fossa.  Apparent treated peritoneal disease to the right of the rectosigmoid colon without clear evidence of ongoing disease, biopsied.  At conclusion of case, R0 versus R1 resection pending if miliary disease (biopsied) on bowel and mesentery reflects treated versus active disease.   Specimens:  ID Type Source Tests Collected by Time Destination  1 : UTERUS,CERVIX,BILATERAL TUBES  AND OVARIES,PORTION OF SMALL BOWEL Tissue PATH Gyn tumor resection SURGICAL PATHOLOGY Derrel Flies, MD 08/21/2023 1550   2 : LEFT PELVIC SIDEWALL Tissue PATH Gyn tumor resection SURGICAL PATHOLOGY Derrel Flies, MD 08/21/2023 1610   3 : LEFT DISTAL PELVIC PERITONEUM Tissue PATH Gyn tumor resection SURGICAL PATHOLOGY Derrel Flies, MD 08/21/2023 1612   4 : RIGHT PERIRECTAL PERITONEAL BIOPSY Tissue PATH Gyn tumor resection SURGICAL PATHOLOGY Derrel Flies, MD 08/21/2023 1615   5 : Additional Small Bowel Tissue PATH GI tumor resection SURGICAL PATHOLOGY Derrel Flies, MD 08/21/2023 1655   6 : Omentum Tissue PATH GI biopsy SURGICAL PATHOLOGY Derrel Flies, MD 08/21/2023 1712     Complications:  None  Indications for Procedure: MELEK POWNALL is a 78 y.o. woman with advanced stage carcinoma of likely Gyn origin who has undergone neoadjuvant chemotherapy and presenting for interval debulking surgery.  Prior to the procedure, all risks, benefits, and alternatives were discussed and informed surgical consent was signed.  Procedure: Patient was taken to the operating room where general anesthesia was achieved.  She was positioned in dorsal lithotomy and prepped and draped.  A foley catheter was inserted into the bladder. The cervix was dilated and an Advincula uterine manipulator with a colpotomy ring was inserted into the uterus.  A 5 mm incision was made in the left upper quadrant near Palmer's point.  The abdomen was entered with a 5 mm OptiView trocar under direct visualization.  The abdomen was insufflated, the patient placed in steep Trendelenburg, and additional trocars were placed as follows: an 8mm trocar superior to the umbilicus, two 8 mm robotic trocars in the right abdomen, and one 8 mm robotic trocar in the left abdomen.  The left upper quadrant trocar was removed and replaced with a 5 mm airseal trocar.  All trocars were placed under  direct visualization.    The patient was then  returned to level in order to first address adhesions in the upper abdomen.  Using blunt dissection and bipolar cautery with the LigaSure device, adhesions of the omentum and the liver edge to the anterior abdominal wall were serially lysed to be able to view the upper abdomen.  Following this, additional adhesions of the omentum to the left colic gutter was dissected with blunt and sharp dissection with the LigaSure device to mobilize the omentum off of the sidewall.  The patient was then put back in steep Trendelenburg and the bowels were moved into the upper abdomen.  The DaVinci robotic surgical system was brought to the patient's bedside and docked.  Attention was first turned to the area of small bowel adherent to the left adnexal mass.  Sharp dissection with cold scissors was performed to attempt to mobilize the bowel off of the adnexal mass.  However, given that it appeared that the mass was densely adherent to this portion of small bowel, decision was made to resect the bowel to be removed en bloc with the ovarian specimen.  A defect was made in the mesentery proximal to the affected portion.  The left lateral robotic instrument was then removed and the trocar upsized to a 12 robotic trocar.  The robotic stapler was then introduced and the bowel was stapled and divided at this area. A similar defect was made distal to the involvement with the adnexal mass and the bowel was again stapled and divided with the robotic stapler. The mesentery of this section of bowel was serially cauterized with bipolar cautery and transected with scissors, mobilizing the free portion of the small bowel off of the left adnexa.  Adhesions of the small bowel mesentery to the rectosigmoid colon mesentery were encountered and divided with a combination of sharp and blunt dissection.  The left round ligament was then transected and the retroperitoneum entered.  Dense fibrosis was encountered in the left pelvic sidewall.  Using  blunt dissection, the left ureter was ultimately identified.  The solid left adnexal mass was mobilized off of the rectosigmoid mesentery with blunt dissection.  A window was then made in the medial leaf of the broad, and the left infundibulopelvic ligament was isolated, cauterized, and transected. The posterior peritoneum was opened to the KOH ring.  The anterior peritoneum was opened and the bladder flap was initiated.  The left uterine artery was skeletonized, cauterized, and transected at the level of the KOH ring. Additional cautery was used in a C-shaped fashion to allow the remainder of the broad, cardinal, and uterosacral ligaments with the uterine vessels to be transected and fall away from the KOH ring. A similar procedure was performed on the right side.  A colpotomy was made circumferentially following the contours of the KOH ring.  The uterine specimen was removed through the vagina.  The vaginal cuff was closed with a running stitch of 0 Vicryl suture.  The pelvis was irrigated.  An area of the peritoneum to the right of the rectosigmoid colon was noted to have white scarring, likely consistent with treated disease.  This area was resected for biopsy using sharp dissection.  The pelvis was again irrigated.  Surgiflo was placed on the left pelvic sidewall and the left paracolic gutter where extensive dissection occurred and hemostasis was achieved.  All instruments were removed and the robot was taken from the patient's bedside.  The fascia at the 12 mm incision was closed with  0 Vicryl using a PMI device.  The supraumbilical robotic trocar was then removed.  This incision was extended with a scalpel for mini laparotomy.  The subcutaneous tissue and fascia were divided with electrocautery and the abdomen entered.  The abdomen was desufflated and all ports were removed.  An Alexis retractor was placed.  The small bowel was run until we reached the location of the small bowel resection.  Due to some  disruption in the mesentery underlying the staple line of the distal limb, an additional portion of the small bowel was resected.  A defect was made underlying the bowel and the small bowel was stapled and divided with a GIA stapler.  The underlying mesentery was cauterized and transected with the LigaSure device and this additional portion of small bowel was handed off the field. The proximal and distal ends of the ileum were aligned. A stabilizing suture was placed proximally. A defect was made in the ileal segments and the GIA stapler was used to create a side-to-side, functional end to end anastomosis. The defect left was grasped with allis clamps to elevate the cut edge. The GIA stapler was then used to close the defect. A a running stitch of 3-0 vicryl was used to close the mesenteric defect.  Attention was then turned to the omentum. An area superior to the transverse colon was incised and the lesser sac was entered.  Careful dissection was done to remove the omentum from the length of the transverse colon with electrocautery. Portions were adherent due to apparent treated disease. A combination of sharp and blunt dissection was used to carefully dissection the omentum off of the underlying mesentery. Pedicles were made in the omentum superior to the transverse colon but caudad to the short gastrics. These were cauterized and transected with Ligasure cautery. All nodules were removed. Hemostasis was noted at all pedicles.   The fascia at the midline mini laparotomy was closed with a running stitch of #1 PDS.  The subcutaneous tissue was irrigated and made hemostatic with electrocautery.  Exparel  was infiltrated in standard fashion.  The subcutaneous layer was reapproximated with 2-0 Vicryl in a running fashion.  The skin was then closed with 4-0 Vicryl in a deep dermal layer followed by 4-0 Monocryl in a subcuticular fashion and surgical glue.  The skin at all laparoscopic incisions was closed with 4-0  Vicryl to reapproximate the subcutaneous tissue and 4-0 monocryl in a subcuticular fashion followed by surgical glue.  On examination of the vagina at the conclusion of the procedure, small bleeding was noted from the introitus. One figure of eight stitch was placed with 3-0 vicryl, and hemostasis was achieved.  Patient tolerated the procedure well. Sponge, lap, and instrument counts were correct.  Patient received 2 gm of Ancef  and 500mg  of metronidazole  prior to skin incision for routine perioperative antibiotic prophylaxis.  She was extubated and taken to the PACU in stable condition.  Derrel Flies, MD Gynecologic Oncology

## 2023-08-21 NOTE — Anesthesia Procedure Notes (Signed)
 Procedure Name: Intubation Date/Time: 08/21/2023 1:07 PM  Performed by: Jinny Mounts, CRNAPre-anesthesia Checklist: Patient identified, Emergency Drugs available, Suction available and Patient being monitored Patient Re-evaluated:Patient Re-evaluated prior to induction Oxygen Delivery Method: Circle System Utilized Preoxygenation: Pre-oxygenation with 100% oxygen Induction Type: IV induction Ventilation: Mask ventilation without difficulty Laryngoscope Size: Mac and 3 Grade View: Grade I Tube type: Oral Tube size: 7.0 mm Number of attempts: 1 Airway Equipment and Method: Stylet Placement Confirmation: ETT inserted through vocal cords under direct vision, positive ETCO2 and breath sounds checked- equal and bilateral Secured at: 22 cm Tube secured with: Tape Dental Injury: Teeth and Oropharynx as per pre-operative assessment

## 2023-08-21 NOTE — Interval H&P Note (Signed)
 History and Physical Interval Note:  08/21/2023 12:47 PM  Catherine Lowery  has presented today for surgery, with the diagnosis of gyn malignancy.  The various methods of treatment have been discussed with the patient and family. After consideration of risks, benefits and other options for treatment, the patient has consented to  Procedure(s) with comments: HYSTERECTOMY, TOTAL, ROBOT-ASSISTED, LAPAROSCOPIC, WITH BILATERAL SALPINGO-OOPHORECTOMY (N/A) DEBULKING, ABDOMINAL (N/A) - via robot approach OMENTECTOMY (N/A) - via robot approach LAPAROTOMY (N/A) EXCISION, SMALL INTESTINE (N/A) as a surgical intervention.  The patient's history has been reviewed, patient examined, no change in status, stable for surgery.  I have reviewed the patient's chart and labs.  Questions were answered to the patient's satisfaction.     Neymar Dowe

## 2023-08-21 NOTE — Transfer of Care (Signed)
 Immediate Anesthesia Transfer of Care Note  Patient: Catherine Lowery  Procedure(s) Performed: ROBOTIC ASSISTED TOTAL LAPAROSCOPY HYSTERECTOMY WITH BILATERAL SALPINGO-OOPHORECTOMY, RADICAL DISSECTION FOR DEBULKING OMENTECTOMY SMALL BOWEL RESECTION WITH SIDE-TO-SIDE, FUNCTIONAL END-TO-END ANASTOMOSIS  Patient Location: PACU  Anesthesia Type:General  Level of Consciousness: awake, drowsy, and patient cooperative  Airway & Oxygen Therapy: Patient Spontanous Breathing and Patient connected to face mask oxygen  Post-op Assessment: Report given to RN and Post -op Vital signs reviewed and stable  Post vital signs: Reviewed and stable  Last Vitals:  Vitals Value Taken Time  BP 140/71 08/21/23 1830  Temp    Pulse 62 08/21/23 1830  Resp 12 08/21/23 1830  SpO2 100 % 08/21/23 1830  Vitals shown include unfiled device data.  Last Pain:  Vitals:   08/21/23 1824  TempSrc:   PainSc: Asleep      Patients Stated Pain Goal: 4 (08/21/23 1052)  Complications: No notable events documented.

## 2023-08-22 ENCOUNTER — Encounter (HOSPITAL_COMMUNITY): Payer: Self-pay | Admitting: Psychiatry

## 2023-08-22 LAB — CBC
HCT: 27 % — ABNORMAL LOW (ref 36.0–46.0)
Hemoglobin: 8.8 g/dL — ABNORMAL LOW (ref 12.0–15.0)
MCH: 25.4 pg — ABNORMAL LOW (ref 26.0–34.0)
MCHC: 32.6 g/dL (ref 30.0–36.0)
MCV: 78 fL — ABNORMAL LOW (ref 80.0–100.0)
Platelets: 81 10*3/uL — ABNORMAL LOW (ref 150–400)
RBC: 3.46 MIL/uL — ABNORMAL LOW (ref 3.87–5.11)
RDW: 18.4 % — ABNORMAL HIGH (ref 11.5–15.5)
WBC: 8 10*3/uL (ref 4.0–10.5)
nRBC: 0 % (ref 0.0–0.2)

## 2023-08-22 LAB — BASIC METABOLIC PANEL WITH GFR
Anion gap: 9 (ref 5–15)
BUN: 23 mg/dL (ref 8–23)
CO2: 23 mmol/L (ref 22–32)
Calcium: 8.9 mg/dL (ref 8.9–10.3)
Chloride: 104 mmol/L (ref 98–111)
Creatinine, Ser: 0.65 mg/dL (ref 0.44–1.00)
GFR, Estimated: >60 mL/min (ref >60)
Glucose, Bld: 153 mg/dL — ABNORMAL HIGH (ref 70–99)
Potassium: 4.2 mmol/L (ref 3.5–5.1)
Sodium: 136 mmol/L (ref 135–145)

## 2023-08-22 NOTE — Progress Notes (Signed)
 Mobility Specialist - Progress Note   08/22/23 0901  Mobility  Activity Ambulated with assistance in hallway;Ambulated with assistance to bathroom  Level of Assistance Standby assist, set-up cues, supervision of patient - no hands on  Assistive Device Front wheel walker  Distance Ambulated (ft) 80 ft  Activity Response Tolerated well  Mobility Referral Yes  Mobility visit 1 Mobility  Mobility Specialist Start Time (ACUTE ONLY) 0847  Mobility Specialist Stop Time (ACUTE ONLY) 0901  Mobility Specialist Time Calculation (min) (ACUTE ONLY) 14 min   Pt received in bed and agreeable to mobility. Prior to ambulating, pt requested assistance to the bathroom. Distance limited d/t pain. Pt to bed after session with all needs met.   [facility]

## 2023-08-22 NOTE — Plan of Care (Signed)
 ?  Problem: Clinical Measurements: ?Goal: Will remain free from infection ?Outcome: Progressing ?  ?

## 2023-08-22 NOTE — Plan of Care (Signed)
   Problem: Education: Goal: Knowledge of General Education information will improve Description: Including pain rating scale, medication(s)/side effects and non-pharmacologic comfort measures Outcome: Progressing   Problem: Clinical Measurements: Goal: Ability to maintain clinical measurements within normal limits will improve Outcome: Progressing

## 2023-08-22 NOTE — Anesthesia Postprocedure Evaluation (Signed)
 Anesthesia Post Note  Patient: Catherine Lowery  Procedure(s) Performed: ROBOTIC ASSISTED TOTAL LAPAROSCOPY HYSTERECTOMY WITH BILATERAL SALPINGO-OOPHORECTOMY, RADICAL DISSECTION FOR DEBULKING OMENTECTOMY SMALL BOWEL RESECTION WITH SIDE-TO-SIDE, FUNCTIONAL END-TO-END ANASTOMOSIS     Patient location during evaluation: PACU Anesthesia Type: General Level of consciousness: awake and alert Pain management: pain level controlled Vital Signs Assessment: post-procedure vital signs reviewed and stable Respiratory status: spontaneous breathing, nonlabored ventilation, respiratory function stable and patient connected to nasal cannula oxygen Cardiovascular status: blood pressure returned to baseline and stable Postop Assessment: no apparent nausea or vomiting Anesthetic complications: no   No notable events documented.  Last Vitals:    Last Pain:                 Erin Havers

## 2023-08-22 NOTE — Progress Notes (Signed)
 1 Day Post-Op Procedure(s) (LRB): ROBOTIC ASSISTED TOTAL LAPAROSCOPY HYSTERECTOMY WITH BILATERAL SALPINGO-OOPHORECTOMY, RADICAL DISSECTION FOR DEBULKING (N/A) OMENTECTOMY (N/A) SMALL BOWEL RESECTION WITH SIDE-TO-SIDE, FUNCTIONAL END-TO-END ANASTOMOSIS (N/A)  Subjective: Patient reports mild abd pain.  No N/V/flatus/BM.  Objective: Vital signs in last 24 hours: Temp:  [97.5 F (36.4 C)-98.4 F (36.9 C)] 98.1 F (36.7 C) (04/23 0925) Pulse Rate:  [58-73] 58 (04/23 0925) Resp:  [10-18] 18 (04/23 0925) BP: (120-156)/(63-80) 120/63 (04/23 0925) SpO2:  [91 %-100 %] 100 % (04/23 0925)    Intake/Output from previous day: 04/22 0701 - 04/23 0700 In: 1820 [P.O.:120; I.V.:1250; IV Piggyback:450] Out: 1700 [Urine:1500; Blood:200]  Physical Examination: General: alert GI: soft, non-tender; bowel sounds normal; no masses,  no organomegaly and incision: clean, dry, and intact Extremities: extremities normal, atraumatic, no cyanosis or edema and Homans sign is negative, no sign of DVT  Labs: WBC/Hgb/Hct/Plts:  8.0/8.8/27.0/81 (04/23 0257) BUN/Cr/glu/ALT/AST/amyl/lip:  23/0.65/--/--/--/--/-- (04/23 0257)   Assessment:  78 y.o. s/p Procedure(s): ROBOTIC ASSISTED TOTAL LAPAROSCOPY HYSTERECTOMY WITH BILATERAL SALPINGO-OOPHORECTOMY, RADICAL DISSECTION FOR DEBULKING OMENTECTOMY SMALL BOWEL RESECTION WITH SIDE-TO-SIDE, FUNCTIONAL END-TO-END ANASTOMOSIS: stable Pain:  Pain is well-controlled on  oral medications.  Heme: "Pan"cytopenia--stable  CV: Hypertension:  controlled. Current treatment:  amlodipine  (Norvase) and propranolol .  GI:  Tolerating po: Yes    . FEN: No metabolic derangements   Prophylaxis: pharmacologic prophylaxis (with any of the following: enoxaparin  (Lovenox ) 40mg  SQ 2 hours prior to surgery then every day) and intermittent pneumatic compression boots.  Plan: Advance diet Encourage ambulation Discontinue IV fluids Serial labs    LOS: 1 day    Abdul Hodgkin, MD 08/22/2023, 10:55 AM

## 2023-08-23 LAB — BASIC METABOLIC PANEL WITH GFR
Anion gap: 7 (ref 5–15)
BUN: 35 mg/dL — ABNORMAL HIGH (ref 8–23)
CO2: 24 mmol/L (ref 22–32)
Calcium: 8.7 mg/dL — ABNORMAL LOW (ref 8.9–10.3)
Chloride: 103 mmol/L (ref 98–111)
Creatinine, Ser: 0.95 mg/dL (ref 0.44–1.00)
GFR, Estimated: >60 mL/min (ref >60)
Glucose, Bld: 115 mg/dL — ABNORMAL HIGH (ref 70–99)
Potassium: 3.8 mmol/L (ref 3.5–5.1)
Sodium: 134 mmol/L — ABNORMAL LOW (ref 135–145)

## 2023-08-23 LAB — CBC
HCT: 23.9 % — ABNORMAL LOW (ref 36.0–46.0)
HCT: 22.4 % — ABNORMAL LOW (ref 36.0–46.0)
Hemoglobin: 7.3 g/dL — ABNORMAL LOW (ref 12.0–15.0)
Hemoglobin: 7.4 g/dL — ABNORMAL LOW (ref 12.0–15.0)
MCH: 24.3 pg — ABNORMAL LOW (ref 26.0–34.0)
MCH: 25.4 pg — ABNORMAL LOW (ref 26.0–34.0)
MCHC: 30.5 g/dL (ref 30.0–36.0)
MCHC: 33 g/dL (ref 30.0–36.0)
MCV: 79.7 fL — ABNORMAL LOW (ref 80.0–100.0)
MCV: 77 fL — ABNORMAL LOW (ref 80.0–100.0)
Platelets: 70 10*3/uL — ABNORMAL LOW (ref 150–400)
Platelets: 72 10*3/uL — ABNORMAL LOW (ref 150–400)
RBC: 3 MIL/uL — ABNORMAL LOW (ref 3.87–5.11)
RBC: 2.91 MIL/uL — ABNORMAL LOW (ref 3.87–5.11)
RDW: 18.1 % — ABNORMAL HIGH (ref 11.5–15.5)
RDW: 17.9 % — ABNORMAL HIGH (ref 11.5–15.5)
WBC: 8.4 10*3/uL (ref 4.0–10.5)
WBC: 7.9 10*3/uL (ref 4.0–10.5)
nRBC: 0 % (ref 0.0–0.2)
nRBC: 0 % (ref 0.0–0.2)

## 2023-08-23 NOTE — Progress Notes (Signed)
 GYN Oncology Progress Note  2 Days Post-Op Procedure(s) (LRB): ROBOTIC ASSISTED TOTAL LAPAROSCOPY HYSTERECTOMY WITH BILATERAL SALPINGO-OOPHORECTOMY, RADICAL DISSECTION FOR DEBULKING (N/A) OMENTECTOMY (N/A) SMALL BOWEL RESECTION WITH SIDE-TO-SIDE, FUNCTIONAL END-TO-END ANASTOMOSIS (N/A)  Subjective: Patient reports sleeping on and off throughout the night. Abdomen feels "fine" this am and is managed with oral pain pills for around 5 hrs of relief at a time. Walked in halls yesterday and sat in chair. No flatus. Voiding without difficulty. No nausea. Appetite decreased due to being on liquids. No needs voiced at this time.   Objective: Vital signs in last 24 hours: Temp:  [97.7 F (36.5 C)-98.6 F (37 C)] 98 F (36.7 C) (04/24 7253) Pulse Rate:  [55-67] 57 (04/24 0632) Resp:  [14-20] 19 (04/24 6644) BP: (108-129)/(59-68) 129/68 (04/24 0347) SpO2:  [97 %-100 %] 97 % (04/24 0632) Last BM Date : 08/20/23  Intake/Output from previous day: 04/23 0701 - 04/24 0700 In: 1503 [P.O.:1500; I.V.:3] Out: 1250 [Urine:1250]  Physical Examination: General: alert, cooperative, and no distress Resp: clear to auscultation bilaterally Cardio: regular rate and rhythm, S1, S2 normal, no murmur, click, rub or gallop GI: incision: clean and incisions to the abdomen with dermabond intact and abdomen is soft, mildly distended, hypoactive bowel sounds Extremities: extremities normal, atraumatic, no cyanosis or edema  Labs: WBC/Hgb/Hct/Plts:  8.4/7.3/23.9/70 (04/24 0223) BUN/Cr/glu/ALT/AST/amyl/lip:  35/0.95/--/--/--/--/-- (04/24 0223)  Assessment: 78 y.o. s/p Procedure(s): ROBOTIC ASSISTED TOTAL LAPAROSCOPY HYSTERECTOMY WITH BILATERAL SALPINGO-OOPHORECTOMY, RADICAL DISSECTION FOR DEBULKING OMENTECTOMY SMALL BOWEL RESECTION WITH SIDE-TO-SIDE, FUNCTIONAL END-TO-END ANASTOMOSIS: stable Pain:  Pain is well-controlled on  oral medications.  Heme: Hgb down to 7.3 from 8.8 and PLT count at 70 from 81.  Lovenox  discontinued yesterday due to thrombocytopenia. Plan for repeat CBC later this am to re-evaluate levels.  CV: Hypertension: controlled. Current treatment:  amlodipine  (Norvasc ) and propranolol . Continue to monitor with routine vital signs.  GI:  Tolerating po: Yes. Awaiting return of bowel function. Currently on full liquids    . FEN: No critical values on am labs  Prophylaxis: SCDs. Lovenox  on hold due to PLT count, drop in Hgb  Plan: CBC later this am Awaiting return of bowel function Encourage ambulation   LOS: 2 days    Catherine Emory, MD 08/23/2023, 7:51 AM

## 2023-08-23 NOTE — Plan of Care (Signed)

## 2023-08-23 NOTE — Progress Notes (Signed)
   08/23/23 1051  TOC Brief Assessment  Insurance and Status Reviewed  Patient has primary care physician Yes  Home environment has been reviewed home alone  Prior level of function: mod independent  Prior/Current Home Services No current home services  Social Drivers of Health Review SDOH reviewed no interventions necessary  Readmission risk has been reviewed Yes  Transition of care needs transition of care needs identified, TOC will continue to follow

## 2023-08-23 NOTE — Progress Notes (Addendum)
 GYN Oncology Progress Note  Patient reports doing well. Has ambulated in the halls without difficulty. Tolerating full liquid diet with no nausea or emesis. No flatus or BM reported. Voiding without difficulty. No dizziness, chest pain, dyspnea.  A&O x 3 resting in bed, in no acute distress. Lungs clear. Heart regular in rate and rhythm. Abdomen is soft. Active bowel sounds, non-distended. Incisions are intact with no erythema or drainage. No lower extrem edema.  Continue with current plan of care. Repeat CBC reviewed with patient. Awaiting return of bowel function. No needs voiced per pt at this time.

## 2023-08-23 NOTE — Progress Notes (Signed)
 Mobility Specialist - Progress Note   08/23/23 0828  Mobility  Activity Ambulated with assistance in hallway;Ambulated with assistance to bathroom  Level of Assistance Modified independent, requires aide device or extra time  Assistive Device Front wheel walker  Distance Ambulated (ft) 250 ft  Activity Response Tolerated well  Mobility Referral Yes  Mobility visit 1 Mobility  Mobility Specialist Start Time (ACUTE ONLY) [id number]  Mobility Specialist Stop Time (ACUTE ONLY) 0826  Mobility Specialist Time Calculation (min) (ACUTE ONLY) 12 min   Pt received in bed and agreeable to mobility. Prior to ambulating, pt requested assistance to the bathroom. Distance limited d/t pain. Pt to bed after session with all needs met. Bed alarm on.   [facility]

## 2023-08-24 LAB — CBC
HCT: 24.6 % — ABNORMAL LOW (ref 36.0–46.0)
Hemoglobin: 7.8 g/dL — ABNORMAL LOW (ref 12.0–15.0)
MCH: 24.7 pg — ABNORMAL LOW (ref 26.0–34.0)
MCHC: 31.7 g/dL (ref 30.0–36.0)
MCV: 77.8 fL — ABNORMAL LOW (ref 80.0–100.0)
Platelets: 79 10*3/uL — ABNORMAL LOW (ref 150–400)
RBC: 3.16 MIL/uL — ABNORMAL LOW (ref 3.87–5.11)
RDW: 17.7 % — ABNORMAL HIGH (ref 11.5–15.5)
WBC: 8.6 10*3/uL (ref 4.0–10.5)
nRBC: 0 % (ref 0.0–0.2)

## 2023-08-24 LAB — BASIC METABOLIC PANEL WITH GFR
Anion gap: 9 (ref 5–15)
BUN: 24 mg/dL — ABNORMAL HIGH (ref 8–23)
CO2: 25 mmol/L (ref 22–32)
Calcium: 8.9 mg/dL (ref 8.9–10.3)
Chloride: 101 mmol/L (ref 98–111)
Creatinine, Ser: 0.8 mg/dL (ref 0.44–1.00)
GFR, Estimated: >60 mL/min (ref >60)
Glucose, Bld: 114 mg/dL — ABNORMAL HIGH (ref 70–99)
Potassium: 3.6 mmol/L (ref 3.5–5.1)
Sodium: 135 mmol/L (ref 135–145)

## 2023-08-24 NOTE — Progress Notes (Signed)
 GYN Oncology Progress Note  3 Days Post-Op Procedure(s) (LRB): ROBOTIC ASSISTED TOTAL LAPAROSCOPY HYSTERECTOMY WITH BILATERAL SALPINGO-OOPHORECTOMY, RADICAL DISSECTION FOR DEBULKING (N/A) OMENTECTOMY (N/A) SMALL BOWEL RESECTION WITH SIDE-TO-SIDE, FUNCTIONAL END-TO-END ANASTOMOSIS (N/A)  Subjective: Patient reports eating meatloaf last pm and having moderate abdominal pain and cramping after this throughout the night. No nausea or emesis reported. Continues to walk in halls and sit in chair. No flatus or BM. Voiding without difficulty.   Objective: Vital signs in last 24 hours: Temp:  [97.6 F (36.4 C)-98.1 F (36.7 C)] 97.6 F (36.4 C) (04/25 0455) Pulse Rate:  [60-63] 63 (04/25 0455) Resp:  [16-18] 18 (04/25 0455) BP: (130-143)/(62-75) 143/75 (04/25 0455) SpO2:  [96 %-98 %] 97 % (04/25 0455) Last BM Date : 08/20/23  Intake/Output from previous day: 04/24 0701 - 04/25 0700 In: 1260 [P.O.:1260] Out: 1450 [Urine:1450]  Physical Examination: General: alert, cooperative, and no distress Resp: clear to auscultation bilaterally Cardio: regular rate and rhythm, S1, S2 normal, no murmur, click, rub or gallop GI: incision: clean and incisions to the abdomen with dermabond intact and abdomen is soft, mildly distended, hypoactive bowel sounds Extremities: extremities normal, atraumatic, no cyanosis or edema  Labs: WBC/Hgb/Hct/Plts:  8.6/7.8/24.6/79 (04/25 0251) BUN/Cr/glu/ALT/AST/amyl/lip:  24/0.80/--/--/--/--/-- (04/25 0251)  Assessment: 78 y.o. s/p Procedure(s): ROBOTIC ASSISTED TOTAL LAPAROSCOPY HYSTERECTOMY WITH BILATERAL SALPINGO-OOPHORECTOMY, RADICAL DISSECTION FOR DEBULKING OMENTECTOMY SMALL BOWEL RESECTION WITH SIDE-TO-SIDE, FUNCTIONAL END-TO-END ANASTOMOSIS: stable Pain:  Pain is well-controlled on  oral medications.  Heme: CBC overall stable: Hgb at 7.8, PLT count at 79. Asymptomatic acute on chronic anemia.   CV: Hypertension: controlled. Current treatment:  amlodipine   (Norvasc ) and propranolol . Continue to monitor with routine vital signs.  GI:  Tolerating po: Yes. Awaiting return of bowel function. Currently on heart healthy.    . FEN: No critical values on am labs  Prophylaxis: SCDs. Lovenox  on hold due to PLT count, drop  in Hgb  Plan: Continue with current plan of care Awaiting return of bowel function Discussed dietary choices with patient   LOS: 3 days    Suellyn Emory, MD 08/24/2023, 8:43 AM

## 2023-08-24 NOTE — Progress Notes (Signed)
 Mobility Specialist - Progress Note   08/24/23 0857  Mobility  Activity Ambulated with assistance in hallway  Level of Assistance Modified independent, requires aide device or extra time  Assistive Device Front wheel walker  Distance Ambulated (ft) 250 ft  Activity Response Tolerated well  Mobility Referral Yes  Mobility visit 1 Mobility  Mobility Specialist Start Time (ACUTE ONLY) 0840  Mobility Specialist Stop Time (ACUTE ONLY) 0856  Mobility Specialist Time Calculation (min) (ACUTE ONLY) 16 min   Pt received in bed and agreeable to mobility. No complaints during session. Pt to bed after session with all needs met.    [facility]

## 2023-08-24 NOTE — Plan of Care (Signed)
   Problem: Health Behavior/Discharge Planning: Goal: Ability to manage health-related needs will improve Outcome: Progressing

## 2023-08-25 ENCOUNTER — Inpatient Hospital Stay (HOSPITAL_COMMUNITY)

## 2023-08-25 LAB — CBC
HCT: 26 % — ABNORMAL LOW (ref 36.0–46.0)
Hemoglobin: 8.2 g/dL — ABNORMAL LOW (ref 12.0–15.0)
MCH: 24.9 pg — ABNORMAL LOW (ref 26.0–34.0)
MCHC: 31.5 g/dL (ref 30.0–36.0)
MCV: 79 fL — ABNORMAL LOW (ref 80.0–100.0)
Platelets: 93 10*3/uL — ABNORMAL LOW (ref 150–400)
RBC: 3.29 MIL/uL — ABNORMAL LOW (ref 3.87–5.11)
RDW: 17.3 % — ABNORMAL HIGH (ref 11.5–15.5)
WBC: 5.5 10*3/uL (ref 4.0–10.5)
nRBC: 0 % (ref 0.0–0.2)

## 2023-08-25 LAB — BASIC METABOLIC PANEL WITH GFR
Anion gap: 10 (ref 5–15)
BUN: 20 mg/dL (ref 8–23)
CO2: 25 mmol/L (ref 22–32)
Calcium: 8.8 mg/dL — ABNORMAL LOW (ref 8.9–10.3)
Chloride: 102 mmol/L (ref 98–111)
Creatinine, Ser: 0.79 mg/dL (ref 0.44–1.00)
GFR, Estimated: >60 mL/min (ref >60)
Glucose, Bld: 117 mg/dL — ABNORMAL HIGH (ref 70–99)
Potassium: 3.6 mmol/L (ref 3.5–5.1)
Sodium: 137 mmol/L (ref 135–145)

## 2023-08-25 MED ORDER — KCL IN DEXTROSE-NACL 20-5-0.45 MEQ/L-%-% IV SOLN
INTRAVENOUS | Status: DC
  Filled 2023-08-25 (×3): qty 1000

## 2023-08-25 MED ORDER — PANTOPRAZOLE SODIUM 40 MG IV SOLR
40.0000 mg | INTRAVENOUS | Status: DC
  Administered 2023-08-25 – 2023-08-28 (×4): 40 mg via INTRAVENOUS
  Filled 2023-08-25 (×5): qty 10

## 2023-08-25 MED ORDER — DIAZEPAM 5 MG/ML IJ SOLN
2.5000 mg | Freq: Once | INTRAMUSCULAR | Status: AC
Start: 2023-08-25 — End: 2023-08-25
  Administered 2023-08-25: 2.5 mg via INTRAVENOUS
  Filled 2023-08-25: qty 2

## 2023-08-25 MED ORDER — CARMEX CLASSIC LIP BALM EX OINT
TOPICAL_OINTMENT | CUTANEOUS | Status: DC | PRN
  Administered 2023-08-25: 1 via TOPICAL
  Filled 2023-08-25: qty 10

## 2023-08-25 MED ORDER — PHENOL 1.4 % MT LIQD
1.0000 | OROMUCOSAL | Status: DC | PRN
Start: 2023-08-25 — End: 2023-09-03
  Filled 2023-08-25: qty 177

## 2023-08-25 NOTE — Progress Notes (Signed)
 Mobility Specialist - Progress Note   08/25/23 0903  Mobility  Activity Ambulated with assistance to bathroom  Level of Assistance Standby assist, set-up cues, supervision of patient - no hands on  Assistive Device Front wheel walker  Distance Ambulated (ft) 20 ft  Activity Response Tolerated well  Mobility Referral Yes  Mobility visit 1 Mobility  Mobility Specialist Start Time (ACUTE ONLY) [id number]  Mobility Specialist Stop Time (ACUTE ONLY) [id number]  Mobility Specialist Time Calculation (min) (ACUTE ONLY) 12 min   Pt received in bed declining mobility but requesting assistance to the bathroom. C/o nausea during session. RN made aware. No other complaints during session. Pt to bed with all needs met.   [facility]

## 2023-08-25 NOTE — Progress Notes (Signed)
 GYN Oncology Progress Note  4 Days Post-Op Procedure(s) (LRB): ROBOTIC ASSISTED TOTAL LAPAROSCOPY HYSTERECTOMY WITH BILATERAL SALPINGO-OOPHORECTOMY, RADICAL DISSECTION FOR DEBULKING (N/A) OMENTECTOMY (N/A) SMALL BOWEL RESECTION WITH SIDE-TO-SIDE, FUNCTIONAL END-TO-END ANASTOMOSIS (N/A)  Subjective: I witnessed the pt vomiting.  Small volume emesis.  She has colic-like abdominal pain.  Some flatus, small BM.  The RN reports the pt mostly lies in bed.   Objective: Vital signs in last 24 hours: Temp:  [97.7 F (36.5 C)] 97.7 F (36.5 C) (04/26 0610) Pulse Rate:  [53-63] 61 (04/26 0610) Resp:  [15-17] 16 (04/26 0610) BP: (122-148)/(64-72) 148/72 (04/26 0610) SpO2:  [98 %-100 %] 100 % (04/26 0610) Last BM Date : 08/25/23  Intake/Output from previous day: 04/25 0701 - 04/26 0700 In: 1200 [P.O.:1200] Out: 750 [Urine:750]  Physical Examination: General: alert, cooperative, and no distress Resp: clear to auscultation bilaterally Cardio: regular rate and rhythm, S1, S2 normal, no murmur, click, rub or gallop GI: incision: clean and incisions to the abdomen with dermabond intact and abdomen is tympanic, NT, hypoactive bowel sounds Extremities: extremities normal, atraumatic, no cyanosis or edema  Labs: WBC/Hgb/Hct/Plts:  5.5/8.2/26.0/93 (04/26 1914) BUN/Cr/glu/ALT/AST/amyl/lip:  20/0.79/--/--/--/--/-- (04/26 7829)  Assessment: 78 y.o. s/p Procedure(s): ROBOTIC ASSISTED TOTAL LAPAROSCOPY HYSTERECTOMY WITH BILATERAL SALPINGO-OOPHORECTOMY, RADICAL DISSECTION FOR DEBULKING OMENTECTOMY SMALL BOWEL RESECTION WITH SIDE-TO-SIDE, FUNCTIONAL END-TO-END ANASTOMOSIS: stable Pain:  Pain is controlled on  oral medications.  Heme: CBC overall stable: Hgb at 7.8, PLT count at 79. Asymptomatic acute on chronic anemia.   CV: Hypertension: controlled. Current treatment:  amlodipine  (Norvasc ) and propranolol . Continue to monitor with routine vital signs.  GI: Postop ileus. R/O anastomotic leak--low  suspicion.  WBC/VS normal.    . FEN: No critical values on am labs  Prophylaxis: SCDs. Lovenox  on hold due to PLT count, drop  in Hgb  Plan: NGT CTAP w/contrast Encouraged ambulation  LOS: 4 days    Abdul Hodgkin, MD 08/25/2023, 9:33 AM

## 2023-08-25 NOTE — Plan of Care (Signed)
 ?  Problem: Clinical Measurements: ?Goal: Will remain free from infection ?Outcome: Progressing ?  ?

## 2023-08-25 NOTE — Progress Notes (Signed)
 After giving prn med, inserted # 16 Fr salem sump into pt's R nare, pt tol fairly well w sm nosebleed. Awaiting xray placement verification.

## 2023-08-25 NOTE — Plan of Care (Signed)
  Problem: Education: Goal: Knowledge of General Education information will improve Description: Including pain rating scale, medication(s)/side effects and non-pharmacologic comfort measures Outcome: Progressing   Problem: Health Behavior/Discharge Planning: Goal: Ability to manage health-related needs will improve Outcome: Progressing   Problem: Clinical Measurements: Goal: Ability to maintain clinical measurements within normal limits will improve Outcome: Progressing Goal: Will remain free from infection Outcome: Progressing Goal: Diagnostic test results will improve Outcome: Progressing Goal: Respiratory complications will improve Outcome: Progressing Goal: Cardiovascular complication will be avoided Outcome: Progressing   Problem: Activity: Goal: Risk for activity intolerance will decrease Outcome: Progressing   Problem: Nutrition: Goal: Adequate nutrition will be maintained Outcome: Progressing   Problem: Coping: Goal: Level of anxiety will decrease Outcome: Progressing   Problem: Elimination: Goal: Will not experience complications related to bowel motility Outcome: Progressing Goal: Will not experience complications related to urinary retention Outcome: Progressing   Problem: Pain Managment: Goal: General experience of comfort will improve and/or be controlled Outcome: Progressing   Problem: Safety: Goal: Ability to remain free from injury will improve Outcome: Progressing   Problem: Skin Integrity: Goal: Risk for impaired skin integrity will decrease Outcome: Progressing   Problem: Education: Goal: Knowledge of the prescribed therapeutic regimen will improve Outcome: Progressing Goal: Understanding of sexual limitations or changes related to disease process or condition will improve Outcome: Progressing Goal: Individualized Educational Video(s) Outcome: Progressing   Problem: Self-Concept: Goal: Communication of feelings regarding changes in body  function or appearance will improve Outcome: Progressing   Problem: Skin Integrity: Goal: Demonstration of wound healing without infection will improve Outcome: Progressing

## 2023-08-26 ENCOUNTER — Inpatient Hospital Stay (HOSPITAL_COMMUNITY)

## 2023-08-26 LAB — COMPREHENSIVE METABOLIC PANEL WITH GFR
ALT: 9 U/L (ref 0–44)
AST: 15 U/L (ref 15–41)
Albumin: 3.1 g/dL — ABNORMAL LOW (ref 3.5–5.0)
Alkaline Phosphatase: 46 U/L (ref 38–126)
Anion gap: 6 (ref 5–15)
BUN: 10 mg/dL (ref 8–23)
CO2: 27 mmol/L (ref 22–32)
Calcium: 8.5 mg/dL — ABNORMAL LOW (ref 8.9–10.3)
Chloride: 99 mmol/L (ref 98–111)
Creatinine, Ser: 0.66 mg/dL (ref 0.44–1.00)
GFR, Estimated: >60 mL/min (ref >60)
Glucose, Bld: 129 mg/dL — ABNORMAL HIGH (ref 70–99)
Potassium: 3.7 mmol/L (ref 3.5–5.1)
Sodium: 132 mmol/L — ABNORMAL LOW (ref 135–145)
Total Bilirubin: 0.9 mg/dL (ref 0.0–1.2)
Total Protein: 6.7 g/dL (ref 6.5–8.1)

## 2023-08-26 LAB — CBC WITH DIFFERENTIAL/PLATELET
Abs Immature Granulocytes: 0.01 10*3/uL (ref 0.00–0.07)
Basophils Absolute: 0 10*3/uL (ref 0.0–0.1)
Basophils Relative: 0 %
Eosinophils Absolute: 0.1 10*3/uL (ref 0.0–0.5)
Eosinophils Relative: 3 %
HCT: 25.1 % — ABNORMAL LOW (ref 36.0–46.0)
Hemoglobin: 8 g/dL — ABNORMAL LOW (ref 12.0–15.0)
Immature Granulocytes: 0 %
Lymphocytes Relative: 16 %
Lymphs Abs: 0.8 10*3/uL (ref 0.7–4.0)
MCH: 25.2 pg — ABNORMAL LOW (ref 26.0–34.0)
MCHC: 31.9 g/dL (ref 30.0–36.0)
MCV: 78.9 fL — ABNORMAL LOW (ref 80.0–100.0)
Monocytes Absolute: 0.5 10*3/uL (ref 0.1–1.0)
Monocytes Relative: 10 %
Neutro Abs: 3.6 10*3/uL (ref 1.7–7.7)
Neutrophils Relative %: 71 %
Platelets: 98 10*3/uL — ABNORMAL LOW (ref 150–400)
RBC: 3.18 MIL/uL — ABNORMAL LOW (ref 3.87–5.11)
RDW: 16.9 % — ABNORMAL HIGH (ref 11.5–15.5)
WBC: 5 10*3/uL (ref 4.0–10.5)
nRBC: 0 % (ref 0.0–0.2)

## 2023-08-26 LAB — MAGNESIUM: Magnesium: 1.6 mg/dL — ABNORMAL LOW (ref 1.7–2.4)

## 2023-08-26 MED ORDER — LEVOTHYROXINE SODIUM 50 MCG PO TABS
50.0000 ug | ORAL_TABLET | Freq: Every day | ORAL | Status: DC
  Administered 2023-08-27 – 2023-08-28 (×2): 50 ug via NASOGASTRIC
  Filled 2023-08-26 (×2): qty 1

## 2023-08-26 MED ORDER — MAGNESIUM SULFATE 50 % IJ SOLN: 1.0000 g | Freq: Once | INTRAVENOUS | Status: DC

## 2023-08-26 MED ORDER — MAGNESIUM SULFATE IN D5W 1-5 GM/100ML-% IV SOLN
1.0000 g | Freq: Once | INTRAVENOUS | Status: AC
  Administered 2023-08-26: 1 g via INTRAVENOUS
  Filled 2023-08-26: qty 100

## 2023-08-26 MED ORDER — ACETAMINOPHEN 10 MG/ML IV SOLN
1000.0000 mg | Freq: Two times a day (BID) | INTRAVENOUS | Status: AC
  Administered 2023-08-26 (×2): 1000 mg via INTRAVENOUS
  Filled 2023-08-26 (×2): qty 100

## 2023-08-26 MED ORDER — IOHEXOL 300 MG/ML  SOLN
100.0000 mL | Freq: Once | INTRAMUSCULAR | Status: AC | PRN
  Administered 2023-08-26: 100 mL via INTRAVENOUS

## 2023-08-26 MED ORDER — IOHEXOL 9 MG/ML PO SOLN
500.0000 mL | ORAL | Status: AC
  Administered 2023-08-26: 500 mL via ORAL

## 2023-08-26 MED ORDER — AMLODIPINE BESYLATE 10 MG PO TABS
10.0000 mg | ORAL_TABLET | Freq: Every day | ORAL | Status: DC
  Administered 2023-08-27 – 2023-08-28 (×2): 10 mg via NASOGASTRIC
  Filled 2023-08-26 (×2): qty 1

## 2023-08-26 MED ORDER — KCL IN DEXTROSE-NACL 20-5-0.9 MEQ/L-%-% IV SOLN
INTRAVENOUS | Status: AC
  Filled 2023-08-26 (×4): qty 1000

## 2023-08-26 MED ORDER — PROPRANOLOL HCL 10 MG PO TABS
20.0000 mg | ORAL_TABLET | Freq: Two times a day (BID) | ORAL | Status: DC
  Administered 2023-08-26 – 2023-08-28 (×5): 20 mg via NASOGASTRIC
  Filled 2023-08-26 (×5): qty 2

## 2023-08-26 NOTE — Progress Notes (Signed)
 GYN Oncology Progress Note  5 Days Post-Op Procedure(s) (LRB): ROBOTIC ASSISTED TOTAL LAPAROSCOPY HYSTERECTOMY WITH BILATERAL SALPINGO-OOPHORECTOMY, RADICAL DISSECTION FOR DEBULKING (N/A) OMENTECTOMY (N/A) SMALL BOWEL RESECTION WITH SIDE-TO-SIDE, FUNCTIONAL END-TO-END ANASTOMOSIS (N/A)  Subjective:   She continues to have colic-like abdominal pain. No nausea. No flatus/BM since Thursday.  Last po intake last Monday.    Objective: Vital signs in last 24 hours: Temp:  [97.6 F (36.4 C)-98.1 F (36.7 C)] 98.1 F (36.7 C) (04/27 0530) Pulse Rate:  [59-72] 72 (04/27 0530) Resp:  [16-18] 18 (04/27 0530) BP: (138-158)/(65-80) 144/80 (04/27 0530) SpO2:  [99 %-100 %] 99 % (04/27 0530) Last BM Date : 08/23/23  Intake/Output from previous day: 04/26 0701 - 04/27 0700 In: 2172.3 [P.O.:270; I.V.:1782.3; NG/GT:120] Out: 900 [Urine:200; Emesis/NG output:700]  Physical Examination: General: alert, cooperative, and no distress Resp: clear to auscultation bilaterally Cardio: regular rate and rhythm, S1, S2 normal, no murmur, click, rub or gallop GI: incision: clean and incisions to the abdomen with dermabond intact and abdomen is tympanic, NT, hypoactive bowel sounds Extremities: extremities normal, atraumatic, no cyanosis or edema  Labs:      Assessment: 78 y.o. s/p Procedure(s): ROBOTIC ASSISTED TOTAL LAPAROSCOPY HYSTERECTOMY WITH BILATERAL SALPINGO-OOPHORECTOMY, RADICAL DISSECTION FOR DEBULKING OMENTECTOMY SMALL BOWEL RESECTION WITH SIDE-TO-SIDE, FUNCTIONAL END-TO-END ANASTOMOSIS: stable Pain:  Pain is controlled on  oral medications.  Heme:  Asymptomatic acute on chronic anemia.   CV: Hypertension: controlled. Current treatment:  amlodipine  (Norvasc ) and propranolol . Continue to monitor with routine vital signs.  GI: Postop ileus. R/O anastomotic leak--low suspicion.  Minimal NGT drainage.  WBC/VS normal.     FEN: Labs for today are pending  Prophylaxis: SCDs. Lovenox  on  hold  Plan: Continue NGT CTAP w/contrast today Serial labs Encouraged ambulation  LOS: 5 days    Abdul Hodgkin, MD 08/26/2023, 10:12 AM

## 2023-08-27 LAB — BASIC METABOLIC PANEL WITH GFR
Anion gap: 5 (ref 5–15)
Anion gap: 9 (ref 5–15)
BUN: 6 mg/dL — ABNORMAL LOW (ref 8–23)
BUN: 9 mg/dL (ref 8–23)
CO2: 16 mmol/L — ABNORMAL LOW (ref 22–32)
CO2: 25 mmol/L (ref 22–32)
Calcium: 5 mg/dL — CL (ref 8.9–10.3)
Calcium: 8.6 mg/dL — ABNORMAL LOW (ref 8.9–10.3)
Chloride: 120 mmol/L — ABNORMAL HIGH (ref 98–111)
Chloride: 102 mmol/L (ref 98–111)
Creatinine, Ser: 0.38 mg/dL — ABNORMAL LOW (ref 0.44–1.00)
Creatinine, Ser: 0.76 mg/dL (ref 0.44–1.00)
GFR, Estimated: >60 mL/min (ref >60)
GFR, Estimated: >60 mL/min (ref >60)
Glucose, Bld: 75 mg/dL (ref 70–99)
Glucose, Bld: 122 mg/dL — ABNORMAL HIGH (ref 70–99)
Potassium: 2.2 mmol/L — CL (ref 3.5–5.1)
Potassium: 3.8 mmol/L (ref 3.5–5.1)
Sodium: 141 mmol/L (ref 135–145)
Sodium: 136 mmol/L (ref 135–145)

## 2023-08-27 LAB — CBC
HCT: 25 % — ABNORMAL LOW (ref 36.0–46.0)
Hemoglobin: 7.8 g/dL — ABNORMAL LOW (ref 12.0–15.0)
MCH: 24.8 pg — ABNORMAL LOW (ref 26.0–34.0)
MCHC: 31.2 g/dL (ref 30.0–36.0)
MCV: 79.4 fL — ABNORMAL LOW (ref 80.0–100.0)
Platelets: 95 10*3/uL — ABNORMAL LOW (ref 150–400)
RBC: 3.15 MIL/uL — ABNORMAL LOW (ref 3.87–5.11)
RDW: 17.1 % — ABNORMAL HIGH (ref 11.5–15.5)
WBC: 4.8 10*3/uL (ref 4.0–10.5)
nRBC: 0 % (ref 0.0–0.2)

## 2023-08-27 LAB — SURGICAL PATHOLOGY

## 2023-08-27 MED ORDER — ENOXAPARIN SODIUM 40 MG/0.4ML IJ SOSY
40.0000 mg | PREFILLED_SYRINGE | INTRAMUSCULAR | Status: DC
Start: 2023-08-27 — End: 2023-09-03
  Administered 2023-08-27 – 2023-09-02 (×7): 40 mg via SUBCUTANEOUS
  Filled 2023-08-27 (×7): qty 0.4

## 2023-08-27 MED ORDER — BISACODYL 10 MG RE SUPP
10.0000 mg | Freq: Once | RECTAL | Status: AC
  Administered 2023-08-27: 10 mg via RECTAL
  Filled 2023-08-27: qty 1

## 2023-08-27 NOTE — Plan of Care (Signed)

## 2023-08-27 NOTE — Plan of Care (Signed)
   Problem: Education: Goal: Knowledge of General Education information will improve Description Including pain rating scale, medication(s)/side effects and non-pharmacologic comfort measures Outcome: Progressing   Problem: Health Behavior/Discharge Planning: Goal: Ability to manage health-related needs will improve Outcome: Progressing

## 2023-08-27 NOTE — Progress Notes (Signed)
 Critical lab value calcium  5.0 K+ 2.2 MD notified.

## 2023-08-27 NOTE — Plan of Care (Signed)
  Problem: Activity: Goal: Risk for activity intolerance will decrease Outcome: Progressing   Problem: Elimination: Goal: Will not experience complications related to urinary retention Outcome: Completed/Met

## 2023-08-27 NOTE — Progress Notes (Signed)
 GYN Oncology Progress Note  6 Days Post-Op Procedure(s) (LRB): ROBOTIC ASSISTED TOTAL LAPAROSCOPY HYSTERECTOMY WITH BILATERAL SALPINGO-OOPHORECTOMY, RADICAL DISSECTION FOR DEBULKING (N/A) OMENTECTOMY (N/A) SMALL BOWEL RESECTION WITH SIDE-TO-SIDE, FUNCTIONAL END-TO-END ANASTOMOSIS (N/A)  Subjective: Patient reports feeling of pressure in the mid chest that started this am. No pain radiating from this area. No dyspnea, lightheadedness. No nausea or emesis reported with NG tube in place. Hoping to get this removed today. Had one episode of flatus last night but none since and no BM. No pain reported at this time.   Objective: Vital signs in last 24 hours: Temp:  [97.8 F (36.6 C)-98.4 F (36.9 C)] 97.9 F (36.6 C) (04/28 0526) Pulse Rate:  [61-68] 68 (04/28 0526) Resp:  [16-18] 18 (04/28 0526) BP: (146-161)/(63-76) 146/76 (04/28 0526) SpO2:  [97 %-99 %] 98 % (04/28 0526) Last BM Date : 08/23/23  Intake/Output from previous day: 04/27 0701 - 04/28 0700 In: 1324.5 [I.V.:1087.5; IV Piggyback:237] Out: 1150 [Urine:750; Emesis/NG output:400]  Physical Examination: General: alert, cooperative, and no distress Resp: clear to auscultation bilaterally Cardio: regular rate and rhythm, S1, S2 normal, no murmur, click, rub or gallop GI: incision: incisions to the abdomen with dermabond intact with no active drainage or erythema. Abdomen is tympanic, NT, hypoactive bowel sounds, NG to intermittent suction. Extremities: extremities normal, atraumatic, no cyanosis or edema  Labs: WBC/Hgb/Hct/Plts:  4.8/7.8/25.0/95 (04/28 0526) BUN/Cr/glu/ALT/AST/amyl/lip:  6/0.38/--/--/--/--/-- (04/28 0526)  CT AP 08/27/2023:  1. Interval laparoscopic hysterectomy and bilateral oophorectomy, with collection of gas and fluid at the surgical bed likely representing postoperative seroma/hematoma. Continued follow-up recommended to exclude developing abscess. Residual pneumoperitoneum and extensive abdominal wall gas  consistent with recent laparoscopic procedure. 2. Findings consistent with developing small bowel obstruction, with segmental dilatation of the mid jejunum measuring up to 4 cm. Continued radiographic follow-up recommended. 3. Small volume ascites throughout the abdomen and pelvis, with high attenuation fluid layering dependently in the pelvis likely representing residual blood products after surgery. 4. Stable right perirectal soft tissue nodule. 5. Trace bilateral pleural effusions and minimal dependent lower lobe atelectasis. 6.  Aortic Atherosclerosis  Assessment: 78 y.o. s/p Procedure(s): ROBOTIC ASSISTED TOTAL LAPAROSCOPY HYSTERECTOMY WITH BILATERAL SALPINGO-OOPHORECTOMY, RADICAL DISSECTION FOR DEBULKING, OMENTECTOMY, SMALL BOWEL RESECTION WITH SIDE-TO-SIDE, FUNCTIONAL END-TO-END ANASTOMOSIS: stable Pain:  Pain is controlled on prn medications.  Heme:  Asymptomatic acute on chronic anemia. Hgb 7.8 and Hct 25 this am. PLT 95   CV: Hypertension: controlled. Current treatment:  amlodipine  (Norvasc ) and propranolol . Continue to monitor with routine vital signs. Given chest pressure, plan for repeat EKG  GI: Postop ileus. CT AP performed 08/27/23 with concern for developing SBO. NGT with 400 cc output from 21:00 to 5 am. No output documented before that time.  WBC/VS normal.     FEN: Repeat Bmet ordered given several abnormal values on Bmet from 5:26 am. Repeat back to baseline with no critical values.  Prophylaxis: SCDs. Lovenox  on hold given PLT count  Plan: Repeat Bmet ordered this am to follow up on earlier critical labs given several outliers. Repeat Bmet at baseline with no critical values.  Continue NGT EKG Serial labs Encouraged ambulation If no progress over next day or two, consider initiating TPN per Dr. Daisey Dryer   LOS: 6 days    Suellyn Emory, MD 08/27/2023, 8:45 AM

## 2023-08-28 LAB — CBC
HCT: 24.1 % — ABNORMAL LOW (ref 36.0–46.0)
Hemoglobin: 7.8 g/dL — ABNORMAL LOW (ref 12.0–15.0)
MCH: 24.8 pg — ABNORMAL LOW (ref 26.0–34.0)
MCHC: 32.4 g/dL (ref 30.0–36.0)
MCV: 76.8 fL — ABNORMAL LOW (ref 80.0–100.0)
Platelets: 107 10*3/uL — ABNORMAL LOW (ref 150–400)
RBC: 3.14 MIL/uL — ABNORMAL LOW (ref 3.87–5.11)
RDW: 16.9 % — ABNORMAL HIGH (ref 11.5–15.5)
WBC: 4.3 10*3/uL (ref 4.0–10.5)
nRBC: 0 % (ref 0.0–0.2)

## 2023-08-28 LAB — BASIC METABOLIC PANEL WITH GFR
Anion gap: 7 (ref 5–15)
BUN: 9 mg/dL (ref 8–23)
CO2: 26 mmol/L (ref 22–32)
Calcium: 8.8 mg/dL — ABNORMAL LOW (ref 8.9–10.3)
Chloride: 105 mmol/L (ref 98–111)
Creatinine, Ser: 0.72 mg/dL (ref 0.44–1.00)
GFR, Estimated: >60 mL/min (ref >60)
Glucose, Bld: 122 mg/dL — ABNORMAL HIGH (ref 70–99)
Potassium: 3.9 mmol/L (ref 3.5–5.1)
Sodium: 138 mmol/L (ref 135–145)

## 2023-08-28 MED ORDER — AMLODIPINE BESYLATE 10 MG PO TABS
10.0000 mg | ORAL_TABLET | Freq: Every day | ORAL | Status: DC
  Administered 2023-08-29 – 2023-09-03 (×5): 10 mg via ORAL
  Filled 2023-08-28 (×6): qty 1

## 2023-08-28 MED ORDER — LEVOTHYROXINE SODIUM 50 MCG PO TABS
50.0000 ug | ORAL_TABLET | Freq: Every day | ORAL | Status: DC
Start: 2023-08-29 — End: 2023-09-03
  Administered 2023-08-29 – 2023-09-03 (×6): 50 ug via ORAL
  Filled 2023-08-28 (×6): qty 1

## 2023-08-28 MED ORDER — PROPRANOLOL HCL 10 MG PO TABS
20.0000 mg | ORAL_TABLET | Freq: Two times a day (BID) | ORAL | Status: DC
Start: 2023-08-29 — End: 2023-09-03
  Administered 2023-08-29 – 2023-09-03 (×8): 20 mg via ORAL
  Filled 2023-08-28 (×10): qty 2

## 2023-08-28 NOTE — Progress Notes (Signed)
 Mobility Specialist - Progress Note   08/28/23 1426  Mobility  Activity Ambulated with assistance in hallway  Level of Assistance Modified independent, requires aide device or extra time  Assistive Device Front wheel walker  Distance Ambulated (ft) 275 ft  Activity Response Tolerated well  Mobility Referral Yes  Mobility visit 1 Mobility  Mobility Specialist Start Time (ACUTE ONLY) 1406  Mobility Specialist Stop Time (ACUTE ONLY) 1421  Mobility Specialist Time Calculation (min) (ACUTE ONLY) 15 min   Pt received in bed and agreeable to mobility. No complaints during session. Upon return, pt requested assistance to the bathroom. Pt to bed after session with all needs met.    [facility]

## 2023-08-28 NOTE — Progress Notes (Signed)
 GYN Oncology Progress Note  7 Days Post-Op Procedure(s) (LRB): ROBOTIC ASSISTED TOTAL LAPAROSCOPY HYSTERECTOMY WITH BILATERAL SALPINGO-OOPHORECTOMY, RADICAL DISSECTION FOR DEBULKING (N/A) OMENTECTOMY (N/A) SMALL BOWEL RESECTION WITH SIDE-TO-SIDE, FUNCTIONAL END-TO-END ANASTOMOSIS (N/A)  Subjective: Patient reports doing well this am. No nausea or emesis reported. Reports passing flatus. Voiding without difficulty. Has sat in the chair today with plans for a walk this afternoon. Had small BM after suppository yesterday. Denies chest pain, dyspnea. Chest pressure is slightly improved and feels this is from the presence of the NG tube. No pain reported at this time.   Objective: Vital signs in last 24 hours: Temp:  [98 F (36.7 C)-98.5 F (36.9 C)] 98 F (36.7 C) (04/29 0450) Pulse Rate:  [65-73] 73 (04/29 0450) Resp:  [16-18] 16 (04/29 0450) BP: (140-156)/(68-84) 140/71 (04/29 0450) SpO2:  [95 %-99 %] 95 % (04/29 0450) Last BM Date : 08/23/23  Intake/Output from previous day: 04/28 0701 - 04/29 0700 In: 1770.5 [P.O.:240; I.V.:1410.5; NG/GT:120] Out: 850 [Urine:400; Emesis/NG output:450]  Physical Examination: General: alert, cooperative, and no distress Resp: clear to auscultation bilaterally Cardio: regular rate and rhythm, S1, S2 normal, no murmur, click, rub or gallop GI: incision: incisions to the abdomen with dermabond intact with no active drainage or erythema. Abdomen is less tympanic, NT, active bowel sounds, NG to intermittent suction with output light brown. Extremities: extremities normal, atraumatic, no cyanosis or edema  Labs: WBC/Hgb/Hct/Plts:  4.3/7.8/24.1/107 (04/29 0349) BUN/Cr/glu/ALT/AST/amyl/lip:  9/0.72/--/--/--/--/-- (04/29 0349)  CT AP 08/27/2023:  1. Interval laparoscopic hysterectomy and bilateral oophorectomy, with collection of gas and fluid at the surgical bed likely representing postoperative seroma/hematoma. Continued follow-up recommended to exclude  developing abscess. Residual pneumoperitoneum and extensive abdominal wall gas consistent with recent laparoscopic procedure. 2. Findings consistent with developing small bowel obstruction, with segmental dilatation of the mid jejunum measuring up to 4 cm. Continued radiographic follow-up recommended. 3. Small volume ascites throughout the abdomen and pelvis, with high attenuation fluid layering dependently in the pelvis likely representing residual blood products after surgery. 4. Stable right perirectal soft tissue nodule. 5. Trace bilateral pleural effusions and minimal dependent lower lobe atelectasis. 6.  Aortic Atherosclerosis  Assessment: 78 y.o. s/p Procedure(s): ROBOTIC ASSISTED TOTAL LAPAROSCOPY HYSTERECTOMY WITH BILATERAL SALPINGO-OOPHORECTOMY, RADICAL DISSECTION FOR DEBULKING, OMENTECTOMY, SMALL BOWEL RESECTION WITH SIDE-TO-SIDE, FUNCTIONAL END-TO-END ANASTOMOSIS: stable Pain:  Pain is controlled on prn medications.  Heme:  Asymptomatic acute on chronic anemia. Hgb 7.8 and Hct 24.1 this am. PLT 107   CV: Hypertension: controlled. Current treatment:  amlodipine  (Norvasc ) and propranolol . Continue to monitor with routine vital signs. No changes on recent EKG.  GI: Postop ileus. CT AP performed 08/27/23 with concern for developing SBO. NGT with 450 cc output from 4/28. WBC/VS normal.     FEN: No critical values.  Prophylaxis: SCDs. Lovenox  ordered  Plan: NG tube clamping trial today for 4 hours-strict NPO at that time. Encouraged ambulation. Continue with current plan of care.  AM labs ordered   LOS: 7 days    Suellyn Emory, MD 08/28/2023, 9:25 AM

## 2023-08-29 ENCOUNTER — Encounter: Payer: Self-pay | Admitting: Oncology

## 2023-08-29 LAB — BASIC METABOLIC PANEL WITH GFR
Anion gap: 12 (ref 5–15)
BUN: 11 mg/dL (ref 8–23)
CO2: 22 mmol/L (ref 22–32)
Calcium: 8.7 mg/dL — ABNORMAL LOW (ref 8.9–10.3)
Chloride: 104 mmol/L (ref 98–111)
Creatinine, Ser: 0.84 mg/dL (ref 0.44–1.00)
GFR, Estimated: >60 mL/min (ref >60)
Glucose, Bld: 75 mg/dL (ref 70–99)
Potassium: 3.6 mmol/L (ref 3.5–5.1)
Sodium: 138 mmol/L (ref 135–145)

## 2023-08-29 LAB — CBC
HCT: 24.2 % — ABNORMAL LOW (ref 36.0–46.0)
Hemoglobin: 7.6 g/dL — ABNORMAL LOW (ref 12.0–15.0)
MCH: 25.1 pg — ABNORMAL LOW (ref 26.0–34.0)
MCHC: 31.4 g/dL (ref 30.0–36.0)
MCV: 79.9 fL — ABNORMAL LOW (ref 80.0–100.0)
Platelets: 121 10*3/uL — ABNORMAL LOW (ref 150–400)
RBC: 3.03 MIL/uL — ABNORMAL LOW (ref 3.87–5.11)
RDW: 16.8 % — ABNORMAL HIGH (ref 11.5–15.5)
WBC: 4.2 10*3/uL (ref 4.0–10.5)
nRBC: 0 % (ref 0.0–0.2)

## 2023-08-29 MED ORDER — SODIUM CHLORIDE 0.9 % IV SOLN: INTRAVENOUS | Status: DC

## 2023-08-29 MED ORDER — TRAMADOL HCL 50 MG PO TABS
50.0000 mg | ORAL_TABLET | Freq: Four times a day (QID) | ORAL | Status: DC | PRN
  Administered 2023-08-29 – 2023-09-03 (×12): 50 mg via ORAL
  Filled 2023-08-29 (×12): qty 1

## 2023-08-29 NOTE — Progress Notes (Signed)
 GYN Oncology Progress Note  8 Days Post-Op Procedure(s) (LRB): ROBOTIC ASSISTED TOTAL LAPAROSCOPY HYSTERECTOMY WITH BILATERAL SALPINGO-OOPHORECTOMY, RADICAL DISSECTION FOR DEBULKING (N/A) OMENTECTOMY (N/A) SMALL BOWEL RESECTION WITH SIDE-TO-SIDE, FUNCTIONAL END-TO-END ANASTOMOSIS (N/A)  Subjective: Patient reports doing well this am. No nausea or emesis reported. Reports large amounts of passing flatus. No BM reported. Has walked in the halls with plans to sit in chair. Chest pressure improved after NG tube removed. No pain reported. Continues voiding without difficulty. Denies chest pain, dyspnea.   Objective: Vital signs in last 24 hours: Temp:  [98 F (36.7 C)-98.9 F (37.2 C)] 98 F (36.7 C) (04/30 0644) Pulse Rate:  [60-68] 63 (04/30 0644) Resp:  [16-18] 18 (04/30 0644) BP: (134-151)/(71-82) 134/71 (04/30 0644) SpO2:  [98 %-100 %] 98 % (04/30 0644) Weight:  [145 lb 11.6 oz (66.1 kg)] 145 lb 11.6 oz (66.1 kg) (04/30 0500) Last BM Date : 08/27/23  Intake/Output from previous day: 04/29 0701 - 04/30 0700 In: 240 [P.O.:240] Out: 500 [Urine:500]  Physical Examination: General: alert, cooperative, and no distress Resp: clear to auscultation bilaterally Cardio: regular rate and rhythm, S1, S2 normal, no murmur, click, rub or gallop GI: incision: incisions to the abdomen with dermabond intact with no active drainage or erythema. Abdomen is less tympanic, NT, active bowel sounds, soft Extremities: extremities normal, atraumatic, no cyanosis or edema  Labs: WBC/Hgb/Hct/Plts:  4.2/7.6/24.2/121 (04/30 0518) BUN/Cr/glu/ALT/AST/amyl/lip:  11/0.84/--/--/--/--/-- (04/30 0518)  CT AP 08/27/2023:  1. Interval laparoscopic hysterectomy and bilateral oophorectomy, with collection of gas and fluid at the surgical bed likely representing postoperative seroma/hematoma. Continued follow-up recommended to exclude developing abscess. Residual pneumoperitoneum and extensive abdominal wall gas  consistent with recent laparoscopic procedure. 2. Findings consistent with developing small bowel obstruction, with segmental dilatation of the mid jejunum measuring up to 4 cm. Continued radiographic follow-up recommended. 3. Small volume ascites throughout the abdomen and pelvis, with high attenuation fluid layering dependently in the pelvis likely representing residual blood products after surgery. 4. Stable right perirectal soft tissue nodule. 5. Trace bilateral pleural effusions and minimal dependent lower lobe atelectasis. 6.  Aortic Atherosclerosis  Assessment: 78 y.o. s/p Procedure(s): ROBOTIC ASSISTED TOTAL LAPAROSCOPY HYSTERECTOMY WITH BILATERAL SALPINGO-OOPHORECTOMY, RADICAL DISSECTION FOR DEBULKING, OMENTECTOMY, SMALL BOWEL RESECTION WITH SIDE-TO-SIDE, FUNCTIONAL END-TO-END ANASTOMOSIS: stable Pain:  Pain is controlled on prn medications.  Heme:  Asymptomatic acute on chronic anemia. Hgb 7.6 and Hct 24.2 this am. PLT 121   CV: Hypertension: controlled. Current treatment:  amlodipine  (Norvasc ) and propranolol . Continue to monitor with routine vital signs. No changes on recent EKG.  GI: Postop ileus. CT AP performed 08/27/23 with concern for developing SBO. NGT removed after successful clamping trial on 4/29. WBC/VS normal.     FEN: No critical values.  Prophylaxis: SCDs. Lovenox  ordered  Plan: On clear liquids. Advance to fulls if tolerated Encouraged ambulation. Continue with current plan of care   LOS: 8 days    Catherine Emory, MD 08/29/2023, 7:41 AM

## 2023-08-29 NOTE — Plan of Care (Signed)
  Problem: Clinical Measurements: Goal: Diagnostic test results will improve Outcome: Progressing   Problem: Coping: Goal: Level of anxiety will decrease Outcome: Progressing   Problem: Pain Managment: Goal: General experience of comfort will improve and/or be controlled Outcome: Progressing

## 2023-08-29 NOTE — Progress Notes (Signed)
 Requested ER/PR and Her2 Neu testing on accession [id number] on sample D (peritoneal biopsy) or E (small bowel resection) with [facility] via email.

## 2023-08-29 NOTE — Progress Notes (Signed)
 Mobility Specialist - Progress Note   08/29/23 1025  Mobility  Activity Ambulated with assistance in hallway;Ambulated with assistance to bathroom  Level of Assistance Standby assist, set-up cues, supervision of patient - no hands on  Assistive Device Front wheel walker  Distance Ambulated (ft) 275 ft  Activity Response Tolerated well  Mobility Referral Yes  Mobility visit 1 Mobility  Mobility Specialist Start Time (ACUTE ONLY) 1008  Mobility Specialist Stop Time (ACUTE ONLY) 1025  Mobility Specialist Time Calculation (min) (ACUTE ONLY) 17 min   Pt received in bed and agreeable to mobility. Prior to ambulating, pt requested assistance to the bathroom. No complaints during session. Pt to recliner after session with all needs met.    [facility]

## 2023-08-30 ENCOUNTER — Encounter: Payer: Self-pay | Admitting: Oncology

## 2023-08-30 NOTE — Progress Notes (Addendum)
 Entered in error

## 2023-08-30 NOTE — Plan of Care (Signed)
   Problem: Education: Goal: Knowledge of General Education information will improve Description Including pain rating scale, medication(s)/side effects and non-pharmacologic comfort measures Outcome: Progressing   Problem: Health Behavior/Discharge Planning: Goal: Ability to manage health-related needs will improve Outcome: Progressing

## 2023-08-30 NOTE — Plan of Care (Signed)
   Problem: Clinical Measurements: Goal: Diagnostic test results will improve Outcome: Progressing   Problem: Activity: Goal: Risk for activity intolerance will decrease Outcome: Progressing   Problem: Pain Managment: Goal: General experience of comfort will improve and/or be controlled Outcome: Progressing   Problem: Safety: Goal: Ability to remain free from injury will improve Outcome: Progressing

## 2023-08-30 NOTE — Progress Notes (Signed)
 GYN Oncology Progress Note  9 Days Post-Op Procedure(s) (LRB): ROBOTIC ASSISTED TOTAL LAPAROSCOPY HYSTERECTOMY WITH BILATERAL SALPINGO-OOPHORECTOMY, RADICAL DISSECTION FOR DEBULKING (N/A) OMENTECTOMY (N/A) SMALL BOWEL RESECTION WITH SIDE-TO-SIDE, FUNCTIONAL END-TO-END ANASTOMOSIS (N/A)  Subjective: Patient reports doing well this am. No nausea or emesis reported. Reports large amounts of passing flatus. No BM reported. Has walked in the halls with plans to sit in chair. Chest pressure improved after NG tube removed. No pain reported. Continues voiding without difficulty. Denies chest pain, dyspnea.   Objective: Vital signs in last 24 hours: Temp:  [97.7 F (36.5 C)-98.5 F (36.9 C)] 98.5 F (36.9 C) (05/01 0556) Pulse Rate:  [55-69] 55 (05/01 0556) Resp:  [15-16] 15 (05/01 0556) BP: (128-141)/(65-72) 129/65 (05/01 0556) SpO2:  [99 %-100 %] 99 % (05/01 0556) Weight:  [147 lb 4.3 oz (66.8 kg)] 147 lb 4.3 oz (66.8 kg) (05/01 0500) Last BM Date : 08/29/23  Intake/Output from previous day: 04/30 0701 - 05/01 0700 In: 1941.6 [P.O.:1310; I.V.:631.6] Out: 900 [Urine:900]  Physical Examination: General: alert, cooperative, and no distress Resp: clear to auscultation bilaterally Cardio: regular rate and rhythm, S1, S2 normal, no murmur, click, rub or gallop GI: incision: incisions to the abdomen with dermabond intact with no active drainage or erythema. Abdomen is soft, non-distended, active bowel sounds, soft Extremities: extremities normal, atraumatic, no cyanosis or edema  Labs: CBC    Component Value Date/Time   WBC 4.2 08/29/2023 0518   RBC 3.03 (L) 08/29/2023 0518   HGB 7.6 (L) 08/29/2023 0518   HGB 9.3 (L) 08/13/2023 1010   HCT 24.2 (L) 08/29/2023 0518   PLT 121 (L) 08/29/2023 0518   PLT 122 (L) 08/13/2023 1010   MCV 79.9 (L) 08/29/2023 0518   MCH 25.1 (L) 08/29/2023 0518   MCHC 31.4 08/29/2023 0518   RDW 16.8 (H) 08/29/2023 0518   LYMPHSABS 0.8 08/26/2023 1540    MONOABS 0.5 08/26/2023 1540   EOSABS 0.1 08/26/2023 1540   BASOSABS 0.0 08/26/2023 1540  BMET    Component Value Date/Time   NA 138 08/29/2023 0518   NA 139 11/22/2020 0000   K 3.6 08/29/2023 0518   CL 104 08/29/2023 0518   CO2 22 08/29/2023 0518   GLUCOSE 75 08/29/2023 0518   BUN 11 08/29/2023 0518   CREATININE 0.84 08/29/2023 0518   CREATININE 0.79 08/13/2023 1010   CREATININE 1.05 (H) 05/19/2019 1034   CALCIUM  8.7 (L) 08/29/2023 0518   GFRNONAA >60 08/29/2023 0518   GFRNONAA >60 08/13/2023 1010   GFRNONAA 48 (L) 01/23/2018 0804     CT AP 08/27/2023:  1. Interval laparoscopic hysterectomy and bilateral oophorectomy, with collection of gas and fluid at the surgical bed likely representing postoperative seroma/hematoma. Continued follow-up recommended to exclude developing abscess. Residual pneumoperitoneum and extensive abdominal wall gas consistent with recent laparoscopic procedure. 2. Findings consistent with developing small bowel obstruction, with segmental dilatation of the mid jejunum measuring up to 4 cm. Continued radiographic follow-up recommended. 3. Small volume ascites throughout the abdomen and pelvis, with high attenuation fluid layering dependently in the pelvis likely representing residual blood products after surgery. 4. Stable right perirectal soft tissue nodule. 5. Trace bilateral pleural effusions and minimal dependent lower lobe atelectasis. 6.  Aortic Atherosclerosis  Assessment: 78 y.o. s/p Procedure(s): ROBOTIC ASSISTED TOTAL LAPAROSCOPY HYSTERECTOMY WITH BILATERAL SALPINGO-OOPHORECTOMY, RADICAL DISSECTION FOR DEBULKING, OMENTECTOMY, SMALL BOWEL RESECTION WITH SIDE-TO-SIDE, FUNCTIONAL END-TO-END ANASTOMOSIS: stable Pain:  Pain is controlled on prn medications.  Heme:  Asymptomatic acute on chronic anemia.  Hgb 7.6 and Hct 24.2 yesterday am. PLT 121. Labs overall stable.  CV: Hypertension: controlled. Current treatment:  amlodipine  (Norvasc ) and  propranolol . Continue to monitor with routine vital signs. No changes on recent EKG. Bradycardic-chronic, asymptomatic.  GI: Postop ileus. CT AP performed 08/27/23 with concern for developing SBO. NGT removed after successful clamping trial on 4/29. WBC/VS normal. Passing moderate amount of flatus.    FEN: No critical values on 08/29/2023.  Prophylaxis: SCDs. Lovenox  ordered  Plan: Diet advanced. Encouraged ambulation. Continue with current plan of care.   LOS: 9 days    Suellyn Emory, MD 08/30/2023, 7:20 AM

## 2023-08-31 NOTE — Plan of Care (Signed)
  Problem: Education: Goal: Knowledge of General Education information will improve Description: Including pain rating scale, medication(s)/side effects and non-pharmacologic comfort measures Outcome: Progressing   Problem: Health Behavior/Discharge Planning: Goal: Ability to manage health-related needs will improve Outcome: Progressing   Problem: Clinical Measurements: Goal: Ability to maintain clinical measurements within normal limits will improve Outcome: Progressing Goal: Will remain free from infection Outcome: Progressing Goal: Diagnostic test results will improve Outcome: Progressing Goal: Respiratory complications will improve Outcome: Progressing Goal: Cardiovascular complication will be avoided Outcome: Progressing   Problem: Activity: Goal: Risk for activity intolerance will decrease Outcome: Progressing   Problem: Nutrition: Goal: Adequate nutrition will be maintained Outcome: Progressing   Problem: Coping: Goal: Level of anxiety will decrease Outcome: Progressing   Problem: Elimination: Goal: Will not experience complications related to bowel motility Outcome: Progressing   Problem: Pain Managment: Goal: General experience of comfort will improve and/or be controlled Outcome: Progressing   Problem: Safety: Goal: Ability to remain free from injury will improve Outcome: Progressing   Problem: Skin Integrity: Goal: Risk for impaired skin integrity will decrease Outcome: Progressing   Problem: Education: Goal: Knowledge of the prescribed therapeutic regimen will improve Outcome: Progressing Goal: Understanding of sexual limitations or changes related to disease process or condition will improve Outcome: Progressing Goal: Individualized Educational Video(s) Outcome: Progressing   Problem: Self-Concept: Goal: Communication of feelings regarding changes in body function or appearance will improve Outcome: Progressing   Problem: Skin  Integrity: Goal: Demonstration of wound healing without infection will improve Outcome: Progressing

## 2023-08-31 NOTE — Progress Notes (Signed)
 Mobility Specialist - Progress Note   08/31/23 0920  Mobility  Activity Ambulated with assistance in hallway  Level of Assistance Standby assist, set-up cues, supervision of patient - no hands on  Assistive Device Front wheel walker  Distance Ambulated (ft) 275 ft  Activity Response Tolerated well  Mobility Referral Yes  Mobility visit 1 Mobility  Mobility Specialist Start Time (ACUTE ONLY) 0908  Mobility Specialist Stop Time (ACUTE ONLY) 0920  Mobility Specialist Time Calculation (min) (ACUTE ONLY) 12 min   Pt received in bed and agreeable to mobility. Prior to ambulating, pt requested assistance to the bathroom. No complaints during session. Pt to recliner after session with all needs met.    [facility]

## 2023-08-31 NOTE — Progress Notes (Signed)
 GYN Oncology Progress Note  10 Days Post-Op Procedure(s) (LRB): ROBOTIC ASSISTED TOTAL LAPAROSCOPY HYSTERECTOMY WITH BILATERAL SALPINGO-OOPHORECTOMY, RADICAL DISSECTION FOR DEBULKING (N/A) OMENTECTOMY (N/A) SMALL BOWEL RESECTION WITH SIDE-TO-SIDE, FUNCTIONAL END-TO-END ANASTOMOSIS (N/A)  Subjective: Patient reports doing well this am. No nausea or emesis reported. Tolerating her diet and eating smaller amounts. Reports passing moderate amounts of passing flatus. No BM reported. Has walked in the halls and sat in chair. No pain reported. Continues voiding without difficulty. Denies chest pain, dyspnea, dizziness, lightheadedness.  Objective: Vital signs in last 24 hours: Temp:  [98 F (36.7 C)-98.1 F (36.7 C)] 98.1 F (36.7 C) (05/02 0612) Pulse Rate:  [53-64] 53 (05/02 0612) Resp:  [16] 16 (05/02 0612) BP: (124-140)/(63-72) 125/65 (05/02 0612) SpO2:  [98 %-100 %] 98 % (05/02 0612) Weight:  [144 lb 10 oz (65.6 kg)] 144 lb 10 oz (65.6 kg) (05/02 0500) Last BM Date : 08/29/23  Intake/Output from previous day: 05/01 0701 - 05/02 0700 In: 1320 [P.O.:1320] Out: 1350 [Urine:1350]  Physical Examination: General: alert, cooperative, and no distress Resp: clear to auscultation bilaterally Cardio: regular rate and rhythm, S1, S2 normal, no murmur, click, rub or gallop GI: incision: incisions to the abdomen with dermabond intact with no active drainage or erythema. Abdomen is soft, non-distended, active bowel sounds, soft Extremities: extremities normal, atraumatic, no cyanosis or edema  Labs: CBC    Component Value Date/Time   WBC 4.2 08/29/2023 0518   RBC 3.03 (L) 08/29/2023 0518   HGB 7.6 (L) 08/29/2023 0518   HGB 9.3 (L) 08/13/2023 1010   HCT 24.2 (L) 08/29/2023 0518   PLT 121 (L) 08/29/2023 0518   PLT 122 (L) 08/13/2023 1010   MCV 79.9 (L) 08/29/2023 0518   MCH 25.1 (L) 08/29/2023 0518   MCHC 31.4 08/29/2023 0518   RDW 16.8 (H) 08/29/2023 0518   LYMPHSABS 0.8 08/26/2023  1540   MONOABS 0.5 08/26/2023 1540   EOSABS 0.1 08/26/2023 1540   BASOSABS 0.0 08/26/2023 1540  BMET    Component Value Date/Time   NA 138 08/29/2023 0518   NA 139 11/22/2020 0000   K 3.6 08/29/2023 0518   CL 104 08/29/2023 0518   CO2 22 08/29/2023 0518   GLUCOSE 75 08/29/2023 0518   BUN 11 08/29/2023 0518   CREATININE 0.84 08/29/2023 0518   CREATININE 0.79 08/13/2023 1010   CREATININE 1.05 (H) 05/19/2019 1034   CALCIUM  8.7 (L) 08/29/2023 0518   GFRNONAA >60 08/29/2023 0518   GFRNONAA >60 08/13/2023 1010   GFRNONAA 48 (L) 01/23/2018 0804     CT AP 08/27/2023:  1. Interval laparoscopic hysterectomy and bilateral oophorectomy, with collection of gas and fluid at the surgical bed likely representing postoperative seroma/hematoma. Continued follow-up recommended to exclude developing abscess. Residual pneumoperitoneum and extensive abdominal wall gas consistent with recent laparoscopic procedure. 2. Findings consistent with developing small bowel obstruction, with segmental dilatation of the mid jejunum measuring up to 4 cm. Continued radiographic follow-up recommended. 3. Small volume ascites throughout the abdomen and pelvis, with high attenuation fluid layering dependently in the pelvis likely representing residual blood products after surgery. 4. Stable right perirectal soft tissue nodule. 5. Trace bilateral pleural effusions and minimal dependent lower lobe atelectasis. 6.  Aortic Atherosclerosis  Assessment: 78 y.o. s/p Procedure(s): ROBOTIC ASSISTED TOTAL LAPAROSCOPY HYSTERECTOMY WITH BILATERAL SALPINGO-OOPHORECTOMY, RADICAL DISSECTION FOR DEBULKING, OMENTECTOMY, SMALL BOWEL RESECTION WITH SIDE-TO-SIDE, FUNCTIONAL END-TO-END ANASTOMOSIS: stable Pain:  Pain is controlled on prn medications.  Heme:  Asymptomatic acute on chronic anemia. Hgb  7.6 and Hct 24.2 4/30 am. PLT 121. Labs overall stable.  CV: Hypertension: controlled. Current treatment:  amlodipine  (Norvasc ) and  propranolol . Continue to monitor with routine vital signs. No changes on recent EKG. Bradycardic-chronic, asymptomatic.  GI: Postop ileus. CT AP performed 08/27/23 with concern for developing SBO. NGT removed after successful clamping trial on 4/29. WBC/VS normal. Passing moderate amount of flatus.    FEN: No critical values on 08/29/2023.  Prophylaxis: SCDs. Lovenox  ordered  Plan: Continue with diet as tolerated. Encouraged ambulation. Continue with current plan of care. Plan for discharge as soon as tomorrow if able to have a BM and meeting milestones.   LOS: 10 days    Suellyn Emory, MD 08/31/2023, 9:02 AM

## 2023-08-31 NOTE — Progress Notes (Signed)
 GYN Oncology Progress Note  Patient is alert, oriented, resting in bed, with sister at the bedside.  She just ate a caesar salad and states her abdomen is starting to cramp slightly.  She continues to pass flatus intermittently.  No nausea or emesis reported.  No bowel movement.  Also reports having a mild headache and has asked her nurse for a pain pill.  Continues to ambulate in the hall with assistance.  No needs voiced at this time.  Continue with current plan of care.

## 2023-09-01 LAB — BASIC METABOLIC PANEL WITH GFR
Anion gap: 11 (ref 5–15)
BUN: 11 mg/dL (ref 8–23)
CO2: 25 mmol/L (ref 22–32)
Calcium: 8.7 mg/dL — ABNORMAL LOW (ref 8.9–10.3)
Chloride: 101 mmol/L (ref 98–111)
Creatinine, Ser: 0.7 mg/dL (ref 0.44–1.00)
GFR, Estimated: >60 mL/min (ref >60)
Glucose, Bld: 86 mg/dL (ref 70–99)
Potassium: 3.3 mmol/L — ABNORMAL LOW (ref 3.5–5.1)
Sodium: 137 mmol/L (ref 135–145)

## 2023-09-01 LAB — CBC
HCT: 23.8 % — ABNORMAL LOW (ref 36.0–46.0)
Hemoglobin: 7.6 g/dL — ABNORMAL LOW (ref 12.0–15.0)
MCH: 24.7 pg — ABNORMAL LOW (ref 26.0–34.0)
MCHC: 31.9 g/dL (ref 30.0–36.0)
MCV: 77.3 fL — ABNORMAL LOW (ref 80.0–100.0)
Platelets: 148 10*3/uL — ABNORMAL LOW (ref 150–400)
RBC: 3.08 MIL/uL — ABNORMAL LOW (ref 3.87–5.11)
RDW: 16.6 % — ABNORMAL HIGH (ref 11.5–15.5)
WBC: 4.2 10*3/uL (ref 4.0–10.5)
nRBC: 0 % (ref 0.0–0.2)

## 2023-09-01 MED ORDER — POTASSIUM CHLORIDE CRYS ER 20 MEQ PO TBCR
40.0000 meq | EXTENDED_RELEASE_TABLET | Freq: Once | ORAL | Status: AC
  Administered 2023-09-01: 40 meq via ORAL
  Filled 2023-09-01: qty 2

## 2023-09-01 MED ORDER — POLYETHYLENE GLYCOL 3350 17 G PO PACK
17.0000 g | PACK | Freq: Every day | ORAL | Status: DC
  Administered 2023-09-01: 17 g via ORAL
  Filled 2023-09-01: qty 1

## 2023-09-01 NOTE — Progress Notes (Signed)
 GYN Oncology Progress Note  11 Days Post-Op Procedure(s) (LRB): ROBOTIC ASSISTED TOTAL LAPAROSCOPY HYSTERECTOMY WITH BILATERAL SALPINGO-OOPHORECTOMY, RADICAL DISSECTION FOR DEBULKING (N/A) OMENTECTOMY (N/A) SMALL BOWEL RESECTION WITH SIDE-TO-SIDE, FUNCTIONAL END-TO-END ANASTOMOSIS (N/A)  Subjective: Overall doing well. Pain well controlled. Denies nausea or vomiting. Tolerating regular diet but eating about 25% of meals to avoid getting too full or getting too crampy. Continues to have good flatus but no BM. Walking. Voiding without issue.    Objective: Vital signs in last 24 hours: Temp:  [97.9 F (36.6 C)-98 F (36.7 C)] 98 F (36.7 C) (05/03 0611) Pulse Rate:  [56-66] 58 (05/03 0611) Resp:  [15-18] 17 (05/03 0611) BP: (122-138)/(65-72) 138/66 (05/03 0611) SpO2:  [98 %-100 %] 100 % (05/03 0611) Weight:  [140 lb 6.9 oz (63.7 kg)] 140 lb 6.9 oz (63.7 kg) (05/03 0500) Last BM Date : 08/29/23 (per patient)  Intake/Output from previous day: 05/02 0701 - 05/03 0700 In: 1080 [P.O.:1080] Out: 1150 [Urine:1150]  Physical Examination: General: alert, cooperative, and no distress Resp: clear to auscultation bilaterally Cardio: regular rate and rhythm, S1, S2 normal, no murmur, click, rub or gallop GI: incision: incisions to the abdomen with dermabond intact with no active drainage or erythema. Abdomen is soft, non-distended, active bowel sounds, soft Extremities: extremities normal, atraumatic, no cyanosis or edema  Labs: CBC    Component Value Date/Time   WBC 4.2 09/01/2023 0246   RBC 3.08 (L) 09/01/2023 0246   HGB 7.6 (L) 09/01/2023 0246   HGB 9.3 (L) 08/13/2023 1010   HCT 23.8 (L) 09/01/2023 0246   PLT 148 (L) 09/01/2023 0246   PLT 122 (L) 08/13/2023 1010   MCV 77.3 (L) 09/01/2023 0246   MCH 24.7 (L) 09/01/2023 0246   MCHC 31.9 09/01/2023 0246   RDW 16.6 (H) 09/01/2023 0246   LYMPHSABS 0.8 08/26/2023 1540   MONOABS 0.5 08/26/2023 1540   EOSABS 0.1 08/26/2023 1540    BASOSABS 0.0 08/26/2023 1540  BMET    Component Value Date/Time   NA 137 09/01/2023 0246   NA 139 11/22/2020 0000   K 3.3 (L) 09/01/2023 0246   CL 101 09/01/2023 0246   CO2 25 09/01/2023 0246   GLUCOSE 86 09/01/2023 0246   BUN 11 09/01/2023 0246   CREATININE 0.70 09/01/2023 0246   CREATININE 0.79 08/13/2023 1010   CREATININE 1.05 (H) 05/19/2019 1034   CALCIUM  8.7 (L) 09/01/2023 0246   GFRNONAA >60 09/01/2023 0246   GFRNONAA >60 08/13/2023 1010   GFRNONAA 48 (L) 01/23/2018 0804     CT AP 08/27/2023:  1. Interval laparoscopic hysterectomy and bilateral oophorectomy, with collection of gas and fluid at the surgical bed likely representing postoperative seroma/hematoma. Continued follow-up recommended to exclude developing abscess. Residual pneumoperitoneum and extensive abdominal wall gas consistent with recent laparoscopic procedure. 2. Findings consistent with developing small bowel obstruction, with segmental dilatation of the mid jejunum measuring up to 4 cm. Continued radiographic follow-up recommended. 3. Small volume ascites throughout the abdomen and pelvis, with high attenuation fluid layering dependently in the pelvis likely representing residual blood products after surgery. 4. Stable right perirectal soft tissue nodule. 5. Trace bilateral pleural effusions and minimal dependent lower lobe atelectasis. 6.  Aortic Atherosclerosis  Assessment: 78 y.o. s/p Procedure(s): ROBOTIC ASSISTED TOTAL LAPAROSCOPY HYSTERECTOMY WITH BILATERAL SALPINGO-OOPHORECTOMY, RADICAL DISSECTION FOR DEBULKING, OMENTECTOMY, SMALL BOWEL RESECTION WITH SIDE-TO-SIDE, FUNCTIONAL END-TO-END ANASTOMOSIS: stable Pain:  Pain is controlled on prn medications.  Heme:  Asymptomatic acute on chronic anemia. Hgb stable. Platelets improving.  CV:  Hypertension: controlled. Current treatment:  amlodipine  (Norvasc ) and propranolol . Continue to monitor with routine vital signs. No changes on recent EKG.  Bradycardic-chronic, asymptomatic.  GI: Postop ileus. CT AP performed 08/27/23 with concern for developing SBO. NGT removed after successful clamping trial on 4/29. WBC/VS normal. Passing moderate amount of flatus.  Will add miralax  today to help aid in having BM. Could consider a trial of steroids if no improvement.  FEN: No critical values on 08/29/2023.  Prophylaxis: SCDs. Lovenox  ordered  Plan: Continue with diet as tolerated. Miralax  added today.  Encouraged ambulation. Continue with current plan of care. Plan for discharge as soon as tomorrow if able to have a BM and meeting milestones.   LOS: 11 days    Derrel Flies, MD 09/01/2023, 8:18 AM

## 2023-09-01 NOTE — Progress Notes (Signed)
 Mobility Specialist - Progress Note   09/01/23 0926  Mobility  Activity Ambulated with assistance in hallway  Level of Assistance Modified independent, requires aide device or extra time  Assistive Device Front wheel walker  Distance Ambulated (ft) 275 ft  Activity Response Tolerated well  Mobility Referral Yes  Mobility visit 1 Mobility  Mobility Specialist Start Time (ACUTE ONLY) [id number]  Mobility Specialist Stop Time (ACUTE ONLY) 0925  Mobility Specialist Time Calculation (min) (ACUTE ONLY) 21 min   Pt received in bed and agreeable to mobility. No complaints during session. Pt to recliner after session with all needs met.    [facility]

## 2023-09-02 LAB — BASIC METABOLIC PANEL WITH GFR
Anion gap: 10 (ref 5–15)
BUN: 10 mg/dL (ref 8–23)
CO2: 26 mmol/L (ref 22–32)
Calcium: 9 mg/dL (ref 8.9–10.3)
Chloride: 103 mmol/L (ref 98–111)
Creatinine, Ser: 0.73 mg/dL (ref 0.44–1.00)
GFR, Estimated: >60 mL/min (ref >60)
Glucose, Bld: 148 mg/dL — ABNORMAL HIGH (ref 70–99)
Potassium: 3.7 mmol/L (ref 3.5–5.1)
Sodium: 139 mmol/L (ref 135–145)

## 2023-09-02 MED ORDER — POLYETHYLENE GLYCOL 3350 17 G PO PACK
17.0000 g | PACK | Freq: Two times a day (BID) | ORAL | Status: DC
  Administered 2023-09-02 – 2023-09-03 (×3): 17 g via ORAL
  Filled 2023-09-02 (×3): qty 1

## 2023-09-02 NOTE — Progress Notes (Signed)
 GYN Oncology Progress Note  12 Days Post-Op Procedure(s) (LRB): ROBOTIC ASSISTED TOTAL LAPAROSCOPY HYSTERECTOMY WITH BILATERAL SALPINGO-OOPHORECTOMY, RADICAL DISSECTION FOR DEBULKING (N/A) OMENTECTOMY (N/A) SMALL BOWEL RESECTION WITH SIDE-TO-SIDE, FUNCTIONAL END-TO-END ANASTOMOSIS (N/A)  Subjective: Overall doing well. Pain well controlled. Denies nausea or vomiting. Tolerating regular diet and feels that is able to tolerate a little more food each day. Continues to have good flatus. One BM today with a few hard balls of stool. Walking. Voiding without issue.    Objective: Vital signs in last 24 hours: Temp:  [97.8 F (36.6 C)-98.4 F (36.9 C)] 97.8 F (36.6 C) (05/04 0534) Pulse Rate:  [56-65] 56 (05/04 0534) Resp:  [16-18] 18 (05/04 0534) BP: (136-139)/(63-67) 139/67 (05/04 0534) SpO2:  [99 %-100 %] 99 % (05/04 0534) Weight:  [132 lb 11.5 oz (60.2 kg)] 132 lb 11.5 oz (60.2 kg) (05/04 0445) Last BM Date : 08/29/23  Intake/Output from previous day: 05/03 0701 - 05/04 0700 In: 960 [P.O.:960] Out: 1650 [Urine:1650]  Physical Examination: General: alert, cooperative, and no distress Resp: clear to auscultation bilaterally Cardio: regular rate and rhythm, S1, S2 normal, no murmur, click, rub or gallop GI: incision: incisions to the abdomen with dermabond intact with no active drainage or erythema. Abdomen is soft, non-distended, active bowel sounds, soft Extremities: extremities normal, atraumatic, no cyanosis or edema  Labs: CBC    Component Value Date/Time   WBC 4.2 09/01/2023 0246   RBC 3.08 (L) 09/01/2023 0246   HGB 7.6 (L) 09/01/2023 0246   HGB 9.3 (L) 08/13/2023 1010   HCT 23.8 (L) 09/01/2023 0246   PLT 148 (L) 09/01/2023 0246   PLT 122 (L) 08/13/2023 1010   MCV 77.3 (L) 09/01/2023 0246   MCH 24.7 (L) 09/01/2023 0246   MCHC 31.9 09/01/2023 0246   RDW 16.6 (H) 09/01/2023 0246   LYMPHSABS 0.8 08/26/2023 1540   MONOABS 0.5 08/26/2023 1540   EOSABS 0.1 08/26/2023  1540   BASOSABS 0.0 08/26/2023 1540  BMET    Component Value Date/Time   NA 137 09/01/2023 0246   NA 139 11/22/2020 0000   K 3.3 (L) 09/01/2023 0246   CL 101 09/01/2023 0246   CO2 25 09/01/2023 0246   GLUCOSE 86 09/01/2023 0246   BUN 11 09/01/2023 0246   CREATININE 0.70 09/01/2023 0246   CREATININE 0.79 08/13/2023 1010   CREATININE 1.05 (H) 05/19/2019 1034   CALCIUM  8.7 (L) 09/01/2023 0246   GFRNONAA >60 09/01/2023 0246   GFRNONAA >60 08/13/2023 1010   GFRNONAA 48 (L) 01/23/2018 0804     CT AP 08/27/2023:  1. Interval laparoscopic hysterectomy and bilateral oophorectomy, with collection of gas and fluid at the surgical bed likely representing postoperative seroma/hematoma. Continued follow-up recommended to exclude developing abscess. Residual pneumoperitoneum and extensive abdominal wall gas consistent with recent laparoscopic procedure. 2. Findings consistent with developing small bowel obstruction, with segmental dilatation of the mid jejunum measuring up to 4 cm. Continued radiographic follow-up recommended. 3. Small volume ascites throughout the abdomen and pelvis, with high attenuation fluid layering dependently in the pelvis likely representing residual blood products after surgery. 4. Stable right perirectal soft tissue nodule. 5. Trace bilateral pleural effusions and minimal dependent lower lobe atelectasis. 6.  Aortic Atherosclerosis  Assessment: 78 y.o. s/p Procedure(s): ROBOTIC ASSISTED TOTAL LAPAROSCOPY HYSTERECTOMY WITH BILATERAL SALPINGO-OOPHORECTOMY, RADICAL DISSECTION FOR DEBULKING, OMENTECTOMY, SMALL BOWEL RESECTION WITH SIDE-TO-SIDE, FUNCTIONAL END-TO-END ANASTOMOSIS: stable Pain:  Pain is controlled on prn medications.  Heme:  CBC has been stable. Will discontinue following.  CV: Hypertension: controlled. Current treatment:  amlodipine  (Norvasc ) and propranolol . Continue to monitor with routine vital signs. No changes on recent EKG. Bradycardic-chronic,  asymptomatic.  GI: Postop ileus. CT AP performed 08/27/23 with concern for developing SBO. NGT removed after successful clamping trial on 4/29. WBC/VS normal. Passing moderate amount of flatus.  One small hard BM. Will increase Miralax  to BID.   FEN: BMP pending.  Prophylaxis: SCDs. Lovenox  ordered  Plan: BMP pending this AM. Continue with diet as tolerated. Miralax  increased to BID. Encouraged ambulation. Continue with current plan of care. Plan for discharge as soon as tomorrow if continues to do well.    LOS: 12 days    Derrel Flies, MD 09/02/2023, 7:44 AM

## 2023-09-02 NOTE — Progress Notes (Signed)
 Mobility Specialist - Progress Note   09/02/23 0837  Mobility  Activity Ambulated with assistance in hallway;Ambulated with assistance to bathroom  Level of Assistance Modified independent, requires aide device or extra time  Assistive Device Front wheel walker  Distance Ambulated (ft) 275 ft  Activity Response Tolerated well  Mobility Referral Yes  Mobility visit 1 Mobility  Mobility Specialist Start Time (ACUTE ONLY) [id number]  Mobility Specialist Stop Time (ACUTE ONLY) 0837  Mobility Specialist Time Calculation (min) (ACUTE ONLY) 18 min   Pt received in bed and agreeable to mobility. Prior to ambulating, pt requested assistance to the bathroom. No complaints during session. Pt to recliner after session with all needs met.   [facility]

## 2023-09-03 ENCOUNTER — Ambulatory Visit: Admitting: Psychiatry

## 2023-09-03 ENCOUNTER — Other Ambulatory Visit: Payer: Self-pay | Admitting: Oncology

## 2023-09-03 LAB — BASIC METABOLIC PANEL WITH GFR
Anion gap: 10 (ref 5–15)
BUN: 11 mg/dL (ref 8–23)
CO2: 26 mmol/L (ref 22–32)
Calcium: 9 mg/dL (ref 8.9–10.3)
Chloride: 103 mmol/L (ref 98–111)
Creatinine, Ser: 0.67 mg/dL (ref 0.44–1.00)
GFR, Estimated: >60 mL/min (ref >60)
Glucose, Bld: 99 mg/dL (ref 70–99)
Potassium: 3.8 mmol/L (ref 3.5–5.1)
Sodium: 139 mmol/L (ref 135–145)

## 2023-09-03 MED ORDER — HEPARIN SOD (PORK) LOCK FLUSH 100 UNIT/ML IV SOLN
500.0000 [IU] | INTRAVENOUS | Status: AC | PRN
  Administered 2023-09-03: 500 [IU]

## 2023-09-03 NOTE — Plan of Care (Signed)
  Problem: Education: Goal: Knowledge of General Education information will improve Description: Including pain rating scale, medication(s)/side effects and non-pharmacologic comfort measures Outcome: Progressing   Problem: Health Behavior/Discharge Planning: Goal: Ability to manage health-related needs will improve Outcome: Progressing   Problem: Clinical Measurements: Goal: Ability to maintain clinical measurements within normal limits will improve Outcome: Progressing Goal: Will remain free from infection Outcome: Progressing Goal: Diagnostic test results will improve Outcome: Progressing Goal: Respiratory complications will improve Outcome: Progressing Goal: Cardiovascular complication will be avoided Outcome: Progressing   Problem: Activity: Goal: Risk for activity intolerance will decrease Outcome: Progressing   Problem: Nutrition: Goal: Adequate nutrition will be maintained Outcome: Progressing   Problem: Coping: Goal: Level of anxiety will decrease Outcome: Progressing   Problem: Elimination: Goal: Will not experience complications related to bowel motility Outcome: Progressing   Problem: Pain Managment: Goal: General experience of comfort will improve and/or be controlled Outcome: Progressing   Problem: Safety: Goal: Ability to remain free from injury will improve Outcome: Progressing   Problem: Skin Integrity: Goal: Risk for impaired skin integrity will decrease Outcome: Progressing   Problem: Education: Goal: Knowledge of the prescribed therapeutic regimen will improve Outcome: Progressing Goal: Understanding of sexual limitations or changes related to disease process or condition will improve Outcome: Progressing Goal: Individualized Educational Video(s) Outcome: Progressing   Problem: Self-Concept: Goal: Communication of feelings regarding changes in body function or appearance will improve Outcome: Progressing   Problem: Skin  Integrity: Goal: Demonstration of wound healing without infection will improve Outcome: Progressing

## 2023-09-03 NOTE — Progress Notes (Signed)
 Gynecologic Oncology Multi-Disciplinary Disposition Conference Note  Date of the Conference: 09/03/2023  Patient Name: Catherine Lowery  Primary GYN Oncologist: Dr. 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 Disposition is for chemotherapy to treat the metastatic lobular breast cancer..   This Multidisciplinary conference took place involving physicians from Gynecologic Oncology, Medical Oncology, Radiation Oncology, Pathology, Radiology along with the Gynecologic Oncology Nurse Practitioner and Gynecologic Oncology Nurse Navigator.  Comprehensive assessment of the patient's malignancy, staging, need for surgery, chemotherapy, radiation therapy, and need for further testing were reviewed. Supportive measures, both inpatient and following discharge were also discussed. The recommended plan of care is documented. Greater than 35 minutes were spent correlating and coordinating this patient's care.

## 2023-09-03 NOTE — Progress Notes (Signed)
 Discharge paperwork printed and reviewed with the pt. Pt verbalized understanding and no concerns made. Family present in the room and will provide transportation to home.

## 2023-09-03 NOTE — Discharge Summary (Signed)
 Physician Discharge Summary  Patient ID: Catherine Lowery MRN: [id number] DOB/AGE: 09-28-1945 78 y.o.  Admit date: 08/21/2023 Discharge date: 09/03/2023  Admission Diagnoses: Ovarian cancer [facility])  Discharge Diagnoses:  Principal Problem:   Ovarian cancer [facility])   Discharged Condition:  The patient is in good condition and stable for discharge.    Hospital Course: On 08/21/2023, the patient underwent the following: Procedure(s): ROBOTIC ASSISTED TOTAL LAPAROSCOPY HYSTERECTOMY WITH BILATERAL SALPINGO-OOPHORECTOMY, RADICAL DISSECTION FOR DEBULKING OMENTECTOMY, SMALL BOWEL RESECTION WITH SIDE-TO-SIDE, FUNCTIONAL END-TO-END ANASTOMOSIS. The postoperative course was overall uneventful with slowed return of bowel function.  She was discharged to home on postoperative day 13 tolerating a regular diet with no nausea or emesis, ambulating, voiding, passing flatus regularly, pain minimal and controlled with oral medications.   Consults: None  Significant Diagnostic Studies: Labs  Treatments: Surgery: see above  Discharge Exam (from am assessment): Blood pressure (!) 122/53, pulse 61, temperature (!) 97.5 F (36.4 C), temperature source Oral, resp. rate 18, height 5' 9.5" (1.765 m), weight 134 lb 11.2 oz (61.1 kg), last menstrual period 01/24/2000, SpO2 100%. General: alert, cooperative, and no distress Resp: clear to auscultation bilaterally Cardio: regular rate and rhythm, S1, S2 normal, no murmur, click, rub or gallop GI: incision: incisions to the abdomen with dermabond intact with no active drainage or erythema. Abdomen is soft, non-distended, active bowel sounds Extremities: extremities normal, atraumatic, no cyanosis or edema  Disposition: Discharge disposition: 01-Home or Self Care       Discharge Instructions     Call MD for:  difficulty breathing, headache or visual disturbances   Complete by: As directed    Call MD for:  extreme fatigue   Complete by: As directed    Call  MD for:  hives   Complete by: As directed    Call MD for:  persistant dizziness or light-headedness   Complete by: As directed    Call MD for:  persistant nausea and vomiting   Complete by: As directed    Call MD for:  redness, tenderness, or signs of infection (pain, swelling, redness, odor or green/yellow discharge around incision site)   Complete by: As directed    Call MD for:  severe uncontrolled pain   Complete by: As directed    Call MD for:  temperature >100.4   Complete by: As directed    Diet - low sodium heart healthy   Complete by: As directed    Driving Restrictions   Complete by: As directed    No driving for 4-09 days after surgery when the following criteria have been met: Do not take narcotics and drive. You need to make sure your reaction time has returned.   Increase activity slowly   Complete by: As directed    Lifting restrictions   Complete by: As directed    No lifting greater than 10 lbs, pushing, pulling, straining for 6 weeks.   Sexual Activity Restrictions   Complete by: As directed    No sexual activity, nothing in the vagina, for 12 weeks.      Allergies as of 09/03/2023       Reactions   Morphine And Codeine Itching, Nausea And Vomiting   Percocet [oxycodone -acetaminophen ] Itching, Nausea And Vomiting        Medication List     TAKE these medications    albuterol  108 (90 Base) MCG/ACT inhaler Commonly known as: VENTOLIN  HFA Inhale 2 puffs into the lungs every 4 (four) hours as needed for wheezing or  shortness of breath.   amLODipine  10 MG tablet Commonly known as: NORVASC  Take 1 tablet (10 mg total) by mouth daily.   aspirin  325 MG tablet Commonly known as: Aspirin  Adult enteric coated; may use OTC What changed: Another medication with the same name was removed. Continue taking this medication, and follow the directions you see here.   atorvastatin  40 MG tablet Commonly known as: LIPITOR Take 1 tablet (40 mg total) by mouth daily.    bisacodyl  5 MG EC tablet Commonly known as: DULCOLAX Take 2 tablets (10 mg total) by mouth daily as needed for moderate constipation.   dexamethasone  4 MG tablet Commonly known as: DECADRON  Take 2 tabs at the night before and 2 tab the morning of chemotherapy, every 3 weeks, by mouth x 6 cycles   levothyroxine  50 MCG tablet Commonly known as: SYNTHROID  Take 1 tablet (50 mcg total) by mouth daily.   lidocaine -prilocaine  cream Commonly known as: EMLA  Apply to affected area once What changed:  how much to take how to take this when to take this reasons to take this additional instructions   losartan  25 MG tablet Commonly known as: COZAAR  Take 25 mg by mouth daily.   multivitamin with minerals Tabs tablet Take 1 tablet by mouth daily.   ondansetron  8 MG tablet Commonly known as: Zofran  Take 1 tablet (8 mg total) by mouth every 8 (eight) hours as needed for nausea or vomiting. Start on the third day after carboplatin .   POTASSIUM PO Take 1 tablet by mouth daily.   primidone  50 MG tablet Commonly known as: MYSOLINE  Take 2 tablets (100 mg total) by mouth every morning.   prochlorperazine  10 MG tablet Commonly known as: COMPAZINE  Take 1 tablet (10 mg total) by mouth every 6 (six) hours as needed for nausea or vomiting.   propranolol  40 MG tablet Commonly known as: INDERAL  Take 0.5 tablets (20 mg total) by mouth 2 (two) times daily. What changed: when to take this   senna-docusate 8.6-50 MG tablet Commonly known as: Senokot-S Take 2 tablets by mouth at bedtime. For AFTER surgery, do not take if having diarrhea   traMADol  50 MG tablet Commonly known as: ULTRAM  Take 1 tablet (50 mg total) by mouth every 6 (six) hours as needed for severe pain (pain score 7-10). For AFTER surgery only, do not take and drive   Trelegy Ellipta 100-62.5-25 MCG/ACT Aepb Generic drug: Fluticasone -Umeclidin-Vilant Inhale 1 puff into the lungs daily.   Voltaren 1 % Gel Generic drug:  diclofenac Sodium APPLY TOPICALLY 4 TIMES DAILY AS NEEDED What changed: See the new instructions.        Follow-up Information     Derrel Flies, MD Follow up on 09/10/2023.   Specialty: Gynecologic Oncology Why: at 2:45pm at the [facility] information: [address] [name] [address] [phone]                 Greater than thirty minutes were spend for face to face discharge instructions and discharge orders/summary in EPIC.   Signed: Suellyn Emory 09/03/2023, 2:04 PM

## 2023-09-03 NOTE — TOC Progression Note (Signed)
 Transition of Care [facility]) - Progression Note    Patient Details  Name: Catherine Lowery MRN: [id number] Date of Birth: Nov 19, 1945  Transition of Care [facility]) CM/SW Contact  Delilah Fend, LCSW Phone Number: 09/03/2023, 9:49 AM  Clinical Narrative:     TOC continues to monitor for potential dc planning needs.       Expected Discharge Plan and Services                                               Social Determinants of Health (SDOH) Interventions SDOH Screenings   Food Insecurity: No Food Insecurity (08/21/2023)  Housing: Low Risk  (08/21/2023)  Transportation Needs: No Transportation Needs (08/21/2023)  Utilities: Not At Risk (08/21/2023)  Alcohol Screen: Low Risk  (03/22/2020)  Depression (PHQ2-9): Low Risk  (03/22/2020)  Financial Resource Strain: Low Risk  (01/28/2020)  Social Connections: Moderately Integrated (08/21/2023)  Tobacco Use: Medium Risk (08/21/2023)    Readmission Risk Interventions    08/23/2023   10:51 AM  Readmission Risk Prevention Plan  Transportation Screening Complete  PCP or Specialist Appt within 5-7 Days Complete  Home Care Screening Complete  Medication Review (RN CM) Complete

## 2023-09-03 NOTE — Progress Notes (Signed)
 GYN Oncology Progress Note  13 Days Post-Op Procedure(s) (LRB): ROBOTIC ASSISTED TOTAL LAPAROSCOPY HYSTERECTOMY WITH BILATERAL SALPINGO-OOPHORECTOMY, RADICAL DISSECTION FOR DEBULKING (N/A) OMENTECTOMY (N/A) SMALL BOWEL RESECTION WITH SIDE-TO-SIDE, FUNCTIONAL END-TO-END ANASTOMOSIS (N/A)  Subjective: Overall doing well. Pain well controlled. Denies nausea or vomiting. Tolerating regular diet. Continues to have good flatus. One BM on Sunday with a few hard balls of stool, none since. Walking. Voiding without issue.   Objective: Vital signs in last 24 hours: Temp:  [97.5 F (36.4 C)-97.9 F (36.6 C)] 97.5 F (36.4 C) (05/05 0605) Pulse Rate:  [61-66] 61 (05/05 0605) Resp:  [15-19] 15 (05/05 0605) BP: (122-143)/(49-69) 140/69 (05/05 0605) SpO2:  [98 %-100 %] 98 % (05/05 0605) Weight:  [134 lb 11.2 oz (61.1 kg)] 134 lb 11.2 oz (61.1 kg) (05/05 0500) Last BM Date : 09/01/23  Intake/Output from previous day: 05/04 0701 - 05/05 0700 In: 1536 [P.O.:1536] Out: 1250 [Urine:1250]  Physical Examination: General: alert, cooperative, and no distress Resp: clear to auscultation bilaterally Cardio: regular rate and rhythm, S1, S2 normal, no murmur, click, rub or gallop GI: incision: incisions to the abdomen with dermabond intact with no active drainage or erythema. Abdomen is soft, non-distended, active bowel sounds Extremities: extremities normal, atraumatic, no cyanosis or edema  Labs: CBC    Component Value Date/Time   WBC 4.2 09/01/2023 0246   RBC 3.08 (L) 09/01/2023 0246   HGB 7.6 (L) 09/01/2023 0246   HGB 9.3 (L) 08/13/2023 1010   HCT 23.8 (L) 09/01/2023 0246   PLT 148 (L) 09/01/2023 0246   PLT 122 (L) 08/13/2023 1010   MCV 77.3 (L) 09/01/2023 0246   MCH 24.7 (L) 09/01/2023 0246   MCHC 31.9 09/01/2023 0246   RDW 16.6 (H) 09/01/2023 0246   LYMPHSABS 0.8 08/26/2023 1540   MONOABS 0.5 08/26/2023 1540   EOSABS 0.1 08/26/2023 1540   BASOSABS 0.0 08/26/2023 1540  BMET     Component Value Date/Time   NA 139 09/03/2023 0307   NA 139 11/22/2020 0000   K 3.8 09/03/2023 0307   CL 103 09/03/2023 0307   CO2 26 09/03/2023 0307   GLUCOSE 99 09/03/2023 0307   BUN 11 09/03/2023 0307   CREATININE 0.67 09/03/2023 0307   CREATININE 0.79 08/13/2023 1010   CREATININE 1.05 (H) 05/19/2019 1034   CALCIUM  9.0 09/03/2023 0307   GFRNONAA >60 09/03/2023 0307   GFRNONAA >60 08/13/2023 1010   GFRNONAA 48 (L) 01/23/2018 0804     CT AP 08/27/2023:  1. Interval laparoscopic hysterectomy and bilateral oophorectomy, with collection of gas and fluid at the surgical bed likely representing postoperative seroma/hematoma. Continued follow-up recommended to exclude developing abscess. Residual pneumoperitoneum and extensive abdominal wall gas consistent with recent laparoscopic procedure. 2. Findings consistent with developing small bowel obstruction, with segmental dilatation of the mid jejunum measuring up to 4 cm. Continued radiographic follow-up recommended. 3. Small volume ascites throughout the abdomen and pelvis, with high attenuation fluid layering dependently in the pelvis likely representing residual blood products after surgery. 4. Stable right perirectal soft tissue nodule. 5. Trace bilateral pleural effusions and minimal dependent lower lobe atelectasis. 6.  Aortic Atherosclerosis  Assessment: 78 y.o. s/p Procedure(s): ROBOTIC ASSISTED TOTAL LAPAROSCOPY HYSTERECTOMY WITH BILATERAL SALPINGO-OOPHORECTOMY, RADICAL DISSECTION FOR DEBULKING, OMENTECTOMY, SMALL BOWEL RESECTION WITH SIDE-TO-SIDE, FUNCTIONAL END-TO-END ANASTOMOSIS: stable Pain:  Pain is controlled on prn medications.  Heme:  CBC has been stable. Will discontinue following.  CV: Hypertension: controlled. Current treatment:  amlodipine  (Norvasc ) and propranolol . Continue to monitor with  routine vital signs. No changes on recent EKG. Bradycardic-chronic, asymptomatic.  GI: Postop ileus. CT AP performed 08/27/23 with  concern for developing SBO. NGT removed after successful clamping trial on 4/29. WBC/VS normal. Passing moderate amount of flatus.  One small hard BM on Sunday. Miralax  increased to BID yesterday.   FEN: BMP normal this am.  Prophylaxis: SCDs. Lovenox   Plan: Continue with diet as tolerated. Continue with Miralax  BID. Encouraged ambulation. Continue with current plan of care.   LOS: 13 days    Suellyn Emory, MD 09/03/2023, 8:13 AM

## 2023-09-03 NOTE — Progress Notes (Signed)
 Mobility Specialist - Progress Note   09/03/23 1014  Mobility  Activity Ambulated with assistance in hallway  Level of Assistance Modified independent, requires aide device or extra time  Assistive Device Front wheel walker  Distance Ambulated (ft) 250 ft  Activity Response Tolerated well  Mobility Referral Yes  Mobility visit 1 Mobility  Mobility Specialist Start Time (ACUTE ONLY) 1008  Mobility Specialist Stop Time (ACUTE ONLY) 1014  Mobility Specialist Time Calculation (min) (ACUTE ONLY) 6 min   Pt received in bed and agreeable to mobility. No complaints during session. Pt to bathroom after session with all needs met.   [facility]

## 2023-09-04 ENCOUNTER — Encounter: Payer: Self-pay | Admitting: Hematology and Oncology

## 2023-09-04 ENCOUNTER — Other Ambulatory Visit: Payer: Self-pay | Admitting: Hematology and Oncology

## 2023-09-04 ENCOUNTER — Telehealth: Payer: Self-pay | Admitting: Hematology and Oncology

## 2023-09-04 ENCOUNTER — Telehealth: Payer: Self-pay

## 2023-09-04 ENCOUNTER — Encounter: Payer: Self-pay | Admitting: Oncology

## 2023-09-04 ENCOUNTER — Telehealth: Payer: Self-pay | Admitting: Oncology

## 2023-09-04 NOTE — Telephone Encounter (Signed)
 Left a message advising that her appointments for chemotherapy, port lab/flush and appointment with Dr. Marton Sleeper on 09/20/23 have been canceled.  Let her know that a scheduler will be calling with appointments to see Dr. Arno Bibles.  Advised her to call back with any questions.

## 2023-09-04 NOTE — Progress Notes (Signed)
 This encounter was created in error - please disregard (patient rescheduled).

## 2023-09-04 NOTE — Telephone Encounter (Signed)
 Spoke with Catherine Lowery this morning. She states she is eating, drinking and urinating well. She has not had a BM yet but is passing gas. She is taking Dulcolax as prescribed and encouraged her to drink plenty of water.Also, encouraged her to take Miralax  for her constipation. She denies fever or chills. Incisions are dry and intact. She rates her pain 3/10. Her pain is controlled with Tylenol     Instructed to call office with any fever, chills, purulent drainage, uncontrolled pain or any other questions or concerns. Patient verbalizes understanding.   Pt aware of post op appointments as well as the office number [phone] and after hours number [phone] to call if she has any questions or concerns

## 2023-09-04 NOTE — Progress Notes (Signed)
 Sent order requisition to [facility] for [employer] CDx testing (HRD) via email per Dr. Marton Sleeper.

## 2023-09-04 NOTE — Telephone Encounter (Signed)
 Lvm for [name] to call office regarding a post-op check since discharged from the hospital yesterday 5/5

## 2023-09-04 NOTE — Telephone Encounter (Signed)
 Left patient a vm regarding upcoming appointment

## 2023-09-10 ENCOUNTER — Encounter: Payer: Self-pay | Admitting: Psychiatry

## 2023-09-10 ENCOUNTER — Inpatient Hospital Stay: Attending: Psychiatry | Admitting: Psychiatry

## 2023-09-10 ENCOUNTER — Other Ambulatory Visit: Payer: Self-pay | Admitting: Gynecologic Oncology

## 2023-09-10 VITALS — BP 119/58 | HR 60 | Temp 98.1°F | Resp 16 | Ht 69.5 in | Wt 139.4 lb

## 2023-09-10 DIAGNOSIS — Z90710 Acquired absence of both cervix and uterus: Secondary | ICD-10-CM | POA: Diagnosis not present

## 2023-09-10 DIAGNOSIS — Z9079 Acquired absence of other genital organ(s): Secondary | ICD-10-CM

## 2023-09-10 DIAGNOSIS — C50311 Malignant neoplasm of lower-inner quadrant of right female breast: Secondary | ICD-10-CM

## 2023-09-10 DIAGNOSIS — Z7189 Other specified counseling: Secondary | ICD-10-CM

## 2023-09-10 DIAGNOSIS — M21371 Foot drop, right foot: Secondary | ICD-10-CM

## 2023-09-10 DIAGNOSIS — Z90722 Acquired absence of ovaries, bilateral: Secondary | ICD-10-CM

## 2023-09-10 DIAGNOSIS — Z170 Estrogen receptor positive status [ER+]: Secondary | ICD-10-CM

## 2023-09-10 DIAGNOSIS — C562 Malignant neoplasm of left ovary: Secondary | ICD-10-CM

## 2023-09-10 NOTE — Progress Notes (Signed)
 Urgent referral placed per Dr. Daisey Dryer for right foot drop.

## 2023-09-10 NOTE — Patient Instructions (Signed)
 It was a pleasure to see you in clinic today. - Healing well today - No heavy lifting until 6 weeks postop. Nothing in the vagina until 10wks postop - Return visit planned for 47mo  Thank you very much for allowing me to provide care for you today.  I appreciate your confidence in choosing our Gynecologic Oncology team at [facility].  If you have any questions about your visit today please call our office or send us  a MyChart message and we will get back to you as soon as possible.

## 2023-09-10 NOTE — Progress Notes (Signed)
 Gynecologic Oncology Return Clinic Visit  Date of Service: 09/10/2023 Referring Provider: Dr. Almeda Jacobs   Assessment & Plan: Catherine Lowery is a 78 y.o. woman with Stage IIIC who is s/p RA-TLH, BSO, radical dissection for tumor debulking, omentectomy, small bowel S2S functional end-to-end anastomosis on 08/21/23, postop course complicated by partial SBO, managed conservatively.  Final pathology with no residual high-grade serous carcinoma but with evidence of metastatic breast cancer.  Postop: - Pt recovering well from surgery and healing appropriately postoperatively - Ongoing postoperative expectations and precautions reviewed. Continue with no lifting >10lbs through 6 weeks postoperatively  Ovarian cancer: - Intraoperative findings and pathology results reviewed. - Reviewed typical treatment for ovarian cancer is to follow interval debulking surgery with adjuvant chemotherapy.  However, given no residual high-grade serous carcinoma but evidence of metastatic breast cancer on pathology, it appears that her ovarian cancer component has fully responded to treatment and with focus next treatment to target her breast cancer. - Reviewed typical surveillance of ovarian cancer.  Would often follow with exams every 3 months for the first 2 years.  Will plan for first follow-up in about 6 months allow patient to get through initial treatments for breast cancer as below. - CA125 is a marker for the patient.  We can continue to follow this.  Breast cancer - Reviewed final pathology showed evidence of metastatic breast cancer. - Patient has follow-up with Dr. Arno Bibles scheduled. - Path ER/PR+, HER2 negative.   Right foot drop - Reviewed that this may be due to surgery, stretch/pressure on nerves - Referral to PT  RTC 17mo.  Derrel Flies, MD Gynecologic Oncology   Medical Decision Making I personally spent  TOTAL 35 minutes face-to-face and non-face-to-face in the care of this patient, which  includes all pre, intra, and post visit time on the date of service. The discussion of management of ovarian and breast cancer is beyond the scope of routine postoperative care.   ----------------------- Reason for Visit: Postop/Treatment discussion  Treatment History: Oncology History  Malignant neoplasm of lower-inner quadrant of right breast of female, estrogen receptor positive (HCC)  03/18/2018 Initial Diagnosis   Screening mammography on 02/27/2019 revealed a right breast abnormality, Right breast lower inner quadrant biopsy for a clinical T2N0, [name], grade 2 or 3, estrogen and progesterone receptor positive, [name], with an MIB-1 of 5%   05/07/2018 Cancer Staging   Staging form: Breast, AJCC 8th Edition - Pathologic stage from 05/07/2018: Stage IB (pT2, pN21mi, cM0, G2, ER+, PR+, HER2-) - Signed by Percival Brace, NP on 04/07/2019   05/07/2018 Surgery   pT2 pN1, [name], grade 2, with a positive inferior margin.   05/07/2018 Oncotype testing   MammaPrint obtained from the definitive surgical sample read as "low risk" predicting a disease-free survival at 5 years of 97.8% without need of chemotherapy.   05/18/2018 Genetic Testing   The Common Hereditary Cancers Panel + Thyroid  Caner panel was ordered (55 genes): APC, [name], [name], [name], [name], [name], [name], [name], [name], [name], [name], [name], [name], [name], [name], [name], [name], [name], [name], [name], [name], KIT, MEN1, [name], [name], [name], [name], [name], [name], [name], [name], [name], [name], [name], [name], [name], POLE, [name], [name], RAD50, [name], [name], [name], [name], [name], [name], [name], [name], [name], [name], [name], [name], [name], [name], [name], VHL.  Results: No pathogenic variants identified.  2 variants of uncertain significance in the genes [name] c.1176C>T (Silent) and [name] c.1419+4C>T (Intronic) were identified.  The  date of this test report is  05/18/2018.   UPDATE: The [name] c.1419+4C>T (Intronic) VUS has been reclassified to "Likely Benign." The report date is 08/08/2018. UPDATE: The [name] c.1176C>T (Silent) VUS has been reclassified to "Likely Benign." The report date is 09/20/2022.    06/05/2018 Surgery   ([id number]) Reexcision of the right breast; all margins negative.   07/11/2018 - 08/27/2018 Radiation Therapy   The patient initially received a dose of 50.4 Gy in 28 fractions to the breast using whole-breast tangent fields with an additional 45 Gy in 25 fractions to the right axilla. This was delivered using a 3-D conformal technique. The pt received a boost delivering an additional 12 Gy in 6 fractions using a electron boost with electrons. The total dose was 107.4 Gy.    09/2018 - 09/2023 Anti-estrogen oral therapy   anastrozole    Left ovarian epithelial cancer (HCC)  05/08/2023 Initial Diagnosis   Left ovarian epithelial cancer (HCC)   05/08/2023 Imaging   IR IMAGING GUIDED PORT INSERTION Result Date: 05/10/2023 INDICATION: Concern for metastatic ovarian cancer. Please perform image guided biopsy of peritoneal mass for tissue diagnostic purposes. Please perform image guided Port a catheter placement for durable intravenous access for impending chemotherapy. EXAM: 1. IMPLANTED PORT A CATH PLACEMENT WITH ULTRASOUND AND FLUOROSCOPIC GUIDANCE 2. ULTRASOUND-GUIDED LEFT VENTRAL LATERAL PERITONEAL ABDOMINAL MASS BIOPSY COMPARISON:  Chest CT-05/09/2023 CT abdomen and pelvis-05/08/2023 MEDICATIONS: None ANESTHESIA/SEDATION: Moderate (conscious) sedation was employed during this procedure as administered by the Interventional Radiology RN. A total of Versed  3 mg and Fentanyl  125 mcg was administered intravenously. Moderate Sedation Time: 46 minutes. The patient's level of consciousness and vital signs were monitored continuously by radiology nursing throughout the procedure under my direct supervision. CONTRAST:  None FLUOROSCOPY TIME:  42  seconds (4 mGy) COMPLICATIONS: None immediate. PROCEDURE: The procedure, risks, benefits, and alternatives were explained to the patient. Questions regarding the procedure were encouraged and answered. The patient understands and consents to the procedure. The right neck and chest were prepped with chlorhexidine  in a sterile fashion, and a sterile drape was applied covering the operative field. Maximum barrier sterile technique with sterile gowns and gloves were used for the procedure. A timeout was performed prior to the initiation of the procedure. Local anesthesia was provided with 1% lidocaine  with epinephrine . After creating a small venotomy incision, a micropuncture kit was utilized to access the internal jugular vein. Real-time ultrasound guidance was utilized for vascular access including the acquisition of a permanent ultrasound image documenting patency of the accessed vessel. The microwire was utilized to measure appropriate catheter length. A subcutaneous port pocket was then created along the upper chest wall utilizing a combination of sharp and blunt dissection. The pocket was irrigated with sterile saline. A single lumen Slim sized power injectable port was chosen for placement. The 8 Fr catheter was tunneled from the port pocket site to the venotomy incision. The port was placed in the pocket. The external catheter was trimmed to appropriate length. At the venotomy, an 8 Fr peel-away sheath was placed over a guidewire under fluoroscopic guidance. The catheter was then placed through the sheath and the sheath was removed. Final catheter positioning was confirmed and documented with a fluoroscopic spot radiograph. The port was accessed with a Huber needle, aspirated and flushed with heparinized saline. The venotomy site was closed with an interrupted 4-0 Vicryl suture. The port pocket incision was closed with interrupted 2-0 Vicryl suture. Dermabond and Steri-strips were applied to both incisions.  Dressings  were applied. Attention was now paid towards image guided biopsy of dominant peritoneal mass. Sonographic evaluation performed of the ventral aspect of the left mid abdomen demonstrating an at least 8.3 x 2.1 cm hypoechoic macrolobulated mass (image 1), correlating with the mass seen on preceding abdominal CT image 50, series 3. The procedure was planned. The skin overlying the operative site was prepped and draped in usual sterile fashion. After the overlying soft tissues were anesthetized with 1% lidocaine  with epinephrine , a 17 gauge coaxial needle was advanced into the peripheral aspect of the mass. Next, under direct ultrasound guidance, 6 core needle biopsies were obtained yielding the return of adequate tissue. Samples were submitted to the laboratory for pathologic analysis. The coaxial needle was removed following the administration of a Gel-Foam slurry and superficial hemostasis achieved with manual compression. Postprocedural imaging was negative for complication, specifically, no significant Peri biopsy hematoma. A dressing was applied. The patient tolerated the above procedures well without immediate post procedural complication. FINDINGS: After catheter placement, the tip lies within the superior cavoatrial junction. The catheter aspirates and flushes normally and is ready for immediate use. Sonographic evaluation of the left mid abdomen demonstrates an at least 8.3 x 2.1 cm hypoechoic macrolobulated mass, correlating with the mass seen on preceding abdominal CT. IMPRESSION: 1. Successful placement of a right internal jugular approach power injectable Port-A-Cath. The catheter is ready for immediate use. 2. Successful ultrasound-guided biopsy of peritoneal mass within the ventral aspect of the left mid/lateral abdomen. Electronically Signed   By: Robbi Childs M.D.   On: 05/10/2023 14:59   IR US  Guide Bx Asp/Drain Result Date: 05/10/2023 INDICATION: Concern for metastatic ovarian cancer.  Please perform image guided biopsy of peritoneal mass for tissue diagnostic purposes. Please perform image guided Port a catheter placement for durable intravenous access for impending chemotherapy. EXAM: 1. IMPLANTED PORT A CATH PLACEMENT WITH ULTRASOUND AND FLUOROSCOPIC GUIDANCE 2. ULTRASOUND-GUIDED LEFT VENTRAL LATERAL PERITONEAL ABDOMINAL MASS BIOPSY COMPARISON:  Chest CT-05/09/2023 CT abdomen and pelvis-05/08/2023 MEDICATIONS: None ANESTHESIA/SEDATION: Moderate (conscious) sedation was employed during this procedure as administered by the Interventional Radiology RN. A total of Versed  3 mg and Fentanyl  125 mcg was administered intravenously. Moderate Sedation Time: 46 minutes. The patient's level of consciousness and vital signs were monitored continuously by radiology nursing throughout the procedure under my direct supervision. CONTRAST:  None FLUOROSCOPY TIME:  42 seconds (4 mGy) COMPLICATIONS: None immediate. PROCEDURE: The procedure, risks, benefits, and alternatives were explained to the patient. Questions regarding the procedure were encouraged and answered. The patient understands and consents to the procedure. The right neck and chest were prepped with chlorhexidine  in a sterile fashion, and a sterile drape was applied covering the operative field. Maximum barrier sterile technique with sterile gowns and gloves were used for the procedure. A timeout was performed prior to the initiation of the procedure. Local anesthesia was provided with 1% lidocaine  with epinephrine . After creating a small venotomy incision, a micropuncture kit was utilized to access the internal jugular vein. Real-time ultrasound guidance was utilized for vascular access including the acquisition of a permanent ultrasound image documenting patency of the accessed vessel. The microwire was utilized to measure appropriate catheter length. A subcutaneous port pocket was then created along the upper chest wall utilizing a combination of  sharp and blunt dissection. The pocket was irrigated with sterile saline. A single lumen Slim sized power injectable port was chosen for placement. The 8 Fr catheter was tunneled from the port pocket site to the venotomy  incision. The port was placed in the pocket. The external catheter was trimmed to appropriate length. At the venotomy, an 8 Fr peel-away sheath was placed over a guidewire under fluoroscopic guidance. The catheter was then placed through the sheath and the sheath was removed. Final catheter positioning was confirmed and documented with a fluoroscopic spot radiograph. The port was accessed with a Huber needle, aspirated and flushed with heparinized saline. The venotomy site was closed with an interrupted 4-0 Vicryl suture. The port pocket incision was closed with interrupted 2-0 Vicryl suture. Dermabond and Steri-strips were applied to both incisions. Dressings were applied. Attention was now paid towards image guided biopsy of dominant peritoneal mass. Sonographic evaluation performed of the ventral aspect of the left mid abdomen demonstrating an at least 8.3 x 2.1 cm hypoechoic macrolobulated mass (image 1), correlating with the mass seen on preceding abdominal CT image 50, series 3. The procedure was planned. The skin overlying the operative site was prepped and draped in usual sterile fashion. After the overlying soft tissues were anesthetized with 1% lidocaine  with epinephrine , a 17 gauge coaxial needle was advanced into the peripheral aspect of the mass. Next, under direct ultrasound guidance, 6 core needle biopsies were obtained yielding the return of adequate tissue. Samples were submitted to the laboratory for pathologic analysis. The coaxial needle was removed following the administration of a Gel-Foam slurry and superficial hemostasis achieved with manual compression. Postprocedural imaging was negative for complication, specifically, no significant Peri biopsy hematoma. A dressing was  applied. The patient tolerated the above procedures well without immediate post procedural complication. FINDINGS: After catheter placement, the tip lies within the superior cavoatrial junction. The catheter aspirates and flushes normally and is ready for immediate use. Sonographic evaluation of the left mid abdomen demonstrates an at least 8.3 x 2.1 cm hypoechoic macrolobulated mass, correlating with the mass seen on preceding abdominal CT. IMPRESSION: 1. Successful placement of a right internal jugular approach power injectable Port-A-Cath. The catheter is ready for immediate use. 2. Successful ultrasound-guided biopsy of peritoneal mass within the ventral aspect of the left mid/lateral abdomen. Electronically Signed   By: Robbi Childs M.D.   On: 05/10/2023 14:59   CT Angio Chest Aorta W and/or Wo Contrast Result Date: 05/09/2023 CLINICAL DATA:  Constipation and abdominal pain. Acute aortic syndrome suspected. EXAM: CT ANGIOGRAPHY CHEST WITH CONTRAST TECHNIQUE: Multidetector CT imaging of the chest was performed using the standard protocol during bolus administration of intravenous contrast. Multiplanar CT image reconstructions and MIPs were obtained to evaluate the vascular anatomy. RADIATION DOSE REDUCTION: This exam was performed according to the departmental dose-optimization program which includes automated exposure control, adjustment of the mA and/or kV according to patient size and/or use of iterative reconstruction technique. CONTRAST:  60mL OMNIPAQUE  IOHEXOL  350 MG/ML SOLN COMPARISON:  Radiograph 04/27/2023 and CT chest 05/15/2022 FINDINGS: Cardiovascular: Normal heart size. No pericardial effusion. Aortic atherosclerotic calcification. No aortic aneurysm, dissection, or penetrating atherosclerotic ulcer. Coronary artery calcification. Mediastinum/Nodes: Trachea and esophagus are unremarkable. No thoracic adenopathy. Lungs/Pleura: Centrilobular emphysema greatest in the apices. Bronchial wall thickening  and mucous plugging in the lower lobes. Right lower lobe atelectasis. Clustered centrilobular micro nodules and tree-in-bud opacities in the right upper lobe. No pleural effusion or pneumothorax. Upper Abdomen: No acute abnormality. Musculoskeletal: No acute fracture.  Thoracic spondylosis. Review of the MIP images confirms the above findings. IMPRESSION: 1. No aortic aneurysm or dissection. 2. Emphysema and chronic bronchitis. Aortic Atherosclerosis (ICD10-I70.0) and Emphysema (ICD10-J43.9). Electronically Signed   By: Herminia Lope  Stutzman M.D.   On: 05/09/2023 00:15   CT ABDOMEN PELVIS W CONTRAST Result Date: 05/08/2023 CLINICAL DATA:  Left lower quadrant pain EXAM: CT ABDOMEN AND PELVIS WITH CONTRAST TECHNIQUE: Multidetector CT imaging of the abdomen and pelvis was performed using the standard protocol following bolus administration of intravenous contrast. RADIATION DOSE REDUCTION: This exam was performed according to the departmental dose-optimization program which includes automated exposure control, adjustment of the mA and/or kV according to patient size and/or use of iterative reconstruction technique. CONTRAST:  50mL OMNIPAQUE  IOHEXOL  350 MG/ML SOLN COMPARISON:  CT 10/30/2015 FINDINGS: Lower chest: Lung bases demonstrate bandlike scarring within the lower lobes and right middle lobe. No acute airspace disease. Hepatobiliary: Subcentimeter hypodensities in the liver too small to further characterize. No calcified gallstone or biliary dilatation. Pancreas: Unremarkable. No pancreatic ductal dilatation or surrounding inflammatory changes. Spleen: Normal in size without focal abnormality. Adrenals/Urinary Tract: Adrenal glands show no focal abnormality. Kidneys show no hydronephrosis. Simple bilateral renal cysts for which no imaging follow-up is recommended. 2.1 cm superior pole exophytic complex cyst. Bladder partially obscured Stomach/Bowel: Stomach nonenlarged. No dilated small bowel. No acute bowel wall  thickening. Diverticular disease of the left colon. Vascular/Lymphatic: Advanced aortic atherosclerosis. No aneurysm. Mildly enlarged left Peri aortic node measuring 19 mm on series 3, image 42. Reproductive: Uterus is unremarkable. Heterogenous left adnexal mass lesion measuring 6.8 x 5.8 by 7 cm concerning for neoplasm. Other: Small volume free fluid within the pelvis. Evidence for peritoneal metastatic disease. 10 mm left upper quadrant peritoneal nodule on series 3, image 35. Right mid quadrant soft tissue mass measuring 5.7 x 2.4 cm on series 3, image 44. Left lower quadrant soft tissue mass measuring 8.1 by 2.5 by 12.1 cm. Soft tissue nodule in the right colic gutter measuring 1.7 cm on series 3, image 55. Right posterior pelvic soft tissue mass measuring 3.6 x 2.6 cm on series 3, image 76. Additional small mesenteric nodules are noted. Musculoskeletal: No acute or suspicious osseous abnormality. Left hip replacement with artifact. IMPRESSION: 1. Large heterogeneous cystic and solid left adnexal mass highly suspicious for ovarian neoplasm. 2. Positive for peritoneal metastatic disease as evidence by multiple intra-abdominal and pelvic tumor implants. Trace volume ascites in the pelvis. Several mildly enlarged retroperitoneal lymph nodes also suspicious for metastatic disease. 3. Diverticular disease of the left colon without acute inflammatory process. 4. Aortic atherosclerosis. Aortic Atherosclerosis (ICD10-I70.0). Electronically Signed   By: Esmeralda Hedge M.D.   On: 05/08/2023 19:53   DG Chest 2 View Result Date: 04/27/2023 CLINICAL DATA:  Cough.  Chills and congestion. EXAM: CHEST - 2 VIEW COMPARISON:  03/15/2020. FINDINGS: Redemonstration of irregular opacity overlying the right lateral costophrenic angle, which corresponds to area of scarring/atelectasis on the prior CT scan from 05/15/2022. Bilateral lungs are otherwise clear. No acute consolidation or lung collapse. Bilateral costophrenic angles are  clear. Normal cardio-mediastinal silhouette. No acute osseous abnormalities. The soft tissues are within normal limits. IMPRESSION: No active cardiopulmonary disease. Electronically Signed   By: Beula Brunswick M.D.   On: 04/27/2023 15:46      05/09/2023 Tumor Marker   Patient's tumor was tested for the following markers: CA-125. Results of the tumor marker test revealed 137.   05/10/2023 Pathology Results   SURGICAL PATHOLOGY  CASE: [id number]  PATIENT: Lucie Senn  Surgical Pathology Report   Clinical History: concern for metastatic ovarian cancer (cm)   FINAL MICROSCOPIC DIAGNOSIS:   A. OMENTUM, MASS WITHIN LEFT MID ABDOMEN, NEEDLE CORE BIOPSY:  -  Metastatic carcinoma with a gynecologic primary, see comment  Immunohistochemical stain show that the tumor cells are positive for CK7 and PAX8 while they are negative for CK20 and GATA3 with above interpretation.  Dr. LeGolvan reviewed the case and concurs with the above diagnosis.    05/14/2023 Cancer Staging   Staging form: Ovary, Fallopian Tube, and Primary Peritoneal Carcinoma, AJCC 8th Edition - Clinical stage from 05/14/2023: FIGO Stage IIIC (cT3c, cN1, cM0) - Signed by Almeda Jacobs, MD on 05/14/2023 Stage prefix: Initial diagnosis   05/25/2023 - 07/26/2023 Chemotherapy   Patient is on Treatment Plan : OVARIAN Carboplatin  (AUC 6) + Paclitaxel  (175) q21d X 6 Cycles     07/11/2023 Imaging   CT ABDOMEN PELVIS W CONTRAST Result Date: 07/23/2023 CLINICAL DATA:  Ovarian cancer, assess treatment response. * Tracking Code: BO * EXAM: CT ABDOMEN AND PELVIS WITH CONTRAST TECHNIQUE: Multidetector CT imaging of the abdomen and pelvis was performed using the standard protocol following bolus administration of intravenous contrast. RADIATION DOSE REDUCTION: This exam was performed according to the departmental dose-optimization program which includes automated exposure control, adjustment of the mA and/or kV according to patient size and/or use of  iterative reconstruction technique. CONTRAST:  OMNIPAQUE  IOHEXOL  300 MG/ML  SOLN COMPARISON:  Abdominopelvic CT 05/08/2023 and 10/30/2015 FINDINGS: Lower chest: Similar bandlike scarring at both lung bases. No confluent airspace disease, suspicious nodularity or pleural effusion. Aortic and coronary artery atherosclerosis noted. Hepatobiliary: The liver is normal in density without suspicious focal abnormality. Stable small low-density lesions inferiorly in the right hepatic lobe, likely cysts. No evidence of gallstones, gallbladder wall thickening or biliary dilatation. Pancreas: Unremarkable. No pancreatic ductal dilatation or surrounding inflammatory changes. Spleen: Normal in size without focal abnormality. Adrenals/Urinary Tract: Stable mild chronic enlargement of both adrenal glands, unchanged from 2017, consistent with mild hyperplasia. No evidence of urinary tract calculus, suspicious renal lesion or hydronephrosis. Stable bilateral renal cysts for which no specific follow-up imaging is recommended. The urinary bladder is largely obscured by artifact from the left total hip arthroplasty. No bladder abnormality identified. Stomach/Bowel: Enteric contrast has passed into the distal small bowel. The stomach appears unremarkable for its degree of distension. No evidence of bowel wall thickening, distention or surrounding inflammatory change. Prominent stool throughout the colon, especially within the rectum. Mild distal colonic diverticulosis. Vascular/Lymphatic: Interval resolution of previously demonstrated left periaortic adenopathy. No enlarged abdominal or pelvic lymph nodes are identified. Diffuse aortic and branch vessel atherosclerosis without evidence of aneurysm or large vessel occlusion. Reproductive: Interval decreased size of previously demonstrated left adnexal mass, now measuring 4.7 x 4.1 cm on image 61/2 (previously 6.8 x 5.8 cm). No right adnexal mass. The uterus appears unremarkable.  Other: Previously demonstrated omental nodularity has substantially improved in the interval. No significant residual measurable omental disease demonstrated. There is no significant ascites. A right perirectal mass measuring 2.5 x 1.7 cm on image 75/2 previously measured up to 3.6 cm. No progressive peritoneal disease identified. Musculoskeletal: No acute or significant osseous findings. Multilevel spondylosis. Previous left total hip arthroplasty with moderate right hip degenerative changes. IMPRESSION: 1. 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 No evidence of progressive disease. 2. Interval resolution of previously demonstrated left periaortic adenopathy. 3. No acute findings or evidence of progressive disease. 4.  Aortic Atherosclerosis (ICD10-I70.0). Electronically Signed   By: Elmon Hagedorn M.D.   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  Following dissection of adhesions, able to view the liver surface and bilateral diaphragm with evidence of true disease but no concerning areas for active disease.  Omentum with small nodularity and scarring consistent with treated disease and possibly some residual small volume disease.  Small bowel and mesentery with miliary areas of treated versus miliary ongoing disease.  Solid left adnexal mass densely adherent to the left pelvic sidewall, the rectosigmoid colon mesentery, and a loop of distal  ileum.  Dense fibrosis also encountered in the left pelvic sidewall.  Overall normal-appearing right ovary adherent to the ovarian fossa.  Apparent treated peritoneal disease to the right of the rectosigmoid colon without clear evidence of ongoing disease, biopsied.  At conclusion of case, R0 versus R1 resection pending if miliary disease (biopsied) on bowel and mesentery reflects treated versus active disease.         Interval History: Pt reports that she is recovering well from surgery. She is not having pain. She is eating and drinking well. She is voiding without issue and having regular bowel movements. No nausea or vomiting. No bleeding.   Past Medical/Surgical History: Past Medical History:  Diagnosis Date   Cancer (HCC) 03/2018   right breast cancer    ovarian   Chronic kidney disease    CKD   COPD (chronic obstructive pulmonary disease) (HCC)    DJD (degenerative joint disease)    LEFT HIP   Family history of breast cancer    Family history of ovarian cancer    Family history of prostate cancer    Family history of prostate cancer    H/O migraine 07/01/2019   Hyperlipidemia    takes Simvasatin daily   Hypertension    takes Propranolol  and HCTZ daioly   Hyperthyroidism    Pneumonia    x2   PONV (postoperative nausea and vomiting)    when ether used   Reactive airway disease 05/08/2023   Tremors of nervous system    takes Primidone  daily    Past Surgical History:  Procedure Laterality Date   BOne Spur     left heel   BOWEL RESECTION N/A 08/21/2023   Procedure: SMALL BOWEL RESECTION WITH SIDE-TO-SIDE, FUNCTIONAL END-TO-END ANASTOMOSIS;  Surgeon: Derrel Flies, MD;  Location: [facility];  Service: Gynecology;  Laterality: N/A;   BREAST LUMPECTOMY WITH RADIOACTIVE SEED AND SENTINEL LYMPH NODE BIOPSY Right 05/07/2018   Procedure: RIGHT BREAST LUMPECTOMY WITH RADIOACTIVE SEED AND RIGHT  SENTINEL LYMPH NODE MAPPING;  Surgeon: Sim Dryer, MD;  Location: [facility];  Service: General;  Laterality: Right;   COLONOSCOPY     IR IMAGING GUIDED PORT INSERTION  05/10/2023   IR US  GUIDE BX ASP/DRAIN  05/10/2023   JOINT REPLACEMENT Bilateral    knee   KNEE SURGERY Left    lateral muscle release in [employer]   OMENTECTOMY N/A 08/21/2023   Procedure: OMENTECTOMY;  Surgeon: Derrel Flies, MD;  Location: [facility];  Service: Gynecology;  Laterality: N/A;  via robot approach   RE-EXCISION OF BREAST LUMPECTOMY Right 06/05/2018   Procedure: RE-EXCISION OF RIGHT BREAST LUMPECTOMY;  Surgeon: Sim Dryer, MD;  Location: [facility];  Service: General;  Laterality: Right;   ROBOTIC ASSISTED TOTAL HYSTERECTOMY WITH BILATERAL SALPINGO OOPHERECTOMY N/A 08/21/2023   Procedure: ROBOTIC ASSISTED TOTAL LAPAROSCOPY HYSTERECTOMY WITH BILATERAL SALPINGO-OOPHORECTOMY, RADICAL DISSECTION FOR DEBULKING;  Surgeon: Derrel Flies, MD;  Location: [facility];  Service: Gynecology;  Laterality: N/A;   ROTATOR CUFF REPAIR Bilateral    TOTAL HIP ARTHROPLASTY Left 12/29/2015   Procedure: TOTAL HIP ARTHROPLASTY ANTERIOR APPROACH;  Surgeon: Ferd Householder, MD;  [facility];  Service: Orthopedics;  Laterality: Left;    Family History  Problem Relation Age of Onset   Hypertension Mother    Heart disease Mother    Stroke Mother    Multiple myeloma Father 91   Thyroid  cancer Sister        dx 38's/50's   Cancer Paternal Aunt        unknown   Prostate cancer Paternal Uncle        dx >50- cancer was the cause of his death   Ovarian cancer Paternal Aunt    Throat cancer Paternal Uncle    Breast cancer Cousin        dx >50   Cancer Cousin        type unk   Cancer Cousin        type unk   Cancer Cousin        type unk    Social History   Socioeconomic History   Marital status: Single    Spouse name: Not on file   Number of children: 1   Years of education: 14   Highest education level: Not on file  Occupational History    Comment: retired  Tobacco  Use   Smoking status: Former    Current packs/day: 0.75    Average packs/day: 0.8 packs/day for 28.0 years (21.0 ttl pk-yrs)    Types: Cigarettes    Passive exposure: Never   Smokeless tobacco: Never   Tobacco comments:    02/13/22 Pt stated "I'm trying to quit" Harding Li, CMA     Quit February 2025  Vaping Use   Vaping status: Never Used  Substance and Sexual Activity   Alcohol use: No    Alcohol/week: 0.0 standard drinks of alcohol   Drug use: No   Sexual activity: Not Currently    Birth control/protection: Post-menopausal  Other Topics Concern   Not on file  Social History Narrative   Lives with handicapped son   Caffeine- coffee, 2 cups daily, 1-2 sodas daily   Social Drivers of Health   Financial Resource Strain: Low Risk  (01/28/2020)   Overall Financial Resource Strain (CARDIA)    Difficulty of Paying Living Expenses: Not very hard  Food Insecurity: No Food Insecurity (08/21/2023)   Hunger Vital Sign    Worried About Running Out of Food in the Last Year: Never true    Ran Out of Food in the Last Year: Never true  Transportation Needs: No Transportation Needs (08/21/2023)   PRAPARE - Administrator, Civil Service (Medical): No    Lack of Transportation (Non-Medical): No  Physical Activity: Not on file  Stress: Not on file  Social Connections: Moderately Integrated (08/21/2023)   Social Connection and Isolation Panel [NHANES]    Frequency of Communication with Friends and Family: More than three times a week    Frequency of Social Gatherings with Friends and Family: Three times a week    Attends Religious Services: More than 4 times per year  Active Member of Clubs or Organizations: Yes    Attends Banker Meetings: Never    Marital Status: Divorced    Current Medications:  Current Outpatient Medications:    albuterol  (VENTOLIN  HFA) 108 (90 Base) MCG/ACT inhaler, Inhale 2 puffs into the lungs every 4 (four) hours as needed for  wheezing or shortness of breath., Disp: , Rfl:    amLODipine  (NORVASC ) 10 MG tablet, Take 1 tablet (10 mg total) by mouth daily., Disp: 90 tablet, Rfl: 1   aspirin  (ASPIRIN  ADULT) 325 MG tablet, enteric coated; may use OTC, Disp: 30 tablet, Rfl: 0   atorvastatin  (LIPITOR) 40 MG tablet, Take 1 tablet (40 mg total) by mouth daily., Disp: 90 tablet, Rfl: 1   bisacodyl  (DULCOLAX) 5 MG EC tablet, Take 2 tablets (10 mg total) by mouth daily as needed for moderate constipation., Disp: , Rfl:    dexamethasone  (DECADRON ) 4 MG tablet, Take 2 tabs at the night before and 2 tab the morning of chemotherapy, every 3 weeks, by mouth x 6 cycles, Disp: 24 tablet, Rfl: 6   levothyroxine  (SYNTHROID ) 50 MCG tablet, Take 1 tablet (50 mcg total) by mouth daily., Disp: 90 tablet, Rfl: 3   lidocaine -prilocaine  (EMLA ) cream, Apply to affected area once (Patient taking differently: Apply 1 Application topically daily as needed [facility] for chemo).), Disp: 30 g, Rfl: 3   losartan  (COZAAR ) 25 MG tablet, Take 25 mg by mouth daily., Disp: , Rfl:    Multiple Vitamin (MULTIVITAMIN WITH MINERALS) TABS tablet, Take 1 tablet by mouth daily., Disp: , Rfl:    ondansetron  (ZOFRAN ) 8 MG tablet, Take 1 tablet (8 mg total) by mouth every 8 (eight) hours as needed for nausea or vomiting. Start on the third day after carboplatin ., Disp: 30 tablet, Rfl: 1   POTASSIUM PO, Take 1 tablet by mouth daily., Disp: , Rfl:    primidone  (MYSOLINE ) 50 MG tablet, Take 2 tablets (100 mg total) by mouth every morning., Disp: 180 tablet, Rfl: 2   prochlorperazine  (COMPAZINE ) 10 MG tablet, Take 1 tablet (10 mg total) by mouth every 6 (six) hours as needed for nausea or vomiting., Disp: 30 tablet, Rfl: 1   propranolol  (INDERAL ) 40 MG tablet, Take 0.5 tablets (20 mg total) by mouth 2 (two) times daily. (Patient taking differently: Take 20 mg by mouth daily.), Disp: 90 tablet, Rfl: 1   traMADol  (ULTRAM ) 50 MG tablet, Take 1 tablet (50 mg total) by mouth every 6  (six) hours as needed for severe pain (pain score 7-10). For AFTER surgery only, do not take and drive, Disp: 10 tablet, Rfl: 0   VOLTAREN 1 % GEL, APPLY TOPICALLY 4 TIMES DAILY AS NEEDED (Patient taking differently: Apply 2 g topically daily as needed (Arthritis hip pain).), Disp: 100 g, Rfl: 0  Review of Symptoms: Complete 10-system review is positive for: None  Physical Exam: BP (!) 119/58 (BP Location: Left Arm, Patient Position: Sitting)   Pulse 60   Temp 98.1 F (36.7 C) (Oral)   Resp 16   Ht 5' 9.5" (1.765 m)   Wt 139 lb 6.4 oz (63.2 kg)   LMP 01/24/2000   SpO2 100%   BMI 20.29 kg/m  General: Alert, oriented, no acute distress. HEENT: Normocephalic, atraumatic. Neck symmetric without masses. Sclera anicteric.  Chest: Normal work of breathing. Clear to auscultation bilaterally.   Cardiovascular: Regular rate and rhythm, no murmurs. Abdomen: Soft, nontender.  Normoactive bowel sounds.  No masses appreciated.  Well-healing incisions. Extremities: Grossly normal  range of motion.  Warm, well perfused.  No edema bilaterally. Neuro: Decreased strength of right lateral foot with dorsiflexion Skin: No rashes or lesions noted. GU: Normal appearing external genitalia without erythema, excoriation, or lesions.  Speculum exam reveals intact, well healing vaginal cuff. Stitch at introitus, healing well.  Bimanual exam reveals intact vaginal cuff. Exam chaperoned by Kimberly [place], CMA   Laboratory & Radiologic Studies: Surgical pathology (08/21/23): FINAL MICROSCOPIC DIAGNOSIS:  A. UTERUS, CERVIC, BILATERAL TUBES AND OVARIES AND PORTION OF SMALL BOWEL:      Left ovary: Benign ovarian parenchyma with extensive necrosis, histiocytic inflammation and calcifications. No evidence of residual viable carcinoma. Pathologic stage (AJCC 8th): ypT0, ypNx       Benign cervix without squamous intraepithelial lesion      Benign endocervix with Nabothian cysts      Benign inactive endometrium       Benign myometrium with leiomyomata      Benign right ovary with inclusion cysts.      Segment of benign small intestine with serosal histiocytic inflammation and necrosis.  B. LEFT PELVIC SIDEWALL BIOPSY:      Benign fibrous tissue with lymphohistiocytic inflammation      Negative for malignancy  C. LEFT DISTAL PELVIC PERITOMEUM BIOPSY:      Benign fibrous tissue with lymphocytic inflammation and calcifications      Negative for malignancy  D. RIGHT PERIRECTAL PERITONEAL BIOPSY:      Metastatic carcinoma, consistent with involvement by the patients known lobular carcinoma of breast.  E. ADDITIONAL SMALL BOWEL RESECTION:      Segment of small intestine with serosal involvement by patients known lobular carcinoma of breast.      See comment  F. OMENTUM:      Benign adipose tissue with histiocytic inflammation and fibrosis      Negative for malignancy.  COMMENT: The specimen demonstrates small intestine with serosal involvement by infiltration epithelioid cells growing in cords. Immunohistochemical stains were performed to characterize the cells. The cells are positive for CK AE1/AE3, MOC31 and GATA3, and are negative for calretinin, WT-1, CK5/6, D2-40, SOX10, CDX2, and PAX8. The overall features are in keeping with involvement by the patients know lobular carcinoma of breast.  Controls worked appropriately.    ADDENDUM:  Small bowel resection (D)   Metastatic carcinoma   PROGNOSTIC INDICATORS   Results:  IMMUNOHISTOCHEMICAL AND MORPHOMETRIC ANALYSIS PERFORMED MANUALLY   The tumor cells are negative for Her2 (0).   Estrogen Receptor:  100%, positive, strong staining intensity  Progesterone Receptor:  100%, positive, strong staining intensity   COMMENT:  The negative hormone receptor study(ies) in this case has an  internal positive control.

## 2023-09-17 NOTE — Progress Notes (Signed)
 [facility]  [phone] [phone]     ID: Catherine Lowery DOB: 1945/12/24  MR#: [id number]  MWU#:[id number]  Patient Care Team: Hershell Lose, NP as PCP - General (Nurse Practitioner) Hazle Lites, MD as PCP - Cardiology (Cardiology) Retta Caster, MD as Consulting Physician (Radiation Oncology) Saundra Curl, MD as Attending Physician (Orthopedic Surgery) Omega Bible, MD as Consulting Physician (Neurology) Myrle Aspen, [facility] (Inactive) as Pharmacist (Pharmacist) OTHER MD:   CHIEF COMPLAINT: Metastatic breast cancer  CURRENT TREATMENT: None   INTERVAL HISTORY: Discussed the use of AI scribe software for clinical note transcription with the patient, who gave verbal consent to proceed.  History of Present Illness Catherine Lowery is a 78 year old female with metastatic lobular breast cancer who presents for follow-up and management of her condition.  She has a history of ovarian cancer, which was successfully treated. During surgery, lobular breast cancer was unexpectedly discovered in the peritoneum on final path. She was previously on anastrozole  but discontinued it when chemotherapy was initiated. Currently, she is not on any cancer treatment medications.  She reports a new issue with her right knee, which started about a month ago following surgery. The knee feels numb and drags, leading to difficulty walking and a risk of falling. The numbness is localized around the ankle, and she has not experienced any pain in the knee.  Her caregiver notes that she has not been physically active since staying at her house, primarily sitting and watching TV. She has used a walker occasionally but has not been consistent with physical activity. She has a port in place, which is currently used for blood draws. She has not driven since her surgery, initially due to pain medications, but she is no longer on them.  She feels cold all the  time, but it is unclear if this is related to a vitamin deficiency or thyroid  issue. No pain in her right knee but reports numbness and dragging.   COVID 19 VACCINATION STATUS: Pfizer x4, last 10/2020   Breast cancer History  Catherine Lowery had routine screening mammography on 02/26/2018 showing a possible abnormality in the right breast. She underwent unilateral right diagnostic mammography with tomography and right breast ultrasonography at [facility] on 03/07/2018 showing: Breast Density Category B. On mammography, there is a new 1.5 cm irregular architectural distortion with a spiculated margin in the right breast at 5 o'clock anterior depth. On ultrasound, there is a 2.7 cm irregular solid mass in the right breast at 5 o'clock middle depth. This irregular mass displays posterior acoustic shadowing. No significant abnormalities were seen sonographically in the right axilla.  Accordingly on 03/18/2018 she proceeded to biopsy of the right breast area in question. The pathology from this procedure showed ([id number]): Invasive mammary carcinoma e-cadherin negative, grade II-III. 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 Prognostic indicators significant for: estrogen receptor, 95% positive and progesterone receptor, 95% positive, both with strong staining intensity. Proliferation marker Ki67 at 5%. HER2 negative by immunohistochemistry (1+).  On 04/10/2018 the patient underwent bilateral breast MRI with and without contrast showing a 2.3 cm spiculated enhancing mass associated with non-masslike enhancement, the total abnormal area measuring 4.6 cm.  There was no evidence of lymph node abnormality and the contralateral breast was benign appearing  The patient's subsequent history is as detailed below.   PAST MEDICAL HISTORY: Past Medical History:  Diagnosis Date   Cancer (HCC) 03/2018   right breast cancer  ovarian   Chronic kidney disease    CKD   COPD (chronic  obstructive pulmonary disease) (HCC)    DJD (degenerative joint disease)    LEFT HIP   Family history of breast cancer    Family history of ovarian cancer    Family history of prostate cancer    Family history of prostate cancer    H/O migraine 07/01/2019   Hyperlipidemia    takes Simvasatin daily   Hypertension    takes Propranolol  and HCTZ daioly   Hyperthyroidism    Pneumonia    x2   PONV (postoperative nausea and vomiting)    when ether used   Reactive airway disease 05/08/2023   Tremors of nervous system    takes Primidone  daily    PAST SURGICAL HISTORY: Past Surgical History:  Procedure Laterality Date   BOne Spur     left heel   BOWEL RESECTION N/A 08/21/2023   Procedure: SMALL BOWEL RESECTION WITH SIDE-TO-SIDE, FUNCTIONAL END-TO-END ANASTOMOSIS;  Surgeon: Derrel Flies, MD;  Location: [facility];  Service: Gynecology;  Laterality: N/A;   BREAST LUMPECTOMY WITH RADIOACTIVE SEED AND SENTINEL LYMPH NODE BIOPSY Right 05/07/2018   Procedure: RIGHT BREAST LUMPECTOMY WITH RADIOACTIVE SEED AND RIGHT  SENTINEL LYMPH NODE MAPPING;  Surgeon: Sim Dryer, MD;  Location: [facility];  Service: General;  Laterality: Right;   COLONOSCOPY     IR IMAGING GUIDED PORT INSERTION  05/10/2023   IR US  GUIDE BX ASP/DRAIN  05/10/2023   JOINT REPLACEMENT Bilateral    knee   KNEE SURGERY Left    lateral muscle release in [employer]   OMENTECTOMY N/A 08/21/2023   Procedure: OMENTECTOMY;  Surgeon: Derrel Flies, MD;  Location: [facility];  Service: Gynecology;  Laterality: N/A;  via robot approach   RE-EXCISION OF BREAST LUMPECTOMY Right 06/05/2018   Procedure: RE-EXCISION OF RIGHT BREAST LUMPECTOMY;  Surgeon: Sim Dryer, MD;  Location: [facility];  Service: General;  Laterality: Right;   ROBOTIC ASSISTED TOTAL HYSTERECTOMY WITH BILATERAL SALPINGO OOPHERECTOMY N/A 08/21/2023   Procedure: ROBOTIC ASSISTED TOTAL LAPAROSCOPY HYSTERECTOMY WITH BILATERAL  SALPINGO-OOPHORECTOMY, RADICAL DISSECTION FOR DEBULKING;  Surgeon: Derrel Flies, MD;  Location: [facility];  Service: Gynecology;  Laterality: N/A;   ROTATOR CUFF REPAIR Bilateral    TOTAL HIP ARTHROPLASTY Left 12/29/2015   Procedure: TOTAL HIP ARTHROPLASTY ANTERIOR APPROACH;  Surgeon: Ferd Householder, MD;  Location: [facility];  Service: Orthopedics;  Laterality: Left;    FAMILY HISTORY Family History  Problem Relation Age of Onset   Hypertension Mother    Heart disease Mother    Stroke Mother    Multiple myeloma Father 20   Thyroid  cancer Sister        dx 40's/50's   Cancer Paternal Aunt        unknown   Prostate cancer Paternal Uncle        dx >50- cancer was the cause of his death   Ovarian cancer Paternal Aunt    Throat cancer Paternal Uncle    Breast cancer Cousin        dx >50   Cancer Cousin        type unk   Cancer Cousin        type unk   Cancer Cousin        type unk  She notes that her father died from a multiple myeloma at age 56. Patients' mother died from stroke at age 86. The patient has 1 brother and 2 sisters.  One paternal aunt had ovarian cancer.  One cousin had breast cancer, and her 51s. Another paternal aunt had cancer, type unknown to the patient, and a paternal uncle had prostate cancer.    GYNECOLOGIC HISTORY:  Menarche: 78 years old Age at first live birth: 78 years old GX P: 1 LMP: Patient's last menstrual period was 01/24/2000. 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 Before that, she has been employed at [employer], [employer], and as an elderly caregiver. 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 She is separated, and has not spoken to her husband, Callie Cater, in over 10 years. She lives with her only son, Sriya Kroeze, who is disabled secondary to  SCD and multiple surgeries. She has no pets. She attends [employer].    ADVANCED DIRECTIVES: Her sister, Queenie Brunet, is Catherine Lowery's medical power of attorney. Garey Jung can be reached at [phone]. Guyette has stated that her husband, Callie Cater, is not to have access to her medical records or to make decisions on her behalf. ]   HEALTH MAINTENANCE: Social History   Tobacco Use   Smoking status: Former    Current packs/day: 0.75    Average packs/day: 0.8 packs/day for 28.0 years (21.0 ttl pk-yrs)    Types: Cigarettes    Passive exposure: Never   Smokeless tobacco: Never   Tobacco comments:    02/13/22 Pt stated "I'm trying to quit" Harding Li, CMA     Quit February 2025  Vaping Use   Vaping status: Never Used  Substance Use Topics   Alcohol use: No    Alcohol/week: 0.0 standard drinks of alcohol   Drug use: No     Colonoscopy: 01/2015  PAP:   Bone density:  02/26/2018: T-score 1.4   Allergies  Allergen Reactions   Morphine And Codeine Itching and Nausea And Vomiting   Percocet [Oxycodone -Acetaminophen ] Itching and Nausea And Vomiting    Current Outpatient Medications  Medication Sig Dispense Refill   abemaciclib (VERZENIO) 100 MG tablet Take 1 tablet (100 mg total) by mouth 2 (two) times daily. 56 tablet 2   letrozole (FEMARA) 2.5 MG tablet Take 1 tablet (2.5 mg total) by mouth daily. 90 tablet 3   albuterol  (VENTOLIN  HFA) 108 (90 Base) MCG/ACT inhaler Inhale 2 puffs into the lungs every 4 (four) hours as needed for wheezing or shortness of breath.     amLODipine  (NORVASC ) 10 MG tablet Take 1 tablet (10 mg total) by mouth daily. 90 tablet 1   aspirin  (ASPIRIN  ADULT) 325 MG tablet enteric coated; may use OTC 30 tablet 0   atorvastatin  (LIPITOR) 40 MG tablet Take 1 tablet (40 mg total) by mouth daily. 90 tablet 1   bisacodyl  (DULCOLAX) 5 MG EC tablet Take 2 tablets (10 mg total) by mouth daily as needed for moderate constipation.     dexamethasone   (DECADRON ) 4 MG tablet Take 2 tabs at the night before and 2 tab the morning of chemotherapy, every 3 weeks, by mouth x 6 cycles 24 tablet 6   levothyroxine  (SYNTHROID ) 50 MCG tablet Take 1 tablet (50 mcg total) by mouth daily. 90 tablet 3   lidocaine -prilocaine  (EMLA ) cream Apply to affected area once (Patient taking differently: Apply 1 Application topically daily as needed [facility] for chemo).) 30  g 3   losartan  (COZAAR ) 25 MG tablet Take 25 mg by mouth daily.     Multiple Vitamin (MULTIVITAMIN WITH MINERALS) TABS tablet Take 1 tablet by mouth daily.     ondansetron  (ZOFRAN ) 8 MG tablet Take 1 tablet (8 mg total) by mouth every 8 (eight) hours as needed for nausea or vomiting. Start on the third day after carboplatin . 30 tablet 1   POTASSIUM PO Take 1 tablet by mouth daily.     primidone  (MYSOLINE ) 50 MG tablet Take 2 tablets (100 mg total) by mouth every morning. 180 tablet 2   prochlorperazine  (COMPAZINE ) 10 MG tablet Take 1 tablet (10 mg total) by mouth every 6 (six) hours as needed for nausea or vomiting. 30 tablet 1   propranolol  (INDERAL ) 40 MG tablet Take 0.5 tablets (20 mg total) by mouth 2 (two) times daily. (Patient taking differently: Take 20 mg by mouth daily.) 90 tablet 1   traMADol  (ULTRAM ) 50 MG tablet Take 1 tablet (50 mg total) by mouth every 6 (six) hours as needed for severe pain (pain score 7-10). For AFTER surgery only, do not take and drive 10 tablet 0   VOLTAREN 1 % GEL APPLY TOPICALLY 4 TIMES DAILY AS NEEDED (Patient taking differently: Apply 2 g topically daily as needed (Arthritis hip pain).) 100 g 0   No current facility-administered medications for this visit.    OBJECTIVE: African-American woman in no acute distress  Vitals:   09/18/23 0845  BP: (!) 133/50  Pulse: 82  Resp: 17  Temp: 98.5 F (36.9 C)  SpO2: 99%    Wt Readings from Last 3 Encounters:  09/18/23 137 lb 9.6 oz (62.4 kg)  09/10/23 139 lb 6.4 oz (63.2 kg)  09/03/23 134 lb 11.2 oz (61.1 kg)    Body mass index is 20.03 kg/m.    ECOG FS:1 - Symptomatic but completely ambulatory   Physical Exam Constitutional:      Appearance: Normal appearance.  Cardiovascular:     Rate and Rhythm: Normal rate and regular rhythm.  Pulmonary:     Effort: Pulmonary effort is normal.     Breath sounds: Normal breath sounds.  Abdominal:     General: Abdomen is flat.     Palpations: Abdomen is soft.  Musculoskeletal:        General: No swelling or tenderness.     Cervical back: Normal range of motion. No rigidity.  Lymphadenopathy:     Cervical: No cervical adenopathy.  Skin:    General: Skin is warm and dry.  Neurological:     General: No focal deficit present.     Mental Status: She is alert and oriented to person, place, and time.     Motor: No weakness (NO demonstrated weakness of the right leg but gait deformity noted).  Psychiatric:        Mood and Affect: Mood normal.       LAB RESULTS:  CMP     Component Value Date/Time   NA 139 09/18/2023 0939   NA 139 11/22/2020 0000   K 3.4 (L) 09/18/2023 0939   CL 107 09/18/2023 0939   CO2 26 09/18/2023 0939   GLUCOSE 127 (H) 09/18/2023 0939   BUN 17 09/18/2023 0939   CREATININE 0.78 09/18/2023 0939   CREATININE 1.05 (H) 05/19/2019 1034   CALCIUM  8.9 09/18/2023 0939   PROT 7.4 09/18/2023 0939   ALBUMIN  3.6 09/18/2023 0939   AST 21 09/18/2023 0939   ALT 16 09/18/2023 0939  ALKPHOS 65 09/18/2023 0939   BILITOT 0.3 09/18/2023 0939   GFRNONAA >60 09/18/2023 0939   GFRNONAA 48 (L) 01/23/2018 0804   GFRAA >60 06/23/2019 1524   GFRAA 56 (L) 01/23/2018 0804    No results found for: "TOTALPROTELP", "ALBUMINELP", "A1GS", "A2GS", "BETS", "BETA2SER", "GAMS", "MSPIKE", "SPEI"  No results found for: "KPAFRELGTCHN", "LAMBDASER", "KAPLAMBRATIO"  Lab Results  Component Value Date   WBC 5.2 09/18/2023   NEUTROABS 4.0 09/18/2023   HGB 8.3 (L) 09/18/2023   HCT 25.4 (L) 09/18/2023   MCV 73.8 (L) 09/18/2023   PLT 176 09/18/2023    No results found for: "LABCA2"  No components found for: "VWUJWJ191"  No results for input(s): "INR" in the last 168 hours.  No results found for: "LABCA2"  Lab Results  Component Value Date   YNW295 65 (H) 07/26/2023    Lab Results  Component Value Date   AOZ308 14.2 07/26/2023    No results found for: "MVH846"  Lab Results  Component Value Date   CA2729 44.1 (H) 05/09/2023    No components found for: "HGQUANT"  Lab Results  Component Value Date   CEA1 3.4 05/09/2023   /  CEA  Date Value Ref Range Status  05/09/2023 3.4 0.0 - 4.7 ng/mL Final    Comment:    (NOTE)                             Nonsmokers          <3.9                             Smokers             <5.6 Roche [employer] Electrochemiluminescence Immunoassay (ECLIA) Values obtained with different assay methods or kits cannot be used interchangeably.  Results cannot be interpreted as absolute evidence of the presence or absence of malignant disease. Performed At: [facility] [address] [place], [name] [id number] Pearlean Botts MD LK:[id number]      No results found for: "AFPTUMOR"  No results found for: "CHROMOGRNA"  Lab Results  Component Value Date   HGBA 70.8 (L) 06/23/2019   HGBA2QUANT 3.6 (H) 06/23/2019   HGBFQUANT 0.0 06/23/2019   HGBSQUAN 25.6 (H) 06/23/2019   (Hemoglobinopathy evaluation)   Lab Results  Component Value Date   LDH 147 06/23/2019    Lab Results  Component Value Date   IRON 59 05/09/2023   TIBC 228 (L) 05/09/2023   IRONPCTSAT 26 05/09/2023   (Iron and TIBC)  Lab Results  Component Value Date   FERRITIN 263 05/09/2023    Urinalysis    Component Value Date/Time   COLORURINE YELLOW 05/09/2023 0225   APPEARANCEUR CLEAR 05/09/2023 0225   LABSPEC >1.046 (H) 05/09/2023 0225   PHURINE 5.0 05/09/2023 0225   GLUCOSEU NEGATIVE 05/09/2023 0225   HGBUR SMALL (A) 05/09/2023 0225   BILIRUBINUR NEGATIVE 05/09/2023 0225   KETONESUR NEGATIVE  05/09/2023 0225   PROTEINUR 100 (A) 05/09/2023 0225   UROBILINOGEN 0.2 03/24/2011 1054   NITRITE NEGATIVE 05/09/2023 0225   LEUKOCYTESUR NEGATIVE 05/09/2023 0225    STUDIES:  CT ABDOMEN PELVIS W CONTRAST Result Date: 08/26/2023 CLINICAL DATA:  Ovarian cancer, bowel obstruction, recent hysterectomy 08/21/2023 EXAM: CT ABDOMEN AND PELVIS WITH CONTRAST TECHNIQUE: Multidetector CT imaging of the abdomen and pelvis was performed using the standard protocol following bolus administration of intravenous contrast. RADIATION DOSE REDUCTION:  This exam was performed according to the departmental dose-optimization program which includes automated exposure control, adjustment of the mA and/or kV according to patient size and/or use of iterative reconstruction technique. CONTRAST:  OMNIPAQUE  IOHEXOL  300 MG/ML  SOLN COMPARISON:  08/25/2023, 07/11/2023 FINDINGS: Lower chest: Trace bilateral effusions with minimal dependent lower lobe atelectasis. Hepatobiliary: Distended gallbladder without evidence of cholelithiasis or cholecystitis. Stable subcentimeter hepatic hypodensities most consistent with cysts. No biliary duct dilation. Pancreas: Unremarkable. No pancreatic ductal dilatation or surrounding inflammatory changes. Spleen: Normal in size without focal abnormality. Adrenals/Urinary Tract: The adrenals are stable. Numerous bilateral renal cortical cysts are again noted, and do not require specific imaging follow-up. No urinary tract calculi or obstructive uropathy within either kidney. Bladder is only minimally distended, limiting its evaluation. Stomach/Bowel: Segmental dilatation of the mid jejunum measuring up to 4 cm in diameter, with scattered gas fluid levels, compatible with developing small bowel obstruction. The proximal jejunum and ileum demonstrate relative normal caliber. Continued radiographic follow-up is recommended. Evidence of small bowel resection and reanastomosis in the right lower quadrant.  Diverticulosis of the distal colon without evidence of acute diverticulitis. Enteric catheter identified, tip within the lumen of the gastric fundus. Vascular/Lymphatic: Aortic atherosclerosis. No enlarged abdominal or pelvic lymph nodes. Reproductive: Interval hysterectomy and bilateral oophorectomy. 6.2 x 3.3 cm area of gas and fluid at the surgical bed may reflect Surgicel and postoperative seroma/hematoma. Continued follow-up is recommended to exclude developing abscess. Other: Free fluid throughout the abdomen and pelvis. There is some higher attenuation layering dependently within the pelvic free fluid, which could reflect residual blood products given recent surgical intervention. The right perirectal nodule seen on prior exam is again noted reference image 79/2, measuring 2.1 x 1.5 cm. Evaluation is limited due to streak artifact and motion at this location. Punctate foci of free intraperitoneal gas consistent with recent surgical intervention. Extensive subcutaneous gas throughout the anterior abdominal wall extending into the inguinal regions compatible with recent laparoscopic procedure. No subcutaneous fluid collections or abscess. Musculoskeletal: No acute or destructive bony abnormalities. Unremarkable left hip arthroplasty. Moderate right hip osteoarthritis. Reconstructed images demonstrate no additional findings. IMPRESSION: 1. Interval laparoscopic hysterectomy and bilateral oophorectomy, with collection of gas and fluid at the surgical bed likely representing postoperative seroma/hematoma. Continued follow-up recommended to exclude developing abscess. Residual pneumoperitoneum and extensive abdominal wall gas consistent with recent laparoscopic procedure. 2. Findings consistent with developing small bowel obstruction, with segmental dilatation of the mid jejunum measuring up to 4 cm. Continued radiographic follow-up recommended. 3. Small volume ascites throughout the abdomen and pelvis, with high  attenuation fluid layering dependently in the pelvis likely representing residual blood products after surgery. 4. Stable right perirectal soft tissue nodule. 5. Trace bilateral pleural effusions and minimal dependent lower lobe atelectasis. 6.  Aortic Atherosclerosis (ICD10-I70.0). Electronically Signed   By: Bobbye Burrow M.D.   On: 08/26/2023 17:13   DG Abd 1 View Result Date: 08/25/2023 CLINICAL DATA:  Check gastric catheter placement EXAM: ABDOMEN - 1 VIEW COMPARISON:  None Available. FINDINGS: Gastric catheter is noted within the stomach. The proximal side port lies at the gastroesophageal junction. This could be advanced deeper into the stomach. Mild small bowel dilatation is seen. No free air is noted. IMPRESSION: Gastric catheter as described. This should be advanced deeper into the stomach. Electronically Signed   By: Violeta Grey M.D.   On: 08/25/2023 14:34     ELIGIBLE FOR AVAILABLE RESEARCH PROTOCOL: no   ASSESSMENT: 78 y.o. [name], [name]  woman status post right breast lower inner quadrant biopsy 03/18/2018 for a clinical T2N0, [name], grade 2 or 3, estrogen and progesterone receptor positive, [name], with an MIB-1 of 5%  (1) Genetic testing performed through [facility] Common Hereditary Cancers Panel + Thyroid  Cancer Panel on 05/09/2018 showing no deleterious mutations APC, [name], [name], [name], [name], [name], [name], [name], [name], [name], [name], [name], [name], [name], [name], [name], [name], [name], [name], [name], [name], KIT, MEN1, [name], [name], [name], [name], [name], [name], [name], [name], [name], [name], [name], [name], [name], POLE, [name], [name], RAD50, [name], [name], [name], [name], [name], [name], [name], [name], [name], [name], [name], [name], [name], [name], [name], VHL.   (a) Two variants of uncertain significance in the genes [name] c.1176C>T (Silent) and [name] c.1419+4C>T (Intronic) were identified.  (2) right lumpectomy and sentinel lymph node  sampling 05/07/2018 showed a pT2 pN1, [name], grade 2, with a positive inferior margin  (a) reexcision scheduled for 06/05/2018  (3) MammaPrint obtained from the definitive surgical sample read as "low risk" predicting a disease-free survival at 5 years of 97.8% without need of chemotherapy.  (4) adjuvant radiation 07/11/2018 - 08/27/2018  (a) 1. Right breast; [name]; [name]; 6 fractions of 2 Gy for a total of 12 Gy  (5) anastrozole  started 10/02/2018  (a) bone density 05/05/2021, [name], normal imits       (6) genetic testing 05/18/2018 through the Common Hereditary Cancers Panel + Thyroid  Caner panel showed no deleterious mutations in APC, [name], [name], [name], [name], [name], [name], [name], [name], [name], [name], [name], [name], [name], [name], [name], [name], [name], [name], [name], [name], KIT, MEN1, [name], [name], [name], [name], [name], [name], [name], [name], [name], [name], [name], [name], [name], POLE, [name], [name], RAD50, [name], [name], [name], [name], [name], [name], [name], [name], [name], [name], [name], [name], [name], [name], [name], VHL.  (a) 2 variants of uncertain significance in the genes [name] c.1176C>T (Silent) and [name] c.1419+4C>T (Intronic) were identified.    (b) UPDATE: The [name] c.1419+4C>T (Intronic) VUS has been reclassified to "Likely Benign." The report date is 08/08/2018.  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 Treatment with Verzenio and letrozole planned for disease control. Explained treatment intent and options. - Prescribe letrozole, to be picked up at [facility]. - Prescribe Verzenio (abemaciclib), to be shipped to home. - Send surgical pathology to [facility] tissue from most recent surgery. - Schedule follow-up in 4 weeks to assess treatment response. - Schedule pharmacy visit for medication counseling. - Order tumor marker tests to assess breast cancer activity.  Right leg numbness and weakness New onset post-surgery, ? possible metastatic involvement of the spine. - Order MRI spine to assess for metastatic involvement. - Order CT chest without contrast to evaluate for metastasis in the lungs to complete staging. - Encourage ambulation with a walker in the interim.  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 Complete pathologic response on most recent surgery. After discussion in TB, we have agreed to treat breast cancer and continue surveillance for ovarian cancer  General Health Maintenance  - Encourage use of a walker. - Check thyroid  function tests. Sister requests this because she is feeling cold all the time. - ok to take multivitamins per pt's desire    Total time spent: 40 minutes.  *Total Encounter Time as defined by the Centers for Medicare and Medicaid Services includes, in addition to the face-to-face time of a patient visit (documented in the note above) non-face-to-face time: obtaining and reviewing outside history, ordering and reviewing medications, tests or procedures, care coordination (communications with other health care professionals or caregivers) and documentation in the medical record.

## 2023-09-18 ENCOUNTER — Inpatient Hospital Stay

## 2023-09-18 ENCOUNTER — Encounter: Payer: Self-pay | Admitting: Hematology and Oncology

## 2023-09-18 ENCOUNTER — Inpatient Hospital Stay (HOSPITAL_BASED_OUTPATIENT_CLINIC_OR_DEPARTMENT_OTHER): Admitting: Hematology and Oncology

## 2023-09-18 VITALS — BP 133/50 | HR 82 | Temp 98.5°F | Resp 17 | Wt 137.6 lb

## 2023-09-18 DIAGNOSIS — Z79811 Long term (current) use of aromatase inhibitors: Secondary | ICD-10-CM

## 2023-09-18 DIAGNOSIS — D61818 Other pancytopenia: Secondary | ICD-10-CM

## 2023-09-18 DIAGNOSIS — Z170 Estrogen receptor positive status [ER+]: Secondary | ICD-10-CM

## 2023-09-18 DIAGNOSIS — C562 Malignant neoplasm of left ovary: Secondary | ICD-10-CM | POA: Diagnosis not present

## 2023-09-18 DIAGNOSIS — C50311 Malignant neoplasm of lower-inner quadrant of right female breast: Secondary | ICD-10-CM

## 2023-09-18 LAB — CMP (CANCER CENTER ONLY)
ALT: 16 U/L (ref 0–44)
AST: 21 U/L (ref 15–41)
Albumin: 3.6 g/dL (ref 3.5–5.0)
Alkaline Phosphatase: 65 U/L (ref 38–126)
Anion gap: 6 (ref 5–15)
BUN: 17 mg/dL (ref 8–23)
CO2: 26 mmol/L (ref 22–32)
Calcium: 8.9 mg/dL (ref 8.9–10.3)
Chloride: 107 mmol/L (ref 98–111)
Creatinine: 0.78 mg/dL (ref 0.44–1.00)
GFR, Estimated: >60 mL/min (ref >60)
Glucose, Bld: 127 mg/dL — ABNORMAL HIGH (ref 70–99)
Potassium: 3.4 mmol/L — ABNORMAL LOW (ref 3.5–5.1)
Sodium: 139 mmol/L (ref 135–145)
Total Bilirubin: 0.3 mg/dL (ref 0.0–1.2)
Total Protein: 7.4 g/dL (ref 6.5–8.1)

## 2023-09-18 LAB — CBC WITH DIFFERENTIAL/PLATELET
Abs Immature Granulocytes: 0.02 10*3/uL (ref 0.00–0.07)
Basophils Absolute: 0 10*3/uL (ref 0.0–0.1)
Basophils Relative: 0 %
Eosinophils Absolute: 0.2 10*3/uL (ref 0.0–0.5)
Eosinophils Relative: 4 %
HCT: 25.4 % — ABNORMAL LOW (ref 36.0–46.0)
Hemoglobin: 8.3 g/dL — ABNORMAL LOW (ref 12.0–15.0)
Immature Granulocytes: 0 %
Lymphocytes Relative: 8 %
Lymphs Abs: 0.4 10*3/uL — ABNORMAL LOW (ref 0.7–4.0)
MCH: 24.1 pg — ABNORMAL LOW (ref 26.0–34.0)
MCHC: 32.7 g/dL (ref 30.0–36.0)
MCV: 73.8 fL — ABNORMAL LOW (ref 80.0–100.0)
Monocytes Absolute: 0.5 10*3/uL (ref 0.1–1.0)
Monocytes Relative: 10 %
Neutro Abs: 4 10*3/uL (ref 1.7–7.7)
Neutrophils Relative %: 78 %
Platelets: 176 10*3/uL (ref 150–400)
RBC: 3.44 MIL/uL — ABNORMAL LOW (ref 3.87–5.11)
RDW: 15.3 % (ref 11.5–15.5)
WBC: 5.2 10*3/uL (ref 4.0–10.5)
nRBC: 0 % (ref 0.0–0.2)

## 2023-09-18 LAB — SAMPLE TO BLOOD BANK

## 2023-09-18 LAB — TSH: TSH: 0.436 u[IU]/mL (ref 0.350–4.500)

## 2023-09-18 MED ORDER — SODIUM CHLORIDE 0.9% FLUSH
10.0000 mL | Freq: Once | INTRAVENOUS | Status: AC
  Administered 2023-09-18: 10 mL

## 2023-09-18 MED ORDER — HEPARIN SOD (PORK) LOCK FLUSH 100 UNIT/ML IV SOLN
500.0000 [IU] | Freq: Once | INTRAVENOUS | Status: AC
  Administered 2023-09-18: 500 [IU]

## 2023-09-18 MED ORDER — LETROZOLE 2.5 MG PO TABS: 2.5000 mg | ORAL_TABLET | Freq: Every day | ORAL | 3 refills | Status: DC

## 2023-09-18 MED ORDER — ABEMACICLIB 100 MG PO TABS
100.0000 mg | ORAL_TABLET | Freq: Two times a day (BID) | ORAL | 2 refills | Status: DC
  Filled 2023-09-20: qty 56, 28d supply, fill #0
  Filled 2023-10-17: qty 56, 28d supply, fill #1
  Filled 2023-11-12: qty 56, 28d supply, fill #2

## 2023-09-18 NOTE — Addendum Note (Signed)
 Addended by: Kevaughn Ewing, NAGA VENKATA KALIPRAVEENA on: 09/18/2023 01:00 PM   Modules accepted: Level of Service

## 2023-09-19 ENCOUNTER — Encounter: Payer: Self-pay | Admitting: Hematology and Oncology

## 2023-09-19 ENCOUNTER — Other Ambulatory Visit (HOSPITAL_COMMUNITY): Payer: Self-pay

## 2023-09-19 ENCOUNTER — Telehealth: Payer: Self-pay | Admitting: *Deleted

## 2023-09-19 ENCOUNTER — Telehealth: Payer: Self-pay

## 2023-09-19 LAB — CANCER ANTIGEN 27.29: CA 27.29: 34.9 U/mL (ref 0.0–38.6)

## 2023-09-19 NOTE — Telephone Encounter (Signed)
 This RN spoke with the pt's sister and care giver - Catherine Lowery. She states Catherine Lowery forgot to mention at visit yesterday abd discomfort.  Catherine Lowery is concerned if cancer could be present and if further scans in addition to what is scheduled should be done.  This RN reviewed CT of abd and pelvis with noted post surgical trauma- as well as mild ascites.   Discussed above with inquiry of bowel movements with Catherine Lowery stating pt is having BM's.  She denies any nausea.  Pt is not taking any pain medication.  Per discussion plan is for pt to use Gas X and tylenol - if symptoms not well controlled to call for further recommendations.  Reviewed also signs and symptoms of ascites-and to call if occurs.  Catherine Lowery stated appreciation of discussion and plan - no further needs at this time.  Above reviewed with MD.

## 2023-09-19 NOTE — Telephone Encounter (Addendum)
 Oral Oncology Patient Advocate Encounter  Prior Authorization for Verzenio has been approved.    PA# [id number]  Effective dates: 09/19/23 through 03/17/24  Patients co-pay is $1,717.78.   [employer] obtained to make co-pay $0.00.   Hansel Ley, CPhT Pharmacy Technician Coordinator [facility] [phone] (Ph) 09/19/2023 10:54 AM

## 2023-09-19 NOTE — Telephone Encounter (Signed)
 Oral Oncology Patient Advocate Encounter  Was successful in securing patient a $7,500.00 grant from [employer] to provide copayment coverage for [employer].  This will keep the out of pocket expense at $0.     [employer] ID: [id number]   The billing information is as follows and has been shared with [facility].    RxBin: [id number] PCN: [id number] Member ID: [id number] Group ID: [id number] Dates of Eligibility: 08/20/23 through 08/18/24  Fund:  Breast Cancer - Medicare Access   Hansel Ley, CPhT Pharmacy Technician Coordinator [facility] [phone] (Ph) 09/19/2023 11:30 AM

## 2023-09-19 NOTE — Telephone Encounter (Signed)
 Oral Oncology Patient Advocate Encounter  New authorization   Received notification that prior authorization for Verzenio is required.   PA submitted on 09/19/23  Key [id number]  Status is pending     Hansel Ley, CPhT Pharmacy Technician Coordinator [facility] [phone] (Ph) 09/19/2023 10:02 AM

## 2023-09-20 ENCOUNTER — Ambulatory Visit: Admitting: Hematology and Oncology

## 2023-09-20 ENCOUNTER — Other Ambulatory Visit

## 2023-09-20 ENCOUNTER — Ambulatory Visit

## 2023-09-20 ENCOUNTER — Encounter: Admitting: Dietician

## 2023-09-20 ENCOUNTER — Telehealth: Payer: Self-pay

## 2023-09-20 ENCOUNTER — Other Ambulatory Visit: Payer: Self-pay

## 2023-09-20 ENCOUNTER — Other Ambulatory Visit (HOSPITAL_COMMUNITY): Payer: Self-pay

## 2023-09-20 NOTE — Telephone Encounter (Signed)
 Patient successfully OnBoarded and drug education scheduled to be provided by pharmacist at appointment on Wednesday, 09/26/23. Medication scheduled to be shipped on Tuesday, 09/25/23, for delivery on Wednesday, 09/26/23, from [facility] to patient's address. Patient also knows to call me at [phone] with any questions or concerns regarding receiving medication or if there is any unexpected change in co-pay.    Hansel Ley, CPhT Pharmacy Technician Coordinator [facility] [phone] (Ph) 09/20/2023 10:11 AM

## 2023-09-20 NOTE — Progress Notes (Unsigned)
 Specialty Pharmacy Initial Fill Coordination Note  Catherine Lowery is a 78 y.o. female contacted today regarding initial fill of specialty medication(s) Abemaciclib (VERZENIO)  Patient requested Delivery   Delivery date: 09/26/23   Verified address: [address] [place], [name] [address]  Medication will be filled on 09/25/23.   Patient is aware of $0.00 copayment. [employer] secondary.   Hansel Ley, CPhT Pharmacy Technician Coordinator [facility] [phone] (Ph) 09/20/2023 10:08 AM

## 2023-09-20 NOTE — Telephone Encounter (Signed)
 I reached out to [facility] to check status of recent referral from Dr.Newton regarding right foot drop (see last office note).   Catherine Lowery states their office has reached out to Catherine Lowery leaving a Oceanographer. No response from pt. Referral has been closed on their end until pt reaches out to them.   I called pt's sister, Catherine Lowery, to let her know the rehab office was trying to reach patient for an appointment. She states Catherine Lowery and the family has decided to wait to see rehab until after the MRI on 5/30, ordered by Dr.Iruku on 5/20.   She wanted me to pass on to Dr.Newton how grateful they are in the care she has given to Catherine Lowery.

## 2023-09-21 ENCOUNTER — Encounter: Payer: Self-pay | Admitting: Hematology and Oncology

## 2023-09-21 NOTE — Research (Addendum)
 LATE ENTRY  Effectiveness of Out-of-Pocket Psychologist, forensic ([employer]) in Cancer Patients  Patient was seen in exam room to complete 6 month questionnaires. Patient asked that research staff read questions to her, and she answered all questions. On question 18, patient corrected her answer, and the answer was corrected on the form with incorrect answer struck through and initialed.   Victory Gravel Treyce Spillers, RN, BSN, [facility] She  Her  Hers Clinical Research Nurse [facility] Direct Dial [phone] 09/21/2023 11:39 AM  ADDENDUM: To correct time point. This was to complete the 3 month questionnaires.  Victory Gravel Yonis Carreon, RN, BSN, [facility] She  Her  Hers Clinical Research Nurse [facility] Direct Dial [phone] 09/21/2023 11:41 AM

## 2023-09-25 ENCOUNTER — Other Ambulatory Visit: Payer: Self-pay

## 2023-09-25 NOTE — Progress Notes (Unsigned)
 [facility]       Telephone: [phone]?Fax: [phone]   Oncology Clinical Pharmacist Practitioner Initial Assessment  Catherine Lowery is a 79 y.o. female with a diagnosis of breast cancer. They were contacted today via in-person visit.She is here with her two sisters, Bernardo Bridgeman and Garey Jung.  Indication/Regimen Abemaciclib  (Verzenio ) is being used appropriately for treatment of metastatic breast cancer by Dr. Murleen Arms.     Wt Readings from Last 1 Encounters:  09/26/23 133 lb (60.3 kg)    Estimated body surface area is 1.72 meters squared as calculated from the following:   Height as of 09/10/23: 5' 9.5" (1.765 m).   Weight as of this encounter: 133 lb (60.3 kg).  The dosing regimen is 100 mg by mouth every 12 hours on days 1 to 28 of a 28-day cycle. This is being given  in combination with letrozole . It is planned to continue until disease progression or unacceptable toxicity. Prescription dose and frequency assessed for appropriateness.  Patient has agreed to treatment which is documented in physician note on 09/18/23. Counseled patient on administration, dosing, side effects, monitoring, drug-food interactions, safe handling, storage, and disposal.  Abemaciclib  will be delivered today and she will start tomorrow (09/27/23). She has labs and visit with Dr. Arno Bibles scheduled for 10/16/23.   Dose Modifications Per Dr. Arno Bibles, starting abemaciclib  at 100 mg by mouth every 12 hours  Access Assessment ROSELYNNE LORTZ will be receiving abemaciclib  through [facility] Concerns: None Start date if known: 09/27/23  Adherence Assessment Reviewed importance on keeping a med schedule and plan for any missed doses Barriers to adherence identified? No  Allergies Allergies  Allergen Reactions   Morphine And Codeine Itching and Nausea And Vomiting   Percocet [Oxycodone -Acetaminophen ] Itching and Nausea And Vomiting   Vitals    09/26/2023     9:10 AM 09/18/2023    8:45 AM 09/10/2023    3:12 PM  Oncology Vitals  Height   177 cm  Weight 60.328 kg 62.415 kg 63.231 kg  Weight (lbs) 133 lbs 137 lbs 10 oz 139 lbs 6 oz  BMI 19.36 kg/m2 20.03 kg/m2 20.29 kg/m2  Temp 98 F (36.7 C) 98.5 F (36.9 C) 98.1 F (36.7 C)  Pulse Rate 61 82 60  BP 116/56 133/50 119/58  Resp 16 17 16   SpO2 99 % 99 % 100 %  BSA (m2) 1.72 m2 1.75 m2 1.76 m2     Laboratory Data: no labs done today. Baseline labs from 09/18/23    Latest Ref Rng & Units 09/18/2023    9:39 AM 09/01/2023    2:46 AM 08/29/2023    5:18 AM  CBC EXTENDED  WBC 4.0 - 10.5 K/uL 5.2  4.2  4.2   RBC 3.87 - 5.11 MIL/uL 3.44  3.08  3.03   Hemoglobin 12.0 - 15.0 g/dL 8.3  7.6  7.6   HCT 29.5 - 46.0 % 25.4  23.8  24.2   Platelets 150 - 400 K/uL 176  148  121   NEUT# 1.7 - 7.7 K/uL 4.0     Lymph# 0.7 - 4.0 K/uL 0.4          Latest Ref Rng & Units 09/18/2023    9:39 AM 09/03/2023    3:07 AM 09/02/2023    9:40 AM  CMP  Glucose 70 - 99 mg/dL 621  99  308   BUN 8 - 23 mg/dL 17  11  10  Creatinine 0.44 - 1.00 mg/dL 5.78  4.69  6.29   Sodium 135 - 145 mmol/L 139  139  139   Potassium 3.5 - 5.1 mmol/L 3.4  3.8  3.7   Chloride 98 - 111 mmol/L 107  103  103   CO2 22 - 32 mmol/L 26  26  26    Calcium  8.9 - 10.3 mg/dL 8.9  9.0  9.0   Total Protein 6.5 - 8.1 g/dL 7.4     Total Bilirubin 0.0 - 1.2 mg/dL 0.3     Alkaline Phos 38 - 126 U/L 65     AST 15 - 41 U/L 21     ALT 0 - 44 U/L 16      Lab Results  Component Value Date   CA2729 34.9 09/18/2023   CA2729 44.1 (H) 05/09/2023   Contraindications Contraindications were reviewed? Yes Contraindications to therapy were identified? No   Safety Precautions The following safety precautions for the use of abemaciclib were reviewed:  Changes in kidney function: importance of drinking plenty of fluids and monitoring urine output Diarrhea: we reviewed that diarrhea is common with abemaciclib and confirmed that she does have loperamide  (Imodium) at home.  We reviewed how to take this medication PRN and gave her information on abemaciclib Decreased white blood cells (WBCs) and increased risk for infection: we discussed the importance of having a thermometer and what the Centers for Disease Control and Prevention (CDC) considers a fever which is 100.41F (38C) or higher.  Gave patient 24/7 triage line to call if any fevers or symptoms Decreased hemoglobin, part of red blood cells that carry iron and oxygen Fatigue Nausea and Vomiting Hepatotoxicity: reviewed to contact clinic for RUQ pain that will not subside, yellowing of eyes/skin Decreased appetite or weight loss Abdominal pain Decreased platelet count and increased risk for bleeding Venous thromboembolism (VTE): reviewed signs of deep vein thrombosis (DVT) such as leg swelling, redness, pain, or tenderness and signs of pulmonary embolism (PE) such as shortness of breath, rapid or irregular heartbeat, cough, chest pain, or lightheadedness ILD/Pneumonitis: we reviewed potential symptoms including cough, shortness, and fatigue. Handling body fluids and waste Pregnancy, sexual activity, and contraception Avoid grapefruit products Reviewed to take the medication every 12 hours (with food sometimes can be easier on the stomach) and to take it at the same time every day. Discussed proper storage and handling of abemaciclib  Medication Reconciliation Current Outpatient Medications  Medication Sig Dispense Refill   albuterol  (VENTOLIN  HFA) 108 (90 Base) MCG/ACT inhaler Inhale 2 puffs into the lungs every 4 (four) hours as needed for wheezing or shortness of breath.     amLODipine  (NORVASC ) 10 MG tablet Take 1 tablet (10 mg total) by mouth daily. 90 tablet 1   aspirin  (ASPIRIN  ADULT) 325 MG tablet enteric coated; may use OTC 30 tablet 0   atorvastatin  (LIPITOR) 40 MG tablet Take 1 tablet (40 mg total) by mouth daily. 90 tablet 1   letrozole (FEMARA) 2.5 MG tablet Take 1 tablet (2.5  mg total) by mouth daily. 90 tablet 3   levothyroxine  (SYNTHROID ) 50 MCG tablet Take 1 tablet (50 mcg total) by mouth daily. 90 tablet 3   lidocaine -prilocaine  (EMLA ) cream Apply to affected area once (Patient taking differently: Apply 1 Application topically daily as needed [facility] for chemo).) 30 g 3   losartan  (COZAAR ) 25 MG tablet Take 25 mg by mouth daily.     Multiple Vitamin (MULTIVITAMIN WITH MINERALS) TABS tablet Take 1 tablet by  mouth daily.     POTASSIUM PO Take 1 tablet by mouth daily.     primidone  (MYSOLINE ) 50 MG tablet Take 2 tablets (100 mg total) by mouth every morning. 180 tablet 2   propranolol  (INDERAL ) 40 MG tablet Take 0.5 tablets (20 mg total) by mouth 2 (two) times daily. (Patient taking differently: Take 20 mg by mouth daily.) 90 tablet 1   VOLTAREN 1 % GEL APPLY TOPICALLY 4 TIMES DAILY AS NEEDED (Patient taking differently: Apply 2 g topically daily as needed (Arthritis hip pain).) 100 g 0   abemaciclib (VERZENIO) 100 MG tablet Take 1 tablet (100 mg total) by mouth 2 (two) times daily. (Patient not taking: Reported on 09/26/2023) 56 tablet 2   bisacodyl  (DULCOLAX) 5 MG EC tablet Take 2 tablets (10 mg total) by mouth daily as needed for moderate constipation. (Patient not taking: Reported on 09/26/2023)     ondansetron  (ZOFRAN ) 8 MG tablet Take 1 tablet (8 mg total) by mouth every 8 (eight) hours as needed for nausea or vomiting. Start on the third day after carboplatin . (Patient not taking: Reported on 09/26/2023) 30 tablet 1   prochlorperazine  (COMPAZINE ) 10 MG tablet Take 1 tablet (10 mg total) by mouth every 6 (six) hours as needed for nausea or vomiting. (Patient not taking: Reported on 09/26/2023) 30 tablet 1   No current facility-administered medications for this visit.   Medication reconciliation is based on the patient's most recent medication list in the electronic medical record (EMR) including herbal products and OTC medications.   The patient's medication list was  reviewed today with the patient? Yes   Drug-drug interactions (DDIs) DDIs were evaluated? Yes Significant DDIs identified? Yes , there is possible DDI with primidone  (CYP3A4 inducer) and abemaciclib (CYP3A4 substrate). Dr. Arno Bibles aware and patient aware. Reached out to Dr. Avelina Leitz to see if he would like to discuss alternate therapy from primidone .  Drug-Food Interactions Drug-food interactions were evaluated? Yes Drug-food interactions identified? Grapefruit products  Follow-up Plan  Patient education handout given to patient Start abemaciclib 100 mg by mouth every 12 hours. Will be delivered today. Will start tomorrow Continue letrozole 2.5 mg by mouth daily.  Monitor for toxicities. Port labs, Dr. Arno Bibles visit scheduled for 10/16/23 As above, patient and Dr. Arno Bibles aware of possible interaction with primidone  and abemaciclib. Also reached out to Dr. Avelina Leitz who originally prescribed primidone  per patient report. Refills have been done by PCP. Monitor efficacy. Doyce General can follow up with clinical pharmacy as deemed necessary by Dr. Bernis Brisker Iruku going forward   Doyce General participated in the discussion, expressed understanding, and voiced agreement with the above plan. All questions were answered to her satisfaction. The patient was advised to contact the clinic at [phone] with any questions or concerns prior to her return visit.   I spent 60 minutes assessing the patient.  Jullisa Grigoryan A. Webb Hake, PharmD, BCOP, CPP  Althea Atkinson, RPH-CPP, 09/26/2023 9:49 AM  **Disclaimer: This note was dictated with voice recognition software. Similar sounding words can inadvertently be transcribed and this note may contain transcription errors which may not have been corrected upon publication of note.**

## 2023-09-26 ENCOUNTER — Encounter: Payer: Self-pay | Admitting: Hematology and Oncology

## 2023-09-26 ENCOUNTER — Inpatient Hospital Stay: Admitting: Pharmacist

## 2023-09-26 VITALS — BP 116/56 | HR 61 | Temp 98.0°F | Resp 16 | Wt 133.0 lb

## 2023-09-26 DIAGNOSIS — C50311 Malignant neoplasm of lower-inner quadrant of right female breast: Secondary | ICD-10-CM

## 2023-09-26 DIAGNOSIS — C562 Malignant neoplasm of left ovary: Secondary | ICD-10-CM | POA: Diagnosis not present

## 2023-09-26 NOTE — Progress Notes (Signed)
 Patient counseled in clinic in-person visit note on 09/26/23

## 2023-09-28 ENCOUNTER — Ambulatory Visit (HOSPITAL_COMMUNITY)

## 2023-10-01 ENCOUNTER — Other Ambulatory Visit (HOSPITAL_COMMUNITY): Payer: Self-pay

## 2023-10-01 ENCOUNTER — Telehealth: Payer: Self-pay

## 2023-10-01 NOTE — Telephone Encounter (Signed)
-----   Message from Brenton [facility] sent at 09/30/2023  5:38 PM EDT ----- Regarding: FW: Primidone  and Abemaciclib  Pls setup virtual visit to discuss this with me. -VRP ----- Message ----- From: Murleen Arms, MD Sent: 09/28/2023   1:45 PM EDT To: Omega Bible, MD; Paullette Boston, RN; # Subject: RE: Primidone  and Abemaciclib                   Thank you Dr Judieth Nova, ok to start verzenio , Please ask the pt to see Dr Salli Crawley back so she can be offered alternate medications and they may help with primidone  taper. Ok to place a referral to Dr Salli Crawley, if this is needed since it has been many yrs from the last appointment.  Praveena ----- Message ----- From: Omega Bible, MD Sent: 09/28/2023  12:48 PM EDT To: Wilhemena Harbour, NP; Murleen Arms, MD; # Subject: RE: Primidone  and Abemaciclib                   I have not seen patient since 2019. Primidone  was prescribed for essential tremor mgmt. I am ok with her tapering off this if the interaction is causing an issue. Propranolol  and gabapentin  are other options.   -VRP ----- Message ----- From: Althea Atkinson, RPH-CPP Sent: 09/26/2023   9:27 AM EDT To: Omega Bible, MD; Murleen Arms, MD; # Subject: Primidone  and Abemaciclib                       Dr. Salli Crawley,  Ms. Seyer will be starting abemaciclib  under the care of Dr. Arno Bibles. There is a potential interaction with primidone  and abemaciclib  because primidone  is thought to induce the metabolism. Most literature says to avoid the combination if possible.  We can closely monitor Ms. Hildy Lowers for efficacy but she was asking if she could use something else besides primidone . I wanted to send this information to you since I believe you initially prescribed it. Please let me know if you have questions. Thank you.  Regards, Arrie Lares A. Webb Hake, PharmD, BCOP, CPP 09/26/2023 9:27 AM

## 2023-10-01 NOTE — Telephone Encounter (Signed)
 Attempted to call Pt regarding setting up VV at Dr. Elon Hakim request. No answer, LVM for call back.

## 2023-10-04 ENCOUNTER — Telehealth: Payer: Self-pay | Admitting: *Deleted

## 2023-10-04 ENCOUNTER — Ambulatory Visit (HOSPITAL_COMMUNITY)
Admission: RE | Admit: 2023-10-04 | Discharge: 2023-10-04 | Disposition: A | Discharge status: Home / Self Care | Source: Ambulatory Visit | Attending: Hematology and Oncology | Admitting: Hematology and Oncology

## 2023-10-04 DIAGNOSIS — C50311 Malignant neoplasm of lower-inner quadrant of right female breast: Secondary | ICD-10-CM | POA: Insufficient documentation

## 2023-10-04 DIAGNOSIS — D6481 Anemia due to antineoplastic chemotherapy: Secondary | ICD-10-CM | POA: Insufficient documentation

## 2023-10-04 DIAGNOSIS — Z170 Estrogen receptor positive status [ER+]: Secondary | ICD-10-CM | POA: Insufficient documentation

## 2023-10-04 DIAGNOSIS — C562 Malignant neoplasm of left ovary: Secondary | ICD-10-CM | POA: Insufficient documentation

## 2023-10-04 DIAGNOSIS — T451X5A Adverse effect of antineoplastic and immunosuppressive drugs, initial encounter: Secondary | ICD-10-CM | POA: Insufficient documentation

## 2023-10-04 DIAGNOSIS — D61818 Other pancytopenia: Secondary | ICD-10-CM | POA: Insufficient documentation

## 2023-10-04 MED ORDER — HEPARIN SOD (PORK) LOCK FLUSH 100 UNIT/ML IV SOLN
250.0000 [IU] | INTRAVENOUS | Status: AC | PRN
  Administered 2023-10-04: 250 [IU]

## 2023-10-04 MED ORDER — GADOBUTROL 1 MMOL/ML IV SOLN
6.0000 mL | Freq: Once | INTRAVENOUS | Status: AC | PRN
  Administered 2023-10-04: 6 mL via INTRAVENOUS

## 2023-10-04 NOTE — Telephone Encounter (Signed)
 This RN called to the pt's sister - Garey Jung - obtained identified VM- detailed message left stating plan of starting the Verzenio  and need to follow up with Dr Salli Crawley.  This RN's name and return call number given for return communication.  Pt does not have an active My Chart account.

## 2023-10-08 ENCOUNTER — Encounter: Payer: Self-pay | Admitting: Diagnostic Neuroimaging

## 2023-10-08 ENCOUNTER — Ambulatory Visit (INDEPENDENT_AMBULATORY_CARE_PROVIDER_SITE_OTHER): Admitting: Diagnostic Neuroimaging

## 2023-10-08 VITALS — BP 127/59 | HR 53 | Ht 69.0 in | Wt 137.0 lb

## 2023-10-08 DIAGNOSIS — Z853 Personal history of malignant neoplasm of breast: Secondary | ICD-10-CM

## 2023-10-08 DIAGNOSIS — G250 Essential tremor: Secondary | ICD-10-CM

## 2023-10-08 NOTE — Patient Instructions (Addendum)
 ESSENTIAL TREMOR - currently on primidone  100mg  daily; due to starting new chemo med Abemaciclib  (Verzenio ), we will wean off primidone  (reduce to 50mg  twice a day x 2 weeks, then 50mg  daily x 2 weeks, then stop); monitor symptoms  - continue propranolol  LA 80mg  daily - could consider gabapentin , topiramate or levetiracetam in future if needed

## 2023-10-08 NOTE — Progress Notes (Signed)
 [facility]  PATIENT: Catherine Lowery DOB: 1946-04-24  REFERRING CLINICIAN: Hershell Lose, NP  HISTORY FROM: patient  REASON FOR VISIT: follow up   HISTORICAL  CHIEF COMPLAINT:  Chief Complaint  Patient presents with   Follow-up    Rm 6 with sister Garey Jung  Pt is well, here to discuss Primidone  and Abemaciclib . She has started a new cancer treatment.     HISTORY OF PRESENT ILLNESS:   UPDATE (10/08/23, VRP): Since last visit, recently started a new med Abemaciclib  (Verzenio ) for her metastatic breast cancer, but has some interaction with primidone . Heme/onc asking if she can be weaned off primidone . Tremor sxs are stable.   UPDATE (06/06/17, VRP): Since last visit, doing well. Tolerating meds. No alleviating or aggravating factors. Tremor stable.   PRIOR HPI (06/06/16, VRP): 78 year old female here for evaluation of essential tremor.   Patient had onset of tremor at age 85 years old. She's had trouble throughout her life.   Age 21 results she was diagnosed with essential tremor.   At age 75 years old she started on propranolol .   At age 71 year old she was started on primidone .  Patient has multiple family members on her father's side who also have essential tremor. Left hand is more affected than right hand and when she is picking up objects or hold her arms out. Medications have helped her tremor. Patient was seeing another neurologist in [place] but now trying to establish with local neurologist in [place].   REVIEW OF SYSTEMS: Full 14 system review of systems performed and negative with exception of: tremor.  ALLERGIES: Allergies  Allergen Reactions   Morphine And Codeine Itching and Nausea And Vomiting   Percocet [Oxycodone -Acetaminophen ] Itching and Nausea And Vomiting    HOME MEDICATIONS: Outpatient Medications Prior to Visit  Medication Sig Dispense Refill   abemaciclib  (VERZENIO ) 100 MG tablet Take 1 tablet (100 mg total) by mouth 2  (two) times daily. 56 tablet 2   albuterol  (VENTOLIN  HFA) 108 (90 Base) MCG/ACT inhaler Inhale 2 puffs into the lungs every 4 (four) hours as needed for wheezing or shortness of breath.     amLODipine  (NORVASC ) 10 MG tablet Take 1 tablet (10 mg total) by mouth daily. 90 tablet 1   aspirin  (ASPIRIN  ADULT) 325 MG tablet enteric coated; may use OTC 30 tablet 0   atorvastatin  (LIPITOR) 40 MG tablet Take 1 tablet (40 mg total) by mouth daily. 90 tablet 1   letrozole  (FEMARA ) 2.5 MG tablet Take 1 tablet (2.5 mg total) by mouth daily. 90 tablet 3   levothyroxine  (SYNTHROID ) 50 MCG tablet Take 1 tablet (50 mcg total) by mouth daily. 90 tablet 3   lidocaine -prilocaine  (EMLA ) cream Apply to affected area once (Patient taking differently: Apply 1 Application topically daily as needed [facility] for chemo).) 30 g 3   losartan  (COZAAR ) 25 MG tablet Take 25 mg by mouth daily.     Multiple Vitamin (MULTIVITAMIN WITH MINERALS) TABS tablet Take 1 tablet by mouth daily.     ondansetron  (ZOFRAN ) 8 MG tablet Take 1 tablet (8 mg total) by mouth every 8 (eight) hours as needed for nausea or vomiting. Start on the third day after carboplatin . 30 tablet 1   POTASSIUM PO Take 1 tablet by mouth daily.     primidone  (MYSOLINE ) 50 MG tablet Take 2 tablets (100 mg total) by mouth every morning. 180 tablet 2   prochlorperazine  (COMPAZINE ) 10 MG tablet Take 1 tablet (10 mg total) by mouth every 6 (  six) hours as needed for nausea or vomiting. 30 tablet 1   propranolol  (INDERAL ) 40 MG tablet Take 0.5 tablets (20 mg total) by mouth 2 (two) times daily. (Patient taking differently: Take 20 mg by mouth daily.) 90 tablet 1   VOLTAREN 1 % GEL APPLY TOPICALLY 4 TIMES DAILY AS NEEDED (Patient taking differently: Apply 2 g topically daily as needed (Arthritis hip pain).) 100 g 0   bisacodyl  (DULCOLAX) 5 MG EC tablet Take 2 tablets (10 mg total) by mouth daily as needed for moderate constipation. (Patient not taking: Reported on 09/26/2023)      No facility-administered medications prior to visit.    PAST MEDICAL HISTORY: Past Medical History:  Diagnosis Date   Cancer (HCC) 03/2018   right breast cancer    ovarian   Chronic kidney disease    CKD   COPD (chronic obstructive pulmonary disease) (HCC)    DJD (degenerative joint disease)    LEFT HIP   Family history of breast cancer    Family history of ovarian cancer    Family history of prostate cancer    Family history of prostate cancer    H/O migraine 07/01/2019   Hyperlipidemia    takes Simvasatin daily   Hypertension    takes Propranolol  and HCTZ daioly   Hyperthyroidism    Pneumonia    x2   PONV (postoperative nausea and vomiting)    when ether used   Reactive airway disease 05/08/2023   Tremors of nervous system    takes Primidone  daily    PAST SURGICAL HISTORY: Past Surgical History:  Procedure Laterality Date   BOne Spur     left heel   BOWEL RESECTION N/A 08/21/2023   Procedure: SMALL BOWEL RESECTION WITH SIDE-TO-SIDE, FUNCTIONAL END-TO-END ANASTOMOSIS;  Surgeon: Derrel Flies, MD;  Location: [facility];  Service: Gynecology;  Laterality: N/A;   BREAST LUMPECTOMY WITH RADIOACTIVE SEED AND SENTINEL LYMPH NODE BIOPSY Right 05/07/2018   Procedure: RIGHT BREAST LUMPECTOMY WITH RADIOACTIVE SEED AND RIGHT  SENTINEL LYMPH NODE MAPPING;  Surgeon: Sim Dryer, MD;  Location: [facility];  Service: General;  Laterality: Right;   COLONOSCOPY     IR IMAGING GUIDED PORT INSERTION  05/10/2023   IR US  GUIDE BX ASP/DRAIN  05/10/2023   JOINT REPLACEMENT Bilateral    knee   KNEE SURGERY Left    lateral muscle release in [employer]   OMENTECTOMY N/A 08/21/2023   Procedure: OMENTECTOMY;  Surgeon: Derrel Flies, MD;  Location: [facility];  Service: Gynecology;  Laterality: N/A;  via robot approach   RE-EXCISION OF BREAST LUMPECTOMY Right 06/05/2018   Procedure: RE-EXCISION OF RIGHT BREAST LUMPECTOMY;  Surgeon: Sim Dryer, MD;  Location: [facility];  Service: General;  Laterality: Right;   ROBOTIC ASSISTED TOTAL HYSTERECTOMY WITH BILATERAL SALPINGO OOPHERECTOMY N/A 08/21/2023   Procedure: ROBOTIC ASSISTED TOTAL LAPAROSCOPY HYSTERECTOMY WITH BILATERAL SALPINGO-OOPHORECTOMY, RADICAL DISSECTION FOR DEBULKING;  Surgeon: Derrel Flies, MD;  Location: [facility];  Service: Gynecology;  Laterality: N/A;   ROTATOR CUFF REPAIR Bilateral    TOTAL HIP ARTHROPLASTY Left 12/29/2015   Procedure: TOTAL HIP ARTHROPLASTY ANTERIOR APPROACH;  Surgeon: Ferd Householder, MD;  Location: [facility];  Service: Orthopedics;  Laterality: Left;    FAMILY HISTORY: Family History  Problem Relation Age of Onset   Hypertension Mother    Heart disease Mother    Stroke Mother    Multiple myeloma Father 51   Thyroid  cancer Sister  dx 40's/50's   Cancer Paternal Aunt        unknown   Prostate cancer Paternal Uncle        dx >50- cancer was the cause of his death   Ovarian cancer Paternal Aunt    Throat cancer Paternal Uncle    Breast cancer Cousin        dx >50   Cancer Cousin        type unk   Cancer Cousin        type unk   Cancer Cousin        type unk    SOCIAL HISTORY:  Social History   Socioeconomic History   Marital status: Single    Spouse name: Not on file   Number of children: 1   Years of education: 14   Highest education level: Not on file  Occupational History    Comment: retired  Tobacco Use   Smoking status: Former    Current packs/day: 0.75    Average packs/day: 0.8 packs/day for 28.0 years (21.0 ttl pk-yrs)    Types: Cigarettes    Passive exposure: Never   Smokeless tobacco: Never   Tobacco comments:    02/13/22 Pt stated "I'm trying to quit" Harding Li, CMA     Quit February 2025  Vaping Use   Vaping status: Never Used  Substance and Sexual Activity   Alcohol use: No    Alcohol/week: 0.0 standard drinks of alcohol   Drug use: No   Sexual activity: Not Currently    Birth control/protection:  Post-menopausal  Other Topics Concern   Not on file  Social History Narrative   Lives with handicapped son   Caffeine- coffee, 2 cups daily, 1-2 sodas daily   Social Drivers of Health   Financial Resource Strain: Low Risk  (01/28/2020)   Overall Financial Resource Strain (CARDIA)    Difficulty of Paying Living Expenses: Not very hard  Food Insecurity: No Food Insecurity (08/21/2023)   Hunger Vital Sign    Worried About Running Out of Food in the Last Year: Never true    Ran Out of Food in the Last Year: Never true  Transportation Needs: No Transportation Needs (08/21/2023)   PRAPARE - Administrator, Civil Service (Medical): No    Lack of Transportation (Non-Medical): No  Physical Activity: Not on file  Stress: Not on file  Social Connections: Moderately Integrated (08/21/2023)   Social Connection and Isolation Panel [NHANES]    Frequency of Communication with Friends and Family: More than three times a week    Frequency of Social Gatherings with Friends and Family: Three times a week    Attends Religious Services: More than 4 times per year    Active Member of Clubs or Organizations: Yes    Attends Banker Meetings: Never    Marital Status: Divorced  Catering manager Violence: Not At Risk (08/21/2023)   Humiliation, Afraid, Rape, and Kick questionnaire    Fear of Current or Ex-Partner: No    Emotionally Abused: No    Physically Abused: No    Sexually Abused: No     PHYSICAL EXAM  GENERAL EXAM/CONSTITUTIONAL: Vitals:  Vitals:   10/08/23 1351  BP: (!) 127/59  Pulse: (!) 53  Weight: 137 lb (62.1 kg)  Height: 5\' 9"  (1.753 m)   Body mass index is 20.23 kg/m. No results found. Patient is in no distress; well developed, nourished and groomed; neck is supple  CARDIOVASCULAR: Examination  of carotid arteries is normal; no carotid bruits Regular rate and rhythm, no murmurs Examination of peripheral vascular system by observation and palpation is  normal  EYES: Ophthalmoscopic exam of optic discs and posterior segments is normal; no papilledema or hemorrhages  MUSCULOSKELETAL: Gait, strength, tone, movements noted in Neurologic exam below  NEUROLOGIC: MENTAL STATUS:      No data to display         awake, alert, oriented to person, place and time recent and remote memory intact normal attention and concentration language fluent, comprehension intact, naming intact,  fund of knowledge appropriate  CRANIAL NERVE:  2nd - no papilledema on fundoscopic exam 2nd, 3rd, 4th, 6th - pupils equal and reactive to light, visual fields full to confrontation, extraocular muscles intact, no nystagmus 5th - facial sensation symmetric 7th - facial strength symmetric 8th - hearing intact 9th - palate elevates symmetrically, uvula midline 11th - shoulder shrug symmetric 12th - tongue protrusion midline  MOTOR:  normal bulk and tone, full strength in the BUE, BLE MILD POSTURAL TREMOR IN LUE > RUE MILD HEAD TREMOR TREMOR WITH HANDWRITING  SENSORY:  normal and symmetric to light touch, temperature, vibration  COORDINATION:  finger-nose-finger --> MODERATE INTENTION TREMOR (L > R) fine finger movements and tapping normal  REFLEXES:  deep tendon reflexes --> BUE 2; KNEES TRACE; ANKLES TRACE  GAIT/STATION:  narrow based gait; able to walk tandem     DIAGNOSTIC DATA (LABS, IMAGING, TESTING) - I reviewed patient records, labs, notes, testing and imaging myself where available.  Lab Results  Component Value Date   WBC 5.2 09/18/2023   HGB 8.3 (L) 09/18/2023   HCT 25.4 (L) 09/18/2023   MCV 73.8 (L) 09/18/2023   PLT 176 09/18/2023      Component Value Date/Time   NA 139 09/18/2023 0939   NA 139 11/22/2020 0000   K 3.4 (L) 09/18/2023 0939   CL 107 09/18/2023 0939   CO2 26 09/18/2023 0939   GLUCOSE 127 (H) 09/18/2023 0939   BUN 17 09/18/2023 0939   CREATININE 0.78 09/18/2023 0939   CREATININE 1.05 (H) 05/19/2019 1034    CALCIUM  8.9 09/18/2023 0939   PROT 7.4 09/18/2023 0939   ALBUMIN  3.6 09/18/2023 0939   AST 21 09/18/2023 0939   ALT 16 09/18/2023 0939   ALKPHOS 65 09/18/2023 0939   BILITOT 0.3 09/18/2023 0939   GFRNONAA >60 09/18/2023 0939   GFRNONAA 48 (L) 01/23/2018 0804   GFRAA >60 06/23/2019 1524   GFRAA 56 (L) 01/23/2018 0804   Lab Results  Component Value Date   CHOL 133 12/02/2020   HDL 35 (L) 12/02/2020   LDLCALC 79 12/02/2020   TRIG 105 12/02/2020   CHOLHDL 3.8 12/02/2020   Lab Results  Component Value Date   HGBA1C 5.4 12/02/2020   Lab Results  Component Value Date   VITAMINB12 285 05/09/2023   Lab Results  Component Value Date   TSH 0.436 09/18/2023        ASSESSMENT AND PLAN  78 y.o. year old female here with essential tremor, well controlled on propranolol  and primidone .   Dx:  1. Essential tremor   2. History of breast cancer     PLAN:  ESSENTIAL TREMOR - currently on primidone  100mg  daily; due to starting new chemo med Abemaciclib  (Verzenio ), we will wean off primidone  (reduce to 50mg  twice a day x 2 weeks, then 50mg  daily x 2 weeks, then stop); monitor symptoms  - continue propranolol  LA 80mg  daily -  could consider gabapentin , topiramate or levetiracetam in future if needed  Return for pending if symptoms worsen or fail to improve.    Omega Bible, MD 10/08/2023, 2:19 PM Certified in Neurology, Neurophysiology and Neuroimaging  [facility] [address], [address] [place], [name] [address] [phone]

## 2023-10-15 ENCOUNTER — Telehealth: Payer: Self-pay

## 2023-10-15 NOTE — Telephone Encounter (Signed)
 Verbally confirmed appt for 6/17

## 2023-10-16 ENCOUNTER — Inpatient Hospital Stay: Attending: Psychiatry | Admitting: Hematology and Oncology

## 2023-10-16 ENCOUNTER — Inpatient Hospital Stay: Attending: Psychiatry

## 2023-10-16 VITALS — BP 113/44 | Temp 97.5°F | Resp 16 | Wt 134.1 lb

## 2023-10-16 DIAGNOSIS — Z1732 Human epidermal growth factor receptor 2 negative status: Secondary | ICD-10-CM | POA: Insufficient documentation

## 2023-10-16 DIAGNOSIS — C562 Malignant neoplasm of left ovary: Secondary | ICD-10-CM

## 2023-10-16 DIAGNOSIS — Z170 Estrogen receptor positive status [ER+]: Secondary | ICD-10-CM | POA: Diagnosis not present

## 2023-10-16 DIAGNOSIS — Z87891 Personal history of nicotine dependence: Secondary | ICD-10-CM | POA: Diagnosis not present

## 2023-10-16 DIAGNOSIS — C50311 Malignant neoplasm of lower-inner quadrant of right female breast: Secondary | ICD-10-CM

## 2023-10-16 DIAGNOSIS — C786 Secondary malignant neoplasm of retroperitoneum and peritoneum: Secondary | ICD-10-CM | POA: Insufficient documentation

## 2023-10-16 DIAGNOSIS — Z79899 Other long term (current) drug therapy: Secondary | ICD-10-CM | POA: Diagnosis not present

## 2023-10-16 DIAGNOSIS — Z1721 Progesterone receptor positive status: Secondary | ICD-10-CM | POA: Insufficient documentation

## 2023-10-16 DIAGNOSIS — Z79811 Long term (current) use of aromatase inhibitors: Secondary | ICD-10-CM | POA: Diagnosis not present

## 2023-10-16 DIAGNOSIS — D61818 Other pancytopenia: Secondary | ICD-10-CM

## 2023-10-16 LAB — CMP (CANCER CENTER ONLY)
ALT: 29 U/L (ref 0–44)
AST: 28 U/L (ref 15–41)
Albumin: 3.7 g/dL (ref 3.5–5.0)
Alkaline Phosphatase: 79 U/L (ref 38–126)
Anion gap: 7 (ref 5–15)
BUN: 20 mg/dL (ref 8–23)
CO2: 22 mmol/L (ref 22–32)
Calcium: 9.3 mg/dL (ref 8.9–10.3)
Chloride: 110 mmol/L (ref 98–111)
Creatinine: 1.15 mg/dL — ABNORMAL HIGH (ref 0.44–1.00)
GFR, Estimated: 48 mL/min — ABNORMAL LOW (ref >60)
Glucose, Bld: 91 mg/dL (ref 70–99)
Potassium: 4.2 mmol/L (ref 3.5–5.1)
Sodium: 139 mmol/L (ref 135–145)
Total Bilirubin: 0.4 mg/dL (ref 0.0–1.2)
Total Protein: 7.9 g/dL (ref 6.5–8.1)

## 2023-10-16 LAB — CBC WITH DIFFERENTIAL/PLATELET
Abs Immature Granulocytes: 0.01 10*3/uL (ref 0.00–0.07)
Basophils Absolute: 0 10*3/uL (ref 0.0–0.1)
Basophils Relative: 1 %
Eosinophils Absolute: 0.1 10*3/uL (ref 0.0–0.5)
Eosinophils Relative: 5 %
HCT: 26.6 % — ABNORMAL LOW (ref 36.0–46.0)
Hemoglobin: 8.7 g/dL — ABNORMAL LOW (ref 12.0–15.0)
Immature Granulocytes: 0 %
Lymphocytes Relative: 42 %
Lymphs Abs: 1.1 10*3/uL (ref 0.7–4.0)
MCH: 23.1 pg — ABNORMAL LOW (ref 26.0–34.0)
MCHC: 32.7 g/dL (ref 30.0–36.0)
MCV: 70.7 fL — ABNORMAL LOW (ref 80.0–100.0)
Monocytes Absolute: 0.3 10*3/uL (ref 0.1–1.0)
Monocytes Relative: 11 %
Neutro Abs: 1.1 10*3/uL — ABNORMAL LOW (ref 1.7–7.7)
Neutrophils Relative %: 41 %
Platelets: 87 10*3/uL — ABNORMAL LOW (ref 150–400)
RBC: 3.76 MIL/uL — ABNORMAL LOW (ref 3.87–5.11)
RDW: 17.4 % — ABNORMAL HIGH (ref 11.5–15.5)
WBC: 2.7 10*3/uL — ABNORMAL LOW (ref 4.0–10.5)
nRBC: 0 % (ref 0.0–0.2)

## 2023-10-16 LAB — SAMPLE TO BLOOD BANK

## 2023-10-16 LAB — TSH: TSH: 1.2 u[IU]/mL (ref 0.350–4.500)

## 2023-10-16 MED ORDER — HEPARIN SOD (PORK) LOCK FLUSH 100 UNIT/ML IV SOLN
500.0000 [IU] | Freq: Once | INTRAVENOUS | Status: AC
  Administered 2023-10-16: 500 [IU]

## 2023-10-16 MED ORDER — SODIUM CHLORIDE 0.9% FLUSH
10.0000 mL | Freq: Once | INTRAVENOUS | Status: AC
  Administered 2023-10-16: 10 mL

## 2023-10-16 NOTE — Progress Notes (Signed)
 Email sent to [facility] requesting [facility] testing on patient's surgical pathology samples from 08/21/2023

## 2023-10-16 NOTE — Progress Notes (Signed)
 [facility]  [phone] [phone]     ID: Catherine Lowery DOB: 01/27/1946  MR#: [id number]  JYN#:[id number]  Patient Care Team: Hershell Lose, NP as PCP - General (Nurse Practitioner) Hazle Lites, MD as PCP - Cardiology (Cardiology) Retta Caster, MD as Consulting Physician (Radiation Oncology) Saundra Curl, MD as Attending Physician (Orthopedic Surgery) Omega Bible, MD as Consulting Physician (Neurology) Myrle Aspen, [facility] (Inactive) as Pharmacist (Pharmacist) OTHER MD:   CHIEF COMPLAINT: Metastatic breast cancer  CURRENT TREATMENT: None   INTERVAL HISTORY: Discussed the use of AI scribe software for clinical note transcription with the patient, who gave verbal consent to proceed.  History of Present Illness Catherine Lowery is a 78 year old female with metastatic lobular breast cancer who presents for follow-up and management of her condition.  She started Verzenio  approximately three weeks ago and is experiencing side effects including diarrhea, which occurs two to three times a day and is managed with Imodium. She also experiences stomach pain, particularly after the evening dose of Verzenio , described as cramping similar to pain experienced during a previous hospital stay. Tylenol  and tramadol  are used for pain management, with tramadol  providing more effective relief.  Fatigue has worsened since starting Verzenio . Certain foods, such as pizza and barbecue chicken, exacerbate her stomach pain, suggesting a possible sensitivity to acidic or lactose-containing foods. Her current medication regimen includes Verzenio  taken twice daily and letrozole  taken once daily in the morning, which she tolerates well without additional side effects.  Her weight has fluctuated, currently at 134 pounds, down from a previous high of 147 pounds. No bone pain, changes in bowel habits aside from diarrhea, or urinary incontinence are  reported.   COVID 19 VACCINATION STATUS: Pfizer x4, last 10/2020   Breast cancer History  Catherine Lowery had routine screening mammography on 02/26/2018 showing a possible abnormality in the right breast. She underwent unilateral right diagnostic mammography with tomography and right breast ultrasonography at [facility] on 03/07/2018 showing: Breast Density Category B. On mammography, there is a new 1.5 cm irregular architectural distortion with a spiculated margin in the right breast at 5 o'clock anterior depth. On ultrasound, there is a 2.7 cm irregular solid mass in the right breast at 5 o'clock middle depth. This irregular mass displays posterior acoustic shadowing. No significant abnormalities were seen sonographically in the right axilla.  Accordingly on 03/18/2018 she proceeded to biopsy of the right breast area in question. The pathology from this procedure showed ([id number]): Invasive mammary carcinoma e-cadherin negative, grade II-III. 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 Prognostic indicators significant for: estrogen receptor, 95% positive and progesterone receptor, 95% positive, both with strong staining intensity. Proliferation marker Ki67 at 5%. HER2 negative by immunohistochemistry (1+).  On 04/10/2018 the patient underwent bilateral breast MRI with and without contrast showing a 2.3 cm spiculated enhancing mass associated with non-masslike enhancement, the total abnormal area measuring 4.6 cm.  There was no evidence of lymph node abnormality and the contralateral breast was benign appearing  The patient's subsequent history is as detailed below.   PAST MEDICAL HISTORY: Past Medical History:  Diagnosis Date   Cancer (HCC) 03/2018   right breast cancer    ovarian   Chronic kidney disease    CKD   COPD (chronic obstructive pulmonary disease) (HCC)    DJD (degenerative joint disease)    LEFT HIP   Family history of breast cancer    Family history of  ovarian cancer    Family history of prostate cancer    Family history of prostate cancer    H/O migraine 07/01/2019   Hyperlipidemia    takes Simvasatin daily   Hypertension    takes Propranolol  and HCTZ daioly   Hyperthyroidism    Pneumonia    x2   PONV (postoperative nausea and vomiting)    when ether used   Reactive airway disease 05/08/2023   Tremors of nervous system    takes Primidone  daily    PAST SURGICAL HISTORY: Past Surgical History:  Procedure Laterality Date   BOne Spur     left heel   BOWEL RESECTION N/A 08/21/2023   Procedure: SMALL BOWEL RESECTION WITH SIDE-TO-SIDE, FUNCTIONAL END-TO-END ANASTOMOSIS;  Surgeon: Derrel Flies, MD;  Location: [facility];  Service: Gynecology;  Laterality: N/A;   BREAST LUMPECTOMY WITH RADIOACTIVE SEED AND SENTINEL LYMPH NODE BIOPSY Right 05/07/2018   Procedure: RIGHT BREAST LUMPECTOMY WITH RADIOACTIVE SEED AND RIGHT  SENTINEL LYMPH NODE MAPPING;  Surgeon: Sim Dryer, MD;  Location: [facility];  Service: General;  Laterality: Right;   COLONOSCOPY     IR IMAGING GUIDED PORT INSERTION  05/10/2023   IR US  GUIDE BX ASP/DRAIN  05/10/2023   JOINT REPLACEMENT Bilateral    knee   KNEE SURGERY Left    lateral muscle release in [employer]   OMENTECTOMY N/A 08/21/2023   Procedure: OMENTECTOMY;  Surgeon: Derrel Flies, MD;  Location: [facility];  Service: Gynecology;  Laterality: N/A;  via robot approach   RE-EXCISION OF BREAST LUMPECTOMY Right 06/05/2018   Procedure: RE-EXCISION OF RIGHT BREAST LUMPECTOMY;  Surgeon: Sim Dryer, MD;  Location: [facility];  Service: General;  Laterality: Right;   ROBOTIC ASSISTED TOTAL HYSTERECTOMY WITH BILATERAL SALPINGO OOPHERECTOMY N/A 08/21/2023   Procedure: ROBOTIC ASSISTED TOTAL LAPAROSCOPY HYSTERECTOMY WITH BILATERAL SALPINGO-OOPHORECTOMY, RADICAL DISSECTION FOR DEBULKING;  Surgeon: Derrel Flies, MD;  Location: [facility];  Service: Gynecology;  Laterality: N/A;   ROTATOR  CUFF REPAIR Bilateral    TOTAL HIP ARTHROPLASTY Left 12/29/2015   Procedure: TOTAL HIP ARTHROPLASTY ANTERIOR APPROACH;  Surgeon: Ferd Householder, MD;  Location: [facility];  Service: Orthopedics;  Laterality: Left;    FAMILY HISTORY Family History  Problem Relation Age of Onset   Hypertension Mother    Heart disease Mother    Stroke Mother    Multiple myeloma Father 45   Thyroid  cancer Sister        dx 60's/50's   Cancer Paternal Aunt        unknown   Prostate cancer Paternal Uncle        dx >50- cancer was the cause of his death   Ovarian cancer Paternal Aunt    Throat cancer Paternal Uncle    Breast cancer Cousin        dx >50   Cancer Cousin        type unk   Cancer Cousin        type unk   Cancer Cousin        type unk  She notes that her father died from a multiple myeloma at age 14. Patients' mother died from stroke at age 31. The patient has 1 brother and 2 sisters. One paternal aunt had ovarian cancer.  One cousin had breast cancer, and her 66s. Another paternal aunt had cancer, type unknown to the patient, and a paternal uncle had prostate cancer.    GYNECOLOGIC HISTORY:  Menarche: 78 years old Age at  first live birth: 78 years old GX P: 1 LMP: Patient's last menstrual period was 01/24/2000. 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 Before that, she has been employed at [employer], [employer], and as an elderly caregiver. 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 She is separated, and has not spoken to her husband, Catherine Lowery, in over 10 years. She lives with her only son, Catherine Lowery, who is disabled secondary to SCD and multiple surgeries. She has no pets. She attends [employer].    ADVANCED DIRECTIVES: Her sister, Catherine Lowery, is Catherine Lowery's medical  power of attorney. Garey Jung can be reached at [phone]. Marovich has stated that her husband, Catherine Lowery, is not to have access to her medical records or to make decisions on her behalf. ]   HEALTH MAINTENANCE: Social History   Tobacco Use   Smoking status: Former    Current packs/day: 0.75    Average packs/day: 0.8 packs/day for 28.0 years (21.0 ttl pk-yrs)    Types: Cigarettes    Passive exposure: Never   Smokeless tobacco: Never   Tobacco comments:    02/13/22 Pt stated I'm trying to quit Harding Li, CMA     Quit February 2025  Vaping Use   Vaping status: Never Used  Substance Use Topics   Alcohol use: No    Alcohol/week: 0.0 standard drinks of alcohol   Drug use: No     Colonoscopy: 01/2015  PAP:   Bone density:  02/26/2018: T-score 1.4   Allergies  Allergen Reactions   Morphine And Codeine Itching and Nausea And Vomiting   Percocet [Oxycodone -Acetaminophen ] Itching and Nausea And Vomiting    Current Outpatient Medications  Medication Sig Dispense Refill   abemaciclib  (VERZENIO ) 100 MG tablet Take 1 tablet (100 mg total) by mouth 2 (two) times daily. 56 tablet 2   albuterol  (VENTOLIN  HFA) 108 (90 Base) MCG/ACT inhaler Inhale 2 puffs into the lungs every 4 (four) hours as needed for wheezing or shortness of breath.     amLODipine  (NORVASC ) 10 MG tablet Take 1 tablet (10 mg total) by mouth daily. 90 tablet 1   aspirin  (ASPIRIN  ADULT) 325 MG tablet enteric coated; may use OTC 30 tablet 0   atorvastatin  (LIPITOR) 40 MG tablet Take 1 tablet (40 mg total) by mouth daily. 90 tablet 1   letrozole  (FEMARA ) 2.5 MG tablet Take 1 tablet (2.5 mg total) by mouth daily. 90 tablet 3   levothyroxine  (SYNTHROID ) 50 MCG tablet Take 1 tablet (50 mcg total) by mouth daily. 90 tablet 3   lidocaine -prilocaine  (EMLA ) cream Apply to affected area once (Patient taking differently: Apply 1 Application topically daily as needed [facility] for chemo).) 30 g 3   losartan  (COZAAR ) 25 MG  tablet Take 25 mg by mouth daily.     Multiple Vitamin (MULTIVITAMIN WITH MINERALS) TABS tablet Take 1 tablet by mouth daily.     ondansetron  (ZOFRAN ) 8 MG tablet Take 1 tablet (8 mg total) by mouth every 8 (eight) hours as needed for nausea or vomiting. Start on the third day after carboplatin . 30 tablet 1   POTASSIUM PO Take 1 tablet by mouth daily.     primidone  (MYSOLINE ) 50 MG tablet Take 2 tablets (100 mg total) by mouth every morning. 180 tablet  2   prochlorperazine  (COMPAZINE ) 10 MG tablet Take 1 tablet (10 mg total) by mouth every 6 (six) hours as needed for nausea or vomiting. 30 tablet 1   propranolol  (INDERAL ) 40 MG tablet Take 0.5 tablets (20 mg total) by mouth 2 (two) times daily. (Patient taking differently: Take 20 mg by mouth daily.) 90 tablet 1   VOLTAREN 1 % GEL APPLY TOPICALLY 4 TIMES DAILY AS NEEDED (Patient taking differently: Apply 2 g topically daily as needed (Arthritis hip pain).) 100 g 0   No current facility-administered medications for this visit.    OBJECTIVE: African-American woman in no acute distress  Vitals:   10/16/23 1012  BP: (!) 113/44  Resp: 16  Temp: (!) 97.5 F (36.4 C)    Wt Readings from Last 3 Encounters:  10/16/23 134 lb 1.6 oz (60.8 kg)  10/08/23 137 lb (62.1 kg)  09/26/23 133 lb (60.3 kg)   Body mass index is 19.8 kg/m.    ECOG FS:1 - Symptomatic but completely ambulatory   Physical Exam Constitutional:      Appearance: Normal appearance.   Cardiovascular:     Rate and Rhythm: Normal rate and regular rhythm.  Pulmonary:     Effort: Pulmonary effort is normal.     Breath sounds: Normal breath sounds.  Abdominal:     General: Abdomen is flat.     Palpations: Abdomen is soft.   Musculoskeletal:        General: No swelling or tenderness.     Cervical back: Normal range of motion. No rigidity.  Lymphadenopathy:     Cervical: No cervical adenopathy.   Skin:    General: Skin is warm and dry.   Neurological:     Mental  Status: She is alert and oriented to person, place, and time.   Psychiatric:        Mood and Affect: Mood normal.       LAB RESULTS:  CMP     Component Value Date/Time   NA 139 09/18/2023 0939   NA 139 11/22/2020 0000   K 3.4 (L) 09/18/2023 0939   CL 107 09/18/2023 0939   CO2 26 09/18/2023 0939   GLUCOSE 127 (H) 09/18/2023 0939   BUN 17 09/18/2023 0939   CREATININE 0.78 09/18/2023 0939   CREATININE 1.05 (H) 05/19/2019 1034   CALCIUM  8.9 09/18/2023 0939   PROT 7.4 09/18/2023 0939   ALBUMIN  3.6 09/18/2023 0939   AST 21 09/18/2023 0939   ALT 16 09/18/2023 0939   ALKPHOS 65 09/18/2023 0939   BILITOT 0.3 09/18/2023 0939   GFRNONAA >60 09/18/2023 0939   GFRNONAA 48 (L) 01/23/2018 0804   GFRAA >60 06/23/2019 1524   GFRAA 56 (L) 01/23/2018 0804    No results found for: TOTALPROTELP, ALBUMINELP, A1GS, A2GS, BETS, BETA2SER, GAMS, MSPIKE, SPEI  No results found for: KPAFRELGTCHN, LAMBDASER, KAPLAMBRATIO  Lab Results  Component Value Date   WBC 2.7 (L) 10/16/2023   NEUTROABS PENDING 10/16/2023   HGB 8.7 (L) 10/16/2023   HCT 26.6 (L) 10/16/2023   MCV 70.7 (L) 10/16/2023   PLT 87 (L) 10/16/2023   No results found for: LABCA2  No components found for: [id number]  No results for input(s): INR in the last 168 hours.  No results found for: [facility]  Lab Results  Component Value Date   IHK742 41 (H) 07/26/2023    Lab Results  Component Value Date   VZD638 14.2 07/26/2023    No results found for: VFI433  Lab  Results  Component Value Date   CA2729 34.9 09/18/2023    No components found for: HGQUANT  Lab Results  Component Value Date   CEA1 3.4 05/09/2023   /  CEA  Date Value Ref Range Status  05/09/2023 3.4 0.0 - 4.7 ng/mL Final    Comment:    (NOTE)                             Nonsmokers          <3.9                             Smokers             <5.6 Roche [employer] Electrochemiluminescence  Immunoassay (ECLIA) Values obtained with different assay methods or kits cannot be used interchangeably.  Results cannot be interpreted as absolute evidence of the presence or absence of malignant disease. Performed At: [facility] [address] [place], [name] [id number] Pearlean Botts MD WU:[id number]      No results found for: AFPTUMOR  No results found for: [facility]  Lab Results  Component Value Date   HGBA 70.8 (L) 06/23/2019   HGBA2QUANT 3.6 (H) 06/23/2019   HGBFQUANT 0.0 06/23/2019   HGBSQUAN 25.6 (H) 06/23/2019   (Hemoglobinopathy evaluation)   Lab Results  Component Value Date   LDH 147 06/23/2019    Lab Results  Component Value Date   IRON 59 05/09/2023   TIBC 228 (L) 05/09/2023   IRONPCTSAT 26 05/09/2023   (Iron and TIBC)  Lab Results  Component Value Date   FERRITIN 263 05/09/2023    Urinalysis    Component Value Date/Time   COLORURINE YELLOW 05/09/2023 0225   APPEARANCEUR CLEAR 05/09/2023 0225   LABSPEC >1.046 (H) 05/09/2023 0225   PHURINE 5.0 05/09/2023 0225   GLUCOSEU NEGATIVE 05/09/2023 0225   HGBUR SMALL (A) 05/09/2023 0225   BILIRUBINUR NEGATIVE 05/09/2023 0225   KETONESUR NEGATIVE 05/09/2023 0225   PROTEINUR 100 (A) 05/09/2023 0225   UROBILINOGEN 0.2 03/24/2011 1054   NITRITE NEGATIVE 05/09/2023 0225   LEUKOCYTESUR NEGATIVE 05/09/2023 0225    STUDIES:  CT Chest Wo Contrast Result Date: 10/12/2023 EXAM: CT CHEST WITHOUT CONTRAST 10/04/2023 03:17:02 PM TECHNIQUE: CT of the chest was performed without the administration of intravenous contrast. Multiplanar reformatted images are provided for review. Automated exposure control, iterative reconstruction, and/or weight based adjustment of the mA/kV was utilized to reduce the radiation dose to as low as reasonably achievable. COMPARISON: Comparison CTA chest dated 05/09/2023. CLINICAL HISTORY: Metastatic breast cancer. Reason for exam: Metastatic breast cancer, Malignant  neoplasm of lower-inner quadrant of right breast of female, estrogen receptor positive (HCC) FINDINGS: MEDIASTINUM: Right chest port terminates at the cavoatrial junction. Thoracic aortic atherosclerosis with stable outpouching of the transverse aorta (image 61). Moderate 3-vessel coronary atherosclerosis. LYMPH NODES: No thoracic lymphadenopathy. LUNGS AND PLEURA: Moderate centrilobular and paraseptal emphysematous changes, upper lung predominant. Stable scarring in the posterior right middle and lower lobes. No suspicious pulmonary nodules. Stable treatment nodularity in the lateral right upper lobe (for example, image 68), suggesting mucus plugging within terminal bronchioles and/or chronic nodular scarring. No pleural effusion or pneumothorax. SOFT TISSUES/BONES: Status post right breast lumpectomy with overlying skin thickening, suggesting radiation changes. No acute abnormality of the bones or soft tissues. UPPER ABDOMEN: Limited images of the upper abdomen demonstrates no acute abnormality. IMPRESSION: 1. Status post right breast  lumpectomy with suspected radiation changes. 2. No metastatic disease in the chest. 3. Additional stable ancillary findings, as above. Electronically signed by: Zadie Herter MD 10/12/2023 08:42 PM EDT RP Workstation: [id number]   MR TOTAL SPINE METS SCREENING Result Date: 10/11/2023 CLINICAL DATA:  Metastatic lobular breast cancer.  ER positive. EXAM: MRI TOTAL SPINE WITHOUT AND WITH CONTRAST TECHNIQUE: Multisequence MR imaging of the spine from the cervical spine to the sacrum was performed prior to and following IV contrast administration for evaluation of spinal metastatic disease. CONTRAST:  6mL GADAVIST  GADOBUTROL  1 MMOL/ML IV SOLN COMPARISON:  None Available. FINDINGS: MRI CERVICAL SPINE FINDINGS Alignment: Grade 1 retrolisthesis at C3-4 measures 3 mm. Grade 1 anterolisthesis at C6-7 measures 3 mm. Slight anterolisthesis is present at C7-T1. Vertebrae: Vertebral body  heights are maintained. Chronic endplate degenerative changes are present at C3-4. Marrow signal and vertebral body heights are otherwise normal. Minimal enhancement is associated with the degenerative change at C3-4. No other pathologic enhancement or evidence for metastatic disease is present. Cord: Normal signal and morphology.  No pathologic enhancement. Posterior Fossa, vertebral arteries, paraspinal tissues: Visualized intracranial contents are within normal limits. The craniocervical junction is normal. Disc levels: Sagittal only images demonstrate bilateral foraminal narrowing at C3-4 and to lesser extent at C6-7. MRI THORACIC SPINE FINDINGS Alignment: Slight exaggerated thoracic kyphosis is present. No significant listhesis is present. Vertebrae: Marrow signal and vertebral body heights are normal. No pathologic enhancement is present. Twelve rib-bearing thoracic type vertebral bodies are present. Cord: Normal signal and morphology. No pathologic enhancement is present. Paraspinal and other soft tissues: Negative. Disc levels: No significant thoracic disc disease or central canal stenosis is present. Facet hypertrophy contribute to foraminal narrowing bilaterally at T10-11 and T11-L1. MRI LUMBAR SPINE FINDINGS Segmentation: 5 non rib-bearing lumbar type vertebral bodies are present. The lowest fully formed vertebral body is L5. Alignment: Grade 1 degenerative anterolisthesis is present at L3-4 and L4-5. Vertebrae: Chronic type 2 Modic changes are present at L5-S1. Marrow signal and vertebral body heights are otherwise normal. No pathologic enhancement is present. Conus medullaris: Extends to the L1-2 level and appears normal. Paraspinal and other soft tissues: Negative Disc levels: Facet hypertrophy contributes to mild right foraminal narrowing at L2-3. Mild disc bulging and facet hypertrophy is present bilaterally at L3-4 and L4-5. A chronic broad-based disc protrusion and bilateral facet hypertrophy leads  to mild central and bilateral foraminal stenosis. IMPRESSION: 1. No evidence for metastatic disease in the cervical, thoracic, or lumbar spine. 2. Bilateral foraminal narrowing at C3-4 and to lesser extent at C6-7. 3. Variant anatomy with only 11 rib-bearing thoracic type vertebral bodies. 4. Facet hypertrophy contribute to foraminal narrowing bilaterally at T10-11 and T11-L1. 5. Mild right foraminal narrowing at L2-3. 6. Mild central and bilateral foraminal stenosis at L3-4 and L4-5. Electronically Signed   By: Audree Leas M.D.   On: 10/11/2023 05:59     ELIGIBLE FOR AVAILABLE RESEARCH PROTOCOL: no   ASSESSMENT: 78 y.o. [name], [name] woman status post right breast lower inner quadrant biopsy 03/18/2018 for a clinical T2N0, [name], grade 2 or 3, estrogen and progesterone receptor positive, [name], with an MIB-1 of 5%  (1) Genetic testing performed through [facility] Common Hereditary Cancers Panel + Thyroid  Cancer Panel on 05/09/2018 showing no deleterious mutations APC, [name], [name], [name], [name], [name], [name], [name], [name], [name], [name], [name], [name], [name], [name], [name], [name], [name], [name], [name], [name], KIT, MEN1, [name], [name], [name], [name], [name],  [name], [name], [name], [name], [name], [name], [name], [name], POLE, [name], [name], RAD50, [name], [name], [name], [name], [name], [name], [name], [name], [name], [name], [name], [name], [name], [name], [name], VHL.   (a) Two variants of uncertain significance in the genes [name] c.1176C>T (Silent) and [name] c.1419+4C>T (Intronic) were identified.  (2) right lumpectomy and sentinel lymph node sampling 05/07/2018 showed a pT2 pN1, [name], grade 2, with a positive inferior margin  (a) reexcision scheduled for 06/05/2018  (3) MammaPrint obtained from the definitive surgical sample read as low risk predicting a disease-free survival at 5 years of 97.8% without need of  chemotherapy.  (4) adjuvant radiation 07/11/2018 - 08/27/2018  (a) 1. Right breast; [name]; [name]; 6 fractions of 2 Gy for a total of 12 Gy  (5) anastrozole  started 10/02/2018  (a) bone density 05/05/2021, [name], normal imits       (6) genetic testing 05/18/2018 through the Common Hereditary Cancers Panel + Thyroid  Caner panel showed no deleterious mutations in APC, [name], [name], [name], [name], [name], [name], [name], [name], [name], [name], [name], [name], [name], [name], [name], [name], [name], [name], [name], [name], KIT, MEN1, [name], [name], [name], [name], [name], [name], [name], [name], [name], [name], [name], [name], [name], POLE, [name], [name], RAD50, [name], [name], [name], [name], [name], [name], [name], [name], [name], [name], [name], [name], [name], [name], [name], VHL.  (a) 2 variants of uncertain significance in the genes [name] c.1176C>T (Silent) and [name] c.1419+4C>T (Intronic) were identified.    (b) UPDATE: The [name] c.1419+4C>T (Intronic) VUS has been reclassified to Likely Benign. The report date is 08/08/2018.  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 Treatment with Verzenio  and letrozole  planned for disease control.  Plan to continue Verzenio  for 2-3 months before reassessment with imaging.  - CT chest with no metastatic disease, bone scan negative for metastatic disease. -  unfortunately her disease was not easily evident on imaging. Tumor markers not very revealing - Monitor Verzenio  tolerability and adjust treatment as needed. - We will consider PET for reimaging - We will request foundation one on surgical  pathology. Email has been sent to pathology on 6/17  Diarrhea due to Verzenio  Diarrhea 2-3 times daily, managed with Imodium. Symptoms tolerable. - Use Imodium as needed for diarrhea.  Stomach pain due to Verzenio  Stomach pain with evening Verzenio  dose, managed with Tylenol  or tramadol . Tramadol  more effective. Symptoms may be exacerbated by acidic foods. - Use Tylenol  or tramadol  for stomach pain as needed. - Maintain a food diary to identify and avoid trigger foods, especially in the evening.  Fatigue due to Verzenio  Increased fatigue since starting Verzenio , considered tolerable.  Weight loss Weight decreased to 134 lbs from 147 lbs. Encouraged to gain weight using lactose-free nutritional supplements. - Encourage use of lactose-free nutritional supplements such as Boost or Ensure to aid in weight gain.  Total time spent: 30 minutes.  *Total Encounter Time as defined by the Centers  for Medicare and Medicaid Services includes, in addition to the face-to-face time of a patient visit (documented in the note above) non-face-to-face time: obtaining and reviewing outside history, ordering and reviewing medications, tests or procedures, care coordination (communications with other health care professionals or caregivers) and documentation in the medical record.

## 2023-10-17 ENCOUNTER — Other Ambulatory Visit: Payer: Self-pay | Admitting: Pharmacy Technician

## 2023-10-17 ENCOUNTER — Other Ambulatory Visit: Payer: Self-pay

## 2023-10-17 NOTE — Progress Notes (Signed)
 Specialty Pharmacy Refill Coordination Note  Catherine Lowery is a 78 y.o. female contacted today regarding refills of specialty medication(s) Abemaciclib  (VERZENIO )   Patient requested Delivery   Delivery date: 10/19/23   Verified address: [address] [place], [name] [address]   Medication will be filled on 10/18/23.

## 2023-10-17 NOTE — Progress Notes (Signed)
 Specialty Pharmacy Ongoing Clinical Assessment Note  Catherine Lowery is a 78 y.o. female who is being followed by the specialty pharmacy service for RxSp Oncology   Patient's specialty medication(s) reviewed today: Abemaciclib  (VERZENIO )   Missed doses in the last 4 weeks: 0   Patient/Caregiver did not have any additional questions or concerns.   Therapeutic benefit summary: Unable to assess   Adverse events/side effects summary: Experienced adverse events/side effects (Diarrhea resolved with Immodium and nausea if she does not eat with dose, but she normally does eat.)   Patient's therapy is appropriate to: Continue    Goals Addressed             This Visit's Progress    Maintain optimal adherence to therapy   On track    Patient is on track. Patient will maintain adherence          Follow up: 3 months  Ibrahim Mcpheeters M Therman Hughlett Specialty Pharmacist

## 2023-10-29 ENCOUNTER — Encounter (HOSPITAL_COMMUNITY): Payer: Self-pay | Admitting: Hematology and Oncology

## 2023-11-09 ENCOUNTER — Inpatient Hospital Stay: Attending: Psychiatry | Admitting: Licensed Clinical Social Worker

## 2023-11-09 DIAGNOSIS — Z808 Family history of malignant neoplasm of other organs or systems: Secondary | ICD-10-CM | POA: Insufficient documentation

## 2023-11-09 DIAGNOSIS — Z79899 Other long term (current) drug therapy: Secondary | ICD-10-CM | POA: Insufficient documentation

## 2023-11-09 DIAGNOSIS — Z8042 Family history of malignant neoplasm of prostate: Secondary | ICD-10-CM | POA: Insufficient documentation

## 2023-11-09 DIAGNOSIS — Z801 Family history of malignant neoplasm of trachea, bronchus and lung: Secondary | ICD-10-CM | POA: Insufficient documentation

## 2023-11-09 DIAGNOSIS — Z807 Family history of other malignant neoplasms of lymphoid, hematopoietic and related tissues: Secondary | ICD-10-CM | POA: Insufficient documentation

## 2023-11-09 DIAGNOSIS — C50311 Malignant neoplasm of lower-inner quadrant of right female breast: Secondary | ICD-10-CM | POA: Insufficient documentation

## 2023-11-09 DIAGNOSIS — Z8041 Family history of malignant neoplasm of ovary: Secondary | ICD-10-CM | POA: Insufficient documentation

## 2023-11-09 DIAGNOSIS — Z1732 Human epidermal growth factor receptor 2 negative status: Secondary | ICD-10-CM | POA: Insufficient documentation

## 2023-11-09 DIAGNOSIS — Z803 Family history of malignant neoplasm of breast: Secondary | ICD-10-CM | POA: Insufficient documentation

## 2023-11-09 DIAGNOSIS — Z79811 Long term (current) use of aromatase inhibitors: Secondary | ICD-10-CM | POA: Insufficient documentation

## 2023-11-09 DIAGNOSIS — Z170 Estrogen receptor positive status [ER+]: Secondary | ICD-10-CM | POA: Insufficient documentation

## 2023-11-09 DIAGNOSIS — Z1721 Progesterone receptor positive status: Secondary | ICD-10-CM | POA: Insufficient documentation

## 2023-11-09 NOTE — Progress Notes (Signed)
 [facility] Clinical Social Work  Clinical Social Work was referred by Engineer, civil (consulting) for need for [employer].  Clinical Social Worker contacted patient by phone to offer support and assess for needs.    Patient has metastatic breast cancer and was treated for ovarian cancer in 2025. She was working prior to ovarian cancer dx. 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 She is current on all bills at this time.  Does not receive any SNAP/energy/housing assistance. Has [employer] Medicare. Lives alone. Is able to complete ADLs and her sister is a support for her.   Interventions: Provided patient with information about cancer foundations for potential financial assistance  Provided information about [employer] financial assistance and how to call billing      Follow Up Plan:  CSW will see patient on 11/21/2023 to receive supporting documents from patient and complete applications for [employer] and [employer]. Will review [employer] application and determine if pt would like to complete it.    Jaiden Wahab E Pepper Wyndham, LCSW  Clinical Social Worker [facility]

## 2023-11-12 ENCOUNTER — Other Ambulatory Visit: Payer: Self-pay

## 2023-11-14 ENCOUNTER — Other Ambulatory Visit (HOSPITAL_COMMUNITY): Payer: Self-pay

## 2023-11-14 ENCOUNTER — Other Ambulatory Visit: Payer: Self-pay

## 2023-11-14 NOTE — Progress Notes (Signed)
 Specialty Pharmacy Refill Coordination Note  Spoke with Dezerae Freiberger is a 78 y.o. female contacted today regarding refills of specialty medication(s) Abemaciclib  (VERZENIO )  Doses on hand: 7  Patient requested: Delivery   Delivery date: 11/16/23   Verified address: [address]  [place] [address]  Medication will be filled on 11/15/23.

## 2023-11-20 ENCOUNTER — Telehealth (HOSPITAL_COMMUNITY): Payer: Self-pay

## 2023-11-21 ENCOUNTER — Inpatient Hospital Stay

## 2023-11-21 ENCOUNTER — Inpatient Hospital Stay: Admitting: Hematology and Oncology

## 2023-11-21 ENCOUNTER — Inpatient Hospital Stay: Admitting: Licensed Clinical Social Worker

## 2023-11-21 VITALS — BP 127/48 | HR 59 | Temp 97.6°F | Resp 16 | Ht 69.0 in | Wt 139.1 lb

## 2023-11-21 DIAGNOSIS — C50311 Malignant neoplasm of lower-inner quadrant of right female breast: Secondary | ICD-10-CM | POA: Diagnosis present

## 2023-11-21 DIAGNOSIS — Z801 Family history of malignant neoplasm of trachea, bronchus and lung: Secondary | ICD-10-CM | POA: Diagnosis not present

## 2023-11-21 DIAGNOSIS — Z808 Family history of malignant neoplasm of other organs or systems: Secondary | ICD-10-CM | POA: Diagnosis not present

## 2023-11-21 DIAGNOSIS — Z79899 Other long term (current) drug therapy: Secondary | ICD-10-CM | POA: Diagnosis not present

## 2023-11-21 DIAGNOSIS — Z1721 Progesterone receptor positive status: Secondary | ICD-10-CM | POA: Diagnosis not present

## 2023-11-21 DIAGNOSIS — C799 Secondary malignant neoplasm of unspecified site: Secondary | ICD-10-CM

## 2023-11-21 DIAGNOSIS — Z807 Family history of other malignant neoplasms of lymphoid, hematopoietic and related tissues: Secondary | ICD-10-CM | POA: Diagnosis not present

## 2023-11-21 DIAGNOSIS — Z170 Estrogen receptor positive status [ER+]: Secondary | ICD-10-CM | POA: Diagnosis not present

## 2023-11-21 DIAGNOSIS — Z79811 Long term (current) use of aromatase inhibitors: Secondary | ICD-10-CM | POA: Diagnosis not present

## 2023-11-21 DIAGNOSIS — Z8042 Family history of malignant neoplasm of prostate: Secondary | ICD-10-CM | POA: Diagnosis not present

## 2023-11-21 DIAGNOSIS — Z8041 Family history of malignant neoplasm of ovary: Secondary | ICD-10-CM | POA: Diagnosis not present

## 2023-11-21 DIAGNOSIS — Z803 Family history of malignant neoplasm of breast: Secondary | ICD-10-CM | POA: Diagnosis not present

## 2023-11-21 DIAGNOSIS — C562 Malignant neoplasm of left ovary: Secondary | ICD-10-CM

## 2023-11-21 DIAGNOSIS — Z1732 Human epidermal growth factor receptor 2 negative status: Secondary | ICD-10-CM | POA: Diagnosis not present

## 2023-11-21 DIAGNOSIS — D61818 Other pancytopenia: Secondary | ICD-10-CM

## 2023-11-21 LAB — CBC WITH DIFFERENTIAL/PLATELET
Abs Immature Granulocytes: 0.01 K/uL (ref 0.00–0.07)
Basophils Absolute: 0 K/uL (ref 0.0–0.1)
Basophils Relative: 1 %
Eosinophils Absolute: 0.1 K/uL (ref 0.0–0.5)
Eosinophils Relative: 3 %
HCT: 26.6 % — ABNORMAL LOW (ref 36.0–46.0)
Hemoglobin: 8.6 g/dL — ABNORMAL LOW (ref 12.0–15.0)
Immature Granulocytes: 0 %
Lymphocytes Relative: 31 %
Lymphs Abs: 0.9 K/uL (ref 0.7–4.0)
MCH: 23.5 pg — ABNORMAL LOW (ref 26.0–34.0)
MCHC: 32.3 g/dL (ref 30.0–36.0)
MCV: 72.7 fL — ABNORMAL LOW (ref 80.0–100.0)
Monocytes Absolute: 0.4 K/uL (ref 0.1–1.0)
Monocytes Relative: 12 %
Neutro Abs: 1.6 K/uL — ABNORMAL LOW (ref 1.7–7.7)
Neutrophils Relative %: 53 %
Platelets: 126 K/uL — ABNORMAL LOW (ref 150–400)
RBC: 3.66 MIL/uL — ABNORMAL LOW (ref 3.87–5.11)
RDW: 19.5 % — ABNORMAL HIGH (ref 11.5–15.5)
WBC: 2.9 K/uL — ABNORMAL LOW (ref 4.0–10.5)
nRBC: 0 % (ref 0.0–0.2)

## 2023-11-21 LAB — SAMPLE TO BLOOD BANK

## 2023-11-21 LAB — CMP (CANCER CENTER ONLY)
ALT: 33 U/L (ref 0–44)
AST: 27 U/L (ref 15–41)
Albumin: 3.4 g/dL — ABNORMAL LOW (ref 3.5–5.0)
Alkaline Phosphatase: 82 U/L (ref 38–126)
Anion gap: 5 (ref 5–15)
BUN: 16 mg/dL (ref 8–23)
CO2: 26 mmol/L (ref 22–32)
Calcium: 9 mg/dL (ref 8.9–10.3)
Chloride: 110 mmol/L (ref 98–111)
Creatinine: 1.2 mg/dL — ABNORMAL HIGH (ref 0.44–1.00)
GFR, Estimated: 46 mL/min — ABNORMAL LOW (ref >60)
Glucose, Bld: 59 mg/dL — ABNORMAL LOW (ref 70–99)
Potassium: 4 mmol/L (ref 3.5–5.1)
Sodium: 141 mmol/L (ref 135–145)
Total Bilirubin: 0.3 mg/dL (ref 0.0–1.2)
Total Protein: 7.4 g/dL (ref 6.5–8.1)

## 2023-11-21 LAB — TSH: TSH: 1.12 u[IU]/mL (ref 0.350–4.500)

## 2023-11-21 NOTE — Progress Notes (Signed)
 [facility]  [phone] [phone]     ID: Catherine Lowery DOB: May 16, 1945  MR#: [id number]  RDW#:[id number]  Patient Care Team: [name], NP as PCP - General (Nurse Practitioner) Mona Vinie BROCKS, MD as PCP - Cardiology (Cardiology) Shannon Agent, MD as Consulting Physician (Radiation Oncology) Beverley Evalene BIRCH, MD as Attending Physician (Orthopedic Surgery) Margaret Eduard SAUNDERS, MD as Consulting Physician (Neurology) Nicholaus Sherlean CROME, [facility] (Inactive) as Pharmacist (Pharmacist) OTHER MD:   CHIEF COMPLAINT: Metastatic breast cancer  CURRENT TREATMENT: None  INTERVAL HISTORY: Discussed the use of AI scribe software for clinical note transcription with the patient, who gave verbal consent to proceed.  History of Present Illness Catherine Lowery is a 78 year old female with metastatic lobular breast cancer who presents for follow-up.  She experiences intermittent diarrhea, particularly after consuming certain fruits like watermelon and cantaloupe. The diarrhea occurs about three times a day and is managed with Imodium, which she takes only when she needs to go out. She took one Imodium pill this morning after experiencing diarrhea at 1 AM and has not had another bowel movement since.  Her current medication regimen includes two cancer medications, one of which is taken twice a day and the other once a day. She confirms adherence to her medication schedule, taking the pills as prescribed.  No new pain, changes in urinary habits, headaches, or double vision. She reports having normal bowel movements aside from the episodes of diarrhea. She is able to finish meals without early satiety and is maintaining her weight at 139 pounds.   COVID 19 VACCINATION STATUS: Pfizer x4, last 10/2020   Breast cancer History  Catherine Lowery had routine screening mammography on 02/26/2018 showing a possible abnormality in the right breast. She underwent  unilateral right diagnostic mammography with tomography and right breast ultrasonography at [facility] on 03/07/2018 showing: Breast Density Category B. On mammography, there is a new 1.5 cm irregular architectural distortion with a spiculated margin in the right breast at 5 o'clock anterior depth. On ultrasound, there is a 2.7 cm irregular solid mass in the right breast at 5 o'clock middle depth. This irregular mass displays posterior acoustic shadowing. No significant abnormalities were seen sonographically in the right axilla.  Accordingly on 03/18/2018 she proceeded to biopsy of the right breast area in question. The pathology from this procedure showed ([id number]): Invasive mammary carcinoma e-cadherin negative, grade II-III. 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 Prognostic indicators significant for: estrogen receptor, 95% positive and progesterone receptor, 95% positive, both with strong staining intensity. Proliferation marker Ki67 at 5%. HER2 negative by immunohistochemistry (1+).  On 04/10/2018 the patient underwent bilateral breast MRI with and without contrast showing a 2.3 cm spiculated enhancing mass associated with non-masslike enhancement, the total abnormal area measuring 4.6 cm.  There was no evidence of lymph node abnormality and the contralateral breast was benign appearing  The patient's subsequent history is as detailed below.   PAST MEDICAL HISTORY: Past Medical History:  Diagnosis Date   Cancer (HCC) 03/2018   right breast cancer    ovarian   Chronic kidney disease    CKD   COPD (chronic obstructive pulmonary disease) (HCC)    DJD (degenerative joint disease)    LEFT HIP   Family history of breast cancer    Family history of ovarian cancer    Family history of prostate cancer    Family history of prostate cancer    H/O migraine  07/01/2019   Hyperlipidemia    takes Simvasatin daily   Hypertension    takes Propranolol  and HCTZ daioly    Hyperthyroidism    Pneumonia    x2   PONV (postoperative nausea and vomiting)    when ether used   Reactive airway disease 05/08/2023   Tremors of nervous system    takes Primidone  daily    PAST SURGICAL HISTORY: Past Surgical History:  Procedure Laterality Date   BOne Spur     left heel   BOWEL RESECTION N/A 08/21/2023   Procedure: SMALL BOWEL RESECTION WITH SIDE-TO-SIDE, FUNCTIONAL END-TO-END ANASTOMOSIS;  Surgeon: Eldonna Mays, MD;  Location: [facility];  Service: Gynecology;  Laterality: N/A;   BREAST LUMPECTOMY WITH RADIOACTIVE SEED AND SENTINEL LYMPH NODE BIOPSY Right 05/07/2018   Procedure: RIGHT BREAST LUMPECTOMY WITH RADIOACTIVE SEED AND RIGHT  SENTINEL LYMPH NODE MAPPING;  Surgeon: Vanderbilt Ned, MD;  Location: [facility];  Service: General;  Laterality: Right;   COLONOSCOPY     IR IMAGING GUIDED PORT INSERTION  05/10/2023   IR US  GUIDE BX ASP/DRAIN  05/10/2023   JOINT REPLACEMENT Bilateral    knee   KNEE SURGERY Left    lateral muscle release in [employer]   OMENTECTOMY N/A 08/21/2023   Procedure: OMENTECTOMY;  Surgeon: Eldonna Mays, MD;  Location: [facility];  Service: Gynecology;  Laterality: N/A;  via robot approach   RE-EXCISION OF BREAST LUMPECTOMY Right 06/05/2018   Procedure: RE-EXCISION OF RIGHT BREAST LUMPECTOMY;  Surgeon: Vanderbilt Ned, MD;  Location: [facility];  Service: General;  Laterality: Right;   ROBOTIC ASSISTED TOTAL HYSTERECTOMY WITH BILATERAL SALPINGO OOPHERECTOMY N/A 08/21/2023   Procedure: ROBOTIC ASSISTED TOTAL LAPAROSCOPY HYSTERECTOMY WITH BILATERAL SALPINGO-OOPHORECTOMY, RADICAL DISSECTION FOR DEBULKING;  Surgeon: Eldonna Mays, MD;  Location: [facility];  Service: Gynecology;  Laterality: N/A;   ROTATOR CUFF REPAIR Bilateral    TOTAL HIP ARTHROPLASTY Left 12/29/2015   Procedure: TOTAL HIP ARTHROPLASTY ANTERIOR APPROACH;  Surgeon: Toribio JULIANNA Chancy, MD;  Location: [facility];  Service: Orthopedics;  Laterality: Left;    FAMILY  HISTORY Family History  Problem Relation Age of Onset   Hypertension Mother    Heart disease Mother    Stroke Mother    Multiple myeloma Father 5   Thyroid  cancer Sister        dx 71's/50's   Cancer Paternal Aunt        unknown   Prostate cancer Paternal Uncle        dx >50- cancer was the cause of his death   Ovarian cancer Paternal Aunt    Throat cancer Paternal Uncle    Breast cancer Cousin        dx >50   Cancer Cousin        type unk   Cancer Cousin        type unk   Cancer Cousin        type unk  She notes that her father died from a multiple myeloma at age 30. Patients' mother died from stroke at age 85. The patient has 1 brother and 2 sisters. One paternal aunt had ovarian cancer.  One cousin had breast cancer, and her 53s. Another paternal aunt had cancer, type unknown to the patient, and a paternal uncle had prostate cancer.    GYNECOLOGIC HISTORY:  Menarche: 78 years old Age at first live birth: 78 years old GX P: 1 LMP: Patient's last menstrual period was 01/24/2000. 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 Before that, she has been employed at [employer], [employer], and as an elderly caregiver. 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 She is separated, and has not spoken to her husband, Earlie Schultze, in over 10 years. She lives with her only son, Maley Venezia, who is disabled secondary to SCD and multiple surgeries. She has no pets. She attends [employer].    ADVANCED DIRECTIVES: Her sister, Velia Muskrat, is Cody's medical power of attorney. Velia can be reached at [phone]. Birchard has stated that her husband, Earlie Schultze, is not to have access to her medical records or to make decisions on her behalf. ]   HEALTH MAINTENANCE: Social  History   Tobacco Use   Smoking status: Former    Current packs/day: 0.75    Average packs/day: 0.8 packs/day for 28.0 years (21.0 ttl pk-yrs)    Types: Cigarettes    Passive exposure: Never   Smokeless tobacco: Never   Tobacco comments:    02/13/22 Pt stated I'm trying to quit Archie JONELLE Che, CMA     Quit February 2025  Vaping Use   Vaping status: Never Used  Substance Use Topics   Alcohol use: No    Alcohol/week: 0.0 standard drinks of alcohol   Drug use: No     Colonoscopy: 01/2015  PAP:   Bone density:  02/26/2018: T-score 1.4   Allergies  Allergen Reactions   Morphine And Codeine Itching and Nausea And Vomiting   Percocet [Oxycodone -Acetaminophen ] Itching and Nausea And Vomiting    Current Outpatient Medications  Medication Sig Dispense Refill   abemaciclib  (VERZENIO ) 100 MG tablet Take 1 tablet (100 mg total) by mouth 2 (two) times daily. 56 tablet 2   albuterol  (VENTOLIN  HFA) 108 (90 Base) MCG/ACT inhaler Inhale 2 puffs into the lungs every 4 (four) hours as needed for wheezing or shortness of breath.     amLODipine  (NORVASC ) 10 MG tablet Take 1 tablet (10 mg total) by mouth daily. 90 tablet 1   aspirin  (ASPIRIN  ADULT) 325 MG tablet enteric coated; may use OTC 30 tablet 0   atorvastatin  (LIPITOR) 40 MG tablet Take 1 tablet (40 mg total) by mouth daily. 90 tablet 1   letrozole  (FEMARA ) 2.5 MG tablet Take 1 tablet (2.5 mg total) by mouth daily. 90 tablet 3   levothyroxine  (SYNTHROID ) 50 MCG tablet Take 1 tablet (50 mcg total) by mouth daily. 90 tablet 3   lidocaine -prilocaine  (EMLA ) cream Apply to affected area once (Patient taking differently: Apply 1 Application topically daily as needed [facility] for chemo).) 30 g 3   losartan  (COZAAR ) 25 MG tablet Take 25 mg by mouth daily.     Multiple Vitamin (MULTIVITAMIN WITH MINERALS) TABS tablet Take 1 tablet by mouth daily.     ondansetron  (ZOFRAN ) 8 MG tablet Take 1 tablet (8 mg total) by mouth every 8 (eight) hours as  needed for nausea or vomiting. Start on the third day after carboplatin . 30 tablet 1   POTASSIUM PO Take 1 tablet by mouth daily.     primidone  (MYSOLINE ) 50 MG tablet Take 2 tablets (100 mg total) by mouth every morning. 180 tablet 2   prochlorperazine  (COMPAZINE ) 10 MG tablet Take 1 tablet (10 mg total) by mouth every 6 (six) hours as needed  for nausea or vomiting. 30 tablet 1   propranolol  (INDERAL ) 40 MG tablet Take 0.5 tablets (20 mg total) by mouth 2 (two) times daily. (Patient taking differently: Take 20 mg by mouth daily.) 90 tablet 1   VOLTAREN 1 % GEL APPLY TOPICALLY 4 TIMES DAILY AS NEEDED (Patient taking differently: Apply 2 g topically daily as needed (Arthritis hip pain).) 100 g 0   No current facility-administered medications for this visit.    OBJECTIVE: African-American woman in no acute distress  There were no vitals filed for this visit.   Wt Readings from Last 3 Encounters:  10/16/23 134 lb 1.6 oz (60.8 kg)  10/08/23 137 lb (62.1 kg)  09/26/23 133 lb (60.3 kg)   There is no height or weight on file to calculate BMI.    ECOG FS:1 - Symptomatic but completely ambulatory   Physical Exam Constitutional:      Appearance: Normal appearance.  Cardiovascular:     Rate and Rhythm: Normal rate and regular rhythm.  Pulmonary:     Effort: Pulmonary effort is normal.     Breath sounds: Normal breath sounds.  Abdominal:     General: Abdomen is flat.     Palpations: Abdomen is soft.  Musculoskeletal:        General: No swelling or tenderness.     Cervical back: Normal range of motion. No rigidity.  Lymphadenopathy:     Cervical: No cervical adenopathy.  Skin:    General: Skin is warm and dry.  Neurological:     Mental Status: She is alert and oriented to person, place, and time.  Psychiatric:        Mood and Affect: Mood normal.       LAB RESULTS:  CMP     Component Value Date/Time   NA 139 10/16/2023 0947   NA 139 11/22/2020 0000   K 4.2 10/16/2023  0947   CL 110 10/16/2023 0947   CO2 22 10/16/2023 0947   GLUCOSE 91 10/16/2023 0947   BUN 20 10/16/2023 0947   CREATININE 1.15 (H) 10/16/2023 0947   CREATININE 1.05 (H) 05/19/2019 1034   CALCIUM  9.3 10/16/2023 0947   PROT 7.9 10/16/2023 0947   ALBUMIN  3.7 10/16/2023 0947   AST 28 10/16/2023 0947   ALT 29 10/16/2023 0947   ALKPHOS 79 10/16/2023 0947   BILITOT 0.4 10/16/2023 0947   GFRNONAA 48 (L) 10/16/2023 0947   GFRNONAA 48 (L) 01/23/2018 0804   GFRAA >60 06/23/2019 1524   GFRAA 56 (L) 01/23/2018 0804    No results found for: TOTALPROTELP, ALBUMINELP, A1GS, A2GS, BETS, BETA2SER, GAMS, MSPIKE, SPEI  No results found for: KPAFRELGTCHN, LAMBDASER, KAPLAMBRATIO  Lab Results  Component Value Date   WBC 2.7 (L) 10/16/2023   NEUTROABS 1.1 (L) 10/16/2023   HGB 8.7 (L) 10/16/2023   HCT 26.6 (L) 10/16/2023   MCV 70.7 (L) 10/16/2023   PLT 87 (L) 10/16/2023   No results found for: LABCA2  No components found for: OJARJW874  No results for input(s): INR in the last 168 hours.  No results found for: [facility]  Lab Results  Component Value Date   RJW800 15 (H) 07/26/2023    Lab Results  Component Value Date   CAN125 14.2 07/26/2023    No results found for: RJW846  Lab Results  Component Value Date   CA2729 34.9 09/18/2023    No components found for: HGQUANT  Lab Results  Component Value Date   CEA1 3.4 05/09/2023   /  CEA  Date Value Ref Range Status  05/09/2023 3.4 0.0 - 4.7 ng/mL Final    Comment:    (NOTE)                             Nonsmokers          <3.9                             Smokers             <5.6 Roche Diagnostics Electrochemiluminescence Immunoassay (ECLIA) Values obtained with different assay methods or kits cannot be used interchangeably.  Results cannot be interpreted as absolute evidence of the presence or absence of malignant disease. Performed At: [facility] [address] [place],  [name] [id number] Jennette Shorter MD Ey:[id number]      No results found for: AFPTUMOR  No results found for: [facility]  Lab Results  Component Value Date   HGBA 70.8 (L) 06/23/2019   HGBA2QUANT 3.6 (H) 06/23/2019   HGBFQUANT 0.0 06/23/2019   HGBSQUAN 25.6 (H) 06/23/2019   (Hemoglobinopathy evaluation)   Lab Results  Component Value Date   LDH 147 06/23/2019    Lab Results  Component Value Date   IRON 59 05/09/2023   TIBC 228 (L) 05/09/2023   IRONPCTSAT 26 05/09/2023   (Iron and TIBC)  Lab Results  Component Value Date   FERRITIN 263 05/09/2023    Urinalysis    Component Value Date/Time   COLORURINE YELLOW 05/09/2023 0225   APPEARANCEUR CLEAR 05/09/2023 0225   LABSPEC >1.046 (H) 05/09/2023 0225   PHURINE 5.0 05/09/2023 0225   GLUCOSEU NEGATIVE 05/09/2023 0225   HGBUR SMALL (A) 05/09/2023 0225   BILIRUBINUR NEGATIVE 05/09/2023 0225   KETONESUR NEGATIVE 05/09/2023 0225   PROTEINUR 100 (A) 05/09/2023 0225   UROBILINOGEN 0.2 03/24/2011 1054   NITRITE NEGATIVE 05/09/2023 0225   LEUKOCYTESUR NEGATIVE 05/09/2023 0225    STUDIES:  No results found.    ELIGIBLE FOR AVAILABLE RESEARCH PROTOCOL: no   ASSESSMENT: 78 y.o. [name], [name] woman status post right breast lower inner quadrant biopsy 03/18/2018 for a clinical T2N0, [name], grade 2 or 3, estrogen and progesterone receptor positive, [name], with an MIB-1 of 5%  (1) Genetic testing performed through [facility] Common Hereditary Cancers Panel + Thyroid  Cancer Panel on 05/09/2018 showing no deleterious mutations APC, [name], [name], [name], [name], [name], [name], [name], [name], [name], [name], [name], [name], [name], [name], [name], [name], [name], [name], [name], [name], KIT, MEN1, [name], [name], [name], [name], [name], [name], [name], [name], [name], [name], [name], [name], [name], POLE, [name], [name], RAD50, [name], [name], [name], [name], [name], [name], [name], [name], [name],  [name], [name], [name], [name], [name], [name], VHL.   (a) Two variants of uncertain significance in the genes [name] c.1176C>T (Silent) and [name] c.1419+4C>T (Intronic) were identified.  (2) right lumpectomy and sentinel lymph node sampling 05/07/2018 showed a pT2 pN1, [name], grade 2, with a positive inferior margin  (a) reexcision scheduled for 06/05/2018  (3) MammaPrint obtained from the definitive surgical sample read as low risk predicting a disease-free survival at 5 years of 97.8% without need of chemotherapy.  (4) adjuvant radiation 07/11/2018 - 08/27/2018  (a) 1. Right breast; 28 fractions of 1.8 Gy for a total of 50.4 Gy  2. Right axilla; [name]; 6 fractions of 2 Gy for a total of 12 Gy  (5) anastrozole  started 10/02/2018  (a) bone density 05/05/2021, [name], normal imits       (6) genetic testing 05/18/2018 through the Common Hereditary Cancers Panel + Thyroid  Caner panel showed no deleterious mutations in APC, [name], [name], [name], [name], [name], [name], [name], [name], [name], [name], [name], [name], [name], [name], [name], [name], [name], [name], [name], [name], KIT, MEN1, [name], [name], [name], [name], [name], [name], [name], [name], [name], [name], [name], [name], [name], POLE, [name], [name], RAD50, [name], [name], [name], [name], [name], [name], [name], [name], [name], [name], [name], [name], [name], [name], [name], VHL.  (a) 2 variants of uncertain significance in the genes [name] c.1176C>T (Silent) and [name] c.1419+4C>T (Intronic) were identified.    (b) UPDATE: The [name] c.1419+4C>T (Intronic) VUS has been reclassified to Likely Benign. The report date is 08/08/2018.  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 Treatment with Verzenio  and letrozole  planned for disease control.  Plan to continue Verzenio  for 2-3 months before reassessment with imaging.  - CT chest with no metastatic disease, bone scan negative for metastatic disease. -  unfortunately her disease was not easily evident on imaging. Tumor markers not very revealing - Monitor Verzenio  tolerability and adjust treatment as needed. - We will consider PET for reimaging - Foundation one with PIK3CA, can use it for second line treatment.  Diarrhea Intermittent diarrhea likely due to cancer medication, not severe enough to stop treatment. - Advise to avoid fruits that trigger diarrhea. - Continue Imodium as needed.  Mild anemia Mild anemia likely due to bone marrow suppression from cancer medication. Hemoglobin slightly low, no transfusion needed.  Total time spent: 30 minutes.  *Total Encounter Time as defined by the Centers for Medicare and Medicaid Services includes, in addition to the face-to-face time of a patient visit (documented in the note above) non-face-to-face time: obtaining and reviewing outside history, ordering and reviewing medications, tests or procedures, care coordination (communications with other health care professionals or caregivers) and documentation in the medical record.

## 2023-11-21 NOTE — Progress Notes (Signed)
 [facility] CSW Progress Note  Visual merchandiser met with patient and sisters Barnie and Velia to follow-up on financial concerns.    Interventions: Worked on application to [employer] in [place] Discussed documents needed for [employer] application & provided [employer] application      Follow Up Plan:  Patient/ family will bring in supporting documents for applications (proof of income, non medical bill) for CSW to complete applications    [name] E Esma Kilts, LCSW Clinical Social Worker [facility]

## 2023-11-26 ENCOUNTER — Telehealth: Payer: Self-pay

## 2023-11-26 NOTE — Telephone Encounter (Signed)
 [facility] CSW Progress Note  Patient's sister L. Milton left voicemail on this date for primary CSW inquiring about email address to send documents for grant. Covering CSW returned call and provided patient's sister with primary CSW's telephone and direct email address. Patient's sister made aware that primary CSW is out of office and return date. Patient's sister encouraged to call if any needs arise.      Follow Up Plan:  No follow up scheduled. Patient will send primary CSW requested documents.     Lizbeth Sprague, LCSW Clinical Social Worker [facility]

## 2023-12-04 ENCOUNTER — Encounter: Payer: Self-pay | Admitting: Licensed Clinical Social Worker

## 2023-12-04 NOTE — Progress Notes (Signed)
 [facility] CSW Progress Note  Clinical Child psychotherapist received documents from pt's sister for financial applications.    Interventions: Submitted application to [employer] in [place] on pt's behalf      Follow Up Plan:  [employer] will contact pt directly regarding assistance    Satoria Dunlop E Debe Anfinson, LCSW Clinical Social Worker [facility]

## 2023-12-06 ENCOUNTER — Encounter (HOSPITAL_COMMUNITY)
Admission: RE | Admit: 2023-12-06 | Discharge: 2023-12-06 | Disposition: A | Discharge status: Home / Self Care | Source: Ambulatory Visit | Attending: Hematology and Oncology | Admitting: Hematology and Oncology

## 2023-12-06 ENCOUNTER — Other Ambulatory Visit: Payer: Self-pay | Admitting: Hematology and Oncology

## 2023-12-06 ENCOUNTER — Other Ambulatory Visit (HOSPITAL_COMMUNITY): Payer: Self-pay

## 2023-12-06 ENCOUNTER — Other Ambulatory Visit: Payer: Self-pay

## 2023-12-06 DIAGNOSIS — C799 Secondary malignant neoplasm of unspecified site: Secondary | ICD-10-CM | POA: Insufficient documentation

## 2023-12-06 LAB — GLUCOSE, CAPILLARY: Glucose-Capillary: 89 mg/dL (ref 70–99)

## 2023-12-06 MED ORDER — FLUDEOXYGLUCOSE F - 18 (FDG) INJECTION
6.9900 | Freq: Once | INTRAVENOUS | Status: AC
  Administered 2023-12-06: 6.99 via INTRAVENOUS

## 2023-12-06 MED ORDER — ABEMACICLIB 100 MG PO TABS
100.0000 mg | ORAL_TABLET | Freq: Two times a day (BID) | ORAL | 2 refills | Status: DC
  Filled 2023-12-06 – 2023-12-10 (×2): qty 56, 28d supply, fill #0
  Filled 2024-01-02: qty 56, 28d supply, fill #1
  Filled 2024-01-31: qty 56, 28d supply, fill #2

## 2023-12-10 ENCOUNTER — Other Ambulatory Visit: Payer: Self-pay | Admitting: Pharmacy Technician

## 2023-12-10 ENCOUNTER — Other Ambulatory Visit: Payer: Self-pay

## 2023-12-10 NOTE — Progress Notes (Signed)
 Specialty Pharmacy Refill Coordination Note  Catherine Lowery is a 78 y.o. female contacted today regarding refills of specialty medication(s) Abemaciclib  (VERZENIO )   Patient requested Delivery   Delivery date: 12/13/23   Verified address: [address]  [place]   Medication will be filled on 12/12/23.

## 2023-12-11 ENCOUNTER — Telehealth: Payer: Self-pay | Admitting: Licensed Clinical Social Worker

## 2023-12-11 ENCOUNTER — Other Ambulatory Visit: Payer: Self-pay

## 2023-12-11 NOTE — Telephone Encounter (Signed)
 TC from pt's sister, Catherine Lowery, who is helping with applications for assistance. Unfortunately, pt does not have a bill that meets criteria for [employer] at this time. Pt/sister will update me when she has one.  Discussed [employer] application again and they will determine if they can gather the necessary work documents.    Catherine Komatsu E Valma Rotenberg, LCSW

## 2023-12-20 ENCOUNTER — Inpatient Hospital Stay: Attending: Psychiatry

## 2023-12-20 ENCOUNTER — Encounter: Payer: Self-pay | Admitting: Hematology and Oncology

## 2023-12-20 ENCOUNTER — Inpatient Hospital Stay: Admitting: Hematology and Oncology

## 2023-12-20 VITALS — BP 114/62 | HR 57 | Temp 98.0°F | Wt 140.3 lb

## 2023-12-20 DIAGNOSIS — Z79899 Other long term (current) drug therapy: Secondary | ICD-10-CM | POA: Insufficient documentation

## 2023-12-20 DIAGNOSIS — Z803 Family history of malignant neoplasm of breast: Secondary | ICD-10-CM | POA: Diagnosis not present

## 2023-12-20 DIAGNOSIS — Z79890 Hormone replacement therapy (postmenopausal): Secondary | ICD-10-CM | POA: Insufficient documentation

## 2023-12-20 DIAGNOSIS — Z170 Estrogen receptor positive status [ER+]: Secondary | ICD-10-CM | POA: Insufficient documentation

## 2023-12-20 DIAGNOSIS — Z79811 Long term (current) use of aromatase inhibitors: Secondary | ICD-10-CM | POA: Insufficient documentation

## 2023-12-20 DIAGNOSIS — Z1721 Progesterone receptor positive status: Secondary | ICD-10-CM | POA: Insufficient documentation

## 2023-12-20 DIAGNOSIS — Z808 Family history of malignant neoplasm of other organs or systems: Secondary | ICD-10-CM | POA: Diagnosis not present

## 2023-12-20 DIAGNOSIS — Z1732 Human epidermal growth factor receptor 2 negative status: Secondary | ICD-10-CM | POA: Insufficient documentation

## 2023-12-20 DIAGNOSIS — Z87891 Personal history of nicotine dependence: Secondary | ICD-10-CM | POA: Diagnosis not present

## 2023-12-20 DIAGNOSIS — C799 Secondary malignant neoplasm of unspecified site: Secondary | ICD-10-CM

## 2023-12-20 DIAGNOSIS — Z8041 Family history of malignant neoplasm of ovary: Secondary | ICD-10-CM | POA: Insufficient documentation

## 2023-12-20 DIAGNOSIS — C50311 Malignant neoplasm of lower-inner quadrant of right female breast: Secondary | ICD-10-CM | POA: Insufficient documentation

## 2023-12-20 DIAGNOSIS — D61818 Other pancytopenia: Secondary | ICD-10-CM

## 2023-12-20 DIAGNOSIS — Z801 Family history of malignant neoplasm of trachea, bronchus and lung: Secondary | ICD-10-CM | POA: Diagnosis not present

## 2023-12-20 DIAGNOSIS — C562 Malignant neoplasm of left ovary: Secondary | ICD-10-CM

## 2023-12-20 LAB — CBC WITH DIFFERENTIAL/PLATELET
Abs Immature Granulocytes: 0.01 K/uL (ref 0.00–0.07)
Basophils Absolute: 0.1 K/uL (ref 0.0–0.1)
Basophils Relative: 2 %
Eosinophils Absolute: 0.1 K/uL (ref 0.0–0.5)
Eosinophils Relative: 3 %
HCT: 28.2 % — ABNORMAL LOW (ref 36.0–46.0)
Hemoglobin: 9.2 g/dL — ABNORMAL LOW (ref 12.0–15.0)
Immature Granulocytes: 0 %
Lymphocytes Relative: 29 %
Lymphs Abs: 1 K/uL (ref 0.7–4.0)
MCH: 24.3 pg — ABNORMAL LOW (ref 26.0–34.0)
MCHC: 32.6 g/dL (ref 30.0–36.0)
MCV: 74.4 fL — ABNORMAL LOW (ref 80.0–100.0)
Monocytes Absolute: 0.3 K/uL (ref 0.1–1.0)
Monocytes Relative: 8 %
Neutro Abs: 2 K/uL (ref 1.7–7.7)
Neutrophils Relative %: 58 %
Platelets: 146 K/uL — ABNORMAL LOW (ref 150–400)
RBC: 3.79 MIL/uL — ABNORMAL LOW (ref 3.87–5.11)
RDW: 18 % — ABNORMAL HIGH (ref 11.5–15.5)
WBC: 3.3 K/uL — ABNORMAL LOW (ref 4.0–10.5)
nRBC: 0 % (ref 0.0–0.2)

## 2023-12-20 LAB — CMP (CANCER CENTER ONLY)
ALT: 32 U/L (ref 0–44)
AST: 33 U/L (ref 15–41)
Albumin: 3.5 g/dL (ref 3.5–5.0)
Alkaline Phosphatase: 88 U/L (ref 38–126)
Anion gap: 5 (ref 5–15)
BUN: 17 mg/dL (ref 8–23)
CO2: 28 mmol/L (ref 22–32)
Calcium: 9.3 mg/dL (ref 8.9–10.3)
Chloride: 107 mmol/L (ref 98–111)
Creatinine: 1.27 mg/dL — ABNORMAL HIGH (ref 0.44–1.00)
GFR, Estimated: 43 mL/min — ABNORMAL LOW (ref >60)
Glucose, Bld: 85 mg/dL (ref 70–99)
Potassium: 4.4 mmol/L (ref 3.5–5.1)
Sodium: 140 mmol/L (ref 135–145)
Total Bilirubin: 0.3 mg/dL (ref 0.0–1.2)
Total Protein: 7.5 g/dL (ref 6.5–8.1)

## 2023-12-20 LAB — SAMPLE TO BLOOD BANK

## 2023-12-20 LAB — TSH: TSH: 1.99 u[IU]/mL (ref 0.350–4.500)

## 2023-12-20 NOTE — Progress Notes (Signed)
 [facility]  [phone] Fax:([phone]     ID: Catherine Lowery DOB: 05/05/1945  MR#: [id number]  RDW#:[id number]  Patient Care Team: [name], NP as PCP - General (Nurse Practitioner) Mona Vinie BROCKS, MD as PCP - Cardiology (Cardiology) Shannon Agent, MD as Consulting Physician (Radiation Oncology) Beverley Evalene BIRCH, MD as Attending Physician (Orthopedic Surgery) Margaret Eduard SAUNDERS, MD as Consulting Physician (Neurology) Nicholaus Sherlean CROME, [facility] (Inactive) as Pharmacist (Pharmacist) OTHER MD:   CHIEF COMPLAINT: Metastatic breast cancer  CURRENT TREATMENT: None  INTERVAL HISTORY: Discussed the use of AI scribe software for clinical note transcription with the patient, who gave verbal consent to proceed.  History of Present Illness  Catherine Lowery is a 78 year old female with a history of cancer who presents for follow-up regarding her cancer treatment and recent imaging results.  She has been taking her medications consistently and has not experienced any new symptoms since her last visit. She continues to experience diarrhea, which she attributes to her medication, Verzenio . She manages this side effect with Imodium as needed, particularly when planning to go out. The diarrhea does not interfere with her daily activities.  Her hemoglobin levels are currently 9.2, with previous values of 7.8 in April and 8.7 subsequently.  A recent scan on August 7th revealed haziness in the lungs. She has a history of smoking and acknowledges periodic coughing, especially when something is in her throat, but states it has not worsened. No shortness of breath, fever, or recent upper respiratory infections.  She reports a good appetite, stating she eats throughout the day and enjoys snacks like potato chips. She also consumes fruit, mentioning a banana she had with her medication. She expresses a liking for hot dogs, which she recently purchased.   COVID 19  VACCINATION STATUS: Pfizer x4, last 10/2020   Breast cancer History  Catherine Lowery had routine screening mammography on 02/26/2018 showing a possible abnormality in the right breast. She underwent unilateral right diagnostic mammography with tomography and right breast ultrasonography at [facility] on 03/07/2018 showing: Breast Density Category B. On mammography, there is a new 1.5 cm irregular architectural distortion with a spiculated margin in the right breast at 5 o'clock anterior depth. On ultrasound, there is a 2.7 cm irregular solid mass in the right breast at 5 o'clock middle depth. This irregular mass displays posterior acoustic shadowing. No significant abnormalities were seen sonographically in the right axilla.  Accordingly on 03/18/2018 she proceeded to biopsy of the right breast area in question. The pathology from this procedure showed ([id number]): Invasive mammary carcinoma e-cadherin negative, grade II-III. 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 Prognostic indicators significant for: estrogen receptor, 95% positive and progesterone receptor, 95% positive, both with strong staining intensity. Proliferation marker Ki67 at 5%. HER2 negative by immunohistochemistry (1+).  On 04/10/2018 the patient underwent bilateral breast MRI with and without contrast showing a 2.3 cm spiculated enhancing mass associated with non-masslike enhancement, the total abnormal area measuring 4.6 cm.  There was no evidence of lymph node abnormality and the contralateral breast was benign appearing  The patient's subsequent history is as detailed below.   PAST MEDICAL HISTORY: Past Medical History:  Diagnosis Date   Cancer (HCC) 03/2018   right breast cancer    ovarian   Chronic kidney disease    CKD   COPD (chronic obstructive pulmonary disease) (HCC)    DJD (degenerative joint disease)    LEFT HIP   Family  history of breast cancer    Family history of ovarian cancer     Family history of prostate cancer    Family history of prostate cancer    H/O migraine 07/01/2019   Hyperlipidemia    takes Simvasatin daily   Hypertension    takes Propranolol  and HCTZ daioly   Hyperthyroidism    Pneumonia    x2   PONV (postoperative nausea and vomiting)    when ether used   Reactive airway disease 05/08/2023   Tremors of nervous system    takes Primidone  daily    PAST SURGICAL HISTORY: Past Surgical History:  Procedure Laterality Date   BOne Spur     left heel   BOWEL RESECTION N/A 08/21/2023   Procedure: SMALL BOWEL RESECTION WITH SIDE-TO-SIDE, FUNCTIONAL END-TO-END ANASTOMOSIS;  Surgeon: Eldonna Mays, MD;  Location: [facility];  Service: Gynecology;  Laterality: N/A;   BREAST LUMPECTOMY WITH RADIOACTIVE SEED AND SENTINEL LYMPH NODE BIOPSY Right 05/07/2018   Procedure: RIGHT BREAST LUMPECTOMY WITH RADIOACTIVE SEED AND RIGHT  SENTINEL LYMPH NODE MAPPING;  Surgeon: Vanderbilt Ned, MD;  Location: [facility];  Service: General;  Laterality: Right;   COLONOSCOPY     IR IMAGING GUIDED PORT INSERTION  05/10/2023   IR US  GUIDE BX ASP/DRAIN  05/10/2023   JOINT REPLACEMENT Bilateral    knee   KNEE SURGERY Left    lateral muscle release in [employer]   OMENTECTOMY N/A 08/21/2023   Procedure: OMENTECTOMY;  Surgeon: Eldonna Mays, MD;  Location: [facility];  Service: Gynecology;  Laterality: N/A;  via robot approach   RE-EXCISION OF BREAST LUMPECTOMY Right 06/05/2018   Procedure: RE-EXCISION OF RIGHT BREAST LUMPECTOMY;  Surgeon: Vanderbilt Ned, MD;  Location:  SURGERY CENTER;  Service: General;  Laterality: Right;   ROBOTIC ASSISTED TOTAL HYSTERECTOMY WITH BILATERAL SALPINGO OOPHERECTOMY N/A 08/21/2023   Procedure: ROBOTIC ASSISTED TOTAL LAPAROSCOPY HYSTERECTOMY WITH BILATERAL SALPINGO-OOPHORECTOMY, RADICAL DISSECTION FOR DEBULKING;  Surgeon: Eldonna Mays, MD;  Location: [facility];  Service: Gynecology;  Laterality: N/A;   ROTATOR CUFF REPAIR Bilateral     TOTAL HIP ARTHROPLASTY Left 12/29/2015   Procedure: TOTAL HIP ARTHROPLASTY ANTERIOR APPROACH;  Surgeon: Toribio JULIANNA Chancy, MD;  Location: [facility];  Service: Orthopedics;  Laterality: Left;    FAMILY HISTORY Family History  Problem Relation Age of Onset   Hypertension Mother    Heart disease Mother    Stroke Mother    Multiple myeloma Father 84   Thyroid  cancer Sister        dx 20's/50's   Cancer Paternal Aunt        unknown   Prostate cancer Paternal Uncle        dx >50- cancer was the cause of his death   Ovarian cancer Paternal Aunt    Throat cancer Paternal Uncle    Breast cancer Cousin        dx >50   Cancer Cousin        type unk   Cancer Cousin        type unk   Cancer Cousin        type unk  She notes that her father died from a multiple myeloma at age 20. Patients' mother died from stroke at age 43. The patient has 1 brother and 2 sisters. One paternal aunt had ovarian cancer.  One cousin had breast cancer, and her 39s. Another paternal aunt had cancer, type unknown to the patient, and a paternal uncle had prostate cancer.  GYNECOLOGIC HISTORY:  Menarche: 78 years old Age at first live birth: 78 years old GX P: 1 LMP: Patient's last menstrual period was 01/24/2000. 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 Before that, she has been employed at [employer], [employer], and as an elderly caregiver. 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 She is separated, and has not spoken to her husband, Earlie Schultze, in over 10 years. She lives with her only son, Catherine Lowery, who is disabled secondary to SCD and multiple surgeries. She has no pets. She attends [employer].    ADVANCED DIRECTIVES: Her sister, Velia Muskrat, is Lonnette's medical power of attorney. Velia  can be reached at [phone]. [place] has stated that her husband, Earlie Schultze, is not to have access to her medical records or to make decisions on her behalf. ]   HEALTH MAINTENANCE: Social History   Tobacco Use   Smoking status: Former    Current packs/day: 0.75    Average packs/day: 0.8 packs/day for 28.0 years (21.0 ttl pk-yrs)    Types: Cigarettes    Passive exposure: Never   Smokeless tobacco: Never   Tobacco comments:    02/13/22 Pt stated I'm trying to quit Catherine Lowery, Catherine Lowery     Quit February 2025  Vaping Use   Vaping status: Never Used  Substance Use Topics   Alcohol use: No    Alcohol/week: 0.0 standard drinks of alcohol   Drug use: No     Colonoscopy: 01/2015  PAP:   Bone density:  02/26/2018: T-score 1.4   Allergies  Allergen Reactions   Morphine And Codeine Itching and Nausea And Vomiting   Percocet [Oxycodone -Acetaminophen ] Itching and Nausea And Vomiting    Current Outpatient Medications  Medication Sig Dispense Refill   abemaciclib  (VERZENIO ) 100 MG tablet Take 1 tablet (100 mg total) by mouth 2 (two) times daily. 56 tablet 2   albuterol  (VENTOLIN  HFA) 108 (90 Base) MCG/ACT inhaler Inhale 2 puffs into the lungs every 4 (four) hours as needed for wheezing or shortness of breath.     amLODipine  (NORVASC ) 10 MG tablet Take 1 tablet (10 mg total) by mouth daily. 90 tablet 1   aspirin  (ASPIRIN  ADULT) 325 MG tablet enteric coated; may use OTC 30 tablet 0   atorvastatin  (LIPITOR) 40 MG tablet Take 1 tablet (40 mg total) by mouth daily. 90 tablet 1   letrozole  (FEMARA ) 2.5 MG tablet Take 1 tablet (2.5 mg total) by mouth daily. 90 tablet 3   levothyroxine  (SYNTHROID ) 50 MCG tablet Take 1 tablet (50 mcg total) by mouth daily. 90 tablet 3   lidocaine -prilocaine  (EMLA ) cream Apply to affected area once 30 g 3   losartan  (COZAAR ) 25 MG tablet Take 25 mg by mouth daily.     Multiple Vitamin (MULTIVITAMIN WITH MINERALS) TABS tablet Take 1 tablet by mouth  daily.     ondansetron  (ZOFRAN ) 8 MG tablet Take 1 tablet (8 mg total) by mouth every 8 (eight) hours as needed for nausea or vomiting. Start on the third day after carboplatin . 30 tablet 1   POTASSIUM PO Take 1 tablet by mouth daily.     primidone  (MYSOLINE ) 50 MG tablet Take 2 tablets (100 mg total) by mouth every morning. 180 tablet 2   prochlorperazine  (  COMPAZINE ) 10 MG tablet Take 1 tablet (10 mg total) by mouth every 6 (six) hours as needed for nausea or vomiting. 30 tablet 1   propranolol  (INDERAL ) 40 MG tablet Take 0.5 tablets (20 mg total) by mouth 2 (two) times daily. (Patient taking differently: Take 20 mg by mouth daily.) 90 tablet 1   VOLTAREN 1 % GEL APPLY TOPICALLY 4 TIMES DAILY AS NEEDED (Patient taking differently: Apply 2 g topically daily as needed (Arthritis hip pain).) 100 g 0   No current facility-administered medications for this visit.    OBJECTIVE: African-American woman in no acute distress  Vitals:   12/20/23 0840  BP: 114/62  Pulse: (!) 57  Temp: 98 F (36.7 C)  SpO2: 98%     Wt Readings from Last 3 Encounters:  12/20/23 140 lb 4.8 oz (63.6 kg)  11/21/23 139 lb 1.6 oz (63.1 kg)  10/16/23 134 lb 1.6 oz (60.8 kg)   Body mass index is 20.72 kg/m.    ECOG FS:1 - Symptomatic but completely ambulatory   Physical Exam Constitutional:      Appearance: Normal appearance.  Cardiovascular:     Rate and Rhythm: Normal rate and regular rhythm.  Pulmonary:     Effort: Pulmonary effort is normal.     Breath sounds: Normal breath sounds.  Abdominal:     General: Abdomen is flat.     Palpations: Abdomen is soft.  Musculoskeletal:        General: No swelling or tenderness.     Cervical back: Normal range of motion. No rigidity.  Lymphadenopathy:     Cervical: No cervical adenopathy.  Skin:    General: Skin is warm and dry.  Neurological:     Mental Status: She is alert and oriented to person, place, and time.  Psychiatric:        Mood and Affect: Mood  normal.       LAB RESULTS:  CMP     Component Value Date/Time   NA 140 12/20/2023 0805   NA 139 11/22/2020 0000   K 4.4 12/20/2023 0805   CL 107 12/20/2023 0805   CO2 28 12/20/2023 0805   GLUCOSE 85 12/20/2023 0805   BUN 17 12/20/2023 0805   CREATININE 1.27 (H) 12/20/2023 0805   CREATININE 1.05 (H) 05/19/2019 1034   CALCIUM  9.3 12/20/2023 0805   PROT 7.5 12/20/2023 0805   ALBUMIN  3.5 12/20/2023 0805   AST 33 12/20/2023 0805   ALT 32 12/20/2023 0805   ALKPHOS 88 12/20/2023 0805   BILITOT 0.3 12/20/2023 0805   GFRNONAA 43 (L) 12/20/2023 0805   GFRNONAA 48 (L) 01/23/2018 0804   GFRAA >60 06/23/2019 1524   GFRAA 56 (L) 01/23/2018 0804    No results found for: TOTALPROTELP, ALBUMINELP, A1GS, A2GS, BETS, BETA2SER, GAMS, MSPIKE, SPEI  No results found for: KPAFRELGTCHN, LAMBDASER, KAPLAMBRATIO  Lab Results  Component Value Date   WBC 3.3 (L) 12/20/2023   NEUTROABS 2.0 12/20/2023   HGB 9.2 (L) 12/20/2023   HCT 28.2 (L) 12/20/2023   MCV 74.4 (L) 12/20/2023   PLT 146 (L) 12/20/2023   No results found for: LABCA2  No components found for: OJARJW874  No results for input(s): INR in the last 168 hours.  No results found for: [facility]  Lab Results  Component Value Date   RJW800 41 (H) 07/26/2023    Lab Results  Component Value Date   RJW874 14.2 07/26/2023    No results found for: RJW846  Lab Results  Component Value Date   CA2729 34.9 09/18/2023    No components found for: HGQUANT  Lab Results  Component Value Date   CEA1 3.4 05/09/2023   /  CEA  Date Value Ref Range Status  05/09/2023 3.4 0.0 - 4.7 ng/mL Final    Comment:    (NOTE)                             Nonsmokers          <3.9                             Smokers             <5.6 Roche [employer]ostics Electrochemiluminescence Immunoassay (ECLIA) Values obtained with different assay methods or kits cannot be used interchangeably.  Results cannot  be interpreted as absolute evidence of the presence or absence of malignant disease. Performed At: [facility] [address] [place], [name] [id number] Jennette Shorter MD Ey:[id number]      No results found for: AFPTUMOR  No results found for: [facility]  Lab Results  Component Value Date   HGBA 70.8 (L) 06/23/2019   HGBA2QUANT 3.6 (H) 06/23/2019   HGBFQUANT 0.0 06/23/2019   HGBSQUAN 25.6 (H) 06/23/2019   (Hemoglobinopathy evaluation)   Lab Results  Component Value Date   LDH 147 06/23/2019    Lab Results  Component Value Date   IRON 59 05/09/2023   TIBC 228 (L) 05/09/2023   IRONPCTSAT 26 05/09/2023   (Iron and TIBC)  Lab Results  Component Value Date   FERRITIN 263 05/09/2023    Urinalysis    Component Value Date/Time   COLORURINE YELLOW 05/09/2023 0225   APPEARANCEUR CLEAR 05/09/2023 0225   LABSPEC >1.046 (H) 05/09/2023 0225   PHURINE 5.0 05/09/2023 0225   GLUCOSEU NEGATIVE 05/09/2023 0225   HGBUR SMALL (A) 05/09/2023 0225   BILIRUBINUR NEGATIVE 05/09/2023 0225   KETONESUR NEGATIVE 05/09/2023 0225   PROTEINUR 100 (A) 05/09/2023 0225   UROBILINOGEN 0.2 03/24/2011 1054   NITRITE NEGATIVE 05/09/2023 0225   LEUKOCYTESUR NEGATIVE 05/09/2023 0225    STUDIES:  NM PET Image Initial (PI) Skull Base To Thigh Result Date: 12/15/2023 CLINICAL DATA:  Initial treatment strategy for metastatic breast cancer. EXAM: NUCLEAR MEDICINE PET SKULL BASE TO THIGH TECHNIQUE: 6.99 mCi F-18 FDG was injected intravenously. Full-ring PET imaging was performed from the skull base to thigh after the radiotracer. CT data was obtained and used for attenuation correction and anatomic localization. Fasting blood glucose: 89 mg/dl COMPARISON:  Chest CT 93/94/7974 and CT abdomen 08/26/2023 FINDINGS: Mediastinal blood pool activity: SUV max 2.3 Liver activity: SUV max NA NECK: No hypermetabolic lymph nodes in the neck. Incidental CT findings: Advanced bilateral carotid artery  calcifications. CHEST: Numerous patchy E irregular nodular densities in both lungs demonstrating FDG uptake. These are new since the prior recent chest CT from 10/04/2023 and likely represent an inflammatory or infectious process. Somewhat rounded nodular density in the right upper lobe on image 67/4 has an SUV max of 6.2 but even in retrospect I do not see any abnormality on the prior chest CT from 2 months ago. Small scattered pulmonary nodules are stable. No enlarged or hypermetabolic mediastinal hilar lymph nodes. There is a hypermetabolic focus noted in the left supraclavicular region which is most likely a thyroid  lesion. Difficult to see a discrete nodule or lesion on the CT  scan but recommend thyroid  ultrasound for further evaluation. Surgical changes involving the left breast. No hypermetabolic breast lesions or chest wall lesions. No axillary adenopathy. Incidental CT findings: Stable advanced vascular disease and stable significant underlying emphysematous lung changes and pulmonary scarring. ABDOMEN/PELVIS: 1 small focus of hypermetabolism adjacent to the right hepatic lobe posteriorly has an SUV max of 4.5. This could be a peritoneal implant but no obvious CT abnormality. 9 mm nodule adjacent to the IVC on image 130/4 is hypermetabolic with SUV max of 8.9. This is consistent with a neoplastic node or implant. I do not see any other hypermetabolic peritoneal omental lesions. Scattered bowel activity is typical. No findings for metastatic disease involving the liver, spleen, pancreas or adrenal glands. Incidental CT findings: Stable advanced vascular disease. Remote surgical changes involving the bowel. Bilateral renal cysts are stable. SKELETON: No findings suspicious for osseous metastatic disease. Incidental CT findings: Left total hip arthroplasty without complicating features. Severe right hip joint degenerative changes. Severe lumbar facet disease. IMPRESSION: 1. Numerous patchy irregular nodular  densities in both lungs demonstrating FDG uptake. These are new since the prior chest CT from 10/04/2023 and likely represent an inflammatory or infectious process. Recommend correlation with clinical findings and follow-up noncontrast chest CT in 3 months to reassess. 2. No enlarged or hypermetabolic mediastinal or hilar lymph nodes. 3. Small focus of hypermetabolism adjacent to the right hepatic lobe posteriorly suspicious for peritoneal implant. There is also a 9 mm hypermetabolic retroperitoneal nodule. 4. No findings for osseous metastatic disease. 5. Hypermetabolic focus in the left supraclavicular region is most likely a thyroid  lesion. Recommend follow-up thyroid  ultrasound. 6. Advanced vascular disease. Aortic Atherosclerosis (ICD10-I70.0). Electronically Signed   By: MYRTIS Stammer M.D.   On: 12/15/2023 13:28      ELIGIBLE FOR AVAILABLE RESEARCH PROTOCOL: no   ASSESSMENT: 78 y.o. [name], [name] woman status post right breast lower inner quadrant biopsy 03/18/2018 for a clinical T2N0, [name], grade 2 or 3, estrogen and progesterone receptor positive, [name], with an MIB-1 of 5%  (1) Genetic testing performed through [facility] Common Hereditary Cancers Panel + Thyroid  Cancer Panel on 05/09/2018 showing no deleterious mutations APC, [name], [name], [name], [name], [name], [name], [name], [name], [name], [name], [name], [name], [name], [name], [name], [name], [name], [name], [name], [name], KIT, MEN1, [name], [name], [name], [name], [name], [name], [name], [name], [name], [name], [name], [name], [name], POLE, [name], [name], RAD50, [name], [name], [name], [name], [name], [name], [name], [name], [name], [name], [name], [name], [name], [name], [name], VHL.   (a) Two variants of uncertain significance in the genes [name] c.1176C>T (Silent) and [name] c.1419+4C>T (Intronic) were identified.  (2) right lumpectomy and sentinel lymph node sampling 05/07/2018 showed a pT2 pN1, stage IIA  invasive lobular carcinoma, grade 2, with a positive inferior margin  (a) reexcision scheduled for 06/05/2018  (3) MammaPrint obtained from the definitive surgical sample read as low risk predicting a disease-free survival at 5 years of 97.8% without need of chemotherapy.  (4) adjuvant radiation 07/11/2018 - 08/27/2018  (a) 1. Right breast; [name]; [name]; 6 fractions of 2 Gy  for a total of 12 Gy  (5) anastrozole  started 10/02/2018  (a) bone density 05/05/2021, [name], normal imits       (6) genetic testing 05/18/2018 through the Common Hereditary Cancers Panel + Thyroid  Caner panel showed no deleterious mutations in APC, [name], [name], [name], [name], [name], [name], [name], [name], [name], [name], [name], [name], [name], [name], [name], [name], [name], [name], [name], [name], KIT, MEN1, [name], [name], [name], [name], [name], [name], [name], [name], [name], [name], [name], [name], [name], POLE, [name], [name], RAD50, [name], [name], [name], [name], [name], [name], [name], [name], [name], [name], [name], [name], [name], [name], [name], VHL.  (a) 2 variants of uncertain significance in the genes [name] c.1176C>T (Silent) and [name] c.1419+4C>T (Intronic) were identified.    (b) UPDATE: The [name] c.1419+4C>T (Intronic) VUS has been reclassified to Likely Benign. The report date is 08/08/2018.  (7) the patient has thalassemia/sickle cell trait: Hemoglobin electrophoresis 06/23/2019 shows hemoglobin 87.8%, [name], hemoglobin A2 elevated at 3.6%   PLAN: Assessment & Plan  Metastatic breast cancer involving retroperitoneum  Recent PET scan shows lung haziness, possibly healing infiltrate or ? pneumonitis, without symptoms.  Retroperitoneal nodule present.  - Continue current treatment regimen. - Monitor lung haziness and  cough.  Chemotherapy-induced diarrhea Diarrhea from Verzenio  present but not interfering with daily activities. Managed with Imodium. - Continue Imodium as needed for diarrhea management.  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 - Continue monitoring hemoglobin levels.  Chronic cough (smoker's cough) with stable symptoms Chronic cough attributed to smoker's cough. No new symptoms. PET scan shows lung haziness without def evidence of pneumonitis or acute issues. - Monitor cough for any changes. - Use spirometer or blow balloons daily to expand lung capacity. - If we have the slightest suspicion of pneumonitis, we will hold verzenio  and treat but right now, he is doing well.  Total time spent: 30 minutes.  *Total Encounter Time as defined by the Centers for Medicare and Medicaid Services includes, in addition to the face-to-face time of a patient visit (documented in the note above) non-face-to-face time: obtaining and reviewing outside history, ordering and reviewing medications, tests or procedures, care coordination (communications with other health care professionals or caregivers) and documentation in the medical record.

## 2024-01-02 ENCOUNTER — Other Ambulatory Visit: Payer: Self-pay

## 2024-01-02 NOTE — Progress Notes (Signed)
 Specialty Pharmacy Refill Coordination Note  Catherine Lowery is a 78 y.o. female contacted today regarding refills of specialty medication(s) Abemaciclib  (VERZENIO )   Patient requested Delivery   Delivery date: 01/11/24   Verified address: [address]  [place]   Medication will be filled on 01/10/24.

## 2024-01-03 ENCOUNTER — Other Ambulatory Visit: Payer: Self-pay

## 2024-01-03 NOTE — Progress Notes (Signed)
 Specialty Pharmacy Ongoing Clinical Assessment Note  Catherine Lowery is a 78 y.o. female who is being followed by the specialty pharmacy service for RxSp Oncology   Patient's specialty medication(s) reviewed today: Abemaciclib  (VERZENIO )   Missed doses in the last 4 weeks: 0   Patient/Caregiver did not have any additional questions or concerns.   Therapeutic benefit summary: Patient is achieving benefit   Adverse events/side effects summary: Experienced adverse events/side effects (continued diarrhea, patient takes Immodium as needed)   Patient's therapy is appropriate to: Continue    Goals Addressed             This Visit's Progress    Maintain optimal adherence to therapy   On track    Patient is on track. Patient will maintain adherence          Follow up: 3 months  Catherine Lowery Specialty Pharmacist

## 2024-01-07 ENCOUNTER — Encounter: Payer: Self-pay | Admitting: Licensed Clinical Social Worker

## 2024-01-07 NOTE — Progress Notes (Signed)
 [facility] Clinical Social Work  CSW received notice that pt was approved for assistance from [employer] in [place]. Pt notified directly by foundation about assistance and how to utilize it.    Yancey Pedley E Kayon Dozier, LCSW

## 2024-01-08 ENCOUNTER — Inpatient Hospital Stay: Attending: Psychiatry | Admitting: Licensed Clinical Social Worker

## 2024-01-08 DIAGNOSIS — Z79811 Long term (current) use of aromatase inhibitors: Secondary | ICD-10-CM | POA: Insufficient documentation

## 2024-01-08 DIAGNOSIS — Z170 Estrogen receptor positive status [ER+]: Secondary | ICD-10-CM | POA: Insufficient documentation

## 2024-01-08 DIAGNOSIS — Z87891 Personal history of nicotine dependence: Secondary | ICD-10-CM | POA: Insufficient documentation

## 2024-01-08 DIAGNOSIS — Z1732 Human epidermal growth factor receptor 2 negative status: Secondary | ICD-10-CM | POA: Insufficient documentation

## 2024-01-08 DIAGNOSIS — D696 Thrombocytopenia, unspecified: Secondary | ICD-10-CM | POA: Insufficient documentation

## 2024-01-08 DIAGNOSIS — Z1721 Progesterone receptor positive status: Secondary | ICD-10-CM | POA: Insufficient documentation

## 2024-01-08 DIAGNOSIS — D649 Anemia, unspecified: Secondary | ICD-10-CM | POA: Insufficient documentation

## 2024-01-08 DIAGNOSIS — C786 Secondary malignant neoplasm of retroperitoneum and peritoneum: Secondary | ICD-10-CM | POA: Insufficient documentation

## 2024-01-08 DIAGNOSIS — C50311 Malignant neoplasm of lower-inner quadrant of right female breast: Secondary | ICD-10-CM | POA: Insufficient documentation

## 2024-01-08 DIAGNOSIS — K529 Noninfective gastroenteritis and colitis, unspecified: Secondary | ICD-10-CM | POA: Insufficient documentation

## 2024-01-08 NOTE — Progress Notes (Signed)
 [facility] CSW Progress Note  Visual merchandiser received bill from pt's sister, Velia, for [employer] application.    Interventions: Referred patient to community resources: [employer] Evangelist- application completed today       Follow Up Plan:  Patient will contact CSW with any support or resource needs    Greer Wainright E Delynda Sepulveda, LCSW Clinical Social Worker [facility]

## 2024-01-10 ENCOUNTER — Other Ambulatory Visit: Payer: Self-pay

## 2024-01-11 ENCOUNTER — Telehealth: Payer: Self-pay

## 2024-01-11 DIAGNOSIS — C799 Secondary malignant neoplasm of unspecified site: Secondary | ICD-10-CM

## 2024-01-11 NOTE — Telephone Encounter (Signed)
 [id number]- Effectiveness of Out-of Pocket Cost Communication and Financial Navigation (CostCOM) in Cancer Patients:    Called via phone patient understands I will see pt next week during Thurs 8:45am appt with Dr. Loretha for pt to fill out study survey.  Laury Quale, MPH  Clinical Research Coordinator

## 2024-01-14 ENCOUNTER — Encounter: Payer: Self-pay | Admitting: Licensed Clinical Social Worker

## 2024-01-14 NOTE — Progress Notes (Signed)
 [facility] CSW Progress Note  Clinical Social Worker attempted to contact patient by phone to follow-up on [employer] application which was approved for assistance.    No answer. Left VM for patient that grant application was approved. Sent e-mail to sister, Velia, with the same information.     Follow Up Plan:  Patient will contact CSW with any support or resource needs    Shantia Sanford E Jernie Schutt, LCSW Clinical Social Worker [facility]

## 2024-01-17 ENCOUNTER — Inpatient Hospital Stay

## 2024-01-17 ENCOUNTER — Inpatient Hospital Stay (HOSPITAL_BASED_OUTPATIENT_CLINIC_OR_DEPARTMENT_OTHER): Admitting: Hematology and Oncology

## 2024-01-17 VITALS — BP 116/50 | HR 53 | Temp 97.9°F | Resp 14 | Wt 143.9 lb

## 2024-01-17 DIAGNOSIS — C786 Secondary malignant neoplasm of retroperitoneum and peritoneum: Secondary | ICD-10-CM | POA: Diagnosis not present

## 2024-01-17 DIAGNOSIS — D649 Anemia, unspecified: Secondary | ICD-10-CM | POA: Diagnosis not present

## 2024-01-17 DIAGNOSIS — C799 Secondary malignant neoplasm of unspecified site: Secondary | ICD-10-CM | POA: Diagnosis not present

## 2024-01-17 DIAGNOSIS — Z87891 Personal history of nicotine dependence: Secondary | ICD-10-CM | POA: Diagnosis not present

## 2024-01-17 DIAGNOSIS — Z1732 Human epidermal growth factor receptor 2 negative status: Secondary | ICD-10-CM | POA: Diagnosis not present

## 2024-01-17 DIAGNOSIS — D696 Thrombocytopenia, unspecified: Secondary | ICD-10-CM | POA: Diagnosis not present

## 2024-01-17 DIAGNOSIS — C50311 Malignant neoplasm of lower-inner quadrant of right female breast: Secondary | ICD-10-CM | POA: Diagnosis present

## 2024-01-17 DIAGNOSIS — C562 Malignant neoplasm of left ovary: Secondary | ICD-10-CM

## 2024-01-17 DIAGNOSIS — Z79811 Long term (current) use of aromatase inhibitors: Secondary | ICD-10-CM | POA: Diagnosis not present

## 2024-01-17 DIAGNOSIS — Z170 Estrogen receptor positive status [ER+]: Secondary | ICD-10-CM | POA: Diagnosis not present

## 2024-01-17 DIAGNOSIS — D61818 Other pancytopenia: Secondary | ICD-10-CM

## 2024-01-17 DIAGNOSIS — Z1721 Progesterone receptor positive status: Secondary | ICD-10-CM | POA: Diagnosis not present

## 2024-01-17 DIAGNOSIS — K529 Noninfective gastroenteritis and colitis, unspecified: Secondary | ICD-10-CM | POA: Diagnosis not present

## 2024-01-17 LAB — CBC WITH DIFFERENTIAL/PLATELET
Abs Immature Granulocytes: 0 K/uL (ref 0.00–0.07)
Basophils Absolute: 0.1 K/uL (ref 0.0–0.1)
Basophils Relative: 2 %
Eosinophils Absolute: 0.1 K/uL (ref 0.0–0.5)
Eosinophils Relative: 2 %
HCT: 25.2 % — ABNORMAL LOW (ref 36.0–46.0)
Hemoglobin: 8.5 g/dL — ABNORMAL LOW (ref 12.0–15.0)
Immature Granulocytes: 0 %
Lymphocytes Relative: 30 %
Lymphs Abs: 1 K/uL (ref 0.7–4.0)
MCH: 25.1 pg — ABNORMAL LOW (ref 26.0–34.0)
MCHC: 33.7 g/dL (ref 30.0–36.0)
MCV: 74.3 fL — ABNORMAL LOW (ref 80.0–100.0)
Monocytes Absolute: 0.4 K/uL (ref 0.1–1.0)
Monocytes Relative: 11 %
Neutro Abs: 1.8 K/uL (ref 1.7–7.7)
Neutrophils Relative %: 55 %
Platelets: 103 K/uL — ABNORMAL LOW (ref 150–400)
RBC: 3.39 MIL/uL — ABNORMAL LOW (ref 3.87–5.11)
RDW: 15.5 % (ref 11.5–15.5)
WBC: 3.3 K/uL — ABNORMAL LOW (ref 4.0–10.5)
nRBC: 0 % (ref 0.0–0.2)

## 2024-01-17 LAB — CMP (CANCER CENTER ONLY)
ALT: 29 U/L (ref 0–44)
AST: 24 U/L (ref 15–41)
Albumin: 3.5 g/dL (ref 3.5–5.0)
Alkaline Phosphatase: 79 U/L (ref 38–126)
Anion gap: 4 — ABNORMAL LOW (ref 5–15)
BUN: 22 mg/dL (ref 8–23)
CO2: 24 mmol/L (ref 22–32)
Calcium: 8.9 mg/dL (ref 8.9–10.3)
Chloride: 111 mmol/L (ref 98–111)
Creatinine: 1.33 mg/dL — ABNORMAL HIGH (ref 0.44–1.00)
GFR, Estimated: 41 mL/min — ABNORMAL LOW (ref >60)
Glucose, Bld: 90 mg/dL (ref 70–99)
Potassium: 4.2 mmol/L (ref 3.5–5.1)
Sodium: 139 mmol/L (ref 135–145)
Total Bilirubin: 0.3 mg/dL (ref 0.0–1.2)
Total Protein: 7.3 g/dL (ref 6.5–8.1)

## 2024-01-17 LAB — SAMPLE TO BLOOD BANK

## 2024-01-17 LAB — TSH: TSH: 1.32 u[IU]/mL (ref 0.350–4.500)

## 2024-01-17 NOTE — Progress Notes (Signed)
 [facility]  [phone] [phone]     ID: Catherine Lowery DOB: [birth year]  MR#: [id number]  RDW#:[id number]  Patient Care Team: [name], NP as PCP - General (Nurse Practitioner) Mona Vinie BROCKS, MD as PCP - Cardiology (Cardiology) Shannon Agent, MD as Consulting Physician (Radiation Oncology) Beverley Evalene BIRCH, MD as Attending Physician (Orthopedic Surgery) Margaret Eduard SAUNDERS, MD as Consulting Physician (Neurology) Nicholaus Sherlean [facility], [facility] (Inactive) as Pharmacist (Pharmacist) OTHER MD:   CHIEF COMPLAINT: Metastatic breast cancer  CURRENT TREATMENT: None  INTERVAL HISTORY: Discussed the use of AI scribe software for clinical note transcription with the patient, who gave verbal consent to proceed.  History of Present Illness  Catherine Lowery is a 78 year old female with breast cancer who presents for follow-up regarding her treatment and symptoms.  She has been experiencing soreness at the site of her port due to it not being flushed for a while. The port was flushed this morning, and blood was drawn from it, which resolved the soreness. The port is exclusively used by her current medical team as others are not familiar with it.  Her current medications include Verzenio  and letrozole  for cancer treatment, along with a blood pressure pill, primidone , potassium, propranolol , and a thyroid  pill. She missed one day of her medication on Saturday due to attending a family reunion and falling asleep before taking her pill. She experiences diarrhea as a side effect of Verzenio  and manages it with Imodium, taking two pills every other day, especially when planning to go out.  No nausea, vomiting, or heartburn. Her weight is stable at 143 pounds, and she maintains a regular eating pattern throughout the day.  Her last PET scan revealed patchy lung opacities and a small area next to the right lobe of her liver. No cough or shortness of breath. Her  hemoglobin level is 8.5, and her platelet count is 103.   COVID 19 VACCINATION STATUS: Pfizer x4, last 10/2020   Breast cancer History  Catherine Lowery had routine screening mammography on 02/26/2018 showing a possible abnormality in the right breast. She underwent unilateral right diagnostic mammography with tomography and right breast ultrasonography at [facility] on 03/07/2018 showing: Breast Density Category B. On mammography, there is a new 1.5 cm irregular architectural distortion with a spiculated margin in the right breast at 5 o'clock anterior depth. On ultrasound, there is a 2.7 cm irregular solid mass in the right breast at 5 o'clock middle depth. This irregular mass displays posterior acoustic shadowing. No significant abnormalities were seen sonographically in the right axilla.  Accordingly on 03/18/2018 she proceeded to biopsy of the right breast area in question. The pathology from this procedure showed ([id number]): Invasive mammary carcinoma e-cadherin negative, grade II-III. 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 Prognostic indicators significant for: estrogen receptor, 95% positive and progesterone receptor, 95% positive, both with strong staining intensity. Proliferation marker Ki67 at 5%. HER2 negative by immunohistochemistry (1+).  On 04/10/2018 the patient underwent bilateral breast MRI with and without contrast showing a 2.3 cm spiculated enhancing mass associated with non-masslike enhancement, the total abnormal area measuring 4.6 cm.  There was no evidence of lymph node abnormality and the contralateral breast was benign appearing  The patient's subsequent history is as detailed below.   PAST MEDICAL HISTORY: Past Medical History:  Diagnosis Date   Cancer (HCC) 03/2018   right breast cancer    ovarian   Chronic kidney disease  CKD   COPD (chronic obstructive pulmonary disease) (HCC)    DJD (degenerative joint disease)    LEFT HIP   Family  history of breast cancer    Family history of ovarian cancer    Family history of prostate cancer    Family history of prostate cancer    H/O migraine 07/01/2019   Hyperlipidemia    takes Simvasatin daily   Hypertension    takes Propranolol  and HCTZ daioly   Hyperthyroidism    Pneumonia    x2   PONV (postoperative nausea and vomiting)    when ether used   Reactive airway disease 05/08/2023   Tremors of nervous system    takes Primidone  daily    PAST SURGICAL HISTORY: Past Surgical History:  Procedure Laterality Date   BOne Spur     left heel   BOWEL RESECTION N/A 08/21/2023   Procedure: SMALL BOWEL RESECTION WITH SIDE-TO-SIDE, FUNCTIONAL END-TO-END ANASTOMOSIS;  Surgeon: Eldonna Mays, MD;  Location: [facility];  Service: Gynecology;  Laterality: N/A;   BREAST LUMPECTOMY WITH RADIOACTIVE SEED AND SENTINEL LYMPH NODE BIOPSY Right 05/07/2018   Procedure: RIGHT BREAST LUMPECTOMY WITH RADIOACTIVE SEED AND RIGHT  SENTINEL LYMPH NODE MAPPING;  Surgeon: Vanderbilt Ned, MD;  Location: [facility];  Service: General;  Laterality: Right;   COLONOSCOPY     IR IMAGING GUIDED PORT INSERTION  05/10/2023   IR US  GUIDE BX ASP/DRAIN  05/10/2023   JOINT REPLACEMENT Bilateral    knee   KNEE SURGERY Left    lateral muscle release in [employer]   OMENTECTOMY N/A 08/21/2023   Procedure: OMENTECTOMY;  Surgeon: Eldonna Mays, MD;  Location: [facility];  Service: Gynecology;  Laterality: N/A;  via robot approach   RE-EXCISION OF BREAST LUMPECTOMY Right 06/05/2018   Procedure: RE-EXCISION OF RIGHT BREAST LUMPECTOMY;  Surgeon: Vanderbilt Ned, MD;  Location: [facility];  Service: General;  Laterality: Right;   ROBOTIC ASSISTED TOTAL HYSTERECTOMY WITH BILATERAL SALPINGO OOPHERECTOMY N/A 08/21/2023   Procedure: ROBOTIC ASSISTED TOTAL LAPAROSCOPY HYSTERECTOMY WITH BILATERAL SALPINGO-OOPHORECTOMY, RADICAL DISSECTION FOR DEBULKING;  Surgeon: Eldonna Mays, MD;  Location: [facility];   Service: Gynecology;  Laterality: N/A;   ROTATOR CUFF REPAIR Bilateral    TOTAL HIP ARTHROPLASTY Left 12/29/2015   Procedure: TOTAL HIP ARTHROPLASTY ANTERIOR APPROACH;  Surgeon: Toribio JULIANNA Chancy, MD;  Location: [facility];  Service: Orthopedics;  Laterality: Left;    FAMILY HISTORY Family History  Problem Relation Age of Onset   Hypertension Mother    Heart disease Mother    Stroke Mother    Multiple myeloma Father 61   Thyroid  cancer Sister        dx 72's/50's   Cancer Paternal Aunt        unknown   Prostate cancer Paternal Uncle        dx >50- cancer was the cause of his death   Ovarian cancer Paternal Aunt    Throat cancer Paternal Uncle    Breast cancer Cousin        dx >50   Cancer Cousin        type unk   Cancer Cousin        type unk   Cancer Cousin        type unk  She notes that her father died from a multiple myeloma at age 3. Patients' mother died from stroke at age 58. The patient has 1 brother and 2 sisters. One paternal aunt had ovarian cancer.  One cousin  had breast cancer, and her 16s. Another paternal aunt had cancer, type unknown to the patient, and a paternal uncle had prostate cancer.    GYNECOLOGIC HISTORY:  Menarche: 78 years old Age at first live birth: 78 years old GX P: 1 LMP: Patient's last menstrual period was 01/24/2000. 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 Before that, she has been employed at [employer], [employer], and as an elderly caregiver. 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 She is separated, and has not spoken to her husband, Earlie Schultze, in over 10 years. She lives with her only son, Marelyn Rouser, who is disabled secondary to SCD and multiple surgeries. She has no pets. She attends [employer].    ADVANCED DIRECTIVES:  Her sister, Velia Muskrat, is Catherine Lowery's medical power of attorney. Velia can be reached at [phone]. Catherine Lowery has stated that her husband, Earlie Schultze, is not to have access to her medical records or to make decisions on her behalf. ]   HEALTH MAINTENANCE: Social History   Tobacco Use   Smoking status: Former    Current packs/day: 0.75    Average packs/day: 0.8 packs/day for 28.0 years (21.0 ttl pk-yrs)    Types: Cigarettes    Passive exposure: Never   Smokeless tobacco: Never   Tobacco comments:    02/13/22 Pt stated I'm trying to quit Catherine Lowery, CMA     Quit February 2025  Vaping Use   Vaping status: Never Used  Substance Use Topics   Alcohol use: No    Alcohol/week: 0.0 standard drinks of alcohol   Drug use: No     Colonoscopy: 01/2015  PAP:   Bone density:  02/26/2018: T-score 1.4   Allergies  Allergen Reactions   Morphine And Codeine Itching and Nausea And Vomiting   Percocet [Oxycodone -Acetaminophen ] Itching and Nausea And Vomiting    Current Outpatient Medications  Medication Sig Dispense Refill   abemaciclib  (VERZENIO ) 100 MG tablet Take 1 tablet (100 mg total) by mouth 2 (two) times daily. 56 tablet 2   albuterol  (VENTOLIN  HFA) 108 (90 Base) MCG/ACT inhaler Inhale 2 puffs into the lungs every 4 (four) hours as needed for wheezing or shortness of breath.     amLODipine  (NORVASC ) 10 MG tablet Take 1 tablet (10 mg total) by mouth daily. 90 tablet 1   aspirin  (ASPIRIN  ADULT) 325 MG tablet enteric coated; may use OTC 30 tablet 0   atorvastatin  (LIPITOR) 40 MG tablet Take 1 tablet (40 mg total) by mouth daily. 90 tablet 1   letrozole  (FEMARA ) 2.5 MG tablet Take 1 tablet (2.5 mg total) by mouth daily. 90 tablet 3   levothyroxine  (SYNTHROID ) 50 MCG tablet Take 1 tablet (50 mcg total) by mouth daily. 90 tablet 3   lidocaine -prilocaine  (EMLA ) cream Apply to affected area once 30 g 3   losartan  (COZAAR ) 25 MG tablet Take 25 mg by mouth daily.     Multiple  Vitamin (MULTIVITAMIN WITH MINERALS) TABS tablet Take 1 tablet by mouth daily.     ondansetron  (ZOFRAN ) 8 MG tablet Take 1 tablet (8 mg total) by mouth every 8 (eight) hours as needed for nausea or vomiting. Start on the third day after carboplatin . 30 tablet 1   POTASSIUM PO Take 1 tablet by mouth  daily.     primidone  (MYSOLINE ) 50 MG tablet Take 2 tablets (100 mg total) by mouth every morning. 180 tablet 2   prochlorperazine  (COMPAZINE ) 10 MG tablet Take 1 tablet (10 mg total) by mouth every 6 (six) hours as needed for nausea or vomiting. 30 tablet 1   propranolol  (INDERAL ) 40 MG tablet Take 0.5 tablets (20 mg total) by mouth 2 (two) times daily. (Patient taking differently: Take 20 mg by mouth daily.) 90 tablet 1   VOLTAREN 1 % GEL APPLY TOPICALLY 4 TIMES DAILY AS NEEDED (Patient taking differently: Apply 2 g topically daily as needed (Arthritis hip pain).) 100 g 0   No current facility-administered medications for this visit.    OBJECTIVE: African-American woman in no acute distress  Vitals:   01/17/24 0852  BP: (!) 116/50  Pulse: (!) 53  Resp: 14  Temp: 97.9 F (36.6 C)  SpO2: 100%     Wt Readings from Last 3 Encounters:  01/17/24 143 lb 14.4 oz (65.3 kg)  12/20/23 140 lb 4.8 oz (63.6 kg)  11/21/23 139 lb 1.6 oz (63.1 kg)   Body mass index is 21.25 kg/m.    ECOG FS:1 - Symptomatic but completely ambulatory   Physical Exam Constitutional:      Appearance: Normal appearance.  Cardiovascular:     Rate and Rhythm: Normal rate and regular rhythm.  Pulmonary:     Effort: Pulmonary effort is normal.     Breath sounds: Normal breath sounds.  Chest:     Comments: Some crackles at lung bases. Abdominal:     General: Abdomen is flat.     Palpations: Abdomen is soft.  Musculoskeletal:        General: No swelling or tenderness.     Cervical back: Normal range of motion. No rigidity.  Lymphadenopathy:     Cervical: No cervical adenopathy.  Skin:    General: Skin is warm  and dry.  Neurological:     Mental Status: She is alert and oriented to person, place, and time.  Psychiatric:        Mood and Affect: Mood normal.       LAB RESULTS:  CMP     Component Value Date/Time   NA 140 12/20/2023 0805   NA 139 11/22/2020 0000   K 4.4 12/20/2023 0805   CL 107 12/20/2023 0805   CO2 28 12/20/2023 0805   GLUCOSE 85 12/20/2023 0805   BUN 17 12/20/2023 0805   CREATININE 1.27 (H) 12/20/2023 0805   CREATININE 1.05 (H) 05/19/2019 1034   CALCIUM  9.3 12/20/2023 0805   PROT 7.5 12/20/2023 0805   ALBUMIN  3.5 12/20/2023 0805   AST 33 12/20/2023 0805   ALT 32 12/20/2023 0805   ALKPHOS 88 12/20/2023 0805   BILITOT 0.3 12/20/2023 0805   GFRNONAA 43 (L) 12/20/2023 0805   GFRNONAA 48 (L) 01/23/2018 0804   GFRAA >60 06/23/2019 1524   GFRAA 56 (L) 01/23/2018 0804    No results found for: TOTALPROTELP, ALBUMINELP, A1GS, A2GS, BETS, BETA2SER, GAMS, MSPIKE, SPEI  No results found for: KPAFRELGTCHN, LAMBDASER, KAPLAMBRATIO  Lab Results  Component Value Date   WBC 3.3 (L) 12/20/2023   NEUTROABS 2.0 12/20/2023   HGB 9.2 (L) 12/20/2023   HCT 28.2 (L) 12/20/2023   MCV 74.4 (L) 12/20/2023   PLT 146 (L) 12/20/2023   No results found for: LABCA2  No components found for: [id number]  No results for input(s): INR in the last 168 hours.  No results found  for: [facility]  Lab Results  Component Value Date   RJW800 74 (H) 07/26/2023    Lab Results  Component Value Date   CAN125 14.2 07/26/2023    No results found for: RJW846  Lab Results  Component Value Date   CA2729 34.9 09/18/2023    No components found for: HGQUANT  Lab Results  Component Value Date   CEA1 3.4 05/09/2023   /  CEA  Date Value Ref Range Status  05/09/2023 3.4 0.0 - 4.7 ng/mL Final    Comment:    (NOTE)                             Nonsmokers          <3.9                             Smokers             <5.6 [employer]  Electrochemiluminescence Immunoassay (ECLIA) Values obtained with different assay methods or kits cannot be used interchangeably.  Results cannot be interpreted as absolute evidence of the presence or absence of malignant disease. Performed At: [facility] [address] [place], [name] [id number] Jennette Shorter MD Ey:[id number]      No results found for: AFPTUMOR  No results found for: [facility]  Lab Results  Component Value Date   HGBA 70.8 (L) 06/23/2019   HGBA2QUANT 3.6 (H) 06/23/2019   HGBFQUANT 0.0 06/23/2019   HGBSQUAN 25.6 (H) 06/23/2019   (Hemoglobinopathy evaluation)   Lab Results  Component Value Date   LDH 147 06/23/2019    Lab Results  Component Value Date   IRON 59 05/09/2023   TIBC 228 (L) 05/09/2023   IRONPCTSAT 26 05/09/2023   (Iron and TIBC)  Lab Results  Component Value Date   FERRITIN 263 05/09/2023    Urinalysis    Component Value Date/Time   COLORURINE YELLOW 05/09/2023 0225   APPEARANCEUR CLEAR 05/09/2023 0225   LABSPEC >1.046 (H) 05/09/2023 0225   PHURINE 5.0 05/09/2023 0225   GLUCOSEU NEGATIVE 05/09/2023 0225   HGBUR SMALL (A) 05/09/2023 0225   BILIRUBINUR NEGATIVE 05/09/2023 0225   KETONESUR NEGATIVE 05/09/2023 0225   PROTEINUR 100 (A) 05/09/2023 0225   UROBILINOGEN 0.2 03/24/2011 1054   NITRITE NEGATIVE 05/09/2023 0225   LEUKOCYTESUR NEGATIVE 05/09/2023 0225    STUDIES:  No results found.     ELIGIBLE FOR AVAILABLE RESEARCH PROTOCOL: no   ASSESSMENT: 78 y.o. [name], [name] woman status post right breast lower inner quadrant biopsy 03/18/2018 for a clinical T2N0, [name], grade 2 or 3, estrogen and progesterone receptor positive, [name], with an MIB-1 of 5%  (1) Genetic testing performed through [facility] Common Hereditary Cancers Panel + Thyroid  Cancer Panel on 05/09/2018 showing no deleterious mutations APC, [name], [name], [name], [name], [name], [name],  [name], [name], [name], [name], [name], [name], [name], [name], [name], [name], [name], [name], [name], [name], KIT, MEN1, [name], [name], [name], [name], [name], [name], [name], [name], [name], [name], [name], [name], [name], POLE, [name], [name], RAD50, [name], [name], [name], [name], [name], [name], [name], [name], [name], [name], [name], [name], [name], [name], [name], VHL.   (a) Two variants of uncertain significance in the genes [name] c.1176C>T (Silent) and [name] c.1419+4C>T (Intronic) were identified.  (2) right lumpectomy and sentinel lymph node sampling 05/07/2018 showed a pT2 pN1, [name], grade 2,  with a positive inferior margin  (a) reexcision scheduled for 06/05/2018  (3) MammaPrint obtained from the definitive surgical sample read as low risk predicting a disease-free survival at 5 years of 97.8% without need of chemotherapy.  (4) adjuvant radiation 07/11/2018 - 08/27/2018  (a) 1. Right breast; [name]; [name]; 6 fractions of 2 Gy for a total of 12 Gy  (5) anastrozole  started 10/02/2018  (a) bone density 05/05/2021, [name], normal imits       (6) genetic testing 05/18/2018 through the Common Hereditary Cancers Panel + Thyroid  Caner panel showed no deleterious mutations in APC, [name], [name], [name], [name], [name], [name], [name], [name], [name], [name], [name], [name], [name], [name], [name], [name], [name], [name], [name], [name], KIT, MEN1, [name], [name], [name], [name], [name], [name], [name], [name], [name], [name], [name], [name], [name], POLE, [name], [name], RAD50, [name], [name], [name], [name], [name], [name], [name], [name], [name], [name], [name], [name], [name], [name], [name], VHL.  (a) 2 variants of uncertain significance in the genes [name] c.1176C>T (Silent) and [name] c.1419+4C>T (Intronic) were identified.    (b) UPDATE: The [name]  c.1419+4C>T (Intronic) VUS has been reclassified to Likely Benign. The report date is 08/08/2018.  (7) the patient has thalassemia/sickle cell trait: Hemoglobin electrophoresis 06/23/2019 shows hemoglobin 87.8%, [name], hemoglobin A2 elevated at 3.6%   PLAN: Assessment & Plan  Metastatic breast cancer with retroperitoneal involvement Recent PET scan ireviewed once again, no new respiratory symptoms Verzenio  and letrozole  prescribed. - Continue Verzenio  and letrozole .  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  No other GI symptoms.  - Continue Imodium as needed, typically two pills on days when going out.  Anemia Anemia with hemoglobin at 8.5. No transfusion needed. - Monitor hemoglobin levels.  Thrombocytopenia Thrombocytopenia with platelet count of 103. Likely secondary to verzenio , Will continue to monitor.  Total time spent: 30 minutes.  *Total Encounter Time as defined by the Centers for Medicare and Medicaid Services includes, in addition to the face-to-face time of a patient visit (documented in the note above) non-face-to-face time: obtaining and reviewing outside history, ordering and reviewing medications, tests or procedures, care coordination (communications with other health care professionals or caregivers) and documentation in the medical record.

## 2024-01-17 NOTE — Research (Signed)
 [id number]- Effectiveness of Out-of Pocket Cost Communication and Financial Navigation (CostCOM) in Cancer Patients:      Patient was seen in person today to complete her 6 month follow up questionnaire on paper for this financial navigation study Study ARM A. Patient was accompanied by her two sisters. Patient was assisted with filling out the questionnaire by this Coordinator and her two sisters. Patient has a history of seizures thus one sister wrote the answers on the patient's behalf and read the questions to her aloud. Patient is aware that research will be in contact for her next follow up. Patient was previously provided contact information if she has any questions.  Laury Quale, MPH  Clinical Research Coordinator

## 2024-01-31 ENCOUNTER — Other Ambulatory Visit: Payer: Self-pay

## 2024-02-04 ENCOUNTER — Other Ambulatory Visit: Payer: Self-pay

## 2024-02-04 NOTE — Progress Notes (Signed)
 Specialty Pharmacy Refill Coordination Note  GENOLA YUILLE is a 78 y.o. female contacted today regarding refills of specialty medication(s) Abemaciclib  (VERZENIO )   Patient requested Delivery   Delivery date: 02/11/24   Verified address: [address]  [place]   Medication will be filled on 02/08/24.

## 2024-02-07 ENCOUNTER — Other Ambulatory Visit: Payer: Self-pay

## 2024-02-14 ENCOUNTER — Inpatient Hospital Stay: Admitting: Hematology and Oncology

## 2024-02-14 ENCOUNTER — Inpatient Hospital Stay: Attending: Psychiatry

## 2024-02-14 VITALS — BP 134/58 | HR 68 | Temp 97.8°F | Resp 16 | Wt 151.4 lb

## 2024-02-14 DIAGNOSIS — C50311 Malignant neoplasm of lower-inner quadrant of right female breast: Secondary | ICD-10-CM

## 2024-02-14 DIAGNOSIS — Z1721 Progesterone receptor positive status: Secondary | ICD-10-CM | POA: Insufficient documentation

## 2024-02-14 DIAGNOSIS — Z87891 Personal history of nicotine dependence: Secondary | ICD-10-CM | POA: Diagnosis not present

## 2024-02-14 DIAGNOSIS — Z170 Estrogen receptor positive status [ER+]: Secondary | ICD-10-CM | POA: Insufficient documentation

## 2024-02-14 DIAGNOSIS — Z79811 Long term (current) use of aromatase inhibitors: Secondary | ICD-10-CM | POA: Diagnosis not present

## 2024-02-14 DIAGNOSIS — Z1732 Human epidermal growth factor receptor 2 negative status: Secondary | ICD-10-CM | POA: Insufficient documentation

## 2024-02-14 DIAGNOSIS — C799 Secondary malignant neoplasm of unspecified site: Secondary | ICD-10-CM

## 2024-02-14 DIAGNOSIS — D61818 Other pancytopenia: Secondary | ICD-10-CM

## 2024-02-14 DIAGNOSIS — C562 Malignant neoplasm of left ovary: Secondary | ICD-10-CM

## 2024-02-14 DIAGNOSIS — Z79899 Other long term (current) drug therapy: Secondary | ICD-10-CM | POA: Insufficient documentation

## 2024-02-14 LAB — CMP (CANCER CENTER ONLY)
ALT: 20 U/L (ref 0–44)
AST: 26 U/L (ref 15–41)
Albumin: 3.5 g/dL (ref 3.5–5.0)
Alkaline Phosphatase: 73 U/L (ref 38–126)
Anion gap: 5 (ref 5–15)
BUN: 20 mg/dL (ref 8–23)
CO2: 25 mmol/L (ref 22–32)
Calcium: 9.2 mg/dL (ref 8.9–10.3)
Chloride: 111 mmol/L (ref 98–111)
Creatinine: 1.26 mg/dL — ABNORMAL HIGH (ref 0.44–1.00)
GFR, Estimated: 43 mL/min — ABNORMAL LOW (ref >60)
Glucose, Bld: 114 mg/dL — ABNORMAL HIGH (ref 70–99)
Potassium: 4 mmol/L (ref 3.5–5.1)
Sodium: 141 mmol/L (ref 135–145)
Total Bilirubin: 0.3 mg/dL (ref 0.0–1.2)
Total Protein: 7.2 g/dL (ref 6.5–8.1)

## 2024-02-14 LAB — CBC WITH DIFFERENTIAL/PLATELET
Abs Immature Granulocytes: 0.01 K/uL (ref 0.00–0.07)
Basophils Absolute: 0.1 K/uL (ref 0.0–0.1)
Basophils Relative: 1 %
Eosinophils Absolute: 0.1 K/uL (ref 0.0–0.5)
Eosinophils Relative: 2 %
HCT: 24.5 % — ABNORMAL LOW (ref 36.0–46.0)
Hemoglobin: 8.1 g/dL — ABNORMAL LOW (ref 12.0–15.0)
Immature Granulocytes: 0 %
Lymphocytes Relative: 34 %
Lymphs Abs: 1.2 K/uL (ref 0.7–4.0)
MCH: 25.5 pg — ABNORMAL LOW (ref 26.0–34.0)
MCHC: 33.1 g/dL (ref 30.0–36.0)
MCV: 77 fL — ABNORMAL LOW (ref 80.0–100.0)
Monocytes Absolute: 0.3 K/uL (ref 0.1–1.0)
Monocytes Relative: 9 %
Neutro Abs: 1.9 K/uL (ref 1.7–7.7)
Neutrophils Relative %: 54 %
Platelets: 120 K/uL — ABNORMAL LOW (ref 150–400)
RBC: 3.18 MIL/uL — ABNORMAL LOW (ref 3.87–5.11)
RDW: 15.1 % (ref 11.5–15.5)
WBC: 3.6 K/uL — ABNORMAL LOW (ref 4.0–10.5)
nRBC: 0 % (ref 0.0–0.2)

## 2024-02-14 LAB — SAMPLE TO BLOOD BANK

## 2024-02-14 NOTE — Progress Notes (Signed)
 [name]  Telephone:(336) [phone] Fax:(336) [phone]     ID: PANG ROBERS DOB: 1945/08/13  MR#: [id number]  RDW#:[id number]  Patient Care Team: Catherine Cramp, NP as PCP - General (Nurse Practitioner) Catherine Vinie BROCKS, MD as PCP - Cardiology (Cardiology) Catherine Agent, MD as Consulting Physician (Radiation Oncology) Catherine Evalene BIRCH, MD as Attending Physician (Orthopedic Surgery) Catherine Eduard SAUNDERS, MD as Consulting Physician (Neurology) Catherine Lowery, [facility] (Inactive) as Pharmacist (Pharmacist) OTHER MD:   CHIEF COMPLAINT: Metastatic breast cancer  CURRENT TREATMENT: None  INTERVAL HISTORY: Discussed the use of AI scribe software for clinical note transcription with the patient, who gave verbal consent to proceed.  History of Present Illness  Catherine Lowery is a 78 year old female with breast cancer on verzenio  and letrozole  who presented for follow up. She is doing well except for fatigue. She says she doesn't do much, mostly reads book or plays a game. Her sister help with cooking. She denies any change in breathing, bowel habits or urinary habits Rest of the pertinent 10 point ROS reviewed and neg.   COVID 19 VACCINATION STATUS: Pfizer x4, last 10/2020   Breast cancer History  Catherine Lowery had routine screening mammography on 02/26/2018 showing a possible abnormality in the right breast. She underwent unilateral right diagnostic mammography with tomography and right breast ultrasonography at [facility] on 03/07/2018 showing: Breast Density Category B. On mammography, there is a new 1.5 cm irregular architectural distortion with a spiculated margin in the right breast at 5 o'clock anterior depth. On ultrasound, there is a 2.7 cm irregular solid mass in the right breast at 5 o'clock middle depth. This irregular mass displays posterior acoustic shadowing. No significant abnormalities were seen sonographically in the right axilla.  Accordingly on  03/18/2018 she proceeded to biopsy of the right breast area in question. The pathology from this procedure showed ([id number]): Invasive mammary carcinoma e-cadherin negative, grade II-III. 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 Prognostic indicators significant for: estrogen receptor, 95% positive and progesterone receptor, 95% positive, both with strong staining intensity. Proliferation marker Ki67 at 5%. HER2 negative by immunohistochemistry (1+).  On 04/10/2018 the patient underwent bilateral breast MRI with and without contrast showing a 2.3 cm spiculated enhancing mass associated with non-masslike enhancement, the total abnormal area measuring 4.6 cm.  There was no evidence of lymph node abnormality and the contralateral breast was benign appearing  The patient's subsequent history is as detailed below.   PAST MEDICAL HISTORY: Past Medical History:  Diagnosis Date   Cancer (HCC) 03/2018   right breast cancer    ovarian   Chronic kidney disease    CKD   COPD (chronic obstructive pulmonary disease) (HCC)    DJD (degenerative joint disease)    LEFT HIP   Family history of breast cancer    Family history of ovarian cancer    Family history of prostate cancer    Family history of prostate cancer    H/O migraine 07/01/2019   Hyperlipidemia    takes Simvasatin daily   Hypertension    takes Propranolol  and HCTZ daioly   Hyperthyroidism    Pneumonia    x2   PONV (postoperative nausea and vomiting)    when ether used   Reactive airway disease 05/08/2023   Tremors of nervous system    takes Primidone  daily    PAST SURGICAL HISTORY: Past Surgical History:  Procedure Laterality Date   BOne Spur  left heel   BOWEL RESECTION N/A 08/21/2023   Procedure: SMALL BOWEL RESECTION WITH SIDE-TO-SIDE, FUNCTIONAL END-TO-END ANASTOMOSIS;  Surgeon: Eldonna Mays, MD;  Location: [facility];  Service: Gynecology;  Laterality: N/A;   BREAST LUMPECTOMY WITH RADIOACTIVE  SEED AND SENTINEL LYMPH NODE BIOPSY Right 05/07/2018   Procedure: RIGHT BREAST LUMPECTOMY WITH RADIOACTIVE SEED AND RIGHT  SENTINEL LYMPH NODE MAPPING;  Surgeon: Vanderbilt Ned, MD;  Location: [facility];  Service: General;  Laterality: Right;   COLONOSCOPY     IR IMAGING GUIDED PORT INSERTION  05/10/2023   IR US  GUIDE BX ASP/DRAIN  05/10/2023   JOINT REPLACEMENT Bilateral    knee   KNEE SURGERY Left    lateral muscle release in [employer]   OMENTECTOMY N/A 08/21/2023   Procedure: OMENTECTOMY;  Surgeon: Eldonna Mays, MD;  Location: [facility];  Service: Gynecology;  Laterality: N/A;  via robot approach   RE-EXCISION OF BREAST LUMPECTOMY Right 06/05/2018   Procedure: RE-EXCISION OF RIGHT BREAST LUMPECTOMY;  Surgeon: Vanderbilt Ned, MD;  Location: [facility];  Service: General;  Laterality: Right;   ROBOTIC ASSISTED TOTAL HYSTERECTOMY WITH BILATERAL SALPINGO OOPHERECTOMY N/A 08/21/2023   Procedure: ROBOTIC ASSISTED TOTAL LAPAROSCOPY HYSTERECTOMY WITH BILATERAL SALPINGO-OOPHORECTOMY, RADICAL DISSECTION FOR DEBULKING;  Surgeon: Eldonna Mays, MD;  Location: [facility];  Service: Gynecology;  Laterality: N/A;   ROTATOR CUFF REPAIR Bilateral    TOTAL HIP ARTHROPLASTY Left 12/29/2015   Procedure: TOTAL HIP ARTHROPLASTY ANTERIOR APPROACH;  Surgeon: Toribio JULIANNA Chancy, MD;  Location: [facility];  Service: Orthopedics;  Laterality: Left;    FAMILY HISTORY Family History  Problem Relation Age of Onset   Hypertension Mother    Heart disease Mother    Stroke Mother    Multiple myeloma Father 1   Thyroid  cancer Sister        dx 41's/50's   Cancer Paternal Aunt        unknown   Prostate cancer Paternal Uncle        dx >50- cancer was the cause of his death   Ovarian cancer Paternal Aunt    Throat cancer Paternal Uncle    Breast cancer Cousin        dx >50   Cancer Cousin        type unk   Cancer Cousin        type unk   Cancer Cousin        type unk  She notes that her  father died from a multiple myeloma at age 27. Patients' mother died from stroke at age 80. The patient has 1 brother and 2 sisters. One paternal aunt had ovarian cancer.  One cousin had breast cancer, and her 24s. Another paternal aunt had cancer, type unknown to the patient, and a paternal uncle had prostate cancer.    GYNECOLOGIC HISTORY:  Menarche: 78 years old Age at first live birth: 78 years old GX P: 1 LMP: Patient's last menstrual period was 01/24/2000. 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 Before that, she has been employed at [employer], [employer], and as an elderly caregiver. 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 She is separated, and has not spoken to her husband, Earlie Schultze, in over 10 years. She lives with her only son, Daisa Stennis,  who is disabled secondary to SCD and multiple surgeries. She has no pets. She attends [employer].    ADVANCED DIRECTIVES: Her sister, Velia Muskrat, is Anayelli's medical power of attorney. Velia can be reached at [phone]. Strome has stated that her husband, Earlie Schultze, is not to have access to her medical records or to make decisions on her behalf. ]   HEALTH MAINTENANCE: Social History   Tobacco Use   Smoking status: Former    Current packs/day: 0.75    Average packs/day: 0.8 packs/day for 28.0 years (21.0 ttl pk-yrs)    Types: Cigarettes    Passive exposure: Never   Smokeless tobacco: Never   Tobacco comments:    02/13/22 Pt stated I'm trying to quit Archie JONELLE Che, CMA     Quit February 2025  Vaping Use   Vaping status: Never Used  Substance Use Topics   Alcohol use: No    Alcohol/week: 0.0 standard drinks of alcohol   Drug use: No     Colonoscopy: 01/2015  PAP:   Bone density:  02/26/2018:  T-score 1.4   Allergies  Allergen Reactions   Morphine And Codeine Itching and Nausea And Vomiting   Percocet [Oxycodone -Acetaminophen ] Itching and Nausea And Vomiting    Current Outpatient Medications  Medication Sig Dispense Refill   abemaciclib  (VERZENIO ) 100 MG tablet Take 1 tablet (100 mg total) by mouth 2 (two) times daily. 56 tablet 2   albuterol  (VENTOLIN  HFA) 108 (90 Base) MCG/ACT inhaler Inhale 2 puffs into the lungs every 4 (four) hours as needed for wheezing or shortness of breath.     amLODipine  (NORVASC ) 10 MG tablet Take 1 tablet (10 mg total) by mouth daily. 90 tablet 1   aspirin  (ASPIRIN  ADULT) 325 MG tablet enteric coated; may use OTC 30 tablet 0   atorvastatin  (LIPITOR) 40 MG tablet Take 1 tablet (40 mg total) by mouth daily. 90 tablet 1   letrozole  (FEMARA ) 2.5 MG tablet Take 1 tablet (2.5 mg total) by mouth daily. 90 tablet 3   levothyroxine  (SYNTHROID ) 50 MCG tablet Take 1 tablet (50 mcg total) by mouth daily. 90 tablet 3   lidocaine -prilocaine  (EMLA ) cream Apply to affected area once 30 g 3   losartan  (COZAAR ) 25 MG tablet Take 25 mg by mouth daily.     Multiple Vitamin (MULTIVITAMIN WITH MINERALS) TABS tablet Take 1 tablet by mouth daily.     ondansetron  (ZOFRAN ) 8 MG tablet Take 1 tablet (8 mg total) by mouth every 8 (eight) hours as needed for nausea or vomiting. Start on the third day after carboplatin . 30 tablet 1   POTASSIUM PO Take 1 tablet by mouth daily.     primidone  (MYSOLINE ) 50 MG tablet Take 2 tablets (100 mg total) by mouth every morning. 180 tablet 2   prochlorperazine  (COMPAZINE ) 10 MG tablet Take 1 tablet (10 mg total) by mouth every 6 (six) hours as needed for nausea or vomiting. 30 tablet 1   propranolol  (INDERAL ) 40 MG tablet Take 0.5 tablets (20 mg total) by mouth 2 (two) times daily. (Patient taking differently: Take 20 mg by mouth daily.) 90 tablet 1   VOLTAREN 1 % GEL APPLY TOPICALLY 4 TIMES DAILY AS NEEDED (Patient taking differently: Apply 2  g topically daily as needed (Arthritis hip pain).) 100 g 0   No current facility-administered medications for this visit.    OBJECTIVE: African-American woman in no acute distress  Vitals:   02/14/24 0838  BP: (!) 134/58  Pulse: 68  Resp: 16  Temp: 97.8 F (36.6 C)  SpO2: 99%     Wt Readings from Last 3 Encounters:  02/14/24 151 lb 6.4 oz (68.7 kg)  01/17/24 143 lb 14.4 oz (65.3 kg)  12/20/23 140 lb 4.8 oz (63.6 kg)   Body mass index is 22.36 kg/m.    ECOG FS:1 - Symptomatic but completely ambulatory   Physical Exam Constitutional:      Appearance: Normal appearance.  Cardiovascular:     Rate and Rhythm: Normal rate and regular rhythm.  Pulmonary:     Effort: Pulmonary effort is normal.     Breath sounds: Normal breath sounds.  Abdominal:     General: Abdomen is flat.     Palpations: Abdomen is soft.  Musculoskeletal:        General: No swelling or tenderness.     Cervical back: Normal range of motion. No rigidity.  Lymphadenopathy:     Cervical: No cervical adenopathy.  Skin:    General: Skin is warm and dry.  Neurological:     Mental Status: She is alert and oriented to person, place, and time.  Psychiatric:        Mood and Affect: Mood normal.     LAB RESULTS:  CMP     Component Value Date/Time   NA 139 01/17/2024 0840   NA 139 11/22/2020 0000   K 4.2 01/17/2024 0840   CL 111 01/17/2024 0840   CO2 24 01/17/2024 0840   GLUCOSE 90 01/17/2024 0840   BUN 22 01/17/2024 0840   CREATININE 1.33 (H) 01/17/2024 0840   CREATININE 1.05 (H) 05/19/2019 1034   CALCIUM  8.9 01/17/2024 0840   PROT 7.3 01/17/2024 0840   ALBUMIN  3.5 01/17/2024 0840   AST 24 01/17/2024 0840   ALT 29 01/17/2024 0840   ALKPHOS 79 01/17/2024 0840   BILITOT 0.3 01/17/2024 0840   GFRNONAA 41 (L) 01/17/2024 0840   GFRNONAA 48 (L) 01/23/2018 0804   GFRAA >60 06/23/2019 1524   GFRAA 56 (L) 01/23/2018 0804    No results found for: TOTALPROTELP, ALBUMINELP, A1GS, A2GS,  BETS, BETA2SER, GAMS, MSPIKE, SPEI  No results found for: KPAFRELGTCHN, LAMBDASER, KAPLAMBRATIO  Lab Results  Component Value Date   WBC 3.6 (L) 02/14/2024   NEUTROABS 1.9 02/14/2024   HGB 8.1 (L) 02/14/2024   HCT 24.5 (L) 02/14/2024   MCV 77.0 (L) 02/14/2024   PLT 120 (L) 02/14/2024   No results found for: LABCA2  No components found for: OJARJW874  No results for input(s): INR in the last 168 hours.  No results found for: [facility]  Lab Results  Component Value Date   RJW800 62 (H) 07/26/2023    Lab Results  Component Value Date   CAN125 14.2 07/26/2023    No results found for: RJW846  Lab Results  Component Value Date   CA2729 34.9 09/18/2023    No components found for: HGQUANT  Lab Results  Component Value Date   CEA1 3.4 05/09/2023   /  CEA  Date Value Ref Range Status  05/09/2023 3.4 0.0 - 4.7 ng/mL Final    Comment:    (NOTE)                             Nonsmokers          <3.9  Smokers             <5.6 Roche Diagnostics Electrochemiluminescence Immunoassay (ECLIA) Values obtained with different assay methods or kits cannot be used interchangeably.  Results cannot be interpreted as absolute evidence of the presence or absence of malignant disease. Performed At: [facility] [address] [place], [name] [id number] Jennette Shorter MD Ey:[id number]      No results found for: AFPTUMOR  No results found for: [facility]  Lab Results  Component Value Date   HGBA 70.8 (L) 06/23/2019   HGBA2QUANT 3.6 (H) 06/23/2019   HGBFQUANT 0.0 06/23/2019   HGBSQUAN 25.6 (H) 06/23/2019   (Hemoglobinopathy evaluation)   Lab Results  Component Value Date   LDH 147 06/23/2019    Lab Results  Component Value Date   IRON 59 05/09/2023   TIBC 228 (L) 05/09/2023   IRONPCTSAT 26 05/09/2023   (Iron and TIBC)  Lab Results  Component Value Date   FERRITIN 263 05/09/2023    Urinalysis     Component Value Date/Time   COLORURINE YELLOW 05/09/2023 0225   APPEARANCEUR CLEAR 05/09/2023 0225   LABSPEC >1.046 (H) 05/09/2023 0225   PHURINE 5.0 05/09/2023 0225   GLUCOSEU NEGATIVE 05/09/2023 0225   HGBUR SMALL (A) 05/09/2023 0225   BILIRUBINUR NEGATIVE 05/09/2023 0225   KETONESUR NEGATIVE 05/09/2023 0225   PROTEINUR 100 (A) 05/09/2023 0225   UROBILINOGEN 0.2 03/24/2011 1054   NITRITE NEGATIVE 05/09/2023 0225   LEUKOCYTESUR NEGATIVE 05/09/2023 0225    STUDIES:  No results found.     ELIGIBLE FOR AVAILABLE RESEARCH PROTOCOL: no   ASSESSMENT: 78 y.o. [name], [name] woman status post right breast lower inner quadrant biopsy 03/18/2018 for a clinical T2N0, [name], grade 2 or 3, estrogen and progesterone receptor positive, [name], with an MIB-1 of 5%  (1) Genetic testing performed through [facility] Common Hereditary Cancers Panel + Thyroid  Cancer Panel on 05/09/2018 showing no deleterious mutations APC, [name], [name], [name], [name], [name], [name], [name], [name], [name], [name], [name], [name], [name], [name], [name], [name], [name], [name], [name], [name], KIT, MEN1, [name], [name], [name], [name], [name], [name], [name], [name], [name], [name], [name], [name], [name], POLE, [name], [name], RAD50, [name], [name], [name], [name], [name], [name], [name], [name], [name], [name], [name], [name], [name], [name], [name], VHL.   (a) Two variants of uncertain significance in the genes [name] c.1176C>T (Silent) and [name] c.1419+4C>T (Intronic) were identified.  (2) right lumpectomy and sentinel lymph node sampling 05/07/2018 showed a pT2 pN1, [name], grade 2, with a positive inferior margin  (a) reexcision scheduled for 06/05/2018  (3) MammaPrint obtained from the definitive surgical sample read as low risk predicting a disease-free survival at 5 years of 97.8% without need of chemotherapy.  (4) adjuvant radiation 07/11/2018 -  08/27/2018  (a) 1. Right breast; [name]; [name]; 6 fractions of 2 Gy for a total of 12 Gy  (5) anastrozole  started 10/02/2018  (a) bone density 05/05/2021, [name], normal imits       (6) genetic testing 05/18/2018 through the Common Hereditary  Cancers Panel + Thyroid  Caner panel showed no deleterious mutations in APC, [name], [name], [name], [name], [name], [name], [name], [name], [name], [name], [name], [name], [name], [name], [name], [name], [name], [name], [name], [name], KIT, MEN1, [name], [name], [name], [name], [name], [name], [name], [name], [name], [name], [name], [name], [name], POLE, [name], [name], RAD50, [name], [name], [name], [name], [name], [name], [name], [name], [name], [name], [name], [name], [name], [name], [name], VHL.  (a) 2 variants of uncertain significance in the genes [name] c.1176C>T (Silent) and [name] c.1419+4C>T (Intronic) were identified.    (b) UPDATE: The [name] c.1419+4C>T (Intronic) VUS has been reclassified to Likely Benign. The report date is 08/08/2018.  (7) the patient has thalassemia/sickle cell trait: Hemoglobin electrophoresis 06/23/2019 shows hemoglobin 87.8%, [name], hemoglobin A2 elevated at 3.6%   PLAN: Assessment & Plan  Metastatic breast cancer with retroperitoneal involvement Last PET scan reviewed once again, no new respiratory symptoms, 9 mm hypermetabolic retroperitoneal nodule noted. - We will repeat PET scan in November - Continue Verzenio  and letrozole .  Foundation one with PIK3CA, can use it for second line treatment   Anemia Anemia with hemoglobin at 8.1. No transfusion needed. - Given microcytosis and hypochromia, we can give a trial of iron and follow up with repeat labs. Other possibility could be bone marrow involvement. This is expected to improve as the tumor responds.  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 Couldn't start adj chemotherapy because of the diagnosis of met breast cancer. She was discussed in TB, and the recommendation was to start her on treatment for breast cancer and continue surveillance.  Mild thrombocytopenia, ok to monitor.  Total time spent: 40 minutes.  *Total Encounter Time as defined by the Centers for Medicare and Medicaid Services includes, in addition to the face-to-face time of a patient visit (documented in the note above) non-face-to-face time: obtaining and reviewing outside history, ordering and reviewing medications, tests or procedures, care coordination (communications with other health care professionals or caregivers) and documentation in the medical record.

## 2024-02-15 LAB — TSH: TSH: 2.3 u[IU]/mL (ref 0.350–4.500)

## 2024-02-18 ENCOUNTER — Telehealth: Payer: Self-pay

## 2024-02-18 ENCOUNTER — Other Ambulatory Visit (HOSPITAL_COMMUNITY): Payer: Self-pay

## 2024-02-18 NOTE — Telephone Encounter (Signed)
 Oral Oncology Patient Advocate Encounter   Received notification that prior authorization for Verzenio  is due for renewal.   PA submitted on 02/18/24 Key [id number] Status is pending      Charlott Hamilton,  CPhT-Adv  she/her/hers [facility]  [facility] Pharmacy Technician Patient Advocate Specialist III [facility] Phone: [phone]  Fax: [phone] [email or web address]

## 2024-02-19 NOTE — Telephone Encounter (Signed)
 Oral Oncology Patient Advocate Encounter  Prior Authorization Renewal  for Verzenio  has been approved.    Key [id number]  Effective dates: 02/18/24 through 08/16/24     Charlott Hamilton,  CPhT-Adv  she/her/hers [place]  [facility] Pharmacy Technician Patient Advocate Specialist III [facility] Phone: [phone]  Fax: [phone] [email or web address]

## 2024-02-29 ENCOUNTER — Other Ambulatory Visit (HOSPITAL_COMMUNITY): Payer: Self-pay

## 2024-02-29 ENCOUNTER — Other Ambulatory Visit: Payer: Self-pay | Admitting: Hematology and Oncology

## 2024-02-29 ENCOUNTER — Other Ambulatory Visit: Payer: Self-pay

## 2024-02-29 MED ORDER — ABEMACICLIB 100 MG PO TABS
100.0000 mg | ORAL_TABLET | Freq: Two times a day (BID) | ORAL | 2 refills | Status: DC
  Filled 2024-02-29 – 2024-03-13 (×4): qty 56, 28d supply, fill #0

## 2024-03-04 ENCOUNTER — Other Ambulatory Visit (HOSPITAL_COMMUNITY): Payer: Self-pay

## 2024-03-06 ENCOUNTER — Other Ambulatory Visit (HOSPITAL_COMMUNITY): Payer: Self-pay

## 2024-03-07 ENCOUNTER — Other Ambulatory Visit: Payer: Self-pay

## 2024-03-07 ENCOUNTER — Other Ambulatory Visit (HOSPITAL_COMMUNITY): Payer: Self-pay

## 2024-03-10 ENCOUNTER — Telehealth: Payer: Self-pay

## 2024-03-10 ENCOUNTER — Encounter (HOSPITAL_COMMUNITY): Admission: RE | Admit: 2024-03-10 | Source: Ambulatory Visit

## 2024-03-10 NOTE — Telephone Encounter (Signed)
 S/w patient directly about need to reschedule upcoming appointments d/t rescheduling of PET scan.  Patient agreeable and knows to expect a member of the scheduling team to be reaching out to reschedule.

## 2024-03-10 NOTE — Telephone Encounter (Signed)
 Returned call and LVM for patient regarding upcoming appointments. Patient states that her recent PET scan had to be rescheduled d/t incorrect prep. Patient has been rescheduled for PET scan for 11/21. Patient had prior appointments for 11/14 for labs and MD visit with Dr. Loretha to review scan. Patient aware that appointments will be cancelled and rescheduled for after her PET scan.  Scheduling message sent to schedulers to update appointments. Patient knows to expect phone call from scheduling team to reschedule. Call back number provided should patient have any questions.

## 2024-03-13 ENCOUNTER — Other Ambulatory Visit: Payer: Self-pay

## 2024-03-13 ENCOUNTER — Other Ambulatory Visit (HOSPITAL_COMMUNITY): Payer: Self-pay

## 2024-03-13 NOTE — Progress Notes (Signed)
 Specialty Pharmacy Refill Coordination Note  Catherine Lowery is a 78 y.o. female contacted today regarding refills of specialty medication(s) Abemaciclib  (VERZENIO )   Patient requested Delivery   Delivery date: 03/14/24   Verified address: [address]  [place]   Medication will be filled on: 03/13/24

## 2024-03-14 ENCOUNTER — Inpatient Hospital Stay

## 2024-03-14 ENCOUNTER — Other Ambulatory Visit (HOSPITAL_COMMUNITY): Payer: Self-pay

## 2024-03-14 ENCOUNTER — Inpatient Hospital Stay: Admitting: Hematology and Oncology

## 2024-03-17 ENCOUNTER — Inpatient Hospital Stay: Attending: Psychiatry | Admitting: Psychiatry

## 2024-03-17 ENCOUNTER — Encounter: Payer: Self-pay | Admitting: Psychiatry

## 2024-03-17 ENCOUNTER — Inpatient Hospital Stay: Attending: Psychiatry

## 2024-03-17 VITALS — BP 125/55 | HR 55 | Temp 97.6°F | Resp 18 | Wt 148.0 lb

## 2024-03-17 DIAGNOSIS — D6481 Anemia due to antineoplastic chemotherapy: Secondary | ICD-10-CM | POA: Diagnosis not present

## 2024-03-17 DIAGNOSIS — Z9079 Acquired absence of other genital organ(s): Secondary | ICD-10-CM | POA: Diagnosis not present

## 2024-03-17 DIAGNOSIS — R600 Localized edema: Secondary | ICD-10-CM | POA: Insufficient documentation

## 2024-03-17 DIAGNOSIS — C778 Secondary and unspecified malignant neoplasm of lymph nodes of multiple regions: Secondary | ICD-10-CM | POA: Diagnosis not present

## 2024-03-17 DIAGNOSIS — Z923 Personal history of irradiation: Secondary | ICD-10-CM | POA: Diagnosis not present

## 2024-03-17 DIAGNOSIS — C569 Malignant neoplasm of unspecified ovary: Secondary | ICD-10-CM | POA: Diagnosis present

## 2024-03-17 DIAGNOSIS — Z90710 Acquired absence of both cervix and uterus: Secondary | ICD-10-CM | POA: Diagnosis not present

## 2024-03-17 DIAGNOSIS — D569 Thalassemia, unspecified: Secondary | ICD-10-CM | POA: Diagnosis not present

## 2024-03-17 DIAGNOSIS — C562 Malignant neoplasm of left ovary: Secondary | ICD-10-CM

## 2024-03-17 DIAGNOSIS — Z79811 Long term (current) use of aromatase inhibitors: Secondary | ICD-10-CM | POA: Diagnosis not present

## 2024-03-17 DIAGNOSIS — Z79899 Other long term (current) drug therapy: Secondary | ICD-10-CM | POA: Insufficient documentation

## 2024-03-17 DIAGNOSIS — Z90722 Acquired absence of ovaries, bilateral: Secondary | ICD-10-CM | POA: Diagnosis not present

## 2024-03-17 DIAGNOSIS — Z170 Estrogen receptor positive status [ER+]: Secondary | ICD-10-CM | POA: Insufficient documentation

## 2024-03-17 DIAGNOSIS — C787 Secondary malignant neoplasm of liver and intrahepatic bile duct: Secondary | ICD-10-CM | POA: Diagnosis not present

## 2024-03-17 DIAGNOSIS — D573 Sickle-cell trait: Secondary | ICD-10-CM | POA: Diagnosis not present

## 2024-03-17 DIAGNOSIS — R197 Diarrhea, unspecified: Secondary | ICD-10-CM | POA: Insufficient documentation

## 2024-03-17 DIAGNOSIS — C50311 Malignant neoplasm of lower-inner quadrant of right female breast: Secondary | ICD-10-CM | POA: Diagnosis not present

## 2024-03-17 NOTE — Patient Instructions (Signed)
 It was a pleasure to see you in clinic today. - Normal exam today - We will follow-up the ca125 (blood work) - Return visit planned for 75mo  Thank you very much for allowing me to provide care for you today.  I appreciate your confidence in choosing our Gynecologic Oncology team at [facility].  If you have any questions about your visit today please call our office or send us  a MyChart message and we will get back to you as soon as possible.

## 2024-03-17 NOTE — Progress Notes (Signed)
 Gynecologic Oncology Return Clinic Visit  Date of Service: 03/17/2024 Referring Provider: Dr. Almarie Bedford   Assessment & Plan: Catherine Lowery is a 78 y.o. woman with Stage IIIC who is s/p RA-TLH, BSO, radical dissection for tumor debulking, omentectomy, small bowel S2S functional end-to-end anastomosis on 08/21/23, postop course complicated by partial SBO, managed conservatively.  Final pathology with no residual high-grade serous carcinoma but with evidence of metastatic breast cancer. Presents today for surveillance.  Ovarian cancer: - No residual HGSOC on surgical specimen but metastatic breast cancer. - Given no residual high-grade serous carcinoma but evidence of metastatic breast cancer on pathology, recommended optimizing treatment for breast cancer.  Following with Dr. Loretha. - Last PET scan on 12/06/2023 with numerous patchy irregular nodules in the lungs, no enlarged lymph nodes, small focus adjacent to the right hepatic lobe, no osseous disease, hypermetabolic focus in the left supraclavicular region likely a thyroid  lesion.  Next PET scan is scheduled for 03/21/2024.  Defer imaging to Dr. Loretha.  - Signs and symptoms of recurrence reviewed. - Continue surveillance q 3 months for the first 2 years.  - CA125 is a marker for the patient.  We can continue to follow this. Ordered for today.  Breast cancer - On Verzenio  and letrozole , following with Dr. Loretha - Defer imaging to Dr. Loretha.  Right foot drop - Resolved  RTC 61mo.  Hoy Masters, MD Gynecologic Oncology   Medical Decision Making I personally spent  TOTAL 20 minutes face-to-face and non-face-to-face in the care of this patient, which includes all pre, intra, and post visit time on the date of service.    ----------------------- Reason for Visit: Follow-up  Treatment History: Oncology History Overview Note  BRCA/HRD neg   Malignant neoplasm of lower-inner quadrant of right breast of female, estrogen receptor  positive (HCC)  03/18/2018 Initial Diagnosis   Screening mammography on 02/27/2019 revealed a right breast abnormality, Right breast lower inner quadrant biopsy for a clinical T2N0, [name], grade 2 or 3, estrogen and progesterone receptor positive, [name], with an MIB-1 of 5%   05/07/2018 Cancer Staging   Staging form: Breast, AJCC 8th Edition - Pathologic stage from 05/07/2018: Stage IB (pT2, pN35mi, cM0, G2, ER+, PR+, HER2-) - Signed by Crawford Morna Pickle, NP on 04/07/2019   05/07/2018 Surgery   pT2 pN1, [name], grade 2, with a positive inferior margin.   05/07/2018 Oncotype testing   MammaPrint obtained from the definitive surgical sample read as low risk predicting a disease-free survival at 5 years of 97.8% without need of chemotherapy.   05/18/2018 Genetic Testing   The Common Hereditary Cancers Panel + Thyroid  Caner panel was ordered (55 genes): APC, [name], [name], [name], [name], [name], [name], [name], [name], [name], [name], [name], [name], [name], [name], [name], [name], [name], [name], [name], [name], KIT, MEN1, [name], [name], [name], [name], [name], [name], [name], [name], [name], [name], [name], [name], [name], POLE, [name], [name], RAD50, [name], [name], [name], [name], [name], [name], [name], [name], [name], [name], [name], [name], [name], [name], [name], VHL.  Results: No pathogenic variants identified.  2 variants of uncertain significance in the genes [name] c.1176C>T (Silent) and [name] c.1419+4C>T (Intronic) were identified.  The date of this test report is 05/18/2018.   UPDATE: The [name] c.1419+4C>T (Intronic) VUS has been reclassified to Likely Benign. The report date is 08/08/2018. UPDATE: The [name] c.1176C>T (Silent) VUS has been reclassified to Likely Benign. The report date is 09/20/2022.    06/05/2018 Surgery   (  [id number]) Reexcision of the right breast; all margins negative.   07/11/2018 - 08/27/2018 Radiation Therapy   The  patient initially received a dose of 50.4 Gy in 28 fractions to the breast using whole-breast tangent fields with an additional 45 Gy in 25 fractions to the right axilla. This was delivered using a 3-D conformal technique. The pt received a boost delivering an additional 12 Gy in 6 fractions using a electron boost with electrons. The total dose was 107.4 Gy.    09/2018 - 09/2023 Anti-estrogen oral therapy   anastrozole    Left ovarian epithelial cancer (HCC)  05/08/2023 Initial Diagnosis   Left ovarian epithelial cancer (HCC)   05/08/2023 Imaging   IR IMAGING GUIDED PORT INSERTION Result Date: 05/10/2023 INDICATION: Concern for metastatic ovarian cancer. Please perform image guided biopsy of peritoneal mass for tissue diagnostic purposes. Please perform image guided Port a catheter placement for durable intravenous access for impending chemotherapy. EXAM: 1. IMPLANTED PORT A CATH PLACEMENT WITH ULTRASOUND AND FLUOROSCOPIC GUIDANCE 2. ULTRASOUND-GUIDED LEFT VENTRAL LATERAL PERITONEAL ABDOMINAL MASS BIOPSY COMPARISON:  Chest CT-05/09/2023 CT abdomen and pelvis-05/08/2023 MEDICATIONS: None ANESTHESIA/SEDATION: Moderate (conscious) sedation was employed during this procedure as administered by the Interventional Radiology RN. A total of Versed  3 mg and Fentanyl  125 mcg was administered intravenously. Moderate Sedation Time: 46 minutes. The patient's level of consciousness and vital signs were monitored continuously by radiology nursing throughout the procedure under my direct supervision. CONTRAST:  None FLUOROSCOPY TIME:  42 seconds (4 mGy) COMPLICATIONS: None immediate. PROCEDURE: The procedure, risks, benefits, and alternatives were explained to the patient. Questions regarding the procedure were encouraged and answered. The patient understands and consents to the procedure. The right neck and chest were prepped with chlorhexidine  in a sterile fashion, and a sterile drape was applied covering the operative  field. Maximum barrier sterile technique with sterile gowns and gloves were used for the procedure. A timeout was performed prior to the initiation of the procedure. Local anesthesia was provided with 1% lidocaine  with epinephrine . After creating a small venotomy incision, a micropuncture kit was utilized to access the internal jugular vein. Real-time ultrasound guidance was utilized for vascular access including the acquisition of a permanent ultrasound image documenting patency of the accessed vessel. The microwire was utilized to measure appropriate catheter length. A subcutaneous port pocket was then created along the upper chest wall utilizing a combination of sharp and blunt dissection. The pocket was irrigated with sterile saline. A single lumen Slim sized power injectable port was chosen for placement. The 8 Fr catheter was tunneled from the port pocket site to the venotomy incision. The port was placed in the pocket. The external catheter was trimmed to appropriate length. At the venotomy, an 8 Fr peel-away sheath was placed over a guidewire under fluoroscopic guidance. The catheter was then placed through the sheath and the sheath was removed. Final catheter positioning was confirmed and documented with a fluoroscopic spot radiograph. The port was accessed with a Huber needle, aspirated and flushed with heparinized saline. The venotomy site was closed with an interrupted 4-0 Vicryl suture. The port pocket incision was closed with interrupted 2-0 Vicryl suture. Dermabond and Steri-strips were applied to both incisions. Dressings were applied. Attention was now paid towards image guided biopsy of dominant peritoneal mass. Sonographic evaluation performed of the ventral aspect of the left mid abdomen demonstrating an at least 8.3 x 2.1 cm hypoechoic macrolobulated mass (image 1), correlating with the mass seen on preceding abdominal CT image 50, series  3. The procedure was planned. The skin overlying the  operative site was prepped and draped in usual sterile fashion. After the overlying soft tissues were anesthetized with 1% lidocaine  with epinephrine , a 17 gauge coaxial needle was advanced into the peripheral aspect of the mass. Next, under direct ultrasound guidance, 6 core needle biopsies were obtained yielding the return of adequate tissue. Samples were submitted to the laboratory for pathologic analysis. The coaxial needle was removed following the administration of a Gel-Foam slurry and superficial hemostasis achieved with manual compression. Postprocedural imaging was negative for complication, specifically, no significant Peri biopsy hematoma. A dressing was applied. The patient tolerated the above procedures well without immediate post procedural complication. FINDINGS: After catheter placement, the tip lies within the superior cavoatrial junction. The catheter aspirates and flushes normally and is ready for immediate use. Sonographic evaluation of the left mid abdomen demonstrates an at least 8.3 x 2.1 cm hypoechoic macrolobulated mass, correlating with the mass seen on preceding abdominal CT. IMPRESSION: 1. Successful placement of a right internal jugular approach power injectable Port-A-Cath. The catheter is ready for immediate use. 2. Successful ultrasound-guided biopsy of peritoneal mass within the ventral aspect of the left mid/lateral abdomen. Electronically Signed   By: Norleen Roulette M.D.   On: 05/10/2023 14:59   IR US  Guide Bx Asp/Drain Result Date: 05/10/2023 INDICATION: Concern for metastatic ovarian cancer. Please perform image guided biopsy of peritoneal mass for tissue diagnostic purposes. Please perform image guided Port a catheter placement for durable intravenous access for impending chemotherapy. EXAM: 1. IMPLANTED PORT A CATH PLACEMENT WITH ULTRASOUND AND FLUOROSCOPIC GUIDANCE 2. ULTRASOUND-GUIDED LEFT VENTRAL LATERAL PERITONEAL ABDOMINAL MASS BIOPSY COMPARISON:  Chest CT-05/09/2023 CT  abdomen and pelvis-05/08/2023 MEDICATIONS: None ANESTHESIA/SEDATION: Moderate (conscious) sedation was employed during this procedure as administered by the Interventional Radiology RN. A total of Versed  3 mg and Fentanyl  125 mcg was administered intravenously. Moderate Sedation Time: 46 minutes. The patient's level of consciousness and vital signs were monitored continuously by radiology nursing throughout the procedure under my direct supervision. CONTRAST:  None FLUOROSCOPY TIME:  42 seconds (4 mGy) COMPLICATIONS: None immediate. PROCEDURE: The procedure, risks, benefits, and alternatives were explained to the patient. Questions regarding the procedure were encouraged and answered. The patient understands and consents to the procedure. The right neck and chest were prepped with chlorhexidine  in a sterile fashion, and a sterile drape was applied covering the operative field. Maximum barrier sterile technique with sterile gowns and gloves were used for the procedure. A timeout was performed prior to the initiation of the procedure. Local anesthesia was provided with 1% lidocaine  with epinephrine . After creating a small venotomy incision, a micropuncture kit was utilized to access the internal jugular vein. Real-time ultrasound guidance was utilized for vascular access including the acquisition of a permanent ultrasound image documenting patency of the accessed vessel. The microwire was utilized to measure appropriate catheter length. A subcutaneous port pocket was then created along the upper chest wall utilizing a combination of sharp and blunt dissection. The pocket was irrigated with sterile saline. A single lumen Slim sized power injectable port was chosen for placement. The 8 Fr catheter was tunneled from the port pocket site to the venotomy incision. The port was placed in the pocket. The external catheter was trimmed to appropriate length. At the venotomy, an 8 Fr peel-away sheath was placed over a guidewire  under fluoroscopic guidance. The catheter was then placed through the sheath and the sheath was removed. Final catheter positioning was confirmed  and documented with a fluoroscopic spot radiograph. The port was accessed with a Huber needle, aspirated and flushed with heparinized saline. The venotomy site was closed with an interrupted 4-0 Vicryl suture. The port pocket incision was closed with interrupted 2-0 Vicryl suture. Dermabond and Steri-strips were applied to both incisions. Dressings were applied. Attention was now paid towards image guided biopsy of dominant peritoneal mass. Sonographic evaluation performed of the ventral aspect of the left mid abdomen demonstrating an at least 8.3 x 2.1 cm hypoechoic macrolobulated mass (image 1), correlating with the mass seen on preceding abdominal CT image 50, series 3. The procedure was planned. The skin overlying the operative site was prepped and draped in usual sterile fashion. After the overlying soft tissues were anesthetized with 1% lidocaine  with epinephrine , a 17 gauge coaxial needle was advanced into the peripheral aspect of the mass. Next, under direct ultrasound guidance, 6 core needle biopsies were obtained yielding the return of adequate tissue. Samples were submitted to the laboratory for pathologic analysis. The coaxial needle was removed following the administration of a Gel-Foam slurry and superficial hemostasis achieved with manual compression. Postprocedural imaging was negative for complication, specifically, no significant Peri biopsy hematoma. A dressing was applied. The patient tolerated the above procedures well without immediate post procedural complication. FINDINGS: After catheter placement, the tip lies within the superior cavoatrial junction. The catheter aspirates and flushes normally and is ready for immediate use. Sonographic evaluation of the left mid abdomen demonstrates an at least 8.3 x 2.1 cm hypoechoic macrolobulated mass,  correlating with the mass seen on preceding abdominal CT. IMPRESSION: 1. Successful placement of a right internal jugular approach power injectable Port-A-Cath. The catheter is ready for immediate use. 2. Successful ultrasound-guided biopsy of peritoneal mass within the ventral aspect of the left mid/lateral abdomen. Electronically Signed   By: Norleen Roulette M.D.   On: 05/10/2023 14:59   CT Angio Chest Aorta W and/or Wo Contrast Result Date: 05/09/2023 CLINICAL DATA:  Constipation and abdominal pain. Acute aortic syndrome suspected. EXAM: CT ANGIOGRAPHY CHEST WITH CONTRAST TECHNIQUE: Multidetector CT imaging of the chest was performed using the standard protocol during bolus administration of intravenous contrast. Multiplanar CT image reconstructions and MIPs were obtained to evaluate the vascular anatomy. RADIATION DOSE REDUCTION: This exam was performed according to the departmental dose-optimization program which includes automated exposure control, adjustment of the mA and/or kV according to patient size and/or use of iterative reconstruction technique. CONTRAST:  60mL OMNIPAQUE  IOHEXOL  350 MG/ML SOLN COMPARISON:  Radiograph 04/27/2023 and CT chest 05/15/2022 FINDINGS: Cardiovascular: Normal heart size. No pericardial effusion. Aortic atherosclerotic calcification. No aortic aneurysm, dissection, or penetrating atherosclerotic ulcer. Coronary artery calcification. Mediastinum/Nodes: Trachea and esophagus are unremarkable. No thoracic adenopathy. Lungs/Pleura: Centrilobular emphysema greatest in the apices. Bronchial wall thickening and mucous plugging in the lower lobes. Right lower lobe atelectasis. Clustered centrilobular micro nodules and tree-in-bud opacities in the right upper lobe. No pleural effusion or pneumothorax. Upper Abdomen: No acute abnormality. Musculoskeletal: No acute fracture.  Thoracic spondylosis. Review of the MIP images confirms the above findings. IMPRESSION: 1. No aortic aneurysm or  dissection. 2. Emphysema and chronic bronchitis. Aortic Atherosclerosis (ICD10-I70.0) and Emphysema (ICD10-J43.9). Electronically Signed   By: Norman Gatlin M.D.   On: 05/09/2023 00:15   CT ABDOMEN PELVIS W CONTRAST Result Date: 05/08/2023 CLINICAL DATA:  Left lower quadrant pain EXAM: CT ABDOMEN AND PELVIS WITH CONTRAST TECHNIQUE: Multidetector CT imaging of the abdomen and pelvis was performed using the standard protocol following bolus administration  of intravenous contrast. RADIATION DOSE REDUCTION: This exam was performed according to the departmental dose-optimization program which includes automated exposure control, adjustment of the mA and/or kV according to patient size and/or use of iterative reconstruction technique. CONTRAST:  50mL OMNIPAQUE  IOHEXOL  350 MG/ML SOLN COMPARISON:  CT 10/30/2015 FINDINGS: Lower chest: Lung bases demonstrate bandlike scarring within the lower lobes and right middle lobe. No acute airspace disease. Hepatobiliary: Subcentimeter hypodensities in the liver too small to further characterize. No calcified gallstone or biliary dilatation. Pancreas: Unremarkable. No pancreatic ductal dilatation or surrounding inflammatory changes. Spleen: Normal in size without focal abnormality. Adrenals/Urinary Tract: Adrenal glands show no focal abnormality. Kidneys show no hydronephrosis. Simple bilateral renal cysts for which no imaging follow-up is recommended. 2.1 cm superior pole exophytic complex cyst. Bladder partially obscured Stomach/Bowel: Stomach nonenlarged. No dilated small bowel. No acute bowel wall thickening. Diverticular disease of the left colon. Vascular/Lymphatic: Advanced aortic atherosclerosis. No aneurysm. Mildly enlarged left Peri aortic node measuring 19 mm on series 3, image 42. Reproductive: Uterus is unremarkable. Heterogenous left adnexal mass lesion measuring 6.8 x 5.8 by 7 cm concerning for neoplasm. Other: Small volume free fluid within the pelvis. Evidence for  peritoneal metastatic disease. 10 mm left upper quadrant peritoneal nodule on series 3, image 35. Right mid quadrant soft tissue mass measuring 5.7 x 2.4 cm on series 3, image 44. Left lower quadrant soft tissue mass measuring 8.1 by 2.5 by 12.1 cm. Soft tissue nodule in the right colic gutter measuring 1.7 cm on series 3, image 55. Right posterior pelvic soft tissue mass measuring 3.6 x 2.6 cm on series 3, image 76. Additional small mesenteric nodules are noted. Musculoskeletal: No acute or suspicious osseous abnormality. Left hip replacement with artifact. IMPRESSION: 1. Large heterogeneous cystic and solid left adnexal mass highly suspicious for ovarian neoplasm. 2. Positive for peritoneal metastatic disease as evidence by multiple intra-abdominal and pelvic tumor implants. Trace volume ascites in the pelvis. Several mildly enlarged retroperitoneal lymph nodes also suspicious for metastatic disease. 3. Diverticular disease of the left colon without acute inflammatory process. 4. Aortic atherosclerosis. Aortic Atherosclerosis (ICD10-I70.0). Electronically Signed   By: Luke Bun M.D.   On: 05/08/2023 19:53   DG Chest 2 View Result Date: 04/27/2023 CLINICAL DATA:  Cough.  Chills and congestion. EXAM: CHEST - 2 VIEW COMPARISON:  03/15/2020. FINDINGS: Redemonstration of irregular opacity overlying the right lateral costophrenic angle, which corresponds to area of scarring/atelectasis on the prior CT scan from 05/15/2022. Bilateral lungs are otherwise clear. No acute consolidation or lung collapse. Bilateral costophrenic angles are clear. Normal cardio-mediastinal silhouette. No acute osseous abnormalities. The soft tissues are within normal limits. IMPRESSION: No active cardiopulmonary disease. Electronically Signed   By: Ree Molt M.D.   On: 04/27/2023 15:46      05/09/2023 Tumor Marker   Patient's tumor was tested for the following markers: CA-125. Results of the tumor marker test revealed 137.    05/10/2023 Pathology Results   SURGICAL PATHOLOGY  CASE: [id number]  PATIENT: Noreene Runde  Surgical Pathology Report   Clinical History: concern for metastatic ovarian cancer (cm)   FINAL MICROSCOPIC DIAGNOSIS:   A. OMENTUM, MASS WITHIN LEFT MID ABDOMEN, NEEDLE CORE BIOPSY:  - Metastatic carcinoma with a gynecologic primary, see comment  Immunohistochemical stain show that the tumor cells are positive for CK7 and PAX8 while they are negative for CK20 and GATA3 with above interpretation.  Dr. LeGolvan reviewed the case and concurs with the above diagnosis.    05/14/2023  Cancer Staging   Staging form: Ovary, Fallopian Tube, and Primary Peritoneal Carcinoma, AJCC 8th Edition - Clinical stage from 05/14/2023: FIGO Stage IIIC (cT3c, cN1, cM0) - Signed by Lonn Hicks, MD on 05/14/2023 Stage prefix: Initial diagnosis   05/25/2023 - 07/26/2023 Chemotherapy   Patient is on Treatment Plan : OVARIAN Carboplatin  (AUC 6) + Paclitaxel  (175) q21d X 6 Cycles     07/11/2023 Imaging   CT ABDOMEN PELVIS W CONTRAST Result Date: 07/23/2023 CLINICAL DATA:  Ovarian cancer, assess treatment response. * Tracking Code: BO * EXAM: CT ABDOMEN AND PELVIS WITH CONTRAST TECHNIQUE: Multidetector CT imaging of the abdomen and pelvis was performed using the standard protocol following bolus administration of intravenous contrast. RADIATION DOSE REDUCTION: This exam was performed according to the departmental dose-optimization program which includes automated exposure control, adjustment of the mA and/or kV according to patient size and/or use of iterative reconstruction technique. CONTRAST:  OMNIPAQUE  IOHEXOL  300 MG/ML  SOLN COMPARISON:  Abdominopelvic CT 05/08/2023 and 10/30/2015 FINDINGS: Lower chest: Similar bandlike scarring at both lung bases. No confluent airspace disease, suspicious nodularity or pleural effusion. Aortic and coronary artery atherosclerosis noted. Hepatobiliary: The liver is normal in density  without suspicious focal abnormality. Stable small low-density lesions inferiorly in the right hepatic lobe, likely cysts. No evidence of gallstones, gallbladder wall thickening or biliary dilatation. Pancreas: Unremarkable. No pancreatic ductal dilatation or surrounding inflammatory changes. Spleen: Normal in size without focal abnormality. Adrenals/Urinary Tract: Stable mild chronic enlargement of both adrenal glands, unchanged from 2017, consistent with mild hyperplasia. No evidence of urinary tract calculus, suspicious renal lesion or hydronephrosis. Stable bilateral renal cysts for which no specific follow-up imaging is recommended. The urinary bladder is largely obscured by artifact from the left total hip arthroplasty. No bladder abnormality identified. Stomach/Bowel: Enteric contrast has passed into the distal small bowel. The stomach appears unremarkable for its degree of distension. No evidence of bowel wall thickening, distention or surrounding inflammatory change. Prominent stool throughout the colon, especially within the rectum. Mild distal colonic diverticulosis. Vascular/Lymphatic: Interval resolution of previously demonstrated left periaortic adenopathy. No enlarged abdominal or pelvic lymph nodes are identified. Diffuse aortic and branch vessel atherosclerosis without evidence of aneurysm or large vessel occlusion. Reproductive: Interval decreased size of previously demonstrated left adnexal mass, now measuring 4.7 x 4.1 cm on image 61/2 (previously 6.8 x 5.8 cm). No right adnexal mass. The uterus appears unremarkable. Other: Previously demonstrated omental nodularity has substantially improved in the interval. No significant residual measurable omental disease demonstrated. There is no significant ascites. A right perirectal mass measuring 2.5 x 1.7 cm on image 75/2 previously measured up to 3.6 cm. No progressive peritoneal disease identified. Musculoskeletal: No acute or significant osseous  findings. Multilevel spondylosis. Previous left total hip arthroplasty with moderate right hip degenerative changes. IMPRESSION: 1. 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 No evidence of progressive disease. 2. Interval resolution of previously demonstrated left periaortic adenopathy. 3. No acute findings or evidence of progressive disease. 4.  Aortic Atherosclerosis (ICD10-I70.0). Electronically Signed   By: Elsie Perone M.D.   On: 07/23/2023 11:31      08/21/2023 Surgery   Preoperative Diagnosis: Stage IIIC Carcinoma of likely Gyn Origin   Postoperative Diagnosis: Same   Procedures: Robotic-assisted total laparoscopic hysterectomy, bilateral salpingo-oophorectomy, radical dissection for tumor debulking, omentectomy, small bowel resection and side-to-side functional end-to-end anastomosis   Surgeon: Hoy Masters, MD    Findings: Upper abdominal survey with omentum and liver edge  adherent to the anterior abdominal wall initially occluding view of bilateral diaphragm.  Following dissection of adhesions, able to view the liver surface and bilateral diaphragm with evidence of true disease but no concerning areas for active disease.  Omentum with small nodularity and scarring consistent with treated disease and possibly some residual small volume disease.  Small bowel and mesentery with miliary areas of treated versus miliary ongoing disease.  Solid left adnexal mass densely adherent to the left pelvic sidewall, the rectosigmoid colon mesentery, and a loop of distal ileum.  Dense fibrosis also encountered in the left pelvic sidewall.  Overall normal-appearing right ovary adherent to the ovarian fossa.  Apparent treated peritoneal disease to the right of the rectosigmoid colon without clear evidence of ongoing disease, biopsied.  At conclusion of case, R0 versus R1 resection pending if miliary disease (biopsied) on bowel and mesentery  reflects treated versus active disease.         Interval History: Patient reports she is overall doing well.  Has been experiencing some diarrhea on Verzenio .  Had some weight loss at the initiation of her treatment but has been gaining her weight back.  Notes that her foot drop got better on its own.  Did not need any PT.  No current issues.  Otherwise no new vaginal bleeding, abdominal/pelvic pain, change in bladder habits, early satiety, bloating, nausea/vomiting.    Past Medical/Surgical History: Past Medical History:  Diagnosis Date   Cancer (HCC) 03/2018   right breast cancer    ovarian   Chronic kidney disease    CKD   COPD (chronic obstructive pulmonary disease) (HCC)    DJD (degenerative joint disease)    LEFT HIP   Family history of breast cancer    Family history of ovarian cancer    Family history of prostate cancer    Family history of prostate cancer    H/O migraine 07/01/2019   Hyperlipidemia    takes Simvasatin daily   Hypertension    takes Propranolol  and HCTZ daioly   Hyperthyroidism    Pneumonia    x2   PONV (postoperative nausea and vomiting)    when ether used   Reactive airway disease 05/08/2023   Tremors of nervous system    takes Primidone  daily    Past Surgical History:  Procedure Laterality Date   BOne Spur     left heel   BOWEL RESECTION N/A 08/21/2023   Procedure: SMALL BOWEL RESECTION WITH SIDE-TO-SIDE, FUNCTIONAL END-TO-END ANASTOMOSIS;  Surgeon: Eldonna Mays, MD;  Location: [facility];  Service: Gynecology;  Laterality: N/A;   BREAST LUMPECTOMY WITH RADIOACTIVE SEED AND SENTINEL LYMPH NODE BIOPSY Right 05/07/2018   Procedure: RIGHT BREAST LUMPECTOMY WITH RADIOACTIVE SEED AND RIGHT  SENTINEL LYMPH NODE MAPPING;  Surgeon: Vanderbilt Ned, MD;  Location: [facility];  Service: General;  Laterality: Right;   COLONOSCOPY     IR IMAGING GUIDED PORT INSERTION  05/10/2023   IR US  GUIDE BX ASP/DRAIN  05/10/2023   JOINT REPLACEMENT  Bilateral    knee   KNEE SURGERY Left    lateral muscle release in [employer]   OMENTECTOMY N/A 08/21/2023   Procedure: OMENTECTOMY;  Surgeon: Eldonna Mays, MD;  Location: [facility];  Service: Gynecology;  Laterality: N/A;  via robot approach   RE-EXCISION OF BREAST LUMPECTOMY Right 06/05/2018   Procedure: RE-EXCISION OF RIGHT BREAST LUMPECTOMY;  Surgeon: Vanderbilt Ned, MD;  Location: [facility];  Service: General;  Laterality: Right;   ROBOTIC ASSISTED TOTAL  HYSTERECTOMY WITH BILATERAL SALPINGO OOPHERECTOMY N/A 08/21/2023   Procedure: ROBOTIC ASSISTED TOTAL LAPAROSCOPY HYSTERECTOMY WITH BILATERAL SALPINGO-OOPHORECTOMY, RADICAL DISSECTION FOR DEBULKING;  Surgeon: Eldonna Mays, MD;  Location: [facility];  Service: Gynecology;  Laterality: N/A;   ROTATOR CUFF REPAIR Bilateral    TOTAL HIP ARTHROPLASTY Left 12/29/2015   Procedure: TOTAL HIP ARTHROPLASTY ANTERIOR APPROACH;  Surgeon: Toribio JULIANNA Chancy, MD;  Location: [facility];  Service: Orthopedics;  Laterality: Left;    Family History  Problem Relation Age of Onset   Hypertension Mother    Heart disease Mother    Stroke Mother    Multiple myeloma Father 78   Thyroid  cancer Sister        dx 92's/50's   Cancer Paternal Aunt        unknown   Prostate cancer Paternal Uncle        dx >50- cancer was the cause of his death   Ovarian cancer Paternal Aunt    Throat cancer Paternal Uncle    Breast cancer Cousin        dx >50   Cancer Cousin        type unk   Cancer Cousin        type unk   Cancer Cousin        type unk    Social History   Socioeconomic History   Marital status: Single    Spouse name: Not on file   Number of children: 1   Years of education: 14   Highest education level: Not on file  Occupational History    Comment: retired  Tobacco Use   Smoking status: Former    Current packs/day: 0.75    Average packs/day: 0.8 packs/day for 28.0 years (21.0 ttl pk-yrs)    Types: Cigarettes    Passive exposure: Never    Smokeless tobacco: Never   Tobacco comments:    02/13/22 Pt stated I'm trying to quit Archie JONELLE Che, CMA     Quit February 2025  Vaping Use   Vaping status: Never Used  Substance and Sexual Activity   Alcohol use: No    Alcohol/week: 0.0 standard drinks of alcohol   Drug use: No   Sexual activity: Not Currently    Birth control/protection: Post-menopausal  Other Topics Concern   Not on file  Social History Narrative   Lives with handicapped son   Caffeine- coffee, 2 cups daily, 1-2 sodas daily   Social Drivers of Health   Financial Resource Strain: Low Risk  (01/28/2020)   Overall Financial Resource Strain (CARDIA)    Difficulty of Paying Living Expenses: Not very hard  Food Insecurity: No Food Insecurity (08/21/2023)   Hunger Vital Sign    Worried About Running Out of Food in the Last Year: Never true    Ran Out of Food in the Last Year: Never true  Transportation Needs: No Transportation Needs (08/21/2023)   PRAPARE - Administrator, Civil Service (Medical): No    Lack of Transportation (Non-Medical): No  Physical Activity: Not on file  Stress: Not on file  Social Connections: Moderately Integrated (08/21/2023)   Social Connection and Isolation Panel    Frequency of Communication with Friends and Family: More than three times a week    Frequency of Social Gatherings with Friends and Family: Three times a week    Attends Religious Services: More than 4 times per year    Active Member of Clubs or Organizations: Yes    Attends Club  or Organization Meetings: Never    Marital Status: Divorced    Current Medications:  Current Outpatient Medications:    abemaciclib  (VERZENIO ) 100 MG tablet, Take 1 tablet (100 mg total) by mouth 2 (two) times daily., Disp: 56 tablet, Rfl: 2   albuterol  (VENTOLIN  HFA) 108 (90 Base) MCG/ACT inhaler, Inhale 2 puffs into the lungs every 4 (four) hours as needed for wheezing or shortness of breath., Disp: , Rfl:    amLODipine   (NORVASC ) 10 MG tablet, Take 1 tablet (10 mg total) by mouth daily., Disp: 90 tablet, Rfl: 1   aspirin  (ASPIRIN  ADULT) 325 MG tablet, enteric coated; may use OTC, Disp: 30 tablet, Rfl: 0   atorvastatin  (LIPITOR) 40 MG tablet, Take 1 tablet (40 mg total) by mouth daily., Disp: 90 tablet, Rfl: 1   letrozole  (FEMARA ) 2.5 MG tablet, Take 1 tablet (2.5 mg total) by mouth daily., Disp: 90 tablet, Rfl: 3   levothyroxine  (SYNTHROID ) 50 MCG tablet, Take 1 tablet (50 mcg total) by mouth daily., Disp: 90 tablet, Rfl: 3   lidocaine -prilocaine  (EMLA ) cream, Apply to affected area once, Disp: 30 g, Rfl: 3   losartan  (COZAAR ) 25 MG tablet, Take 25 mg by mouth daily., Disp: , Rfl:    Multiple Vitamin (MULTIVITAMIN WITH MINERALS) TABS tablet, Take 1 tablet by mouth daily., Disp: , Rfl:    ondansetron  (ZOFRAN ) 8 MG tablet, Take 1 tablet (8 mg total) by mouth every 8 (eight) hours as needed for nausea or vomiting. Start on the third day after carboplatin ., Disp: 30 tablet, Rfl: 1   POTASSIUM PO, Take 1 tablet by mouth daily., Disp: , Rfl:    primidone  (MYSOLINE ) 50 MG tablet, Take 2 tablets (100 mg total) by mouth every morning., Disp: 180 tablet, Rfl: 2   prochlorperazine  (COMPAZINE ) 10 MG tablet, Take 1 tablet (10 mg total) by mouth every 6 (six) hours as needed for nausea or vomiting., Disp: 30 tablet, Rfl: 1   propranolol  (INDERAL ) 40 MG tablet, Take 0.5 tablets (20 mg total) by mouth 2 (two) times daily. (Patient taking differently: Take 20 mg by mouth daily.), Disp: 90 tablet, Rfl: 1   VOLTAREN 1 % GEL, APPLY TOPICALLY 4 TIMES DAILY AS NEEDED (Patient taking differently: Apply 2 g topically daily as needed (Arthritis hip pain).), Disp: 100 g, Rfl: 0  Review of Symptoms: Complete 10-system review is positive for: Diarrhea, leg swelling  Physical Exam: BP (!) 125/55 (BP Location: Left Arm, Patient Position: Sitting)   Pulse (!) 55   Temp 97.6 F (36.4 C) (Oral)   Resp 18   Wt 148 lb (67.1 kg)   LMP  01/24/2000   SpO2 100%   BMI 21.86 kg/m  General: Alert, oriented, no acute distress. HEENT: Normocephalic, atraumatic. Neck symmetric without masses. Sclera anicteric. Posterior oropharynx clear. Chest: Normal work of breathing. Clear to auscultation bilaterally.   Cardiovascular: Regular rate and rhythm, no murmurs. Abdomen: Soft, nontender.  Normoactive bowel sounds.  No masses or hepatosplenomegaly appreciated.  Well-healed incisions. Extremities: Grossly normal range of motion.  Warm, well perfused.  No edema bilaterally. Skin: No rashes or lesions noted. Lymphatics: No cervical, supraclavicular, or inguinal adenopathy. GU: Normal appearing external genitalia without erythema, excoriation, or lesions.  Speculum exam reveals atrophic vaginal mucosa, normal cuff, no lesions.  Bimanual exam reveals smooth cuff, no nodularity or pelvic mass.  Exam chaperoned by Rosaline Gunner, NT     Laboratory & Radiologic Studies: Lab Results  Component Value Date   CAN125 14.2 07/26/2023  CAN125 137.0 (H) 05/09/2023   CEA1 3.4 05/09/2023   LDH 147 06/23/2019    NM PET Image Initial (PI) Skull Base To Thigh 12/06/2023  Narrative CLINICAL DATA:  Initial treatment strategy for metastatic breast cancer.  EXAM: NUCLEAR MEDICINE PET SKULL BASE TO THIGH  TECHNIQUE: 6.99 mCi F-18 FDG was injected intravenously. Full-ring PET imaging was performed from the skull base to thigh after the radiotracer. CT data was obtained and used for attenuation correction and anatomic localization.  Fasting blood glucose: 89 mg/dl  COMPARISON:  Chest CT 10/04/2023 and CT abdomen 08/26/2023  FINDINGS: Mediastinal blood pool activity: SUV max 2.3  Liver activity: SUV max NA  NECK: No hypermetabolic lymph nodes in the neck.  Incidental CT findings: Advanced bilateral carotid artery calcifications.  CHEST: Numerous patchy E irregular nodular densities in both lungs demonstrating FDG uptake. These are  new since the prior recent chest CT from 10/04/2023 and likely represent an inflammatory or infectious process. Somewhat rounded nodular density in the right upper lobe on image 67/4 has an SUV max of 6.2 but even in retrospect I do not see any abnormality on the prior chest CT from 2 months ago. Small scattered pulmonary nodules are stable. No enlarged or hypermetabolic mediastinal hilar lymph nodes.  There is a hypermetabolic focus noted in the left supraclavicular region which is most likely a thyroid  lesion. Difficult to see a discrete nodule or lesion on the CT scan but recommend thyroid  ultrasound for further evaluation.  Surgical changes involving the left breast. No hypermetabolic breast lesions or chest wall lesions. No axillary adenopathy.  Incidental CT findings: Stable advanced vascular disease and stable significant underlying emphysematous lung changes and pulmonary scarring.  ABDOMEN/PELVIS: 1 small focus of hypermetabolism adjacent to the right hepatic lobe posteriorly has an SUV max of 4.5. This could be a peritoneal implant but no obvious CT abnormality. 9 mm nodule adjacent to the IVC on image 130/4 is hypermetabolic with SUV max of 8.9. This is consistent with a neoplastic node or implant. I do not see any other hypermetabolic peritoneal omental lesions. Scattered bowel activity is typical. No findings for metastatic disease involving the liver, spleen, pancreas or adrenal glands.  Incidental CT findings: Stable advanced vascular disease. Remote surgical changes involving the bowel. Bilateral renal cysts are stable.  SKELETON: No findings suspicious for osseous metastatic disease.  Incidental CT findings: Left total hip arthroplasty without complicating features. Severe right hip joint degenerative changes. Severe lumbar facet disease.  IMPRESSION: 1. Numerous patchy irregular nodular densities in both lungs demonstrating FDG uptake. These are new since  the prior chest CT from 10/04/2023 and likely represent an inflammatory or infectious process. Recommend correlation with clinical findings and follow-up noncontrast chest CT in 3 months to reassess. 2. No enlarged or hypermetabolic mediastinal or hilar lymph nodes. 3. Small focus of hypermetabolism adjacent to the right hepatic lobe posteriorly suspicious for peritoneal implant. There is also a 9 mm hypermetabolic retroperitoneal nodule. 4. No findings for osseous metastatic disease. 5. Hypermetabolic focus in the left supraclavicular region is most likely a thyroid  lesion. Recommend follow-up thyroid  ultrasound. 6. Advanced vascular disease.  Aortic Atherosclerosis (ICD10-I70.0).   Electronically Signed By: MYRTIS Stammer M.D. On: 12/15/2023 13:28

## 2024-03-18 ENCOUNTER — Other Ambulatory Visit: Payer: Self-pay

## 2024-03-18 ENCOUNTER — Ambulatory Visit: Payer: Self-pay | Admitting: Psychiatry

## 2024-03-18 LAB — CA 125: Cancer Antigen (CA) 125: 31.2 U/mL (ref 0.0–38.1)

## 2024-03-18 NOTE — Telephone Encounter (Signed)
-----   Message from [place], [place] sent at 03/18/2024  7:38 AM EST ----- Please let the patient know that her CA125 is in the normal range. We will follow-up the results from her next PET scan. ----- Message ----- From: Interface, Lab In [place] Sent: 03/18/2024   7:37 AM EST To: Hoy Masters, MD

## 2024-03-18 NOTE — Telephone Encounter (Signed)
 Catherine Lowery is aware of normal CA125 result as reported by Dr.Newton

## 2024-03-18 NOTE — Progress Notes (Signed)
 Specialty Pharmacy Ongoing Clinical Assessment Note  Catherine Lowery is a 78 y.o. female who is being followed by the specialty pharmacy service for RxSp Oncology   Patient's specialty medication(s) reviewed today: Abemaciclib  (VERZENIO )   Missed doses in the last 4 weeks: 0   Patient/Caregiver did not have any additional questions or concerns.   Therapeutic benefit summary: Patient is achieving benefit   Adverse events/side effects summary: Experienced adverse events/side effects (ongoing diarrhea, pt uses Imodium PRN which helps)   Patient's therapy is appropriate to: Continue    Goals Addressed             This Visit's Progress    Maintain optimal adherence to therapy   On track    Patient is on track. Patient will maintain adherence          Follow up: 3 months  Silvano LOISE Dolly Specialty Pharmacist

## 2024-03-21 ENCOUNTER — Ambulatory Visit (HOSPITAL_COMMUNITY)
Admission: RE | Admit: 2024-03-21 | Discharge: 2024-03-21 | Disposition: A | Discharge status: Home / Self Care | Source: Ambulatory Visit | Attending: Hematology and Oncology | Admitting: Hematology and Oncology

## 2024-03-21 DIAGNOSIS — Z170 Estrogen receptor positive status [ER+]: Secondary | ICD-10-CM | POA: Insufficient documentation

## 2024-03-21 DIAGNOSIS — C50311 Malignant neoplasm of lower-inner quadrant of right female breast: Secondary | ICD-10-CM | POA: Diagnosis present

## 2024-03-21 DIAGNOSIS — C799 Secondary malignant neoplasm of unspecified site: Secondary | ICD-10-CM | POA: Diagnosis present

## 2024-03-21 LAB — GLUCOSE, CAPILLARY: Glucose-Capillary: 90 mg/dL (ref 70–99)

## 2024-03-21 MED ORDER — FLUDEOXYGLUCOSE F - 18 (FDG) INJECTION
7.3400 | Freq: Once | INTRAVENOUS | Status: AC | PRN
  Administered 2024-03-21: 7.34 via INTRAVENOUS

## 2024-03-28 ENCOUNTER — Inpatient Hospital Stay: Admitting: Hematology and Oncology

## 2024-03-28 ENCOUNTER — Inpatient Hospital Stay

## 2024-03-28 ENCOUNTER — Telehealth: Payer: Self-pay

## 2024-03-28 ENCOUNTER — Other Ambulatory Visit: Payer: Self-pay

## 2024-03-28 ENCOUNTER — Other Ambulatory Visit (HOSPITAL_COMMUNITY): Payer: Self-pay

## 2024-03-28 VITALS — BP 92/58 | HR 48 | Temp 97.6°F | Resp 14 | Ht 69.0 in | Wt 148.2 lb

## 2024-03-28 DIAGNOSIS — D61818 Other pancytopenia: Secondary | ICD-10-CM

## 2024-03-28 DIAGNOSIS — C569 Malignant neoplasm of unspecified ovary: Secondary | ICD-10-CM | POA: Diagnosis not present

## 2024-03-28 DIAGNOSIS — C50311 Malignant neoplasm of lower-inner quadrant of right female breast: Secondary | ICD-10-CM | POA: Diagnosis not present

## 2024-03-28 DIAGNOSIS — Z170 Estrogen receptor positive status [ER+]: Secondary | ICD-10-CM | POA: Diagnosis not present

## 2024-03-28 DIAGNOSIS — C562 Malignant neoplasm of left ovary: Secondary | ICD-10-CM

## 2024-03-28 LAB — CMP (CANCER CENTER ONLY)
ALT: 24 U/L (ref 0–44)
AST: 32 U/L (ref 15–41)
Albumin: 3.8 g/dL (ref 3.5–5.0)
Alkaline Phosphatase: 84 U/L (ref 38–126)
Anion gap: 10 (ref 5–15)
BUN: 20 mg/dL (ref 8–23)
CO2: 19 mmol/L — ABNORMAL LOW (ref 22–32)
Calcium: 9.2 mg/dL (ref 8.9–10.3)
Chloride: 112 mmol/L — ABNORMAL HIGH (ref 98–111)
Creatinine: 1.17 mg/dL — ABNORMAL HIGH (ref 0.44–1.00)
GFR, Estimated: 47 mL/min — ABNORMAL LOW (ref >60)
Glucose, Bld: 96 mg/dL (ref 70–99)
Potassium: 4 mmol/L (ref 3.5–5.1)
Sodium: 141 mmol/L (ref 135–145)
Total Bilirubin: 0.3 mg/dL (ref 0.0–1.2)
Total Protein: 7.9 g/dL (ref 6.5–8.1)

## 2024-03-28 LAB — CBC WITH DIFFERENTIAL/PLATELET
Abs Immature Granulocytes: 0 K/uL (ref 0.00–0.07)
Basophils Absolute: 0 K/uL (ref 0.0–0.1)
Basophils Relative: 1 %
Eosinophils Absolute: 0.1 K/uL (ref 0.0–0.5)
Eosinophils Relative: 3 %
HCT: 25.5 % — ABNORMAL LOW (ref 36.0–46.0)
Hemoglobin: 8.4 g/dL — ABNORMAL LOW (ref 12.0–15.0)
Immature Granulocytes: 0 %
Lymphocytes Relative: 36 %
Lymphs Abs: 1.1 K/uL (ref 0.7–4.0)
MCH: 25.4 pg — ABNORMAL LOW (ref 26.0–34.0)
MCHC: 32.9 g/dL (ref 30.0–36.0)
MCV: 77 fL — ABNORMAL LOW (ref 80.0–100.0)
Monocytes Absolute: 0.3 K/uL (ref 0.1–1.0)
Monocytes Relative: 11 %
Neutro Abs: 1.5 K/uL — ABNORMAL LOW (ref 1.7–7.7)
Neutrophils Relative %: 49 %
Platelets: 116 K/uL — ABNORMAL LOW (ref 150–400)
RBC: 3.31 MIL/uL — ABNORMAL LOW (ref 3.87–5.11)
RDW: 14.3 % (ref 11.5–15.5)
WBC: 3 K/uL — ABNORMAL LOW (ref 4.0–10.5)
nRBC: 0 % (ref 0.0–0.2)

## 2024-03-28 LAB — SAMPLE TO BLOOD BANK

## 2024-03-28 LAB — FERRITIN: Ferritin: 301 ng/mL (ref 11–307)

## 2024-03-28 LAB — TSH: TSH: 2.28 u[IU]/mL (ref 0.350–4.500)

## 2024-03-28 MED ORDER — INAVOLISIB 3 MG PO TABS
6.0000 mg | ORAL_TABLET | Freq: Every day | ORAL | 1 refills | Status: DC
  Filled 2024-03-28 – 2024-04-07 (×2): qty 56, 28d supply, fill #0
  Filled 2024-05-07: qty 56, 28d supply, fill #1

## 2024-03-28 MED ORDER — PALBOCICLIB 125 MG PO CAPS
125.0000 mg | ORAL_CAPSULE | Freq: Every day | ORAL | 1 refills | Status: DC
  Filled 2024-04-07: qty 21, 28d supply, fill #0

## 2024-03-28 NOTE — Telephone Encounter (Signed)
 Oral Oncology Patient Advocate Encounter   Received notification that prior authorization for palbociclib (IBRANCE) 125 MG capsule  is required.   PA submitted on 03/28/24 Key [id number] Status is pending     Lucie Lamer, CPhT [place]  [facility] Specialty Pharm[facility] Services Oncology Pharmacy Patient Advocate Specialist II [facility] [place] Phone: [phone]  Fax: [phone] [email or web address]

## 2024-03-28 NOTE — Telephone Encounter (Signed)
 Oral Oncology Patient Advocate Encounter   Received notification that prior authorization for ITOVEBI   is required.   PA submitted on 03/28/2024 Key [id number] Status is pending      Charlott [place],  CPhT-Adv  she/her/hers [facility]  [facility] Specialty Pharmacy Services Pharmacy Technician Patient Advocate Specialist III [facility] Phone: [phone]  Fax: [phone] [email or web address]

## 2024-03-28 NOTE — Progress Notes (Signed)
 [facility]  [phone] [phone]     ID: Catherine Lowery DOB: Jan 29, 1946  MR#: [id number]  RDW#:[id number]  Patient Care Team: [name], NP as PCP - General (Nurse Practitioner) Mona Vinie BROCKS, MD as PCP - Cardiology (Cardiology) Shannon Agent, MD as Consulting Physician (Radiation Oncology) Beverley Evalene BIRCH, MD as Attending Physician (Orthopedic Surgery) Margaret Eduard SAUNDERS, MD as Consulting Physician (Neurology) Nicholaus Sherlean CROME, [facility] (Inactive) as Pharmacist (Pharmacist)   CHIEF COMPLAINT: Metastatic breast cancer  CURRENT TREATMENT: None  INTERVAL HISTORY: Discussed the use of AI scribe software for clinical note transcription with the patient, who gave verbal consent to proceed.  History of Present Illness  Catherine Lowery is an 78 year old female with metastatic cancer who presents with diarrhea and medication side effects.  She experiences significant diarrhea, which she attributes to her cancer medication, Verzenio , taken at a dose of 100 mg twice daily. The diarrhea occurs twice a day, starting at 1 AM and lasting throughout the day, necessitating the use of up to four Imodium tablets daily, especially when she needs to leave the house. The diarrhea is severe enough to require changing her bed multiple times in the morning.  She is also taking an iron tablet, which worsens diarrhea, leading her to adjust the frequency to every two or three days.  She is currently taking letrozole  daily as well.  Her blood pressure readings have been 92/58 mmHg and 126/48 mmHg. No associated symptoms such as difficulty breathing or pain.  She mentions swelling in her feet, which improves after a night's rest but worsens with activity during the day.    COVID 19 VACCINATION STATUS: Pfizer x4, last 10/2020   Breast cancer History  Catherine Lowery had routine screening mammography on 02/26/2018 showing a possible abnormality in the right  breast. She underwent unilateral right diagnostic mammography with tomography and right breast ultrasonography at [facility] on 03/07/2018 showing: Breast Density Category B. On mammography, there is a new 1.5 cm irregular architectural distortion with a spiculated margin in the right breast at 5 o'clock anterior depth. On ultrasound, there is a 2.7 cm irregular solid mass in the right breast at 5 o'clock middle depth. This irregular mass displays posterior acoustic shadowing. No significant abnormalities were seen sonographically in the right axilla.  Accordingly on 03/18/2018 she proceeded to biopsy of the right breast area in question. The pathology from this procedure showed ([id number]): Invasive mammary carcinoma e-cadherin negative, grade II-III. 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 Prognostic indicators significant for: estrogen receptor, 95% positive and progesterone receptor, 95% positive, both with strong staining intensity. Proliferation marker Ki67 at 5%. HER2 negative by immunohistochemistry (1+).  On 04/10/2018 the patient underwent bilateral breast MRI with and without contrast showing a 2.3 cm spiculated enhancing mass associated with non-masslike enhancement, the total abnormal area measuring 4.6 cm.  There was no evidence of lymph node abnormality and the contralateral breast was benign appearing  The patient's subsequent history is as detailed below.   PAST MEDICAL HISTORY: Past Medical History:  Diagnosis Date   Cancer (HCC) 03/2018   right breast cancer    ovarian   Chronic kidney disease    CKD   COPD (chronic obstructive pulmonary disease) (HCC)    DJD (degenerative joint disease)    LEFT HIP   Family history of breast cancer    Family history of ovarian cancer    Family history of prostate cancer  Family history of prostate cancer    H/O migraine 07/01/2019   Hyperlipidemia    takes Simvasatin daily   Hypertension    takes Propranolol  and  HCTZ daioly   Hyperthyroidism    Pneumonia    x2   PONV (postoperative nausea and vomiting)    when ether used   Reactive airway disease 05/08/2023   Tremors of nervous system    takes Primidone  daily    PAST SURGICAL HISTORY: Past Surgical History:  Procedure Laterality Date   BOne Spur     left heel   BOWEL RESECTION N/A 08/21/2023   Procedure: SMALL BOWEL RESECTION WITH SIDE-TO-SIDE, FUNCTIONAL END-TO-END ANASTOMOSIS;  Surgeon: Eldonna Mays, MD;  Location: [facility];  Service: Gynecology;  Laterality: N/A;   BREAST LUMPECTOMY WITH RADIOACTIVE SEED AND SENTINEL LYMPH NODE BIOPSY Right 05/07/2018   Procedure: RIGHT BREAST LUMPECTOMY WITH RADIOACTIVE SEED AND RIGHT  SENTINEL LYMPH NODE MAPPING;  Surgeon: Vanderbilt Ned, MD;  Location: [facility];  Service: General;  Laterality: Right;   COLONOSCOPY     IR IMAGING GUIDED PORT INSERTION  05/10/2023   IR US  GUIDE BX ASP/DRAIN  05/10/2023   JOINT REPLACEMENT Bilateral    knee   KNEE SURGERY Left    lateral muscle release in [employer]   OMENTECTOMY N/A 08/21/2023   Procedure: OMENTECTOMY;  Surgeon: Eldonna Mays, MD;  Location: [facility];  Service: Gynecology;  Laterality: N/A;  via robot approach   RE-EXCISION OF BREAST LUMPECTOMY Right 06/05/2018   Procedure: RE-EXCISION OF RIGHT BREAST LUMPECTOMY;  Surgeon: Vanderbilt Ned, MD;  Location: [facility];  Service: General;  Laterality: Right;   ROBOTIC ASSISTED TOTAL HYSTERECTOMY WITH BILATERAL SALPINGO OOPHERECTOMY N/A 08/21/2023   Procedure: ROBOTIC ASSISTED TOTAL LAPAROSCOPY HYSTERECTOMY WITH BILATERAL SALPINGO-OOPHORECTOMY, RADICAL DISSECTION FOR DEBULKING;  Surgeon: Eldonna Mays, MD;  Location: [facility];  Service: Gynecology;  Laterality: N/A;   ROTATOR CUFF REPAIR Bilateral    TOTAL HIP ARTHROPLASTY Left 12/29/2015   Procedure: TOTAL HIP ARTHROPLASTY ANTERIOR APPROACH;  Surgeon: Toribio JULIANNA Chancy, MD;  Location: [facility];  Service: Orthopedics;  Laterality:  Left;    FAMILY HISTORY Family History  Problem Relation Age of Onset   Hypertension Mother    Heart disease Mother    Stroke Mother    Multiple myeloma Father 58   Thyroid  cancer Sister        dx 56's/50's   Cancer Paternal Aunt        unknown   Prostate cancer Paternal Uncle        dx >50- cancer was the cause of his death   Ovarian cancer Paternal Aunt    Throat cancer Paternal Uncle    Breast cancer Cousin        dx >50   Cancer Cousin        type unk   Cancer Cousin        type unk   Cancer Cousin        type unk  She notes that her father died from a multiple myeloma at age 73. Patients' mother died from stroke at age 3. The patient has 1 brother and 2 sisters. One paternal aunt had ovarian cancer.  One cousin had breast cancer, and her 1s. Another paternal aunt had cancer, type unknown to the patient, and a paternal uncle had prostate cancer.    GYNECOLOGIC HISTORY:  Menarche: 78 years old Age at first live birth: 78 years old GX P: 1 LMP: Patient's last  menstrual period was 01/24/2000. 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 Before that, she has been employed at [employer], [employer], and as an elderly caregiver. 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 She is separated, and has not spoken to her husband, Earlie Schultze, in over 10 years. She lives with her only son, Bennett Vanscyoc, who is disabled secondary to SCD and multiple surgeries. She has no pets. She attends [employer].    ADVANCED DIRECTIVES: Her sister, Velia Muskrat, is Cloris's medical power of attorney. Velia can be reached at [phone]. Polka has stated that her husband, Earlie Schultze, is not to have access to her medical records or to make decisions on her behalf. ]   HEALTH  MAINTENANCE: Social History   Tobacco Use   Smoking status: Former    Current packs/day: 0.75    Average packs/day: 0.8 packs/day for 28.0 years (21.0 ttl pk-yrs)    Types: Cigarettes    Passive exposure: Never   Smokeless tobacco: Never   Tobacco comments:    02/13/22 Pt stated I'm trying to quit Archie JONELLE Che, CMA     Quit February 2025  Vaping Use   Vaping status: Never Used  Substance Use Topics   Alcohol use: No    Alcohol/week: 0.0 standard drinks of alcohol   Drug use: No     Colonoscopy: 01/2015  PAP:   Bone density:  02/26/2018: T-score 1.4   Allergies  Allergen Reactions   Morphine And Codeine Itching and Nausea And Vomiting   Percocet [Oxycodone -Acetaminophen ] Itching and Nausea And Vomiting    Current Outpatient Medications  Medication Sig Dispense Refill   abemaciclib  (VERZENIO ) 100 MG tablet Take 1 tablet (100 mg total) by mouth 2 (two) times daily. 56 tablet 2   albuterol  (VENTOLIN  HFA) 108 (90 Base) MCG/ACT inhaler Inhale 2 puffs into the lungs every 4 (four) hours as needed for wheezing or shortness of breath.     amLODipine  (NORVASC ) 10 MG tablet Take 1 tablet (10 mg total) by mouth daily. 90 tablet 1   aspirin  (ASPIRIN  ADULT) 325 MG tablet enteric coated; may use OTC 30 tablet 0   atorvastatin  (LIPITOR) 40 MG tablet Take 1 tablet (40 mg total) by mouth daily. 90 tablet 1   letrozole  (FEMARA ) 2.5 MG tablet Take 1 tablet (2.5 mg total) by mouth daily. 90 tablet 3   levothyroxine  (SYNTHROID ) 50 MCG tablet Take 1 tablet (50 mcg total) by mouth daily. 90 tablet 3   lidocaine -prilocaine  (EMLA ) cream Apply to affected area once 30 g 3   losartan  (COZAAR ) 25 MG tablet Take 25 mg by mouth daily.     Multiple Vitamin (MULTIVITAMIN WITH MINERALS) TABS tablet Take 1 tablet by mouth daily.     ondansetron  (ZOFRAN ) 8 MG tablet Take 1 tablet (8 mg total) by mouth every 8 (eight) hours as needed for nausea or vomiting. Start on the third day after carboplatin .  30 tablet 1   POTASSIUM PO Take 1 tablet by mouth daily.     primidone  (MYSOLINE ) 50 MG tablet Take 2 tablets (100 mg total) by mouth every morning. 180 tablet 2   prochlorperazine  (COMPAZINE ) 10 MG tablet Take 1 tablet (10 mg total) by mouth every 6 (six) hours as needed for nausea or  vomiting. 30 tablet 1   propranolol  (INDERAL ) 40 MG tablet Take 0.5 tablets (20 mg total) by mouth 2 (two) times daily. 90 tablet 1   VOLTAREN 1 % GEL APPLY TOPICALLY 4 TIMES DAILY AS NEEDED 100 g 0   No current facility-administered medications for this visit.    OBJECTIVE: African-American woman in no acute distress  Vitals:   03/28/24 0800 03/28/24 0906  BP: (!) 126/48 (!) 92/58  Pulse: (!) 48   Resp: 14   Temp: 97.6 F (36.4 C)   SpO2: 99%      Wt Readings from Last 3 Encounters:  03/28/24 148 lb 3.2 oz (67.2 kg)  03/17/24 148 lb (67.1 kg)  02/14/24 151 lb 6.4 oz (68.7 kg)   Body mass index is 21.89 kg/m.    ECOG FS:1 - Symptomatic but completely ambulatory   Physical Exam Constitutional:      Appearance: Normal appearance.  Cardiovascular:     Rate and Rhythm: Normal rate and regular rhythm.  Pulmonary:     Effort: Pulmonary effort is normal.     Breath sounds: Normal breath sounds.  Abdominal:     General: Abdomen is flat.     Palpations: Abdomen is soft.  Musculoskeletal:        General: No swelling or tenderness.     Cervical back: Normal range of motion. No rigidity.  Lymphadenopathy:     Cervical: No cervical adenopathy.  Skin:    General: Skin is warm and dry.  Neurological:     Mental Status: She is alert and oriented to person, place, and time.  Psychiatric:        Mood and Affect: Mood normal.     LAB RESULTS:  CMP     Component Value Date/Time   NA 141 02/14/2024 0805   NA 139 11/22/2020 0000   K 4.0 02/14/2024 0805   CL 111 02/14/2024 0805   CO2 25 02/14/2024 0805   GLUCOSE 114 (H) 02/14/2024 0805   BUN 20 02/14/2024 0805   CREATININE 1.26 (H)  02/14/2024 0805   CREATININE 1.05 (H) 05/19/2019 1034   CALCIUM  9.2 02/14/2024 0805   PROT 7.2 02/14/2024 0805   ALBUMIN  3.5 02/14/2024 0805   AST 26 02/14/2024 0805   ALT 20 02/14/2024 0805   ALKPHOS 73 02/14/2024 0805   BILITOT 0.3 02/14/2024 0805   GFRNONAA 43 (L) 02/14/2024 0805   GFRNONAA 48 (L) 01/23/2018 0804   GFRAA >60 06/23/2019 1524   GFRAA 56 (L) 01/23/2018 0804    No results found for: TOTALPROTELP, ALBUMINELP, A1GS, A2GS, BETS, BETA2SER, GAMS, MSPIKE, SPEI  No results found for: KPAFRELGTCHN, LAMBDASER, KAPLAMBRATIO  Lab Results  Component Value Date   WBC 3.0 (L) 03/28/2024   NEUTROABS 1.5 (L) 03/28/2024   HGB 8.4 (L) 03/28/2024   HCT 25.5 (L) 03/28/2024   MCV 77.0 (L) 03/28/2024   PLT 116 (L) 03/28/2024   No results found for: LABCA2  No components found for: OJARJW874  No results for input(s): INR in the last 168 hours.  No results found for: [facility]  Lab Results  Component Value Date   RJW800 55 (H) 07/26/2023    Lab Results  Component Value Date   CAN125 31.2 03/17/2024    No results found for: RJW846  Lab Results  Component Value Date   CA2729 34.9 09/18/2023    No components found for: HGQUANT  Lab Results  Component Value Date   CEA1 3.4 05/09/2023   /  CEA  Date Value Ref Range Status  05/09/2023 3.4 0.0 - 4.7 ng/mL Final    Comment:    (NOTE)                             Nonsmokers          <3.9                             Smokers             <5.6 Roche [employer]ostics Electrochemiluminescence Immunoassay (ECLIA) Values obtained with different assay methods or kits cannot be used interchangeably.  Results cannot be interpreted as absolute evidence of the presence or absence of malignant disease. Performed At: [facility] [address] [place], [name] [id number] Jennette Shorter MD Ey:[phone]      No results found for: AFPTUMOR  No results found for: [facility]  Lab  Results  Component Value Date   HGBA 70.8 (L) 06/23/2019   HGBA2QUANT 3.6 (H) 06/23/2019   HGBFQUANT 0.0 06/23/2019   HGBSQUAN 25.6 (H) 06/23/2019   (Hemoglobinopathy evaluation)   Lab Results  Component Value Date   LDH 147 06/23/2019    Lab Results  Component Value Date   IRON 59 05/09/2023   TIBC 228 (L) 05/09/2023   IRONPCTSAT 26 05/09/2023   (Iron and TIBC)  Lab Results  Component Value Date   FERRITIN 263 05/09/2023    Urinalysis    Component Value Date/Time   COLORURINE YELLOW 05/09/2023 0225   APPEARANCEUR CLEAR 05/09/2023 0225   LABSPEC >1.046 (H) 05/09/2023 0225   PHURINE 5.0 05/09/2023 0225   GLUCOSEU NEGATIVE 05/09/2023 0225   HGBUR SMALL (A) 05/09/2023 0225   BILIRUBINUR NEGATIVE 05/09/2023 0225   KETONESUR NEGATIVE 05/09/2023 0225   PROTEINUR 100 (A) 05/09/2023 0225   UROBILINOGEN 0.2 03/24/2011 1054   NITRITE NEGATIVE 05/09/2023 0225   LEUKOCYTESUR NEGATIVE 05/09/2023 0225    STUDIES:  NM PET Image Restage (PS) Skull Base to Thigh (F-18 FDG) Result Date: 03/26/2024 CLINICAL DATA:  Subsequent treatment strategy for metastatic breast cancer. EXAM: NUCLEAR MEDICINE PET SKULL BASE TO THIGH TECHNIQUE: 7.3 mCi F-18 FDG was injected intravenously. Full-ring PET imaging was performed from the skull base to thigh after the radiotracer. CT data was obtained and used for attenuation correction and anatomic localization. Fasting blood glucose: 90 mg/dl COMPARISON:  91/92/7974 FINDINGS: Mediastinal blood pool activity: SUV max 2.1 Liver activity: SUV max NA NECK: No hypermetabolic lymph nodes in the neck. Incidental CT findings: None. CHEST: Hypermetabolic focus in the left thoracic inlet may be a lymph node or thyroid  nodule. This remains hypermetabolic with SUV max = 6.0 today compared to 6.6 previously. Advanced changes of emphysema are associated with areas of architectural distortion and scarring bilaterally. Ill-defined nodular density in the parahilar right  lung (image 30/series 7) measures 2.2 x 1.1 cm, increased from 1.3 x 0.9 cm previously. SUV max = 7.1 today compared to 6.2 previously. Ill-defined consolidative nodular opacity in the peripheral right lung on image 28/7 is also progressive in the interval with low level hypermetabolism. Incidental CT findings: Right Port-A-Cath tip is positioned at the upper right atrium. Moderate atherosclerotic calcification is noted in the wall of the thoracic aorta. Coronary artery calcification is evident. Tiny right pleural effusion is similar to prior. ABDOMEN/PELVIS: No abnormal hypermetabolic activity within the liver, pancreas, adrenal glands, or spleen. Soft tissue nodule along the posterior  right liver capsule identified previously is progressive in the interval measuring 2.2 x 1.5 cm today on image 104/4 with SUV max = 9.2 (4.5 previously). Previously reported nodular focus of hypermetabolism in the region of the splenic flexure is progressive with SUV max = 7.8 today compared to 4.4 previously. New hypermetabolic retroperitoneal lymph nodes are seen in the abdomen on image 110/4 with SUV max = 6.9. 12 mm right omental nodule on 118/4 is new since prior with SUV max = 6.7. 11 mm mesenteric nodule anterior to the left psoas muscle on 135/4 is hypermetabolic with SUV max = 9.3, new since prior. There is also a new mesenteric nodule with hypermetabolism in the posterior pelvis on 148/4. Incidental CT findings: There is moderate atherosclerotic calcification of the abdominal aorta without aneurysm. Bilateral renal cysts evident. 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 SKELETON: There is a new hypermetabolic focus identified in a midthoracic vertebral body, potentially T6, with SUV max = 7.5. new hypermetabolic focus identified posterior left iliac bone with SUV max = 4.1. There is no discernible lesion at either of these locations on CT imaging. Incidental CT findings: Degenerative changes noted in the thoracolumbar  spine. IMPRESSION: 1. Interval progression of disease with new hypermetabolic retroperitoneal lymph nodes and new hypermetabolic omental/mesenteric nodules. 2. New hypermetabolic foci in the midthoracic spine and posterior left iliac bone without discernible lesion on CT imaging. Imaging findings concerning for new bony metastatic involvement. 3. Interval increase in size of 2 ill-defined nodular densities in the right lung with persistent hypermetabolism. Metastatic disease a concern. 4. Stable hypermetabolic focus in the left thoracic inlet, potentially a lymph node or thyroid  nodule. Attention on follow-up recommended. 5. Aortic Atherosclerosis (ICD10-I70.0) and Emphysema (ICD10-J43.9). Electronically Signed   By: Camellia Candle M.D.   On: 03/26/2024 08:49       ELIGIBLE FOR AVAILABLE RESEARCH PROTOCOL: no   ASSESSMENT: 78 y.o. [name], [name] woman status post right breast lower inner quadrant biopsy 03/18/2018 for a clinical T2N0, [name], grade 2 or 3, estrogen and progesterone receptor positive, [name], with an MIB-1 of 5%  (1) Genetic testing performed through [facility] Common Hereditary Cancers Panel + Thyroid  Cancer Panel on 05/09/2018 showing no deleterious mutations APC, [name], [name], [name], [name], [name], [name], [name], [name], [name], [name], [name], [name], [name], [name], [name], [name], [name], [name], [name], [name], KIT, MEN1, [name], [name], [name], [name], [name], [name], [name], [name], [name], [name], [name], [name], [name], POLE, [name], [name], RAD50, [name], [name], [name], [name], [name], [name], [name], [name], [name], [name], [name], [name], [name], [name], [name], VHL.   (a) Two variants of uncertain significance in the genes [name] c.1176C>T (Silent) and [name] c.1419+4C>T (Intronic) were identified.  (2) right lumpectomy and sentinel lymph node sampling 05/07/2018 showed a pT2 pN1, [name], grade 2, with a positive inferior  margin  (a) reexcision scheduled for 06/05/2018  (3) MammaPrint obtained from the definitive surgical sample read as low risk predicting a disease-free survival at 5 years of 97.8% without need of chemotherapy.  (4) adjuvant radiation 07/11/2018 - 08/27/2018  (a) 1. Right breast; [name]; 25 fractions of 1.8 Gy for a total of 45 Gy  3. Boost; 6 fractions of 2 Gy for a total of 12 Gy  (5) anastrozole  started 10/02/2018  (a) bone density 05/05/2021, [name], normal imits       (6) genetic testing 05/18/2018 through the Common Hereditary Cancers Panel + Thyroid  Caner panel showed no deleterious mutations in APC, [name], [name], [name], [name], [name], [name], [name], [name], [name], [name], [name], [name], [name], [name], [name], [name], [name], [name], [name], [name], KIT, MEN1, [name], [name], [name], [name], [name], [name], [name], [name], [name], [name], [name], [name], [name], POLE, [name], [name], RAD50, [name], [name], [name], [name], [name], [name], [name], [name], [name], [name], [name], [name], [name], [name], [name], VHL.  (a) 2 variants of uncertain significance in the genes [name] c.1176C>T (Silent) and [name] c.1419+4C>T (Intronic) were identified.    (b) UPDATE: The [name] c.1419+4C>T (Intronic) VUS has been reclassified to Likely Benign. The report date is 08/08/2018.  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 Current treatment with Verzenio  and letrozole  is ineffective. - Initiate new treatment regimen with Inavolisib, Ibrance and faslodex since this is endocrine refractory. - Scheduled Faslodex injections: first  three times every two weeks, then monthly. - Will discuss new medication regimen with pharmacist upon return on Monday.  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 Iron supplementation causes diarrhea, limiting its use. - Will consider iron infusion if anemia worsens.  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 - Reduced Verzenio  dose to 50 mg twice daily until we switch the regimen.  Peripheral edema Noted, particularly after prolonged activity. Swelling improves with rest. - Continue to monitor edema and manage with elevation of legs if needed.   Total time spent: 40 minutes.  *Total Encounter Time as defined by the Centers for Medicare and Medicaid Services includes, in addition to the face-to-face time of a patient visit (documented in the note above) non-face-to-face time: obtaining and reviewing outside history, ordering and reviewing medications, tests or procedures, care coordination (communications with other health care professionals or caregivers) and documentation in the medical record.

## 2024-03-28 NOTE — Telephone Encounter (Signed)
 Oral Oncology Patient Advocate Encounter  Prior Authorization for palbociclib FREDIRICK) 125 MG capsule  has been approved.    PA# [id number] Effective dates: 05/02/23 through 04/30/25  Patients co-pay is $0.00.    Lucie Lamer, CPhT [place]  [facility] Oncology Pharmacy Patient Advocate Specialist [facility] Phone: [phone]  Fax: [phone] [email or web address]

## 2024-03-31 ENCOUNTER — Telehealth: Payer: Self-pay | Admitting: Pharmacist

## 2024-03-31 ENCOUNTER — Other Ambulatory Visit (HOSPITAL_COMMUNITY): Payer: Self-pay

## 2024-03-31 ENCOUNTER — Other Ambulatory Visit: Payer: Self-pay | Admitting: Pharmacist

## 2024-03-31 DIAGNOSIS — R739 Hyperglycemia, unspecified: Secondary | ICD-10-CM

## 2024-03-31 NOTE — Telephone Encounter (Signed)
 [facility]        Telephone: [phone]?Fax: [phone]   Oncology Clinical Pharmacist Practitioner Encounter   Received new prescription for inavolisib and palbociclib for the treatment of breast cancer. This is being given in combination with fulvestrant. It is planned to continue until disease progression or unacceptable toxicity.  Labs from 03/28/24 assessed and will obtain baseline HbA1c at next visit. Prescription dose and frequency assessed. Dr. Loretha starting at a lower dose of inavolisib (6 mg).  Current medication list in Epic reviewed. Significant DDIs with inavolisib and palbociclib identified:Yes. Primidone  may lower concentrations of palbociclib. [name] may increase concentrations of atorvastatin . Dr. Loretha made aware.  Evaluated chart. Patient barriers to medication adherence identified: No.  Patient agreement for treatment documented in physician note on 03/28/24.  Prescription has been e-scribed to the [facility]) for benefits analysis and approval.  Oral Oncology Clinic will continue to follow for insurance authorization, copayment issues, initial counseling and start date.  Atharv Barriere A. Lucila, PharmD, BCOP, CPP Hematology-Oncology Clinical Pharmacist Practitioner  03/31/2024 12:19 PM  **Disclaimer: This note was dictated with voice recognition software. Similar sounding words can inadvertently be transcribed and this note may contain transcription errors which may not have been corrected upon publication of note.**

## 2024-03-31 NOTE — Telephone Encounter (Signed)
 Oral Oncology Patient Advocate Encounter  Prior Authorization for  ITOVEBI   has been approved.    PA# [id number] Effective dates: 05/02/2023 through 04/30/2025  Patients co-pay is $0.00.  Patient would like home delivery Wednesday 04/09/2024    Charlott Hamilton,  CPhT-Adv  she/her/hers [place]  [facility] Pharmacy Technician Patient Advocate Specialist III [facility] Phone: [phone]  Fax: [phone] [email or web address]

## 2024-04-01 ENCOUNTER — Other Ambulatory Visit (HOSPITAL_COMMUNITY): Payer: Self-pay

## 2024-04-03 ENCOUNTER — Other Ambulatory Visit: Payer: Self-pay | Admitting: Pharmacist

## 2024-04-03 MED ORDER — ROSUVASTATIN CALCIUM 20 MG PO TABS: 20.0000 mg | ORAL_TABLET | Freq: Every day | ORAL | 1 refills | Status: AC

## 2024-04-03 NOTE — Telephone Encounter (Signed)
 Patient would like home delivery Wednesday 04/09/2024 for [facility]

## 2024-04-04 ENCOUNTER — Telehealth: Payer: Self-pay | Admitting: *Deleted

## 2024-04-04 NOTE — Telephone Encounter (Signed)
 Per Dr. Loretha, called pt to make aware to STOP atorvastatin . Advised that Dr. Loretha has sent a rx rosuvastatin  20 mg daily start. Pt verbalized understanding.

## 2024-04-07 ENCOUNTER — Other Ambulatory Visit (HOSPITAL_COMMUNITY): Payer: Self-pay

## 2024-04-07 ENCOUNTER — Inpatient Hospital Stay: Attending: Psychiatry | Admitting: Pharmacist

## 2024-04-07 ENCOUNTER — Other Ambulatory Visit: Payer: Self-pay | Admitting: Pharmacist

## 2024-04-07 ENCOUNTER — Other Ambulatory Visit: Payer: Self-pay

## 2024-04-07 ENCOUNTER — Inpatient Hospital Stay: Attending: Psychiatry

## 2024-04-07 VITALS — BP 130/48 | HR 51 | Temp 98.2°F | Resp 18 | Ht 69.0 in | Wt 150.0 lb

## 2024-04-07 DIAGNOSIS — Z803 Family history of malignant neoplasm of breast: Secondary | ICD-10-CM | POA: Diagnosis not present

## 2024-04-07 DIAGNOSIS — Z90710 Acquired absence of both cervix and uterus: Secondary | ICD-10-CM | POA: Diagnosis not present

## 2024-04-07 DIAGNOSIS — Z79899 Other long term (current) drug therapy: Secondary | ICD-10-CM | POA: Insufficient documentation

## 2024-04-07 DIAGNOSIS — Z79811 Long term (current) use of aromatase inhibitors: Secondary | ICD-10-CM | POA: Diagnosis not present

## 2024-04-07 DIAGNOSIS — R739 Hyperglycemia, unspecified: Secondary | ICD-10-CM | POA: Diagnosis not present

## 2024-04-07 DIAGNOSIS — C7800 Secondary malignant neoplasm of unspecified lung: Secondary | ICD-10-CM | POA: Diagnosis not present

## 2024-04-07 DIAGNOSIS — Z807 Family history of other malignant neoplasms of lymphoid, hematopoietic and related tissues: Secondary | ICD-10-CM | POA: Diagnosis not present

## 2024-04-07 DIAGNOSIS — Z8543 Personal history of malignant neoplasm of ovary: Secondary | ICD-10-CM | POA: Diagnosis not present

## 2024-04-07 DIAGNOSIS — Z170 Estrogen receptor positive status [ER+]: Secondary | ICD-10-CM | POA: Diagnosis not present

## 2024-04-07 DIAGNOSIS — Z5111 Encounter for antineoplastic chemotherapy: Secondary | ICD-10-CM | POA: Diagnosis present

## 2024-04-07 DIAGNOSIS — C786 Secondary malignant neoplasm of retroperitoneum and peritoneum: Secondary | ICD-10-CM | POA: Diagnosis not present

## 2024-04-07 DIAGNOSIS — T451X5A Adverse effect of antineoplastic and immunosuppressive drugs, initial encounter: Secondary | ICD-10-CM

## 2024-04-07 DIAGNOSIS — R634 Abnormal weight loss: Secondary | ICD-10-CM | POA: Diagnosis not present

## 2024-04-07 DIAGNOSIS — Z90722 Acquired absence of ovaries, bilateral: Secondary | ICD-10-CM | POA: Diagnosis not present

## 2024-04-07 DIAGNOSIS — C50311 Malignant neoplasm of lower-inner quadrant of right female breast: Secondary | ICD-10-CM | POA: Diagnosis present

## 2024-04-07 DIAGNOSIS — Z1721 Progesterone receptor positive status: Secondary | ICD-10-CM | POA: Insufficient documentation

## 2024-04-07 DIAGNOSIS — Z8041 Family history of malignant neoplasm of ovary: Secondary | ICD-10-CM | POA: Insufficient documentation

## 2024-04-07 DIAGNOSIS — C7951 Secondary malignant neoplasm of bone: Secondary | ICD-10-CM | POA: Diagnosis not present

## 2024-04-07 DIAGNOSIS — Z808 Family history of malignant neoplasm of other organs or systems: Secondary | ICD-10-CM | POA: Diagnosis not present

## 2024-04-07 DIAGNOSIS — C562 Malignant neoplasm of left ovary: Secondary | ICD-10-CM

## 2024-04-07 DIAGNOSIS — D61818 Other pancytopenia: Secondary | ICD-10-CM

## 2024-04-07 LAB — CBC WITH DIFFERENTIAL/PLATELET
Abs Immature Granulocytes: 0.01 K/uL (ref 0.00–0.07)
Basophils Absolute: 0.1 K/uL (ref 0.0–0.1)
Basophils Relative: 2 %
Eosinophils Absolute: 0.1 K/uL (ref 0.0–0.5)
Eosinophils Relative: 4 %
HCT: 26.5 % — ABNORMAL LOW (ref 36.0–46.0)
Hemoglobin: 8.9 g/dL — ABNORMAL LOW (ref 12.0–15.0)
Immature Granulocytes: 0 %
Lymphocytes Relative: 37 %
Lymphs Abs: 1.3 K/uL (ref 0.7–4.0)
MCH: 25.5 pg — ABNORMAL LOW (ref 26.0–34.0)
MCHC: 33.6 g/dL (ref 30.0–36.0)
MCV: 75.9 fL — ABNORMAL LOW (ref 80.0–100.0)
Monocytes Absolute: 0.3 K/uL (ref 0.1–1.0)
Monocytes Relative: 9 %
Neutro Abs: 1.8 K/uL (ref 1.7–7.7)
Neutrophils Relative %: 48 %
Platelets: 135 K/uL — ABNORMAL LOW (ref 150–400)
RBC: 3.49 MIL/uL — ABNORMAL LOW (ref 3.87–5.11)
RDW: 14 % (ref 11.5–15.5)
WBC: 3.7 K/uL — ABNORMAL LOW (ref 4.0–10.5)
nRBC: 0 % (ref 0.0–0.2)

## 2024-04-07 LAB — HEMOGLOBIN A1C
Hgb A1c MFr Bld: 4.9 % (ref 4.8–5.6)
Mean Plasma Glucose: 93.93 mg/dL

## 2024-04-07 LAB — CMP (CANCER CENTER ONLY)
ALT: 29 U/L (ref 0–44)
AST: 35 U/L (ref 15–41)
Albumin: 4.1 g/dL (ref 3.5–5.0)
Alkaline Phosphatase: 85 U/L (ref 38–126)
Anion gap: 11 (ref 5–15)
BUN: 22 mg/dL (ref 8–23)
CO2: 22 mmol/L (ref 22–32)
Calcium: 9.3 mg/dL (ref 8.9–10.3)
Chloride: 109 mmol/L (ref 98–111)
Creatinine: 1.18 mg/dL — ABNORMAL HIGH (ref 0.44–1.00)
GFR, Estimated: 46 mL/min — ABNORMAL LOW (ref >60)
Glucose, Bld: 117 mg/dL — ABNORMAL HIGH (ref 70–99)
Potassium: 3.9 mmol/L (ref 3.5–5.1)
Sodium: 142 mmol/L (ref 135–145)
Total Bilirubin: 0.3 mg/dL (ref 0.0–1.2)
Total Protein: 8.1 g/dL (ref 6.5–8.1)

## 2024-04-07 LAB — SAMPLE TO BLOOD BANK

## 2024-04-07 LAB — TSH: TSH: 2.22 u[IU]/mL (ref 0.350–4.500)

## 2024-04-07 MED ORDER — BLOOD GLUCOSE TEST VI STRP
ORAL_STRIP | 0 refills | Status: DC
  Filled 2024-04-07: qty 100, 90d supply, fill #0
  Filled 2024-04-10: qty 50, 90d supply, fill #0
  Filled 2024-05-24: qty 50, 90d supply, fill #1

## 2024-04-07 MED ORDER — ACCU-CHEK AVIVA PLUS W/DEVICE KIT
PACK | 0 refills | Status: AC
  Filled 2024-04-07: qty 1, 90d supply, fill #0
  Filled 2024-04-10: qty 1, 1d supply, fill #0

## 2024-04-07 MED ORDER — ONDANSETRON HCL 8 MG PO TABS
8.0000 mg | ORAL_TABLET | Freq: Three times a day (TID) | ORAL | 2 refills | Status: AC | PRN
  Filled 2024-04-07 – 2024-04-10 (×3): qty 30, 10d supply, fill #0

## 2024-04-07 MED ORDER — ACCU-CHEK SOFTCLIX LANCETS MISC
0 refills | Status: AC
  Filled 2024-04-07 – 2024-04-10 (×2): qty 100, 90d supply, fill #0

## 2024-04-07 MED ORDER — LANCET DEVICE MISC
0 refills | Status: AC
  Filled 2024-04-07: qty 1, fill #0

## 2024-04-07 MED ORDER — DEXAMETHASONE 0.5 MG/5ML PO ELIX
ORAL_SOLUTION | ORAL | 4 refills | Status: AC
  Filled 2024-04-07: qty 474, 10d supply, fill #0
  Filled 2024-04-10: qty 474, 11d supply, fill #0

## 2024-04-07 MED ORDER — FULVESTRANT 250 MG/5ML IM SOSY
500.0000 mg | PREFILLED_SYRINGE | Freq: Once | INTRAMUSCULAR | Status: AC
  Administered 2024-04-07: 500 mg via INTRAMUSCULAR
  Filled 2024-04-07: qty 10

## 2024-04-07 MED ORDER — PALBOCICLIB 125 MG PO TABS
125.0000 mg | ORAL_TABLET | Freq: Every day | ORAL | 1 refills | Status: DC
  Filled 2024-04-07: qty 21, 28d supply, fill #0
  Filled 2024-04-07: qty 21, 21d supply, fill #0
  Filled 2024-04-28: qty 21, 28d supply, fill #1

## 2024-04-07 NOTE — Progress Notes (Signed)
 [facility]        Telephone: [phone]?Fax: [phone]   Oncology Clinical Pharmacist Practitioner Initial Assessment   Catherine Lowery is a 78 y.o. female with a diagnosis of metastatic breast cancer. They were contacted today via in-person visit.  Indication/Regimen Inavolisib  (Itovebi ) + palbociclib  (Ibrance ) + fulvestrant  (Faslodex ) is being used appropriately for treatment of metastatic breast cancer by Dr. Amber Stalls.      Wt Readings from Last 1 Encounters:  04/07/24 150 lb (68 kg)    Estimated body surface area is 1.82 meters squared as calculated from the following:   Height as of this encounter: 5' 9 (1.753 m).   Weight as of this encounter: 150 lb (68 kg).  The dosing regimen is inavolisib  9 mg by mouth daily on days 1 to 28 of a 28-day cycle, palbociclib  125 mg by mouth daily on days 1 to 21 of a 28-day cycle, and fulvestrant  500 mg IM every 2 weeks x 3 doses, then every 4 weeks thereafter.   It is planned to continue until disease progression or unacceptable toxicity. Prescription dose and frequency assessed for appropriateness. The treatment goal is: Control.  Patient has agreed to treatment which is documented in physician note on 03/31/24. Counseled patient on administration, dosing, side effects, monitoring, drug-food interactions, safe handling, storage, and disposal.  Will fill through [facility]. First fulvestrant  today and then again in two weeks. There are specific fasting blood glucose monitoring that was reviewed with patient today.  Dexamethasone  mouthwash and diabetic supplies sent to [facility] and reviewed with patient.  Reviewed that Dr. Stalls switched her atorvastatin  to rosuvastatin  since thought to be less interaction with palbociclib . Dr.Iruku will closely monitor primidone  and palbociclib  interaction and has been made aware of palbociclib  concentrations possibly being reduced by primidone .  Dose Modifications Dr. Stalls is  starting inavolisib  at 6 mg by mouth daily   Access Assessment NEHEMIAH MONTEE will be receiving inavolisib  and palbociclib  through [facility] Concerns: none Start date if known: TBD  Adherence Assessment Reviewed importance on keeping a med schedule and plan for any missed doses Barriers to adherence identified? No  Communication and Learning Assessment Primary learner: Patient and sisters Barriers to learning: No barriers Preferred language: English Learning preferences: Listening   Allergies Allergies  Allergen Reactions   Morphine And Codeine Itching and Nausea And Vomiting   Percocet [Oxycodone -Acetaminophen ] Itching and Nausea And Vomiting    Vitals    04/07/2024    9:23 AM 03/28/2024    9:06 AM 03/28/2024    8:00 AM  Oncology Vitals  Height 175 cm  175 cm  Weight 68.04 kg  67.223 kg  Weight (lbs) 150 lbs  148 lbs 3 oz  BMI 22.15 kg/m2  21.89 kg/m2  Temp 98.2 F (36.8 C)  97.6 F (36.4 C)  Pulse Rate 51  48  BP 130/48 92/58 126/48  Resp 18  14  SpO2 100 %  99 %  BSA (m2) 1.82 m2  1.81 m2     Laboratory Data    Latest Ref Rng & Units 04/07/2024    8:41 AM 03/28/2024    8:38 AM 02/14/2024    8:05 AM  CBC EXTENDED  WBC 4.0 - 10.5 K/uL 3.7  3.0  3.6   RBC 3.87 - 5.11 MIL/uL 3.49  3.31  3.18   Hemoglobin 12.0 - 15.0 g/dL 8.9  8.4  8.1   HCT 63.9 - 46.0 % 26.5  25.5  24.5   Platelets 150 - 400 K/uL 135  116  120   NEUT# 1.7 - 7.7 K/uL 1.8  1.5  1.9   Lymph# 0.7 - 4.0 K/uL 1.3  1.1  1.2        Latest Ref Rng & Units 04/07/2024    8:41 AM 03/28/2024    8:38 AM 02/14/2024    8:05 AM  CMP  Glucose 70 - 99 mg/dL 882  96  885   BUN 8 - 23 mg/dL 22  20  20    Creatinine 0.44 - 1.00 mg/dL 8.81  8.82  8.73   Sodium 135 - 145 mmol/L 142  141  141   Potassium 3.5 - 5.1 mmol/L 3.9  4.0  4.0   Chloride 98 - 111 mmol/L 109  112  111   CO2 22 - 32 mmol/L 22  19  25    Calcium  8.9 - 10.3 mg/dL 9.3  9.2  9.2   Total Protein 6.5 - 8.1  g/dL 8.1  7.9  7.2   Total Bilirubin 0.0 - 1.2 mg/dL 0.3  0.3  0.3   Alkaline Phos 38 - 126 U/L 85  84  73   AST 15 - 41 U/L 35  32  26   ALT 0 - 44 U/L 29  24  20     Lab Results  Component Value Date   MG 1.6 (L) 08/26/2023   Lab Results  Component Value Date   CA2729 34.9 09/18/2023   CA2729 44.1 (H) 05/09/2023     Contraindications Contraindications were reviewed? Yes Contraindications to therapy were identified? No   Safety Precautions The following safety precautions for the use of inavolisib , palbociclib , and fulvestrant  were reviewed:  Fever: reviewed the importance of having a thermometer and the Centers for Disease Control and Prevention (CDC) definition of fever which is 100.13F (38C) or higher. Patient should call 24/7 triage at [phone] if experiencing a fever or any other symptoms Decreased white blood cells (WBCs) and increased risk for infection Decreased hemoglobin, part of the red blood cells that carry iron and oxygen Decreased platelet count and increased risk for bleeding Changes in liver function Fatigue Nausea or vomiting Hair loss (alopecia) Mouth irritation or sores Pneumonitis / ILD Handling body fluids and wastes Storage and handling Missed doses Avoid grapefruit products Hyperglycemia Diarrhea Rash Ocular changes (dry eye / blurred vision)  Inavolisib  Monitoring Parameters CBC at baseline and as clinically indicated Fasting glucose level at baseline, once every 3 days for the first week (days 1 to 7), then weekly for 3 weeks (days 8 to 28), then every 2 weeks for the next 8 weeks, then once every 4 weeks thereafter, and as clinically indicated.  HbA1C at baseline and every 3 months during treatment, and as clinically indicated. Monitor for diarrhea, hematologic toxicities, hyperglycemia, and stomatitis   Medication Reconciliation Current Outpatient Medications  Medication Sig Dispense Refill   Blood Glucose Monitoring Suppl DEVI  Check fasting glucose once every 3 days for first week, then weekly for 3 weeks, then every 2 weeks for 8 weeks, then once every 4 weeks thereafter. May substitute to any manufacturer covered by patient's insurance. 1 each 0   dexamethasone  0.5 MG/5ML elixir Swish 10 mL (1 mg) by mouth for 2 minutes, then spit. Repeat 4 times daily. Start on 1st day of inavolisib  (Itovebi ). 474 mL 4   Glucose Blood (BLOOD GLUCOSE TEST STRIPS) STRP Check fasting glucose once every 3 days for first  week, then weekly for 3 weeks, then every 2 weeks for 8 weeks, then once every 4 weeks thereafter. May substitute to any manufacturer covered by patient's insurance. 100 strip 0   Lancet Device MISC Check fasting glucose once every 3 days for first week, then weekly for 3 weeks, then every 2 weeks for 8 weeks, then once every 4 weeks thereafter. May substitute to any manufacturer covered by patient's insurance. 1 each 0   Lancets Misc. MISC Check fasting glucose once every 3 days for first week, then weekly for 3 weeks, then every 2 weeks for 8 weeks, then once every 4 weeks thereafter. May substitute to any manufacturer covered by patient's insurance. 100 each 0   ondansetron  (ZOFRAN ) 8 MG tablet Take 1 tablet (8 mg total) by mouth every 8 (eight) hours as needed for nausea or vomiting. 30 tablet 2   albuterol  (VENTOLIN  HFA) 108 (90 Base) MCG/ACT inhaler Inhale 2 puffs into the lungs every 4 (four) hours as needed for wheezing or shortness of breath.     amLODipine  (NORVASC ) 10 MG tablet Take 1 tablet (10 mg total) by mouth daily. 90 tablet 1   aspirin  (ASPIRIN  ADULT) 325 MG tablet enteric coated; may use OTC 30 tablet 0   Inavolisib  (ITOVEBI ) 3 MG TABS tablet Take 2 tablets (6 mg total) by mouth daily. 56 tablet 1   levothyroxine  (SYNTHROID ) 50 MCG tablet Take 1 tablet (50 mcg total) by mouth daily. 90 tablet 3   lidocaine -prilocaine  (EMLA ) cream Apply to affected area once 30 g 3   losartan  (COZAAR ) 25 MG tablet Take 25 mg by  mouth daily.     Multiple Vitamin (MULTIVITAMIN WITH MINERALS) TABS tablet Take 1 tablet by mouth daily.     palbociclib  (IBRANCE ) 125 MG capsule Take 1 capsule (125 mg total) by mouth daily with breakfast. Take whole with food. Take for 21 days on, 7 days off, repeat every 28 days. 21 capsule 1   POTASSIUM PO Take 1 tablet by mouth daily.     primidone  (MYSOLINE ) 50 MG tablet Take 2 tablets (100 mg total) by mouth every morning. 180 tablet 2   prochlorperazine  (COMPAZINE ) 10 MG tablet Take 1 tablet (10 mg total) by mouth every 6 (six) hours as needed for nausea or vomiting. 30 tablet 1   propranolol  (INDERAL ) 40 MG tablet Take 0.5 tablets (20 mg total) by mouth 2 (two) times daily. 90 tablet 1   rosuvastatin  (CRESTOR ) 20 MG tablet Take 1 tablet (20 mg total) by mouth daily. 90 tablet 1   VOLTAREN 1 % GEL APPLY TOPICALLY 4 TIMES DAILY AS NEEDED 100 g 0   No current facility-administered medications for this visit.   Facility-Administered Medications Ordered in Other Visits  Medication Dose Route Frequency Provider Last Rate Last Admin   fulvestrant  (FASLODEX ) injection 500 mg  500 mg Intramuscular Once Iruku, Praveena, MD        Medication reconciliation is based on the patient's most recent medication list in the electronic medical record (EMR) including herbal products and OTC medications.   The patient's medication list was reviewed today with the patient? Yes   Drug-drug interactions (DDIs) DDIs were evaluated? Yes Significant DDIs identified? Reviewed possible primidone  and palbociclib  interaction. Dr. Loretha replaced atorvastatin  with rosuvastatin  as thought to be less interaction with rosuvastatin .  Drug-Food Interactions Drug-food interactions were evaluated? Yes Drug-food interactions identified? Avoid grapefruit products  Follow-up Plan  Patient education handout given to patient Start inavolisib  6 mg by mouth  daily Start palbociclib  125 mg by mouth on days 1 to 21, followed  by a 7-day rest peroid Start fulvestrant  500 mg IM every 2 weeks for 3 doses, followed by every 28 days thereafter. Will start today Dexamethasone  mouthwash prescription sent to local pharmacy of choice Monitor for side effects and hold for Grade 2 toxicities or fasting blood glucose > 160 mg/dL.  Discussed stomatitis prevention and checking blood sugars at home once every 3 days for the first week (days 1 to 7), then weekly for 3 weeks (days 8 to 28), then every 2 weeks for the next 8 weeks, then once every 4 weeks thereafter. Will check fasting blood glucose in clinic as well. Distress thermometer not done as has been on treatment prior at [facility]. Will send visit note from today to Dr. Loretha. Ms. Sebastian and sisters would like discussion regarding recent PET scan Ondansetron  prescription PRN sent to [facility] Ms. Lawson will start cetirizine  10 mg by mouth daily for cutaneous reaction prevention Can use loperamide PRN for loose stool She will have fasting port flush labs, pharmacy clinic visit and fulvestrant  #2 in two weeks She will have fasting port flush labs, Dr. Loretha visit, and fulvestrant  # 3 on 05/07/24. She continues to follow with Luke Miyamoto at [facility] for her other conditions and they are managing her primidone  which patient takes for tremors. Dickey JINNY Sebastian can follow up with clinical pharmacy as deemed necessary by Dr. Amber Iruku going forward   Dickey JINNY Sebastian participated in the discussion, expressed understanding, and voiced agreement with the above plan. All questions were answered to their satisfaction. The patient was advised to contact the clinic at [phone] with any questions or concerns prior to their return visit.   I spent 60 minutes assessing the patient.  Lilianah Buffin A. Lucila, PharmD, BCOP, CPP  Norleen DELENA Lucila, RPH-CPP, 04/07/2024 10:20 AM  **Disclaimer: This note was dictated with voice recognition software. Similar sounding words  can inadvertently be transcribed and this note may contain transcription errors which may not have been corrected upon publication of note.**

## 2024-04-07 NOTE — Progress Notes (Signed)
 Patient counseled on palbociclib  and inavolisib  in clinic in-person visit note on 04/07/24

## 2024-04-07 NOTE — Progress Notes (Signed)
 Specialty Pharmacy Initial Fill Coordination Note  Catherine Lowery is a 78 y.o. female contacted today regarding refills of specialty medication(s) Inavolisib  (ITOVEBI ); [name]  (IBRANCE ) .  Patient requested Delivery  on 04/09/24  to verified address [address]  [name] [facility] [address]   Medication will be filled on 0.04/08/2024   Patient is aware of 0.00 copayment.

## 2024-04-07 NOTE — Progress Notes (Signed)
 Specialty Pharmacy Initial Fill Coordination Note  Catherine Lowery is a 78 y.o. female contacted today regarding refills of specialty medication(s) Inavolisib  (ITOVEBI ) .  Patient requested Delivery  on 04/09/24  to verified address [address]  [name] [id number]   Medication will be filled on 04/08/2024.   Patient is aware of 0.00 copayment.

## 2024-04-08 ENCOUNTER — Other Ambulatory Visit (HOSPITAL_COMMUNITY): Payer: Self-pay

## 2024-04-08 ENCOUNTER — Other Ambulatory Visit: Payer: Self-pay

## 2024-04-10 ENCOUNTER — Other Ambulatory Visit (HOSPITAL_COMMUNITY): Payer: Self-pay

## 2024-04-10 ENCOUNTER — Other Ambulatory Visit: Payer: Self-pay

## 2024-04-11 ENCOUNTER — Inpatient Hospital Stay

## 2024-04-16 ENCOUNTER — Ambulatory Visit: Admitting: Acute Care

## 2024-04-16 ENCOUNTER — Encounter: Payer: Self-pay | Admitting: Acute Care

## 2024-04-16 VITALS — BP 144/58 | HR 51 | Ht 69.5 in | Wt 150.6 lb

## 2024-04-16 DIAGNOSIS — R911 Solitary pulmonary nodule: Secondary | ICD-10-CM

## 2024-04-16 DIAGNOSIS — C7800 Secondary malignant neoplasm of unspecified lung: Secondary | ICD-10-CM

## 2024-04-16 DIAGNOSIS — R942 Abnormal results of pulmonary function studies: Secondary | ICD-10-CM

## 2024-04-16 DIAGNOSIS — C50919 Malignant neoplasm of unspecified site of unspecified female breast: Secondary | ICD-10-CM

## 2024-04-16 DIAGNOSIS — R918 Other nonspecific abnormal finding of lung field: Secondary | ICD-10-CM

## 2024-04-16 DIAGNOSIS — C7951 Secondary malignant neoplasm of bone: Secondary | ICD-10-CM | POA: Diagnosis not present

## 2024-04-16 DIAGNOSIS — C562 Malignant neoplasm of left ovary: Secondary | ICD-10-CM

## 2024-04-16 DIAGNOSIS — R9389 Abnormal findings on diagnostic imaging of other specified body structures: Secondary | ICD-10-CM

## 2024-04-16 DIAGNOSIS — Z87891 Personal history of nicotine dependence: Secondary | ICD-10-CM

## 2024-04-16 DIAGNOSIS — J449 Chronic obstructive pulmonary disease, unspecified: Secondary | ICD-10-CM

## 2024-04-16 DIAGNOSIS — Z720 Tobacco use: Secondary | ICD-10-CM

## 2024-04-16 DIAGNOSIS — Z853 Personal history of malignant neoplasm of breast: Secondary | ICD-10-CM

## 2024-04-16 NOTE — Patient Instructions (Addendum)
 It is good to see you today.  I am sorry you found out the results of your PET scan by mail. I have messaged Dr. Loretha to see if she feels she needs a biopsy of the lung nodule.  If she does, we will get this arranged.  I am glad your breathing is stable. I am proud of you for quitting smoking. Follow up in 3 months to ensure you are doing well.  Continue Trelegy 1 puff once daily . Rinse mouth after use Use albuterol  as needed for breakthrough shortness of breath. Call if you need us  sooner. Have a good Holiday. Metta Sours with treatment. Please contact office for sooner follow up if symptoms do not improve or worsen or seek emergency care   Let me know if you change your mind about doing the financial paperwork for Trelegy.

## 2024-04-16 NOTE — Progress Notes (Signed)
 History of Present Illness Catherine Lowery is a 78 y.o. female former smoker with history of right breast cancer, hypertension.  She was referred October 2023 for abnormal chest imaging.  She was initially followed by Dr. Brenna.  She will be transition to Dr. Shelah and Lauraine Lites nurse practitioner moving forward.  Synopsis 78 year old female former smoker with a history of right breast cancer, hypertension.  Lung cancer screening in January 2024 showed right upper lobe peripheral clustered nodules.  Short-term follow-up scan was done.  Per Dr. Harriet last note, CT imaging was stable he felt she did not need any additional CT follow-up after the May 16, 2023 visit . 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  She was treated after having a positive skin test on her forearm.  Patient was hospitalized 05/08/2023 through 05/10/2023.  During that hospitalization she was found to have a pelvic mass.  She underwent biopsy and had a new diagnosis of metastatic ovarian cancer.  She has been under treatment with Dr.Iruku since diagnosis.  Chest imaging has been maintained by medical oncology.  Most recent PET scan showed progression of disease despite treatment.  She is followed by Dr. Loretha  , medical oncology.  Treatment regimen has been changed based on most recent pet imaging results.  She is here today with her sisters for follow-up regarding her COPD.  04/16/2024 Discussed the use of AI scribe software for clinical note transcription with the patient, who gave verbal consent to proceed.  History of Present Illness Catherine Lowery is a 78 year old female with stage IV breast cancer and ovarian cancer who presents for evaluation of lung nodules and potential bone metastasis.  Since our last visit with the patient her breast cancer progressed to stage IV, and she was diagnosed with ovarian cancer. She underwent  surgery for ovarian cancer on 08/21/2023 .Recent PET scan done 03/21/2024 results showed new findings, including a nodule on her lung and possible bone involvement, according to her primary care doctor.  This shows progression of disease despite current treatment.  She is currently receiving daily injections and taking Imuran and another unspecified medication. Her treatment regimen was recently changed by her oncologist due to progression of disease on current regimen.    There was confusion regarding the communication of her PET scan results, which indicated new hypermetabolic foci in the thoracic spine and iliac bone, and an increase in size of a nodular density in the right lung.  However patient has full understanding of what the PET scan results show.  And has follow-up with oncology.  The new regiment for treatment of progressive malignancy has been initiated.  I did reach out to Dr.Iruku to see if they wanted biopsy of the new pulmonary nodule.  She responded and stated that at present time we do not need a biopsy.  I have let her know if at any point in time we need to step in and help with tissue diagnosis were happy to do so.  Patient  has a history of smoking, however she quit cold turkey May 2024.  Her COPD has improved since quitting smoking. She is currently using Trelegy for her COPD management.  She states she is compliant with this medication.  She uses albuterol  as needed for breakthrough shortness of breath or wheezing.  She is not currently on any maintenance inhaler.  She has  no current signs or symptoms of a flare.  Oxygen saturations are 95% on room air.  Patient states her Trelegy is very expensive.  We will have her complete paperwork for financial assistance to see if she qualifies.  Blood pressure was slightly elevated at 144/58.  Patient states that she is compliant with her blood pressure medications.  I have asked her to follow-up with her primary care physician regarding her  blood pressure.  Patient states she feels that her blood pressure is elevated today because she knew she was going to talking about her progressive cancer.  This is very understanding.  We will follow-up with the patient in 3 months to ensure she is continuing to do well and that her COPD is under good control.  She understands the red flags for a flare, and understands that if she develops any signs or symptoms of a flare she is to call us  right away to be evaluated for treatment.  Patient does endorse some weight loss, however no hemoptysis.     Test Results: PET scan 03/21/2024 Interval progression of disease with new hypermetabolic retroperitoneal lymph nodes and new hypermetabolic omental/mesenteric nodules. 2. New hypermetabolic foci in the midthoracic spine and posterior left iliac bone without discernible lesion on CT imaging. Imaging findings concerning for new bony metastatic involvement. 3. Interval increase in size of 2 ill-defined nodular densities in the right lung with persistent hypermetabolism. Metastatic disease a concern. 4. Stable hypermetabolic focus in the left thoracic inlet, potentially a lymph node or thyroid  nodule. Attention on follow-up recommended. 5. Aortic Atherosclerosis (ICD10-I70.0) and Emphysema (ICD10-J43.9).  PET scan 12/06/2023 Numerous patchy irregular nodular densities in both lungs demonstrating FDG uptake. These are new since the prior chest CT from 10/04/2023 and likely represent an inflammatory or infectious process. Recommend correlation with clinical findings and follow-up noncontrast chest CT in 3 months to reassess. 2. No enlarged or hypermetabolic mediastinal or hilar lymph nodes. 3. Small focus of hypermetabolism adjacent to the right hepatic lobe posteriorly suspicious for peritoneal implant. There is also a 9 mm hypermetabolic retroperitoneal nodule. 4. No findings for osseous metastatic disease. 5. Hypermetabolic focus in the left  supraclavicular region is most likely a thyroid  lesion. Recommend follow-up thyroid  ultrasound. 6. Advanced vascular disease.   05/15/2022 CT Chest without Contrast Emphysematous scarring diffuse bronchiectatic changes consistent with chronic bronchitis. Consolidation or scarring stable in the right base and. Nodularity right upper lobe laterally which has diminished when compared to the prior study. The largest nodule is now about 4 mm. No new nodules or suspicious lesions identified.   IMPRESSION: 1. Right upper lobe nodularity has diminished compared to the previous examination. 2. Numerous foci of pleural/parenchymal changes consistent with scarring which are stable findings bilaterally. 3. Emphysematous scarring. 4. 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 Typically 3 years CT follow up recommended. 5. Bilateral renal parenchymal lesions that were partially imaged and likely to represent cysts.    Latest Ref Rng & Units 04/07/2024    8:41 AM 03/28/2024    8:38 AM 02/14/2024    8:05 AM  CBC  WBC 4.0 - 10.5 K/uL 3.7  3.0  3.6   Hemoglobin 12.0 - 15.0 g/dL 8.9  8.4  8.1   Hematocrit 36.0 - 46.0 % 26.5  25.5  24.5   Platelets 150 - 400 K/uL 135  116  120        Latest Ref  Rng & Units 04/07/2024    8:41 AM 03/28/2024    8:38 AM 02/14/2024    8:05 AM  BMP  Glucose 70 - 99 mg/dL 882  96  885   BUN 8 - 23 mg/dL 22  20  20    Creatinine 0.44 - 1.00 mg/dL 8.81  8.82  8.73   Sodium 135 - 145 mmol/L 142  141  141   Potassium 3.5 - 5.1 mmol/L 3.9  4.0  4.0   Chloride 98 - 111 mmol/L 109  112  111   CO2 22 - 32 mmol/L 22  19  25    Calcium  8.9 - 10.3 mg/dL 9.3  9.2  9.2     BNP    Component Value Date/Time   BNP 21 09/21/2017 1516    ProBNP No results found for: PROBNP  PFT No results found for: FEV1PRE, FEV1POST, FVCPRE, FVCPOST, TLC, DLCOUNC, PREFEV1FVCRT,  PSTFEV1FVCRT  NM PET Image Restage (PS) Skull Base to Thigh (F-18 FDG) Result Date: 03/26/2024 CLINICAL DATA:  Subsequent treatment strategy for metastatic breast cancer. EXAM: NUCLEAR MEDICINE PET SKULL BASE TO THIGH TECHNIQUE: 7.3 mCi F-18 FDG was injected intravenously. Full-ring PET imaging was performed from the skull base to thigh after the radiotracer. CT data was obtained and used for attenuation correction and anatomic localization. Fasting blood glucose: 90 mg/dl COMPARISON:  91/92/7974 FINDINGS: Mediastinal blood pool activity: SUV max 2.1 Liver activity: SUV max NA NECK: No hypermetabolic lymph nodes in the neck. Incidental CT findings: None. CHEST: Hypermetabolic focus in the left thoracic inlet may be a lymph node or thyroid  nodule. This remains hypermetabolic with SUV max = 6.0 today compared to 6.6 previously. Advanced changes of emphysema are associated with areas of architectural distortion and scarring bilaterally. Ill-defined nodular density in the parahilar right lung (image 30/series 7) measures 2.2 x 1.1 cm, increased from 1.3 x 0.9 cm previously. SUV max = 7.1 today compared to 6.2 previously. Ill-defined consolidative nodular opacity in the peripheral right lung on image 28/7 is also progressive in the interval with low level hypermetabolism. Incidental CT findings: Right Port-A-Cath tip is positioned at the upper right atrium. Moderate atherosclerotic calcification is noted in the wall of the thoracic aorta. Coronary artery calcification is evident. Tiny right pleural effusion is similar to prior. ABDOMEN/PELVIS: No abnormal hypermetabolic activity within the liver, pancreas, adrenal glands, or spleen. Soft tissue nodule along the posterior right liver capsule identified previously is progressive in the interval measuring 2.2 x 1.5 cm today on image 104/4 with SUV max = 9.2 (4.5 previously). Previously reported nodular focus of hypermetabolism in the region of the splenic flexure is  progressive with SUV max = 7.8 today compared to 4.4 previously. New hypermetabolic retroperitoneal lymph nodes are seen in the abdomen on image 110/4 with SUV max = 6.9. 12 mm right omental nodule on 118/4 is new since prior with SUV max = 6.7. 11 mm mesenteric nodule anterior to the left psoas muscle on 135/4 is hypermetabolic with SUV max = 9.3, new since prior. There is also a new mesenteric nodule with hypermetabolism in the posterior pelvis on 148/4. Incidental CT findings: There is moderate atherosclerotic calcification of the abdominal aorta without aneurysm. Bilateral renal cysts evident. 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 SKELETON: There is a new hypermetabolic focus identified in a midthoracic vertebral body, potentially T6, with SUV max = 7.5. new hypermetabolic focus identified posterior left iliac bone with SUV max = 4.1. There is no discernible lesion at  either of these locations on CT imaging. Incidental CT findings: Degenerative changes noted in the thoracolumbar spine. IMPRESSION: 1. Interval progression of disease with new hypermetabolic retroperitoneal lymph nodes and new hypermetabolic omental/mesenteric nodules. 2. New hypermetabolic foci in the midthoracic spine and posterior left iliac bone without discernible lesion on CT imaging. Imaging findings concerning for new bony metastatic involvement. 3. Interval increase in size of 2 ill-defined nodular densities in the right lung with persistent hypermetabolism. Metastatic disease a concern. 4. Stable hypermetabolic focus in the left thoracic inlet, potentially a lymph node or thyroid  nodule. Attention on follow-up recommended. 5. Aortic Atherosclerosis (ICD10-I70.0) and Emphysema (ICD10-J43.9). Electronically Signed   By: Camellia Candle M.D.   On: 03/26/2024 08:49     Past medical hx Past Medical History:  Diagnosis Date   Cancer (HCC) 03/2018   right breast cancer    ovarian   Chronic kidney disease    CKD   COPD (chronic  obstructive pulmonary disease) (HCC)    DJD (degenerative joint disease)    LEFT HIP   Family history of breast cancer    Family history of ovarian cancer    Family history of prostate cancer    Family history of prostate cancer    H/O migraine 07/01/2019   Hyperlipidemia    takes Simvasatin daily   Hypertension    takes Propranolol  and HCTZ daioly   Hyperthyroidism    Pneumonia    x2   PONV (postoperative nausea and vomiting)    when ether used   Reactive airway disease 05/08/2023   Tremors of nervous system    takes Primidone  daily     Social History[1]  Ms.Cobarrubias reports that she has quit smoking. Her smoking use included cigarettes. She has a 21 pack-year smoking history. She has never been exposed to tobacco smoke. She has never used smokeless tobacco. She reports that she does not drink alcohol and does not use drugs.  Tobacco Cessation: Counseling given: Not Answered Tobacco comments: 02/13/22 Pt stated I'm trying to quit Archie JONELLE Che, CMA     Quit February 2025 Former smoker quit February 2025 cold turkey  Past surgical hx, Family hx, Social hx all reviewed.  Current Outpatient Medications on File Prior to Visit  Medication Sig   Accu-Chek Softclix Lancets lancets Check fasting glucose once every 3 days for first week, then weekly for 3 weeks, then every 2 weeks for 8 weeks, then once every 4 weeks thereafter.   albuterol  (VENTOLIN  HFA) 108 (90 Base) MCG/ACT inhaler Inhale 2 puffs into the lungs every 4 (four) hours as needed for wheezing or shortness of breath.   amLODipine  (NORVASC ) 10 MG tablet Take 1 tablet (10 mg total) by mouth daily.   aspirin  (ASPIRIN  ADULT) 325 MG tablet enteric coated; may use OTC   Blood Glucose Monitoring Suppl (ACCU-CHEK AVIVA PLUS) w/Device KIT Use as directed.   dexamethasone  0.5 MG/5ML elixir Swish 10 mL (1 mg) by mouth for 2 minutes, then spit. Repeat 4 times daily. Start on 1st day of inavolisib  (Itovebi ).   Glucose  Blood (BLOOD GLUCOSE TEST STRIPS) STRP Check fasting glucose once every 3 days for first week, then weekly for 3 weeks, then every 2 weeks for 8 weeks, then once every 4 weeks thereafter.   Inavolisib  (ITOVEBI ) 3 MG TABS tablet Take 2 tablets (6 mg total) by mouth daily.   Lancet Device MISC Check fasting glucose once every 3 days for first week, then weekly for 3 weeks, then every 2  weeks for 8 weeks, then once every 4 weeks thereafter. May substitute to any manufacturer covered by patient's insurance.   levothyroxine  (SYNTHROID ) 50 MCG tablet Take 1 tablet (50 mcg total) by mouth daily.   lidocaine -prilocaine  (EMLA ) cream Apply to affected area once   losartan  (COZAAR ) 25 MG tablet Take 25 mg by mouth daily.   Multiple Vitamin (MULTIVITAMIN WITH MINERALS) TABS tablet Take 1 tablet by mouth daily.   ondansetron  (ZOFRAN ) 8 MG tablet Take 1 tablet (8 mg total) by mouth every 8 (eight) hours as needed for nausea or vomiting.   palbociclib  (IBRANCE ) 125 MG tablet Take 1 tablet (125 mg total) by mouth daily. Take for 21 days on, 7 days off, repeat every 28 days.   POTASSIUM PO Take 1 tablet by mouth daily.   primidone  (MYSOLINE ) 50 MG tablet Take 2 tablets (100 mg total) by mouth every morning.   prochlorperazine  (COMPAZINE ) 10 MG tablet Take 1 tablet (10 mg total) by mouth every 6 (six) hours as needed for nausea or vomiting.   propranolol  (INDERAL ) 40 MG tablet Take 0.5 tablets (20 mg total) by mouth 2 (two) times daily.   rosuvastatin  (CRESTOR ) 20 MG tablet Take 1 tablet (20 mg total) by mouth daily.   VOLTAREN 1 % GEL APPLY TOPICALLY 4 TIMES DAILY AS NEEDED   No current facility-administered medications on file prior to visit.     Allergies[2]  Review Of Systems:  Constitutional:   +  weight loss, No night sweats,  Fevers, chills, + fatigue, or  lassitude.  HEENT:   No headaches,  Difficulty swallowing,  Tooth/dental problems, or  Sore throat,                No sneezing, itching, ear ache,  nasal congestion, post nasal drip,   CV:  No chest pain,  Orthopnea, PND, swelling in lower extremities, anasarca, dizziness, palpitations, syncope.   GI  No heartburn, indigestion, abdominal pain, nausea, vomiting, diarrhea, change in bowel habits, loss of appetite, bloody stools.   Resp: + shortness of breath with exertion less at rest.  Minimal excess mucus, no productive cough,  No non-productive cough,  No coughing up of blood.  No change in color of mucus.  No wheezing.  No chest wall deformity  Skin: no rash or lesions.  GU: no dysuria, change in color of urine, no urgency or frequency.  No flank pain, no hematuria   MS:  No joint pain or swelling.  No decreased range of motion.  No back pain.  Psych:  No change in mood or affect. No depression or anxiety.  No memory loss.   Vital Signs BP (!) 144/58   Pulse (!) 51   Ht 5' 9.5 (1.765 m)   Wt 150 lb 9.6 oz (68.3 kg)   LMP 01/24/2000   SpO2 95%   BMI 21.92 kg/m    Physical Exam:  General- No distress,  A&Ox3, pleasant and appropriate ENT: No sinus tenderness, TM clear, pale nasal mucosa, no oral exudate,no post nasal drip, no LAN Cardiac: S1, S2, regular rate and rhythm, no murmur Chest: No wheeze/ rales/ dullness; no accessory muscle use, no nasal flaring, no sternal retractions, slightly diminished to bases Abd.: Soft Non-tender, nondistended, bowel sounds positive,Body mass index is 21.92 kg/m.  Ext: No clubbing cyanosis, edema, no obvious lower extremity edema or deformities Neuro:  normal strength, moving all extremities x 4, alert and oriented x 3, appropriate Skin: No rashes, warm and dry, no obvious skin  lesions Psych: normal mood and behavior, appropriately anxious regarding her diagnosis  Physical Exam GENERAL: No distress, alert and oriented times 3. EARS NOSE THROAT: No sinus tenderness, tympanic membranes clear, pale nasal mucosa, no oral exudate, no post nasal drip, no lymphadenopathy. CHEST: Lungs  clear to auscultation bilaterally. No wheeze, rales, dullness, no accessory muscle use, no nasal flaring, no sternal retractions. CARDIAC: S1, S2, regular rate and rhythm, no murmur. ABDOMINAL: Soft, non tender. ND, BS present. EXTREMITIES: No clubbing, cyanosis, edema. No obvious deformities. NEUROLOGICAL: Normal strength. Alert and oriented x 3, MAE x 4. SKIN: No rashes, warm and dry. No obvious skin lesions. PSYCHIATRIC: Normal mood and behavior.   Assessment/Plan Assessment & Plan Metastatic breast cancer with lung and bone metastases Progression with new lymph nodes and hypermetabolic foci in thoracic spine and iliac bone, indicating potential new bony metastasis.  Increased size of right lung nodular density suggests metastatic disease.  Treatment regimen adjusted based on PET scan results.  - Await oncologist's decision on lung nodule biopsy. - Continue adjusted treatment regimen per medical oncology. - Ensure communication with oncologist regarding treatment plan and PET scan results.  Chronic obstructive pulmonary disease (COPD) Well-managed post smoking cessation. No exacerbations or issues. Stable interval - Continue current COPD management plan. - Continue Trelegy 1 puff once daily . - Rinse mouth after use - Use albuterol  as needed for breakthrough shortness of breath. - Call if you need us  sooner.  AVS 04/16/2024 I am sorry you found out the results of your PET scan by mail. I have messaged Dr. Loretha to see if she feels she needs a biopsy of the lung nodule.  If she does, we will get this arranged.  I am glad your breathing is stable. I am proud of you for quitting smoking. Follow up in 3 months to ensure you are doing well.  Continue Trelegy 1 puff once daily . Rinse mouth after use Use albuterol  as needed for breakthrough shortness of breath. Call if you need us  sooner. Have a good Holiday. Metta Sours with treatment. Please contact office for sooner follow up if  symptoms do not improve or worsen or seek emergency care   Let me know if you change your mind about doing the financial paperwork for Trelegy.   I spent 40 minutes dedicated to the care of this patient on the date of this encounter to include pre-visit review of records, face-to-face time with the patient discussing conditions above, post visit ordering of testing, clinical documentation with the electronic health record, making appropriate referrals as documented, and communicating necessary information to the patient's healthcare team.      Lauraine JULIANNA Lites, NP 04/16/2024  8:29 PM                    [1]  Social History Tobacco Use   Smoking status: Former    Current packs/day: 0.75    Average packs/day: 0.8 packs/day for 28.0 years (21.0 ttl pk-yrs)    Types: Cigarettes    Passive exposure: Never   Smokeless tobacco: Never   Tobacco comments:    02/13/22 Pt stated I'm trying to quit Archie JONELLE Che, CMA     Quit February 2025  Vaping Use   Vaping status: Never Used  Substance Use Topics   Alcohol use: No    Alcohol/week: 0.0 standard drinks of alcohol   Drug use: No  [2]  Allergies Allergen Reactions   Morphine And Codeine Itching and Nausea And Vomiting  Percocet [Oxycodone -Acetaminophen ] Itching and Nausea And Vomiting

## 2024-04-17 ENCOUNTER — Telehealth: Payer: Self-pay | Admitting: *Deleted

## 2024-04-17 NOTE — Telephone Encounter (Signed)
 Spoke with the patient and moved appt from 2/16 to 3/2

## 2024-04-21 ENCOUNTER — Inpatient Hospital Stay

## 2024-04-21 ENCOUNTER — Encounter: Payer: Self-pay | Admitting: Adult Health

## 2024-04-21 ENCOUNTER — Other Ambulatory Visit (HOSPITAL_COMMUNITY): Payer: Self-pay

## 2024-04-21 ENCOUNTER — Inpatient Hospital Stay: Admitting: Adult Health

## 2024-04-21 VITALS — BP 165/62 | HR 61 | Temp 98.2°F | Resp 18 | Ht 69.5 in | Wt 144.5 lb

## 2024-04-21 DIAGNOSIS — R634 Abnormal weight loss: Secondary | ICD-10-CM | POA: Diagnosis not present

## 2024-04-21 DIAGNOSIS — Z170 Estrogen receptor positive status [ER+]: Secondary | ICD-10-CM

## 2024-04-21 DIAGNOSIS — Z5111 Encounter for antineoplastic chemotherapy: Secondary | ICD-10-CM | POA: Diagnosis not present

## 2024-04-21 DIAGNOSIS — H579 Unspecified disorder of eye and adnexa: Secondary | ICD-10-CM

## 2024-04-21 DIAGNOSIS — Z79811 Long term (current) use of aromatase inhibitors: Secondary | ICD-10-CM

## 2024-04-21 DIAGNOSIS — D61818 Other pancytopenia: Secondary | ICD-10-CM

## 2024-04-21 DIAGNOSIS — C569 Malignant neoplasm of unspecified ovary: Secondary | ICD-10-CM

## 2024-04-21 DIAGNOSIS — C562 Malignant neoplasm of left ovary: Secondary | ICD-10-CM

## 2024-04-21 DIAGNOSIS — C50311 Malignant neoplasm of lower-inner quadrant of right female breast: Secondary | ICD-10-CM

## 2024-04-21 DIAGNOSIS — T50905A Adverse effect of unspecified drugs, medicaments and biological substances, initial encounter: Secondary | ICD-10-CM

## 2024-04-21 LAB — CMP (CANCER CENTER ONLY)
ALT: 15 U/L (ref 0–44)
AST: 24 U/L (ref 15–41)
Albumin: 4.2 g/dL (ref 3.5–5.0)
Alkaline Phosphatase: 78 U/L (ref 38–126)
Anion gap: 12 (ref 5–15)
BUN: 26 mg/dL — ABNORMAL HIGH (ref 8–23)
CO2: 17 mmol/L — ABNORMAL LOW (ref 22–32)
Calcium: 8.7 mg/dL — ABNORMAL LOW (ref 8.9–10.3)
Chloride: 108 mmol/L (ref 98–111)
Creatinine: 1.34 mg/dL — ABNORMAL HIGH (ref 0.44–1.00)
GFR, Estimated: 40 mL/min — ABNORMAL LOW
Glucose, Bld: 227 mg/dL — ABNORMAL HIGH (ref 70–99)
Potassium: 4.1 mmol/L (ref 3.5–5.1)
Sodium: 137 mmol/L (ref 135–145)
Total Bilirubin: 0.3 mg/dL (ref 0.0–1.2)
Total Protein: 8.6 g/dL — ABNORMAL HIGH (ref 6.5–8.1)

## 2024-04-21 LAB — CBC WITH DIFFERENTIAL/PLATELET
Abs Immature Granulocytes: 0 K/uL (ref 0.00–0.07)
Basophils Absolute: 0.1 K/uL (ref 0.0–0.1)
Basophils Relative: 1 %
Eosinophils Absolute: 0.1 K/uL (ref 0.0–0.5)
Eosinophils Relative: 3 %
HCT: 27.5 % — ABNORMAL LOW (ref 36.0–46.0)
Hemoglobin: 9.3 g/dL — ABNORMAL LOW (ref 12.0–15.0)
Immature Granulocytes: 0 %
Lymphocytes Relative: 38 %
Lymphs Abs: 1.4 K/uL (ref 0.7–4.0)
MCH: 25.4 pg — ABNORMAL LOW (ref 26.0–34.0)
MCHC: 33.8 g/dL (ref 30.0–36.0)
MCV: 75.1 fL — ABNORMAL LOW (ref 80.0–100.0)
Monocytes Absolute: 0.2 K/uL (ref 0.1–1.0)
Monocytes Relative: 7 %
Neutro Abs: 1.9 K/uL (ref 1.7–7.7)
Neutrophils Relative %: 51 %
Platelets: 101 K/uL — ABNORMAL LOW (ref 150–400)
RBC: 3.66 MIL/uL — ABNORMAL LOW (ref 3.87–5.11)
RDW: 13.7 % (ref 11.5–15.5)
Smear Review: NORMAL
WBC: 3.7 K/uL — ABNORMAL LOW (ref 4.0–10.5)
nRBC: 0 % (ref 0.0–0.2)

## 2024-04-21 LAB — TSH: TSH: 1.49 u[IU]/mL (ref 0.350–4.500)

## 2024-04-21 LAB — HEMOGLOBIN A1C
Hgb A1c MFr Bld: 5.7 % — ABNORMAL HIGH (ref 4.8–5.6)
Mean Plasma Glucose: 116.89 mg/dL

## 2024-04-21 MED ORDER — FULVESTRANT 250 MG/5ML IM SOSY
500.0000 mg | PREFILLED_SYRINGE | Freq: Once | INTRAMUSCULAR | Status: AC
  Administered 2024-04-21: 500 mg via INTRAMUSCULAR

## 2024-04-21 MED ORDER — AZELASTINE HCL 0.05 % OP SOLN
1.0000 [drp] | Freq: Two times a day (BID) | OPHTHALMIC | 12 refills | Status: AC
  Filled 2024-04-21: qty 6, 30d supply, fill #0

## 2024-04-21 NOTE — Progress Notes (Signed)
 [facility] Cancer Follow up:    Catherine Cramp, NP [address] [name] [address]   DIAGNOSIS: Cancer Staging  Left ovarian epithelial cancer [facility]) Staging form: Ovary, Fallopian Tube, and Primary Peritoneal Carcinoma, AJCC 8th Edition - Clinical stage from 05/14/2023: FIGO Stage IIIC (cT3c, cN1, cM0) - Signed by Lonn Hicks, MD on 05/14/2023 Stage prefix: Initial diagnosis  Malignant neoplasm of lower-inner quadrant of right breast of female, estrogen receptor positive (HCC) Staging form: Breast, AJCC 8th Edition - Pathologic stage from 05/07/2018: Stage IB (pT2, pN73mi, cM0, G2, ER+, PR+, HER2-) - Signed by Crawford Morna Pickle, NP on 04/07/2019 Histologic grading system: 3 grade system    SUMMARY OF ONCOLOGIC HISTORY: 78 y.o. Catherine Lowery, [name] woman status post right breast lower inner quadrant biopsy 03/18/2018 for a clinical T2N0, [name], grade 2 or 3, estrogen and progesterone receptor positive, [name], with an MIB-1 of 5%   (1) Genetic testing performed through [facility] Common Hereditary Cancers Panel + Thyroid  Cancer Panel on 05/09/2018 showing no deleterious mutations APC, [name], [name], [name], [name], [name], [name], [name], [name], [name], [name], [name], [name], [name], [name], [name], [name], [name], [name], [name], [name], KIT, MEN1, [name], [name], [name], [name], [name], [name], [name], [name], [name], [name], [name], [name], [name], POLE, [name], [name], RAD50, [name], [name], [name], [name], [name], [name], [name], [name], [name], [name], [name], [name], [name], [name], [name], VHL.              (a) Two variants of uncertain significance in the genes [name] c.1176C>T (Silent) and [name] c.1419+4C>T (Intronic) were identified.   (2) right lumpectomy and sentinel lymph node sampling 05/07/2018 showed a pT2 pN1, [name], grade 2, with a positive inferior margin             (a) reexcision scheduled for 06/05/2018    (3) MammaPrint obtained from the definitive surgical sample read as low risk predicting a disease-free survival at 5 years of 97.8% without need of chemotherapy.   (4) adjuvant radiation 07/11/2018 - 08/27/2018             (a) 1. Right breast; [name]; [name]; 6 fractions of 2 Gy for a total of 12 Gy   (5) anastrozole  started 10/02/2018-discotninued in 07/2023             (a) bone density 05/05/2021, [name], normal imits       (6) genetic testing 05/18/2018 through the Common Hereditary Cancers Panel + Thyroid  Caner panel showed no deleterious mutations in APC, [name], [name], [name], [name], [name], [name], [name], [name], [name], [name], [name], [name], [name], [name], [name], [name], [name], [name], [name], [name], KIT, MEN1, [name], [name], [name], [name], [name], [name], [name], [name], [name], [name], [name], [name], [name], POLE, [name], [name], RAD50, [name], [name], [name], [name], [name], [name], [name], [name], [name], [name], [name], [name], [name], [name], [name], VHL.             (a) 2 variants of uncertain significance in the genes [name] c.1176C>T (Silent) and [name] c.1419+4C>T (Intronic) were identified.               (  b) UPDATE: The [name] c.1419+4C>T (Intronic) VUS has been reclassified to Likely Benign. The report date is 08/08/2018.   (7) the patient has thalassemia/sickle cell trait: Hemoglobin electrophoresis 06/23/2019 shows hemoglobin 87.8%, [name], hemoglobin A2 elevated at 3.6%  OVARIAN CANCER and metastatic breast cancer:  She was admitted int he hospital 05/09/2023 with abdmonial pain, bloating, and constipation, with symptoms and imaging worrisome for ovarian cancer  Omental biopsy on 05/10/2023 consitent with stage IIIC ovarian cancer  Neoadjuvant chemotherapy with Carbo/Taxol  given every 3 weeks x 6 cycles   TAH with  BSO and radical debulking, omentectomy on 08/21/2023, lobular cancer consistent with breast primary noted in the omentum on final pathology.    Letrozole  2.5mg  daily + Verzenio  BID began in 08/2023-03/2024  Invasolib 6mg  daily + [name]  125mg  daily began 03/2024  Fulvestrant  added on 04/07/2024  CURRENT THERAPY: Invasolib, [name] , [name], who gave verbal consent to proceed.  History of Present Illness Catherine Lowery is an 78 year old woman with metastatic breast cancer who presents for oncology follow-up to evaluate disease progression and treatment-related symptoms.  Recent PET scan on 03/26/2024 showed new lymphadenopathy, omental nodules, and findings concerning for new bony and pulmonary lesions, with new activity in the spine and left pelvis not seen on CT. She and her family are concerned about these possible new metastases and about how the results were communicated, and her sisters are present today to help understand her disease status.  She is on systemic therapy with daily Ibrance  for 21 days of each cycle with a 7-day break, plus regular injections. She has intermittent diarrhea controlled with loperamide while maintaining hydration. She has dysgeusia with loss of taste that reduces appetite, so she uses protein supplements, though higher-protein formulas worsen diarrhea and regular formulas are better tolerated. She checks blood glucose every three days with recent values of 166, 129, and 176 mg/dL. She uses mouthwash for oral care as directed.  Since starting her current regimen she has new pruritus of the eyes without dryness or tearing. She notes occasional blood-tinged nasal drainage when blowing her nose, which she attributes to dryness, and uses home humidification with relief. She denies new skin changes, rashes, or recent falls.  She has hypertension,  monitors blood pressure at home, and takes medication as prescribed. Recent hemoglobin was 9.3 g/dL, improved from prior values.     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   Multiple thyroid  nodules 06/01/2021   H/O migraine 07/01/2019   Bradycardia 12/17/2018   Peripheral edema 12/17/2018   Genetic testing 05/20/2018   Family history of breast cancer    Family history of prostate cancer    Family history of ovarian cancer    Malignant neoplasm of lower-inner quadrant of right breast of female, estrogen receptor positive (HCC) 04/11/2018   DNR (do not resuscitate) 05/22/2017   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  MEDICAL HISTORY: Past Medical History:  Diagnosis Date   Cancer (HCC) 03/2018   right breast cancer    ovarian   Chronic kidney disease    CKD   COPD (chronic obstructive pulmonary disease) (HCC)    DJD (degenerative joint disease)    LEFT HIP   Family history of breast cancer    Family history of ovarian cancer    Family history of prostate  cancer    Family history of prostate cancer    H/O migraine 07/01/2019   Hyperlipidemia    takes Simvasatin daily   Hypertension    takes Propranolol  and HCTZ daioly   Hyperthyroidism    Pneumonia    x2   PONV (postoperative nausea and vomiting)    when ether used   Reactive airway disease 05/08/2023   Tremors of nervous system    takes Primidone  daily    SURGICAL HISTORY: Past Surgical History:  Procedure Laterality Date   BOne Spur     left heel   BOWEL RESECTION N/A 08/21/2023   Procedure: SMALL BOWEL RESECTION WITH SIDE-TO-SIDE, FUNCTIONAL END-TO-END ANASTOMOSIS;  Surgeon: Eldonna Mays, MD;  Location: [facility];  Service: Gynecology;  Laterality: N/A;   BREAST LUMPECTOMY WITH RADIOACTIVE SEED AND SENTINEL LYMPH NODE BIOPSY Right 05/07/2018   Procedure: RIGHT BREAST LUMPECTOMY WITH RADIOACTIVE SEED AND RIGHT  SENTINEL LYMPH NODE MAPPING;  Surgeon: Vanderbilt Ned, MD;  Location: [facility];  Service: General;  Laterality: Right;   COLONOSCOPY     IR IMAGING GUIDED PORT INSERTION  05/10/2023   IR US  GUIDE BX ASP/DRAIN  05/10/2023   JOINT REPLACEMENT Bilateral    knee   KNEE SURGERY Left    lateral muscle release in [employer]   OMENTECTOMY N/A 08/21/2023   Procedure: OMENTECTOMY;  Surgeon: Eldonna Mays, MD;  Location: [facility];  Service: Gynecology;  Laterality: N/A;  via robot approach   RE-EXCISION OF BREAST LUMPECTOMY Right 06/05/2018   Procedure: RE-EXCISION OF RIGHT BREAST LUMPECTOMY;  Surgeon: Vanderbilt Ned, MD;  Location: [facility];  Service: General;  Laterality: Right;   ROBOTIC ASSISTED TOTAL HYSTERECTOMY WITH BILATERAL SALPINGO OOPHERECTOMY N/A 08/21/2023   Procedure: ROBOTIC ASSISTED TOTAL LAPAROSCOPY HYSTERECTOMY WITH BILATERAL SALPINGO-OOPHORECTOMY, RADICAL DISSECTION FOR DEBULKING;  Surgeon: Eldonna Mays, MD;  Location: [facility];  Service: Gynecology;  Laterality: N/A;   ROTATOR CUFF REPAIR Bilateral    TOTAL HIP ARTHROPLASTY Left  12/29/2015   Procedure: TOTAL HIP ARTHROPLASTY ANTERIOR APPROACH;  Surgeon: Toribio JULIANNA Chancy, MD;  Location: [facility];  Service: Orthopedics;  Laterality: Left;    SOCIAL HISTORY: Social History   Socioeconomic History   Marital status: Single    Spouse name: Not on file   Number of children: 1   Years of education: 14   Highest education level: Not on file  Occupational History    Comment: retired  Tobacco Use   Smoking status: Former    Current packs/day: 0.75    Average packs/day: 0.8 packs/day for 28.0 years (21.0 ttl pk-yrs)    Types: Cigarettes    Passive exposure: Never   Smokeless tobacco: Never   Tobacco comments:    02/13/22 Pt stated I'm  trying to quit Catherine Lowery, CMA     Quit February 2025  Vaping Use   Vaping status: Never Used  Substance and Sexual Activity   Alcohol use: No    Alcohol/week: 0.0 standard drinks of alcohol   Drug use: No   Sexual activity: Not Currently    Birth control/protection: Post-menopausal  Other Topics Concern   Not on file  Social History Narrative   Lives with handicapped son   Caffeine- coffee, 2 cups daily, 1-2 sodas daily   Social Drivers of Health   Tobacco Use: Medium Risk (04/16/2024)   Patient History    Smoking Tobacco Use: Former    Smokeless Tobacco Use: Never    Passive Exposure: Never  Physicist, Medical Strain: Not on file  Food Insecurity: No Food Insecurity (08/21/2023)   Hunger Vital Sign    Worried About Running Out of Food in the Last Year: Never true    Ran Out of Food in the Last Year: Never true  Transportation Needs: No Transportation Needs (08/21/2023)   PRAPARE - Administrator, Civil Service (Medical): No    Lack of Transportation (Non-Medical): No  Physical Activity: Not on file  Stress: Not on file  Social Connections: Moderately Integrated (08/21/2023)   Social Connection and Isolation Panel    Frequency of Communication with Friends and Family: More than three times a week     Frequency of Social Gatherings with Friends and Family: Three times a week    Attends Religious Services: More than 4 times per year    Active Member of Clubs or Organizations: Yes    Attends Banker Meetings: Never    Marital Status: Divorced  Catering Manager Violence: Not At Risk (08/21/2023)   Humiliation, Afraid, Rape, and Kick questionnaire    Fear of Current or Ex-Partner: No    Emotionally Abused: No    Physically Abused: No    Sexually Abused: No  Depression (PHQ2-9): Low Risk (01/17/2024)   Depression (PHQ2-9)    PHQ-2 Score: 0  Alcohol Screen: Not on file  Housing: Low Risk (08/21/2023)   Housing Stability Vital Sign    Unable to Pay for Housing in the Last Year: No    Number of Times Moved in the Last Year: 0    Homeless in the Last Year: No  Utilities: Not At Risk (08/21/2023)   AHC Utilities    Threatened with loss of utilities: No  Health Literacy: Not on file    FAMILY HISTORY: Family History  Problem Relation Age of Onset   Hypertension Mother    Heart disease Mother    Stroke Mother    Multiple myeloma Father 55   Thyroid  cancer Sister        dx 40's/50's   Cancer Paternal Aunt        unknown   Prostate cancer Paternal Uncle        dx >50- cancer was the cause of his death   Ovarian cancer Paternal Aunt    Throat cancer Paternal Uncle    Breast cancer Cousin        dx >50   Cancer Cousin        type unk   Cancer Cousin        type unk   Cancer Cousin        type unk    Review of Systems  Constitutional:  Negative for appetite change, chills, fatigue, fever and unexpected weight change.  HENT:   Negative for hearing loss, lump/mass and trouble swallowing.   Eyes:  Negative for eye problems and icterus.  Respiratory:  Negative for chest tightness, cough and shortness of breath.   Cardiovascular:  Negative for chest pain, leg swelling and palpitations.  Gastrointestinal:  Negative for abdominal distention, abdominal pain,  constipation, diarrhea, nausea and vomiting.  Endocrine: Negative for hot flashes.  Genitourinary:  Negative for difficulty urinating.   Musculoskeletal:  Negative for arthralgias.  Skin:  Negative for itching and rash.  Neurological:  Negative for dizziness, extremity weakness, headaches and numbness.  Hematological:  Negative for adenopathy. Does not bruise/bleed easily.  Psychiatric/Behavioral:  Negative for depression. The patient is not nervous/anxious.       PHYSICAL EXAMINATION    Vitals:   04/21/24 1048  BP: (!) 165/62  Pulse: 61  Resp: 18  Temp: 98.2 F (36.8 C)  SpO2: 100%    Physical Exam Constitutional:      General: She is not in acute distress.    Appearance: Normal appearance. She is not toxic-appearing.  HENT:     Head: Normocephalic and atraumatic.     Mouth/Throat:     Mouth: Mucous membranes are moist.     Pharynx: Oropharynx is clear. No oropharyngeal exudate or posterior oropharyngeal erythema.  Eyes:     General: No scleral icterus. Cardiovascular:     Rate and Rhythm: Normal rate and regular rhythm.     Pulses: Normal pulses.     Heart sounds: Normal heart sounds.  Pulmonary:     Effort: Pulmonary effort is normal.     Breath sounds: Normal breath sounds.  Abdominal:     General: Abdomen is flat. Bowel sounds are normal. There is no distension.     Palpations: Abdomen is soft.     Tenderness: There is no abdominal tenderness.  Musculoskeletal:        General: No swelling.     Cervical back: Neck supple.  Lymphadenopathy:     Cervical: No cervical adenopathy.  Skin:    General: Skin is warm and dry.     Findings: No rash.  Neurological:     General: No focal deficit present.     Mental Status: She is alert.  Psychiatric:        Mood and Affect: Mood normal.        Behavior: Behavior normal.     LABORATORY DATA:  CBC    Component Value Date/Time   WBC 3.7 (L) 04/21/2024 1015   RBC 3.66 (L) 04/21/2024 1015   HGB 9.3 (L)  04/21/2024 1015   HGB 9.3 (L) 08/13/2023 1010   HCT 27.5 (L) 04/21/2024 1015   PLT 101 (L) 04/21/2024 1015   PLT 122 (L) 08/13/2023 1010   MCV 75.1 (L) 04/21/2024 1015   MCH 25.4 (L) 04/21/2024 1015   MCHC 33.8 04/21/2024 1015   RDW 13.7 04/21/2024 1015   LYMPHSABS PENDING 04/21/2024 1015   MONOABS PENDING 04/21/2024 1015   EOSABS PENDING 04/21/2024 1015   BASOSABS PENDING 04/21/2024 1015    CMP     Component Value Date/Time   NA 142 04/07/2024 0841   NA 139 11/22/2020 0000   K 3.9 04/07/2024 0841   CL 109 04/07/2024 0841   CO2 22 04/07/2024 0841   GLUCOSE 117 (H) 04/07/2024 0841   BUN 22 04/07/2024 0841   CREATININE 1.18 (H) 04/07/2024 0841   CREATININE 1.05 (H) 05/19/2019 1034   CALCIUM  9.3 04/07/2024 0841   PROT  8.1 04/07/2024 0841   ALBUMIN  4.1 04/07/2024 0841   AST 35 04/07/2024 0841   ALT 29 04/07/2024 0841   ALKPHOS 85 04/07/2024 0841   BILITOT 0.3 04/07/2024 0841   GFRNONAA 46 (L) 04/07/2024 0841   GFRNONAA 48 (L) 01/23/2018 0804   GFRAA >60 06/23/2019 1524   GFRAA 56 (L) 01/23/2018 0804     ASSESSMENT and THERAPY PLAN:   No problem-specific Assessment & Plan notes found for this encounter.   Assessment and Plan Assessment & Plan Metastatic estrogen receptor positive breast cancer with bone and lung metastases Progressive disease with new lymphadenopathy, omental nodules, possible new osseous and pulmonary metastases. Reviewed scans with patients in detail  . - Discussed the need to repeat imaging to follow concern in lung and bone.   - Scheduled next oncology follow-up in two weeks.  Chemotherapy-related adverse effects Intermittent diarrhea, dysgeusia, pruritic eyes, and mild blood-tinged nasal drainage attributed to therapy. Mild injection site discomfort. No new pain, dermatologic changes, or dehydration. Supportive care measures in place. - Ordered Astelin  phthalmic drops for pruritus at [facility]. - Recommended saline nasal spray and  humidification for epistaxis and nasal dryness. - Continued loperamide as needed for diarrhea. - Encouraged adequate oral hydration and monitored for dehydration. - Continued mouthwash as instructed for oral care.  Abnormal weight loss Abnormal weight loss and anorexia with dysgeusia affecting food selection. Nutritional supplements consumed, but high-protein formulas exacerbate diarrhea. Nutritional optimization critical during therapy. - Recommended trial of standard nutritional supplement (non-high protein) to assess gastrointestinal tolerance. - Placed referral to dietitian for nutrition counseling and dietary optimization at next visit in January. - Monitored weight and nutritional status at follow-up.  Hyperglycemia Home blood glucose monitoring shows values between 129-176 mg/dL. Ongoing monitoring necessary due to therapy effects on glucose metabolism. Awaiting blood sugar results on today's CMET. -  Awaiting results of additional laboratory testing and will adjust management as indicated after consulting with pharmacy.  Hypertension Hypertension managed with medication. Recent elevated blood pressure. - Continued antihypertensive medication as prescribed. - Monitored blood pressure at home daily or every other day. - Instructed to report persistently elevated blood pressure to the oncology team.  RTC in 2 weeks for labs and f/u.     All questions were answered. The patient knows to call the clinic with any problems, questions or concerns. We can certainly see the patient much sooner if necessary.  Total encounter time:55 minutes*in face-to-face visit time, chart review, lab review, care coordination, order entry, and documentation of the encounter time.    Morna Kendall, NP 04/21/2024 11:00 AM Medical Oncology and Hematology [facility] [address] [place], [name] [address] Tel. [phone]    Fax. [phone]  *Total Encounter Time as defined by the  Centers for Medicare and Medicaid Services includes, in addition to the face-to-face time of a patient visit (documented in the note above) non-face-to-face time: obtaining and reviewing outside history, ordering and reviewing medications, tests or procedures, care coordination (communications with other health care professionals or caregivers) and documentation in the medical record.

## 2024-04-22 ENCOUNTER — Telehealth: Payer: Self-pay

## 2024-04-22 NOTE — Telephone Encounter (Addendum)
 Guerry, can you ask her what her most recent blood sugars have been and whether they were fasting?  If they were fasting, she will need to hold the invasolib and check her fasting blood sugar daily, until her sugars return to less than 160.      The patient is aware that her recent blood sugar in clinic was 227. She reported that on 12/19 her fasting blood sugar was 178. She stated she will continue to monitor her fasting blood sugars and will hold off on taking Inavolisib  6 mg until her blood sugars are under 160, as previously discussed.

## 2024-04-22 NOTE — Telephone Encounter (Signed)
 Called the patient to relay the message from [facility] regarding her elevated glucose reading. Requested that she check and report her home glucose readings for charting purposes. Also asked for her blood pressure reading for today, as her BP in clinic yesterday was repeated twice and remained significantly elevated.

## 2024-04-23 ENCOUNTER — Other Ambulatory Visit: Payer: Self-pay

## 2024-04-23 ENCOUNTER — Encounter: Payer: Self-pay | Admitting: Hematology and Oncology

## 2024-04-28 ENCOUNTER — Inpatient Hospital Stay

## 2024-04-28 ENCOUNTER — Telehealth: Payer: Self-pay | Admitting: *Deleted

## 2024-04-28 ENCOUNTER — Other Ambulatory Visit: Payer: Self-pay

## 2024-04-28 NOTE — Telephone Encounter (Signed)
 Pt called and stated that BS have been below 160 for the last week.   12/24 153 12/24 125 12/26 94 12/27 109 12/28 113  Advised to restart invasolib daily as directed and continue to check BS daily and to call office if BS increases over 160. Pt verbalized understanding.

## 2024-04-28 NOTE — Progress Notes (Signed)
 Specialty Pharmacy Refill Coordination Note  Catherine Lowery is a 78 y.o. female contacted today regarding refills of specialty medication(s) [name]  (IBRANCE )   Patient requested Delivery   Delivery date: 05/07/24   Verified address: [address]  [name]  [id number]   Medication will be filled on: 05/06/24

## 2024-04-28 NOTE — Progress Notes (Signed)
 Specialty Pharmacy Ongoing Clinical Assessment Note  COCO SHARPNACK is a 78 y.o. female who is being followed by the specialty pharmacy service for RxSp Oncology   Patient's specialty medication(s) reviewed today: [name]  (IBRANCE ); Inavolisib  (ITOVEBI )   Missed doses in the last 4 weeks: 6 (Pt was advised to hold 6 doses of Itovebi  by MD due to hyperglycemia, no missed doses of Ibrance )   Patient/Caregiver did not have any additional questions or concerns.   Therapeutic benefit summary: Unable to assess   Adverse events/side effects summary: Experienced adverse events/side effects (diarrhea (patient is using Imodium PRN) and hyperglycemia from the Itovebi  (MD is monitoring closely))   Patient's therapy is appropriate to: Continue    Goals Addressed             This Visit's Progress    Maintain optimal adherence to therapy   On track    Patient is on track. Patient will maintain adherence         Follow up: 3 months  Silvano LOISE Dolly Specialty Pharmacist

## 2024-04-30 ENCOUNTER — Telehealth: Payer: Self-pay | Admitting: Dietician

## 2024-04-30 NOTE — Telephone Encounter (Signed)
 Patient referral for weight loss . First attempt to reach. Provided my cell# on voice mail to return call to set up a remote nutrition consult.  Micheline Craven, RDN, LDN Registered Dietitian, [facility] Part Time Remote (Usual office hours: Tuesday-Thursday) Cell: [phone]

## 2024-05-06 ENCOUNTER — Other Ambulatory Visit: Payer: Self-pay

## 2024-05-07 ENCOUNTER — Other Ambulatory Visit: Payer: Self-pay

## 2024-05-07 ENCOUNTER — Inpatient Hospital Stay: Admitting: Hematology and Oncology

## 2024-05-07 ENCOUNTER — Inpatient Hospital Stay: Admitting: Pharmacist

## 2024-05-07 ENCOUNTER — Inpatient Hospital Stay: Attending: Psychiatry

## 2024-05-07 ENCOUNTER — Inpatient Hospital Stay

## 2024-05-07 NOTE — Progress Notes (Signed)
 Specialty Pharmacy Refill Coordination Note  Catherine Lowery is a 79 y.o. female contacted today regarding refills of specialty medication(s) Inavolisib  (ITOVEBI )   Patient requested Delivery   Delivery date: 05/09/24   Verified address: [address]  [name] [address]   Medication will be filled on: 05/08/24

## 2024-05-08 ENCOUNTER — Other Ambulatory Visit: Payer: Self-pay | Admitting: Pharmacist

## 2024-05-08 ENCOUNTER — Inpatient Hospital Stay

## 2024-05-08 ENCOUNTER — Other Ambulatory Visit (HOSPITAL_COMMUNITY): Payer: Self-pay

## 2024-05-08 ENCOUNTER — Other Ambulatory Visit: Payer: Self-pay | Admitting: *Deleted

## 2024-05-08 ENCOUNTER — Telehealth: Payer: Self-pay | Admitting: Dietician

## 2024-05-08 ENCOUNTER — Inpatient Hospital Stay: Attending: Psychiatry

## 2024-05-08 ENCOUNTER — Other Ambulatory Visit: Payer: Self-pay

## 2024-05-08 ENCOUNTER — Inpatient Hospital Stay: Attending: Psychiatry | Admitting: Hematology and Oncology

## 2024-05-08 VITALS — BP 110/58 | HR 53 | Temp 98.4°F | Resp 17 | Wt 144.1 lb

## 2024-05-08 DIAGNOSIS — I129 Hypertensive chronic kidney disease with stage 1 through stage 4 chronic kidney disease, or unspecified chronic kidney disease: Secondary | ICD-10-CM | POA: Insufficient documentation

## 2024-05-08 DIAGNOSIS — Z85850 Personal history of malignant neoplasm of thyroid: Secondary | ICD-10-CM | POA: Diagnosis not present

## 2024-05-08 DIAGNOSIS — Z170 Estrogen receptor positive status [ER+]: Secondary | ICD-10-CM

## 2024-05-08 DIAGNOSIS — N1831 Chronic kidney disease, stage 3a: Secondary | ICD-10-CM | POA: Insufficient documentation

## 2024-05-08 DIAGNOSIS — Z17411 Hormone receptor positive with human epidermal growth factor receptor 2 negative status: Secondary | ICD-10-CM | POA: Diagnosis not present

## 2024-05-08 DIAGNOSIS — C562 Malignant neoplasm of left ovary: Secondary | ICD-10-CM

## 2024-05-08 DIAGNOSIS — C50311 Malignant neoplasm of lower-inner quadrant of right female breast: Secondary | ICD-10-CM

## 2024-05-08 DIAGNOSIS — D6959 Other secondary thrombocytopenia: Secondary | ICD-10-CM | POA: Insufficient documentation

## 2024-05-08 DIAGNOSIS — E1122 Type 2 diabetes mellitus with diabetic chronic kidney disease: Secondary | ICD-10-CM | POA: Diagnosis not present

## 2024-05-08 DIAGNOSIS — R739 Hyperglycemia, unspecified: Secondary | ICD-10-CM

## 2024-05-08 DIAGNOSIS — C799 Secondary malignant neoplasm of unspecified site: Secondary | ICD-10-CM

## 2024-05-08 DIAGNOSIS — D61818 Other pancytopenia: Secondary | ICD-10-CM

## 2024-05-08 DIAGNOSIS — T50905A Adverse effect of unspecified drugs, medicaments and biological substances, initial encounter: Secondary | ICD-10-CM | POA: Insufficient documentation

## 2024-05-08 DIAGNOSIS — Z5111 Encounter for antineoplastic chemotherapy: Secondary | ICD-10-CM | POA: Insufficient documentation

## 2024-05-08 DIAGNOSIS — Z79811 Long term (current) use of aromatase inhibitors: Secondary | ICD-10-CM

## 2024-05-08 DIAGNOSIS — Z1721 Progesterone receptor positive status: Secondary | ICD-10-CM | POA: Diagnosis not present

## 2024-05-08 DIAGNOSIS — C7951 Secondary malignant neoplasm of bone: Secondary | ICD-10-CM | POA: Diagnosis not present

## 2024-05-08 DIAGNOSIS — C7800 Secondary malignant neoplasm of unspecified lung: Secondary | ICD-10-CM | POA: Diagnosis not present

## 2024-05-08 LAB — CMP (CANCER CENTER ONLY)
ALT: 44 U/L (ref 0–44)
AST: 49 U/L — ABNORMAL HIGH (ref 15–41)
Albumin: 4 g/dL (ref 3.5–5.0)
Alkaline Phosphatase: 78 U/L (ref 38–126)
Anion gap: 9 (ref 5–15)
BUN: 21 mg/dL (ref 8–23)
CO2: 19 mmol/L — ABNORMAL LOW (ref 22–32)
Calcium: 8.8 mg/dL — ABNORMAL LOW (ref 8.9–10.3)
Chloride: 110 mmol/L (ref 98–111)
Creatinine: 1.07 mg/dL — ABNORMAL HIGH (ref 0.44–1.00)
GFR, Estimated: 52 mL/min — ABNORMAL LOW
Glucose, Bld: 148 mg/dL — ABNORMAL HIGH (ref 70–99)
Potassium: 4.4 mmol/L (ref 3.5–5.1)
Sodium: 138 mmol/L (ref 135–145)
Total Bilirubin: 0.3 mg/dL (ref 0.0–1.2)
Total Protein: 8 g/dL (ref 6.5–8.1)

## 2024-05-08 LAB — CBC WITH DIFFERENTIAL/PLATELET
Abs Immature Granulocytes: 0 K/uL (ref 0.00–0.07)
Basophils Absolute: 0.1 K/uL (ref 0.0–0.1)
Basophils Relative: 2 %
Eosinophils Absolute: 0.1 K/uL (ref 0.0–0.5)
Eosinophils Relative: 1 %
HCT: 25 % — ABNORMAL LOW (ref 36.0–46.0)
Hemoglobin: 8.5 g/dL — ABNORMAL LOW (ref 12.0–15.0)
Immature Granulocytes: 0 %
Lymphocytes Relative: 47 %
Lymphs Abs: 1.8 K/uL (ref 0.7–4.0)
MCH: 25.4 pg — ABNORMAL LOW (ref 26.0–34.0)
MCHC: 34 g/dL (ref 30.0–36.0)
MCV: 74.6 fL — ABNORMAL LOW (ref 80.0–100.0)
Monocytes Absolute: 0.5 K/uL (ref 0.1–1.0)
Monocytes Relative: 12 %
Neutro Abs: 1.4 K/uL — ABNORMAL LOW (ref 1.7–7.7)
Neutrophils Relative %: 38 %
Platelets: 70 K/uL — ABNORMAL LOW (ref 150–400)
RBC: 3.35 MIL/uL — ABNORMAL LOW (ref 3.87–5.11)
RDW: 14 % (ref 11.5–15.5)
WBC: 3.8 K/uL — ABNORMAL LOW (ref 4.0–10.5)
nRBC: 0 % (ref 0.0–0.2)

## 2024-05-08 LAB — TSH: TSH: 1.12 u[IU]/mL (ref 0.350–4.500)

## 2024-05-08 LAB — SAMPLE TO BLOOD BANK

## 2024-05-08 MED ORDER — PALBOCICLIB 100 MG PO CAPS
100.0000 mg | ORAL_CAPSULE | Freq: Every day | ORAL | 3 refills | Status: DC
  Filled 2024-05-08: qty 21, 28d supply, fill #0

## 2024-05-08 MED ORDER — PALBOCICLIB 100 MG PO TABS
100.0000 mg | ORAL_TABLET | Freq: Every day | ORAL | 3 refills | Status: AC
  Filled 2024-05-08: qty 21, 28d supply, fill #0
  Filled 2024-06-02 – 2024-06-06 (×2): qty 21, 28d supply, fill #1

## 2024-05-08 MED ORDER — FULVESTRANT 250 MG/5ML IM SOSY
500.0000 mg | PREFILLED_SYRINGE | Freq: Once | INTRAMUSCULAR | Status: AC
  Administered 2024-05-08: 500 mg via INTRAMUSCULAR
  Filled 2024-05-08: qty 10

## 2024-05-08 NOTE — Progress Notes (Signed)
 [facility] Cancer Follow up:    Catherine Cramp, NP [address] [name] [address]   DIAGNOSIS:  Cancer Staging  Left ovarian epithelial cancer [facility]) Staging form: Ovary, Fallopian Tube, and Primary Peritoneal Carcinoma, AJCC 8th Edition - Clinical stage from 05/14/2023: FIGO Stage IIIC (cT3c, cN1, cM0) - Signed by Catherine Hicks, MD on 05/14/2023 Stage prefix: Initial diagnosis  Malignant neoplasm of lower-inner quadrant of right breast of female, estrogen receptor positive (HCC) Staging form: Breast, AJCC 8th Edition - Pathologic stage from 05/07/2018: Stage IB (pT2, pN2mi, cM0, G2, ER+, PR+, HER2-) - Signed by Catherine Morna Pickle, NP on 04/07/2019 Histologic grading system: 3 grade system    SUMMARY OF ONCOLOGIC HISTORY: 79 y.o. Catherine Lowery, [name] woman status post right breast lower inner quadrant biopsy 03/18/2018 for a clinical T2N0, [name], grade 2 or 3, estrogen and progesterone receptor positive, [name], with an MIB-1 of 5%   (1) Genetic testing performed through [facility] Common Hereditary Cancers Panel + Thyroid  Cancer Panel on 05/09/2018 showing no deleterious mutations APC, [name], [name], [name], [name], [name], [name], [name], [name], [name], [name], [name], [name], [name], [name], [name], [name], [name], [name], [name], [name], KIT, MEN1, [name], [name], [name], [name], [name], [name], [name], [name], [name], [name], [name], [name], [name], POLE, [name], [name], RAD50, [name], [name], [name], [name], [name], [name], [name], [name], [name], [name], [name], [name], [name], [name], [name], VHL.              (a) Two variants of uncertain significance in the genes [name] c.1176C>T (Silent) and [name] c.1419+4C>T (Intronic) were identified.   (2) right lumpectomy and sentinel lymph node sampling 05/07/2018 showed a pT2 pN1, [name], grade 2, with a positive inferior margin             (a) reexcision scheduled for 06/05/2018    (3) MammaPrint obtained from the definitive surgical sample read as low risk predicting a disease-free survival at 5 years of 97.8% without need of chemotherapy.   (4) adjuvant radiation 07/11/2018 - 08/27/2018             (a) 1. Right breast; [name]; [name]; 6 fractions of 2 Gy for a total of 12 Gy   (5) anastrozole  started 10/02/2018-discotninued in 07/2023             (a) bone density 05/05/2021, [name], normal imits       (6) genetic testing 05/18/2018 through the Common Hereditary Cancers Panel + Thyroid  Caner panel showed no deleterious mutations in APC, [name], [name], [name], [name], [name], [name], [name], [name], [name], [name], [name], [name], [name], [name], [name], [name], [name], [name], [name], [name], KIT, MEN1, [name], [name], [name], [name], [name], [name], [name], [name], [name], [name], [name], [name], [name], POLE, [name], [name], RAD50, [name], [name], [name], [name], [name], [name], [name], [name], [name], [name], [name], [name], [name], [name], [name], VHL.             (a) 2 variants of uncertain significance in the genes [name] c.1176C>T (Silent) and [name] c.1419+4C>T (Intronic) were identified.               (  b) UPDATE: The [name] c.1419+4C>T (Intronic) VUS has been reclassified to Likely Benign. The report date is 08/08/2018.   (7) the patient has thalassemia/sickle cell trait: Hemoglobin electrophoresis 06/23/2019 shows hemoglobin 87.8%, [name], hemoglobin A2 elevated at 3.6%  OVARIAN CANCER and metastatic breast cancer:  She was admitted int he hospital 05/09/2023 with abdmonial pain, bloating, and constipation, with symptoms and imaging worrisome for ovarian cancer  Omental biopsy on 05/10/2023 consitent with stage IIIC ovarian cancer  Neoadjuvant chemotherapy with Carbo/Taxol  given every 3 weeks x 6 cycles   TAH with  BSO and radical debulking, omentectomy on 08/21/2023, lobular cancer consistent with breast primary noted in the omentum on final pathology.    Letrozole  2.5mg  daily + Verzenio  BID began in 08/2023-03/2024  Invasolib 6mg  daily + [name]  125mg  daily began end of Nov 03/2024  Fulvestrant  added on 04/07/2024  CURRENT THERAPY: Invasolib, [name] , [name], who gave verbal consent to proceed.  History of Present Illness  Catherine Lowery is an 79 year old woman with metastatic ER-positive right breast cancer complicated by drug-induced thrombocytopenia who presents for oncology follow-up and medication management.  She is currently receiving palbociclib  125 mg daily (three weeks on, one week off), enablasib two tablets daily, and a monthly injection. She began the current palbociclib  cycle seven days ago and started a new supply this morning. She has been on enablasib for just over a month. She denies diarrhea, oral mucositis, or cutaneous rash. She reports persistent tremor, which is baseline, and no increased fatigue. She experiences rhinorrhea, altered taste, and early satiety, often losing interest in food after a few bites. Previous nasal dryness and epistaxis have improved with increased home humidity.  Her platelet count is currently 70,000, the lowest in recent months. She is otherwise tolerating therapy without acute complications.  She monitors blood glucose daily. Recent hyperglycemia (peak 227 mg/dL) led to a temporary hold of her diabetes medication, which was resumed after normalization. Current readings are 148 mg/dL and 864 mg/dL (non-fasting). She denies new symptoms related to diabetes.  She was referred to a dietitian in December 2025 for changes in taste and appetite but has not yet connected with the dietitian.   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   Multiple thyroid  nodules 06/01/2021   H/O migraine 07/01/2019   Bradycardia 12/17/2018   Peripheral edema 12/17/2018   Genetic testing 05/20/2018   Family history of breast cancer    Family history of prostate cancer    Family history of ovarian cancer    Malignant neoplasm of lower-inner quadrant of right breast of female, estrogen receptor positive (HCC) 04/11/2018   DNR (do not resuscitate) 05/22/2017   Benign familial tremor 05/16/2016   Vitamin D  deficiency 03/01/2016   Primary localized osteoarthritis of left hip 12/29/2015   Encounter for screening colonoscopy 02/18/2015   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 02/04/2015  Hyperlipidemia 01/12/2015   Essential hypertension 01/12/2015   Tobacco abuse 01/12/2015    is allergic to morphine and codeine and percocet [oxycodone -acetaminophen ].  MEDICAL HISTORY: Past Medical History:  Diagnosis Date   Cancer (HCC) 03/2018   right breast cancer    ovarian   Chronic kidney disease    CKD   COPD (chronic obstructive pulmonary disease) (HCC)    DJD (degenerative joint disease)    LEFT HIP   Family history of breast cancer    Family history of ovarian cancer    Family history of prostate cancer    Family history of prostate cancer    H/O migraine 07/01/2019   Hyperlipidemia    takes Simvasatin daily   Hypertension    takes Propranolol  and HCTZ daioly    Hyperthyroidism    Pneumonia    x2   PONV (postoperative nausea and vomiting)    when ether used   Reactive airway disease 05/08/2023   Tremors of nervous system    takes Primidone  daily    SURGICAL HISTORY: Past Surgical History:  Procedure Laterality Date   BOne Spur     left heel   BOWEL RESECTION N/A 08/21/2023   Procedure: SMALL BOWEL RESECTION WITH SIDE-TO-SIDE, FUNCTIONAL END-TO-END ANASTOMOSIS;  Surgeon: Eldonna Mays, MD;  Location: [facility];  Service: Gynecology;  Laterality: N/A;   BREAST LUMPECTOMY WITH RADIOACTIVE SEED AND SENTINEL LYMPH NODE BIOPSY Right 05/07/2018   Procedure: RIGHT BREAST LUMPECTOMY WITH RADIOACTIVE SEED AND RIGHT  SENTINEL LYMPH NODE MAPPING;  Surgeon: Vanderbilt Ned, MD;  Location: [facility];  Service: General;  Laterality: Right;   COLONOSCOPY     IR IMAGING GUIDED PORT INSERTION  05/10/2023   IR US  GUIDE BX ASP/DRAIN  05/10/2023   JOINT REPLACEMENT Bilateral    knee   KNEE SURGERY Left    lateral muscle release in [employer]   OMENTECTOMY N/A 08/21/2023   Procedure: OMENTECTOMY;  Surgeon: Eldonna Mays, MD;  Location: [facility];  Service: Gynecology;  Laterality: N/A;  via robot approach   RE-EXCISION OF BREAST LUMPECTOMY Right 06/05/2018   Procedure: RE-EXCISION OF RIGHT BREAST LUMPECTOMY;  Surgeon: Vanderbilt Ned, MD;  Location:  SURGERY CENTER;  Service: General;  Laterality: Right;   ROBOTIC ASSISTED TOTAL HYSTERECTOMY WITH BILATERAL SALPINGO OOPHERECTOMY N/A 08/21/2023   Procedure: ROBOTIC ASSISTED TOTAL LAPAROSCOPY HYSTERECTOMY WITH BILATERAL SALPINGO-OOPHORECTOMY, RADICAL DISSECTION FOR DEBULKING;  Surgeon: Eldonna Mays, MD;  Location: [facility];  Service: Gynecology;  Laterality: N/A;   ROTATOR CUFF REPAIR Bilateral    TOTAL HIP ARTHROPLASTY Left 12/29/2015   Procedure: TOTAL HIP ARTHROPLASTY ANTERIOR APPROACH;  Surgeon: Toribio JULIANNA Chancy, MD;  Location: [facility];  Service: Orthopedics;  Laterality: Left;    SOCIAL  HISTORY: Social History   Socioeconomic History   Marital status: Single    Spouse name: Not on file   Number of children: 1   Years of education: 14   Highest education level: Not on file  Occupational History    Comment: retired  Tobacco Use   Smoking status: Former    Current packs/day: 0.75    Average packs/day: 0.8 packs/day for 28.0 years (21.0 ttl pk-yrs)    Types: Cigarettes    Passive exposure: Never   Smokeless tobacco: Never   Tobacco comments:    02/13/22 Pt stated I'm trying to quit Archie JONELLE Che, CMA     Quit February 2025  Vaping Use   Vaping status: Never Used  Substance and Sexual Activity  Alcohol use: No    Alcohol/week: 0.0 standard drinks of alcohol   Drug use: No   Sexual activity: Not Currently    Birth control/protection: Post-menopausal  Other Topics Concern   Not on file  Social History Narrative   Lives with handicapped son   Caffeine- coffee, 2 cups daily, 1-2 sodas daily   Social Drivers of Health   Tobacco Use: Medium Risk (04/21/2024)   Patient History    Smoking Tobacco Use: Former    Smokeless Tobacco Use: Never    Passive Exposure: Never  Physicist, Medical Strain: Low Risk (04/21/2024)   Overall Financial Resource Strain (CARDIA)    Difficulty of Paying Living Expenses: Not hard at all  Food Insecurity: No Food Insecurity (04/21/2024)   Epic    Worried About Radiation Protection Practitioner of Food in the Last Year: Never true    Ran Out of Food in the Last Year: Never true  Transportation Needs: No Transportation Needs (04/21/2024)   Epic    Lack of Transportation (Medical): No    Lack of Transportation (Non-Medical): No  Physical Activity: Inactive (04/21/2024)   Exercise Vital Sign    Days of Exercise per Week: 0 days    Minutes of Exercise per Session: 0 min  Stress: No Stress Concern Present (04/21/2024)   [employer] of Occupational Health - Occupational Stress Questionnaire    Feeling of Stress: Not at all  Social  Connections: Moderately Isolated (04/21/2024)   Social Connection and Isolation Panel    Frequency of Communication with Friends and Family: More than three times a week    Frequency of Social Gatherings with Friends and Family: More than three times a week    Attends Religious Services: 1 to 4 times per year    Active Member of Clubs or Organizations: No    Attends Banker Meetings: Never    Marital Status: Divorced  Catering Manager Violence: Not At Risk (04/21/2024)   Epic    Fear of Current or Ex-Partner: No    Emotionally Abused: No    Physically Abused: No    Sexually Abused: No  Depression (PHQ2-9): Low Risk (05/08/2024)   Depression (PHQ2-9)    PHQ-2 Score: 0  Alcohol Screen: Low Risk (04/21/2024)   Alcohol Screen    Last Alcohol Screening Score (AUDIT): 0  Housing: Unknown (04/21/2024)   Epic    Unable to Pay for Housing in the Last Year: No    Number of Times Moved in the Last Year: Not on file    Homeless in the Last Year: Not on file  Utilities: Not At Risk (04/21/2024)   Epic    Threatened with loss of utilities: No  Health Literacy: Adequate Health Literacy (04/21/2024)   B1300 Health Literacy    Frequency of need for help with medical instructions: Never    FAMILY HISTORY: Family History  Problem Relation Age of Onset   Hypertension Mother    Heart disease Mother    Stroke Mother    Multiple myeloma Father 41   Thyroid  cancer Sister        dx 40's/50's   Cancer Paternal Aunt        unknown   Prostate cancer Paternal Uncle        dx >50- cancer was the cause of his death   Ovarian cancer Paternal Aunt    Throat cancer Paternal Uncle    Breast cancer Cousin        dx >50  Cancer Cousin        type unk   Cancer Cousin        type unk   Cancer Cousin        type unk    Review of Systems  Constitutional:  Negative for appetite change, chills, fatigue, fever and unexpected weight change.  HENT:   Negative for hearing loss, lump/mass and  trouble swallowing.   Eyes:  Negative for eye problems and icterus.  Respiratory:  Negative for chest tightness, cough and shortness of breath.   Cardiovascular:  Negative for chest pain, leg swelling and palpitations.  Gastrointestinal:  Negative for abdominal distention, abdominal pain, constipation, diarrhea, nausea and vomiting.  Endocrine: Negative for hot flashes.  Genitourinary:  Negative for difficulty urinating.   Musculoskeletal:  Negative for arthralgias.  Skin:  Negative for itching and rash.  Neurological:  Negative for dizziness, extremity weakness, headaches and numbness.  Hematological:  Negative for adenopathy. Does not bruise/bleed easily.  Psychiatric/Behavioral:  Negative for depression. The patient is not nervous/anxious.       PHYSICAL EXAMINATION   Onc Performance Status - 05/08/24 1054       ECOG Perf Status   ECOG Perf Status Restricted in physically strenuous activity but ambulatory and able to carry out work of a light or sedentary nature, e.g., light house work, office work      KPS SCALE   KPS % SCORE Able to carry on normal activity, minor s/s of disease          Vitals:   05/08/24 1053  BP: (!) 110/58  Pulse: (!) 53  Resp: 17  Temp: 98.4 F (36.9 C)  SpO2: 99%    Physical Exam Constitutional:      General: She is not in acute distress.    Appearance: Normal appearance. She is not toxic-appearing.  HENT:     Head: Normocephalic and atraumatic.     Mouth/Throat:     Mouth: Mucous membranes are moist.     Pharynx: Oropharynx is clear. No oropharyngeal exudate or posterior oropharyngeal erythema.  Eyes:     General: No scleral icterus. Cardiovascular:     Rate and Rhythm: Normal rate and regular rhythm.     Pulses: Normal pulses.     Heart sounds: Normal heart sounds.  Pulmonary:     Effort: Pulmonary effort is normal.     Breath sounds: Normal breath sounds.  Abdominal:     General: Abdomen is flat. Bowel sounds are normal. There  is no distension.     Palpations: Abdomen is soft.     Tenderness: There is no abdominal tenderness.  Musculoskeletal:        General: No swelling.     Cervical back: Neck supple.  Lymphadenopathy:     Cervical: No cervical adenopathy.  Skin:    General: Skin is warm and dry.     Findings: No rash.  Neurological:     General: No focal deficit present.     Mental Status: She is alert.  Psychiatric:        Mood and Affect: Mood normal.        Behavior: Behavior normal.     LABORATORY DATA:  CBC    Component Value Date/Time   WBC 3.8 (L) 05/08/2024 1026   RBC 3.35 (L) 05/08/2024 1026   HGB 8.5 (L) 05/08/2024 1026   HGB 9.3 (L) 08/13/2023 1010   HCT 25.0 (L) 05/08/2024 1026   PLT 70 (L) 05/08/2024 1026  PLT 122 (L) 08/13/2023 1010   MCV 74.6 (L) 05/08/2024 1026   MCH 25.4 (L) 05/08/2024 1026   MCHC 34.0 05/08/2024 1026   RDW 14.0 05/08/2024 1026   LYMPHSABS 1.8 05/08/2024 1026   MONOABS 0.5 05/08/2024 1026   EOSABS 0.1 05/08/2024 1026   BASOSABS 0.1 05/08/2024 1026    CMP     Component Value Date/Time   NA 138 05/08/2024 1026   NA 139 11/22/2020 0000   K 4.4 05/08/2024 1026   CL 110 05/08/2024 1026   CO2 19 (L) 05/08/2024 1026   GLUCOSE 148 (H) 05/08/2024 1026   BUN 21 05/08/2024 1026   CREATININE 1.07 (H) 05/08/2024 1026   CREATININE 1.05 (H) 05/19/2019 1034   CALCIUM  8.8 (L) 05/08/2024 1026   PROT 8.0 05/08/2024 1026   ALBUMIN  4.0 05/08/2024 1026   AST 49 (H) 05/08/2024 1026   ALT 44 05/08/2024 1026   ALKPHOS 78 05/08/2024 1026   BILITOT 0.3 05/08/2024 1026   GFRNONAA 52 (L) 05/08/2024 1026   GFRNONAA 48 (L) 01/23/2018 0804   GFRAA >60 06/23/2019 1524   GFRAA 56 (L) 01/23/2018 0804     ASSESSMENT and THERAPY PLAN:   No problem-specific Assessment & Plan notes found for this encounter.   Assessment and Plan Assessment & Plan Metastatic estrogen receptor positive breast cancer with bone and lung metastases On Inavolisib ,faslodex  and Ibrance  -  Continued monthly injection as scheduled. - Held palbociclib  for one additional week due to thrombocytopenia, then to resume. - Reduced palbociclib  dose to 100 mg for next prescription to minimize risk of recurrent thrombocytopenia. - Ordered scan at end of January or early February to assess treatment response. - Deferred lung lesion biopsy unless lack of response to therapy. - Requested dietitian to contact her regarding dietary concerns.  Drug-induced thrombocytopenia Thrombocytopenia likely secondary to palbociclib , with platelet count at 70,000. No bleeding or acute complications observed. - Held palbociclib  for one additional week due to thrombocytopenia. - Reduced palbociclib  dose to 100 mg for next prescription to reduce risk of recurrent thrombocytopenia.  Type 2 diabetes mellitus Type 2 diabetes with recent hyperglycemia, likely exacerbated by cancer therapy. . Blood glucose now at 148 mg/dL. - Continued metformin two tablets daily. - Continued daily home blood glucose monitoring. - Requested dietitian to contact her for dietary counseling.  All questions were answered. The patient knows to call the clinic with any problems, questions or concerns. We can certainly see the patient much sooner if necessary.  Total encounter time:44minutes*in face-to-face visit time, chart review, lab review, care coordination, order entry, and documentation of the encounter time.  *Total Encounter Time as defined by the Centers for Medicare and Medicaid Services includes, in addition to the face-to-face time of a patient visit (documented in the note above) non-face-to-face time: obtaining and reviewing outside history, ordering and reviewing medications, tests or procedures, care coordination (communications with other health care professionals or caregivers) and documentation in the medical record.

## 2024-05-08 NOTE — Progress Notes (Signed)
 Per pharmacy [facility] at [facility], need to send palbociclib  tablets as capsules not in stock. [name]  100 mg capsules discontinued, tablets sent. DDIs with palbociclib  reviewed prior by Dr. Loretha.  Jaydah Stahle A. Lucila, PharmD, BCOP, CPP 05/08/2024 2:31 PM

## 2024-05-08 NOTE — Telephone Encounter (Signed)
 Patient referral for weight loss . Second attempt to reach. Home # answered but no one responded.  Mobile didn't have voice mail set up. Provided my cell# on sister Louise's home voice mail to return call to set up a remote nutrition consult.  Micheline Craven, RDN, LDN Registered Dietitian, [facility] Part Time Remote (Usual office hours: Tuesday-Thursday) Cell: [phone]

## 2024-05-09 LAB — CANCER ANTIGEN 27.29: CA 27.29: 30.8 U/mL (ref 0.0–38.6)

## 2024-05-12 ENCOUNTER — Other Ambulatory Visit: Payer: Self-pay

## 2024-05-12 NOTE — Progress Notes (Signed)
 Specialty Pharmacy Refill Coordination Note  Catherine Lowery is a 80 y.o. female contacted today regarding refills of specialty medication(s) [name]  (IBRANCE )   Patient requested Delivery   Delivery date: 05/13/24   Verified address: [address]  [place]atherine Lower[place] [id number]   Medication will be filled on: 05/12/24

## 2024-05-23 ENCOUNTER — Encounter (HOSPITAL_COMMUNITY)
Admission: RE | Admit: 2024-05-23 | Discharge: 2024-05-23 | Disposition: A | Source: Ambulatory Visit | Attending: Hematology and Oncology

## 2024-05-23 DIAGNOSIS — Z170 Estrogen receptor positive status [ER+]: Secondary | ICD-10-CM | POA: Insufficient documentation

## 2024-05-23 DIAGNOSIS — C50311 Malignant neoplasm of lower-inner quadrant of right female breast: Secondary | ICD-10-CM | POA: Diagnosis present

## 2024-05-23 DIAGNOSIS — C799 Secondary malignant neoplasm of unspecified site: Secondary | ICD-10-CM | POA: Insufficient documentation

## 2024-05-23 LAB — GLUCOSE, CAPILLARY: Glucose-Capillary: 215 mg/dL — ABNORMAL HIGH (ref 70–99)

## 2024-05-23 MED ORDER — FLUDEOXYGLUCOSE F - 18 (FDG) INJECTION
7.2520 | Freq: Once | INTRAVENOUS | Status: AC
  Administered 2024-05-23: 7.252 via INTRAVENOUS

## 2024-05-24 ENCOUNTER — Other Ambulatory Visit (HOSPITAL_COMMUNITY): Payer: Self-pay

## 2024-05-27 ENCOUNTER — Other Ambulatory Visit: Payer: Self-pay | Admitting: Pharmacist

## 2024-05-27 ENCOUNTER — Other Ambulatory Visit (HOSPITAL_COMMUNITY): Payer: Self-pay

## 2024-05-27 ENCOUNTER — Telehealth: Payer: Self-pay | Admitting: *Deleted

## 2024-05-27 ENCOUNTER — Inpatient Hospital Stay: Admitting: Dietician

## 2024-05-27 ENCOUNTER — Other Ambulatory Visit: Payer: Self-pay

## 2024-05-27 MED ORDER — BLOOD GLUCOSE TEST VI STRP
ORAL_STRIP | 0 refills | Status: AC
  Filled 2024-05-27: qty 100, 90d supply, fill #0

## 2024-05-27 NOTE — Telephone Encounter (Signed)
 Catherine Lowery called to request a prescription for more Glucose Test Strips. She stated she's been checking daily per the office instructions, instead of every 3 days and she's running out of test strips. She said her glucose this am was 243 and she was to call if over 166. Message shared with Norleen Parkinson, RPH-CPP who was prescriber for original rx for test strips.  He stated he would send refill for strips.  Contacted Catherine Lowery @ [phone] by her request (staying with sister, this is sister's phone#) to inform her rx was sent to pharmacy.  She verbalized understanding that rx sent to pharmacy.

## 2024-05-27 NOTE — Progress Notes (Signed)
 Nutrition Follow-up:   Pt with breast and ovarian cancer.   Patient reports good appetite. She continues to eat 3 meals a day.  Denies barriers to PO intake or NIS other than some diarrhea which she treats with Imodium. Had 3 loose stools Saturday. PO intake yesterday:  Breakfast: Waffle, sausage, strawberry jam.  Lunch: Broccoli soup. Dinner: Roast beef green cornbread   Water, doesn't like Gatorade, will drink some Pedialyte, Boost Plus      Medications: MVI, K+, no longer taking oral iron (reports it promotes diarrhea)   Labs: 05/08/24  Ca 8.8, GFR 52, Hgb 8.5  Anthropometrics:  Weight loss 6# (4%) last month, not yet clinically significant      NUTRITION DIAGNOSIS: Unintended wt loss improving, Improving with reported PO     MALNUTRITION DIAGNOSIS: Severe malnutrition. Ongoing     INTERVENTION:   Reviewed goals for Anemia.  Encouraged increased milk and yogurt to BID.  Encouraged electrolyte replacement drinks if greater than 3 loose stools.  Encouraged soluble fibers for solidifying stools.  Encouraged ONS as needed for weight maintenance.   MONITORING, EVALUATION, GOAL: wt trends, intake     NEXT VISIT: PRN at patient or provider request

## 2024-05-28 ENCOUNTER — Other Ambulatory Visit (HOSPITAL_COMMUNITY): Payer: Self-pay

## 2024-05-29 ENCOUNTER — Telehealth: Payer: Self-pay

## 2024-05-29 ENCOUNTER — Other Ambulatory Visit: Payer: Self-pay | Admitting: Hematology and Oncology

## 2024-05-29 ENCOUNTER — Other Ambulatory Visit: Payer: Self-pay

## 2024-05-29 MED ORDER — INAVOLISIB 3 MG PO TABS
6.0000 mg | ORAL_TABLET | Freq: Every day | ORAL | 1 refills | Status: AC
  Filled 2024-05-29 – 2024-06-06 (×3): qty 56, 28d supply, fill #0

## 2024-05-29 NOTE — Telephone Encounter (Signed)
 Received VM from pt stating that her CBG this morning was 268. RN attempted to call back pt to further assess her but no answer. LVM instructing pt to call back.

## 2024-05-30 ENCOUNTER — Other Ambulatory Visit: Payer: Self-pay

## 2024-06-02 ENCOUNTER — Other Ambulatory Visit: Payer: Self-pay

## 2024-06-04 ENCOUNTER — Other Ambulatory Visit: Payer: Self-pay

## 2024-06-04 ENCOUNTER — Other Ambulatory Visit (HOSPITAL_COMMUNITY): Payer: Self-pay

## 2024-06-05 ENCOUNTER — Inpatient Hospital Stay: Attending: Psychiatry | Admitting: Hematology and Oncology

## 2024-06-05 ENCOUNTER — Inpatient Hospital Stay

## 2024-06-05 VITALS — BP 144/57 | HR 57 | Temp 97.7°F | Resp 17 | Ht 69.5 in | Wt 148.7 lb

## 2024-06-05 DIAGNOSIS — C50311 Malignant neoplasm of lower-inner quadrant of right female breast: Secondary | ICD-10-CM

## 2024-06-05 DIAGNOSIS — D61818 Other pancytopenia: Secondary | ICD-10-CM

## 2024-06-05 DIAGNOSIS — Z170 Estrogen receptor positive status [ER+]: Secondary | ICD-10-CM

## 2024-06-05 DIAGNOSIS — Z79811 Long term (current) use of aromatase inhibitors: Secondary | ICD-10-CM

## 2024-06-05 DIAGNOSIS — C562 Malignant neoplasm of left ovary: Secondary | ICD-10-CM

## 2024-06-05 LAB — CBC WITH DIFFERENTIAL/PLATELET
Abs Immature Granulocytes: 0.02 10*3/uL (ref 0.00–0.07)
Basophils Absolute: 0.1 10*3/uL (ref 0.0–0.1)
Basophils Relative: 2 %
Eosinophils Absolute: 0 10*3/uL (ref 0.0–0.5)
Eosinophils Relative: 1 %
HCT: 23.5 % — ABNORMAL LOW (ref 36.0–46.0)
Hemoglobin: 7.9 g/dL — ABNORMAL LOW (ref 12.0–15.0)
Immature Granulocytes: 1 %
Lymphocytes Relative: 47 %
Lymphs Abs: 1.4 10*3/uL (ref 0.7–4.0)
MCH: 25.4 pg — ABNORMAL LOW (ref 26.0–34.0)
MCHC: 33.6 g/dL (ref 30.0–36.0)
MCV: 75.6 fL — ABNORMAL LOW (ref 80.0–100.0)
Monocytes Absolute: 0.3 10*3/uL (ref 0.1–1.0)
Monocytes Relative: 10 %
Neutro Abs: 1.1 10*3/uL — ABNORMAL LOW (ref 1.7–7.7)
Neutrophils Relative %: 39 %
Platelets: 65 10*3/uL — ABNORMAL LOW (ref 150–400)
RBC: 3.11 MIL/uL — ABNORMAL LOW (ref 3.87–5.11)
RDW: 13.4 % (ref 11.5–15.5)
Smear Review: NORMAL
WBC: 2.8 10*3/uL — ABNORMAL LOW (ref 4.0–10.5)
nRBC: 0 % (ref 0.0–0.2)

## 2024-06-05 LAB — CMP (CANCER CENTER ONLY)
ALT: 11 U/L (ref 0–44)
AST: 16 U/L (ref 15–41)
Albumin: 3.7 g/dL (ref 3.5–5.0)
Alkaline Phosphatase: 64 U/L (ref 38–126)
Anion gap: 11 (ref 5–15)
BUN: 19 mg/dL (ref 8–23)
CO2: 24 mmol/L (ref 22–32)
Calcium: 8.7 mg/dL — ABNORMAL LOW (ref 8.9–10.3)
Chloride: 104 mmol/L (ref 98–111)
Creatinine: 1.12 mg/dL — ABNORMAL HIGH (ref 0.44–1.00)
GFR, Estimated: 49 mL/min — ABNORMAL LOW
Glucose, Bld: 311 mg/dL — ABNORMAL HIGH (ref 70–99)
Potassium: 4.1 mmol/L (ref 3.5–5.1)
Sodium: 139 mmol/L (ref 135–145)
Total Bilirubin: 0.2 mg/dL (ref 0.0–1.2)
Total Protein: 7.2 g/dL (ref 6.5–8.1)

## 2024-06-05 LAB — SAMPLE TO BLOOD BANK

## 2024-06-05 LAB — TSH: TSH: 2.2 u[IU]/mL (ref 0.350–4.500)

## 2024-06-05 MED ORDER — FULVESTRANT 250 MG/5ML IM SOSY
500.0000 mg | PREFILLED_SYRINGE | Freq: Once | INTRAMUSCULAR | Status: AC
  Administered 2024-06-05: 500 mg via INTRAMUSCULAR
  Filled 2024-06-05: qty 10

## 2024-06-05 NOTE — Research (Signed)
 Effectiveness of Out-of-Pocket Psychologist, Forensic ([employer]) in Cancer Patients  Patient completed 12 month questionnaire in clinic before seeing Dr Loretha. She has no further questions or concerns. Patient was thanked for her time and willingness to participate in research.  Andrea MORTON Bubba Vanbenschoten, RN, BSN, [facility] She  Her  Hers Clinical Research Nurse [facility] Direct Dial [phone] 06/05/2024 12:07 PM

## 2024-06-05 NOTE — Progress Notes (Signed)
 [facility] Cancer Follow up:    Catherine Cramp, NP [address] [place] [address]   DIAGNOSIS:  Cancer Staging  Left ovarian epithelial cancer [facility]) Staging form: Ovary, Fallopian Tube, and Primary Peritoneal Carcinoma, AJCC 8th Edition - Clinical stage from 05/14/2023: FIGO Stage IIIC (cT3c, cN1, cM0) - Signed by Lonn Hicks, MD on 05/14/2023 Stage prefix: Initial diagnosis  Malignant neoplasm of lower-inner quadrant of right breast of female, estrogen receptor positive (HCC) Staging form: Breast, AJCC 8th Edition - Pathologic stage from 05/07/2018: Stage IB (pT2, pN76mi, cM0, G2, ER+, PR+, HER2-) - Signed by Crawford Morna Pickle, NP on 04/07/2019 Histologic grading system: 3 grade system    SUMMARY OF ONCOLOGIC HISTORY: 79 y.o. Catherine Lowery, Catherine Lowery status post right breast lower inner quadrant biopsy 03/18/2018 for a clinical T2N0, [name], grade 2 or 3, estrogen and progesterone receptor positive, [name], with an MIB-1 of 5%   (1) Genetic testing performed through [facility] Common Hereditary Cancers Panel + Thyroid  Cancer Panel on 05/09/2018 showing no deleterious mutations APC, [name], [name], [name], [name], [name], [name], [name], [name], [name], [name], [name], [name], [name], [name], [name], [name], [name], [name], [name], [name], KIT, MEN1, [name], [name], [name], [name], [name], [name], [name], [name], [name], [name], [name], [name], [name], POLE, [name], [name], RAD50, [name], [name], [name], [name], [name], [name], [name], [name], [name], [name], [name], [name], [name], [name], [name], VHL.              (a) Two variants of uncertain significance in the genes [name] c.1176C>T (Silent) and [name] c.1419+4C>T (Intronic) were identified.   (2) right lumpectomy and sentinel lymph node sampling 05/07/2018 showed a pT2 pN1, [name], grade 2, with a positive inferior margin             (a) reexcision scheduled for 06/05/2018    (3) MammaPrint obtained from the definitive surgical sample read as low risk predicting a disease-free survival at 5 years of 97.8% without need of chemotherapy.   (4) adjuvant radiation 07/11/2018 - 08/27/2018             (a) 1. Right breast; [name]; [name]; 6 fractions of 2 Gy for a total of 12 Gy   (5) anastrozole  started 10/02/2018-discotninued in 07/2023             (a) bone density 05/05/2021, [name], normal imits       (6) genetic testing 05/18/2018 through the Common Hereditary Cancers Panel + Thyroid  Caner panel showed no deleterious mutations in APC, [name], [name], [name], [name], [name], [name], [name], [name], [name], [name], [name], [name], [name], [name], [name], [name], [name], [name], [name], [name], KIT, MEN1, [name], [name], [name], [name], [name], [name], [name], [name], [name], [name], [name], [name], [name], POLE, [name], [name], RAD50, [name], [name], [name], [name], [name], [name], [name], [name], [name], [name], [name], [name], [name], [name], [name], VHL.             (a) 2 variants of uncertain significance in the genes [name] c.1176C>T (Silent) and [name] c.1419+4C>T (Intronic) were identified.               (  b) UPDATE: The [name] c.1419+4C>T (Intronic) VUS has been reclassified to Likely Benign. The report date is 08/08/2018.   (7) the patient has thalassemia/sickle cell trait: Hemoglobin electrophoresis 06/23/2019 shows hemoglobin 87.8%, [name], hemoglobin A2 elevated at 3.6%  OVARIAN CANCER and metastatic breast cancer:  Catherine Lowery was admitted int he hospital 05/09/2023 with abdmonial pain, bloating, and constipation, with symptoms and imaging worrisome for ovarian cancer  Omental biopsy on 05/10/2023 consitent with stage IIIC ovarian cancer  Neoadjuvant chemotherapy with Carbo/Taxol  given every 3 weeks x 6 cycles   TAH with  BSO and radical debulking, omentectomy on 08/21/2023, lobular cancer consistent with breast primary noted in the omentum on final pathology.    Letrozole  2.5mg  daily + Verzenio  BID began in 08/2023-03/2024  Invasolib 6mg  daily + [name]  125mg  daily began end of Nov 03/2024  Fulvestrant  added on 04/07/2024  CURRENT THERAPY: Invasolib, [name] , [name], [name], palbociclib  (100 mg daily, three weeks on/one week off, with a recent week break), [name], with the most recent scan showing improvement in liver, stomach, [name], [name], [name], specifically raw and sore gums following extraction of four teeth, for which Catherine Lowery uses baking soda rinses. No mouth ulcers are present. Catherine Lowery has been staying at her husband's house for the past two weeks due to cold weather and concerns  [place]. No other new issues since her last visit.  Catherine Lowery completed her most recent cycle of palbociclib  yesterday.  May 08, 2024: Oncology follow-up for metastatic ER-positive right breast cancer and left ovarian epithelial cancer. Medication management included [name]  (held for one week due to drug-induced thrombocytopenia, platelet count 70,000; dose reduced to 100 mg for next cycle), Inavolisib , and Fulvestrant . No acute complications reported. Continued metformin for diabetes with recent hyperglycemia; dietary counseling pending. Planned scan to assess treatment response; lung lesion biopsy deferred unless inadequate response.   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   Multiple  thyroid  nodules 06/01/2021   H/O migraine 07/01/2019   Bradycardia 12/17/2018   Peripheral edema 12/17/2018   Genetic testing 05/20/2018   Family history of breast cancer    Family history of prostate cancer    Family history of ovarian cancer    Malignant neoplasm of lower-inner quadrant of right breast of female, estrogen receptor positive (HCC) 04/11/2018   DNR (do not resuscitate) 05/22/2017   Benign familial tremor 05/16/2016   Vitamin D  deficiency 03/01/2016   Primary localized osteoarthritis of left hip 12/29/2015   Encounter for screening colonoscopy 02/18/2015   Toxic multinodular goiter w/o  crisis 02/04/2015   Hyperlipidemia 01/12/2015   Essential hypertension 01/12/2015   Tobacco abuse 01/12/2015    is allergic to morphine and codeine and percocet [oxycodone -acetaminophen ].  MEDICAL HISTORY: Past Medical History:  Diagnosis Date   Cancer (HCC) 03/2018   right breast cancer    ovarian   Chronic kidney disease    CKD   COPD (chronic obstructive pulmonary disease) (HCC)    DJD (degenerative joint disease)    LEFT HIP   Family history of breast cancer    Family history of ovarian cancer    Family history of prostate cancer    Family history of prostate cancer    H/O migraine 07/01/2019   Hyperlipidemia    takes Simvasatin daily   Hypertension    takes Propranolol  and HCTZ daioly   Hyperthyroidism    Pneumonia    x2   PONV (postoperative nausea and vomiting)    when ether used   Reactive airway disease 05/08/2023   Tremors of nervous system    takes Primidone  daily    SURGICAL HISTORY: Past Surgical History:  Procedure Laterality Date   BOne Spur     left heel   BOWEL RESECTION N/A 08/21/2023   Procedure: SMALL BOWEL RESECTION WITH SIDE-TO-SIDE, FUNCTIONAL END-TO-END ANASTOMOSIS;  Surgeon: Eldonna Mays, MD;  Location: [facility];  Service: Gynecology;  Laterality: N/A;   BREAST LUMPECTOMY WITH RADIOACTIVE SEED AND SENTINEL LYMPH NODE BIOPSY Right 05/07/2018   Procedure: RIGHT BREAST LUMPECTOMY WITH RADIOACTIVE SEED AND RIGHT  SENTINEL LYMPH NODE MAPPING;  Surgeon: Vanderbilt Ned, MD;  Location: [facility];  Service: General;  Laterality: Right;   COLONOSCOPY     IR IMAGING GUIDED PORT INSERTION  05/10/2023   IR US  GUIDE BX ASP/DRAIN  05/10/2023   JOINT REPLACEMENT Bilateral    knee   KNEE SURGERY Left    lateral muscle release in [employer]   OMENTECTOMY N/A 08/21/2023   Procedure: OMENTECTOMY;  Surgeon: Eldonna Mays, MD;  Location: [facility];  Service: Gynecology;  Laterality: N/A;  via robot approach   RE-EXCISION OF BREAST LUMPECTOMY Right  06/05/2018   Procedure: RE-EXCISION OF RIGHT BREAST LUMPECTOMY;  Surgeon: Vanderbilt Ned, MD;  Location: [facility];  Service: General;  Laterality: Right;   ROBOTIC ASSISTED TOTAL HYSTERECTOMY WITH BILATERAL SALPINGO OOPHERECTOMY N/A 08/21/2023   Procedure: ROBOTIC ASSISTED TOTAL LAPAROSCOPY HYSTERECTOMY WITH BILATERAL SALPINGO-OOPHORECTOMY, RADICAL DISSECTION FOR DEBULKING;  Surgeon: Eldonna Mays, MD;  Location: [facility];  Service: Gynecology;  Laterality: N/A;   ROTATOR CUFF REPAIR Bilateral    TOTAL HIP ARTHROPLASTY Left 12/29/2015   Procedure: TOTAL HIP ARTHROPLASTY ANTERIOR APPROACH;  Surgeon: Toribio JULIANNA Chancy, MD;  Location: [facility];  Service: Orthopedics;  Laterality: Left;    SOCIAL HISTORY: Social History   Socioeconomic History   Marital status: Single    Spouse name: Not on file   Number of  children: 1   Years of education: 14   Highest education level: Not on file  Occupational History    Comment: retired  Tobacco Use   Smoking status: Former    Current packs/day: 0.75    Average packs/day: 0.8 packs/day for 28.0 years (21.0 ttl pk-yrs)    Types: Cigarettes    Passive exposure: Never   Smokeless tobacco: Never   Tobacco comments:    02/13/22 Pt stated I'm trying to quit Archie JONELLE Che, CMA     Quit February 2025  Vaping Use   Vaping status: Never Used  Substance and Sexual Activity   Alcohol use: No    Alcohol/week: 0.0 standard drinks of alcohol   Drug use: No   Sexual activity: Not Currently    Birth control/protection: Post-menopausal  Other Topics Concern   Not on file  Social History Narrative   Lives with handicapped son   Caffeine- coffee, 2 cups daily, 1-2 sodas daily   Social Drivers of Health   Tobacco Use: Medium Risk (04/21/2024)   Patient History    Smoking Tobacco Use: Former    Smokeless Tobacco Use: Never    Passive Exposure: Never  Physicist, Medical Strain: Low Risk (04/21/2024)   Overall Financial Resource  Strain (CARDIA)    Difficulty of Paying Living Expenses: Not hard at all  Food Insecurity: No Food Insecurity (04/21/2024)   Epic    Worried About Radiation Protection Practitioner of Food in the Last Year: Never true    Ran Out of Food in the Last Year: Never true  Transportation Needs: No Transportation Needs (04/21/2024)   Epic    Lack of Transportation (Medical): No    Lack of Transportation (Non-Medical): No  Physical Activity: Inactive (04/21/2024)   Exercise Vital Sign    Days of Exercise per Week: 0 days    Minutes of Exercise per Session: 0 min  Stress: No Stress Concern Present (04/21/2024)   [employer] of Occupational Health - Occupational Stress Questionnaire    Feeling of Stress: Not at all  Social Connections: Moderately Isolated (04/21/2024)   Social Connection and Isolation Panel    Frequency of Communication with Friends and Family: More than three times a week    Frequency of Social Gatherings with Friends and Family: More than three times a week    Attends Religious Services: 1 to 4 times per year    Active Member of [employer] or Organizations: No    Attends Banker Meetings: Never    Marital Status: Divorced  Catering Manager Violence: Not At Risk (04/21/2024)   Epic    Fear of Current or Ex-Partner: No    Emotionally Abused: No    Physically Abused: No    Sexually Abused: No  Depression (PHQ2-9): Low Risk (06/05/2024)   Depression (PHQ2-9)    PHQ-2 Score: 0  Alcohol Screen: Low Risk (04/21/2024)   Alcohol Screen    Last Alcohol Screening Score (AUDIT): 0  Housing: Unknown (04/21/2024)   Epic    Unable to Pay for Housing in the Last Year: No    Number of Times Moved in the Last Year: Not on file    Homeless in the Last Year: Not on file  Utilities: Not At Risk (04/21/2024)   Epic    Threatened with loss of utilities: No  Health Literacy: Adequate Health Literacy (04/21/2024)   B1300 Health Literacy    Frequency of need for help with medical instructions:  Never    FAMILY HISTORY:  Family History  Problem Relation Age of Onset   Hypertension Mother    Heart disease Mother    Stroke Mother    Multiple myeloma Father 27   Thyroid  cancer Sister        dx 71's/50's   Cancer Paternal Aunt        unknown   Prostate cancer Paternal Uncle        dx >50- cancer was the cause of his death   Ovarian cancer Paternal Aunt    Throat cancer Paternal Uncle    Breast cancer Cousin        dx >50   Cancer Cousin        type unk   Cancer Cousin        type unk   Cancer Cousin        type unk    Review of Systems  Constitutional:  Negative for appetite change, chills, fatigue, fever and unexpected weight change.  HENT:   Negative for hearing loss, lump/mass and trouble swallowing.   Eyes:  Negative for eye problems and icterus.  Respiratory:  Negative for chest tightness, cough and shortness of breath.   Cardiovascular:  Negative for chest pain, leg swelling and palpitations.  Gastrointestinal:  Negative for abdominal distention, abdominal pain, constipation, diarrhea, nausea and vomiting.  Endocrine: Negative for hot flashes.  Genitourinary:  Negative for difficulty urinating.   Musculoskeletal:  Negative for arthralgias.  Skin:  Negative for itching and rash.  Neurological:  Negative for dizziness, extremity weakness, headaches and numbness.  Hematological:  Negative for adenopathy. Does not bruise/bleed easily.  Psychiatric/Behavioral:  Negative for depression. The patient is not nervous/anxious.       PHYSICAL EXAMINATION   Onc Performance Status - 06/05/24 1100       ECOG Perf Status   ECOG Perf Status Restricted in physically strenuous activity but ambulatory and able to carry out work of a light or sedentary nature, e.g., light house work, office work      KPS SCALE   KPS % SCORE Able to carry on normal activity, minor s/s of disease          Vitals:   06/05/24 1151 06/05/24 1153  BP: (!) 129/42 (!) 144/57  Pulse: (!) 57    Resp: 17   Temp: 97.7 F (36.5 C)   SpO2: 94%     Physical Exam Constitutional:      General: Catherine Lowery is not in acute distress.    Appearance: Normal appearance. Catherine Lowery is not toxic-appearing.  HENT:     Head: Normocephalic and atraumatic.     Mouth/Throat:     Mouth: Mucous membranes are moist.     Pharynx: Oropharynx is clear. No oropharyngeal exudate or posterior oropharyngeal erythema.  Eyes:     General: No scleral icterus. Cardiovascular:     Rate and Rhythm: Normal rate and regular rhythm.     Pulses: Normal pulses.     Heart sounds: Normal heart sounds.  Pulmonary:     Effort: Pulmonary effort is normal.     Breath sounds: Normal breath sounds.  Abdominal:     General: Abdomen is flat. Bowel sounds are normal. There is no distension.     Palpations: Abdomen is soft.     Tenderness: There is no abdominal tenderness.  Musculoskeletal:        General: No swelling.     Cervical back: Neck supple.  Lymphadenopathy:     Cervical: No cervical adenopathy.  Skin:  General: Skin is warm and dry.     Findings: No rash.  Neurological:     General: No focal deficit present.     Mental Status: Catherine Lowery is alert.  Psychiatric:        Mood and Affect: Mood normal.        Behavior: Behavior normal.     LABORATORY DATA:  CBC    Component Value Date/Time   WBC 2.8 (L) 06/05/2024 1140   RBC 3.11 (L) 06/05/2024 1140   HGB 7.9 (L) 06/05/2024 1140   HGB 9.3 (L) 08/13/2023 1010   HCT 23.5 (L) 06/05/2024 1140   PLT 65 (L) 06/05/2024 1140   PLT 122 (L) 08/13/2023 1010   MCV 75.6 (L) 06/05/2024 1140   MCH 25.4 (L) 06/05/2024 1140   MCHC 33.6 06/05/2024 1140   RDW 13.4 06/05/2024 1140   LYMPHSABS PENDING 06/05/2024 1140   MONOABS PENDING 06/05/2024 1140   EOSABS PENDING 06/05/2024 1140   BASOSABS PENDING 06/05/2024 1140    CMP     Component Value Date/Time   NA 138 05/08/2024 1026   NA 139 11/22/2020 0000   K 4.4 05/08/2024 1026   CL 110 05/08/2024 1026   CO2 19 (L)  05/08/2024 1026   GLUCOSE 148 (H) 05/08/2024 1026   BUN 21 05/08/2024 1026   CREATININE 1.07 (H) 05/08/2024 1026   CREATININE 1.05 (H) 05/19/2019 1034   CALCIUM  8.8 (L) 05/08/2024 1026   PROT 8.0 05/08/2024 1026   ALBUMIN  4.0 05/08/2024 1026   AST 49 (H) 05/08/2024 1026   ALT 44 05/08/2024 1026   ALKPHOS 78 05/08/2024 1026   BILITOT 0.3 05/08/2024 1026   GFRNONAA 52 (L) 05/08/2024 1026   GFRNONAA 48 (L) 01/23/2018 0804   GFRAA >60 06/23/2019 1524   GFRAA 56 (L) 01/23/2018 0804     ASSESSMENT and THERAPY PLAN:    Assessment & Plan  Metastatic estrogen receptor positive breast cancer with bone and lung metastases Metastatic ER+ breast cancer with bone and lung metastases, treated with palbociclib  and fulvestrant . PET scan shows improvement, and Catherine Lowery is clinically improved. Current regimen remains effective. - Scheduled next fulvestrant  injection and follow-up appointments. - Continued palbociclib  (100 mg) and Enablasib as previously prescribed. - Planned PET scan every three months to monitor disease status. - Monitor tumor markers as adjunctive data.  Drug-induced hyperglycemia due to cancer therapy Hyperglycemia attributed to inavolisib  with A1c increasing from 5 to 7.5 over two months. Hyperglycemia is likely reversible if therapy is discontinued, but the current regimen is effective. Benefit of continued therapy outweighs hyperglycemia risk; manage diabetes per standard care. - Recommended management of hyperglycemia per standard diabetes care. - Advised her to coordinate diabetes management with her primary care provider. - Continue current cancer therapy given its efficacy.  Leukopenia and thrombocytopenia secondary to cancer therapy Mild leukopenia, expected at this point in her palbociclib  cycle. Anticipated effect of therapy requiring ongoing monitoring. - Scheduled CBC for February 12th prior to next cycle. - Will contact her with instructions to restart or hold  palbociclib  based on laboratory results.  All questions were answered. The patient knows to call the clinic with any problems, questions or concerns. We can certainly see the patient much sooner if necessary.  Total encounter time:37minutes*in face-to-face visit time, chart review, lab review, care coordination, order entry, and documentation of the encounter time.  *Total Encounter Time as defined by the Centers for Medicare and Medicaid Services includes, in addition to the face-to-face time of a patient  visit (documented in the note above) non-face-to-face time: obtaining and reviewing outside history, ordering and reviewing medications, tests or procedures, care coordination (communications with other health care professionals or caregivers) and documentation in the medical record.

## 2024-06-06 ENCOUNTER — Other Ambulatory Visit: Payer: Self-pay

## 2024-06-06 ENCOUNTER — Other Ambulatory Visit (HOSPITAL_COMMUNITY): Payer: Self-pay

## 2024-06-06 NOTE — Progress Notes (Signed)
 Specialty Pharmacy Refill Coordination Note  Catherine Lowery is a 79 y.o. female contacted today regarding refills of specialty medication(s) Inavolisib  (ITOVEBI ); [name]  (IBRANCE )   Patient requested Delivery   Delivery date: 06/10/24   Verified address: [address]  [name] [address]   Medication will be filled on: 06/09/24

## 2024-06-12 ENCOUNTER — Inpatient Hospital Stay

## 2024-06-12 ENCOUNTER — Inpatient Hospital Stay: Admitting: Hematology and Oncology

## 2024-06-16 ENCOUNTER — Inpatient Hospital Stay: Admitting: Psychiatry

## 2024-06-16 ENCOUNTER — Inpatient Hospital Stay

## 2024-06-30 ENCOUNTER — Inpatient Hospital Stay: Attending: Psychiatry | Admitting: Psychiatry

## 2024-07-15 ENCOUNTER — Ambulatory Visit: Admitting: Acute Care
